# Patient Record
Sex: Female | Born: 1955
Health system: Southern US, Community
[De-identification: ages and names within clinical notes are randomized; demographics above are authoritative.]

## PROBLEM LIST (undated history)

## (undated) DIAGNOSIS — I10 Essential (primary) hypertension: Secondary | ICD-10-CM

## (undated) DIAGNOSIS — E785 Hyperlipidemia, unspecified: Secondary | ICD-10-CM

## (undated) DIAGNOSIS — I70229 Atherosclerosis of native arteries of extremities with rest pain, unspecified extremity: Secondary | ICD-10-CM

## (undated) DIAGNOSIS — Z681 Body mass index (BMI) 19 or less, adult: Secondary | ICD-10-CM

## (undated) HISTORY — DX: Essential (primary) hypertension: I10

## (undated) HISTORY — DX: Hyperlipidemia, unspecified: E78.5

## (undated) HISTORY — DX: Atherosclerosis of native arteries of extremities with rest pain, unspecified extremity: I70.229

## (undated) HISTORY — DX: Body mass index (BMI) 19.9 or less, adult: Z68.1

## (undated) HISTORY — PX: BRAIN SURGERY: SHX531

## (undated) HISTORY — PX: CEREBRAL ANEURYSM REPAIR: SHX164

---

## 1988-01-04 HISTORY — PX: TUBAL LIGATION: SHX77

## 2012-01-04 HISTORY — PX: BREAST SURGERY: SHX581

## 2012-03-21 DIAGNOSIS — R928 Other abnormal and inconclusive findings on diagnostic imaging of breast: Secondary | ICD-10-CM | POA: Diagnosis not present

## 2012-03-28 DIAGNOSIS — N63 Unspecified lump in unspecified breast: Secondary | ICD-10-CM | POA: Diagnosis not present

## 2012-03-30 DIAGNOSIS — R928 Other abnormal and inconclusive findings on diagnostic imaging of breast: Secondary | ICD-10-CM | POA: Diagnosis not present

## 2012-04-04 DIAGNOSIS — R920 Mammographic microcalcification found on diagnostic imaging of breast: Secondary | ICD-10-CM | POA: Diagnosis not present

## 2012-04-09 DIAGNOSIS — N6089 Other benign mammary dysplasias of unspecified breast: Secondary | ICD-10-CM | POA: Diagnosis not present

## 2012-04-09 DIAGNOSIS — I10 Essential (primary) hypertension: Secondary | ICD-10-CM | POA: Diagnosis not present

## 2012-04-09 DIAGNOSIS — R9431 Abnormal electrocardiogram [ECG] [EKG]: Secondary | ICD-10-CM | POA: Diagnosis not present

## 2012-04-09 DIAGNOSIS — N6019 Diffuse cystic mastopathy of unspecified breast: Secondary | ICD-10-CM | POA: Diagnosis not present

## 2012-04-09 DIAGNOSIS — R928 Other abnormal and inconclusive findings on diagnostic imaging of breast: Secondary | ICD-10-CM | POA: Diagnosis not present

## 2012-04-09 DIAGNOSIS — D249 Benign neoplasm of unspecified breast: Secondary | ICD-10-CM | POA: Diagnosis not present

## 2012-04-09 DIAGNOSIS — F17200 Nicotine dependence, unspecified, uncomplicated: Secondary | ICD-10-CM | POA: Diagnosis not present

## 2012-04-09 DIAGNOSIS — Z79899 Other long term (current) drug therapy: Secondary | ICD-10-CM | POA: Diagnosis not present

## 2012-04-09 DIAGNOSIS — R920 Mammographic microcalcification found on diagnostic imaging of breast: Secondary | ICD-10-CM | POA: Diagnosis not present

## 2012-05-24 DIAGNOSIS — I10 Essential (primary) hypertension: Secondary | ICD-10-CM | POA: Diagnosis not present

## 2012-06-22 DIAGNOSIS — H2589 Other age-related cataract: Secondary | ICD-10-CM | POA: Diagnosis not present

## 2012-09-25 DIAGNOSIS — R209 Unspecified disturbances of skin sensation: Secondary | ICD-10-CM | POA: Diagnosis not present

## 2012-09-25 DIAGNOSIS — M129 Arthropathy, unspecified: Secondary | ICD-10-CM | POA: Diagnosis not present

## 2012-09-25 DIAGNOSIS — I10 Essential (primary) hypertension: Secondary | ICD-10-CM | POA: Diagnosis not present

## 2012-10-04 DIAGNOSIS — S43109A Unspecified dislocation of unspecified acromioclavicular joint, initial encounter: Secondary | ICD-10-CM | POA: Diagnosis not present

## 2012-10-10 DIAGNOSIS — S43109A Unspecified dislocation of unspecified acromioclavicular joint, initial encounter: Secondary | ICD-10-CM | POA: Diagnosis not present

## 2012-10-17 DIAGNOSIS — S43109A Unspecified dislocation of unspecified acromioclavicular joint, initial encounter: Secondary | ICD-10-CM | POA: Diagnosis not present

## 2012-10-23 DIAGNOSIS — S43109A Unspecified dislocation of unspecified acromioclavicular joint, initial encounter: Secondary | ICD-10-CM | POA: Diagnosis not present

## 2012-10-26 DIAGNOSIS — S43109A Unspecified dislocation of unspecified acromioclavicular joint, initial encounter: Secondary | ICD-10-CM | POA: Diagnosis not present

## 2012-10-30 DIAGNOSIS — S43109A Unspecified dislocation of unspecified acromioclavicular joint, initial encounter: Secondary | ICD-10-CM | POA: Diagnosis not present

## 2012-11-01 DIAGNOSIS — S43109A Unspecified dislocation of unspecified acromioclavicular joint, initial encounter: Secondary | ICD-10-CM | POA: Diagnosis not present

## 2012-11-06 DIAGNOSIS — S43109A Unspecified dislocation of unspecified acromioclavicular joint, initial encounter: Secondary | ICD-10-CM | POA: Diagnosis not present

## 2012-11-08 DIAGNOSIS — S43109A Unspecified dislocation of unspecified acromioclavicular joint, initial encounter: Secondary | ICD-10-CM | POA: Diagnosis not present

## 2012-11-14 DIAGNOSIS — S43109A Unspecified dislocation of unspecified acromioclavicular joint, initial encounter: Secondary | ICD-10-CM | POA: Diagnosis not present

## 2012-11-15 DIAGNOSIS — L723 Sebaceous cyst: Secondary | ICD-10-CM | POA: Diagnosis not present

## 2012-11-15 DIAGNOSIS — I10 Essential (primary) hypertension: Secondary | ICD-10-CM | POA: Diagnosis not present

## 2012-11-16 DIAGNOSIS — S43109A Unspecified dislocation of unspecified acromioclavicular joint, initial encounter: Secondary | ICD-10-CM | POA: Diagnosis not present

## 2012-11-19 DIAGNOSIS — S43109A Unspecified dislocation of unspecified acromioclavicular joint, initial encounter: Secondary | ICD-10-CM | POA: Diagnosis not present

## 2012-11-22 DIAGNOSIS — M7989 Other specified soft tissue disorders: Secondary | ICD-10-CM | POA: Diagnosis not present

## 2012-11-26 DIAGNOSIS — R229 Localized swelling, mass and lump, unspecified: Secondary | ICD-10-CM | POA: Diagnosis not present

## 2012-11-26 DIAGNOSIS — M79609 Pain in unspecified limb: Secondary | ICD-10-CM | POA: Diagnosis not present

## 2012-12-03 DIAGNOSIS — L723 Sebaceous cyst: Secondary | ICD-10-CM | POA: Diagnosis not present

## 2012-12-03 DIAGNOSIS — M79609 Pain in unspecified limb: Secondary | ICD-10-CM | POA: Diagnosis not present

## 2012-12-03 DIAGNOSIS — I10 Essential (primary) hypertension: Secondary | ICD-10-CM | POA: Diagnosis not present

## 2012-12-03 DIAGNOSIS — M7989 Other specified soft tissue disorders: Secondary | ICD-10-CM | POA: Diagnosis not present

## 2012-12-03 DIAGNOSIS — D213 Benign neoplasm of connective and other soft tissue of thorax: Secondary | ICD-10-CM | POA: Diagnosis not present

## 2012-12-03 DIAGNOSIS — Z79899 Other long term (current) drug therapy: Secondary | ICD-10-CM | POA: Diagnosis not present

## 2012-12-03 DIAGNOSIS — F17200 Nicotine dependence, unspecified, uncomplicated: Secondary | ICD-10-CM | POA: Diagnosis not present

## 2012-12-03 DIAGNOSIS — R229 Localized swelling, mass and lump, unspecified: Secondary | ICD-10-CM | POA: Diagnosis not present

## 2012-12-03 DIAGNOSIS — Z853 Personal history of malignant neoplasm of breast: Secondary | ICD-10-CM | POA: Diagnosis not present

## 2013-01-01 DIAGNOSIS — M129 Arthropathy, unspecified: Secondary | ICD-10-CM | POA: Diagnosis not present

## 2013-01-01 DIAGNOSIS — I10 Essential (primary) hypertension: Secondary | ICD-10-CM | POA: Diagnosis not present

## 2013-01-01 DIAGNOSIS — Z79899 Other long term (current) drug therapy: Secondary | ICD-10-CM | POA: Diagnosis not present

## 2013-04-02 DIAGNOSIS — R0602 Shortness of breath: Secondary | ICD-10-CM | POA: Diagnosis not present

## 2013-04-02 DIAGNOSIS — IMO0002 Reserved for concepts with insufficient information to code with codable children: Secondary | ICD-10-CM | POA: Diagnosis not present

## 2013-04-02 DIAGNOSIS — M25569 Pain in unspecified knee: Secondary | ICD-10-CM | POA: Diagnosis not present

## 2013-04-02 DIAGNOSIS — E782 Mixed hyperlipidemia: Secondary | ICD-10-CM | POA: Diagnosis not present

## 2013-04-02 DIAGNOSIS — I10 Essential (primary) hypertension: Secondary | ICD-10-CM | POA: Diagnosis not present

## 2013-04-02 DIAGNOSIS — Z79899 Other long term (current) drug therapy: Secondary | ICD-10-CM | POA: Diagnosis not present

## 2013-04-02 DIAGNOSIS — R5381 Other malaise: Secondary | ICD-10-CM | POA: Diagnosis not present

## 2013-04-16 DIAGNOSIS — S31809A Unspecified open wound of unspecified buttock, initial encounter: Secondary | ICD-10-CM | POA: Diagnosis not present

## 2013-04-16 DIAGNOSIS — M25569 Pain in unspecified knee: Secondary | ICD-10-CM | POA: Diagnosis not present

## 2013-04-16 DIAGNOSIS — M1710 Unilateral primary osteoarthritis, unspecified knee: Secondary | ICD-10-CM | POA: Diagnosis not present

## 2013-04-16 DIAGNOSIS — IMO0002 Reserved for concepts with insufficient information to code with codable children: Secondary | ICD-10-CM | POA: Diagnosis not present

## 2013-04-16 DIAGNOSIS — S83509A Sprain of unspecified cruciate ligament of unspecified knee, initial encounter: Secondary | ICD-10-CM | POA: Diagnosis not present

## 2013-04-17 DIAGNOSIS — M949 Disorder of cartilage, unspecified: Secondary | ICD-10-CM | POA: Diagnosis not present

## 2013-04-17 DIAGNOSIS — M899 Disorder of bone, unspecified: Secondary | ICD-10-CM | POA: Diagnosis not present

## 2013-04-17 DIAGNOSIS — Z13820 Encounter for screening for osteoporosis: Secondary | ICD-10-CM | POA: Diagnosis not present

## 2013-04-17 DIAGNOSIS — Z9889 Other specified postprocedural states: Secondary | ICD-10-CM | POA: Diagnosis not present

## 2013-04-17 DIAGNOSIS — R928 Other abnormal and inconclusive findings on diagnostic imaging of breast: Secondary | ICD-10-CM | POA: Diagnosis not present

## 2013-04-17 DIAGNOSIS — R922 Inconclusive mammogram: Secondary | ICD-10-CM | POA: Diagnosis not present

## 2013-04-24 DIAGNOSIS — Z1239 Encounter for other screening for malignant neoplasm of breast: Secondary | ICD-10-CM | POA: Diagnosis not present

## 2013-05-02 DIAGNOSIS — S83509A Sprain of unspecified cruciate ligament of unspecified knee, initial encounter: Secondary | ICD-10-CM | POA: Diagnosis not present

## 2013-05-02 DIAGNOSIS — M25569 Pain in unspecified knee: Secondary | ICD-10-CM | POA: Diagnosis not present

## 2013-05-02 DIAGNOSIS — R269 Unspecified abnormalities of gait and mobility: Secondary | ICD-10-CM | POA: Diagnosis not present

## 2013-05-06 DIAGNOSIS — S83509A Sprain of unspecified cruciate ligament of unspecified knee, initial encounter: Secondary | ICD-10-CM | POA: Diagnosis not present

## 2013-05-06 DIAGNOSIS — R269 Unspecified abnormalities of gait and mobility: Secondary | ICD-10-CM | POA: Diagnosis not present

## 2013-05-06 DIAGNOSIS — M25569 Pain in unspecified knee: Secondary | ICD-10-CM | POA: Diagnosis not present

## 2013-05-08 DIAGNOSIS — M25569 Pain in unspecified knee: Secondary | ICD-10-CM | POA: Diagnosis not present

## 2013-05-08 DIAGNOSIS — R269 Unspecified abnormalities of gait and mobility: Secondary | ICD-10-CM | POA: Diagnosis not present

## 2013-05-08 DIAGNOSIS — S83509A Sprain of unspecified cruciate ligament of unspecified knee, initial encounter: Secondary | ICD-10-CM | POA: Diagnosis not present

## 2013-05-15 DIAGNOSIS — M25569 Pain in unspecified knee: Secondary | ICD-10-CM | POA: Diagnosis not present

## 2013-05-15 DIAGNOSIS — S83509A Sprain of unspecified cruciate ligament of unspecified knee, initial encounter: Secondary | ICD-10-CM | POA: Diagnosis not present

## 2013-05-15 DIAGNOSIS — R269 Unspecified abnormalities of gait and mobility: Secondary | ICD-10-CM | POA: Diagnosis not present

## 2013-05-17 DIAGNOSIS — S83509A Sprain of unspecified cruciate ligament of unspecified knee, initial encounter: Secondary | ICD-10-CM | POA: Diagnosis not present

## 2013-05-17 DIAGNOSIS — M25569 Pain in unspecified knee: Secondary | ICD-10-CM | POA: Diagnosis not present

## 2013-05-17 DIAGNOSIS — R269 Unspecified abnormalities of gait and mobility: Secondary | ICD-10-CM | POA: Diagnosis not present

## 2013-05-22 DIAGNOSIS — R269 Unspecified abnormalities of gait and mobility: Secondary | ICD-10-CM | POA: Diagnosis not present

## 2013-05-22 DIAGNOSIS — S83509A Sprain of unspecified cruciate ligament of unspecified knee, initial encounter: Secondary | ICD-10-CM | POA: Diagnosis not present

## 2013-05-22 DIAGNOSIS — M25569 Pain in unspecified knee: Secondary | ICD-10-CM | POA: Diagnosis not present

## 2013-05-24 DIAGNOSIS — M25569 Pain in unspecified knee: Secondary | ICD-10-CM | POA: Diagnosis not present

## 2013-05-24 DIAGNOSIS — R269 Unspecified abnormalities of gait and mobility: Secondary | ICD-10-CM | POA: Diagnosis not present

## 2013-05-24 DIAGNOSIS — S83509A Sprain of unspecified cruciate ligament of unspecified knee, initial encounter: Secondary | ICD-10-CM | POA: Diagnosis not present

## 2013-05-29 DIAGNOSIS — S83509A Sprain of unspecified cruciate ligament of unspecified knee, initial encounter: Secondary | ICD-10-CM | POA: Diagnosis not present

## 2013-05-29 DIAGNOSIS — S83419A Sprain of medial collateral ligament of unspecified knee, initial encounter: Secondary | ICD-10-CM | POA: Diagnosis not present

## 2013-05-29 DIAGNOSIS — M1990 Unspecified osteoarthritis, unspecified site: Secondary | ICD-10-CM | POA: Diagnosis not present

## 2013-05-30 DIAGNOSIS — M25569 Pain in unspecified knee: Secondary | ICD-10-CM | POA: Diagnosis not present

## 2013-05-30 DIAGNOSIS — R269 Unspecified abnormalities of gait and mobility: Secondary | ICD-10-CM | POA: Diagnosis not present

## 2013-05-30 DIAGNOSIS — S83509A Sprain of unspecified cruciate ligament of unspecified knee, initial encounter: Secondary | ICD-10-CM | POA: Diagnosis not present

## 2013-06-03 DIAGNOSIS — R269 Unspecified abnormalities of gait and mobility: Secondary | ICD-10-CM | POA: Diagnosis not present

## 2013-06-03 DIAGNOSIS — M25569 Pain in unspecified knee: Secondary | ICD-10-CM | POA: Diagnosis not present

## 2013-06-03 DIAGNOSIS — S83509A Sprain of unspecified cruciate ligament of unspecified knee, initial encounter: Secondary | ICD-10-CM | POA: Diagnosis not present

## 2013-06-05 DIAGNOSIS — M25569 Pain in unspecified knee: Secondary | ICD-10-CM | POA: Diagnosis not present

## 2013-06-05 DIAGNOSIS — R269 Unspecified abnormalities of gait and mobility: Secondary | ICD-10-CM | POA: Diagnosis not present

## 2013-06-05 DIAGNOSIS — S83509A Sprain of unspecified cruciate ligament of unspecified knee, initial encounter: Secondary | ICD-10-CM | POA: Diagnosis not present

## 2013-06-11 DIAGNOSIS — S83509A Sprain of unspecified cruciate ligament of unspecified knee, initial encounter: Secondary | ICD-10-CM | POA: Diagnosis not present

## 2013-06-11 DIAGNOSIS — R269 Unspecified abnormalities of gait and mobility: Secondary | ICD-10-CM | POA: Diagnosis not present

## 2013-06-11 DIAGNOSIS — M25569 Pain in unspecified knee: Secondary | ICD-10-CM | POA: Diagnosis not present

## 2013-06-14 DIAGNOSIS — M25569 Pain in unspecified knee: Secondary | ICD-10-CM | POA: Diagnosis not present

## 2013-06-14 DIAGNOSIS — R269 Unspecified abnormalities of gait and mobility: Secondary | ICD-10-CM | POA: Diagnosis not present

## 2013-06-14 DIAGNOSIS — S83509A Sprain of unspecified cruciate ligament of unspecified knee, initial encounter: Secondary | ICD-10-CM | POA: Diagnosis not present

## 2013-06-18 DIAGNOSIS — R269 Unspecified abnormalities of gait and mobility: Secondary | ICD-10-CM | POA: Diagnosis not present

## 2013-06-18 DIAGNOSIS — S83509A Sprain of unspecified cruciate ligament of unspecified knee, initial encounter: Secondary | ICD-10-CM | POA: Diagnosis not present

## 2013-06-18 DIAGNOSIS — M25569 Pain in unspecified knee: Secondary | ICD-10-CM | POA: Diagnosis not present

## 2013-06-27 DIAGNOSIS — M25569 Pain in unspecified knee: Secondary | ICD-10-CM | POA: Diagnosis not present

## 2013-06-27 DIAGNOSIS — R269 Unspecified abnormalities of gait and mobility: Secondary | ICD-10-CM | POA: Diagnosis not present

## 2013-06-27 DIAGNOSIS — S83509A Sprain of unspecified cruciate ligament of unspecified knee, initial encounter: Secondary | ICD-10-CM | POA: Diagnosis not present

## 2013-07-02 DIAGNOSIS — Z79899 Other long term (current) drug therapy: Secondary | ICD-10-CM | POA: Diagnosis not present

## 2013-07-02 DIAGNOSIS — I10 Essential (primary) hypertension: Secondary | ICD-10-CM | POA: Diagnosis not present

## 2013-07-02 DIAGNOSIS — M129 Arthropathy, unspecified: Secondary | ICD-10-CM | POA: Diagnosis not present

## 2013-07-02 DIAGNOSIS — R0602 Shortness of breath: Secondary | ICD-10-CM | POA: Diagnosis not present

## 2013-07-02 DIAGNOSIS — E782 Mixed hyperlipidemia: Secondary | ICD-10-CM | POA: Diagnosis not present

## 2013-07-02 DIAGNOSIS — F17200 Nicotine dependence, unspecified, uncomplicated: Secondary | ICD-10-CM | POA: Diagnosis not present

## 2013-07-03 DIAGNOSIS — H524 Presbyopia: Secondary | ICD-10-CM | POA: Diagnosis not present

## 2013-07-03 DIAGNOSIS — H2589 Other age-related cataract: Secondary | ICD-10-CM | POA: Diagnosis not present

## 2013-08-29 DIAGNOSIS — S83419A Sprain of medial collateral ligament of unspecified knee, initial encounter: Secondary | ICD-10-CM | POA: Diagnosis not present

## 2013-08-29 DIAGNOSIS — M1990 Unspecified osteoarthritis, unspecified site: Secondary | ICD-10-CM | POA: Diagnosis not present

## 2013-08-29 DIAGNOSIS — S83509A Sprain of unspecified cruciate ligament of unspecified knee, initial encounter: Secondary | ICD-10-CM | POA: Diagnosis not present

## 2013-10-02 DIAGNOSIS — Z79899 Other long term (current) drug therapy: Secondary | ICD-10-CM | POA: Diagnosis not present

## 2013-10-02 DIAGNOSIS — R0602 Shortness of breath: Secondary | ICD-10-CM | POA: Diagnosis not present

## 2013-10-02 DIAGNOSIS — I10 Essential (primary) hypertension: Secondary | ICD-10-CM | POA: Diagnosis not present

## 2013-10-02 DIAGNOSIS — L259 Unspecified contact dermatitis, unspecified cause: Secondary | ICD-10-CM | POA: Diagnosis not present

## 2013-10-02 DIAGNOSIS — E782 Mixed hyperlipidemia: Secondary | ICD-10-CM | POA: Diagnosis not present

## 2013-10-28 DIAGNOSIS — I10 Essential (primary) hypertension: Secondary | ICD-10-CM | POA: Diagnosis not present

## 2013-10-28 DIAGNOSIS — L209 Atopic dermatitis, unspecified: Secondary | ICD-10-CM | POA: Diagnosis not present

## 2013-10-28 DIAGNOSIS — Z720 Tobacco use: Secondary | ICD-10-CM | POA: Diagnosis not present

## 2013-10-28 DIAGNOSIS — E780 Pure hypercholesterolemia: Secondary | ICD-10-CM | POA: Diagnosis not present

## 2013-10-28 DIAGNOSIS — Z79899 Other long term (current) drug therapy: Secondary | ICD-10-CM | POA: Diagnosis not present

## 2013-11-05 ENCOUNTER — Encounter (HOSPITAL_COMMUNITY): Payer: Self-pay | Admitting: Emergency Medicine

## 2013-11-05 ENCOUNTER — Emergency Department (HOSPITAL_COMMUNITY): Payer: Medicare Other

## 2013-11-05 ENCOUNTER — Emergency Department (HOSPITAL_COMMUNITY)
Admission: EM | Admit: 2013-11-05 | Discharge: 2013-11-05 | Disposition: A | Discharge status: Home / Self Care | Payer: Medicare Other | Attending: Emergency Medicine | Admitting: Emergency Medicine

## 2013-11-05 DIAGNOSIS — R220 Localized swelling, mass and lump, head: Secondary | ICD-10-CM | POA: Diagnosis not present

## 2013-11-05 DIAGNOSIS — H05223 Edema of bilateral orbit: Secondary | ICD-10-CM | POA: Diagnosis not present

## 2013-11-05 DIAGNOSIS — R51 Headache: Secondary | ICD-10-CM | POA: Diagnosis not present

## 2013-11-05 DIAGNOSIS — H5789 Other specified disorders of eye and adnexa: Secondary | ICD-10-CM

## 2013-11-05 DIAGNOSIS — R519 Headache, unspecified: Secondary | ICD-10-CM

## 2013-11-05 DIAGNOSIS — H0589 Other disorders of orbit: Secondary | ICD-10-CM | POA: Diagnosis not present

## 2013-11-05 LAB — CBC WITH DIFFERENTIAL/PLATELET
BASOS PCT: 0 % (ref 0–1)
Basophils Absolute: 0 10*3/uL (ref 0.0–0.1)
Eosinophils Absolute: 0.1 10*3/uL (ref 0.0–0.7)
Eosinophils Relative: 1 % (ref 0–5)
HCT: 43.4 % (ref 36.0–46.0)
Hemoglobin: 14.8 g/dL (ref 12.0–15.0)
LYMPHS ABS: 1.9 10*3/uL (ref 0.7–4.0)
Lymphocytes Relative: 28 % (ref 12–46)
MCH: 31.7 pg (ref 26.0–34.0)
MCHC: 34.1 g/dL (ref 30.0–36.0)
MCV: 92.9 fL (ref 78.0–100.0)
Monocytes Absolute: 0.7 10*3/uL (ref 0.1–1.0)
Monocytes Relative: 11 % (ref 3–12)
NEUTROS PCT: 60 % (ref 43–77)
Neutro Abs: 3.9 10*3/uL (ref 1.7–7.7)
PLATELETS: 160 10*3/uL (ref 150–400)
RBC: 4.67 MIL/uL (ref 3.87–5.11)
RDW: 12.2 % (ref 11.5–15.5)
WBC: 6.6 10*3/uL (ref 4.0–10.5)

## 2013-11-05 LAB — BASIC METABOLIC PANEL
Anion gap: 12 (ref 5–15)
BUN: 8 mg/dL (ref 6–23)
CO2: 25 mEq/L (ref 19–32)
Calcium: 9.8 mg/dL (ref 8.4–10.5)
Chloride: 103 mEq/L (ref 96–112)
Creatinine, Ser: 0.75 mg/dL (ref 0.50–1.10)
GFR calc Af Amer: >90 mL/min (ref >90)
Glucose, Bld: 98 mg/dL (ref 70–99)
Potassium: 4 mEq/L (ref 3.7–5.3)
SODIUM: 140 meq/L (ref 137–147)

## 2013-11-05 NOTE — ED Provider Notes (Signed)
CSN: [id number]     Arrival date & time 11/05/13  1051 History  This chart was scribed for non-physician practitioner, Alvina Chou, PA-C, working with Sharyon Cable, MD by Lowella Petties, ED Scribe. The patient was seen in room TR11C/TR11C. Patient's care was started at 11:16 AM.   Chief Complaint  Patient presents with  . Facial Swelling   The history is provided by the patient and a relative. No language interpreter was used.   HPI Comments: Katherine Archer is a 58 y.o. female who presents to the Emergency Department complaining of periorbital facial swelling onset two months ago. She states that the swelling around her eyes and cheeks becomes increasingly swollen to the point of purple discoloration, then ruptures leaking blood. She reports associated left-sided headaches. She has seen her PCP multiple times for this over the past two months and has taken various ABX targeted at a dermatologic cause. She has taken this medication as prescribed without relief. She denies any agrivating symptoms. She denies vision changes, numbness, or tingling. She reports a history of a brain aneurysm, and her daughter states that she has a metal plate in her brain from previous surgery.   No past medical history on file. No past surgical history on file. No family history on file. History  Substance Use Topics  . Smoking status: Not on file  . Smokeless tobacco: Not on file  . Alcohol Use: Not on file   OB History    No data available     Review of Systems  HENT: Positive for facial swelling (periorbital).   Skin: Positive for color change (periorbital).  All other systems reviewed and are negative.   Allergies  Review of patient's allergies indicates not on file.  Home Medications   Prior to Admission medications   Not on File   Triage Vitals: BP 148/78 mmHg  Pulse 92  Temp(Src) 98.1 F (36.7 C) (Oral)  Resp 17  Ht 5\' 6"  (1.676 m)  Wt 128 lb (58.06 kg)  BMI 20.67 kg/m2  SpO2  95% Physical Exam  Constitutional: She is oriented to person, place, and time. She appears well-developed and well-nourished. No distress.  HENT:  Head: Normocephalic and atraumatic.  Mouth/Throat: Oropharynx is clear and moist. No oropharyngeal exudate.  Bilateral periorbital swelling that extends along zygomatic processes that is fluctuant and mild tender to palpation. No open wound or drainage noted. There is mild purplish, hyperpigmentation discoloration of the associated skin. Patient reports when I palpate the left periorbital area, her left temporal head aches.   Eyes: Conjunctivae and EOM are normal. Pupils are equal, round, and reactive to light.  Neck: Normal range of motion.  Cardiovascular: Normal rate and regular rhythm.  Exam reveals no gallop and no friction rub.   No murmur heard. Pulmonary/Chest: Effort normal and breath sounds normal. She has no wheezes. She has no rales. She exhibits no tenderness.  Abdominal: Soft. There is no tenderness.  Musculoskeletal: Normal range of motion.  Neurological: She is alert and oriented to person, place, and time. No cranial nerve deficit. Coordination normal.  Extremity strength and sensation equal and intact bilaterally. Patient is able to ambulate without difficulty. Speech is goal-oriented. Moves limbs without ataxia.   Skin: Skin is warm and dry.  Psychiatric: She has a normal mood and affect. Her behavior is normal.  Nursing note and vitals reviewed.   ED Course  Procedures (including critical care time) DIAGNOSTIC STUDIES: Oxygen Saturation is 95% on room air, adequate  by my interpretation.    COORDINATION OF CARE: 11:21 AM-Discussed treatment plan which includes head CT-scan and lab work with pt at bedside and pt agreed to plan.   Labs Review Labs Reviewed  CBC WITH DIFFERENTIAL  BASIC METABOLIC PANEL    Imaging Review Ct Head Wo Contrast  11/05/2013   CLINICAL DATA:  Periorbital swelling for 2 months without response  to antibiotics. Left-sided headaches. Personal history of cerebral aneurysm repair 1998.  EXAM: CT HEAD WITHOUT CONTRAST  CT MAXILLOFACIAL WITHOUT CONTRAST  TECHNIQUE: Multidetector CT imaging of the head and maxillofacial structures were performed using the standard protocol without intravenous contrast. Multiplanar CT image reconstructions of the maxillofacial structures were also generated.  COMPARISON:  None.  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 Encephalomalacia is noted in the left frontal lobe. No acute cortical infarct, hemorrhage, or mass lesion is present. The ventricles are of normal size with some ex vacuo dilation of the frontal horn left lateral ventricle.  Mild mucosal thickening is scattered through the ethmoid air cells. The paranasal sinuses and mastoid air cells are otherwise clear.  CT MAXILLOFACIAL FINDINGS  Mild soft tissue swelling is present in the upper eyelids bilaterally. There is some infraorbital soft tissue swelling is well. There is no mass lesion. The globes and orbits are intact. The postseptal orbits are within normal limits bilaterally. The lacrimal gland is within normal limits bilaterally as well.  Degenerative changes are present in the upper cervical spine with the endplate change in uncovertebral disease bilaterally at C4-5 and left greater than right at C3-4. Ankle oasis is evident at C6-7.  IMPRESSION: 1. Preseptal periorbital soft tissue swelling is noted bilaterally without a discrete abscess or focal etiology. 2. The orbits are otherwise normal. 3. Encephalomalacia of the left frontal lobe is likely remote in associated with the craniotomy for aneurysm clipping. 4. No acute intracranial abnormality. 5. Degenerative changes of the upper cervical spine.   Electronically Signed   By: Lawrence Santiago M.D.   On:  11/05/2013 12:05   Ct Maxillofacial Wo Cm  11/05/2013   CLINICAL DATA:  Periorbital swelling for 2 months without response to antibiotics. Left-sided headaches. Personal history of cerebral aneurysm repair 1998.  EXAM: CT HEAD WITHOUT CONTRAST  CT MAXILLOFACIAL WITHOUT CONTRAST  TECHNIQUE: Multidetector CT imaging of the head and maxillofacial structures were performed using the standard protocol without intravenous contrast. Multiplanar CT image reconstructions of the maxillofacial structures were also generated.  COMPARISON:  None.  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 Encephalomalacia is noted in the left frontal lobe. No acute cortical infarct, hemorrhage, or mass lesion is present. The ventricles are of normal size with some ex vacuo dilation of the frontal horn left lateral ventricle.  Mild mucosal thickening is scattered through the ethmoid air cells. The paranasal sinuses and mastoid air cells are otherwise clear.  CT MAXILLOFACIAL FINDINGS  Mild soft tissue swelling is present in the upper eyelids bilaterally. There is some infraorbital soft tissue swelling is well. There is no mass lesion. The globes and orbits are intact. The postseptal orbits are within normal limits bilaterally. The lacrimal gland is within normal limits bilaterally as well.  Degenerative changes are present in the upper cervical spine  with the endplate change in uncovertebral disease bilaterally at C4-5 and left greater than right at C3-4. Ankle oasis is evident at C6-7.  IMPRESSION: 1. Preseptal periorbital soft tissue swelling is noted bilaterally without a discrete abscess or focal etiology. 2. The orbits are otherwise normal. 3. Encephalomalacia of the left frontal lobe is likely remote in associated with the craniotomy for aneurysm clipping. 4. No acute intracranial  abnormality. 5. Degenerative changes of the upper cervical spine.   Electronically Signed   By: Lawrence Santiago M.D.   On: 11/05/2013 12:05     EKG Interpretation None     MDM   Final diagnoses:  Periorbital swelling    12:48 PM Patient has no neuro deficits at this time. Patient's head and face CT show no acute changes. Labs unremarkable. Patient's symptoms likely dermatologic. I will refer the patient to dermatologist. Vitals stable and patient afebrile.   I personally performed the services described in this documentation, which was scribed in my presence. The recorded information has been reviewed and is accurate.   Alvina Chou, PA-C 11/05/13 1249

## 2013-11-05 NOTE — ED Notes (Signed)
Pt c/o periorbital and facial swelling x 2 months. Has seen PCP about this and given abx but no improvement. States facial lesions "swell up and burst, draining blood". Daughter demanding "blood be drawn".

## 2013-11-05 NOTE — Discharge Instructions (Signed)
Follow up with the recommended Dermatologist for further evaluation of your facial swelling. Return to the ED with worsening or concerning symptoms.

## 2013-11-15 DIAGNOSIS — L300 Nummular dermatitis: Secondary | ICD-10-CM | POA: Diagnosis not present

## 2013-11-15 DIAGNOSIS — H01114 Allergic dermatitis of left upper eyelid: Secondary | ICD-10-CM | POA: Diagnosis not present

## 2013-11-15 DIAGNOSIS — H01111 Allergic dermatitis of right upper eyelid: Secondary | ICD-10-CM | POA: Diagnosis not present

## 2013-11-15 DIAGNOSIS — L299 Pruritus, unspecified: Secondary | ICD-10-CM | POA: Diagnosis not present

## 2013-11-25 DIAGNOSIS — M1712 Unilateral primary osteoarthritis, left knee: Secondary | ICD-10-CM | POA: Diagnosis not present

## 2013-11-25 DIAGNOSIS — S83419A Sprain of medial collateral ligament of unspecified knee, initial encounter: Secondary | ICD-10-CM | POA: Diagnosis not present

## 2013-11-25 DIAGNOSIS — S83519A Sprain of anterior cruciate ligament of unspecified knee, initial encounter: Secondary | ICD-10-CM | POA: Diagnosis not present

## 2013-12-02 DIAGNOSIS — L300 Nummular dermatitis: Secondary | ICD-10-CM | POA: Diagnosis not present

## 2014-01-01 DIAGNOSIS — M1990 Unspecified osteoarthritis, unspecified site: Secondary | ICD-10-CM | POA: Diagnosis not present

## 2014-01-01 DIAGNOSIS — R0602 Shortness of breath: Secondary | ICD-10-CM | POA: Diagnosis not present

## 2014-01-01 DIAGNOSIS — L02416 Cutaneous abscess of left lower limb: Secondary | ICD-10-CM | POA: Diagnosis not present

## 2014-01-01 DIAGNOSIS — I10 Essential (primary) hypertension: Secondary | ICD-10-CM | POA: Diagnosis not present

## 2014-01-01 DIAGNOSIS — Z79899 Other long term (current) drug therapy: Secondary | ICD-10-CM | POA: Diagnosis not present

## 2014-01-01 DIAGNOSIS — L300 Nummular dermatitis: Secondary | ICD-10-CM | POA: Diagnosis not present

## 2014-01-01 DIAGNOSIS — M545 Low back pain: Secondary | ICD-10-CM | POA: Diagnosis not present

## 2014-01-01 DIAGNOSIS — Z681 Body mass index (BMI) 19 or less, adult: Secondary | ICD-10-CM | POA: Diagnosis not present

## 2014-01-01 DIAGNOSIS — E782 Mixed hyperlipidemia: Secondary | ICD-10-CM | POA: Diagnosis not present

## 2014-02-27 DIAGNOSIS — L280 Lichen simplex chronicus: Secondary | ICD-10-CM | POA: Diagnosis not present

## 2014-04-21 DIAGNOSIS — Z1231 Encounter for screening mammogram for malignant neoplasm of breast: Secondary | ICD-10-CM | POA: Diagnosis not present

## 2014-05-07 DIAGNOSIS — Z1239 Encounter for other screening for malignant neoplasm of breast: Secondary | ICD-10-CM | POA: Diagnosis not present

## 2014-06-23 DIAGNOSIS — Z6821 Body mass index (BMI) 21.0-21.9, adult: Secondary | ICD-10-CM | POA: Diagnosis not present

## 2014-06-23 DIAGNOSIS — Z1389 Encounter for screening for other disorder: Secondary | ICD-10-CM | POA: Diagnosis not present

## 2014-06-23 DIAGNOSIS — M1990 Unspecified osteoarthritis, unspecified site: Secondary | ICD-10-CM | POA: Diagnosis not present

## 2014-06-23 DIAGNOSIS — R0602 Shortness of breath: Secondary | ICD-10-CM | POA: Diagnosis not present

## 2014-06-23 DIAGNOSIS — Z23 Encounter for immunization: Secondary | ICD-10-CM | POA: Diagnosis not present

## 2014-06-23 DIAGNOSIS — I10 Essential (primary) hypertension: Secondary | ICD-10-CM | POA: Diagnosis not present

## 2014-06-23 DIAGNOSIS — Z79899 Other long term (current) drug therapy: Secondary | ICD-10-CM | POA: Diagnosis not present

## 2014-06-23 DIAGNOSIS — E782 Mixed hyperlipidemia: Secondary | ICD-10-CM | POA: Diagnosis not present

## 2014-12-24 DIAGNOSIS — J449 Chronic obstructive pulmonary disease, unspecified: Secondary | ICD-10-CM | POA: Diagnosis not present

## 2014-12-24 DIAGNOSIS — I10 Essential (primary) hypertension: Secondary | ICD-10-CM | POA: Diagnosis not present

## 2014-12-24 DIAGNOSIS — E782 Mixed hyperlipidemia: Secondary | ICD-10-CM | POA: Diagnosis not present

## 2014-12-24 DIAGNOSIS — R0602 Shortness of breath: Secondary | ICD-10-CM | POA: Diagnosis not present

## 2014-12-24 DIAGNOSIS — M545 Low back pain: Secondary | ICD-10-CM | POA: Diagnosis not present

## 2014-12-24 DIAGNOSIS — F17200 Nicotine dependence, unspecified, uncomplicated: Secondary | ICD-10-CM | POA: Diagnosis not present

## 2014-12-24 DIAGNOSIS — Z6821 Body mass index (BMI) 21.0-21.9, adult: Secondary | ICD-10-CM | POA: Diagnosis not present

## 2014-12-24 DIAGNOSIS — Z79899 Other long term (current) drug therapy: Secondary | ICD-10-CM | POA: Diagnosis not present

## 2014-12-24 DIAGNOSIS — Z1389 Encounter for screening for other disorder: Secondary | ICD-10-CM | POA: Diagnosis not present

## 2014-12-24 DIAGNOSIS — Z720 Tobacco use: Secondary | ICD-10-CM | POA: Diagnosis not present

## 2015-04-22 DIAGNOSIS — Z1231 Encounter for screening mammogram for malignant neoplasm of breast: Secondary | ICD-10-CM | POA: Diagnosis not present

## 2015-04-29 DIAGNOSIS — N6011 Diffuse cystic mastopathy of right breast: Secondary | ICD-10-CM | POA: Insufficient documentation

## 2015-04-29 DIAGNOSIS — N6012 Diffuse cystic mastopathy of left breast: Secondary | ICD-10-CM | POA: Diagnosis not present

## 2015-04-29 HISTORY — DX: Diffuse cystic mastopathy of right breast: N60.11

## 2015-06-24 DIAGNOSIS — J449 Chronic obstructive pulmonary disease, unspecified: Secondary | ICD-10-CM | POA: Diagnosis not present

## 2015-06-24 DIAGNOSIS — Z23 Encounter for immunization: Secondary | ICD-10-CM | POA: Diagnosis not present

## 2015-06-24 DIAGNOSIS — R0602 Shortness of breath: Secondary | ICD-10-CM | POA: Diagnosis not present

## 2015-06-24 DIAGNOSIS — I10 Essential (primary) hypertension: Secondary | ICD-10-CM | POA: Diagnosis not present

## 2015-06-24 DIAGNOSIS — Z6821 Body mass index (BMI) 21.0-21.9, adult: Secondary | ICD-10-CM | POA: Diagnosis not present

## 2015-06-24 DIAGNOSIS — E782 Mixed hyperlipidemia: Secondary | ICD-10-CM | POA: Diagnosis not present

## 2015-06-24 DIAGNOSIS — M8580 Other specified disorders of bone density and structure, unspecified site: Secondary | ICD-10-CM | POA: Diagnosis not present

## 2015-06-24 DIAGNOSIS — Z79899 Other long term (current) drug therapy: Secondary | ICD-10-CM | POA: Diagnosis not present

## 2015-07-01 DIAGNOSIS — M8589 Other specified disorders of bone density and structure, multiple sites: Secondary | ICD-10-CM | POA: Diagnosis not present

## 2015-07-01 DIAGNOSIS — M810 Age-related osteoporosis without current pathological fracture: Secondary | ICD-10-CM | POA: Diagnosis not present

## 2015-12-24 DIAGNOSIS — I10 Essential (primary) hypertension: Secondary | ICD-10-CM | POA: Diagnosis not present

## 2015-12-24 DIAGNOSIS — J449 Chronic obstructive pulmonary disease, unspecified: Secondary | ICD-10-CM | POA: Diagnosis not present

## 2015-12-24 DIAGNOSIS — M1990 Unspecified osteoarthritis, unspecified site: Secondary | ICD-10-CM | POA: Diagnosis not present

## 2015-12-24 DIAGNOSIS — Z6822 Body mass index (BMI) 22.0-22.9, adult: Secondary | ICD-10-CM | POA: Diagnosis not present

## 2015-12-24 DIAGNOSIS — M810 Age-related osteoporosis without current pathological fracture: Secondary | ICD-10-CM | POA: Diagnosis not present

## 2015-12-24 DIAGNOSIS — E782 Mixed hyperlipidemia: Secondary | ICD-10-CM | POA: Diagnosis not present

## 2015-12-24 DIAGNOSIS — Z79899 Other long term (current) drug therapy: Secondary | ICD-10-CM | POA: Diagnosis not present

## 2016-04-25 DIAGNOSIS — Z1231 Encounter for screening mammogram for malignant neoplasm of breast: Secondary | ICD-10-CM | POA: Diagnosis not present

## 2016-05-05 DIAGNOSIS — Z1231 Encounter for screening mammogram for malignant neoplasm of breast: Secondary | ICD-10-CM

## 2016-05-05 HISTORY — DX: Encounter for screening mammogram for malignant neoplasm of breast: Z12.31

## 2016-05-20 DIAGNOSIS — Z0000 Encounter for general adult medical examination without abnormal findings: Secondary | ICD-10-CM | POA: Diagnosis not present

## 2016-05-20 DIAGNOSIS — Z0100 Encounter for examination of eyes and vision without abnormal findings: Secondary | ICD-10-CM | POA: Diagnosis not present

## 2016-05-20 DIAGNOSIS — Z1389 Encounter for screening for other disorder: Secondary | ICD-10-CM | POA: Diagnosis not present

## 2016-05-20 DIAGNOSIS — E785 Hyperlipidemia, unspecified: Secondary | ICD-10-CM | POA: Diagnosis not present

## 2016-05-20 DIAGNOSIS — Z1211 Encounter for screening for malignant neoplasm of colon: Secondary | ICD-10-CM | POA: Diagnosis not present

## 2016-05-20 DIAGNOSIS — Z9181 History of falling: Secondary | ICD-10-CM | POA: Diagnosis not present

## 2016-06-08 DIAGNOSIS — H2513 Age-related nuclear cataract, bilateral: Secondary | ICD-10-CM | POA: Diagnosis not present

## 2016-06-08 DIAGNOSIS — H524 Presbyopia: Secondary | ICD-10-CM | POA: Diagnosis not present

## 2016-06-15 DIAGNOSIS — D120 Benign neoplasm of cecum: Secondary | ICD-10-CM | POA: Diagnosis not present

## 2016-06-15 DIAGNOSIS — Z1211 Encounter for screening for malignant neoplasm of colon: Secondary | ICD-10-CM | POA: Diagnosis not present

## 2016-06-15 DIAGNOSIS — K635 Polyp of colon: Secondary | ICD-10-CM | POA: Diagnosis not present

## 2016-06-23 DIAGNOSIS — Z6822 Body mass index (BMI) 22.0-22.9, adult: Secondary | ICD-10-CM | POA: Diagnosis not present

## 2016-06-23 DIAGNOSIS — J449 Chronic obstructive pulmonary disease, unspecified: Secondary | ICD-10-CM | POA: Diagnosis not present

## 2016-06-23 DIAGNOSIS — M1990 Unspecified osteoarthritis, unspecified site: Secondary | ICD-10-CM | POA: Diagnosis not present

## 2016-06-23 DIAGNOSIS — I10 Essential (primary) hypertension: Secondary | ICD-10-CM | POA: Diagnosis not present

## 2016-06-23 DIAGNOSIS — M810 Age-related osteoporosis without current pathological fracture: Secondary | ICD-10-CM | POA: Diagnosis not present

## 2016-06-23 DIAGNOSIS — E782 Mixed hyperlipidemia: Secondary | ICD-10-CM | POA: Diagnosis not present

## 2016-06-23 DIAGNOSIS — Z79899 Other long term (current) drug therapy: Secondary | ICD-10-CM | POA: Diagnosis not present

## 2016-12-23 DIAGNOSIS — F17200 Nicotine dependence, unspecified, uncomplicated: Secondary | ICD-10-CM | POA: Diagnosis not present

## 2016-12-23 DIAGNOSIS — M810 Age-related osteoporosis without current pathological fracture: Secondary | ICD-10-CM | POA: Diagnosis not present

## 2016-12-23 DIAGNOSIS — Z79899 Other long term (current) drug therapy: Secondary | ICD-10-CM | POA: Diagnosis not present

## 2016-12-23 DIAGNOSIS — J449 Chronic obstructive pulmonary disease, unspecified: Secondary | ICD-10-CM | POA: Diagnosis not present

## 2016-12-23 DIAGNOSIS — Z6822 Body mass index (BMI) 22.0-22.9, adult: Secondary | ICD-10-CM | POA: Diagnosis not present

## 2016-12-23 DIAGNOSIS — I10 Essential (primary) hypertension: Secondary | ICD-10-CM | POA: Diagnosis not present

## 2016-12-23 DIAGNOSIS — E782 Mixed hyperlipidemia: Secondary | ICD-10-CM | POA: Diagnosis not present

## 2017-04-28 DIAGNOSIS — Z1231 Encounter for screening mammogram for malignant neoplasm of breast: Secondary | ICD-10-CM | POA: Diagnosis not present

## 2017-05-10 DIAGNOSIS — Z1239 Encounter for other screening for malignant neoplasm of breast: Secondary | ICD-10-CM | POA: Diagnosis not present

## 2017-07-24 DIAGNOSIS — E785 Hyperlipidemia, unspecified: Secondary | ICD-10-CM | POA: Diagnosis not present

## 2017-07-24 DIAGNOSIS — M810 Age-related osteoporosis without current pathological fracture: Secondary | ICD-10-CM | POA: Diagnosis not present

## 2017-07-24 DIAGNOSIS — Z131 Encounter for screening for diabetes mellitus: Secondary | ICD-10-CM | POA: Diagnosis not present

## 2017-07-24 DIAGNOSIS — Z7189 Other specified counseling: Secondary | ICD-10-CM | POA: Diagnosis not present

## 2017-08-14 DIAGNOSIS — E785 Hyperlipidemia, unspecified: Secondary | ICD-10-CM | POA: Diagnosis not present

## 2017-08-14 DIAGNOSIS — Z6821 Body mass index (BMI) 21.0-21.9, adult: Secondary | ICD-10-CM | POA: Diagnosis not present

## 2018-01-22 DIAGNOSIS — Z0000 Encounter for general adult medical examination without abnormal findings: Secondary | ICD-10-CM | POA: Diagnosis not present

## 2018-01-22 DIAGNOSIS — Z6821 Body mass index (BMI) 21.0-21.9, adult: Secondary | ICD-10-CM | POA: Diagnosis not present

## 2018-01-22 DIAGNOSIS — Z1331 Encounter for screening for depression: Secondary | ICD-10-CM | POA: Diagnosis not present

## 2018-01-22 DIAGNOSIS — M810 Age-related osteoporosis without current pathological fracture: Secondary | ICD-10-CM | POA: Diagnosis not present

## 2018-01-22 DIAGNOSIS — Z139 Encounter for screening, unspecified: Secondary | ICD-10-CM | POA: Diagnosis not present

## 2018-01-22 DIAGNOSIS — E785 Hyperlipidemia, unspecified: Secondary | ICD-10-CM | POA: Diagnosis not present

## 2018-03-02 DIAGNOSIS — M810 Age-related osteoporosis without current pathological fracture: Secondary | ICD-10-CM | POA: Diagnosis not present

## 2018-03-09 DIAGNOSIS — R030 Elevated blood-pressure reading, without diagnosis of hypertension: Secondary | ICD-10-CM | POA: Diagnosis not present

## 2018-03-09 DIAGNOSIS — Z6821 Body mass index (BMI) 21.0-21.9, adult: Secondary | ICD-10-CM | POA: Diagnosis not present

## 2018-03-09 DIAGNOSIS — M810 Age-related osteoporosis without current pathological fracture: Secondary | ICD-10-CM | POA: Diagnosis not present

## 2018-03-19 DIAGNOSIS — M810 Age-related osteoporosis without current pathological fracture: Secondary | ICD-10-CM | POA: Diagnosis not present

## 2018-05-25 DIAGNOSIS — Z1231 Encounter for screening mammogram for malignant neoplasm of breast: Secondary | ICD-10-CM | POA: Diagnosis not present

## 2018-06-28 DIAGNOSIS — Z1239 Encounter for other screening for malignant neoplasm of breast: Secondary | ICD-10-CM | POA: Diagnosis not present

## 2018-07-16 DIAGNOSIS — E785 Hyperlipidemia, unspecified: Secondary | ICD-10-CM | POA: Diagnosis not present

## 2018-07-23 DIAGNOSIS — E785 Hyperlipidemia, unspecified: Secondary | ICD-10-CM | POA: Diagnosis not present

## 2018-07-23 DIAGNOSIS — Z6822 Body mass index (BMI) 22.0-22.9, adult: Secondary | ICD-10-CM | POA: Diagnosis not present

## 2018-08-13 DIAGNOSIS — N841 Polyp of cervix uteri: Secondary | ICD-10-CM | POA: Diagnosis not present

## 2018-08-13 DIAGNOSIS — Z1211 Encounter for screening for malignant neoplasm of colon: Secondary | ICD-10-CM | POA: Diagnosis not present

## 2018-08-13 DIAGNOSIS — Z01419 Encounter for gynecological examination (general) (routine) without abnormal findings: Secondary | ICD-10-CM | POA: Diagnosis not present

## 2018-09-18 DIAGNOSIS — N841 Polyp of cervix uteri: Secondary | ICD-10-CM | POA: Insufficient documentation

## 2018-09-18 HISTORY — DX: Polyp of cervix uteri: N84.1

## 2018-10-16 DIAGNOSIS — N841 Polyp of cervix uteri: Secondary | ICD-10-CM | POA: Diagnosis not present

## 2018-10-29 DIAGNOSIS — Z01812 Encounter for preprocedural laboratory examination: Secondary | ICD-10-CM | POA: Diagnosis not present

## 2018-10-29 DIAGNOSIS — Z20828 Contact with and (suspected) exposure to other viral communicable diseases: Secondary | ICD-10-CM | POA: Diagnosis not present

## 2018-10-29 DIAGNOSIS — N841 Polyp of cervix uteri: Secondary | ICD-10-CM | POA: Diagnosis not present

## 2018-11-01 DIAGNOSIS — Z862 Personal history of diseases of the blood and blood-forming organs and certain disorders involving the immune mechanism: Secondary | ICD-10-CM | POA: Diagnosis not present

## 2018-11-01 DIAGNOSIS — N858 Other specified noninflammatory disorders of uterus: Secondary | ICD-10-CM | POA: Diagnosis not present

## 2018-11-01 DIAGNOSIS — I10 Essential (primary) hypertension: Secondary | ICD-10-CM | POA: Diagnosis not present

## 2018-11-01 DIAGNOSIS — R69 Illness, unspecified: Secondary | ICD-10-CM | POA: Diagnosis not present

## 2018-11-01 DIAGNOSIS — E785 Hyperlipidemia, unspecified: Secondary | ICD-10-CM | POA: Diagnosis not present

## 2018-11-01 DIAGNOSIS — N840 Polyp of corpus uteri: Secondary | ICD-10-CM | POA: Diagnosis not present

## 2018-11-01 DIAGNOSIS — N841 Polyp of cervix uteri: Secondary | ICD-10-CM | POA: Diagnosis not present

## 2018-11-01 DIAGNOSIS — N854 Malposition of uterus: Secondary | ICD-10-CM | POA: Diagnosis not present

## 2018-11-15 DIAGNOSIS — Z9889 Other specified postprocedural states: Secondary | ICD-10-CM | POA: Diagnosis not present

## 2019-05-06 DIAGNOSIS — Z6821 Body mass index (BMI) 21.0-21.9, adult: Secondary | ICD-10-CM | POA: Diagnosis not present

## 2019-05-06 DIAGNOSIS — R109 Unspecified abdominal pain: Secondary | ICD-10-CM | POA: Diagnosis not present

## 2019-05-06 DIAGNOSIS — S39012A Strain of muscle, fascia and tendon of lower back, initial encounter: Secondary | ICD-10-CM | POA: Diagnosis not present

## 2019-05-27 DIAGNOSIS — Z1231 Encounter for screening mammogram for malignant neoplasm of breast: Secondary | ICD-10-CM | POA: Diagnosis not present

## 2019-06-10 DIAGNOSIS — R928 Other abnormal and inconclusive findings on diagnostic imaging of breast: Secondary | ICD-10-CM | POA: Diagnosis not present

## 2019-06-10 DIAGNOSIS — R922 Inconclusive mammogram: Secondary | ICD-10-CM | POA: Diagnosis not present

## 2019-06-19 DIAGNOSIS — Z1231 Encounter for screening mammogram for malignant neoplasm of breast: Secondary | ICD-10-CM | POA: Diagnosis not present

## 2019-07-31 DIAGNOSIS — Z6820 Body mass index (BMI) 20.0-20.9, adult: Secondary | ICD-10-CM | POA: Diagnosis not present

## 2019-07-31 DIAGNOSIS — M79671 Pain in right foot: Secondary | ICD-10-CM | POA: Diagnosis not present

## 2019-07-31 DIAGNOSIS — R202 Paresthesia of skin: Secondary | ICD-10-CM | POA: Diagnosis not present

## 2019-08-05 DIAGNOSIS — Z5321 Procedure and treatment not carried out due to patient leaving prior to being seen by health care provider: Secondary | ICD-10-CM | POA: Diagnosis not present

## 2019-08-05 DIAGNOSIS — M7989 Other specified soft tissue disorders: Secondary | ICD-10-CM | POA: Diagnosis not present

## 2019-08-12 DIAGNOSIS — Z139 Encounter for screening, unspecified: Secondary | ICD-10-CM | POA: Diagnosis not present

## 2019-08-12 DIAGNOSIS — Z0000 Encounter for general adult medical examination without abnormal findings: Secondary | ICD-10-CM | POA: Diagnosis not present

## 2019-08-12 DIAGNOSIS — I739 Peripheral vascular disease, unspecified: Secondary | ICD-10-CM | POA: Diagnosis not present

## 2019-08-12 DIAGNOSIS — R0989 Other specified symptoms and signs involving the circulatory and respiratory systems: Secondary | ICD-10-CM | POA: Diagnosis not present

## 2019-08-12 DIAGNOSIS — I10 Essential (primary) hypertension: Secondary | ICD-10-CM | POA: Diagnosis not present

## 2019-08-12 DIAGNOSIS — Z720 Tobacco use: Secondary | ICD-10-CM | POA: Diagnosis not present

## 2019-08-12 DIAGNOSIS — R600 Localized edema: Secondary | ICD-10-CM | POA: Diagnosis not present

## 2019-08-12 DIAGNOSIS — Z1331 Encounter for screening for depression: Secondary | ICD-10-CM | POA: Diagnosis not present

## 2019-08-12 DIAGNOSIS — M79604 Pain in right leg: Secondary | ICD-10-CM | POA: Diagnosis not present

## 2019-08-12 DIAGNOSIS — R69 Illness, unspecified: Secondary | ICD-10-CM | POA: Diagnosis not present

## 2019-08-12 DIAGNOSIS — I700 Atherosclerosis of aorta: Secondary | ICD-10-CM | POA: Diagnosis not present

## 2019-08-12 DIAGNOSIS — Z1339 Encounter for screening examination for other mental health and behavioral disorders: Secondary | ICD-10-CM | POA: Diagnosis not present

## 2019-08-12 DIAGNOSIS — I774 Celiac artery compression syndrome: Secondary | ICD-10-CM | POA: Diagnosis not present

## 2019-08-12 DIAGNOSIS — I745 Embolism and thrombosis of iliac artery: Secondary | ICD-10-CM | POA: Diagnosis not present

## 2019-08-12 DIAGNOSIS — Z136 Encounter for screening for cardiovascular disorders: Secondary | ICD-10-CM | POA: Diagnosis not present

## 2019-08-14 ENCOUNTER — Other Ambulatory Visit: Payer: Self-pay

## 2019-08-14 DIAGNOSIS — M79673 Pain in unspecified foot: Secondary | ICD-10-CM

## 2019-08-15 ENCOUNTER — Ambulatory Visit (HOSPITAL_COMMUNITY)
Admission: RE | Admit: 2019-08-15 | Discharge: 2019-08-15 | Disposition: A | Discharge status: Home / Self Care | Payer: Medicare HMO | Source: Ambulatory Visit | Attending: Vascular Surgery | Admitting: Vascular Surgery

## 2019-08-15 ENCOUNTER — Ambulatory Visit (INDEPENDENT_AMBULATORY_CARE_PROVIDER_SITE_OTHER): Payer: Medicare HMO | Admitting: Vascular Surgery

## 2019-08-15 ENCOUNTER — Other Ambulatory Visit: Payer: Self-pay

## 2019-08-15 ENCOUNTER — Other Ambulatory Visit: Payer: Self-pay | Admitting: *Deleted

## 2019-08-15 ENCOUNTER — Encounter: Payer: Self-pay | Admitting: *Deleted

## 2019-08-15 ENCOUNTER — Encounter: Payer: Self-pay | Admitting: Vascular Surgery

## 2019-08-15 VITALS — BP 187/68 | HR 53 | Temp 97.8°F | Resp 20 | Ht 66.0 in | Wt 131.0 lb

## 2019-08-15 DIAGNOSIS — I739 Peripheral vascular disease, unspecified: Secondary | ICD-10-CM | POA: Diagnosis not present

## 2019-08-15 DIAGNOSIS — M79673 Pain in unspecified foot: Secondary | ICD-10-CM

## 2019-08-15 NOTE — Progress Notes (Signed)
Referring Physician: [facility]  Patient name: Katherine Archer MRN: [id number] DOB: 04/29/1955 Sex: female  REASON FOR CONSULT: Peripheral arterial disease with right foot rest pain  HPI: The patient speaks minimal English and the interview was conducted with her daughter today interpreting. Katherine Archer is a 64 y.o. female, with a 6 to 8-week history of pain in her right foot. She has had claudication symptoms in her right leg for several months. This has become slowly progressively worse. He does not currently have any open wounds. She is currently smoking about 1/2 to 1 pack of cigarettes per day. Discussion today for greater than 3 minutes regarding smoking cessation counseling. She is currently on Lipitor. She is not on aspirin. 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 She does not really complain of chest pain. She has never seen a cardiologist. Other medical problems include she does have a remote history of repair of a brain aneurysm.  Past Medical History:  Diagnosis Date  . Hyperlipidemia    Past Surgical History:  Procedure Laterality Date  . BRAIN SURGERY    . CEREBRAL ANEURYSM REPAIR      History reviewed. No pertinent family history.  SOCIAL HISTORY: Social History   Socioeconomic History  . Marital status: Divorced    Spouse name: Not on file  . Number of children: Not on file  . Years of education: Not on file  . Highest education level: Not on file  Occupational History  . Not on file  Tobacco Use  . Smoking status: Current Every Day Smoker    Packs/day: 0.25    Types: Cigarettes  . Smokeless tobacco: Never Used  Vaping Use  . Vaping Use: Never used  Substance and Sexual Activity  . Alcohol use: No  . Drug use: No  . Sexual activity: Not on file  Other Topics Concern  . Not on file  Social History Narrative  . Not on file   Social Determinants of Health   Financial  Resource Strain:   . Difficulty of Paying Living Expenses:   Food Insecurity:   . Worried About Charity fundraiser in the Last Year:   . Arboriculturist in the Last Year:   Transportation Needs:   . Film/video editor (Medical):   Marland Kitchen Lack of Transportation (Non-Medical):   Physical Activity:   . Days of Exercise per Week:   . Minutes of Exercise per Session:   Stress:   . Feeling of Stress :   Social Connections:   . Frequency of Communication with Friends and Family:   . Frequency of Social Gatherings with Friends and Family:   . Attends Religious Services:   . Active Member of Clubs or Organizations:   . Attends Archivist Meetings:   Marland Kitchen Marital Status:   Intimate Partner Violence:   . Fear of Current or Ex-Partner:   . Emotionally Abused:   Marland Kitchen Physically Abused:   . Sexually Abused:     Allergies  Allergen Reactions  . Amoxicillin Other (See Comments)    unknown  . Penicillins Other (See Comments)    unknown    Current Outpatient Medications  Medication Sig Dispense Refill  . atorvastatin (LIPITOR) 40 MG tablet Take 40 mg by mouth daily.    . hydrOXYzine (ATARAX/VISTARIL) 25 MG tablet Take 25 mg by mouth every 4 (four) hours as needed for itching.    Marland Kitchen  traMADol (ULTRAM) 50 MG tablet Take 50 mg by mouth 3 (three) times daily as needed.     No current facility-administered medications for this visit.    ROS:   General:  No weight loss, Fever, chills  HEENT: No recent headaches, no nasal bleeding, no visual changes, no sore throat  Neurologic: No dizziness, blackouts, seizures. No recent symptoms of stroke or mini- stroke. No recent episodes of slurred speech, or temporary blindness.  Cardiac: No recent episodes of chest pain/pressure, no shortness of breath at rest.  No shortness of breath with exertion.  Denies history of atrial fibrillation or irregular heartbeat  Vascular: No history of rest pain in feet.  No history of claudication.  No history  of non-healing ulcer, No history of DVT   Pulmonary: No home oxygen, no productive cough, no hemoptysis,  No asthma or wheezing  Musculoskeletal:  [ ]  Arthritis, [ ]  Low back pain,  [ ]  Joint pain  Hematologic:No history of hypercoagulable state.  No history of easy bleeding.  No history of anemia  Gastrointestinal: No hematochezia or melena,  No gastroesophageal reflux, no trouble swallowing  Urinary: [ ]  chronic Kidney disease, [ ]  on HD - [ ]  MWF or [ ]  TTHS, [ ]  Burning with urination, [ ]  Frequent urination, [ ]  Difficulty urinating;   Skin: No rashes  Psychological: No history of anxiety,  No history of depression   Physical Examination  Vitals:   08/15/19 1132  BP: (!) 187/68  Pulse: (!) 53  Resp: 20  Temp: 97.8 F (36.6 C)  SpO2: 96%  Weight: 131 lb (59.4 kg)  Height: 5\' 6"  (1.676 m)    Body mass index is 21.14 kg/m.  General:  Alert and oriented, no acute distress HEENT: Normal Neck: No JVD Cardiac: Regular Rate and Rhythm Abdomen: Soft, non-tender, non-distended, no mass Skin: No rash, no ulcer Extremity Pulses:  2+ radial, brachial, absent femoral, dorsalis pedis, posterior tibial pulses bilaterally Musculoskeletal: No deformity or edema  Neurologic: Upper and lower extremity motor 5/5 and symmetric  DATA:  I reviewed the images of the patient's CT angiogram from [facility] earlier this week. This shows occlusion of the right external and left external iliac artery. There is moderate stenosis of the bilateral common iliac arteries. There are bilateral superficial femoral artery occlusions. There is three-vessel runoff bilaterally. She has an anterior left renal vein. There is no evidence of aneurysm. I do not see any significant mesenteric or renal occlusive disease. Moderate stenosis was commented in the report of the CT scan of her mesenteric's.  She had bilateral ABIs performed in our office today. Right side was 0.2 left side 0.4    ASSESSMENT:  Severe peripheral arterial disease now with rest pain and limb threatening ischemia in the right foot.   PLAN: I had a lengthy discussion with the patient and her daughter today. Patient has a limb threatening situation with very decreased perfusion in the right foot and rest pain currently. She would need an inflow procedure at minimum to improve her rest pain symptoms. Most likely this would involve aortobifemoral bypass grafting or if found to have significant cardiac or pulmonary comorbidity potentially axillary bifemoral bypass grafting. In review of her CT scan she most likely is not going to be a candidate for percutaneous intervention. However I think she would benefit from aortogram lower extremity runoff and possible intervention to see if we can do something minimally invasive and if not have a roadmap of her  axillary arteries for axillary bifemoral or a better roadmap for aortobifemoral bypass planning.  1. Preoperative risk stratification and appointments with pulmonary and cardiology to determine whether or not she would be a candidate for aortobifemoral bypass grafting  2. Aortogram lower extremity runoff scheduled for August 23, 2019 to further define options of percutaneous versus an open revascularization.  3. Patient will start taking aspirin once daily today 81 mg  The patient will follow up with me after all of the above procedures.   Ruta Hinds, MD [facility] Office: [phone]

## 2019-08-15 NOTE — H&P (View-Only) (Signed)
Referring Physician: [facility]  Patient name: Katherine Archer MRN: [id number] DOB: 09-08-1955 Sex: female  REASON FOR CONSULT: Peripheral arterial disease with right foot rest pain  HPI: The patient speaks minimal English and the interview was conducted with her daughter today interpreting. Katherine Archer is a 64 y.o. female, with a 6 to 8-week history of pain in her right foot. She has had claudication symptoms in her right leg for several months. This has become slowly progressively worse. He does not currently have any open wounds. She is currently smoking about 1/2 to 1 pack of cigarettes per day. Discussion today for greater than 3 minutes regarding smoking cessation counseling. She is currently on Lipitor. She is not on aspirin. 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 She does not really complain of chest pain. She has never seen a cardiologist. Other medical problems include she does have a remote history of repair of a brain aneurysm.  Past Medical History:  Diagnosis Date   Hyperlipidemia    Past Surgical History:  Procedure Laterality Date   BRAIN SURGERY     CEREBRAL ANEURYSM REPAIR      History reviewed. No pertinent family history.  SOCIAL HISTORY: Social History   Socioeconomic History   Marital status: Divorced    Spouse name: Not on file   Number of children: Not on file   Years of education: Not on file   Highest education level: Not on file  Occupational History   Not on file  Tobacco Use   Smoking status: Current Every Day Smoker    Packs/day: 0.25    Types: Cigarettes   Smokeless tobacco: Never Used  Vaping Use   Vaping Use: Never used  Substance and Sexual Activity   Alcohol use: No   Drug use: No   Sexual activity: Not on file  Other Topics Concern   Not on file  Social History Narrative   Not on file   Social Determinants of Health   Financial  Resource Strain:    Difficulty of Paying Living Expenses:   Food Insecurity:    Worried About Charity fundraiser in the Last Year:    Arboriculturist in the Last Year:   Transportation Needs:    Film/video editor (Medical):    Lack of Transportation (Non-Medical):   Physical Activity:    Days of Exercise per Week:    Minutes of Exercise per Session:   Stress:    Feeling of Stress :   Social Connections:    Frequency of Communication with Friends and Family:    Frequency of Social Gatherings with Friends and Family:    Attends Religious Services:    Active Member of Clubs or Organizations:    Attends Archivist Meetings:    Marital Status:   Intimate Partner Violence:    Fear of Current or Ex-Partner:    Emotionally Abused:    Physically Abused:    Sexually Abused:     Allergies  Allergen Reactions   Amoxicillin Other (See Comments)    unknown   Penicillins Other (See Comments)    unknown    Current Outpatient Medications  Medication Sig Dispense Refill   atorvastatin (LIPITOR) 40 MG tablet Take 40 mg by mouth daily.     hydrOXYzine (ATARAX/VISTARIL) 25 MG tablet Take 25 mg by mouth every 4 (four) hours as needed for itching.  traMADol (ULTRAM) 50 MG tablet Take 50 mg by mouth 3 (three) times daily as needed.     No current facility-administered medications for this visit.    ROS:   General:  No weight loss, Fever, chills  HEENT: No recent headaches, no nasal bleeding, no visual changes, no sore throat  Neurologic: No dizziness, blackouts, seizures. No recent symptoms of stroke or mini- stroke. No recent episodes of slurred speech, or temporary blindness.  Cardiac: No recent episodes of chest pain/pressure, no shortness of breath at rest.  No shortness of breath with exertion.  Denies history of atrial fibrillation or irregular heartbeat  Vascular: No history of rest pain in feet.  No history of claudication.  No history  of non-healing ulcer, No history of DVT   Pulmonary: No home oxygen, no productive cough, no hemoptysis,  No asthma or wheezing  Musculoskeletal:  [ ]  Arthritis, [ ]  Low back pain,  [ ]  Joint pain  Hematologic:No history of hypercoagulable state.  No history of easy bleeding.  No history of anemia  Gastrointestinal: No hematochezia or melena,  No gastroesophageal reflux, no trouble swallowing  Urinary: [ ]  chronic Kidney disease, [ ]  on HD - [ ]  MWF or [ ]  TTHS, [ ]  Burning with urination, [ ]  Frequent urination, [ ]  Difficulty urinating;   Skin: No rashes  Psychological: No history of anxiety,  No history of depression   Physical Examination  Vitals:   08/15/19 1132  BP: (!) 187/68  Pulse: (!) 53  Resp: 20  Temp: 97.8 F (36.6 C)  SpO2: 96%  Weight: 131 lb (59.4 kg)  Height: 5\' 6"  (1.676 m)    Body mass index is 21.14 kg/m.  General:  Alert and oriented, no acute distress HEENT: Normal Neck: No JVD Cardiac: Regular Rate and Rhythm Abdomen: Soft, non-tender, non-distended, no mass Skin: No rash, no ulcer Extremity Pulses:  2+ radial, brachial, absent femoral, dorsalis pedis, posterior tibial pulses bilaterally Musculoskeletal: No deformity or edema  Neurologic: Upper and lower extremity motor 5/5 and symmetric  DATA:  I reviewed the images of the patient's CT angiogram from [facility] earlier this week. This shows occlusion of the right external and left external iliac artery. There is moderate stenosis of the bilateral common iliac arteries. There are bilateral superficial femoral artery occlusions. There is three-vessel runoff bilaterally. She has an anterior left renal vein. There is no evidence of aneurysm. I do not see any significant mesenteric or renal occlusive disease. Moderate stenosis was commented in the report of the CT scan of her mesenteric's.  She had bilateral ABIs performed in our office today. Right side was 0.2 left side 0.4    ASSESSMENT:  Severe peripheral arterial disease now with rest pain and limb threatening ischemia in the right foot.   PLAN: I had a lengthy discussion with the patient and her daughter today. Patient has a limb threatening situation with very decreased perfusion in the right foot and rest pain currently. She would need an inflow procedure at minimum to improve her rest pain symptoms. Most likely this would involve aortobifemoral bypass grafting or if found to have significant cardiac or pulmonary comorbidity potentially axillary bifemoral bypass grafting. In review of her CT scan she most likely is not going to be a candidate for percutaneous intervention. However I think she would benefit from aortogram lower extremity runoff and possible intervention to see if we can do something minimally invasive and if not have a roadmap of her  axillary arteries for axillary bifemoral or a better roadmap for aortobifemoral bypass planning.  1. Preoperative risk stratification and appointments with pulmonary and cardiology to determine whether or not she would be a candidate for aortobifemoral bypass grafting  2. Aortogram lower extremity runoff scheduled for August 23, 2019 to further define options of percutaneous versus an open revascularization.  3. Patient will start taking aspirin once daily today 81 mg  The patient will follow up with me after all of the above procedures.   Ruta Hinds, MD [facility] [place] Office: [phone]

## 2019-08-21 ENCOUNTER — Other Ambulatory Visit (HOSPITAL_COMMUNITY)
Admission: RE | Admit: 2019-08-21 | Discharge: 2019-08-21 | Disposition: A | Discharge status: Home / Self Care | Payer: Medicare HMO | Source: Ambulatory Visit | Attending: Vascular Surgery | Admitting: Vascular Surgery

## 2019-08-21 DIAGNOSIS — Z20822 Contact with and (suspected) exposure to covid-19: Secondary | ICD-10-CM | POA: Insufficient documentation

## 2019-08-21 DIAGNOSIS — I739 Peripheral vascular disease, unspecified: Secondary | ICD-10-CM | POA: Diagnosis not present

## 2019-08-21 DIAGNOSIS — Z01812 Encounter for preprocedural laboratory examination: Secondary | ICD-10-CM | POA: Insufficient documentation

## 2019-08-21 DIAGNOSIS — Z6820 Body mass index (BMI) 20.0-20.9, adult: Secondary | ICD-10-CM | POA: Diagnosis not present

## 2019-08-21 DIAGNOSIS — I10 Essential (primary) hypertension: Secondary | ICD-10-CM | POA: Diagnosis not present

## 2019-08-21 LAB — SARS CORONAVIRUS 2 (TAT 6-24 HRS): SARS Coronavirus 2: NEGATIVE

## 2019-08-23 ENCOUNTER — Inpatient Hospital Stay (HOSPITAL_COMMUNITY)
Admission: RE | Admit: 2019-08-23 | Discharge: 2019-08-27 | DRG: 092 | Disposition: A | Discharge status: Home / Self Care | Payer: Medicare HMO | Attending: Vascular Surgery | Admitting: Vascular Surgery

## 2019-08-23 ENCOUNTER — Telehealth: Payer: Self-pay

## 2019-08-23 ENCOUNTER — Other Ambulatory Visit: Payer: Self-pay

## 2019-08-23 ENCOUNTER — Encounter (HOSPITAL_COMMUNITY)
Admission: RE | Disposition: A | Discharge status: Home / Self Care | Payer: Self-pay | Source: Home / Self Care | Attending: Vascular Surgery

## 2019-08-23 ENCOUNTER — Ambulatory Visit (HOSPITAL_COMMUNITY): Payer: Medicare HMO

## 2019-08-23 DIAGNOSIS — Z79899 Other long term (current) drug therapy: Secondary | ICD-10-CM

## 2019-08-23 DIAGNOSIS — Z20822 Contact with and (suspected) exposure to covid-19: Secondary | ICD-10-CM | POA: Diagnosis present

## 2019-08-23 DIAGNOSIS — I745 Embolism and thrombosis of iliac artery: Secondary | ICD-10-CM | POA: Diagnosis not present

## 2019-08-23 DIAGNOSIS — F17210 Nicotine dependence, cigarettes, uncomplicated: Secondary | ICD-10-CM | POA: Diagnosis present

## 2019-08-23 DIAGNOSIS — Z880 Allergy status to penicillin: Secondary | ICD-10-CM | POA: Diagnosis not present

## 2019-08-23 DIAGNOSIS — I6523 Occlusion and stenosis of bilateral carotid arteries: Secondary | ICD-10-CM | POA: Diagnosis not present

## 2019-08-23 DIAGNOSIS — E785 Hyperlipidemia, unspecified: Secondary | ICD-10-CM | POA: Diagnosis not present

## 2019-08-23 DIAGNOSIS — I743 Embolism and thrombosis of arteries of the lower extremities: Secondary | ICD-10-CM | POA: Diagnosis not present

## 2019-08-23 DIAGNOSIS — I671 Cerebral aneurysm, nonruptured: Secondary | ICD-10-CM | POA: Diagnosis not present

## 2019-08-23 DIAGNOSIS — R252 Cramp and spasm: Secondary | ICD-10-CM | POA: Diagnosis present

## 2019-08-23 DIAGNOSIS — I739 Peripheral vascular disease, unspecified: Secondary | ICD-10-CM

## 2019-08-23 DIAGNOSIS — I70221 Atherosclerosis of native arteries of extremities with rest pain, right leg: Secondary | ICD-10-CM | POA: Diagnosis present

## 2019-08-23 DIAGNOSIS — Z596 Low income: Secondary | ICD-10-CM

## 2019-08-23 DIAGNOSIS — I998 Other disorder of circulatory system: Secondary | ICD-10-CM | POA: Diagnosis not present

## 2019-08-23 DIAGNOSIS — R29708 NIHSS score 8: Secondary | ICD-10-CM | POA: Diagnosis present

## 2019-08-23 DIAGNOSIS — Z1231 Encounter for screening mammogram for malignant neoplasm of breast: Secondary | ICD-10-CM

## 2019-08-23 DIAGNOSIS — N6011 Diffuse cystic mastopathy of right breast: Secondary | ICD-10-CM | POA: Diagnosis present

## 2019-08-23 DIAGNOSIS — I6503 Occlusion and stenosis of bilateral vertebral arteries: Secondary | ICD-10-CM | POA: Diagnosis present

## 2019-08-23 DIAGNOSIS — I639 Cerebral infarction, unspecified: Secondary | ICD-10-CM | POA: Diagnosis present

## 2019-08-23 DIAGNOSIS — I712 Thoracic aortic aneurysm, without rupture: Secondary | ICD-10-CM | POA: Diagnosis present

## 2019-08-23 DIAGNOSIS — I10 Essential (primary) hypertension: Secondary | ICD-10-CM | POA: Diagnosis present

## 2019-08-23 DIAGNOSIS — G8194 Hemiplegia, unspecified affecting left nondominant side: Secondary | ICD-10-CM | POA: Diagnosis not present

## 2019-08-23 DIAGNOSIS — I63213 Cerebral infarction due to unspecified occlusion or stenosis of bilateral vertebral arteries: Secondary | ICD-10-CM

## 2019-08-23 DIAGNOSIS — R29818 Other symptoms and signs involving the nervous system: Secondary | ICD-10-CM | POA: Diagnosis not present

## 2019-08-23 DIAGNOSIS — R69 Illness, unspecified: Secondary | ICD-10-CM | POA: Diagnosis not present

## 2019-08-23 DIAGNOSIS — N841 Polyp of cervix uteri: Secondary | ICD-10-CM | POA: Diagnosis present

## 2019-08-23 DIAGNOSIS — G9389 Other specified disorders of brain: Secondary | ICD-10-CM | POA: Diagnosis present

## 2019-08-23 HISTORY — DX: Peripheral vascular disease, unspecified: I73.9

## 2019-08-23 HISTORY — PX: ABDOMINAL AORTOGRAM W/LOWER EXTREMITY: CATH118223

## 2019-08-23 HISTORY — DX: Cerebral infarction, unspecified: I63.9

## 2019-08-23 LAB — CBC
HCT: 43.7 % (ref 36.0–46.0)
Hemoglobin: 14.4 g/dL (ref 12.0–15.0)
MCH: 31.4 pg (ref 26.0–34.0)
MCHC: 33 g/dL (ref 30.0–36.0)
MCV: 95.4 fL (ref 80.0–100.0)
Platelets: 156 10*3/uL (ref 150–400)
RBC: 4.58 MIL/uL (ref 3.87–5.11)
RDW: 12 % (ref 11.5–15.5)
WBC: 6.4 10*3/uL (ref 4.0–10.5)
nRBC: 0 % (ref 0.0–0.2)

## 2019-08-23 LAB — COMPREHENSIVE METABOLIC PANEL
ALT: 15 U/L (ref 0–44)
AST: 14 U/L — ABNORMAL LOW (ref 15–41)
Albumin: 3.7 g/dL (ref 3.5–5.0)
Alkaline Phosphatase: 76 U/L (ref 38–126)
Anion gap: 9 (ref 5–15)
BUN: 7 mg/dL — ABNORMAL LOW (ref 8–23)
CO2: 23 mmol/L (ref 22–32)
Calcium: 9.5 mg/dL (ref 8.9–10.3)
Chloride: 107 mmol/L (ref 98–111)
Creatinine, Ser: 0.76 mg/dL (ref 0.44–1.00)
GFR calc Af Amer: >60 mL/min (ref >60)
GFR calc non Af Amer: >60 mL/min (ref >60)
Glucose, Bld: 97 mg/dL (ref 70–99)
Potassium: 4.1 mmol/L (ref 3.5–5.1)
Sodium: 139 mmol/L (ref 135–145)
Total Bilirubin: 0.7 mg/dL (ref 0.3–1.2)
Total Protein: 6.7 g/dL (ref 6.5–8.1)

## 2019-08-23 LAB — GLUCOSE, CAPILLARY: Glucose-Capillary: 87 mg/dL (ref 70–99)

## 2019-08-23 LAB — POCT I-STAT, CHEM 8
BUN: 8 mg/dL (ref 8–23)
Calcium, Ion: 1.18 mmol/L (ref 1.15–1.40)
Chloride: 104 mmol/L (ref 98–111)
Creatinine, Ser: 0.7 mg/dL (ref 0.44–1.00)
Glucose, Bld: 102 mg/dL — ABNORMAL HIGH (ref 70–99)
HCT: 39 % (ref 36.0–46.0)
Hemoglobin: 13.3 g/dL (ref 12.0–15.0)
Potassium: 4.5 mmol/L (ref 3.5–5.1)
Sodium: 138 mmol/L (ref 135–145)
TCO2: 27 mmol/L (ref 22–32)

## 2019-08-23 LAB — POCT ACTIVATED CLOTTING TIME: Activated Clotting Time: 153 seconds

## 2019-08-23 LAB — PROTIME-INR
INR: 1.1 (ref 0.8–1.2)
Prothrombin Time: 13.4 seconds (ref 11.4–15.2)

## 2019-08-23 LAB — APTT: aPTT: 31 seconds (ref 24–36)

## 2019-08-23 SURGERY — ABDOMINAL AORTOGRAM W/LOWER EXTREMITY
Anesthesia: LOCAL | Laterality: Bilateral

## 2019-08-23 MED ORDER — PHENOL 1.4 % MT LIQD: 1.0000 | OROMUCOSAL | Status: DC | PRN

## 2019-08-23 MED ORDER — HEPARIN (PORCINE) IN NACL 1000-0.9 UT/500ML-% IV SOLN
INTRAVENOUS | Status: AC
  Filled 2019-08-23: qty 1000

## 2019-08-23 MED ORDER — NITROGLYCERIN 1 MG/10 ML FOR IR/CATH LAB
INTRA_ARTERIAL | Status: DC | PRN
  Administered 2019-08-23: 200 ug

## 2019-08-23 MED ORDER — ALUM & MAG HYDROXIDE-SIMETH 200-200-20 MG/5ML PO SUSP: 15.0000 mL | ORAL | Status: DC | PRN

## 2019-08-23 MED ORDER — PANTOPRAZOLE SODIUM 40 MG PO TBEC: 40.0000 mg | DELAYED_RELEASE_TABLET | Freq: Every day | ORAL | Status: DC

## 2019-08-23 MED ORDER — MORPHINE SULFATE (PF) 2 MG/ML IV SOLN
2.0000 mg | INTRAVENOUS | Status: DC | PRN
  Administered 2019-08-23 (×2): 2 mg via INTRAVENOUS

## 2019-08-23 MED ORDER — HEPARIN SODIUM (PORCINE) 5000 UNIT/ML IJ SOLN
5000.0000 [IU] | Freq: Three times a day (TID) | INTRAMUSCULAR | Status: DC
  Administered 2019-08-23 – 2019-08-27 (×10): 5000 [IU] via SUBCUTANEOUS
  Filled 2019-08-23 (×10): qty 1

## 2019-08-23 MED ORDER — ONDANSETRON HCL 4 MG/2ML IJ SOLN: 4.0000 mg | Freq: Four times a day (QID) | INTRAMUSCULAR | Status: DC | PRN

## 2019-08-23 MED ORDER — ACETAMINOPHEN 325 MG PO TABS: 650.0000 mg | ORAL_TABLET | ORAL | Status: DC | PRN

## 2019-08-23 MED ORDER — SODIUM CHLORIDE 0.9 % IV SOLN: INTRAVENOUS | Status: DC

## 2019-08-23 MED ORDER — POTASSIUM CHLORIDE CRYS ER 20 MEQ PO TBCR: 20.0000 meq | EXTENDED_RELEASE_TABLET | Freq: Once | ORAL | Status: DC

## 2019-08-23 MED ORDER — HYDRALAZINE HCL 20 MG/ML IJ SOLN: 5.0000 mg | INTRAMUSCULAR | Status: DC | PRN

## 2019-08-23 MED ORDER — CLOPIDOGREL BISULFATE 75 MG PO TABS
75.0000 mg | ORAL_TABLET | Freq: Every day | ORAL | Status: DC
  Administered 2019-08-24 – 2019-08-27 (×4): 75 mg via ORAL
  Filled 2019-08-23 (×4): qty 1

## 2019-08-23 MED ORDER — HEPARIN SODIUM (PORCINE) 1000 UNIT/ML IJ SOLN
INTRAMUSCULAR | Status: DC | PRN
  Administered 2019-08-23: 2000 [IU] via INTRAVENOUS

## 2019-08-23 MED ORDER — PANTOPRAZOLE SODIUM 40 MG PO TBEC
40.0000 mg | DELAYED_RELEASE_TABLET | Freq: Every day | ORAL | Status: DC
  Administered 2019-08-23 – 2019-08-27 (×5): 40 mg via ORAL
  Filled 2019-08-23 (×5): qty 1

## 2019-08-23 MED ORDER — LABETALOL HCL 5 MG/ML IV SOLN: 10.0000 mg | INTRAVENOUS | Status: DC | PRN

## 2019-08-23 MED ORDER — GUAIFENESIN-DM 100-10 MG/5ML PO SYRP: 15.0000 mL | ORAL_SOLUTION | ORAL | Status: DC | PRN

## 2019-08-23 MED ORDER — HEPARIN (PORCINE) IN NACL 1000-0.9 UT/500ML-% IV SOLN
INTRAVENOUS | Status: DC | PRN
  Administered 2019-08-23 (×2): 500 mL

## 2019-08-23 MED ORDER — MORPHINE SULFATE (PF) 2 MG/ML IV SOLN
INTRAVENOUS | Status: AC
  Filled 2019-08-23: qty 1

## 2019-08-23 MED ORDER — NITROGLYCERIN 1 MG/10 ML FOR IR/CATH LAB
INTRA_ARTERIAL | Status: AC
  Filled 2019-08-23: qty 10

## 2019-08-23 MED ORDER — ONDANSETRON HCL 4 MG/2ML IJ SOLN
INTRAMUSCULAR | Status: AC
  Filled 2019-08-23: qty 2

## 2019-08-23 MED ORDER — LIDOCAINE HCL (PF) 1 % IJ SOLN
INTRAMUSCULAR | Status: AC
  Filled 2019-08-23: qty 30

## 2019-08-23 MED ORDER — STROKE: EARLY STAGES OF RECOVERY BOOK
Freq: Once | Status: AC
  Filled 2019-08-23 (×2): qty 1

## 2019-08-23 MED ORDER — HYDRALAZINE HCL 20 MG/ML IJ SOLN
5.0000 mg | INTRAMUSCULAR | Status: DC | PRN
  Administered 2019-08-23: 5 mg via INTRAVENOUS

## 2019-08-23 MED ORDER — SODIUM CHLORIDE 0.9 % IV SOLN: 250.0000 mL | INTRAVENOUS | Status: DC | PRN

## 2019-08-23 MED ORDER — SODIUM CHLORIDE 0.9% FLUSH
3.0000 mL | INTRAVENOUS | Status: DC | PRN
  Administered 2019-08-25: 3 mL via INTRAVENOUS

## 2019-08-23 MED ORDER — SODIUM CHLORIDE 0.9% FLUSH
3.0000 mL | Freq: Two times a day (BID) | INTRAVENOUS | Status: DC
  Administered 2019-08-23 – 2019-08-26 (×6): 3 mL via INTRAVENOUS

## 2019-08-23 MED ORDER — HYDRALAZINE HCL 20 MG/ML IJ SOLN
INTRAMUSCULAR | Status: AC
  Filled 2019-08-23: qty 1

## 2019-08-23 MED ORDER — IODIXANOL 320 MG/ML IV SOLN
INTRAVENOUS | Status: DC | PRN
  Administered 2019-08-23: 167 mL via INTRA_ARTERIAL

## 2019-08-23 MED ORDER — HEPARIN SODIUM (PORCINE) 1000 UNIT/ML IJ SOLN
INTRAMUSCULAR | Status: AC
  Filled 2019-08-23: qty 1

## 2019-08-23 MED ORDER — IOHEXOL 350 MG/ML SOLN
50.0000 mL | Freq: Once | INTRAVENOUS | Status: AC | PRN
  Administered 2019-08-23: 50 mL via INTRAVENOUS

## 2019-08-23 MED ORDER — CLOPIDOGREL BISULFATE 300 MG PO TABS
300.0000 mg | ORAL_TABLET | Freq: Once | ORAL | Status: AC
  Administered 2019-08-23: 300 mg via ORAL
  Filled 2019-08-23 (×2): qty 1

## 2019-08-23 MED ORDER — ONDANSETRON HCL 4 MG/2ML IJ SOLN
4.0000 mg | Freq: Four times a day (QID) | INTRAMUSCULAR | Status: DC | PRN
  Administered 2019-08-23: 4 mg via INTRAVENOUS

## 2019-08-23 MED ORDER — SODIUM CHLORIDE 0.9 % IV SOLN: INTRAVENOUS | Status: AC

## 2019-08-23 MED ORDER — LIDOCAINE HCL (PF) 1 % IJ SOLN
INTRAMUSCULAR | Status: DC | PRN
  Administered 2019-08-23: 2 mL

## 2019-08-23 MED ORDER — METOPROLOL TARTRATE 5 MG/5ML IV SOLN: 2.0000 mg | INTRAVENOUS | Status: DC | PRN

## 2019-08-23 MED ORDER — OXYCODONE HCL 5 MG PO TABS
5.0000 mg | ORAL_TABLET | ORAL | Status: DC | PRN
  Administered 2019-08-23 – 2019-08-24 (×5): 5 mg via ORAL
  Administered 2019-08-25: 10 mg via ORAL
  Administered 2019-08-25: 5 mg via ORAL
  Administered 2019-08-26 – 2019-08-27 (×4): 10 mg via ORAL
  Filled 2019-08-23 (×2): qty 2
  Filled 2019-08-23 (×2): qty 1
  Filled 2019-08-23: qty 2
  Filled 2019-08-23 (×2): qty 1
  Filled 2019-08-23: qty 2
  Filled 2019-08-23 (×2): qty 1
  Filled 2019-08-23: qty 2

## 2019-08-23 SURGICAL SUPPLY — 11 items
CATH ANGIO 5F PIGTAIL 100CM (CATHETERS) ×2 IMPLANT
KIT MICROPUNCTURE NIT STIFF (SHEATH) ×2 IMPLANT
KIT PV (KITS) ×2 IMPLANT
SHEATH PINNACLE 5F 10CM (SHEATH) ×2 IMPLANT
SHEATH PROBE COVER 6X72 (BAG) ×2 IMPLANT
STOPCOCK MORSE 400PSI 3WAY (MISCELLANEOUS) ×2 IMPLANT
SYR MEDRAD MARK V 150ML (SYRINGE) ×2 IMPLANT
TRANSDUCER W/STOPCOCK (MISCELLANEOUS) ×2 IMPLANT
TRAY PV CATH (CUSTOM PROCEDURE TRAY) ×2 IMPLANT
TUBE CONN 8.8X1320 FR HP M-F (CONNECTOR) ×2 IMPLANT
WIRE HI TORQ VERSACORE J 260CM (WIRE) ×2 IMPLANT

## 2019-08-23 NOTE — Progress Notes (Addendum)
Site area- left  Site Prior to Removal- 0   Pressure Applied For-  30 MInutes   Bedrest Beginning at - 1210   Manual- Yes   Patient Status During Pull- Stable    Post Pull  Site- 0   Post Pull Instructions Given- Yes   Post Pull Pulses Present- Yes    Dressing Applied- Tegaderm and Gauze Dressing    Comments:

## 2019-08-23 NOTE — Progress Notes (Signed)
Pt arrived to short stay with Dr Oneida Alar at bedside. Unable to move L arm and stated L leg felt heavy. Code stroke called and rapid response to bedside. Leonel Ramsay, MD to bedside. Pt to CT

## 2019-08-23 NOTE — Progress Notes (Signed)
34fr sheath aspirated and removed from left brachial artery. Manual pressure applied for 30 minutes. Site level 0 no s+s of hematoma. Tegaderm dressing applied. Bedrest instructions given.   Spo2 96% as measured in left index finger. Bounding left radial pulse present post sheath removal.  Bedrest begins at 12:10:00

## 2019-08-23 NOTE — Progress Notes (Signed)
Pt noted to be having some spasms in her left hand after left brachial artery catheterization today for aortogram.  This was thought originally to maybe be pain after her sheath pull and has been difficult to discern due to language barrier.  He sheath was pulled about 2 hours ago.  She has flaccid paralysis of her left arm and has some heaviness of her left leg.  She states she is able to feel some in the left arm around the shoulder level.    Will call code stroke as she may have had embolic event from her arch aortogram.  Will call daughter to update  Ruta Hinds, MD [facility] [place] Office: [phone]

## 2019-08-23 NOTE — Interval H&P Note (Signed)
History and Physical Interval Note:  08/23/2019 7:54 AM  Katherine Archer  has presented today for surgery, with the diagnosis of Peripheral Artery Disease.  The various methods of treatment have been discussed with the patient and family. After consideration of risks, benefits and other options for treatment, the patient has consented to  Procedure(s): ABDOMINAL AORTOGRAM W/ Bilateral LOWER EXTREMITY Runoff (N/A) as a surgical intervention.  The patient's history has been reviewed, patient examined, no change in status, stable for surgery.  I have reviewed the patient's chart and labs.  Questions were answered to the patient's satisfaction.     Ruta Hinds

## 2019-08-23 NOTE — Telephone Encounter (Signed)
Per Dr. Oneida Alar, we need to reschedule this patient to 09/05/19 instead of 09/12/19.  I spoke to patient's daughter [name].  She said any time right after lunch is ok. Dr. Oneida Alar said it is ok to overbook him that day.  Izzac Rockett E., LPN.

## 2019-08-23 NOTE — Consult Note (Addendum)
Referring Physician: Dr Oneida Alar    Reason for Consult: code stroke  HPI: Katherine Archer is an 64 y.o. female with h/o brain aneurysm repair and HLD, HTN and PAD. She was in the out pt center for abdominal aortogram procedure today with Dr Oneida Alar. Access for angiogram was done via left brachial artery.  Procedure went well and w/o incident.Per RN, she was moving left side well post procedure and he actually had to ask her to stop moving left arm as she kept clinching her fists. Later upon recheck she was noted to have left side weakness and code stroke was called. Pt was met emergently in CT scanner and case d/w radiology. She has unusual exam with c/o left arm pain, right leg pain, some movements seen in left arm at times, other times she just let it fall to bed. Her affect is very flat and she is speaking in a near whisper per Spanish interpretor she is following all commands and speaking clearly and is A&O. Her exam has been fluctuating greatly just during CT.   Time last known well: ~10am during procedure per pt tPA Given: no, outside of time window and unclear dx  Past Medical History Past Medical History:  Diagnosis Date  . Hyperlipidemia     Surgical History Past Surgical History:  Procedure Laterality Date  . BRAIN SURGERY    . CEREBRAL ANEURYSM REPAIR      Family History  No family history on file.  Social History:   reports that she has been smoking cigarettes. She has been smoking about 0.25 packs per day. She has never used smokeless tobacco. She reports that she does not drink alcohol and does not use drugs.  Allergies:  Allergies  Allergen Reactions  . Amoxicillin Other (See Comments)    unknown  . Penicillins Other (See Comments)    unknown    Home Medications:  Medications Prior to Admission  Medication Sig Dispense Refill  . hydrALAZINE (APRESOLINE) 25 MG tablet Take 25 mg by mouth 3 (three) times daily.    . traMADol (ULTRAM) 50 MG tablet Take 50 mg by mouth  every 6 (six) hours as needed for moderate pain.       Hospital Medications . sodium chloride flush  3 mL Intravenous Q12H    ROS:  History obtained from pt  General ROS: negative for - chills, fatigue, fever, night sweats, weight gain or weight loss Psychological ROS: negative for - behavioral disorder, hallucinations, memory difficulties, mood swings or suicidal ideation Ophthalmic ROS: negative for - blurry vision, double vision, eye pain or loss of vision ENT ROS: negative for - epistaxis, nasal discharge, oral lesions, sore throat, tinnitus or vertigo Allergy and Immunology ROS: negative for - hives or itchy/watery eyes Hematological and Lymphatic ROS: negative for - bleeding problems, bruising or swollen lymph nodes Endocrine ROS: negative for - galactorrhea, hair pattern changes, polydipsia/polyuria or temperature intolerance Respiratory ROS: negative for - cough, hemoptysis, shortness of breath or wheezing Cardiovascular ROS: negative for - chest pain, dyspnea on exertion, edema or irregular heartbeat Gastrointestinal ROS: negative for - abdominal pain, diarrhea, hematemesis, nausea/vomiting or stool incontinence Genito-Urinary ROS: negative for - dysuria, hematuria, incontinence or urinary frequency/urgency Musculoskeletal ROS: c/o bilat and right leg pain (chronic and why she was here for angio in first place) negative for - joint swelling or muscular weakness Neurological ROS: as noted in HPI Dermatological ROS: negative for rash and skin lesion changes   Physical Examination:  Vitals:   08/23/19  1200 08/23/19 1210 08/23/19 1225 08/23/19 1400  BP: (!) 178/68 (!) 196/72 (!) 197/95 (!) 179/76  Pulse: 80 74 81 95  Resp: '18 20 20   ' Temp:      TempSrc:      SpO2: 97% 96% 96% 96%  Weight:      Height:        General: Appears well-developed, mod distress Psych: Affect appropriate to situation Eyes: No scleral injection HENT: No OP obstrucion Head: Normocephalic.   Cardiovascular: Normal rate and regular rhythm.  Respiratory: Effort normal and breath sounds normal to anterior ascultation GI: Soft.  No distension. There is no tenderness.  Skin: WDI    Neurological Examination Mental Status: Alert, oriented, thought content appropriate.  Speech is soft, but fluent without evidence of aphasia or dysarthria per interpreter. Able to follow 3 step commands without difficulty. Cranial Nerves: II: Visual fields grossly normal,  III,IV, VI: ptosis not present, extra-ocular motions intact bilaterally, pupils equal, round, reactive to light and accommodation V,VII: smile symmetric, facial light touch sensation normal bilaterally VIII: hearing normal bilaterally IX,X: uvula rises symmetrically XI: bilateral shoulder shrug XII: midline tongue extension Motor: Right : Upper extremity   5/5    Left:     Upper extremity   4/5  Lower extremity   5/5     Lower extremity   2/5 Tone and bulk:normal tone throughout; no atrophy noted Sensory: Pinprick and light touch intact throughout, bilaterally Deep Tendon Reflexes: 2+ and symmetric throughout Plantars: Right: downgoing   Left: downgoing Cerebellar: Unable to complete Gait: unable to test  NIHSS 1a Level of Conscious.:  1b LOC Questions:  1c LOC Commands:  2 Best Gaze:  3 Visual:  4 Facial Palsy:  5a Motor Arm - Left:4 5b Motor Arm - Right:  6a Motor Leg - Left: 2 6b Motor Leg - Right:  7 Limb Ataxia:  8 Sensory: 1 9 Best Language:  10 Dysarthria: 1 11 Extinct. and Inatten.:  TOTAL: 8 initially  Now 2 for left arm sensory deficit and left arm drift.     LABORATORY STUDIES:  Basic Metabolic Panel: Recent Labs  Lab 08/23/19 0753  NA 138  K 4.5  CL 104  GLUCOSE 102*  BUN 8  CREATININE 0.70    Liver Function Tests: No results for input(s): AST, ALT, ALKPHOS, BILITOT, PROT, ALBUMIN in the last 168 hours. No results for input(s): LIPASE, AMYLASE in the last 168 hours. No results for  input(s): AMMONIA in the last 168 hours.  CBC: Recent Labs  Lab 08/23/19 0753  HGB 13.3  HCT 39.0    Cardiac Enzymes: No results for input(s): CKTOTAL, CKMB, CKMBINDEX, TROPONINI in the last 168 hours.  BNP: Invalid input(s): POCBNP  CBG: Recent Labs  Lab 08/23/19 1405  GLUCAP 87    Microbiology:   Coagulation Studies: Recent Labs    08/23/19 1423  LABPROT 13.4  INR 1.1    Urinalysis: No results for input(s): COLORURINE, LABSPEC, PHURINE, GLUCOSEU, HGBUR, BILIRUBINUR, KETONESUR, PROTEINUR, UROBILINOGEN, NITRITE, LEUKOCYTESUR in the last 168 hours.  Invalid input(s): APPERANCEUR  Lipid Panel:  No results found for: CHOL, TRIG, HDL, CHOLHDL, VLDL, LDLCALC  HgbA1C:  No results found for: HGBA1C  Urine Drug Screen:  No results found for: LABOPIA, COCAINSCRNUR, LABBENZ, AMPHETMU, THCU, LABBARB   Alcohol Level:  No results for input(s): ETH in the last 168 hours.  Miscellaneous labs:  ECG - SR rate   BPM. (See cardiology reading for complete details)   IMAGING:  PERIPHERAL VASCULAR CATHETERIZATION  Result Date: 08/23/2019 Procedure: Ultrasound left arm, left brachial artery retrograde puncture, arch aortogram, abdominal aortogram with bilateral lower extremity runoff Preoperative diagnosis: Rest pain Postoperative diagnosis: Same Anesthesia: Local Operative findings: 1.  Bilateral external iliac artery occlusions 2.  Bilateral superficial femoral artery occlusions 3.  Normal aortic arch anatomy with patent subclavian and axillary arteries bilaterally 4.  Mild tibial disease but overall intact three-vessel runoff bilaterally Operative details: After obtaining form consent, the patient taken the [place] lab.  The patient was placed in supine just an angio table.  Patient's left upper extremities prepped and draped in usual sterile fashion.  Ultrasound was used to identify the patient's left brachial artery.  Local anesthesia was infiltrated over this.  A micropuncture  needle was used to cannulate left brachial artery near the antecubital area.  Micropuncture wire was advanced into the brachial artery and the micropuncture sheath placed over this.  An 035 versa core wire was then advanced into the left subclavian artery and the micropuncture sheath was swapped out for a 6 French short sheath.  This was thoroughly flushed with 2000 units of heparin and 200 mcg nitroglycerin.  Next the guidewire was directed into the descending thoracic aorta and into the abdominal aorta and a 5 French pigtail catheter advanced over this.  Abdominal aortogram was then obtained in AP projection.  Infrarenal abdominal aorta is patent.  The left and right renal arteries are patent.  The left and right common and internal iliac arteries are patent.  The external iliac arteries are occluded bilaterally.  There are large collaterals coming out of the internal iliacs bilaterally which reconstitute the common femoral artery and profunda distally.  Next the pigtail catheter was advanced deeper into the aorta and bilateral oblique views of the pelvis were performed which confirmed the above findings.  At this point we went back to AP projection and bilateral lower extremity runoff views were obtained through the pigtail catheter. In the left lower extremity, the left common femoral artery does not opacify much the profunda does opacify there is a flush occlusion of the left superficial femoral artery and the above-knee popliteal artery does reconstitute via collaterals.  There is three-vessel runoff to the right foot there is mild tibial disease. In the right lower extremity, there are similar findings. At this point the pigtail catheter was pulled back and redirected into the ascending aorta.  An arch aortogram was then obtained in a 40 degree LAO projection.  This shows normal type I arch anatomy with some tortuosity of the subclavian arteries bilaterally but overall no narrowing.  The axillary and  subclavian arteries are widely patent bilaterally. This point the pigtail catheter was removed over guidewire.  The sheath was left in place in the left brachial artery to be pulled in the holding area.  The patient tolerated procedure well and there were no complications.  Patient was taken the holding area in stable condition. Operative management: The patient will be scheduled in the near future for axillary bifemoral versus aortobifemoral bypass pending her cardiac and pulmonary evaluations. Ruta Hinds, MD [facility] Office: [phone]   CT HEAD CODE STROKE WO CONTRAST  Result Date: 08/23/2019 CLINICAL DATA:  Code stroke.  64 year old female left side weakness. EXAM: CT HEAD WITHOUT CONTRAST TECHNIQUE: Contiguous axial images were obtained from the base of the skull through the vertex without intravenous contrast. COMPARISON:  Head CT 11/05/2013. 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 Cerebral volume is not significantly changed since 2015. No midline shift, mass effect, or evidence of intracranial mass lesion. No ventriculomegaly. No acute intracranial hemorrhage identified. No cortically based acute infarct identified. Vascular: Left ICA terminus region aneurysm clip. Mild Calcified atherosclerosis at the skull base. No suspicious intracranial vascular hyperdensity. Skull: Previous left frontotemporal craniotomy. No acute osseous abnormality identified. Sinuses/Orbits: Visualized paranasal sinuses and mastoids are stable and well pneumatized. Other: No acute orbit or scalp soft tissue finding. ASPECTS [facility] Stroke Program Early CT Score) Total score (0-10 with 10 being normal): 10 IMPRESSION: 1. No acute cortically based infarct or acute intracranial hemorrhage identified. 2. 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  3. These results were communicated to Dr. Leonel Ramsay at 2:32 pm on 08/23/2019 by text page via the [facility] messaging system. Electronically Signed   By: Genevie Ann M.D.   On: 08/23/2019 14:33    Assessment:  Katherine Archer is a 64 y.o. female with history of h/o brain aneurysm repair and HLD, HTN and PAD. She was in the out pt center for abdominal aortogram procedure today with Dr Oneida Alar. Access for angiogram was done via left brachial artery. She is having some fluctuating left side symptoms post procedure.   Transient neurologic symptoms- Ddx: TIA, stroke, seizure, conversion or medication effect post procedure.  Other comorbidity: HTN, HLD, PAD, h/o brain aneurysm repair   Plan:  HgbA1c, fasting lipid panel  Unable to MRI brain d/t clips  PT consult, OT consult, Speech consult  Echocardiogram  Prophylactic therapy - Antiplatelet medication  Statin Therapy - LDL goal < 70  Risk factor modification  Telemetry monitoring  Frequent neuro checks  Fall Precautions  DVT prophelaxis   Attending Neurologist's note to follow Desiree Metzger-Cihelka, ARNP-C, ANVP-BC Pager: [phone]  I have seen the patient and reviewed the above note.  She developed left-sided weakness following aortic angiogram.  She states that she first noticed the symptoms sometime actually during the procedure, meaning her last known well is really before the procedure.  The time of my evaluation, we were essentially at the threshold for 4-1/2 hours, she also has a history of intracranial hemorrhage with recurrent aneurysm and her symptoms were improving, given this constellation, the risk of TPA were felt to outweigh the likely benefit.  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  Either way, would not intervene given her current mild  symptoms.  He will need to be admitted for stroke work-up and monitoring, stroke team to follow.  Roland Rack, MD [facility] [phone]  If 7pm- 7am, please page neurology on call as listed in [place].

## 2019-08-23 NOTE — Progress Notes (Signed)
Report given to Michelle RN

## 2019-08-23 NOTE — Op Note (Signed)
Procedure: Ultrasound left arm, left brachial artery retrograde puncture, arch aortogram, abdominal aortogram with bilateral lower extremity runoff  Preoperative diagnosis: Rest pain  Postoperative diagnosis: Same  Anesthesia: Local  Operative findings: 1.  Bilateral external iliac artery occlusions  2.  Bilateral superficial femoral artery occlusions  3.  Normal aortic arch anatomy with patent subclavian and axillary arteries bilaterally  4.  Mild tibial disease but overall intact three-vessel runoff bilaterally  Operative details: After obtaining form consent, the patient taken the [place] lab.  The patient was placed in supine just an angio table.  Patient's left upper extremities prepped and draped in usual sterile fashion.  Ultrasound was used to identify the patient's left brachial artery.  Local anesthesia was infiltrated over this.  A micropuncture needle was used to cannulate left brachial artery near the antecubital area.  Micropuncture wire was advanced into the brachial artery and the micropuncture sheath placed over this.  An 035 versa core wire was then advanced into the left subclavian artery and the micropuncture sheath was swapped out for a 6 French short sheath.  This was thoroughly flushed with 2000 units of heparin and 200 mcg nitroglycerin.  Next the guidewire was directed into the descending thoracic aorta and into the abdominal aorta and a 5 French pigtail catheter advanced over this.  Abdominal aortogram was then obtained in AP projection.  Infrarenal abdominal aorta is patent.  The left and right renal arteries are patent.  The left and right common and internal iliac arteries are patent.  The external iliac arteries are occluded bilaterally.  There are large collaterals coming out of the internal iliacs bilaterally which reconstitute the common femoral artery and profunda distally.  Next the pigtail catheter was advanced deeper into the aorta and bilateral oblique views of the  pelvis were performed which confirmed the above findings.  At this point we went back to AP projection and bilateral lower extremity runoff views were obtained through the pigtail catheter.  In the left lower extremity, the left common femoral artery does not opacify much the profunda does opacify there is a flush occlusion of the left superficial femoral artery and the above-knee popliteal artery does reconstitute via collaterals.  There is three-vessel runoff to the right foot there is mild tibial disease.  In the right lower extremity, there are similar findings.  At this point the pigtail catheter was pulled back and redirected into the ascending aorta.  An arch aortogram was then obtained in a 40 degree LAO projection.  This shows normal type I arch anatomy with some tortuosity of the subclavian arteries bilaterally but overall no narrowing.  The axillary and subclavian arteries are widely patent bilaterally.  This point the pigtail catheter was removed over guidewire.  The sheath was left in place in the left brachial artery to be pulled in the holding area.  The patient tolerated procedure well and there were no complications.  Patient was taken the holding area in stable condition.  Operative management: The patient will be scheduled in the near future for axillary bifemoral versus aortobifemoral bypass pending her cardiac and pulmonary evaluations.  Ruta Hinds, MD [facility] [place] Office: [phone]

## 2019-08-23 NOTE — Progress Notes (Signed)
Patient arrived to 4 E.  CCMD, vital signs, CHG.  No known urine output.  NPO d/t code stroke.

## 2019-08-23 NOTE — Code Documentation (Addendum)
Stroke Response Nurse Documentation Code Documentation  Ahleah Crockett is a 64 y.o. female who had aleft brachial artery catheterization today for aortogram this morning that started at 1000. Pt reported during the surgery that she noted some weakness in her left arm and then her left leg started to be weak post procedure. MD Fields was called and activated a Code Stroke. LKW 1000 upon started surgery.   Stroke Team met patient in her short stay room. NIHSS 8 upon initial exam with left arm weakness, left leg weakness, and decreased sensation in the left side. Labs drawn and sent down. Patient taken to CT.   CT Head completed along with CTA. After interviewing the patient, patient was outside the window for tPA. CTA showed "Positive for Bilateral Vertebral Artery Occlusion. Suspect the Left Vertebral is dominant and there is fresh thrombus extending from the left vertebral origin into the lumen of the left subclavian artery." Pt symptoms improving. Decision not to move forward with IR at this time. PLan: Q2 mNIHSS and VS. Pt to be admitted. BP < 200 per MD Leonel Ramsay. Handoff given to [place], RN Short Stay.     Kathrin Greathouse  Stroke Response RN

## 2019-08-24 ENCOUNTER — Observation Stay (HOSPITAL_BASED_OUTPATIENT_CLINIC_OR_DEPARTMENT_OTHER): Payer: Medicare HMO

## 2019-08-24 DIAGNOSIS — I6503 Occlusion and stenosis of bilateral vertebral arteries: Secondary | ICD-10-CM | POA: Diagnosis not present

## 2019-08-24 DIAGNOSIS — I10 Essential (primary) hypertension: Secondary | ICD-10-CM | POA: Diagnosis not present

## 2019-08-24 DIAGNOSIS — I739 Peripheral vascular disease, unspecified: Secondary | ICD-10-CM | POA: Diagnosis not present

## 2019-08-24 DIAGNOSIS — I361 Nonrheumatic tricuspid (valve) insufficiency: Secondary | ICD-10-CM | POA: Diagnosis not present

## 2019-08-24 DIAGNOSIS — I745 Embolism and thrombosis of iliac artery: Secondary | ICD-10-CM | POA: Diagnosis not present

## 2019-08-24 DIAGNOSIS — R29708 NIHSS score 8: Secondary | ICD-10-CM | POA: Diagnosis present

## 2019-08-24 DIAGNOSIS — G8194 Hemiplegia, unspecified affecting left nondominant side: Secondary | ICD-10-CM | POA: Diagnosis not present

## 2019-08-24 DIAGNOSIS — F17210 Nicotine dependence, cigarettes, uncomplicated: Secondary | ICD-10-CM | POA: Diagnosis present

## 2019-08-24 DIAGNOSIS — F17200 Nicotine dependence, unspecified, uncomplicated: Secondary | ICD-10-CM | POA: Diagnosis not present

## 2019-08-24 DIAGNOSIS — I63213 Cerebral infarction due to unspecified occlusion or stenosis of bilateral vertebral arteries: Secondary | ICD-10-CM

## 2019-08-24 DIAGNOSIS — I639 Cerebral infarction, unspecified: Secondary | ICD-10-CM | POA: Diagnosis not present

## 2019-08-24 DIAGNOSIS — I160 Hypertensive urgency: Secondary | ICD-10-CM

## 2019-08-24 DIAGNOSIS — G9389 Other specified disorders of brain: Secondary | ICD-10-CM | POA: Diagnosis not present

## 2019-08-24 DIAGNOSIS — Z880 Allergy status to penicillin: Secondary | ICD-10-CM | POA: Diagnosis not present

## 2019-08-24 DIAGNOSIS — R69 Illness, unspecified: Secondary | ICD-10-CM | POA: Diagnosis not present

## 2019-08-24 DIAGNOSIS — I743 Embolism and thrombosis of arteries of the lower extremities: Secondary | ICD-10-CM | POA: Diagnosis not present

## 2019-08-24 DIAGNOSIS — I712 Thoracic aortic aneurysm, without rupture: Secondary | ICD-10-CM | POA: Diagnosis present

## 2019-08-24 DIAGNOSIS — I671 Cerebral aneurysm, nonruptured: Secondary | ICD-10-CM | POA: Diagnosis not present

## 2019-08-24 DIAGNOSIS — E7800 Pure hypercholesterolemia, unspecified: Secondary | ICD-10-CM

## 2019-08-24 DIAGNOSIS — I998 Other disorder of circulatory system: Secondary | ICD-10-CM | POA: Diagnosis not present

## 2019-08-24 DIAGNOSIS — Z79899 Other long term (current) drug therapy: Secondary | ICD-10-CM | POA: Diagnosis not present

## 2019-08-24 DIAGNOSIS — R252 Cramp and spasm: Secondary | ICD-10-CM | POA: Diagnosis present

## 2019-08-24 DIAGNOSIS — I70221 Atherosclerosis of native arteries of extremities with rest pain, right leg: Secondary | ICD-10-CM | POA: Diagnosis not present

## 2019-08-24 DIAGNOSIS — Z596 Low income: Secondary | ICD-10-CM | POA: Diagnosis not present

## 2019-08-24 DIAGNOSIS — Z20822 Contact with and (suspected) exposure to covid-19: Secondary | ICD-10-CM | POA: Diagnosis not present

## 2019-08-24 DIAGNOSIS — E785 Hyperlipidemia, unspecified: Secondary | ICD-10-CM | POA: Diagnosis not present

## 2019-08-24 LAB — ECHOCARDIOGRAM COMPLETE
AR max vel: 2.28 cm2
AV Area VTI: 2 cm2
AV Area mean vel: 1.99 cm2
AV Mean grad: 5 mmHg
AV Peak grad: 11.6 mmHg
Ao pk vel: 1.7 m/s
Area-P 1/2: 3.37 cm2
Height: 66 in
S' Lateral: 2.8 cm
Weight: 2064 oz

## 2019-08-24 LAB — URINALYSIS, ROUTINE W REFLEX MICROSCOPIC
Bilirubin Urine: NEGATIVE
Glucose, UA: NEGATIVE mg/dL
Hgb urine dipstick: NEGATIVE
Ketones, ur: 20 mg/dL — AB
Nitrite: NEGATIVE
Protein, ur: NEGATIVE mg/dL
Specific Gravity, Urine: 1.038 — ABNORMAL HIGH (ref 1.005–1.030)
pH: 5 (ref 5.0–8.0)

## 2019-08-24 LAB — HEMOGLOBIN A1C
Hgb A1c MFr Bld: 5.3 % (ref 4.8–5.6)
Mean Plasma Glucose: 105.41 mg/dL

## 2019-08-24 LAB — LIPID PANEL
Cholesterol: 178 mg/dL (ref 0–200)
HDL: 38 mg/dL — ABNORMAL LOW (ref >40)
LDL Cholesterol: 127 mg/dL — ABNORMAL HIGH (ref 0–99)
Total CHOL/HDL Ratio: 4.7 RATIO
Triglycerides: 65 mg/dL (ref <150)
VLDL: 13 mg/dL (ref 0–40)

## 2019-08-24 MED ORDER — ASPIRIN EC 325 MG PO TBEC
325.0000 mg | DELAYED_RELEASE_TABLET | Freq: Every day | ORAL | Status: DC
  Administered 2019-08-24 – 2019-08-27 (×4): 325 mg via ORAL
  Filled 2019-08-24 (×4): qty 1

## 2019-08-24 MED ORDER — LABETALOL HCL 5 MG/ML IV SOLN: 10.0000 mg | INTRAVENOUS | Status: DC | PRN

## 2019-08-24 MED ORDER — ATORVASTATIN CALCIUM 80 MG PO TABS
80.0000 mg | ORAL_TABLET | Freq: Every day | ORAL | Status: DC
  Administered 2019-08-24 – 2019-08-26 (×3): 80 mg via ORAL
  Filled 2019-08-24 (×3): qty 1

## 2019-08-24 MED ORDER — HYDRALAZINE HCL 20 MG/ML IJ SOLN
5.0000 mg | INTRAMUSCULAR | Status: DC | PRN
  Administered 2019-08-25: 10 mg via INTRAVENOUS
  Filled 2019-08-24: qty 1

## 2019-08-24 NOTE — Progress Notes (Signed)
   VASCULAR SURGERY ASSESSMENT & PLAN:   POD 1 ARTERIOGRAM VIA LEFT BRACHIAL APPROACH: Patient underwent an arteriogram yesterday.  Patient had strokelike symptoms after the procedure and the code stroke was called.  Appreciate Dr. Cecil Cobbs help.  She is being admitted for stroke work-up and monitoring.  The stroke team is following.   SUBJECTIVE:   No specific complaints.  PHYSICAL EXAM:   Vitals:   08/24/19 0000 08/24/19 0400 08/24/19 0403 08/24/19 0801  BP: (!) 159/72 (!) 176/57  (!) 187/76  Pulse: 70 (!) 57 64 64  Resp: 20 (!) 21 16 (!) 22  Temp: 98.4 F (36.9 C) 98.4 F (36.9 C)  97.9 F (36.6 C)  TempSrc: Oral Oral  Oral  SpO2: 97% 98% 100% 97%  Weight:      Height:       She has persistent left upper extremity weakness. She has a palpable left radial pulse Her brachial artery cannulation site looks good without hematoma.  LABS:   Lab Results  Component Value Date   WBC 6.4 08/23/2019   HGB 14.4 08/23/2019   HCT 43.7 08/23/2019   MCV 95.4 08/23/2019   PLT 156 08/23/2019   Lab Results  Component Value Date   CREATININE 0.76 08/23/2019   Lab Results  Component Value Date   INR 1.1 08/23/2019   CBG (last 3)  Recent Labs    08/23/19 1405  GLUCAP 87    PROBLEM LIST:    Active Problems:   Cervical polyp   Encounter for screening mammogram for breast cancer   Fibrocystic breast changes of both breasts   Stroke [facility])   PAD (peripheral artery disease) (HCC)   CURRENT MEDS:   . clopidogrel  75 mg Oral Daily  . heparin  5,000 Units Subcutaneous Q8H  . pantoprazole  40 mg Oral Daily  . potassium chloride  20-40 mEq Oral Once  . sodium chloride flush  3 mL Intravenous Q12H    Deitra Mayo Office: [phone] 08/24/2019

## 2019-08-24 NOTE — Plan of Care (Signed)
Continue to monitor

## 2019-08-24 NOTE — Evaluation (Signed)
Speech Language Pathology Evaluation Patient Details Name: Katherine Archer MRN: [id number] DOB: 1956/01/02 Today's Date: 08/24/2019 Time: 1771-1657 SLP Time Calculation (min) (ACUTE ONLY): 14 min  Problem List:  Patient Active Problem List   Diagnosis Date Noted  . Stroke ([place]) 08/23/2019  . PAD (peripheral artery disease) ([place]) 08/23/2019  . Cervical polyp 09/18/2018  . Encounter for screening mammogram for breast cancer 05/05/2016  . Fibrocystic breast changes of both breasts 04/29/2015   Past Medical History:  Past Medical History:  Diagnosis Date  . Hyperlipidemia    Past Surgical History:  Past Surgical History:  Procedure Laterality Date  . BRAIN SURGERY    . CEREBRAL ANEURYSM REPAIR     HPI:  Katherine Archer is a 64 y.o. female s/p a left brachial artery catheterization complaining of left arm and leg weakness. CT negative for acute CVA.    Assessment / Plan / Recommendation Clinical Impression  Patient presents with normal cognitive-linguistic skills. Speech, language, and cognition evaluated with daughters assistance to interpret. Daughter confirms that patient appears to be baseline in these areas. No further SLP needs indicated.     SLP Assessment  SLP Recommendation/Assessment: Patient does not need any further Speech Lanaguage Pathology Services SLP Visit Diagnosis: Cognitive communication deficit (R41.841)    Follow Up Recommendations  None          SLP Evaluation Cognition  Overall Cognitive Status: Within Functional Limits for tasks assessed       Comprehension  Auditory Comprehension Overall Auditory Comprehension: Appears within functional limits for tasks assessed Visual Recognition/Discrimination Discrimination: Within Function Limits Reading Comprehension Reading Status: Not tested    Expression Expression Primary Mode of Expression: Verbal Verbal Expression Overall Verbal Expression: Appears within functional limits for tasks assessed Written  Expression Dominant Hand: Right   Oral / Motor  Oral Motor/Sensory Function Overall Oral Motor/Sensory Function: Within functional limits Motor Speech Overall Motor Speech: Appears within functional limits for tasks assessed   GO            Gabriel Rainwater MA, CCC-SLP           Mailin Coglianese Meryl 08/24/2019, 12:13 PM

## 2019-08-24 NOTE — Progress Notes (Signed)
STROKE TEAM PROGRESS NOTE   INTERVAL HISTORY Her daughter is at the bedside. The daughter served as Veterinary surgeon also today. Pt AAO x3 and clear speaking without aphasia. Still has left sided hemiparesis, but continue to complaining right leg pain due to ischemia. As per daughter, pt had left ICA aneurysm clipper in 1998 at [place]. Not compatible with MRI. Passed swallow and will put on diet. Left arm pulse strong.   OBJECTIVE Vitals:   08/24/19 0000 08/24/19 0400 08/24/19 0403 08/24/19 0801  BP: (!) 159/72 (!) 176/57  (!) 187/76  Pulse: 70 (!) 57 64 64  Resp: 20 (!) 21 16 (!) 22  Temp: 98.4 F (36.9 C) 98.4 F (36.9 C)  97.9 F (36.6 C)  TempSrc: Oral Oral  Oral  SpO2: 97% 98% 100% 97%  Weight:      Height:        CBC:  Recent Labs  Lab 08/23/19 0753 08/23/19 1816  WBC  --  6.4  HGB 13.3 14.4  HCT 39.0 43.7  MCV  --  95.4  PLT  --  858    Basic Metabolic Panel:  Recent Labs  Lab 08/23/19 0753 08/23/19 1816  NA 138 139  K 4.5 4.1  CL 104 107  CO2  --  23  GLUCOSE 102* 97  BUN 8 7*  CREATININE 0.70 0.76  CALCIUM  --  9.5    Lipid Panel:     Component Value Date/Time   CHOL 178 08/24/2019 0142   TRIG 65 08/24/2019 0142   HDL 38 (L) 08/24/2019 0142   CHOLHDL 4.7 08/24/2019 0142   VLDL 13 08/24/2019 0142   LDLCALC 127 (H) 08/24/2019 0142   HgbA1c:  Lab Results  Component Value Date   HGBA1C 5.3 08/24/2019   Urine Drug Screen: No results found for: LABOPIA, COCAINSCRNUR, LABBENZ, AMPHETMU, THCU, LABBARB  Alcohol Level No results found for: [place] Code Stroke CTA Head W/WO contrast CT Code Stroke CTA Neck W/WO contrast 08/23/2019 IMPRESSION:  1. Positive for Bilateral Vertebral Artery Occlusion. Suspect the Left Vertebral is dominant and there is fresh thrombus extending from the left vertebral origin into the lumen of the left subclavian artery.  2. Both vertebral arteries are suboptimally reconstituted distally, especially on the right.  Furthermore, a highly diminutive appearance of the distal right vertebral artery and the basilar artery raising the possibility that the right vertebral occlusion might be chronic.  3. No large vessel occlusion identified in the anterior circulation. 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  4. Extensive atherosclerosis, including the aortic arch and great vessels. 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  5. Moderate stenoses of the Left PCA P2, Right ACA A2, supraclinoid Right ICA.  6. Aortic Atherosclerosis (ICD10-I70.0)  CT HEAD CODE STROKE WO CONTRAST 08/23/2019 IMPRESSION:  1. No acute cortically based infarct or acute intracranial hemorrhage identified.  2. Previous left ICA terminus region aneurysm clipping with chronic left inferior frontal gyrus encephalomalacia  PERIPHERAL VASCULAR CATHETERIZATION 08/23/2019 Procedure: Ultrasound left arm, left brachial artery retrograde puncture, arch aortogram, abdominal aortogram with bilateral lower extremity runoff Preoperative diagnosis: Rest pain Postoperative diagnosis: Same Anesthesia: Local Operative findings: 1.  Bilateral external iliac artery occlusions 2.  Bilateral superficial femoral artery occlusions 3.  Normal aortic arch anatomy with patent subclavian and axillary arteries bilaterally 4.  Mild  tibial disease but overall intact three-vessel runoff bilaterally Operative details: After obtaining form consent, the patient taken the [place] lab.  The patient was placed in supine just an angio table.  Patient's left upper extremities prepped and draped in usual sterile fashion.  Ultrasound was used to identify the patient's left brachial artery.  Local anesthesia was infiltrated over this.  A micropuncture needle was used to cannulate left brachial artery near the antecubital area.  Micropuncture wire was advanced into the  brachial artery and the micropuncture sheath placed over this.  An 035 versa core wire was then advanced into the left subclavian artery and the micropuncture sheath was swapped out for a 6 French short sheath.  This was thoroughly flushed with 2000 units of heparin and 200 mcg nitroglycerin.  Next the guidewire was directed into the descending thoracic aorta and into the abdominal aorta and a 5 French pigtail catheter advanced over this.  Abdominal aortogram was then obtained in AP projection.  Infrarenal abdominal aorta is patent.  The left and right renal arteries are patent.  The left and right common and internal iliac arteries are patent.  The external iliac arteries are occluded bilaterally.  There are large collaterals coming out of the internal iliacs bilaterally which reconstitute the common femoral artery and profunda distally.  Next the pigtail catheter was advanced deeper into the aorta and bilateral oblique views of the pelvis were performed which confirmed the above findings.  At this point we went back to AP projection and bilateral lower extremity runoff views were obtained through the pigtail catheter. In the left lower extremity, the left common femoral artery does not opacify much the profunda does opacify there is a flush occlusion of the left superficial femoral artery and the above-knee popliteal artery does reconstitute via collaterals.  There is three-vessel runoff to the right foot there is mild tibial disease. In the right lower extremity, there are similar findings. At this point the pigtail catheter was pulled back and redirected into the ascending aorta.  An arch aortogram was then obtained in a 40 degree LAO projection.  This shows normal type I arch anatomy with some tortuosity of the subclavian arteries bilaterally but overall no narrowing.  The axillary and subclavian arteries are widely patent bilaterally. This point the pigtail catheter was removed over guidewire.  The sheath was  left in place in the left brachial artery to be pulled in the holding area.  The patient tolerated procedure well and there were no complications.  Patient was taken the holding area in stable condition. Operative management: The patient will be scheduled in the near future for axillary bifemoral versus aortobifemoral bypass pending her cardiac and pulmonary evaluations. Ruta Hinds, MD [facility] Office: [phone]   Transthoracic Echocardiogram  1. Left ventricular ejection fraction, by estimation, is 70 to 75%. The  left ventricle has hyperdynamic function. The left ventricle has no  regional wall motion abnormalities. There is moderate concentric left  ventricular hypertrophy. Left ventricular  diastolic parameters are consistent with Grade I diastolic dysfunction  (impaired relaxation). Elevated left atrial pressure.  2. Right ventricular systolic function is normal. The right ventricular  size is normal. There is moderately elevated pulmonary artery systolic  pressure.  3. The mitral valve is normal in structure. Mild mitral valve  regurgitation. No evidence of mitral stenosis.  4. The aortic valve is normal in structure. Aortic valve regurgitation is  not visualized. No aortic stenosis is present.  5.  The inferior vena cava is dilated in size with <50% respiratory  variability, suggesting right atrial pressure of 15 mmHg.   ECG - SR rate 65 BPM. Prolonged QT interval (See cardiology reading for complete details)  PHYSICAL EXAM  Temp:  [97.9 F (36.6 C)-98.7 F (37.1 C)] 97.9 F (36.6 C) (08/21 0801) Pulse Rate:  [57-104] 64 (08/21 0801) Resp:  [13-27] 22 (08/21 0801) BP: (153-211)/(52-146) 187/76 (08/21 0801) SpO2:  [94 %-100 %] 97 % (08/21 0801)  General - Well nourished, well developed, in no apparent distress.  Ophthalmologic - fundi not visualized due to noncooperation.  Cardiovascular - Regular rhythm and rate.  Mental Status  -  Level of arousal and orientation to time, place, and person were intact. Language including expression, naming, repetition, comprehension was assessed and found intact.  Cranial Nerves II - XII - II - Visual field intact OU. III, IV, VI - Extraocular movements intact. V - Facial sensation intact bilaterally. VII - Facial movement intact bilaterally. VIII - Hearing & vestibular intact bilaterally. X - Palate elevates symmetrically. XI - Chin turning & shoulder shrug intact bilaterally. XII - Tongue protrusion intact.  Motor Strength - The patient's strength was normal in right UE. However, LUE 3/5 deltoid, bicep, tricep, 0/5 wrist extension or finger movement. LLE 3/5 proximal but 5/5 distal. RLE proximal 4/5 but distal 3/5 due to pain.  Bulk was normal and fasciculations were absent.   Motor Tone - Muscle tone was assessed at the neck and appendages and was normal.  Reflexes - The patient's reflexes were symmetrical in all extremities and she had no pathological reflexes.  Sensory - Light touch, temperature/pinprick were assessed and were decreased on the LUE and LLE. In addition, painful skin touch on the right LE below the knee.    Coordination - The patient had normal movements in the right hand with no ataxia or dysmetria.  Tremor was absent.  Gait and Station - deferred.   ASSESSMENT/PLAN [name] is a 64 y.o. female with history of brain aneurysm repair and HLD, HTN and PAD  presenting with left arm pain, right leg pain, left arm weakness (some movements seen in left arm at times, other times she just let it fall to bed) following an abdominal arteriogram.  She did not receive IV t-PA due to outside of time window and unclear dx.  Stroke: possible posterior circulation infarct from left VA and subclavian thrombus s/p angiogram with left brachial access  CT Head - No acute cortically based infarct or acute intracranial hemorrhage identified. Previous left ICA terminus  region aneurysm clipping with chronic left inferior frontal gyrus encephalomalacia  MRI not able to perform due to aneurysm clip  CTA H&N - Fresh thrombus extending from the left vertebral origin into the lumen of the left subclavian artery. 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 BA diminutive.   2D Echo -EF 70 to 75%  [employer] Virus 2 - negative  LDL - 127  HgbA1c - 5.3  VTE prophylaxis - [place] heparin  No antithrombotic prior to admission, now on clopidogrel 75 mg daily and aspirin 325 DAPT for 3 months and then aspirin alone given large vessel occlusion.  Patient will be counseled to be compliant with her antithrombotic medications  Ongoing aggressive stroke risk factor management  Therapy recommendations:  pending  Disposition:  Pending  Ischemic right lower extremity  Painful right lower extremity  Sensitive to touch on exam  Bilateral lower extremity  severe PAD, planning for axillary bifemoral versus aortobifemoral bypass in the near future by [facility]  We will discuss with previous Dr. Oneida Alar about exactly timing for procedure  Multifocal vasculopathy  CT head and neck extensive atherosclerosis including aortic arch, and great vessels.  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  Moderate stenosis left P2, right A2 supraclinoid right ICA.  CTA head and neck also showed bilateral VA occlusion with distal recon, more at the right.  Right VA occlusion likely chronic.  BA diminutive.  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 Estanislado Pandy on Monday.  Hypertension  Home BP meds: hydralazine  Permissive hypertension (OK if <220/120) for 24-48 hours post stroke and then gradually  normalized within 3-5 days. . Avoid extreme BP elevation due to saccular aneurysm at the Left ICA terminus. . Avoid low BP due to bilateral VA occlusion and diminutive basilar artery. . 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 I spent  35 minutes in total face-to-face time with the patient, more than 50% of which was spent in counseling and coordination of care, reviewing test results, images and medication, and discussing the diagnosis, treatment plan and potential prognosis. This patient's care requiresreview of multiple databases, neurological assessment, discussion with family, other specialists and medical decision making of high complexity. I had long discussion with patient and daughter at bedside, updated pt current condition, treatment plan and potential prognosis, and answered all the questions.  They expressed understanding and appreciation.   Rosalin Hawking, MD PhD Stroke Neurology 08/24/2019 6:00 PM  To contact Stroke Continuity provider, please refer to [email or web address] After hours, contact General Neurology

## 2019-08-24 NOTE — Progress Notes (Signed)
  Echocardiogram 2D Echocardiogram has been performed.  Katherine Archer 08/24/2019, 8:52 AM

## 2019-08-24 NOTE — Evaluation (Addendum)
Occupational Therapy Evaluation Patient Details Name: Katherine Archer MRN: [id number] DOB: 1955/07/14 Today's Date: 08/24/2019    History of Present Illness Pt is a 64 y/o female with history of brain aneurysm repair, HTN, and PAD; patient at outpatient center for abdominal aortogram procedure 8/20, after procedure noted L sided weakness and code stroke was called.  CT negative for acute abnormality. Workup pending.    Clinical Impression   PTA patient independent with limited household mobility, ADLs and limited IADLs. Admitted for above and limited by problem list below, including L UE weakness and decreased functional use, R LE pain, impaired balance, impaired vision (reports diplopia intermittently), and decreased activity tolerance.  She presents with distal hemiparesis of L hand, flexion synergy patterns with increased tone in elbow, forearm, wrist and hand flexors; educated pt and daughter on keeping UE elevated and in extension--have reaching out to attending for resting hand splint order. Session limited today by elevated BP with minimal activity-- see below for details.  Patient currently requires min assist-total assist for ADLs, min assist +2 for lateral scoots along EOB, and min guard-min assist for bed mobility. Patients daughter interpreting during session, per preference, and cognition appears [facility] with slightly increased time for processing. Believe she will benefit from further OT services while admitted and after dc at CIR level to optimize independence and safety with ADLs and mobility prior to returning home with support.     BP seated EOB 195/74, after minimal activity at EOB 206/69, supine at completion of session 189/77--RN aware   Follow Up Recommendations  CIR;Supervision/Assistance - 24 hour    Equipment Recommendations  3 in 1 bedside commode    Recommendations for Other Services Rehab consult     Precautions / Restrictions Precautions Precautions: Fall Precaution  Comments: watch BP  Restrictions Weight Bearing Restrictions: No      Mobility Bed Mobility Overal bed mobility: Needs Assistance Bed Mobility: Supine to Sit;Sit to Supine     Supine to sit: Min guard Sit to supine: Min assist   General bed mobility comments: min guard for safety and balance to EOB, returned to supine with assist for RLE mgmt   Transfers Overall transfer level: Needs assistance   Transfers: Lateral/Scoot Transfers          Lateral/Scoot Transfers: +2 physical assistance;Min assist General transfer comment: min assist +2 to scoot towards R to [facility], unable to stand due to elevated BP     Balance Overall balance assessment: Needs assistance Sitting-balance support: No upper extremity supported;Feet supported Sitting balance-Leahy Scale: Fair                                     ADL either performed or assessed with clinical judgement   ADL Overall ADL's : Needs assistance/impaired     Grooming: Minimal assistance;Sitting   Upper Body Bathing: Minimal assistance;Sitting   Lower Body Bathing: Maximal assistance;Sitting/lateral leans   Upper Body Dressing : Sitting;Moderate assistance   Lower Body Dressing: Sitting/lateral leans;Total assistance     Toilet Transfer Details (indicate cue type and reason): lateral scooting towards HOB, but limited due to elevated BP          Functional mobility during ADLs: Moderate assistance;+2 for physical assistance General ADL Comments: pt limited by L sided UE weakness, R sided LE pain and weakness, impaired balance      Vision Baseline Vision/History: Wears glasses Wears Glasses: Reading only Patient  Visual Report: Diplopia Vision Assessment?: Yes Eye Alignment: Within Functional Limits Ocular Range of Motion: Within Functional Limits Alignment/Gaze Preference: Within Defined Limits Tracking/Visual Pursuits: Able to track stimulus in all quads without difficulty Visual Fields: No  apparent deficits (although pt requires mulitple cues to keep gaze central ) Diplopia Assessment: Other (comment) (reports in primary gaze intermittently ) Additional Comments: patient reports diplopia intermittently throughout the day, continue to assess (was not present during OT eval)      Perception     Praxis      Pertinent Vitals/Pain Pain Assessment: Faces Faces Pain Scale: Hurts little more Pain Location: R leg  Pain Descriptors / Indicators: Discomfort;Guarding Pain Intervention(s): Limited activity within patient's tolerance;Monitored during session;Repositioned     Hand Dominance Right   Extremity/Trunk Assessment Upper Extremity Assessment Upper Extremity Assessment: LUE deficits/detail LUE Deficits / Details: grossly 3-/5 proximally (shoulder and elbow), distally 0-5 wrist/forearm/hand; flexor synergy noted with increased tone in elbow flexors, forearm pronators and hand flexors  LUE Sensation: WNL LUE Coordination: decreased fine motor;decreased gross motor   Lower Extremity Assessment Lower Extremity Assessment: Defer to PT evaluation   Cervical / Trunk Assessment Cervical / Trunk Assessment: Normal   Communication Communication Communication: Prefers language other than English (daughter interpreting per pt preference)   Cognition Arousal/Alertness: Awake/alert Behavior During Therapy: WFL for tasks assessed/performed Overall Cognitive Status: Within Functional Limits for tasks assessed                                 General Comments: appears WFL, limited due to language barrier--but following interpreter commands with increased time    General Comments  daughter reports son that lived with patient recently passed in the spring; patient with supportive family; limited session due to elevated BP (EOB 195/74, after minimal activity EOB 206/69, supine 189/77)     Exercises     Shoulder Instructions      Home Living Family/patient expects to  be discharged to:: Private residence Living Arrangements: Children (daughter) Available Help at Discharge: Family;Available 24 hours/day Type of Home: Mobile home Home Access: Stairs to enter Entrance Stairs-Number of Steps: 6 Entrance Stairs-Rails: Can reach both Home Layout: One level     Bathroom Shower/Tub: Occupational psychologist: Standard     Home Equipment: Environmental consultant - 2 wheels;Cane - single point          Prior Functioning/Environment Level of Independence: Independent with assistive device(s)        Comments: independent using cane as needed, ADLs and limited IADLs         OT Problem List: Decreased strength;Decreased activity tolerance;Decreased range of motion;Impaired balance (sitting and/or standing);Decreased coordination;Decreased cognition;Decreased safety awareness;Impaired UE functional use;Decreased knowledge of precautions;Decreased knowledge of use of DME or AE;Impaired tone;Pain;Impaired vision/perception      OT Treatment/Interventions: Self-care/ADL training;Neuromuscular education;DME and/or AE instruction;Therapeutic activities;Visual/perceptual remediation/compensation;Patient/family education;Balance training    OT Goals(Current goals can be found in the care plan section) Acute Rehab OT Goals Patient Stated Goal: get stronger  OT Goal Formulation: With patient Time For Goal Achievement: 09/07/19 Potential to Achieve Goals: Good  OT Frequency: Min 3X/week   Barriers to D/C:            Co-evaluation PT/OT/SLP Co-Evaluation/Treatment: Yes Reason for Co-Treatment: For patient/therapist safety;To address functional/ADL transfers   OT goals addressed during session: ADL's and self-care      AM-PAC OT "6 Clicks" Daily Activity  Outcome Measure Help from another person eating meals?: A Little Help from another person taking care of personal grooming?: A Little Help from another person toileting, which includes using toliet,  bedpan, or urinal?: A Lot Help from another person bathing (including washing, rinsing, drying)?: A Lot Help from another person to put on and taking off regular upper body clothing?: A Lot Help from another person to put on and taking off regular lower body clothing?: A Lot 6 Click Score: 14   End of Session Nurse Communication: Mobility status  Activity Tolerance: Treatment limited secondary to medical complications (Comment) (elevated BP ) Patient left: in bed;with call bell/phone within reach;with bed alarm set;with family/visitor present;with SCD's reapplied  OT Visit Diagnosis: Other abnormalities of gait and mobility (R26.89);Muscle weakness (generalized) (M62.81);Pain;Hemiplegia and hemiparesis Hemiplegia - Right/Left: Left Hemiplegia - dominant/non-dominant: Non-Dominant Pain - Right/Left: Right Pain - part of body: Leg;Ankle and joints of foot                Time: 9562-1308 OT Time Calculation (min): 32 min Charges:  OT General Charges $OT Visit: 1 Visit OT Evaluation $OT Eval Moderate Complexity: 1 Mod  Katherine Archer, OT Acute Rehabilitation Services Pager [phone] Office [phone]   Katherine Archer 08/24/2019, 3:00 PM

## 2019-08-24 NOTE — Evaluation (Signed)
Physical Therapy Evaluation Patient Details Name: Katherine Archer MRN: [id number] DOB: 08-10-1955 Today's Date: 08/24/2019   History of Present Illness  Pt is a 64 y/o female with history of brain aneurysm repair, HTN, and PAD; patient at outpatient center for abdominal aortogram procedure 8/20, after procedure noted L sided weakness and code stroke was called.  CT negative for acute abnormality. Workup pending.     Clinical Impression  Pt in bed upon arrival of PT, agreeable to evaluation at this time. Prior to admission the pt was able to mobilize in her room with use of a cane as needed, completing ADLs independently, but with assist from her daughter for home-care. The pt now presents with limitations in functional mobility, strength, stability, and activity tolerance due to above dx and resulting pain, and will continue to benefit from skilled PT to address these deficits. Despite pt motivation to participate, today's session was limited by elevated BP when sitting EOB and unable to progress to OOB mobility. The pt was able to demo bed mobility and lateral scooting along the EOB, with minA of 2 to complete safely due to limitations in strength in LUE and RLE due to pain. The pt will continue to benefit from skilled PT to further progress functional strength, activity tolerance, and stability prior to return home with assist from her family.   BP During Session: Sitting EOB: 195/75 Sitting EOB: 206/69 Supine at end of session: 189/77     Follow Up Recommendations CIR    Equipment Recommendations   (defer to post acute)    Recommendations for Other Services Rehab consult     Precautions / Restrictions Precautions Precautions: Fall Precaution Comments: watch BP (permissive HTN systolic 408 - 144 per RN) Restrictions Weight Bearing Restrictions: No      Mobility  Bed Mobility Overal bed mobility: Needs Assistance Bed Mobility: Supine to Sit;Sit to Supine     Supine to sit: Min  guard Sit to supine: Min assist   General bed mobility comments: min guard for safety and balance to EOB, returned to supine with assist for RLE mgmt   Transfers Overall transfer level: Needs assistance Equipment used: None Transfers: Lateral/Scoot Transfers          Lateral/Scoot Transfers: +2 physical assistance;Min assist General transfer comment: min assist +2 to scoot towards R to [facility], unable to stand due to elevated BP   Ambulation/Gait             General Gait Details: deferred due to elevated BP      Balance Overall balance assessment: Needs assistance Sitting-balance support: No upper extremity supported;Feet supported Sitting balance-Leahy Scale: Fair                                       Pertinent Vitals/Pain Pain Assessment: Faces Faces Pain Scale: Hurts little more Pain Location: R leg (shin down to foot) Pain Descriptors / Indicators: Discomfort;Guarding Pain Intervention(s): Limited activity within patient's tolerance;Monitored during session;Repositioned    Home Living Family/patient expects to be discharged to:: Private residence Living Arrangements: Children (daughter) Available Help at Discharge: Family;Available 24 hours/day Type of Home: Mobile home Home Access: Stairs to enter Entrance Stairs-Rails: Can reach both Entrance Stairs-Number of Steps: 6 Home Layout: One level Home Equipment: Walker - 2 wheels;Cane - single point      Prior Function Level of Independence: Independent with assistive device(s)  Comments: independent using cane as needed, ADLs and limited IADLs      Hand Dominance   Dominant Hand: Right    Extremity/Trunk Assessment   Upper Extremity Assessment Upper Extremity Assessment: Defer to OT evaluation LUE Deficits / Details: grossly 3-/5 proximally (shoulder and elbow), distally 0-5 wrist/forearm/hand; flexor synergy noted with increased tone in elbow flexors, forearm pronators and  hand flexors  LUE Sensation: WNL LUE Coordination: decreased fine motor;decreased gross motor    Lower Extremity Assessment Lower Extremity Assessment: RLE deficits/detail (LLE grossly 4/5 at ankle, knee, and hip. denies difference in sensation between legs) RLE Deficits / Details: unable to fully differentiate pain vs weakness. partial ROM against gravity at knee and hip. PT unable to demo movement at ankle. RLE: Unable to fully assess due to pain    Cervical / Trunk Assessment Cervical / Trunk Assessment: Normal  Communication   Communication: Prefers language other than English (spanish, declined interpreter. daughter used as Astronomer)  Cognition Arousal/Alertness: Awake/alert Behavior During Therapy: WFL for tasks assessed/performed Overall Cognitive Status: Within Functional Limits for tasks assessed                                 General Comments: appears WFL, limited due to language barrier--but following interpreter commands with increased time       General Comments General comments (skin integrity, edema, etc.): Pt with supportive family, limited session today due to elevated BP (195/74 initially sitting EOB, increased to 206/69, 189/77 when returned to supine)    Exercises     Assessment/Plan    PT Assessment Patient needs continued PT services  PT Problem List Decreased strength;Decreased mobility;Decreased activity tolerance;Decreased balance;Pain       PT Treatment Interventions DME instruction;Therapeutic exercise;Gait training;Stair training;Functional mobility training;Therapeutic activities;Patient/family education;Balance training;Neuromuscular re-education    PT Goals (Current goals can be found in the Care Plan section)  Acute Rehab PT Goals Patient Stated Goal: get stronger  PT Goal Formulation: With patient Time For Goal Achievement: 09/07/19 Potential to Achieve Goals: Good    Frequency Min 4X/week   Barriers to discharge         Co-evaluation PT/OT/SLP Co-Evaluation/Treatment: Yes Reason for Co-Treatment: For patient/therapist safety;Complexity of the patient's impairments (multi-system involvement);To address functional/ADL transfers PT goals addressed during session: Mobility/safety with mobility;Balance;Strengthening/ROM OT goals addressed during session: ADL's and self-care       AM-PAC PT "6 Clicks" Mobility  Outcome Measure Help needed turning from your back to your side while in a flat bed without using bedrails?: A Little Help needed moving from lying on your back to sitting on the side of a flat bed without using bedrails?: A Little Help needed moving to and from a bed to a chair (including a wheelchair)?: A Lot Help needed standing up from a chair using your arms (e.g., wheelchair or bedside chair)?: A Lot Help needed to walk in hospital room?: Total Help needed climbing 3-5 steps with a railing? : Total 6 Click Score: 12    End of Session Equipment Utilized During Treatment: Gait belt Activity Tolerance: Patient tolerated treatment well;Patient limited by fatigue;Other (comment) (limited due to elevated BP) Patient left: in bed;with family/visitor present;with call bell/phone within reach (SLP entering room) Nurse Communication: Mobility status PT Visit Diagnosis: Muscle weakness (generalized) (M62.81);Difficulty in walking, not elsewhere classified (R26.2);Pain Pain - Right/Left: Right Pain - part of body: Leg    Time: 1119-1150 PT Time Calculation (min) (  ACUTE ONLY): 31 min   Charges:   PT Evaluation $PT Eval Moderate Complexity: 1 Mod          Karma Ganja, PT, DPT   Acute Rehabilitation Department Pager #: [phone]  Otho Bellows 08/24/2019, 5:09 PM

## 2019-08-25 ENCOUNTER — Inpatient Hospital Stay (HOSPITAL_COMMUNITY): Payer: Medicare HMO

## 2019-08-25 MED ORDER — HYDRALAZINE HCL 25 MG PO TABS
25.0000 mg | ORAL_TABLET | Freq: Three times a day (TID) | ORAL | Status: DC
  Administered 2019-08-25 – 2019-08-27 (×6): 25 mg via ORAL
  Filled 2019-08-25 (×6): qty 1

## 2019-08-25 NOTE — Plan of Care (Signed)
Continue to monitor

## 2019-08-25 NOTE — Progress Notes (Addendum)
   VASCULAR SURGERY ASSESSMENT & PLAN:   POD 2 ARTERIOGRAM VIA LEFT BRACHIAL APPROACH: Patient underwent an arteriogram yesterday.  Patient had strokelike symptoms after the procedure and the code stroke was called.  The patient will be scheduled in the near future for axillary bifemoral versus aortobifemoral bypass pending her cardiac and pulmonary evaluations. Plavix, asa, statin continue.  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  The stroke team is following. LUE weakness as displayed by wrist drop. ST has cleared for regular diet.  Disposition: Inpatient rehab coordinator following up on possible CIR placement.   SUBJECTIVE:   Awake, alert. Follows simple commands. Attendant RN at bedside.  PHYSICAL EXAM:   Vitals:   08/24/19 1946 08/24/19 2051 08/24/19 2356 08/25/19 0428  BP: (!) 163/67 (!) 151/66 (!) 166/73 (!) 187/66  Pulse: 60 (!) 58 72   Resp: 19 19 19 15   Temp: 98.1 F (36.7 C) 98.4 F (36.9 C) 97.9 F (36.6 C) 98.2 F (36.8 C)  TempSrc: Oral Oral Oral Oral  SpO2: 98% 96% 98% 99%  Weight:      Height:       General appearance: Well-developed, well-nourished in no apparent distress.   Neurologic: Alert and oriented.  Follows commands.  No facial asymmetry. Cardiac: Rate and rhythm are regular Lungs: Clear to auscultation bilaterally Upper extremities: 5 out of 5 right hand grip strength.  Able to raise both arms.  1 out of 5 left hand grip strength.  2+ radial pulse.  Left brachial artery cannulation site without hematoma. Lower extremities: Able to raise both legs from the bed.  Brisk left DP, PT and peroneal artery Doppler signals.  Brisk right peroneal Doppler signal only.  No tissue loss of either foot.  Motor and sensation intact.  LABS:   Lab Results  Component Value Date   WBC 6.4 08/23/2019   HGB 14.4 08/23/2019   HCT 43.7 08/23/2019   MCV 95.4 08/23/2019   PLT 156  08/23/2019   Lab Results  Component Value Date   CREATININE 0.76 08/23/2019   Lab Results  Component Value Date   INR 1.1 08/23/2019   CBG (last 3)  Recent Labs    08/23/19 1405  GLUCAP 87    PROBLEM LIST:    Active Problems:   Cervical polyp   Encounter for screening mammogram for breast cancer   Fibrocystic breast changes of both breasts   Stroke [facility])   PAD (peripheral artery disease) (HCC)   CURRENT MEDS:   . aspirin EC  325 mg Oral Daily  . atorvastatin  80 mg Oral q1800  . clopidogrel  75 mg Oral Daily  . heparin  5,000 Units Subcutaneous Q8H  . pantoprazole  40 mg Oral Daily  . potassium chloride  20-40 mEq Oral Once  . sodium chloride flush  3 mL Intravenous Q12H    Barbie Banner, [place] Office: [phone] 08/25/2019   I have interviewed the patient and examined the patient. I agree with the findings by the PA.  Continue aggressive therapy for her stroke.  Being considered for CIR.  Obviously any vascular intervention is on hold at this point.  Gae Gallop, MD [phone]

## 2019-08-25 NOTE — Progress Notes (Signed)
Orthopedic Tech Progress Note Patient Details:  Katherine Archer 07-Mar-1955 [id number] Called in order for left resting hand splint Patient ID: Katherine Archer, female   DOB: [birth year], 64 y.o.   MRN: [id number]   [name] 08/25/2019, 4:14 PM

## 2019-08-25 NOTE — Progress Notes (Addendum)
STROKE TEAM PROGRESS NOTE   INTERVAL HISTORY No family is at the bedside. Pt lying in bed, awake, alert, interactive. Left UE and LE weakness much improved. RLE pain still there and still need to be addressed.   OBJECTIVE Vitals:   08/24/19 1946 08/24/19 2051 08/24/19 2356 08/25/19 0428  BP: (!) 163/67 (!) 151/66 (!) 166/73 (!) 187/66  Pulse: 60 (!) 58 72   Resp: 19 19 19 15   Temp: 98.1 F (36.7 C) 98.4 F (36.9 C) 97.9 F (36.6 C) 98.2 F (36.8 C)  TempSrc: Oral Oral Oral Oral  SpO2: 98% 96% 98% 99%  Weight:      Height:        CBC:  Recent Labs  Lab 08/23/19 0753 08/23/19 1816  WBC  --  6.4  HGB 13.3 14.4  HCT 39.0 43.7  MCV  --  95.4  PLT  --  509    Basic Metabolic Panel:  Recent Labs  Lab 08/23/19 0753 08/23/19 1816  NA 138 139  K 4.5 4.1  CL 104 107  CO2  --  23  GLUCOSE 102* 97  BUN 8 7*  CREATININE 0.70 0.76  CALCIUM  --  9.5    Lipid Panel:     Component Value Date/Time   CHOL 178 08/24/2019 0142   TRIG 65 08/24/2019 0142   HDL 38 (L) 08/24/2019 0142   CHOLHDL 4.7 08/24/2019 0142   VLDL 13 08/24/2019 0142   LDLCALC 127 (H) 08/24/2019 0142   HgbA1c:  Lab Results  Component Value Date   HGBA1C 5.3 08/24/2019   Urine Drug Screen: No results found for: LABOPIA, COCAINSCRNUR, LABBENZ, AMPHETMU, THCU, LABBARB  Alcohol Level No results found for: [place] Code Stroke CTA Head W/WO contrast CT Code Stroke CTA Neck W/WO contrast 08/23/2019 IMPRESSION:  1. Positive for Bilateral Vertebral Artery Occlusion. Suspect the Left Vertebral is dominant and there is fresh thrombus extending from the left vertebral origin into the lumen of the left subclavian artery.  2. Both vertebral arteries are suboptimally reconstituted distally, especially on the right. Furthermore, a highly diminutive appearance of the distal right vertebral artery and the basilar artery raising the possibility that the right vertebral occlusion might be chronic.  3. No  large vessel occlusion identified in the anterior circulation. 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  4. Extensive atherosclerosis, including the aortic arch and great vessels. 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  5. Moderate stenoses of the Left PCA P2, Right ACA A2, supraclinoid Right ICA.  6. Aortic Atherosclerosis (ICD10-I70.0)  CT HEAD CODE STROKE WO CONTRAST 08/23/2019 IMPRESSION:  1. No acute cortically based infarct or acute intracranial hemorrhage identified.  2. Previous left ICA terminus region aneurysm clipping with chronic left inferior frontal gyrus encephalomalacia  PERIPHERAL VASCULAR CATHETERIZATION 08/23/2019 Procedure: Ultrasound left arm, left brachial artery retrograde puncture, arch aortogram, abdominal aortogram with bilateral lower extremity runoff Preoperative diagnosis: Rest pain Postoperative diagnosis: Same Anesthesia: Local Operative findings: 1.  Bilateral external iliac artery occlusions 2.  Bilateral superficial femoral artery occlusions 3.  Normal aortic arch anatomy with patent subclavian and axillary arteries bilaterally 4.  Mild tibial disease but overall intact three-vessel runoff bilaterally Operative details: After obtaining form consent, the patient taken the [place] lab.  The patient was placed in supine just an angio table.  Patient's left upper extremities prepped and draped in usual sterile fashion.  Ultrasound was used to identify the patient's left brachial artery.  Local anesthesia was infiltrated over this.  A micropuncture needle was used to cannulate left brachial artery near the antecubital area.  Micropuncture wire was advanced into the brachial artery and the micropuncture sheath placed over this.  An 035 versa core wire was then advanced into the left subclavian artery and the micropuncture sheath was swapped out for a  6 French short sheath.  This was thoroughly flushed with 2000 units of heparin and 200 mcg nitroglycerin.  Next the guidewire was directed into the descending thoracic aorta and into the abdominal aorta and a 5 French pigtail catheter advanced over this.  Abdominal aortogram was then obtained in AP projection.  Infrarenal abdominal aorta is patent.  The left and right renal arteries are patent.  The left and right common and internal iliac arteries are patent.  The external iliac arteries are occluded bilaterally.  There are large collaterals coming out of the internal iliacs bilaterally which reconstitute the common femoral artery and profunda distally.  Next the pigtail catheter was advanced deeper into the aorta and bilateral oblique views of the pelvis were performed which confirmed the above findings.  At this point we went back to AP projection and bilateral lower extremity runoff views were obtained through the pigtail catheter. In the left lower extremity, the left common femoral artery does not opacify much the profunda does opacify there is a flush occlusion of the left superficial femoral artery and the above-knee popliteal artery does reconstitute via collaterals.  There is three-vessel runoff to the right foot there is mild tibial disease. In the right lower extremity, there are similar findings. At this point the pigtail catheter was pulled back and redirected into the ascending aorta.  An arch aortogram was then obtained in a 40 degree LAO projection.  This shows normal type I arch anatomy with some tortuosity of the subclavian arteries bilaterally but overall no narrowing.  The axillary and subclavian arteries are widely patent bilaterally. This point the pigtail catheter was removed over guidewire.  The sheath was left in place in the left brachial artery to be pulled in the holding area.  The patient tolerated procedure well and there were no complications.  Patient was taken the holding area in  stable condition. Operative management: The patient will be scheduled in the near future for axillary bifemoral versus aortobifemoral bypass pending her cardiac and pulmonary evaluations. Ruta Hinds, MD [facility] Office: [phone]   Transthoracic Echocardiogram  1. Left ventricular ejection fraction, by estimation, is 70 to 75%. The  left ventricle has hyperdynamic function. The left ventricle has no  regional wall motion abnormalities. There is moderate concentric left  ventricular hypertrophy. Left ventricular  diastolic parameters are consistent with Grade I diastolic dysfunction  (impaired relaxation). Elevated left atrial pressure.  2. Right ventricular systolic function is normal. The right ventricular  size is normal. There is moderately elevated pulmonary artery systolic  pressure.  3. The mitral valve is normal in structure. Mild mitral valve  regurgitation. No evidence of mitral stenosis.  4. The aortic valve is normal in structure. Aortic valve regurgitation is  not visualized. No aortic stenosis is present.  5. The inferior vena cava is dilated in size with <50% respiratory  variability, suggesting right atrial pressure of 15 mmHg.   ECG - SR rate 65 BPM. Prolonged QT interval (See  cardiology reading for complete details)  PHYSICAL EXAM  Temp:  [97.9 F (36.6 C)-98.4 F (36.9 C)] 98.2 F (36.8 C) (08/22 0428) Pulse Rate:  [58-72] 72 (08/21 2356) Resp:  [15-22] 15 (08/22 0428) BP: (151-195)/(66-76) 187/66 (08/22 0428) SpO2:  [96 %-99 %] 99 % (08/22 0428)  General - Well nourished, well developed, in no apparent distress.  Ophthalmologic - fundi not visualized due to noncooperation.  Cardiovascular - Regular rhythm and rate.  Mental Status -  Level of arousal and orientation to time, place, and person were intact. Language including expression, naming, repetition, comprehension was assessed and found intact.  Cranial  Nerves II - XII - II - Visual field intact OU. III, IV, VI - Extraocular movements intact. V - Facial sensation intact bilaterally. VII - Facial movement intact bilaterally. VIII - Hearing & vestibular intact bilaterally. X - Palate elevates symmetrically. XI - Chin turning & shoulder shrug intact bilaterally. XII - Tongue protrusion intact.  Motor Strength - The patients strength was normal in right UE. However, LUE 4/5 deltoid, bicep, tricep, 3/5 wrist extension and 2/5 finger grip, but no pronator drift. LLE 4+/5 proximal but 5/5 distal. RLE proximal 4+/5 but distal 3/5 due to pain.  Bulk was normal and fasciculations were absent.   Motor Tone - Muscle tone was assessed at the neck and appendages and was normal.  Reflexes - The patients reflexes were symmetrical in all extremities and she had no pathological reflexes.  Sensory - Light touch, temperature/pinprick were assessed and were symmetrical except painful skin touch on the right LE below the knee.    Coordination - The patient had normal movements in the right hand with no ataxia or dysmetria.  Tremor was absent.  Gait and Station - deferred.   ASSESSMENT/PLAN Ms. Ivelis Norgard is a 64 y.o. female with history of brain aneurysm repair and HLD, HTN and PAD  presenting with left arm pain, right leg pain, left arm weakness (some movements seen in left arm at times, other times she just let it fall to bed) following an abdominal arteriogram.  She did not receive IV t-PA due to outside of time window and unclear dx.  Right small frontal infarcts - embolic, likely related to procedure Possible posterior circulation infarct from left VA and subclavian A thrombus s/p angiogram with left brachial access  CT Head - No acute abnormality. Previous left ICA terminus region aneurysm clipping with chronic left inferior frontal gyrus encephalomalacia  MRI not able to perform due to aneurysm clip  CTA H&N - Fresh thrombus extending from the  left vertebral origin into the lumen of the left subclavian artery. 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 BA diminutive.   CT repeat 8/22 - Small recent cortical infarct at the the high right frontal lobe. Prior high left perirolandic infarct  2D Echo -EF 70 to 75%  Katherine Archer 2 - negative  LDL - 127  HgbA1c - 5.3  VTE prophylaxis - [place] heparin  No antithrombotic prior to admission, now on clopidogrel 75 mg daily and aspirin 325 DAPT for 3 months and then aspirin alone given large vessel occlusion.  Patient will be counseled to be compliant with her antithrombotic medications  Ongoing aggressive stroke risk factor management  Therapy recommendations:  CIR  Disposition:  Pending  Ischemic right lower extremity  Painful right lower extremity  Sensitive to touch on exam  Bilateral lower extremity severe PAD, planning for axillary bifemoral versus aortobifemoral bypass  in the near future by [facility]  Given rapid improvement, will discuss with Dr. Oneida Alar tomorrow about timing for RLE procedure  Multifocal vasculopathy  CT head and neck extensive atherosclerosis including aortic arch, and great vessels.  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  Moderate stenosis left P2, right A2 supraclinoid right ICA.  CTA head and neck also showed bilateral VA occlusion with distal recon, more at the right.  Right VA occlusion likely chronic.  BA diminutive.  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 Estanislado Pandy on Monday.  Hypertension  Home BP meds: hydralazine  Resumed home hydralazine 25 tid  Gradually decreased BP to goal within 3-5 days.  Avoid extreme BP elevation due to  saccular aneurysm at the Left ICA terminus.  Avoid low BP due to bilateral VA occlusion and diminutive basilar artery.  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 After hours, contact General Neurology

## 2019-08-25 NOTE — Progress Notes (Signed)
Inpatient Rehab Admissions Coordinator Note:   Per therapy recommendations, pt was screened for CIR candidacy by Clemens Catholic, [place] CCC-SLP. At this time, it is unclear if pt. Has had functional decline to require CIR level therapies. Will await neuro work up and additional treatment notes before placing consult order.    Clemens Catholic, [place], [place] Admissions Coordinator  [phone] ([place]) [phone] (office)

## 2019-08-26 ENCOUNTER — Other Ambulatory Visit: Payer: Self-pay | Admitting: *Deleted

## 2019-08-26 ENCOUNTER — Encounter (HOSPITAL_COMMUNITY): Payer: Self-pay | Admitting: Vascular Surgery

## 2019-08-26 ENCOUNTER — Telehealth: Payer: Self-pay | Admitting: *Deleted

## 2019-08-26 NOTE — Progress Notes (Signed)
error 

## 2019-08-26 NOTE — Progress Notes (Signed)
Physical Therapy Treatment Patient Details Name: Junice Fei MRN: [id number] DOB: Sep 02, 1955 Today's Date: 08/26/2019    History of Present Illness Pt is a 64 y/o female with history of brain aneurysm repair, HTN, and PAD; patient at outpatient center for abdominal aortogram procedure 8/20, after procedure noted L sided weakness and code stroke was called.  CT negative for acute abnormality. 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    PT Comments    Patient progressing well towards PT goals. Reports feeling stronger and better able to move LUE today. Tolerated gait training with steppage like gait due to right foot drop (premobid since June 2021) requiring Min guard assist for safety due to mild instability but no overt LOB. Encouraged walking to bathroom daily and functional use of LUE for all tasks. Awaiting vascular surgery consult. Discharge recommendation updated to outpatient PT pending further surgery. Will follow.    Follow Up Recommendations  Outpatient PT (pending vascular surgery)     Equipment Recommendations  None recommended by PT    Recommendations for Other Services       Precautions / Restrictions Precautions Precautions: Fall Precaution Comments: watch BP (permissive HTN systolic 287 - 867 per RN) Restrictions Weight Bearing Restrictions: No    Mobility  Bed Mobility Overal bed mobility: Needs Assistance Bed Mobility: Supine to Sit     Supine to sit: Min guard;HOB elevated     General bed mobility comments: for lines and safety, no physical assist required   Transfers Overall transfer level: Needs assistance Equipment used: None Transfers: Sit to/from Stand Sit to Stand: Min guard         General transfer comment: Min guard for safety; stood from EOB x1.  Ambulation/Gait Ambulation/Gait assistance: Min guard Gait Distance (Feet): 350 Feet Assistive  device: None Gait Pattern/deviations: Step-through pattern;Decreased stride length;Steppage;Decreased dorsiflexion - right Gait velocity: decreased Gait velocity interpretation: <1.8 ft/sec, indicate of risk for recurrent falls General Gait Details: Slow, mildly unsteady steppage like gait with foot drop on right; mild instability but no overt LOB.   Stairs             Wheelchair Mobility    Modified Rankin (Stroke Patients Only) Modified Rankin (Stroke Patients Only) Pre-Morbid Rankin Score: Slight disability Modified Rankin: Moderately severe disability     Balance Overall balance assessment: Needs assistance Sitting-balance support: Feet supported;No upper extremity supported Sitting balance-Leahy Scale: Good     Standing balance support: During functional activity Standing balance-Leahy Scale: Fair Standing balance comment: VAA                            Cognition Arousal/Alertness: Awake/alert Behavior During Therapy: WFL for tasks assessed/performed Overall Cognitive Status: Within Functional Limits for tasks assessed                                 General Comments: appears WFL, limited due to language barrier however granddaughter present and reports pt appears at her baseline, pt following commands appropriately and with good awareness throughout       Exercises Other Exercises Other Exercises: splint check performed start of session, pt tolerating splint wear approx 4.5 hours, reports no pain/discomfort, some mild swelling noted at L thumb which dissipated within approx 5-10 min, no other adverse reactions noted. educated pt/pt's granddaughter to continue monitoring  skin/hand for redness/swelling with splint wear and educated in plan for splint wear schedule (night time)     General Comments General comments (skin integrity, edema, etc.): VSS on RA; granddaughter present to help with interpreting.      Pertinent Vitals/Pain Pain  Assessment: Faces Faces Pain Scale: Hurts little more Pain Location: right foot with walking Pain Descriptors / Indicators: Sore;Sharp Pain Intervention(s): Monitored during session;Repositioned    Home Living                      Prior Function            PT Goals (current goals can now be found in the care plan section) Acute Rehab PT Goals Patient Stated Goal: get stronger  Progress towards PT goals: Progressing toward goals    Frequency    Min 4X/week      PT Plan Discharge plan needs to be updated    Co-evaluation              AM-PAC PT "6 Clicks" Mobility   Outcome Measure  Help needed turning from your back to your side while in a flat bed without using bedrails?: None Help needed moving from lying on your back to sitting on the side of a flat bed without using bedrails?: A Little Help needed moving to and from a bed to a chair (including a wheelchair)?: A Little Help needed standing up from a chair using your arms (e.g., wheelchair or bedside chair)?: A Little Help needed to walk in hospital room?: A Little Help needed climbing 3-5 steps with a railing? : A Little 6 Click Score: 19    End of Session Equipment Utilized During Treatment: Gait belt Activity Tolerance: Patient tolerated treatment well Patient left: in bed;with call bell/phone within reach;with family/visitor present Nurse Communication: Mobility status PT Visit Diagnosis: Muscle weakness (generalized) (M62.81);Difficulty in walking, not elsewhere classified (R26.2);Pain Pain - Right/Left: Right Pain - part of body: Ankle and joints of foot     Time: 1405-1420 PT Time Calculation (min) (ACUTE ONLY): 15 min  Charges:  $Gait Training: 8-22 mins                     Marisa Severin, PT, DPT Acute Rehabilitation Services Pager [phone] Office [place] 08/26/2019, 3:26 PM

## 2019-08-26 NOTE — Progress Notes (Addendum)
Occupational Therapy Treatment Patient Details Name: Katherine Archer MRN: [id number] DOB: 09-27-1955 Today's Date: 08/26/2019    History of present illness Pt is a 64 y/o female with history of brain aneurysm repair, HTN, and PAD; patient at outpatient center for abdominal aortogram procedure 8/20, after procedure noted L sided weakness and code stroke was called.  CT negative for acute abnormality. Workup pending.    OT comments  Pt presents supine in bed pleasant and willing to participate in therapy session. Initial part of session spent performing splint check of L resting hand splint. Pt tolerating splint wear well and denies pain/discomfort. Mild swelling noted at L thumb which dissipated within approx 5-10 min and with no other redness/swelling noted. Discussed with pt/pt's granddaughter plan to implement splint wear schedule and both in agreement. Pt participating in additional room level mobility and standing grooming ADL; overall performing mobility and ADL tasks without AD and minA throughout. Pt requiring increased time to incorporate LUE into functional task but with good demonstration of use of LUE as gross stabilizer throughout. Given pt's steady progress have updated d/c recommendations - feel she is now appropriate to return home and will greatly benefit from outpatient neuro OT services to further address LUE deficits. VSS throughout. Will continue per POC at this time.  Resting hand splint schedule to be posted in pt's room - pt to wear splint approx 6-8 hours at night and off during daytime hours.    Follow Up Recommendations  Outpatient OT;Supervision/Assistance - 24 hour (outpt neuro OT)    Equipment Recommendations  3 in 1 bedside commode          Precautions / Restrictions Precautions Precautions: Fall Precaution Comments: watch BP (permissive HTN systolic 222 - 979 per RN) Restrictions Weight Bearing Restrictions: No       Mobility Bed Mobility Overal bed mobility:  Needs Assistance Bed Mobility: Supine to Sit     Supine to sit: Min guard     General bed mobility comments: for lines and safety, no physical assist required   Transfers Overall transfer level: Needs assistance Equipment used: None Transfers: Sit to/from Stand Sit to Stand: Min guard         General transfer comment: for balance and safety given LE weakness/pain    Balance Overall balance assessment: Needs assistance Sitting-balance support: Feet supported Sitting balance-Leahy Scale: Good     Standing balance support: Single extremity supported;No upper extremity supported;During functional activity Standing balance-Leahy Scale: Fair Standing balance comment: able to maintain standing balance without UE support and close guarding for safety                           ADL either performed or assessed with clinical judgement   ADL Overall ADL's : Needs assistance/impaired     Grooming: Wash/dry hands;Wash/dry face;Oral care;Minimal assistance;Standing Grooming Details (indicate cue type and reason): assist for standing balance at sink; pt using LUE for gross grasp of items including opening/closing toothpaste and holding toothbrush while applying toothpaste                              Functional mobility during ADLs: Minimal assistance (progressed to minguard) General ADL Comments: pt now with L resting hand splint - focus of session on donning splint in order to initiate splint wear schedule. explained to both pt and pt's granddaughter reason for splint and plan for splint wear today.  Cognition Arousal/Alertness: Awake/alert Behavior During Therapy: WFL for tasks assessed/performed Overall Cognitive Status: Within Functional Limits for tasks assessed                                 General Comments: appears WFL, limited due to language barrier however granddaughter present and reports pt appears at  her baseline, pt following commands appropriately and with good awareness throughout         Exercises Exercises: Other exercises Other Exercises Other Exercises: splint check performed start of session, pt tolerating splint wear approx 4.5 hours, reports no pain/discomfort, some mild swelling noted at L thumb which dissipated within approx 5-10 min, no other adverse reactions noted. educated pt/pt's granddaughter to continue monitoring skin/hand for redness/swelling with splint wear and educated in plan for splint wear schedule (night time)    Shoulder Instructions       General Comments educated both pt/pt's granddaughter on continued use of LUE and incorporating LUE into activities throughout the day ; encuraged up to bathroom for toileting needs to promote continued mobility. VSS throughout on RA     Pertinent Vitals/ Pain       Pain Assessment: Faces Faces Pain Scale: Hurts little more Pain Location: RLE Pain Descriptors / Indicators: Discomfort;Sore Pain Intervention(s): Monitored during session;Repositioned;Premedicated before session  Home Living                                          Prior Functioning/Environment              Frequency  Min 3X/week        Progress Toward Goals  OT Goals(current goals can now be found in the care plan section)  Progress towards OT goals: Progressing toward goals  Acute Rehab OT Goals Patient Stated Goal: get stronger  OT Goal Formulation: With patient Time For Goal Achievement: 09/07/19 Potential to Achieve Goals: Good ADL Goals Pt Will Perform Grooming: with set-up;with supervision;sitting;standing Pt Will Perform Lower Body Dressing: with supervision;sit to/from stand;sitting/lateral leans Pt Will Transfer to Toilet: with supervision;bedside commode;stand pivot transfer;ambulating Pt Will Perform Toileting - Clothing Manipulation and hygiene: with supervision;sit to/from stand;sitting/lateral  leans Pt/caregiver will Perform Home Exercise Program: Increased ROM;Increased strength;Left upper extremity;With written HEP provided;With minimal assist Additional ADL Goal #1: Patient will tolerate resting hand splint for night time use (6-8 hours).  Plan Discharge plan needs to be updated    Co-evaluation                 AM-PAC OT "6 Clicks" Daily Activity     Outcome Measure   Help from another person eating meals?: A Little Help from another person taking care of personal grooming?: A Little Help from another person toileting, which includes using toliet, bedpan, or urinal?: A Lot Help from another person bathing (including washing, rinsing, drying)?: A Lot Help from another person to put on and taking off regular upper body clothing?: A Lot Help from another person to put on and taking off regular lower body clothing?: A Lot 6 Click Score: 14    End of Session Equipment Utilized During Treatment: Gait belt  OT Visit Diagnosis: Other abnormalities of gait and mobility (R26.89);Muscle weakness (generalized) (M62.81);Pain;Hemiplegia and hemiparesis Hemiplegia - Right/Left: Left Hemiplegia - dominant/non-dominant: Non-Dominant Pain - Right/Left: Right Pain - part of body: Leg;Ankle  and joints of foot   Activity Tolerance Patient tolerated treatment well   Patient Left in bed;with call bell/phone within reach;with family/visitor present   Nurse Communication Mobility status        Time: 1355-1405 OT Time Calculation (min): 10 min  Charges: OT General Charges $OT Visit: 1 Visit OT Treatments $Self Care/Home Management : 8-22 mins   Lou Cal, OT Acute Rehabilitation Services Pager [phone] Office [phone]    Raymondo Band 08/26/2019, 3:13 PM

## 2019-08-26 NOTE — Consult Note (Signed)
Referring Physician(s): Dr. Erlinda Hong  Supervising Physician: Luanne Bras  Patient Status:  [facility] - In-pt  Chief Complaint: Left ICA Aneurysm  Subjective: Patient in bed, no acute or apparent distress. Granddaughter is at the bedside, the patient's daughter was on the phone (speakerphone). The granddaughter acted as an an Astronomer during our visit. The patient states she does not have any complaints today.   Allergies: Amoxicillin and Penicillins  Medications: Prior to Admission medications   Medication Sig Start Date End Date Taking? Authorizing Provider  hydrALAZINE (APRESOLINE) 25 MG tablet Take 25 mg by mouth 3 (three) times daily. 08/16/19  Yes [provider]  traMADol (ULTRAM) 50 MG tablet Take 50 mg by mouth every 6 (six) hours as needed for moderate pain.  08/13/19  Yes [provider]     Vital Signs: BP (!) 162/69 (BP Location: Right Arm)   Pulse 69   Temp 97.6 F (36.4 C) (Oral)   Resp 18   Ht 5\' 6"  (1.676 m)   Wt 129 lb (58.5 kg)   SpO2 97%   BMI 20.82 kg/m   Physical Exam Constitutional:      General: She is not in acute distress. Cardiovascular:     Rate and Rhythm: Normal rate and regular rhythm.  Pulmonary:     Effort: Pulmonary effort is normal.  Musculoskeletal:     Comments: Left wrist in a brace.   Neurological:     Mental Status: She is alert and oriented to person, place, and time.     Imaging: CT Code Stroke CTA Head W/WO contrast  Result Date: 08/23/2019 CLINICAL DATA:  64 year old female code stroke presentation. Left side weakness and numbness. EXAM: CT ANGIOGRAPHY HEAD AND NECK TECHNIQUE: Multidetector CT imaging of the head and neck was performed using the standard protocol during bolus administration of intravenous contrast. Multiplanar CT image reconstructions and MIPs were obtained to evaluate the vascular anatomy. Carotid stenosis measurements (when applicable) are obtained utilizing NASCET criteria, using the  distal internal carotid diameter as the denominator. CONTRAST:  10mL OMNIPAQUE IOHEXOL 350 MG/ML SOLN COMPARISON:  Plain head CT 1426 hours today. FINDINGS: CTA NECK Skeleton: Absent dentition. Degenerative changes in the cervical spine with chronic or congenital interbody fusion at C6-C7. Previous left frontotemporal craniotomy. No acute osseous abnormality identified. Upper chest: Small volume retained secretions in the trachea and at the carina. Otherwise negative. Other neck: No acute findings. 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 Right carotid system: Brachiocephalic artery and right CCA plaque without significant stenosis. Extensive soft more so than calcified plaque at the right ICA origin and bulb, the less than 50 % stenosis with respect to the distal vessel. Left carotid system: Moderate mostly soft plaque affecting the proximal left CCA, including eccentric low-density plaque on series 5, image 142, but no hemodynamically significant stenosis. Intermittent plaque to the bifurcation. At the bifurcation only mild plaque at the left ICA origin and bulb. No left ICA stenosis to the skull base. Vertebral arteries: Plaque with 50% stenosis of the right subclavian artery origin. The right vertebral artery origin is identified on series 7, image 270 but the right vertebral is immediately occluded (image 268) and does not reconstitute until the V3 segment, where the vessel appears diminutive. Small volume of thrombus within the lumen of the left subclavian artery (series 8, image  169) related to complete occlusion of the left vertebral artery origin also. The left vertebral appears likely to be the dominant vessel, and is reconstituted beginning in the V2 segment, although with suboptimal enhancement until the skull base, where  vessel enhancement appears more robust. CTA HEAD Posterior circulation: Diminutive intracranial vertebrobasilar system, likely in part related to the bilateral vertebral thrombosis. Dominant although somewhat diminutive left vertebral artery V4 segment is patent to the vertebrobasilar junction. Diminutive right V4 and patent right PICA. Highly diminutive basilar artery (series 5, image 65). Although the basilar remains patent to the SCA is. There are fetal type bilateral PCA origins, however both posterior communicating arteries are diminutive. Bilateral PCA branches are patent. There is mild right and mild to moderate left P2 irregularity and stenosis. Anterior circulation: Both ICA siphons are patent. On the right there is moderate supraclinoid calcified plaque resulting in mild to moderate supraclinoid segment stenosis. The left siphon is patent with only mild supraclinoid calcified plaque. 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 This is located adjacent to the aneurysm clip. Both posterior communicating artery origins are patent. Patent MCA and ACA origins. Tortuous A1 segments. Anterior communicating artery and bilateral ACA branches are patent. There is a moderate stenosis of the right A2 segment on series 12, image 23. Left MCA M1 segment and bifurcation are patent without stenosis. Left MCA branches are within normal limits. Right MCA M1 segment and bifurcation are patent without stenosis. Right MCA branches are within normal limits. Venous sinuses: Patent. Anatomic variants: Fetal type bilateral PCA origins and suspected dominant left vertebral artery. Review of the MIP images confirms the above findings Preliminary results were discussed by telephone with Dr. Roland Rack at 1442 hours. IMPRESSION: 1. Positive for Bilateral Vertebral Artery Occlusion. Suspect the  Left Vertebral is dominant and there is fresh thrombus extending from the left vertebral origin into the lumen of the left subclavian artery. 2. Both vertebral arteries are suboptimally reconstituted distally, especially on the right. Furthermore, a highly diminutive appearance of the distal right vertebral artery and the basilar artery raising the possibility that the right vertebral occlusion might be chronic. 3. No large vessel occlusion identified in the anterior circulation. 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 4. Extensive atherosclerosis, including the aortic arch and great vessels. 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 5. Moderate stenoses of the Left PCA P2, Right ACA A2, supraclinoid Right ICA. Aortic Atherosclerosis (ICD10-I70.0). Electronically Signed   By: Genevie Ann M.D.   On: 08/23/2019 15:00   CT HEAD WO CONTRAST  Result Date: 08/25/2019 CLINICAL DATA:  Follow-up recent stroke EXAM: CT HEAD WITHOUT CONTRAST TECHNIQUE: Contiguous axial images were obtained from the base of the skull through the vertex without intravenous contrast. COMPARISON:  Two days ago FINDINGS: Brain: Small recent posterior and high right frontal cortex infarct. Small remote left perirolandic cortex infarct. 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 No acute hemorrhage, hydrocephalus, or masslike finding Vascular: No acute finding Skull: Unremarkable craniotomy Sinuses/Orbits: Negative IMPRESSION: 1. Small recent cortical infarct at the the high right frontal lobe. 2. Prior high left perirolandic infarct. 3. Aneurysm clipping along the left sided circle-of-Willis. Electronically Signed   By:  Monte Fantasia M.D.   On: 08/25/2019 07:22   CT Code Stroke CTA Neck W/WO contrast  Result Date: 08/23/2019 CLINICAL DATA:  64 year old female code stroke  presentation. Left side weakness and numbness. EXAM: CT ANGIOGRAPHY HEAD AND NECK TECHNIQUE: Multidetector CT imaging of the head and neck was performed using the standard protocol during bolus administration of intravenous contrast. Multiplanar CT image reconstructions and MIPs were obtained to evaluate the vascular anatomy. Carotid stenosis measurements (when applicable) are obtained utilizing NASCET criteria, using the distal internal carotid diameter as the denominator. CONTRAST:  58mL OMNIPAQUE IOHEXOL 350 MG/ML SOLN COMPARISON:  Plain head CT 1426 hours today. FINDINGS: CTA NECK Skeleton: Absent dentition. Degenerative changes in the cervical spine with chronic or congenital interbody fusion at C6-C7. Previous left frontotemporal craniotomy. No acute osseous abnormality identified. Upper chest: Small volume retained secretions in the trachea and at the carina. Otherwise negative. Other neck: No acute findings. 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 Right carotid system: Brachiocephalic artery and right CCA plaque without significant stenosis. Extensive soft more so than calcified plaque at the right ICA origin and bulb, the less than 50 % stenosis with respect to the distal vessel. Left carotid system: Moderate mostly soft plaque affecting the proximal left CCA, including eccentric low-density plaque on series 5, image 142, but no hemodynamically significant stenosis. Intermittent plaque to the bifurcation. At the bifurcation only mild plaque at the left ICA origin and bulb. No left ICA stenosis to the skull base. Vertebral arteries: Plaque with 50% stenosis of the right subclavian artery origin. The right vertebral artery origin is identified on series 7, image 270 but the right vertebral is immediately occluded (image 268) and  does not reconstitute until the V3 segment, where the vessel appears diminutive. Small volume of thrombus within the lumen of the left subclavian artery (series 8, image 169) related to complete occlusion of the left vertebral artery origin also. The left vertebral appears likely to be the dominant vessel, and is reconstituted beginning in the V2 segment, although with suboptimal enhancement until the skull base, where vessel enhancement appears more robust. CTA HEAD Posterior circulation: Diminutive intracranial vertebrobasilar system, likely in part related to the bilateral vertebral thrombosis. Dominant although somewhat diminutive left vertebral artery V4 segment is patent to the vertebrobasilar junction. Diminutive right V4 and patent right PICA. Highly diminutive basilar artery (series 5, image 65). Although the basilar remains patent to the SCA is. There are fetal type bilateral PCA origins, however both posterior communicating arteries are diminutive. Bilateral PCA branches are patent. There is mild right and mild to moderate left P2 irregularity and stenosis. Anterior circulation: Both ICA siphons are patent. On the right there is moderate supraclinoid calcified plaque resulting in mild to moderate supraclinoid segment stenosis. The left siphon is patent with only mild supraclinoid calcified plaque. 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 This is located adjacent to the aneurysm clip. Both posterior communicating artery origins are patent. Patent MCA and ACA origins. Tortuous A1 segments. Anterior communicating artery and bilateral ACA branches are patent. There is a moderate stenosis of the right A2 segment on series 12, image 23. Left MCA M1 segment and bifurcation  are patent without stenosis. Left MCA branches are within normal limits. Right MCA M1 segment and  bifurcation are patent without stenosis. Right MCA branches are within normal limits. Venous sinuses: Patent. Anatomic variants: Fetal type bilateral PCA origins and suspected dominant left vertebral artery. Review of the MIP images confirms the above findings Preliminary results were discussed by telephone with Dr. Roland Rack at 1442 hours. IMPRESSION: 1. Positive for Bilateral Vertebral Artery Occlusion. Suspect the Left Vertebral is dominant and there is fresh thrombus extending from the left vertebral origin into the lumen of the left subclavian artery. 2. Both vertebral arteries are suboptimally reconstituted distally, especially on the right. Furthermore, a highly diminutive appearance of the distal right vertebral artery and the basilar artery raising the possibility that the right vertebral occlusion might be chronic. 3. No large vessel occlusion identified in the anterior circulation. 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 4. Extensive atherosclerosis, including the aortic arch and great vessels. 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 5. Moderate stenoses of the Left PCA P2, Right ACA A2, supraclinoid Right ICA. Aortic Atherosclerosis (ICD10-I70.0). Electronically Signed   By: Genevie Ann M.D.   On: 08/23/2019 15:00   PERIPHERAL VASCULAR CATHETERIZATION  Result Date: 08/23/2019 Procedure: Ultrasound left arm, left brachial artery retrograde puncture, arch aortogram, abdominal aortogram with bilateral lower extremity runoff Preoperative diagnosis: Rest pain Postoperative diagnosis: Same Anesthesia: Local Operative findings: 1.  Bilateral external iliac artery occlusions 2.  Bilateral superficial femoral artery occlusions 3.  Normal aortic arch anatomy with patent subclavian and axillary arteries bilaterally 4.  Mild tibial disease but overall intact three-vessel  runoff bilaterally Operative details: After obtaining form consent, the patient taken the [facility] lab.  The patient was placed in supine just an angio table.  Patient's left upper extremities prepped and draped in usual sterile fashion.  Ultrasound was used to identify the patient's left brachial artery.  Local anesthesia was infiltrated over this.  A micropuncture needle was used to cannulate left brachial artery near the antecubital area.  Micropuncture wire was advanced into the brachial artery and the micropuncture sheath placed over this.  An 035 versa core wire was then advanced into the left subclavian artery and the micropuncture sheath was swapped out for a 6 French short sheath.  This was thoroughly flushed with 2000 units of heparin and 200 mcg nitroglycerin.  Next the guidewire was directed into the descending thoracic aorta and into the abdominal aorta and a 5 French pigtail catheter advanced over this.  Abdominal aortogram was then obtained in AP projection.  Infrarenal abdominal aorta is patent.  The left and right renal arteries are patent.  The left and right common and internal iliac arteries are patent.  The external iliac arteries are occluded bilaterally.  There are large collaterals coming out of the internal iliacs bilaterally which reconstitute the common femoral artery and profunda distally.  Next the pigtail catheter was advanced deeper into the aorta and bilateral oblique views of the pelvis were performed which confirmed the above findings.  At this point we went back to AP projection and bilateral lower extremity runoff views were obtained through the pigtail catheter. In the left lower extremity, the left common femoral artery does not opacify much the profunda does opacify there is a flush occlusion of the left superficial femoral artery and the above-knee popliteal artery  does reconstitute via collaterals.  There is three-vessel runoff to the right foot there is mild tibial disease. In the  right lower extremity, there are similar findings. At this point the pigtail catheter was pulled back and redirected into the ascending aorta.  An arch aortogram was then obtained in a 40 degree LAO projection.  This shows normal type I arch anatomy with some tortuosity of the subclavian arteries bilaterally but overall no narrowing.  The axillary and subclavian arteries are widely patent bilaterally. This point the pigtail catheter was removed over guidewire.  The sheath was left in place in the left brachial artery to be pulled in the holding area.  The patient tolerated procedure well and there were no complications.  Patient was taken the holding area in stable condition. Operative management: The patient will be scheduled in the near future for axillary bifemoral versus aortobifemoral bypass pending her cardiac and pulmonary evaluations. Ruta Hinds, MD [facility] Office: [phone]   ECHOCARDIOGRAM COMPLETE  Result Date: 08/24/2019    ECHOCARDIOGRAM REPORT   Patient Name:   Katherine Archer Date of Exam: 08/24/2019 Medical Rec #:  [id number]   Height:       66.0 in Accession #:    [id number]  Weight:       129.0 lb Date of Birth:  11-30-1955    BSA:          1.660 m Patient Age:    65 years    BP:           176/57 mmHg Patient Gender: F           HR:           58 bpm. Exam Location:  Inpatient Procedure: 2D Echo, Cardiac Doppler and Color Doppler Indications:    Stroke  History:        Patient has no prior history of Echocardiogram examinations.                 Risk Factors:Current Smoker, Dyslipidemia and Hypertension. PAD.  Sonographer:    Clayton Lefort RDCS (AE) Referring Phys: [phone] [place] [address] [place]  1. Left ventricular ejection fraction, by estimation, is 70 to 75%. The left ventricle has hyperdynamic function. The left ventricle has no regional wall motion abnormalities. There is moderate concentric left ventricular hypertrophy. Left ventricular diastolic  parameters are consistent with Grade I diastolic dysfunction (impaired relaxation). Elevated left atrial pressure.  2. Right ventricular systolic function is normal. The right ventricular size is normal. There is moderately elevated pulmonary artery systolic pressure.  3. The mitral valve is normal in structure. Mild mitral valve regurgitation. No evidence of mitral stenosis.  4. The aortic valve is normal in structure. Aortic valve regurgitation is not visualized. No aortic stenosis is present.  5. The inferior vena cava is dilated in size with <50% respiratory variability, suggesting right atrial pressure of 15 mmHg. Conclusion(s)/Recommendation(s): No intracardiac source of embolism detected on this transthoracic study. A transesophageal echocardiogram is recommended to exclude cardiac source of embolism if clinically indicated. FINDINGS  Left Ventricle: Left ventricular ejection fraction, by estimation, is 70 to 75%. The left ventricle has hyperdynamic function. The left ventricle has no regional wall motion abnormalities. The left ventricular internal cavity size was normal in size. There is moderate concentric left ventricular hypertrophy. Left ventricular diastolic parameters are consistent with Grade I diastolic dysfunction (impaired relaxation). Elevated left atrial pressure. Right Ventricle: The right ventricular size is normal. No increase in  right ventricular wall thickness. Right ventricular systolic function is normal. There is moderately elevated pulmonary artery systolic pressure. The tricuspid regurgitant velocity is 2.80 m/s, and with an assumed right atrial pressure of 15 mmHg, the estimated right ventricular systolic pressure is 18.8 mmHg. Left Atrium: Left atrial size was normal in size. Right Atrium: Right atrial size was normal in size. Pericardium: There is no evidence of pericardial effusion. Mitral Valve: The mitral valve is normal in structure. Normal mobility of the mitral valve leaflets.  Mild mitral valve regurgitation. No evidence of mitral valve stenosis. MV peak gradient, 5.7 mmHg. The mean mitral valve gradient is 1.0 mmHg. Tricuspid Valve: The tricuspid valve is normal in structure. Tricuspid valve regurgitation is mild . No evidence of tricuspid stenosis. Aortic Valve: The aortic valve is normal in structure. Aortic valve regurgitation is not visualized. No aortic stenosis is present. Aortic valve mean gradient measures 5.0 mmHg. Aortic valve peak gradient measures 11.6 mmHg. Aortic valve area, by VTI measures 2.00 cm. Pulmonic Valve: The pulmonic valve was normal in structure. Pulmonic valve regurgitation is not visualized. No evidence of pulmonic stenosis. Aorta: The aortic root is normal in size and structure. Venous: The inferior vena cava is dilated in size with less than 50% respiratory variability, suggesting right atrial pressure of 15 mmHg. IAS/Shunts: No atrial level shunt detected by color flow Doppler.  LEFT VENTRICLE PLAX 2D LVIDd:         4.30 cm  Diastology LVIDs:         2.80 cm  LV e' lateral:   6.20 cm/s LV PW:         1.20 cm  LV E/e' lateral: 17.7 LV IVS:        1.70 cm  LV e' medial:    6.64 cm/s LVOT diam:     1.80 cm  LV E/e' medial:  16.6 LV SV:         77 LV SV Index:   46 LVOT Area:     2.54 cm  RIGHT VENTRICLE             IVC RV Basal diam:  3.20 cm     IVC diam: 2.60 cm RV S prime:     16.00 cm/s TAPSE (M-mode): 2.2 cm LEFT ATRIUM             Index       RIGHT ATRIUM           Index LA diam:        3.20 cm 1.93 cm/m  RA Area:     16.30 cm LA Vol (A2C):   67.1 ml 40.43 ml/m RA Volume:   44.00 ml  26.51 ml/m LA Vol (A4C):   48.4 ml 29.16 ml/m LA Biplane Vol: 59.9 ml 36.09 ml/m  AORTIC VALVE AV Area (Vmax):    2.28 cm AV Area (Vmean):   1.99 cm AV Area (VTI):     2.00 cm AV Vmax:           170.00 cm/s AV Vmean:          108.000 cm/s AV VTI:            0.383 m AV Peak Grad:      11.6 mmHg AV Mean Grad:      5.0 mmHg LVOT Vmax:         152.00 cm/s LVOT Vmean:         84.300 cm/s LVOT VTI:  0.301 m LVOT/AV VTI ratio: 0.79  AORTA Ao Root diam: 3.10 cm Ao Asc diam:  3.10 cm MITRAL VALVE                TRICUSPID VALVE MV Area (PHT): 3.37 cm     TR Peak grad:   31.4 mmHg MV Peak grad:  5.7 mmHg     TR Vmax:        280.00 cm/s MV Mean grad:  1.0 mmHg MV Vmax:       1.19 m/s     SHUNTS MV Vmean:      51.8 cm/s    Systemic VTI:  0.30 m MV Decel Time: 225 msec     Systemic Diam: 1.80 cm MV E velocity: 110.00 cm/s MV A velocity: 72.20 cm/s MV E/A ratio:  1.52 Ena Dawley MD Electronically signed by Ena Dawley MD Signature Date/Time: 08/24/2019/11:51:59 AM    Final    CT HEAD CODE STROKE WO CONTRAST  Result Date: 08/23/2019 CLINICAL DATA:  Code stroke.  64 year old female left side weakness. EXAM: CT HEAD WITHOUT CONTRAST TECHNIQUE: Contiguous axial images were obtained from the base of the skull through the vertex without intravenous contrast. COMPARISON:  Head CT 11/05/2013. 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 Cerebral volume is not significantly changed since 2015. No midline shift, mass effect, or evidence of intracranial mass lesion. No ventriculomegaly. No acute intracranial hemorrhage identified. No cortically based acute infarct identified. Vascular: Left ICA terminus region aneurysm clip. Mild Calcified atherosclerosis at the skull base. No suspicious intracranial vascular hyperdensity. Skull: Previous left frontotemporal craniotomy. No acute osseous abnormality identified. Sinuses/Orbits: Visualized paranasal sinuses and mastoids are stable and well pneumatized. Other: No acute orbit or scalp soft tissue finding. ASPECTS [facility] Stroke Program Early CT Score) Total score (0-10 with 10 being normal): 10 IMPRESSION: 1. No acute cortically based infarct or acute intracranial hemorrhage identified. 2. Previous left ICA terminus region aneurysm clipping with chronic left  inferior frontal gyrus encephalomalacia. 3. These results were communicated to Dr. Leonel Ramsay at 2:32 pm on 08/23/2019 by text page via the [facility] messaging system. Electronically Signed   By: Genevie Ann M.D.   On: 08/23/2019 14:33    Labs:  CBC: Recent Labs    08/23/19 0753 08/23/19 1816  WBC  --  6.4  HGB 13.3 14.4  HCT 39.0 43.7  PLT  --  156    COAGS: Recent Labs    08/23/19 1423  INR 1.1  APTT 31    BMP: Recent Labs    08/23/19 0753 08/23/19 1816  NA 138 139  K 4.5 4.1  CL 104 107  CO2  --  23  GLUCOSE 102* 97  BUN 8 7*  CALCIUM  --  9.5  CREATININE 0.70 0.76  GFRNONAA  --  >60  GFRAA  --  >60    LIVER FUNCTION TESTS: Recent Labs    08/23/19 1816  BILITOT 0.7  AST 14*  ALT 15  ALKPHOS 76  PROT 6.7  ALBUMIN 3.7    Assessment and Plan:  Left ICA Aneurysm: Patient admitted 08/23/19 for scheduled procedure with Vascular Surgery to address peripheral arterial disease with right foot ischemia and pain at rest. She has a known history of a brain aneurysm with clipping (done in [place], 1998). On 08/23/19, the patient underwent a left brachial artery catheterization for an arch and abdominal aortogram with bilateral lower extremity run off. A few hours  after her sheath was pulled she developed left hand spasms and flaccid paralysis. A code stroke was called and a CTA was done: "Positive for Bilateral Vertebral Artery Occlusion. Suspect the Left Vertebral is dominant and there is fresh thrombus extending from the left vertebral origin into the lumen of the left subclavian artery." The patient was outside the window for TPA and her symptoms were improving; no intervention was performed. 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 Neurovascular Interventional Radiology consulted regarding left ICA aneurysm.  Dr. Estanislado Pandy spoke with the patient, her daughter and granddaughter at the bedside today and discussed the  left ICA aneurysm and possible endovascular treatment in view of patients previous history of ruptured intracranial aneurysm. Vascular Surgery has tentative plans for an axillary bifemoral versus aortobifemoral bypass pending cardiac and pulmonary evaluations. Dr Estanislado Pandy will  follow up with the patient after this procedure. The patient and her family are in agreement with this plan.   Electronically Signed: Soyla Dryer, AGACNP-BC [phone] 08/26/2019, 12:06 PM   I spent a total of 25 Minutes at the the patient's bedside AND on the patient's hospital floor or unit, greater than 50% of which was counseling/coordinating care for left ICA aneurysm

## 2019-08-26 NOTE — Telephone Encounter (Signed)
Patients daughter [name] called requesting medical release form so her mother can get her office notes from [place]. Form emailed to patient daughter.

## 2019-08-26 NOTE — Progress Notes (Addendum)
   VASCULAR SURGERY ASSESSMENT & PLAN:   POD 3ARTERIOGRAM VIA LEFT BRACHIAL APPROACH: The patient has left dorsal foot rest pain. Patient underwent an arteriogram on Friday. She had strokelike symptoms after the procedure and the code stroke was called. Admitted and neuro consult obtained. Plavix, asa, statin continue. (Once she is recovered sufficiently, she will need axillary bifemoral versus aortobifemoral bypass pending her cardiac and pulmonary evaluations.)  Stroke:Possible posterior circulation infarct from left VA and subclavian thrombus s/p angiogram with left brachial access. The stroke team is following. LUE weakness as displayed by wrist drop. ST has cleared for regular diet.  Disposition: Inpatient rehab coordinator following up on possible CIR placement.   SUBJECTIVE:   Continues with complaints of right dorsal foot pain.  PHYSICAL EXAM:   Vitals:   08/25/19 2029 08/25/19 2109 08/26/19 0125 08/26/19 0621  BP: (!) 211/77 (!) 186/67 (!) 154/64 (!) 146/54  Pulse: 74 89 87 67  Resp: (!) 21 (!) 21 16 18   Temp: 98.1 F (36.7 C)  98.8 F (37.1 C) 98.8 F (37.1 C)  TempSrc: Oral  Oral Oral  SpO2:  99% 97% 97%  Weight:      Height:       General appearance: Well-developed, well-nourished in no apparent distress.   Neurologic: Alert and oriented.  Follows commands.  No facial asymmetry. Cardiac: Rate and rhythm are regular Lungs: Clear to auscultation bilaterally Upper extremities: 5 out of 5 right hand grip strength.  Able to raise both arms.  Can flex left fingers, but not grip.  2+ radial pulse.  Left brachial artery cannulation site without hematoma. Lower extremities: Able to raise both legs from the bed.  Diminished bilateral femoral pulses. Right peroneal Doppler signal only.  No tissue loss of either foot.  Motor and sensation intact.   LABS:   Lab Results  Component Value Date   WBC 6.4 08/23/2019   HGB 14.4 08/23/2019   HCT 43.7 08/23/2019   MCV 95.4  08/23/2019   PLT 156 08/23/2019   Lab Results  Component Value Date   CREATININE 0.76 08/23/2019   Lab Results  Component Value Date   INR 1.1 08/23/2019   CBG (last 3)  Recent Labs    08/23/19 1405  GLUCAP 87    PROBLEM LIST:    Active Problems:   Cervical polyp   Encounter for screening mammogram for breast cancer   Fibrocystic breast changes of both breasts   Stroke [facility])   PAD (peripheral artery disease) (HCC)   CURRENT MEDS:   . aspirin EC  325 mg Oral Daily  . atorvastatin  80 mg Oral q1800  . clopidogrel  75 mg Oral Daily  . heparin  5,000 Units Subcutaneous Q8H  . hydrALAZINE  25 mg Oral TID  . pantoprazole  40 mg Oral Daily  . potassium chloride  20-40 mEq Oral Once  . sodium chloride flush  3 mL Intravenous Q12H    Barbie Banner, [place] Office: [phone] 08/26/2019    Agree with above.  Pt is on OR schedule for 09/13/19 for right Ax-bifem.  Plan for d/c next 1-3 days as blood pressure stabilizes.  Daughter updated by phone.  Ruta Hinds, MD [facility] Office: [phone]

## 2019-08-26 NOTE — Progress Notes (Addendum)
STROKE TEAM PROGRESS NOTE   INTERVAL HISTORY Granddaughter is at the bedside and daughter on the phone. Pt AAO x 3, left sided weakness much improved, still has weakness on the wrist and left hand but improving. On left wrist brace. Discussed with VVS PA. If the right LE procedure is emergent or urgent for ischemic leg, we are Ok from neuro standpoint.   OBJECTIVE Vitals:   08/25/19 2109 08/26/19 0125 08/26/19 0621 08/26/19 0829  BP: (!) 186/67 (!) 154/64 (!) 146/54 (!) 162/69  Pulse: 89 87 67 69  Resp: (!) 21 16 18 18   Temp:  98.8 F (37.1 C) 98.8 F (37.1 C) 97.6 F (36.4 C)  TempSrc:  Oral Oral Oral  SpO2: 99% 97% 97% 97%  Weight:      Height:        CBC:  Recent Labs  Lab 08/23/19 0753 08/23/19 1816  WBC  --  6.4  HGB 13.3 14.4  HCT 39.0 43.7  MCV  --  95.4  PLT  --  761    Basic Metabolic Panel:  Recent Labs  Lab 08/23/19 0753 08/23/19 1816  NA 138 139  K 4.5 4.1  CL 104 107  CO2  --  23  GLUCOSE 102* 97  BUN 8 7*  CREATININE 0.70 0.76  CALCIUM  --  9.5    Lipid Panel:     Component Value Date/Time   CHOL 178 08/24/2019 0142   TRIG 65 08/24/2019 0142   HDL 38 (L) 08/24/2019 0142   CHOLHDL 4.7 08/24/2019 0142   VLDL 13 08/24/2019 0142   LDLCALC 127 (H) 08/24/2019 0142   HgbA1c:  Lab Results  Component Value Date   HGBA1C 5.3 08/24/2019   Urine Drug Screen: No results found for: LABOPIA, COCAINSCRNUR, LABBENZ, AMPHETMU, THCU, LABBARB  Alcohol Level No results found for: [place] Code Stroke CTA Head W/WO contrast CT Code Stroke CTA Neck W/WO contrast 08/23/2019 IMPRESSION:  1. Positive for Bilateral Vertebral Artery Occlusion. Suspect the Left Vertebral is dominant and there is fresh thrombus extending from the left vertebral origin into the lumen of the left subclavian artery.  2. Both vertebral arteries are suboptimally reconstituted distally, especially on the right. Furthermore, a highly diminutive appearance of the distal right  vertebral artery and the basilar artery raising the possibility that the right vertebral occlusion might be chronic.  3. No large vessel occlusion identified in the anterior circulation. 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  4. Extensive atherosclerosis, including the aortic arch and great vessels. 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  5. Moderate stenoses of the Left PCA P2, Right ACA A2, supraclinoid Right ICA.  6. Aortic Atherosclerosis (ICD10-I70.0)  CT HEAD CODE STROKE WO CONTRAST 08/23/2019 IMPRESSION:  1. No acute cortically based infarct or acute intracranial hemorrhage identified.  2. Previous left ICA terminus region aneurysm clipping with chronic left inferior frontal gyrus encephalomalacia  PERIPHERAL VASCULAR CATHETERIZATION 08/23/2019 Procedure: Ultrasound left arm, left brachial artery retrograde puncture, arch aortogram, abdominal aortogram with bilateral lower extremity runoff Preoperative diagnosis: Rest pain Postoperative diagnosis: Same Anesthesia: Local Operative findings: 1.  Bilateral external iliac artery occlusions 2.  Bilateral superficial femoral artery occlusions 3.  Normal aortic arch anatomy with patent subclavian and axillary arteries bilaterally 4.  Mild tibial disease but overall intact three-vessel runoff bilaterally Operative  details: After obtaining form consent, the patient taken the [place] lab.  The patient was placed in supine just an angio table.  Patient's left upper extremities prepped and draped in usual sterile fashion.  Ultrasound was used to identify the patient's left brachial artery.  Local anesthesia was infiltrated over this.  A micropuncture needle was used to cannulate left brachial artery near the antecubital area.  Micropuncture wire was advanced into the brachial artery and the micropuncture sheath placed over this.   An 035 versa core wire was then advanced into the left subclavian artery and the micropuncture sheath was swapped out for a 6 French short sheath.  This was thoroughly flushed with 2000 units of heparin and 200 mcg nitroglycerin.  Next the guidewire was directed into the descending thoracic aorta and into the abdominal aorta and a 5 French pigtail catheter advanced over this.  Abdominal aortogram was then obtained in AP projection.  Infrarenal abdominal aorta is patent.  The left and right renal arteries are patent.  The left and right common and internal iliac arteries are patent.  The external iliac arteries are occluded bilaterally.  There are large collaterals coming out of the internal iliacs bilaterally which reconstitute the common femoral artery and profunda distally.  Next the pigtail catheter was advanced deeper into the aorta and bilateral oblique views of the pelvis were performed which confirmed the above findings.  At this point we went back to AP projection and bilateral lower extremity runoff views were obtained through the pigtail catheter. In the left lower extremity, the left common femoral artery does not opacify much the profunda does opacify there is a flush occlusion of the left superficial femoral artery and the above-knee popliteal artery does reconstitute via collaterals.  There is three-vessel runoff to the right foot there is mild tibial disease. In the right lower extremity, there are similar findings. At this point the pigtail catheter was pulled back and redirected into the ascending aorta.  An arch aortogram was then obtained in a 40 degree LAO projection.  This shows normal type I arch anatomy with some tortuosity of the subclavian arteries bilaterally but overall no narrowing.  The axillary and subclavian arteries are widely patent bilaterally. This point the pigtail catheter was removed over guidewire.  The sheath was left in place in the left brachial artery to be pulled in the  holding area.  The patient tolerated procedure well and there were no complications.  Patient was taken the holding area in stable condition. Operative management: The patient will be scheduled in the near future for axillary bifemoral versus aortobifemoral bypass pending her cardiac and pulmonary evaluations. Ruta Hinds, MD [facility] Office: [phone]   Transthoracic Echocardiogram  1. Left ventricular ejection fraction, by estimation, is 70 to 75%. The  left ventricle has hyperdynamic function. The left ventricle has no  regional wall motion abnormalities. There is moderate concentric left  ventricular hypertrophy. Left ventricular  diastolic parameters are consistent with Grade I diastolic dysfunction  (impaired relaxation). Elevated left atrial pressure.  2. Right ventricular systolic function is normal. The right ventricular  size is normal. There is moderately elevated pulmonary artery systolic  pressure.  3. The mitral valve is normal in structure. Mild mitral valve  regurgitation. No evidence of mitral stenosis.  4. The aortic valve is normal in structure. Aortic valve regurgitation is  not visualized. No aortic stenosis is present.  5. The inferior vena cava is dilated in size with <  50% respiratory  variability, suggesting right atrial pressure of 15 mmHg.   ECG - SR rate 65 BPM. Prolonged QT interval (See cardiology reading for complete details)  PHYSICAL EXAM  Temp:  [97.6 F (36.4 C)-98.8 F (37.1 C)] 97.6 F (36.4 C) (08/23 0829) Pulse Rate:  [65-89] 69 (08/23 0829) Resp:  [16-21] 18 (08/23 0829) BP: (146-211)/(54-83) 162/69 (08/23 0829) SpO2:  [97 %-99 %] 97 % (08/23 0829)  General - Well nourished, well developed, in no apparent distress.  Ophthalmologic - fundi not visualized due to noncooperation.  Cardiovascular - Regular rhythm and rate.  Mental Status -  Level of arousal and orientation to time, place, and person  were intact. Language including expression, naming, repetition, comprehension was assessed and found intact.  Cranial Nerves II - XII - II - Visual field intact OU. III, IV, VI - Extraocular movements intact. V - Facial sensation intact bilaterally. VII - Facial movement intact bilaterally. VIII - Hearing & vestibular intact bilaterally. X - Palate elevates symmetrically. XI - Chin turning & shoulder shrug intact bilaterally. XII - Tongue protrusion intact.  Motor Strength - The patient's strength was normal in right UE. However, LUE 4+/5 deltoid, bicep, tricep, 3/5 wrist extension and 3/5 finger grip, but no pronator drift. LLE 5/5 proximal and distal. RLE proximal 4+/5 but distal 3/5 due to pain.  Bulk was normal and fasciculations were absent.   Motor Tone - Muscle tone was assessed at the neck and appendages and was normal.  Reflexes - The patient's reflexes were symmetrical in all extremities and she had no pathological reflexes.  Sensory - Light touch, temperature/pinprick were assessed and were symmetrical except painful skin touch on the right LE below the knee.    Coordination - The patient had normal movements in the right hand with no ataxia or dysmetria.  Tremor was absent.  Gait and Station - deferred.   ASSESSMENT/PLAN Katherine Archer is a 64 y.o. female with history of brain aneurysm repair and HLD, HTN and PAD  presenting with left arm pain, right leg pain, left arm weakness (some movements seen in left arm at times, other times she just let it fall to bed) following an abdominal arteriogram.  She did not receive IV t-PA due to outside of time window and unclear dx.  Right small frontal infarcts - embolic, likely related to procedure Possible posterior circulation infarct from left VA and subclavian A thrombus s/p angiogram with left brachial access  CT Head - No acute abnormality. Previous left ICA terminus region aneurysm clipping with chronic left inferior frontal  gyrus encephalomalacia  MRI not able to perform due to aneurysm clip  CTA H&N - Fresh thrombus extending from the left vertebral origin into the lumen of the left subclavian artery. 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 BA diminutive.   CT repeat 8/22 - Small recent cortical infarct at the the high right frontal lobe. Prior high left perirolandic infarct  2D Echo -EF 70 to 75%  [employer] Virus 2 - negative  LDL - 127  HgbA1c - 5.3  VTE prophylaxis -  heparin  No antithrombotic prior to admission, now on clopidogrel 75 mg daily and aspirin 325 DAPT for 3 months and then aspirin alone given large vessel occlusion.  Patient will be counseled to be compliant with her antithrombotic medications  Ongoing aggressive stroke risk factor management  Therapy recommendations:  CIR  Disposition:  Pending  Ischemic right lower extremity  Painful  right lower extremity  Sensitive to touch on exam  Bilateral lower extremity severe PAD, planning for axillary bifemoral versus aortobifemoral bypass in the near future by VVS  Discussed with VVS PA, given rapid improvement from stroke, if right ischemic leg procedure is urgent or emergent, it is Ok from neuro standpoint to proceed. Recommend avoid low BP during procedure given atherosclerosis of cerebral vasculature.   Multifocal vasculopathy  CT head and neck extensive atherosclerosis including aortic arch, and great vessels.  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  Moderate stenosis left P2, right A2, supraclinoid right ICA.  CTA head and neck also showed bilateral VA occlusion with distal recon, more at the right.  Right VA occlusion likely chronic.  BA diminutive.  Severe PAD bilaterally  On DAPT now  Cerebral aneurysm  History of left terminal ICA aneurysm rupture status post clipping in [place] in 1998  Current CTA head and neck showed recurrent left ICA 7 to 8 mm saccular  aneurysm adjacent to previous aneurysm  Dr. Estanislado Pandy was consulted today for further planning.  Hypertension  Home BP meds: hydralazine  Resumed home hydralazine 25 tid  Gradually decreased BP to goal within 3-5 days. . Avoid extreme BP elevation due to saccular aneurysm at the Left ICA terminus. . Avoid low BP due to bilateral VA occlusion and diminutive basilar artery. . 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 I also discussed with VVS PA and Dr. Estanislado Pandy from IR.  Neurology will sign off. Please call with questions. Pt will follow up with stroke clinic NP at [facility] in about 4 weeks. Thanks for the consult.   Rosalin Hawking, MD PhD Stroke Neurology 08/26/2019 10:21 AM   To contact Stroke Continuity provider, please refer to [email or web address] After hours, contact General Neurology

## 2019-08-26 NOTE — Progress Notes (Addendum)
Occupational Therapy Treatment Patient Details Name: Katherine Archer MRN: [id number] DOB: 1955-01-09 Today's Date: 08/26/2019    History of present illness Pt is a 64 y/o female with history of brain aneurysm repair, HTN, and PAD; patient at outpatient center for abdominal aortogram procedure 8/20, after procedure noted L sided weakness and code stroke was called.  CT negative for acute abnormality. Workup pending.    OT comments  Pt seen initially to address resting hand splint needs for LUE given continued weakness and flexor tone. Pt recently with resting hand splint donned prior to arrival, ensured proper fit of splint and educated both pt/pt's granddaughter in purpose/benefits of splint as well as plan for implementing splint wear schedule. Instructed pt to wear for approx 4 hours today and OT to follow up to assess for tolerance with pt verbalizing understanding. Pt tolerating donning of splint without c/o pain or discomfort. Will follow up for splint check/OT treatment session later today.    Follow Up Recommendations  CIR;Supervision/Assistance - 24 hour    Equipment Recommendations  3 in 1 bedside commode          Precautions / Restrictions Precautions Precautions: Fall Precaution Comments: watch BP (permissive HTN systolic 160 - 737 per RN) Restrictions Weight Bearing Restrictions: No              ADL either performed or assessed with clinical judgement   ADL Overall ADL's : Needs assistance/impaired                                       General ADL Comments: pt now with L resting hand splint - focus of session on donning splint in order to initiate splint wear schedule. explained to both pt and pt's granddaughter reason for splint and plan for splint wear today.                        Cognition Arousal/Alertness: Awake/alert Behavior During Therapy: WFL for tasks assessed/performed Overall Cognitive Status: Within Functional Limits for tasks  assessed                                 General Comments: appears WFL, limited due to language barrier        Exercises     Shoulder Instructions       General Comments      Pertinent Vitals/ Pain       Pain Assessment: Faces Faces Pain Scale: No hurt Pain Intervention(s): Monitored during session  Home Living                                          Prior Functioning/Environment              Frequency  Min 3X/week        Progress Toward Goals  OT Goals(current goals can now be found in the care plan section)  Progress towards OT goals: Progressing toward goals  Acute Rehab OT Goals Patient Stated Goal: get stronger  OT Goal Formulation: With patient Time For Goal Achievement: 09/07/19 Potential to Achieve Goals: Good ADL Goals Pt Will Perform Grooming: with set-up;with supervision;sitting;standing Pt Will Perform Lower Body Dressing: with supervision;sit to/from stand;sitting/lateral leans Pt Will Transfer  to Toilet: with supervision;bedside commode;stand pivot transfer;ambulating Pt Will Perform Toileting - Clothing Manipulation and hygiene: with supervision;sit to/from stand;sitting/lateral leans Pt/caregiver will Perform Home Exercise Program: Increased ROM;Increased strength;Left upper extremity;With written HEP provided;With minimal assist Additional ADL Goal #1: Patient will tolerate resting hand splint for night time use (6-8 hours).  Plan Discharge plan remains appropriate    Co-evaluation                 AM-PAC OT "6 Clicks" Daily Activity     Outcome Measure   Help from another person eating meals?: A Little Help from another person taking care of personal grooming?: A Little Help from another person toileting, which includes using toliet, bedpan, or urinal?: A Lot Help from another person bathing (including washing, rinsing, drying)?: A Lot Help from another person to put on and taking off regular  upper body clothing?: A Lot Help from another person to put on and taking off regular lower body clothing?: A Lot 6 Click Score: 14    End of Session    OT Visit Diagnosis: Other abnormalities of gait and mobility (R26.89);Muscle weakness (generalized) (M62.81);Pain;Hemiplegia and hemiparesis Hemiplegia - Right/Left: Left Hemiplegia - dominant/non-dominant: Non-Dominant Pain - Right/Left: Right Pain - part of body: Leg;Ankle and joints of foot   Activity Tolerance Patient tolerated treatment well   Patient Left in bed;with call bell/phone within reach;with family/visitor present   Nurse Communication Mobility status        Time: 0921-0930 OT Time Calculation (min): 9 min  Charges: OT General Charges $OT Visit: 1 Visit OT Treatments $Orthotics Fit/Training: 8-22 mins  Lou Cal, OT Acute Rehabilitation Services Pager [phone] Office [phone]   Raymondo Band 08/26/2019, 1:50 PM

## 2019-08-27 MED ORDER — ASPIRIN 325 MG PO TBEC
325.0000 mg | DELAYED_RELEASE_TABLET | Freq: Every day | ORAL | 0 refills | Status: DC
Start: 2019-08-28 — End: 2019-10-16

## 2019-08-27 MED ORDER — ATORVASTATIN CALCIUM 80 MG PO TABS
80.0000 mg | ORAL_TABLET | Freq: Every day | ORAL | 2 refills | Status: DC
Start: 2019-08-27 — End: 2019-11-18

## 2019-08-27 MED ORDER — CLOPIDOGREL BISULFATE 75 MG PO TABS: 75.0000 mg | ORAL_TABLET | Freq: Every day | ORAL | 0 refills | Status: AC

## 2019-08-27 NOTE — Progress Notes (Signed)
Physical Therapy Treatment Patient Details Name: Katherine Archer MRN: [id number] DOB: 1955-02-15 Today's Date: 08/27/2019    History of Present Illness Pt is a 64 y/o female with history of brain aneurysm repair, HTN, and PAD; patient at outpatient center for abdominal aortogram procedure 8/20, after procedure noted L sided weakness and code stroke was called.  CT negative for acute abnormality. 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    PT Comments    Pt very pleasant and moving well. Pt reports RLE pain with increased time in standing and that foot drop has not changed from PTA. Pt without LOB and increased gait and stair tolerance this session. Pt reports comfort with return home and no additional DMe needs.   [employer] interpreter Mckinley Jewel [phone] utilized throughout    Follow Up Recommendations  Outpatient PT     Equipment Recommendations  None recommended by PT    Recommendations for Other Services       Precautions / Restrictions Precautions Precautions: Fall Precaution Comments: right foot drop, left wrist splint at night Restrictions Weight Bearing Restrictions: No    Mobility  Bed Mobility Overal bed mobility: Modified Independent                Transfers Overall transfer level: Modified independent   Transfers: Sit to/from Stand           General transfer comment: pt able to stand safely from bed and to chair without assist  Ambulation/Gait Ambulation/Gait assistance: Min guard Gait Distance (Feet): 450 Feet Assistive device: None Gait Pattern/deviations: Step-through pattern;Decreased stride length;Steppage;Decreased dorsiflexion - right   Gait velocity interpretation: >2.62 ft/sec, indicative of community ambulatory General Gait Details: Slow, mildly unsteady steppage like gait with foot drop on right; mild instability but no overt LOB.   Stairs Stairs:  Yes Stairs assistance: Modified independent (Device/Increase time) Stair Management: Step to pattern;Forwards;One rail Right Number of Stairs: 11 General stair comments: pt ascending and descending with RLE despite cues stating she has pain in left knee and this sequence is more comfortable for her   Wheelchair Mobility    Modified Rankin (Stroke Patients Only) Modified Rankin (Stroke Patients Only) Pre-Morbid Rankin Score: Slight disability Modified Rankin: Moderately severe disability     Balance Overall balance assessment: Needs assistance   Sitting balance-Leahy Scale: Good       Standing balance-Leahy Scale: Good                              Cognition Arousal/Alertness: Awake/alert Behavior During Therapy: WFL for tasks assessed/performed Overall Cognitive Status: Within Functional Limits for tasks assessed                                 General Comments: appears WFL but difficult to assess due to use of interpreter      Exercises      General Comments        Pertinent Vitals/Pain Pain Score: 4  Pain Location: right foot with walking Pain Descriptors / Indicators: Aching;Sore Pain Intervention(s): Limited activity within patient's tolerance;Monitored during session;Repositioned    Home Living                      Prior Function            PT Goals (current  goals can now be found in the care plan section) Progress towards PT goals: Progressing toward goals    Frequency    Min 3X/week      PT Plan Frequency needs to be updated;Current plan remains appropriate    Co-evaluation              AM-PAC PT "6 Clicks" Mobility   Outcome Measure  Help needed turning from your back to your side while in a flat bed without using bedrails?: None Help needed moving from lying on your back to sitting on the side of a flat bed without using bedrails?: None Help needed moving to and from a bed to a chair (including  a wheelchair)?: A Little Help needed standing up from a chair using your arms (e.g., wheelchair or bedside chair)?: A Little Help needed to walk in hospital room?: A Little Help needed climbing 3-5 steps with a railing? : A Little 6 Click Score: 20    End of Session Equipment Utilized During Treatment: Gait belt Activity Tolerance: Patient tolerated treatment well Patient left: in chair;with call bell/phone within reach Nurse Communication: Mobility status PT Visit Diagnosis: Muscle weakness (generalized) (M62.81);Difficulty in walking, not elsewhere classified (R26.2);Pain     Time: 1040-1058 PT Time Calculation (min) (ACUTE ONLY): 18 min  Charges:  $Gait Training: 8-22 mins                     Bayard Males, PT Acute Rehabilitation Services Pager: [phone] Office: [place] 08/27/2019, 12:19 PM

## 2019-08-27 NOTE — Discharge Instructions (Signed)
Deje de tomar su Plavix el 09/06/19

## 2019-08-27 NOTE — Progress Notes (Addendum)
  Progress Note    08/27/2019 7:42 AM 4 Days Post-Op  Subjective:  R foot pain about the same   Vitals:   08/27/19 0300 08/27/19 0436  BP:  (!) 151/76  Pulse:  73  Resp: (!) 21 20  Temp:  98.7 F (37.1 C)  SpO2:  100%   Physical Exam: Lungs:  Non labored Incisions:  L brachial cath site without hematoma Extremities:  Symmetrical radial pulses; L ATA brisk by doppler; monophasic and soft R ATA by doppler Neurologic: weakness L arm and grip strength deficit L hand  CBC    Component Value Date/Time   WBC 6.4 08/23/2019 1816   RBC 4.58 08/23/2019 1816   HGB 14.4 08/23/2019 1816   HCT 43.7 08/23/2019 1816   PLT 156 08/23/2019 1816   MCV 95.4 08/23/2019 1816   MCH 31.4 08/23/2019 1816   MCHC 33.0 08/23/2019 1816   RDW 12.0 08/23/2019 1816   LYMPHSABS 1.9 11/05/2013 1133   MONOABS 0.7 11/05/2013 1133   EOSABS 0.1 11/05/2013 1133   BASOSABS 0.0 11/05/2013 1133    BMET    Component Value Date/Time   NA 139 08/23/2019 1816   K 4.1 08/23/2019 1816   CL 107 08/23/2019 1816   CO2 23 08/23/2019 1816   GLUCOSE 97 08/23/2019 1816   BUN 7 (L) 08/23/2019 1816   CREATININE 0.76 08/23/2019 1816   CALCIUM 9.5 08/23/2019 1816   GFRNONAA >60 08/23/2019 1816   GFRAA >60 08/23/2019 1816    INR    Component Value Date/Time   INR 1.1 08/23/2019 1423     Intake/Output Summary (Last 24 hours) at 08/27/2019 7412 Last data filed at 08/26/2019 1620 Gross per 24 hour  Intake --  Output 100 ml  Net -100 ml     Assessment/Plan:  64 y.o. female with CVA s/p BLE arteriogram from L brachial artery 4 Days Post-Op   R foot pain is stable Plan is for ax-bifem on 09/13/19 with Dr. Oneida Alar 3 in 1 ordered per OT eval Possible d/c home today   Dagoberto Ligas, PA-C [facility] [phone] 08/27/2019 7:42 AM  BP 150-170 last 24 hours Ax bifem on 9/10 will need to stop plavix on 09/06/19 continue aspirin  Ruta Hinds, MD [facility]  [place]: [phone]

## 2019-08-27 NOTE — Plan of Care (Signed)
DISCHARGE NOTE HOME Katherine Archer to be discharged home per MD order. Discussed prescriptions and follow up appointments with the patient. Prescriptions given to patient; medication list explained in detail.  Stroke education and handouts were provided to patient. Patient verbalized understanding.  Skin clean, dry and intact without evidence of skin break down, no evidence of skin tears noted. IV catheter discontinued intact. Site without signs and symptoms of complications. Dressing and pressure applied. Pt denies pain at the site currently. No complaints noted.  Patient free of lines, drains, and wounds.   An After Visit Summary (AVS) was printed and given to the patient. Patient escorted via wheelchair, and discharged home via private auto.  Stephan Minister, RN

## 2019-08-27 NOTE — Care Management Important Message (Signed)
Important Message  Patient Details  Name: Katherine Archer MRN: [id number] Date of Birth: 1955-08-21   Medicare Important Message Given:  Yes     Shelda Altes 08/27/2019, 1:58 PM

## 2019-08-28 ENCOUNTER — Telehealth (HOSPITAL_COMMUNITY): Payer: Self-pay

## 2019-08-28 ENCOUNTER — Other Ambulatory Visit (HOSPITAL_COMMUNITY): Payer: Self-pay | Admitting: Interventional Radiology

## 2019-08-28 DIAGNOSIS — R69 Illness, unspecified: Secondary | ICD-10-CM | POA: Diagnosis not present

## 2019-08-28 DIAGNOSIS — I671 Cerebral aneurysm, nonruptured: Secondary | ICD-10-CM

## 2019-08-28 NOTE — Telephone Encounter (Signed)
Called pt's daughter to schedule consult, no answer, left vm. AW

## 2019-08-29 NOTE — Discharge Summary (Signed)
  Discharge Summary  Patient ID: Katherine Archer [id number] 64 y.o. 03/12/1955  Admit date: 08/23/2019  Discharge date and time: 08/27/2019  2:30 PM   Admitting Physician: [name], MD   Discharge Physician: same  Admission Diagnoses: Stroke [facility]) [I63.9] PAD (peripheral artery disease) [facility]) [I73.9]  Discharge Diagnoses: same  Admission Condition: fair  Discharged Condition: fair  Indication for Admission: CVA following lower extremity arteriogram via left brachial artery  Hospital Course: [name] is a 64 year old female who was brought in as an outpatient for bilateral lower extremity arteriogram via left brachial artery approach for rest pain of right lower extremity.  She tolerated the procedure well however postoperatively experienced strokelike symptoms.  Code stroke was called and patient was evaluated by neurology.  Patient was diagnosed with right frontal lobe embolic infarct.  Recommendations included patient to take aspirin, Plavix, and statin.  There was no neurological intervention indicated at the time.  Based on angiography she will require right axillary artery -bifemoral bypass.  She was evaluated by PT/OT and was not recommended any outpatient follow-up.  Patient will however follow-up as an outpatient with pulmonology as well as cardiology for preoperative clearance.  She will then see Dr. Oneida Alar in the office on 09/05/2019 to further discuss treatment plan.  Ax-bifemoral bypass is scheduled for an 09/13/2019.  Plavix will be held starting 09/06/2019.  All of the above was discussed with the patient and she voices her understanding.  She will be discharged home in stable condition.  Consults: neurology  Treatments: surgery: Bilateral lower extremity arteriogram via left brachial artery by Dr. Oneida Alar on 08/23/2019  Discharge Exam: See progress note 08/27/19 Vitals:   08/27/19 0819 08/27/19 1253  BP: (!) 156/60 (!) 181/67  Pulse: 65   Resp: 18 17  Temp: 98.2 F  (36.8 C) 98 F (36.7 C)  SpO2: 99% 98%     Disposition: Discharge disposition: 01-Home or Self Care       Patient Instructions:  Allergies as of 08/27/2019      Reactions   Amoxicillin Other (See Comments)   unknown   Penicillins Other (See Comments)   unknown      Medication List    TAKE these medications   aspirin 325 MG EC tablet Take 1 tablet (325 mg total) by mouth daily.   atorvastatin 80 MG tablet Commonly known as: LIPITOR Take 1 tablet (80 mg total) by mouth daily at 6 PM.   clopidogrel 75 MG tablet Commonly known as: PLAVIX Take 1 tablet (75 mg total) by mouth daily for 9 days.   hydrALAZINE 25 MG tablet Commonly known as: APRESOLINE Take 25 mg by mouth 3 (three) times daily.   traMADol 50 MG tablet Commonly known as: ULTRAM Take 50 mg by mouth every 6 (six) hours as needed for moderate pain.      Activity: activity as tolerated Diet: regular diet Wound Care: none needed  Follow-up with Dr. Oneida Alar in 3 weeks. Follow up as scheduled with Pulmonology and Cardiology  Signed: Dagoberto Ligas, PA-C 08/29/2019 9:02 AM [facility] Office: [phone]

## 2019-08-30 DIAGNOSIS — I10 Essential (primary) hypertension: Secondary | ICD-10-CM | POA: Diagnosis not present

## 2019-08-30 DIAGNOSIS — I739 Peripheral vascular disease, unspecified: Secondary | ICD-10-CM | POA: Diagnosis not present

## 2019-08-30 DIAGNOSIS — Z7689 Persons encountering health services in other specified circumstances: Secondary | ICD-10-CM | POA: Diagnosis not present

## 2019-08-30 DIAGNOSIS — I69354 Hemiplegia and hemiparesis following cerebral infarction affecting left non-dominant side: Secondary | ICD-10-CM | POA: Diagnosis not present

## 2019-09-02 ENCOUNTER — Ambulatory Visit (INDEPENDENT_AMBULATORY_CARE_PROVIDER_SITE_OTHER): Payer: Medicare HMO | Admitting: Cardiology

## 2019-09-02 ENCOUNTER — Encounter: Payer: Self-pay | Admitting: Cardiology

## 2019-09-02 ENCOUNTER — Other Ambulatory Visit: Payer: Self-pay

## 2019-09-02 VITALS — BP 138/52 | HR 100 | Ht 66.0 in | Wt 123.2 lb

## 2019-09-02 DIAGNOSIS — I739 Peripheral vascular disease, unspecified: Secondary | ICD-10-CM | POA: Diagnosis not present

## 2019-09-02 DIAGNOSIS — Z01810 Encounter for preprocedural cardiovascular examination: Secondary | ICD-10-CM

## 2019-09-02 DIAGNOSIS — E782 Mixed hyperlipidemia: Secondary | ICD-10-CM

## 2019-09-02 DIAGNOSIS — Z720 Tobacco use: Secondary | ICD-10-CM

## 2019-09-02 DIAGNOSIS — R9431 Abnormal electrocardiogram [ECG] [EKG]: Secondary | ICD-10-CM

## 2019-09-02 DIAGNOSIS — Z8673 Personal history of transient ischemic attack (TIA), and cerebral infarction without residual deficits: Secondary | ICD-10-CM | POA: Diagnosis not present

## 2019-09-02 MED ORDER — METOPROLOL TARTRATE 100 MG PO TABS: 100.0000 mg | ORAL_TABLET | Freq: Once | ORAL | 0 refills | Status: DC

## 2019-09-02 NOTE — Progress Notes (Signed)
Cardiology Office Note:    Date:  09/02/2019   ID:  Katherine Archer, DOB Jan 29, 1955, MRN [id number]  PCP:  [name], MD  Cardiologist:  No primary care provider on file.  Electrophysiologist:  None   Referring MD: [name], MD   Establish care  History of Present Illness:    Katherine Archer is a 64 y.o. 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  She also has hyperlipidemia and is a current smoker.  The patient is here today as she was referred by vascular.   The patient tells me that she is not really active therefore not doing moderate activity disability symptoms of angina. The patient does not speak English therefore our visit was facilitated by the in person interpreter provided by [facility].   Past Medical History:  Diagnosis Date  . Hyperlipidemia     Past Surgical History:  Procedure Laterality Date  . ABDOMINAL AORTOGRAM W/LOWER EXTREMITY Bilateral 08/23/2019   Procedure: ABDOMINAL AORTOGRAM W/ Bilateral LOWER EXTREMITY [place];  Surgeon: [name], MD;  Location: [facility];  Service: Cardiovascular;  Laterality: Bilateral;  . BRAIN SURGERY    . CEREBRAL ANEURYSM REPAIR      Current Medications: Current Meds  Medication Sig  . aspirin EC 325 MG EC tablet Take 1 tablet (325 mg total) by mouth daily.  Marland Kitchen atorvastatin (LIPITOR) 80 MG tablet Take 1 tablet (80 mg total) by mouth daily at 6 PM.  . clopidogrel (PLAVIX) 75 MG tablet Take 1 tablet (75 mg total) by mouth daily for 9 days.  . hydrALAZINE (APRESOLINE) 25 MG tablet Take 25 mg by mouth 3 (three) times daily.  . hydrochlorothiazide (HYDRODIURIL) 25 MG tablet Take 25 mg by mouth daily.  Marland Kitchen ibuprofen (ADVIL) 200 MG tablet Take 200 mg by mouth every 6 (six) hours as needed.     Allergies:   Amoxicillin and  Penicillins   Social History   Socioeconomic History  . Marital status: Divorced    Spouse name: Not on file  . Number of children: Not on file  . Years of education: Not on file  . Highest education level: Not on file  Occupational History  . Not on file  Tobacco Use  . Smoking status: Current Every Day Smoker    Packs/day: 0.25    Types: Cigarettes  . Smokeless tobacco: Never Used  Vaping Use  . Vaping Use: Never used  Substance and Sexual Activity  . Alcohol use: No  . Drug use: No  . Sexual activity: Not on file  Other Topics Concern  . Not on file  Social History Narrative  . Not on file   Social Determinants of Health   Financial Resource Strain:   . Difficulty of Paying Living Expenses: Not on file  Food Insecurity:   . Worried About Charity fundraiser in the Last Year: Not on file  . Ran Out of Food in the Last Year: Not on file  Transportation Needs:   . Lack of Transportation (Medical): Not on file  . Lack of Transportation (Non-Medical): Not on file  Physical Activity:   . Days of Exercise per Week: Not on file  . Minutes of Exercise per Session: Not on file  Stress:   . Feeling of Stress : Not  on file  Social Connections:   . Frequency of Communication with Friends and Family: Not on file  . Frequency of Social Gatherings with Friends and Family: Not on file  . Attends Religious Services: Not on file  . Active Member of Clubs or Organizations: Not on file  . Attends Archivist Meetings: Not on file  . Marital Status: Not on file     Family History: The patient's family history includes Heart Problems in her brother, brother, and father.  ROS:   Review of Systems  Constitution: Negative for decreased appetite, fever and weight gain.  HENT: Negative for congestion, ear discharge, hoarse voice and sore throat.   Eyes: Negative for discharge, redness, vision loss in right eye and visual halos.  Cardiovascular: Negative for chest pain,  dyspnea on exertion, leg swelling, orthopnea and palpitations.  Respiratory: Negative for cough, hemoptysis, shortness of breath and snoring.   Endocrine: Negative for heat intolerance and polyphagia.  Hematologic/Lymphatic: Negative for bleeding problem. Does not bruise/bleed easily.  Skin: Negative for flushing, nail changes, rash and suspicious lesions.  Musculoskeletal: Negative for arthritis, joint pain, muscle cramps, myalgias, neck pain and stiffness.  Gastrointestinal: Negative for abdominal pain, bowel incontinence, diarrhea and excessive appetite.  Genitourinary: Negative for decreased libido, genital sores and incomplete emptying.  Neurological: Negative for brief paralysis, focal weakness, headaches and loss of balance.  Psychiatric/Behavioral: Negative for altered mental status, depression and suicidal ideas.  Allergic/Immunologic: Negative for HIV exposure and persistent infections.    EKGs/Labs/Other Studies Reviewed:    The following studies were reviewed today:   EKG:  The ekg ordered today demonstrates sinus rhythm, heart rate 100 bpm, P wave suggestive of biatrial enlargement with 0.5 mm ST depressions in the anterolateral leads.  And prolonged QTC 528 ms.  ComparedTo prior EKG done on August 2021 significant T wave inversion in the anterolateral leads does not exist any longer.  The echocardiogram IMPRESSIONS August 24, 2019  1. Left ventricular ejection fraction, by estimation, is 70 to 75%. The  left ventricle has hyperdynamic function. The left ventricle has no  regional wall motion abnormalities. There is moderate concentric left  ventricular hypertrophy. Left ventricular  diastolic parameters are consistent with Grade I diastolic dysfunction  (impaired relaxation). Elevated left atrial pressure.  2. Right ventricular systolic function is normal. The right ventricular  size is normal. There is moderately elevated pulmonary artery systolic  pressure.  3. The  mitral valve is normal in structure. Mild mitral valve  regurgitation. No evidence of mitral stenosis.  4. The aortic valve is normal in structure. Aortic valve regurgitation is  not visualized. No aortic stenosis is present.  5. The inferior vena cava is dilated in size with <50% respiratory  variability, suggesting right atrial pressure of 15 mmHg.    CT neck IMPRESSION: 1. Positive for Bilateral Vertebral Artery Occlusion. Suspect the Left Vertebral is dominant and there is fresh thrombus extending from the left vertebral origin into the lumen of the left subclavian artery.  2. Both vertebral arteries are suboptimally reconstituted distally, especially on the right. Furthermore, a highly diminutive appearance of the distal right vertebral artery and the basilar artery raising the possibility that the right vertebral occlusion might be chronic.  3. No large vessel occlusion identified in the anterior circulation. 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  4. Extensive atherosclerosis, including the aortic arch  and great vessels. 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  5. Moderate stenoses of the Left PCA P2, Right ACA A2, supraclinoid Right ICA.  Aortic Atherosclerosis (ICD10-I70.0).   CT head without contrastIMPRESSION: 1. Small recent cortical infarct at the the high right frontal lobe. 2. Prior high left perirolandic infarct. 3. Aneurysm clipping along the left sided circle-of-Willis.  Recent Labs: 08/23/2019: ALT 15; BUN 7; Creatinine, Ser 0.76; Hemoglobin 14.4; Platelets 156; Potassium 4.1; Sodium 139  Recent Lipid Panel    Component Value Date/Time   CHOL 178 08/24/2019 0142   TRIG 65 08/24/2019 0142   HDL 38 (L) 08/24/2019 0142   CHOLHDL 4.7 08/24/2019 0142   VLDL 13 08/24/2019 0142   LDLCALC 127 (H) 08/24/2019 0142     Physical Exam:    VS:  BP (!) 138/52   Pulse 100   Ht _0  (1.676 m)   Wt 123 lb 3.2 oz (55.9 kg)   SpO2 98%   BMI 19.89 kg/m     Wt Readings from Last 3 Encounters:  09/02/19 123 lb 3.2 oz (55.9 kg)  08/23/19 129 lb (58.5 kg)  08/15/19 131 lb (59.4 kg)     GEN: Well nourished, well developed in no acute distress HEENT: Normal NECK: No JVD; No carotid bruits LYMPHATICS: No lymphadenopathy CARDIAC: S1S2 noted,RRR, no murmurs, rubs, gallops RESPIRATORY:  Clear to auscultation without rales, wheezing or rhonchi  ABDOMEN: Soft, non-tender, non-distended, +bowel sounds, no guarding. EXTREMITIES: No edema, No cyanosis, no clubbing MUSCULOSKELETAL:  No deformity  SKIN: Warm and dry NEUROLOGIC:  Alert and oriented x 3, non-focal PSYCHIATRIC:  Normal affect, good insight  ASSESSMENT:    1. PAD (peripheral artery disease) ([place])   2. History of CVA (cerebrovascular accident)   3. Abnormal EKG   4. Preoperative cardiovascular examination   5. Tobacco use   6. Mixed hyperlipidemia    PLAN:    This patient is very high risk for coronary artery disease.  At this time I like to pursue an ischemic evaluation in this patient given her previous procedure clearance as well as her abnormal EKG and risk factors.  A coronary CTA would be the most appropriate test at this time.  I discussed with the patient and educated her about this test and she is agreeable to proceed.  We were able to get the patient is scheduled for Thursday, September 2 at 4 PM.  Her blood pressure is acceptable in the office no changes will be made to this medication regimen.  Smoking cessation advised.  History of CVA continue patient on her current medication regimen which include aspirin and Plavix and Lipitor 80 mg daily.  PAD continue with current medication regimen.  The patient is in agreement with the above plan. The patient left the office in stable condition.  The patient will follow up in 1 month or  sooner if needed.   Medication Adjustments/Labs and Tests Ordered: Current medicines are reviewed at length with the patient today.  Concerns regarding medicines are outlined above.  Orders Placed This Encounter  Procedures  . CT CORONARY MORPH W/CTA COR W/SCORE W/CA W/CM &/OR WO/CM  . CT CORONARY FRACTIONAL FLOW RESERVE DATA PREP  . CT CORONARY FRACTIONAL FLOW RESERVE FLUID ANALYSIS  . Basic Metabolic Panel (BMET)  . EKG 12-Lead   Meds ordered this encounter  Medications  . metoprolol tartrate (LOPRESSOR) 100 MG tablet    Sig: Take 1 tablet (100 mg total) by mouth  once for 1 dose. Take 2 hours prior to  procedure    Dispense:  1 tablet    Refill:  0    Patient Instructions  Medication Instructions:   Your physician recommends that you continue on your current medications as directed. Please refer to the Current Medication list given to you today.   *If you need a refill on your cardiac medications before your next appointment, please call your pharmacy*   Lab Work: RETURN FOR BMET  4-5 DAYS BEFORE PROCEDURE   If you have labs (blood work) drawn today and your tests are completely normal, you will receive your results only by: Marland Kitchen MyChart Message (if you have MyChart) OR . A paper copy in the mail If you have any lab test that is abnormal or we need to change your treatment, we will call you to review the results.   Testing/Procedures: NONE ORDERED  TODAY   Follow-Up: At [facility], you and your health needs are our priority.  As part of our continuing mission to provide you with exceptional heart care, we have created designated Provider Care Teams.  These Care Teams include your primary Cardiologist (physician) and Advanced Practice Providers (APPs -  Physician Assistants and Nurse Practitioners) who all work together to provide you with the care you need, when you need it.  We recommend signing up for the patient portal called "MyChart".  Sign up information is  provided on this After Visit Summary.  MyChart is used to connect with patients for Virtual Visits (Telemedicine).  Patients are able to view lab/test results, encounter notes, upcoming appointments, etc.  Non-urgent messages can be sent to your provider as well.   To learn more about what you can do with MyChart, go to [email or web address].    Your next appointment:   1 month(s)  The format for your next appointment:   In Person   Your cardiac CT will be scheduled at one of the below locations:   [facility] [address] [place], [place] [address] [phone]  [place] [address] [place], [place] [address] [phone]  If scheduled at [facility], please arrive at the [facility] main entrance of [facility] 30 minutes prior to test start time. Proceed to the [facility] Radiology Department (first floor) to check-in and test prep.  If scheduled at [facility], please arrive 15 mins early for check-in and test prep.  Please follow these instructions carefully (unless otherwise directed):   On the Night Before the Test: . Be sure to Drink plenty of water. . Do not consume any caffeinated/decaffeinated beverages or chocolate 12 hours prior to your test. . Do not take any antihistamines 12 hours prior to your test.  On the Day of the Test: . Drink plenty of water. Do not drink any water within one hour of the test. . Do not eat any food 4 hours prior to the test. . You may take your regular medications prior to the test.  . Take metoprolol (Lopressor) two hours prior to test. . HOLD Furosemide/Hydrochlorothiazide morning of the test. . FEMALES- please wear underwire-free bra if available   *For Clinical Staff only. Please instruct patient the following:*        -Drink plenty of water       -Hold Furosemide/hydrochlorothiazide morning of the test        -Take metoprolol (Lopressor) 2 hours prior to test (  if applicable).                  -If HR is less than 55 BPM- No Lopressor                -IF HR is greater than 55 BPM and patient is less than or equal to 15 yrs old Lopressor 167m x1.                -If HR is greater than 55 BPM and patient is greater than 737yrs old Lopressor 50 mg x1.     Do not give Lopressor to patients with an allergy to lopressor or anyone with asthma or active COPD symptoms (currently taking steroids).       After the Test: . Drink plenty of water. . After receiving IV contrast, you may experience a mild flushed feeling. This is normal. . On occasion, you may experience a mild rash up to 24 hours after the test. This is not dangerous. If this occurs, you can take Benadryl 25 mg and increase your fluid intake. . If you experience trouble breathing, this can be serious. If it is severe call 911 IMMEDIATELY. If it is mild, please call our office. . If you take any of these medications: Glipizide/Metformin, Avandament, Glucavance, please do not take 48 hours after completing test unless otherwise instructed.   Once we have confirmed authorization from your insurance company, we will call you to set up a date and time for your test. Based on how quickly your insurance processes prior authorizations requests, please allow up to 4 weeks to be contacted for scheduling your Cardiac CT appointment. Be advised that routine Cardiac CT appointments could be scheduled as many as 8 weeks after your provider has ordered it.  For non-scheduling related questions, please contact the cardiac imaging nurse navigator should you have any questions/concerns: SMarchia Bond Cardiac Imaging Nurse Navigator MBurley Saver Interim Cardiac Imaging Nurse [facility] Direct Office Dial: [phone]  For scheduling needs, including cancellations and rescheduling, please call TVivien Rotaat [phone] option 3.      Provider:   You will see Dr. THarriet Masson  Or, you can be scheduled with the following Advanced Practice Provider on your designated Care Team ([facility]:  CLaurann Montana FNP     Other Instructions      Adopting a Healthy Lifestyle.  Know what a healthy weight is for you (roughly BMI <25) and aim to maintain this   Aim for 7+ servings of fruits and vegetables daily   65-80+ fluid ounces of water or unsweet tea for healthy kidneys   Limit to max 1 drink of alcohol per day; avoid smoking/tobacco   Limit animal fats in diet for cholesterol and heart health - choose grass fed whenever available   Avoid highly processed foods, and foods high in saturated/trans fats   Aim for low stress - take time to unwind and care for your mental health   Aim for 150 min of moderate intensity exercise weekly for heart health, and weights twice weekly for bone health   Aim for 7-9 hours of sleep daily   When it comes to diets, agreement about the perfect plan isnt easy to find, even among the experts. Experts at the [employer] an idea known as the Healthy Eating Plate. Just imagine a plate divided into logical, healthy portions.   The emphasis is on diet  quality:   Load up on vegetables and fruits - one-half of your plate: Aim for color and variety, and remember that potatoes dont count.   Go for whole grains - one-quarter of your plate: Whole wheat, barley, wheat berries, quinoa, oats, brown rice, and foods made with them. If you want pasta, go with whole wheat pasta.   Protein power - one-quarter of your plate: Fish, chicken, beans, and nuts are all healthy, versatile protein sources. Limit red meat.   The diet, however, does go beyond the plate, offering a few other suggestions.   Use healthy plant oils, such as olive, canola, soy, corn, sunflower and peanut. Check the labels, and avoid partially hydrogenated oil, which have unhealthy  trans fats.   If youre thirsty, drink water. Coffee and tea are good in moderation, but skip sugary drinks and limit milk and dairy products to one or two daily servings.   The type of carbohydrate in the diet is more important than the amount. Some sources of carbohydrates, such as vegetables, fruits, whole grains, and beans-are healthier than others.   Finally, stay active  Signed, Berniece Salines, DO  09/02/2019 3:27 PM    [facility]

## 2019-09-02 NOTE — Patient Instructions (Signed)
Medication Instructions:   Your physician recommends that you continue on your current medications as directed. Please refer to the Current Medication list given to you today.   *If you need a refill on your cardiac medications before your next appointment, please call your pharmacy*   Lab Work: RETURN FOR BMET  4-5 DAYS BEFORE PROCEDURE   If you have labs (blood work) drawn today and your tests are completely normal, you will receive your results only by: Marland Kitchen MyChart Message (if you have MyChart) OR . A paper copy in the mail If you have any lab test that is abnormal or we need to change your treatment, we will call you to review the results.   Testing/Procedures: NONE ORDERED  TODAY   Follow-Up: At [facility], you and your health needs are our priority.  As part of our continuing mission to provide you with exceptional heart care, we have created designated Provider Care Teams.  These Care Teams include your primary Cardiologist (physician) and Advanced Practice Providers (APPs -  Physician Assistants and Nurse Practitioners) who all work together to provide you with the care you need, when you need it.  We recommend signing up for the patient portal called "MyChart".  Sign up information is provided on this After Visit Summary.  MyChart is used to connect with patients for Virtual Visits (Telemedicine).  Patients are able to view lab/test results, encounter notes, upcoming appointments, etc.  Non-urgent messages can be sent to your provider as well.   To learn more about what you can do with MyChart, go to [email or web address].    Your next appointment:   1 month(s)  The format for your next appointment:   In Person   Your cardiac CT will be scheduled at one of the below locations:   [facility] [address] [place], [place] [address] [phone]  [place] [address] [place], [place]  [address] [phone]  If scheduled at [facility], please arrive at the [facility] main entrance of [facility] 30 minutes prior to test start time. Proceed to the [facility] Radiology Department (first floor) to check-in and test prep.  If scheduled at [facility], please arrive 15 mins early for check-in and test prep.  Please follow these instructions carefully (unless otherwise directed):   On the Night Before the Test: . Be sure to Drink plenty of water. . Do not consume any caffeinated/decaffeinated beverages or chocolate 12 hours prior to your test. . Do not take any antihistamines 12 hours prior to your test.  On the Day of the Test: . Drink plenty of water. Do not drink any water within one hour of the test. . Do not eat any food 4 hours prior to the test. . You may take your regular medications prior to the test.  . Take metoprolol (Lopressor) two hours prior to test. . HOLD Furosemide/Hydrochlorothiazide morning of the test. . FEMALES- please wear underwire-free bra if available   *For Clinical Staff only. Please instruct patient the following:*        -Drink plenty of water       -Hold Furosemide/hydrochlorothiazide morning of the test       -Take metoprolol (Lopressor) 2 hours prior to test (if applicable).                  -If HR is less than 55 BPM- No Lopressor                -  IF HR is greater than 55 BPM and patient is less than or equal to 68 yrs old Lopressor 153m x1.                -If HR is greater than 55 BPM and patient is greater than 718yrs old Lopressor 50 mg x1.     Do not give Lopressor to patients with an allergy to lopressor or anyone with asthma or active COPD symptoms (currently taking steroids).       After the Test: . Drink plenty of water. . After receiving IV contrast, you may experience a mild flushed feeling. This is normal. . On occasion, you may experience a mild rash up to 24 hours after the  test. This is not dangerous. If this occurs, you can take Benadryl 25 mg and increase your fluid intake. . If you experience trouble breathing, this can be serious. If it is severe call 911 IMMEDIATELY. If it is mild, please call our office. . If you take any of these medications: Glipizide/Metformin, Avandament, Glucavance, please do not take 48 hours after completing test unless otherwise instructed.   Once we have confirmed authorization from your insurance company, we will call you to set up a date and time for your test. Based on how quickly your insurance processes prior authorizations requests, please allow up to 4 weeks to be contacted for scheduling your Cardiac CT appointment. Be advised that routine Cardiac CT appointments could be scheduled as many as 8 weeks after your provider has ordered it.  For non-scheduling related questions, please contact the cardiac imaging nurse navigator should you have any questions/concerns: SMarchia Bond Cardiac Imaging Nurse Navigator MBurley Saver Interim Cardiac Imaging Nurse [facility] [facility] Direct Office Dial: [phone]  For scheduling needs, including cancellations and rescheduling, please call TVivien Rotaat [phone] option 3.     Provider:   You will see Dr. THarriet Masson  Or, you can be scheduled with the following Advanced Practice Provider on your designated Care Team [facility]:  CLaurann Montana FNP     Other Instructions

## 2019-09-03 ENCOUNTER — Telehealth: Payer: Self-pay

## 2019-09-03 LAB — BASIC METABOLIC PANEL
BUN/Creatinine Ratio: 19 (ref 12–28)
BUN: 14 mg/dL (ref 8–27)
CO2: 23 mmol/L (ref 20–29)
Calcium: 10.2 mg/dL (ref 8.7–10.3)
Chloride: 97 mmol/L (ref 96–106)
Creatinine, Ser: 0.75 mg/dL (ref 0.57–1.00)
GFR calc Af Amer: 99 mL/min/{1.73_m2} (ref >59)
GFR calc non Af Amer: 86 mL/min/{1.73_m2} (ref >59)
Glucose: 98 mg/dL (ref 65–99)
Potassium: 4.1 mmol/L (ref 3.5–5.2)
Sodium: 136 mmol/L (ref 134–144)

## 2019-09-03 NOTE — Telephone Encounter (Signed)
-----   Message from Berniece Salines, DO sent at 09/03/2019  9:05 AM EDT ----- Labs normal

## 2019-09-03 NOTE — Telephone Encounter (Signed)
Spoke with patient regarding results and recommendation.  Patient verbalizes understanding and is agreeable to plan of care. Advised patient to call back with any issues or concerns.   Interpreter used with ID number [id number]

## 2019-09-04 ENCOUNTER — Other Ambulatory Visit: Payer: Self-pay

## 2019-09-05 ENCOUNTER — Encounter: Payer: Self-pay | Admitting: Vascular Surgery

## 2019-09-05 ENCOUNTER — Other Ambulatory Visit: Payer: Self-pay

## 2019-09-05 ENCOUNTER — Ambulatory Visit (INDEPENDENT_AMBULATORY_CARE_PROVIDER_SITE_OTHER): Payer: Medicare HMO | Admitting: Vascular Surgery

## 2019-09-05 VITALS — BP 132/73 | HR 88 | Temp 98.0°F | Resp 20 | Ht 66.0 in | Wt 120.0 lb

## 2019-09-05 DIAGNOSIS — I739 Peripheral vascular disease, unspecified: Secondary | ICD-10-CM | POA: Diagnosis not present

## 2019-09-05 NOTE — Progress Notes (Addendum)
Patient is a 64 year old female who returns for follow-up today.  She recent underwent diagnostic arteriogram for planning for peripheral arterial procedure.  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  She still has some mild contracture of the left hand but this is recovering somewhat.  She is continuing physical therapy.  She feels she is ready for axillary bifemoral bypass grafting at this point.  She has rest pain in the right foot.  She currently does not have any open wounds.  She states the left foot is also getting worse.  She was recently evaluated by her pulmonary doctor in [place] and found to be a reasonable candidate for operation.  Her PFTs were also reasonable.  She is scheduled apparently for a coronary CT on September 8.  She does not currently have chest pain.  Current Outpatient Medications on File Prior to Visit  Medication Sig Dispense Refill  . aspirin EC 325 MG EC tablet Take 1 tablet (325 mg total) by mouth daily. 30 tablet 0  . atorvastatin (LIPITOR) 80 MG tablet Take 1 tablet (80 mg total) by mouth daily at 6 PM. 30 tablet 2  . clopidogrel (PLAVIX) 75 MG tablet Take 1 tablet (75 mg total) by mouth daily for 9 days. 9 tablet 0  . hydrALAZINE (APRESOLINE) 25 MG tablet Take 25 mg by mouth 3 (three) times daily.    . hydrochlorothiazide (HYDRODIURIL) 25 MG tablet Take 25 mg by mouth daily.    Marland Kitchen ibuprofen (ADVIL) 200 MG tablet Take 200 mg by mouth every 6 (six) hours as needed.    . metoprolol tartrate (LOPRESSOR) 100 MG tablet Take 1 tablet (100 mg total) by mouth once for 1 dose. Take 2 hours prior to  procedure 1 tablet 0   No current facility-administered medications on file prior to visit.     Past Medical History:  Diagnosis Date  . Hyperlipidemia   . Hypertension     Past Surgical History:  Procedure Laterality Date  . ABDOMINAL AORTOGRAM W/LOWER EXTREMITY Bilateral 08/23/2019   Procedure: ABDOMINAL AORTOGRAM W/  Bilateral LOWER EXTREMITY [place];  Surgeon: [name], MD;  Location: [facility];  Service: Cardiovascular;  Laterality: Bilateral;  . BRAIN SURGERY    . CEREBRAL ANEURYSM REPAIR      Review of systems: She has no chest pain currently.  She gets short of breath with exertion.  She has not had any new neurologic events since her recent stroke.  Physical exam:  Vitals:   09/05/19 1025  BP: 132/73  Pulse: 88  Resp: 20  Temp: 98 F (36.7 C)  SpO2: 96%  Weight: 120 lb (54.4 kg)  Height: 5\' 6"  (1.676 m)    Extremities: 2+ brachial radial pulse bilaterally, absent femoral pedal pulses Skin: No open wounds or ulcer Neuro: Some flexion deformity of the left hand although she is able to open this some.  4/5 motor arm flexion and shoulder extension on the left side compared to her right which is 5/5, minimal left leg weakness  Data: I reviewed the patient's prior ABIs from August 15, 2019.  Right side was 0.2 left side 0.4  Assessment: Rest pain right foot early rest pain left foot at risk for limb loss without revascularization.  Despite having recent stroke I believe she needs an intervention sooner rather than later to prevent her from limb loss or worsening symptoms in her right leg.  I discussed with the patient  and her daughter today risk benefits possible complications of axillary bifemoral bypass grafting.  These include but are not limited to myocardial events 5% bleeding 1 to 2% infection 1% worsening of stroke 2 to 5%  Plan: Right axillary bifemoral bypass with bilateral common femoral endarterectomy scheduled for September 13, 2019.  We will stop her Plavix today.  She will continue her aspirin perioperatively.  We will resume her Plavix postop.  Ruta Hinds, MD [facility] Office: [phone]     The patient speaks only Spanish.  Her daughter translated today during the office visit.

## 2019-09-06 DIAGNOSIS — I10 Essential (primary) hypertension: Secondary | ICD-10-CM | POA: Diagnosis not present

## 2019-09-06 DIAGNOSIS — Z681 Body mass index (BMI) 19 or less, adult: Secondary | ICD-10-CM | POA: Diagnosis not present

## 2019-09-11 ENCOUNTER — Other Ambulatory Visit: Payer: Self-pay

## 2019-09-11 ENCOUNTER — Encounter (HOSPITAL_COMMUNITY)
Admission: RE | Admit: 2019-09-11 | Discharge: 2019-09-11 | Disposition: A | Discharge status: Home / Self Care | Payer: Medicare HMO | Source: Ambulatory Visit | Attending: Vascular Surgery | Admitting: Vascular Surgery

## 2019-09-11 ENCOUNTER — Ambulatory Visit (HOSPITAL_COMMUNITY)
Admission: RE | Admit: 2019-09-11 | Discharge: 2019-09-11 | Disposition: A | Discharge status: Home / Self Care | Payer: Medicare HMO | Source: Ambulatory Visit | Attending: Interventional Radiology | Admitting: Interventional Radiology

## 2019-09-11 ENCOUNTER — Encounter (HOSPITAL_COMMUNITY): Payer: Self-pay | Admitting: Anesthesiology

## 2019-09-11 ENCOUNTER — Encounter (HOSPITAL_COMMUNITY): Payer: Self-pay | Admitting: Vascular Surgery

## 2019-09-11 ENCOUNTER — Encounter (HOSPITAL_COMMUNITY): Payer: Self-pay

## 2019-09-11 DIAGNOSIS — F17210 Nicotine dependence, cigarettes, uncomplicated: Secondary | ICD-10-CM | POA: Diagnosis not present

## 2019-09-11 DIAGNOSIS — Z7982 Long term (current) use of aspirin: Secondary | ICD-10-CM | POA: Insufficient documentation

## 2019-09-11 DIAGNOSIS — E785 Hyperlipidemia, unspecified: Secondary | ICD-10-CM | POA: Insufficient documentation

## 2019-09-11 DIAGNOSIS — Z79899 Other long term (current) drug therapy: Secondary | ICD-10-CM | POA: Insufficient documentation

## 2019-09-11 DIAGNOSIS — I639 Cerebral infarction, unspecified: Secondary | ICD-10-CM | POA: Diagnosis not present

## 2019-09-11 DIAGNOSIS — I10 Essential (primary) hypertension: Secondary | ICD-10-CM | POA: Diagnosis not present

## 2019-09-11 DIAGNOSIS — Z8673 Personal history of transient ischemic attack (TIA), and cerebral infarction without residual deficits: Secondary | ICD-10-CM | POA: Insufficient documentation

## 2019-09-11 DIAGNOSIS — I671 Cerebral aneurysm, nonruptured: Secondary | ICD-10-CM | POA: Insufficient documentation

## 2019-09-11 DIAGNOSIS — R69 Illness, unspecified: Secondary | ICD-10-CM | POA: Diagnosis not present

## 2019-09-11 LAB — COMPREHENSIVE METABOLIC PANEL
ALT: 25 U/L (ref 0–44)
AST: 24 U/L (ref 15–41)
Albumin: 4 g/dL (ref 3.5–5.0)
Alkaline Phosphatase: 85 U/L (ref 38–126)
Anion gap: 9 (ref 5–15)
BUN: 12 mg/dL (ref 8–23)
CO2: 29 mmol/L (ref 22–32)
Calcium: 9.9 mg/dL (ref 8.9–10.3)
Chloride: 97 mmol/L — ABNORMAL LOW (ref 98–111)
Creatinine, Ser: 0.74 mg/dL (ref 0.44–1.00)
GFR calc Af Amer: >60 mL/min (ref >60)
GFR calc non Af Amer: >60 mL/min (ref >60)
Glucose, Bld: 107 mg/dL — ABNORMAL HIGH (ref 70–99)
Potassium: 3.1 mmol/L — ABNORMAL LOW (ref 3.5–5.1)
Sodium: 135 mmol/L (ref 135–145)
Total Bilirubin: 0.9 mg/dL (ref 0.3–1.2)
Total Protein: 7.5 g/dL (ref 6.5–8.1)

## 2019-09-11 LAB — PROTIME-INR
INR: 1 (ref 0.8–1.2)
Prothrombin Time: 12.8 seconds (ref 11.4–15.2)

## 2019-09-11 LAB — CBC
HCT: 40.5 % (ref 36.0–46.0)
Hemoglobin: 13.5 g/dL (ref 12.0–15.0)
MCH: 31.5 pg (ref 26.0–34.0)
MCHC: 33.3 g/dL (ref 30.0–36.0)
MCV: 94.6 fL (ref 80.0–100.0)
Platelets: 301 10*3/uL (ref 150–400)
RBC: 4.28 MIL/uL (ref 3.87–5.11)
RDW: 11.6 % (ref 11.5–15.5)
WBC: 5.9 10*3/uL (ref 4.0–10.5)
nRBC: 0 % (ref 0.0–0.2)

## 2019-09-11 LAB — URINALYSIS, ROUTINE W REFLEX MICROSCOPIC
Bilirubin Urine: NEGATIVE
Glucose, UA: NEGATIVE mg/dL
Hgb urine dipstick: NEGATIVE
Ketones, ur: NEGATIVE mg/dL
Leukocytes,Ua: NEGATIVE
Nitrite: NEGATIVE
Protein, ur: NEGATIVE mg/dL
Specific Gravity, Urine: 1.011 (ref 1.005–1.030)
pH: 7 (ref 5.0–8.0)

## 2019-09-11 LAB — APTT: aPTT: 29 seconds (ref 24–36)

## 2019-09-11 LAB — TYPE AND SCREEN
ABO/RH(D): O POS
Antibody Screen: NEGATIVE

## 2019-09-11 LAB — SURGICAL PCR SCREEN
MRSA, PCR: NEGATIVE
Staphylococcus aureus: NEGATIVE

## 2019-09-11 NOTE — Progress Notes (Signed)
PCP - Dr. [name] in [place], [place] Cardiologist - Saw once d/t circulation issues, seeing again on the 30th.  Cannot remember their name.  Chest x-ray - Not indicated EKG - 09/02/19 Stress Test - Denies ECHO - 08/24/19 Cardiac Cath - Denies  Sleep Study - Denies OSA DM - Denies  Aspirin Instructions: Told  to call Dr. Oneida Alar for specific instruction on her Aspirin (Recent Stroke per patient August 2021)  COVID TEST- 09/12/19  Anesthesia review: Yes recent stroke. Has HTN and having issues getting B/P med refilled, instructed her to call her PCP.   Patient denies shortness of breath, fever, cough and chest pain at PAT appointment   All instructions explained to the patient, with a verbal understanding of the material. Patient agrees to go over the instructions while at home for a better understanding. Patient also instructed to self quarantine after being tested for COVID-19. The opportunity to ask questions was provided.

## 2019-09-11 NOTE — Pre-Procedure Instructions (Signed)
[id number] [address], [place] [address] Phone: [phone] Fax: [phone]      Your procedure is scheduled on Friday September 10th.  Report to [facility] Main Entrance "A" at 5:30 A.M., and check in at the Admitting office.  Call this number if you have problems the morning of surgery:  [phone]  Call [phone] if you have any questions prior to your surgery date Monday-Friday 8am-4pm    Remember:  Do not eat or drink after midnight the night before your surgery   Take these medicines the morning of surgery with A SIP OF WATER   atorvastatin (LIPITOR) 80 MG tablet  hydrALAZINE (APRESOLINE) 50 MG tablet  As of today, STOP taking any Aspirin (unless otherwise instructed by your surgeon) Aleve, Naproxen, Ibuprofen, Motrin, Advil, Goody's, BC's, all herbal medications, fish oil, and all vitamins.                      Do not wear jewelry, make up, or nail polish            Do not wear lotions, powders, perfumes, or deodorant.            Do not shave 48 hours prior to surgery.              Do not bring valuables to the hospital.            [facility] is not responsible for any belongings or valuables.  Do NOT Smoke (Tobacco/Vaping) or drink Alcohol 24 hours prior to your procedure If you use a CPAP at night, you may bring all equipment for your overnight stay.   Contacts, glasses, dentures or bridgework may not be worn into surgery.      For patients admitted to the hospital, discharge time will be determined by your treatment team.   Patients discharged the day of surgery will not be allowed to drive home, and someone needs to stay with them for 24 hours.    Special instructions:   [facility]- Preparing For Surgery  Before surgery, you can play an important role. Because skin is not sterile, your skin needs to be as free of germs as possible. You can reduce the number of germs on your skin by washing  with CHG (chlorahexidine gluconate) Soap before surgery.  CHG is an antiseptic cleaner which kills germs and bonds with the skin to continue killing germs even after washing.    Oral Hygiene is also important to reduce your risk of infection.  Remember - BRUSH YOUR TEETH THE MORNING OF SURGERY WITH YOUR REGULAR TOOTHPASTE  Please do not use if you have an allergy to CHG or antibacterial soaps. If your skin becomes reddened/irritated stop using the CHG.  Do not shave (including legs and underarms) for at least 48 hours prior to first CHG shower. It is OK to shave your face.  Please follow these instructions carefully.   1. Shower the NIGHT BEFORE SURGERY and the MORNING OF SURGERY with CHG Soap.   2. If you chose to wash your hair, wash your hair first as usual with your normal shampoo.  3. After you shampoo, rinse your hair and body thoroughly to remove the shampoo.  4. Use CHG as you would any other liquid soap. You can apply CHG directly to the skin and wash gently with a scrungie or a clean washcloth.   5. Apply the CHG Soap to  your body ONLY FROM THE NECK DOWN.  Do not use on open wounds or open sores. Avoid contact with your eyes, ears, mouth and genitals (private parts). Wash Face and genitals (private parts)  with your normal soap.   6. Wash thoroughly, paying special attention to the area where your surgery will be performed.  7. Thoroughly rinse your body with warm water from the neck down.  8. DO NOT shower/wash with your normal soap after using and rinsing off the CHG Soap.  9. Pat yourself dry with a CLEAN TOWEL.  10. Wear CLEAN PAJAMAS to bed the night before surgery  11. Place CLEAN SHEETS on your bed the night of your first shower and DO NOT SLEEP WITH PETS.   Day of Surgery: Wear Clean/Comfortable clothing the morning of surgery Do not apply any deodorants/lotions.   Remember to brush your teeth WITH YOUR REGULAR TOOTHPASTE.   Please read over the following fact  sheets that you were given.

## 2019-09-11 NOTE — Consult Note (Addendum)
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  Referring Physician(s): Dr. Erlinda Hong stroke neurology  History of Present Illness: Katherine Archer is a 64 y.o. 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  INR service consulted on the patient while sitting patient on 08/26/2019.  The patient returns today accompanied by her daughter.  Also present was an interpreter.  Clinically the patient is continuing with her physical therapy with marginal improvement of her left upper extremity distal weakness, and also her left lower leg weakness  The patient is ambulatory with a cane for about 20 yards before she becomes symptomatic due to ischemic pain in both lower extremities.  According to the daughter the patient is unable to sleep at night because of the rest pain in both legs right greater than left.  Neuro neurologically she denies any significant severe sudden headaches nausea vomiting photophobia speech difficulties loss of consciousness or episodes of disorientation..  She does complain concomitant paresthesias in the right lower extremity which is relatively new..  More specifically she denies any chest pain shortness of breath or for productive cough.  She denies any wheezing.  She denies any recent generalized myalgias, or generalized weakness.  She denies any recent chills fever or rigors.   Past Medical History:  Diagnosis Date  . Hyperlipidemia   . Hypertension   . Stroke [facility]) 08/23/2019    Past Surgical History:  Procedure Laterality Date  . ABDOMINAL AORTOGRAM W/LOWER EXTREMITY Bilateral 08/23/2019   Procedure: ABDOMINAL AORTOGRAM W/ Bilateral LOWER EXTREMITY Runoff;   Surgeon: [name], MD;  Location: [facility];  Service: Cardiovascular;  Laterality: Bilateral;  . BRAIN SURGERY    . BREAST SURGERY Bilateral 2014   1980 R breast surgery  . CEREBRAL ANEURYSM REPAIR    . TUBAL LIGATION  1990    Allergies: Amoxicillin and Penicillins  Medications: Prior to Admission medications   Medication Sig Start Date End Date Taking? Authorizing Provider  acetaminophen (TYLENOL) 325 MG tablet Take 650 mg by mouth every 6 (six) hours as needed (For pain).    [provider]  aspirin EC 325 MG EC tablet Take 1 tablet (325 mg total) by mouth daily. 08/28/19   Dagoberto Ligas, PA-C  atorvastatin (LIPITOR) 80 MG tablet Take 1 tablet (80 mg total) by mouth daily at 6 PM. 08/27/19   Dagoberto Ligas, PA-C  hydrALAZINE (APRESOLINE) 50 MG tablet Take 25 mg by mouth 3 (three) times daily.  08/16/19   [provider]  ibuprofen (ADVIL) 200 MG tablet Take 200 mg by mouth every 6 (six) hours as needed.    [provider]     Family History  Problem Relation Age of Onset  . Heart Problems Father   . Heart Problems Brother   . Heart Problems Brother     Social History   Socioeconomic History  . Marital status: Divorced    Spouse name: Not on file  . Number of children: Not on file  . Years of education: Not on file  . Highest education level: Not on file  Occupational History  . Not on file  Tobacco Use  . Smoking status: Current Every Day Smoker    Packs/day: 0.25    Types: Cigarettes  .  Smokeless tobacco: Never Used  Vaping Use  . Vaping Use: Never used  Substance and Sexual Activity  . Alcohol use: No  . Drug use: No  . Sexual activity: Not Currently  Other Topics Concern  . Not on file  Social History Narrative  . Not on file   Social Determinants of Health   Financial Resource Strain:   . Difficulty of Paying Living Expenses: Not on file  Food Insecurity:   . Worried About Charity fundraiser in the Last  Year: Not on file  . Ran Out of Food in the Last Year: Not on file  Transportation Needs:   . Lack of Transportation (Medical): Not on file  . Lack of Transportation (Non-Medical): Not on file  Physical Activity:   . Days of Exercise per Week: Not on file  . Minutes of Exercise per Session: Not on file  Stress:   . Feeling of Stress : Not on file  Social Connections:   . Frequency of Communication with Friends and Family: Not on file  . Frequency of Social Gatherings with Friends and Family: Not on file  . Attends Religious Services: Not on file  . Active Member of Clubs or Organizations: Not on file  . Attends Archivist Meetings: Not on file  . Marital Status: Not on file     Review of Systems: A 12 point ROS discussed and pertinent positives are indicated in the HPI above.  All other systems are negative.  Review of Systems.  See above  Vital Signs: There were no vitals taken for this visit.  Physical Exam.  Patient sitting in a wheelchair in no acute distress.  Responds appropriately to conversation via interpreter and via her daughter. Neurologically.  Alert awake oriented to time place space.  Speech and comprehension normal..  Grossly no lateralizing neurologic abnormalities except for of the left-sided upper extremity pronation drift with patient having difficulty string her fingers.  Hand grip 3-4 /5.   Left lower extremity 4+/5.   Imaging: CT Code Stroke CTA Head W/WO contrast  Result Date: 08/23/2019 CLINICAL DATA:  63 year old female code stroke presentation. Left side weakness and numbness. EXAM: CT ANGIOGRAPHY HEAD AND NECK TECHNIQUE: Multidetector CT imaging of the head and neck was performed using the standard protocol during bolus administration of intravenous contrast. Multiplanar CT image reconstructions and MIPs were obtained to evaluate the vascular anatomy. Carotid stenosis measurements (when applicable) are obtained utilizing NASCET  criteria, using the distal internal carotid diameter as the denominator. CONTRAST:  70mL OMNIPAQUE IOHEXOL 350 MG/ML SOLN COMPARISON:  Plain head CT 1426 hours today. FINDINGS: CTA NECK Skeleton: Absent dentition. Degenerative changes in the cervical spine with chronic or congenital interbody fusion at C6-C7. Previous left frontotemporal craniotomy. No acute osseous abnormality identified. Upper chest: Small volume retained secretions in the trachea and at the carina. Otherwise negative. Other neck: No acute findings. 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 Right carotid system: Brachiocephalic artery and right CCA plaque without significant stenosis. Extensive soft more so than calcified plaque at the right ICA origin and bulb, the less than 50 % stenosis with respect to the distal vessel. Left carotid system: Moderate mostly soft plaque affecting the proximal left CCA, including eccentric low-density plaque on series 5, image 142, but no hemodynamically significant stenosis. Intermittent plaque to the bifurcation.  At the bifurcation only mild plaque at the left ICA origin and bulb. No left ICA stenosis to the skull base. Vertebral arteries: Plaque with 50% stenosis of the right subclavian artery origin. The right vertebral artery origin is identified on series 7, image 270 but the right vertebral is immediately occluded (image 268) and does not reconstitute until the V3 segment, where the vessel appears diminutive. Small volume of thrombus within the lumen of the left subclavian artery (series 8, image 169) related to complete occlusion of the left vertebral artery origin also. The left vertebral appears likely to be the dominant vessel, and is reconstituted beginning in the V2 segment, although with suboptimal enhancement until the  skull base, where vessel enhancement appears more robust. CTA HEAD Posterior circulation: Diminutive intracranial vertebrobasilar system, likely in part related to the bilateral vertebral thrombosis. Dominant although somewhat diminutive left vertebral artery V4 segment is patent to the vertebrobasilar junction. Diminutive right V4 and patent right PICA. Highly diminutive basilar artery (series 5, image 65). Although the basilar remains patent to the SCA is. There are fetal type bilateral PCA origins, however both posterior communicating arteries are diminutive. Bilateral PCA branches are patent. There is mild right and mild to moderate left P2 irregularity and stenosis. Anterior circulation: Both ICA siphons are patent. On the right there is moderate supraclinoid calcified plaque resulting in mild to moderate supraclinoid segment stenosis. The left siphon is patent with only mild supraclinoid calcified plaque. 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 This is located adjacent to the aneurysm clip. Both posterior communicating artery origins are patent. Patent MCA and ACA origins. Tortuous A1 segments. Anterior communicating artery and bilateral ACA branches are patent. There is a moderate stenosis of the right A2 segment on series 12, image 23. Left MCA M1 segment and bifurcation are patent without stenosis. Left MCA branches are within normal limits. Right MCA M1 segment and bifurcation are patent without stenosis. Right MCA branches are within normal limits. Venous sinuses: Patent. Anatomic variants: Fetal type bilateral PCA origins and suspected dominant left vertebral artery. Review of the MIP images confirms the above findings Preliminary results were discussed by telephone with Dr. Roland Rack at 1442 hours. IMPRESSION: 1. Positive for Bilateral Vertebral Artery  Occlusion. Suspect the Left Vertebral is dominant and there is fresh thrombus extending from the left vertebral origin into the lumen of the left subclavian artery. 2. Both vertebral arteries are suboptimally reconstituted distally, especially on the right. Furthermore, a highly diminutive appearance of the distal right vertebral artery and the basilar artery raising the possibility that the right vertebral occlusion might be chronic. 3. No large vessel occlusion identified in the anterior circulation. 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 4. Extensive atherosclerosis, including the aortic arch and great vessels. 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 5. Moderate stenoses of the Left PCA P2, Right ACA A2, supraclinoid Right ICA. Aortic Atherosclerosis (ICD10-I70.0). Electronically Signed   By: Genevie Ann M.D.   On: 08/23/2019 15:00   CT HEAD WO CONTRAST  Result Date: 08/25/2019 CLINICAL DATA:  Follow-up recent stroke EXAM: CT HEAD WITHOUT CONTRAST TECHNIQUE: Contiguous axial images were obtained from the base of the skull through the  vertex without intravenous contrast. COMPARISON:  Two days ago FINDINGS: Brain: Small recent posterior and high right frontal cortex infarct. Small remote left perirolandic cortex infarct. 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 No acute hemorrhage, hydrocephalus, or masslike finding Vascular: No acute finding Skull: Unremarkable craniotomy Sinuses/Orbits: Negative IMPRESSION: 1. Small recent cortical infarct at the the high right frontal lobe. 2. Prior high left perirolandic infarct. 3. Aneurysm clipping along the left sided circle-of-Willis. Electronically Signed   By: Monte Fantasia M.D.   On: 08/25/2019 07:22   CT Code Stroke CTA Neck W/WO contrast  Result Date: 08/23/2019 CLINICAL DATA:  64 year old  female code stroke presentation. Left side weakness and numbness. EXAM: CT ANGIOGRAPHY HEAD AND NECK TECHNIQUE: Multidetector CT imaging of the head and neck was performed using the standard protocol during bolus administration of intravenous contrast. Multiplanar CT image reconstructions and MIPs were obtained to evaluate the vascular anatomy. Carotid stenosis measurements (when applicable) are obtained utilizing NASCET criteria, using the distal internal carotid diameter as the denominator. CONTRAST:  34mL OMNIPAQUE IOHEXOL 350 MG/ML SOLN COMPARISON:  Plain head CT 1426 hours today. FINDINGS: CTA NECK Skeleton: Absent dentition. Degenerative changes in the cervical spine with chronic or congenital interbody fusion at C6-C7. Previous left frontotemporal craniotomy. No acute osseous abnormality identified. Upper chest: Small volume retained secretions in the trachea and at the carina. Otherwise negative. Other neck: No acute findings. 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 Right carotid system: Brachiocephalic artery and right CCA plaque without significant stenosis. Extensive soft more so than calcified plaque at the right ICA origin and bulb, the less than 50 % stenosis with respect to the distal vessel. Left carotid system: Moderate mostly soft plaque affecting the proximal left CCA, including eccentric low-density plaque on series 5, image 142, but no hemodynamically significant stenosis. Intermittent plaque to the bifurcation. At the bifurcation only mild plaque at the left ICA origin and bulb. No left ICA stenosis to the skull base. Vertebral arteries: Plaque with 50% stenosis of the right subclavian artery origin. The right vertebral artery origin is identified on series 7, image 270 but the right vertebral is immediately occluded  (image 268) and does not reconstitute until the V3 segment, where the vessel appears diminutive. Small volume of thrombus within the lumen of the left subclavian artery (series 8, image 169) related to complete occlusion of the left vertebral artery origin also. The left vertebral appears likely to be the dominant vessel, and is reconstituted beginning in the V2 segment, although with suboptimal enhancement until the skull base, where vessel enhancement appears more robust. CTA HEAD Posterior circulation: Diminutive intracranial vertebrobasilar system, likely in part related to the bilateral vertebral thrombosis. Dominant although somewhat diminutive left vertebral artery V4 segment is patent to the vertebrobasilar junction. Diminutive right V4 and patent right PICA. Highly diminutive basilar artery (series 5, image 65). Although the basilar remains patent to the SCA is. There are fetal type bilateral PCA origins, however both posterior communicating arteries are diminutive. Bilateral PCA branches are patent. There is mild right and mild to moderate left P2 irregularity and stenosis. Anterior circulation: Both ICA siphons are patent. On the right there is moderate supraclinoid calcified plaque resulting in mild to moderate supraclinoid segment stenosis. The left siphon is patent with only mild supraclinoid calcified plaque. But there it is a saccular  7-8 mm aneurysm at the left ICA terminus (series 8, image 101 and series 9, image 109) arising from the left MCA and ACA origins (series 8, image 100). This is located adjacent to the aneurysm clip. Both posterior communicating artery origins are patent. Patent MCA and ACA origins. Tortuous A1 segments. Anterior communicating artery and bilateral ACA branches are patent. There is a moderate stenosis of the right A2 segment on series 12, image 23. Left MCA M1 segment and bifurcation are patent without stenosis. Left MCA branches are within normal limits. Right MCA M1  segment and bifurcation are patent without stenosis. Right MCA branches are within normal limits. Venous sinuses: Patent. Anatomic variants: Fetal type bilateral PCA origins and suspected dominant left vertebral artery. Review of the MIP images confirms the above findings Preliminary results were discussed by telephone with Dr. Roland Rack at 1442 hours. IMPRESSION: 1. Positive for Bilateral Vertebral Artery Occlusion. Suspect the Left Vertebral is dominant and there is fresh thrombus extending from the left vertebral origin into the lumen of the left subclavian artery. 2. Both vertebral arteries are suboptimally reconstituted distally, especially on the right. Furthermore, a highly diminutive appearance of the distal right vertebral artery and the basilar artery raising the possibility that the right vertebral occlusion might be chronic. 3. No large vessel occlusion identified in the anterior circulation. 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 4. Extensive atherosclerosis, including the aortic arch and great vessels. 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 5. Moderate stenoses of the Left PCA P2, Right ACA A2, supraclinoid Right ICA. Aortic Atherosclerosis (ICD10-I70.0). Electronically Signed   By: Genevie Ann M.D.   On: 08/23/2019 15:00   PERIPHERAL VASCULAR CATHETERIZATION  Result Date: 08/23/2019 Procedure: Ultrasound left arm, left brachial artery retrograde puncture, arch aortogram, abdominal aortogram with bilateral lower extremity runoff Preoperative diagnosis: Rest pain Postoperative diagnosis: Same Anesthesia: Local Operative findings: 1.  Bilateral external iliac artery occlusions 2.  Bilateral superficial femoral artery occlusions 3.  Normal aortic arch anatomy with patent subclavian and axillary arteries bilaterally 4.  Mild tibial disease but overall intact  three-vessel runoff bilaterally Operative details: After obtaining form consent, the patient taken the [facility].  The patient was placed in supine just an angio table.  Patient's left upper extremities prepped and draped in usual sterile fashion.  Ultrasound was used to identify the patient's left brachial artery.  Local anesthesia was infiltrated over this.  A micropuncture needle was used to cannulate left brachial artery near the antecubital area.  Micropuncture wire was advanced into the brachial artery and the micropuncture sheath placed over this.  An 035 versa core wire was then advanced into the left subclavian artery and the micropuncture sheath was swapped out for a 6 French short sheath.  This was thoroughly flushed with 2000 units of heparin and 200 mcg nitroglycerin.  Next the guidewire was directed into the descending thoracic aorta and into the abdominal aorta and a 5 French pigtail catheter advanced over this.  Abdominal aortogram was then obtained in AP projection.  Infrarenal abdominal aorta is patent.  The left and right renal arteries are patent.  The left and right common and internal iliac arteries are patent.  The external iliac arteries are occluded bilaterally.  There are large collaterals coming out of the internal iliacs bilaterally which reconstitute the common femoral artery and profunda  distally.  Next the pigtail catheter was advanced deeper into the aorta and bilateral oblique views of the pelvis were performed which confirmed the above findings.  At this point we went back to AP projection and bilateral lower extremity runoff views were obtained through the pigtail catheter. In the left lower extremity, the left common femoral artery does not opacify much the profunda does opacify there is a flush occlusion of the left superficial femoral artery and the above-knee popliteal artery does reconstitute via collaterals.  There is three-vessel runoff to the right foot there is mild tibial  disease. In the right lower extremity, there are similar findings. At this point the pigtail catheter was pulled back and redirected into the ascending aorta.  An arch aortogram was then obtained in a 40 degree LAO projection.  This shows normal type I arch anatomy with some tortuosity of the subclavian arteries bilaterally but overall no narrowing.  The axillary and subclavian arteries are widely patent bilaterally. This point the pigtail catheter was removed over guidewire.  The sheath was left in place in the left brachial artery to be pulled in the holding area.  The patient tolerated procedure well and there were no complications.  Patient was taken the holding area in stable condition. Operative management: The patient will be scheduled in the near future for axillary bifemoral versus aortobifemoral bypass pending her cardiac and pulmonary evaluations. Ruta Hinds, MD [facility] Office: [phone]   VAS [place] ABI WITH/WO TBI  Result Date: 08/15/2019 LOWER EXTREMITY DOPPLER STUDY Indications: Patient presents today with complaints of worsening pain in the              right lower leg and foot since June 2021. She is unable to walk              much and has difficulty applying pressure to the right leg/foot              when walking. High Risk Factors: Hyperlipidemia, current smoker.  Comparison Study: NA Performing Technologist: Sharlett Iles RVT  Examination Guidelines: A complete evaluation includes at minimum, Doppler waveform signals and systolic blood pressure reading at the level of bilateral brachial, anterior tibial, and posterior tibial arteries, when vessel segments are accessible. Bilateral testing is considered an integral part of a complete examination. Photoelectric Plethysmograph (PPG) waveforms and toe systolic pressure readings are included as required and additional duplex testing as needed. Limited examinations for reoccurring indications may be  performed as noted.  ABI Findings: +---------+------------------+-----+-------------------+-----------------------+ Right    Rt Pressure (mmHg)IndexWaveform           Comment                 +---------+------------------+-----+-------------------+-----------------------+ Brachial 228                                                               +---------+------------------+-----+-------------------+-----------------------+ ATA      38                0.17 dampened monophasicdifficulty to access;  possible occlusion vs                                                      near occlusion          +---------+------------------+-----+-------------------+-----------------------+ PTA      25                0.11 dampened monophasicdifficulty to access;                                                      possible occlusion vs                                                      near occlusion          +---------+------------------+-----+-------------------+-----------------------+ PERO     0                 0.00 absent                                     +---------+------------------+-----+-------------------+-----------------------+ Great Toe0                 0.00 Absent                                     +---------+------------------+-----+-------------------+-----------------------+ +---------+------------------+-----+----------+-------+ Left     Lt Pressure (mmHg)IndexWaveform  Comment +---------+------------------+-----+----------+-------+ Brachial 228                                      +---------+------------------+-----+----------+-------+ ATA      86                0.38 monophasic        +---------+------------------+-----+----------+-------+ PTA      83                0.36 monophasic        +---------+------------------+-----+----------+-------+ PERO     0                  0.00 absent            +---------+------------------+-----+----------+-------+ Great Toe47                0.21 Abnormal          +---------+------------------+-----+----------+-------+ +-------+-----------+-----------+------------+------------+ ABI/TBIToday's ABIToday's TBIPrevious ABIPrevious TBI +-------+-----------+-----------+------------+------------+ Right  .17        0.0                                 +-------+-----------+-----------+------------+------------+ Left   .38        .21                                 +-------+-----------+-----------+------------+------------+  Summary: Right: Resting right ankle-brachial index indicates critical limb ischemia. The right toe-brachial index is abnormal. 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 The left toe-brachial index is abnormal.  *See table(s) above for measurements and observations.  Vascular consult recommended. Electronically signed by Ruta Hinds MD on 08/15/2019 at 11:36:10 AM.    Final    ECHOCARDIOGRAM COMPLETE  Result Date: 08/24/2019    ECHOCARDIOGRAM REPORT   Patient Name:   Katherine Archer Date of Exam: 08/24/2019 Medical Rec #:  [id number]   Height:       66.0 in Accession #:    [id number]  Weight:       129.0 lb Date of Birth:  Apr 21, 1955    BSA:          1.660 m Patient Age:    28 years    BP:           176/57 mmHg Patient Gender: F           HR:           58 bpm. Exam Location:  Inpatient Procedure: 2D Echo, Cardiac Doppler and Color Doppler Indications:    Stroke  History:        Patient has no prior history of Echocardiogram examinations.                 Risk Factors:Current Smoker, Dyslipidemia and Hypertension. PAD.  Sonographer:    Clayton Lefort RDCS (AE) Referring Phys: [phone] [facility] P [place]  1. Left ventricular ejection fraction, by estimation, is 70 to 75%. The left ventricle has hyperdynamic function. The left ventricle has no regional wall  motion abnormalities. There is moderate concentric left ventricular hypertrophy. Left ventricular diastolic parameters are consistent with Grade I diastolic dysfunction (impaired relaxation). Elevated left atrial pressure.  2. Right ventricular systolic function is normal. The right ventricular size is normal. There is moderately elevated pulmonary artery systolic pressure.  3. The mitral valve is normal in structure. Mild mitral valve regurgitation. No evidence of mitral stenosis.  4. The aortic valve is normal in structure. Aortic valve regurgitation is not visualized. No aortic stenosis is present.  5. The inferior vena cava is dilated in size with <50% respiratory variability, suggesting right atrial pressure of 15 mmHg. Conclusion(s)/Recommendation(s): No intracardiac source of embolism detected on this transthoracic study. A transesophageal echocardiogram is recommended to exclude cardiac source of embolism if clinically indicated. FINDINGS  Left Ventricle: Left ventricular ejection fraction, by estimation, is 70 to 75%. The left ventricle has hyperdynamic function. The left ventricle has no regional wall motion abnormalities. The left ventricular internal cavity size was normal in size. There is moderate concentric left ventricular hypertrophy. Left ventricular diastolic parameters are consistent with Grade I diastolic dysfunction (impaired relaxation). Elevated left atrial pressure. Right Ventricle: The right ventricular size is normal. No increase in right ventricular wall thickness. Right ventricular systolic function is normal. There is moderately elevated pulmonary artery systolic pressure. The tricuspid regurgitant velocity is 2.80 m/s, and with an assumed right atrial pressure of 15 mmHg, the estimated right ventricular systolic pressure is 19.6 mmHg. Left Atrium: Left atrial size was normal in size. Right Atrium: Right atrial size was normal in size. Pericardium: There is no evidence of pericardial  effusion. Mitral Valve: The mitral valve is normal in structure. Normal mobility of the mitral valve leaflets. Mild mitral valve regurgitation. No evidence of mitral valve stenosis. MV peak gradient, 5.7 mmHg. The mean mitral valve gradient  is 1.0 mmHg. Tricuspid Valve: The tricuspid valve is normal in structure. Tricuspid valve regurgitation is mild . No evidence of tricuspid stenosis. Aortic Valve: The aortic valve is normal in structure. Aortic valve regurgitation is not visualized. No aortic stenosis is present. Aortic valve mean gradient measures 5.0 mmHg. Aortic valve peak gradient measures 11.6 mmHg. Aortic valve area, by VTI measures 2.00 cm. Pulmonic Valve: The pulmonic valve was normal in structure. Pulmonic valve regurgitation is not visualized. No evidence of pulmonic stenosis. Aorta: The aortic root is normal in size and structure. Venous: The inferior vena cava is dilated in size with less than 50% respiratory variability, suggesting right atrial pressure of 15 mmHg. IAS/Shunts: No atrial level shunt detected by color flow Doppler.  LEFT VENTRICLE PLAX 2D LVIDd:         4.30 cm  Diastology LVIDs:         2.80 cm  LV e' lateral:   6.20 cm/s LV PW:         1.20 cm  LV E/e' lateral: 17.7 LV IVS:        1.70 cm  LV e' medial:    6.64 cm/s LVOT diam:     1.80 cm  LV E/e' medial:  16.6 LV SV:         77 LV SV Index:   46 LVOT Area:     2.54 cm  RIGHT VENTRICLE             IVC RV Basal diam:  3.20 cm     IVC diam: 2.60 cm RV S prime:     16.00 cm/s TAPSE (M-mode): 2.2 cm LEFT ATRIUM             Index       RIGHT ATRIUM           Index LA diam:        3.20 cm 1.93 cm/m  RA Area:     16.30 cm LA Vol (A2C):   67.1 ml 40.43 ml/m RA Volume:   44.00 ml  26.51 ml/m LA Vol (A4C):   48.4 ml 29.16 ml/m LA Biplane Vol: 59.9 ml 36.09 ml/m  AORTIC VALVE AV Area (Vmax):    2.28 cm AV Area (Vmean):   1.99 cm AV Area (VTI):     2.00 cm AV Vmax:           170.00 cm/s AV Vmean:          108.000 cm/s AV VTI:             0.383 m AV Peak Grad:      11.6 mmHg AV Mean Grad:      5.0 mmHg LVOT Vmax:         152.00 cm/s LVOT Vmean:        84.300 cm/s LVOT VTI:          0.301 m LVOT/AV VTI ratio: 0.79  AORTA Ao Root diam: 3.10 cm Ao Asc diam:  3.10 cm MITRAL VALVE                TRICUSPID VALVE MV Area (PHT): 3.37 cm     TR Peak grad:   31.4 mmHg MV Peak grad:  5.7 mmHg     TR Vmax:        280.00 cm/s MV Mean grad:  1.0 mmHg MV Vmax:       1.19 m/s     SHUNTS MV Vmean:  51.8 cm/s    Systemic VTI:  0.30 m MV Decel Time: 225 msec     Systemic Diam: 1.80 cm MV E velocity: 110.00 cm/s MV A velocity: 72.20 cm/s MV E/A ratio:  1.52 Ena Dawley MD Electronically signed by Ena Dawley MD Signature Date/Time: 08/24/2019/11:51:59 AM    Final    CT HEAD CODE STROKE WO CONTRAST  Result Date: 08/23/2019 CLINICAL DATA:  Code stroke.  64 year old female left side weakness. EXAM: CT HEAD WITHOUT CONTRAST TECHNIQUE: Contiguous axial images were obtained from the base of the skull through the vertex without intravenous contrast. COMPARISON:  Head CT 11/05/2013. 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 Cerebral volume is not significantly changed since 2015. No midline shift, mass effect, or evidence of intracranial mass lesion. No ventriculomegaly. No acute intracranial hemorrhage identified. No cortically based acute infarct identified. Vascular: Left ICA terminus region aneurysm clip. Mild Calcified atherosclerosis at the skull base. No suspicious intracranial vascular hyperdensity. Skull: Previous left frontotemporal craniotomy. No acute osseous abnormality identified. Sinuses/Orbits: Visualized paranasal sinuses and mastoids are stable and well pneumatized. Other: No acute orbit or scalp soft tissue finding. ASPECTS [facility] Stroke Program Early CT Score) Total score (0-10 with 10 being normal): 10 IMPRESSION: 1. No acute cortically based infarct or acute  intracranial hemorrhage identified. 2. 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 3. These results were communicated to Dr. Leonel Ramsay at 2:32 pm on 08/23/2019 by text page via the [facility] messaging system. Electronically Signed   By: Genevie Ann M.D.   On: 08/23/2019 14:33    Labs:  CBC: Recent Labs    08/23/19 0753 08/23/19 1816 09/11/19 1202  WBC  --  6.4 5.9  HGB 13.3 14.4 13.5  HCT 39.0 43.7 40.5  PLT  --  156 301    COAGS: Recent Labs    08/23/19 1423 09/11/19 1202  INR 1.1 1.0  APTT 31 29    BMP: Recent Labs    08/23/19 0753 08/23/19 1816 09/02/19 1545 09/11/19 1202  NA 138 139 136 135  K 4.5 4.1 4.1 3.1*  CL 104 107 97 97*  CO2  --  23 23 29   GLUCOSE 102* 97 98 107*  BUN 8 7* 14 12  CALCIUM  --  9.5 10.2 9.9  CREATININE 0.70 0.76 0.75 0.74  GFRNONAA  --  >60 86 >60  GFRAA  --  >60 99 >60    LIVER FUNCTION TESTS: Recent Labs    08/23/19 1816 09/11/19 1202  BILITOT 0.7 0.9  AST 14* 24  ALT 15 25  ALKPHOS 76 85  PROT 6.7 7.5  ALBUMIN 3.7 4.0    TUMOR MARKERS: No results for input(s): AFPTM, CEA, CA199, CHROMGRNA in the last 8760 hours.  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  Patient does have her risk factors of hypertension, smoking.  There is no apparent family history of brain aneurysms.  I briefly also discussed forcefully endovascular treatment with primary coiling and/or use of flow diversion risks.  The patient and  her daughter were informed the  endovascular intracranial aneurysm.  Time was also spent to review the patient's current intra aneurysm will be planned after her lower extremity revascularization procedure. The patient and daughter were informed of the possibility of a direct left carotid approach to the end of treatment of her aneurysm.  This will be coordinated with Dr. Oneida Alar Plan for follow-up post lower extremity revascularization.  All questions answered and concerns addressed. Patient conveys understanding and agrees with plan.  Thank you for this interesting consult.  I greatly enjoyed meeting Karrah Mangini and look forward to participating in their care.  A copy of this report was sent to the requesting provider on this date.  Electronically Signed: Rob Hickman, MD 09/11/2019, 2:14 PM   I spent a total of 30 minutes in face to face in clinical consultation, greater than 50% of which was counseling/coordinating care for discussing the planning of the ends of treatment following revascularization of the lower extremities.  Considerable time was spent in educating the patient and the daughter regarding the natural history of unruptured unruptured intracranial aneurysms brain aneurysm brain aneurysm.  Also discussed was the recommendation also discussed was screening of family members given the history of ruptured brain..  Aneurysm in the patient.

## 2019-09-12 ENCOUNTER — Other Ambulatory Visit (HOSPITAL_COMMUNITY)
Admission: RE | Admit: 2019-09-12 | Discharge: 2019-09-12 | Disposition: A | Discharge status: Home / Self Care | Payer: Medicare HMO | Source: Ambulatory Visit | Attending: Vascular Surgery | Admitting: Vascular Surgery

## 2019-09-12 ENCOUNTER — Telehealth: Payer: Self-pay

## 2019-09-12 ENCOUNTER — Telehealth: Payer: Self-pay | Admitting: Cardiology

## 2019-09-12 ENCOUNTER — Ambulatory Visit: Payer: Medicare Other | Admitting: Vascular Surgery

## 2019-09-12 ENCOUNTER — Other Ambulatory Visit (HOSPITAL_COMMUNITY): Payer: Medicare HMO

## 2019-09-12 DIAGNOSIS — Z20822 Contact with and (suspected) exposure to covid-19: Secondary | ICD-10-CM | POA: Insufficient documentation

## 2019-09-12 DIAGNOSIS — Z01812 Encounter for preprocedural laboratory examination: Secondary | ICD-10-CM | POA: Diagnosis not present

## 2019-09-12 LAB — SARS CORONAVIRUS 2 (TAT 6-24 HRS): SARS Coronavirus 2: NEGATIVE

## 2019-09-12 NOTE — Telephone Encounter (Signed)
Unfortunately I cannot clear the patient for upcoming procedure.  I did recommend to the patient that she needed a coronary CTA with made arrangements for September 2 appears that she did not get this testing done.  She is undergoing a high risk procedure and based on her METS, symptoms and risk factors she need an ischemic evaluation prior to surgery. This was made clear during her visit and with the interpreter.

## 2019-09-12 NOTE — Telephone Encounter (Signed)
[employer] returning call. States that their phone lines are shutting down early and will need a call back ASAP. She was willing to wait. Phone got disconnected so not sure if their phone lines went down.

## 2019-09-12 NOTE — Telephone Encounter (Signed)
Reviewed Dr. Terrial Rhodes office telephone note. Pt unable to be cleared for surgery without ischemic evaluation prior. Dr. Oneida Alar updated of surgery cancellation.    Spoke with patient via language interpreter and verbal permission given to speak with daughter Zola Button. Informed of surgery cancellation d/t cardiac clearance needed. Advised pt was to completed a coronary ct on 9/2. Pt states she was unaware of this. Provided phone number for Dr. Terrial Rhodes office to call for more information and to reschedule test. Daughter requested to go ahead and reschedule surgery. Will tentatively schedule pt for 10/18 pending cardiac clearance. Voiced understanding.

## 2019-09-12 NOTE — Telephone Encounter (Signed)
Received a phone call from Dr. Oneida Alar office, named Herma Ard, wanted to see if patient has completed everything needed for her to have surgery. Please call to verify at [phone] or [phone]

## 2019-09-12 NOTE — Telephone Encounter (Signed)
Received a phone call from [place] back

## 2019-09-12 NOTE — Progress Notes (Signed)
Anesthesia Chart Review:  On 08/23/19 brought in as an outpatient for bilateral lower extremity arteriogram via left brachial artery approach for rest pain of right lower extremity.  She tolerated the procedure well however postoperatively experienced strokelike symptoms.  Code stroke was called and patient was evaluated by neurology.  Patient was diagnosed with right frontal lobe embolic infarct.  Recommendations included patient to take aspirin, Plavix, and statin.  There was no neurological intervention indicated at the time.  Based on angiography it was determined she would require right axillary artery -bifemoral bypass.  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  Seen by Dr. Estanislado Pandy 09/11/2019 and discussed treatment of aneurysm and her upcoming vascular surgery procedure.  Dr. Estanislado Pandy plans to follow-up with the patient after vascular surgery is completed.  Post discharge patient followed up outpatient with cardiology on 09/02/2019. Per Dr. Terrial Rhodes note, "This patient is very high risk for coronary artery disease.  At this time I like to pursue an ischemic evaluation in this patient given her previous procedure clearance as well as her abnormal EKG and risk factors.  A coronary CTA would be the most appropriate test at this time.  I discussed with the patient and educated her about this test and she is agreeable to proceed.  We were able to get the patient is scheduled for Thursday, September 2 at 4 PM."  Unfortunately, the patient never had this testing completed.  Per note by Dr. Harriet Masson 09/12/2019, "Unfortunately I cannot clear the patient for upcoming procedure.  I did recommend to the patient that she needed a coronary CTA with made arrangements for September 2 appears that she did not get this  testing done.  She is undergoing a high risk procedure and based on her METS, symptoms and risk factors she need an ischemic evaluation prior to surgery. This was made clear during her visit and with the interpreter."  Case will be canceled this time, pending completion of cardiac testing.  Wynonia Musty [facility] Short Stay Center/Anesthesiology Phone [phone] 09/12/2019 5:22 PM

## 2019-09-12 NOTE — Telephone Encounter (Signed)
Attempted to call number back. No answer and no voicemail.

## 2019-09-12 NOTE — Telephone Encounter (Addendum)
Spoke with Hayley/Dr. Tobb's office who verified pt did not have Coronary CTA completed. She will discuss with Dr. Harriet Masson of whether pt is cleared to proceed with surgery without having CTA completed or if this is needed prior for further recommendations. Dr. Oneida Alar updated on situation.

## 2019-09-12 NOTE — Telephone Encounter (Signed)
Spoke to [employer]. She reports the patient is supposed to have axillary bifemoral vs aorta bifemoral bypass graft tomorrow morning. Per Dr. Terrial Rhodes last note patient was to have coronary ct on 09/05/19 however there is no results. They need to know if patient can still have surgery even though we don't have results to ct. Will check with Dr. Harriet Masson.

## 2019-09-12 NOTE — Telephone Encounter (Signed)
Late entry for 09/12/19 at 1030AM-  Received inquiry from Lorretta Harp, PA whether pt was cardiac cleared to proceed with Axillary Bifemoral vs Aortobifemoral bypass graft surgery scheduled tomorrow with Dr. Oneida Alar based on documentation from M. Eveland and Dr. Harriet Masson and since pt did not complete her coronary CTA as recommended on 09/05/19.   Contacted Dr. Terrial Rhodes office. Left message requesting a return call to discuss the above information and cardiac/clearance recommendations.   15- Received return call from [place], PA following up on clearance status.   Attempted to reach nurse, Hayley B. On two attempts. Informed personnel that our office phone lines will close early but nurse can send interoffice message and I will return call back when she's available. Voiced understanding.

## 2019-09-13 ENCOUNTER — Inpatient Hospital Stay (HOSPITAL_COMMUNITY): Admission: RE | Admit: 2019-09-13 | Payer: Medicare HMO | Source: Home / Self Care | Admitting: Vascular Surgery

## 2019-09-13 ENCOUNTER — Encounter (HOSPITAL_COMMUNITY): Admission: RE | Payer: Self-pay | Source: Home / Self Care

## 2019-09-13 ENCOUNTER — Telehealth: Payer: Self-pay | Admitting: Cardiology

## 2019-09-13 SURGERY — CREATION, BYPASS, ARTERIAL, AXILLARY TO BILATERAL FEMORAL, USING GRAFT
Anesthesia: General | Laterality: Bilateral

## 2019-09-13 NOTE — Telephone Encounter (Signed)
Follow up:     Patient daughter returning a call back to the nurse concering her mother's medical clearance,

## 2019-09-13 NOTE — Telephone Encounter (Signed)
Called and spoke to patient daughter per patient verbal permission. Informed her that the patient was not cleared for surgery because her cardiac ct has not been done yet. I advised her these tests can take up to 6 weeks to be approved by insurance. However will follow up with this and then let daughter know where we are with it.

## 2019-09-13 NOTE — Telephone Encounter (Signed)
Called daughter back. Let her know that the approval is pending for second time. First time patient cancelled request when she was contacted by [employer]. Patient daughter thinks this was due to a language barrier since the patient doesn't speak good english. If contacted again I advised her to have the patient ask for an interpreter.

## 2019-09-17 ENCOUNTER — Telehealth: Payer: Self-pay

## 2019-09-17 NOTE — Telephone Encounter (Signed)
[employer] back. They couldn't find any record of a call or anything needed. If further information needed someone will call back.

## 2019-09-17 NOTE — Telephone Encounter (Signed)
Spoke with pts daughter Katherine Archer per verbal permission from pt. She reports insurance approval was received for coronary ct and inquired about scheduling. Advised she would need to contact Dr. Terrial Rhodes office to arrange test. She verified provider number. Advised once patient cleared, our office will reschedule pts procedure with Dr. Oneida Alar. Daughter verbalized understanding.

## 2019-09-17 NOTE — Telephone Encounter (Signed)
Thayer Headings states she is returning a call in regards to the patient's prior authorization. Please advise.

## 2019-09-23 ENCOUNTER — Telehealth (HOSPITAL_COMMUNITY): Payer: Self-pay | Admitting: *Deleted

## 2019-09-23 DIAGNOSIS — I10 Essential (primary) hypertension: Secondary | ICD-10-CM | POA: Diagnosis not present

## 2019-09-23 DIAGNOSIS — Z681 Body mass index (BMI) 19 or less, adult: Secondary | ICD-10-CM | POA: Diagnosis not present

## 2019-09-23 NOTE — Telephone Encounter (Signed)
Reaching out to patient to offer assistance regarding upcoming cardiac imaging study; pt's daughter verbalizes understanding of appt date/time, parking situation and where to check in, pre-test NPO status and medications ordered, and verified current allergies; name and call back number provided for further questions should they arise  [facility] [phone] office [phone] cell

## 2019-09-25 ENCOUNTER — Ambulatory Visit (HOSPITAL_COMMUNITY)
Admission: RE | Admit: 2019-09-25 | Discharge: 2019-09-25 | Disposition: A | Discharge status: Home / Self Care | Payer: Medicare HMO | Source: Ambulatory Visit | Attending: Cardiology | Admitting: Cardiology

## 2019-09-25 ENCOUNTER — Other Ambulatory Visit: Payer: Self-pay

## 2019-09-25 DIAGNOSIS — Z681 Body mass index (BMI) 19 or less, adult: Secondary | ICD-10-CM | POA: Diagnosis not present

## 2019-09-25 DIAGNOSIS — R931 Abnormal findings on diagnostic imaging of heart and coronary circulation: Secondary | ICD-10-CM | POA: Diagnosis not present

## 2019-09-25 DIAGNOSIS — I739 Peripheral vascular disease, unspecified: Secondary | ICD-10-CM

## 2019-09-25 DIAGNOSIS — I2510 Atherosclerotic heart disease of native coronary artery without angina pectoris: Secondary | ICD-10-CM | POA: Insufficient documentation

## 2019-09-25 DIAGNOSIS — Z8249 Family history of ischemic heart disease and other diseases of the circulatory system: Secondary | ICD-10-CM | POA: Diagnosis not present

## 2019-09-25 IMAGING — CT CT HEART MORP W/ CTA COR W/ SCORE W/ CA W/CM &/OR W/O CM
2 of 9 series · 9 of 20 positions shown, 11 images · IV contrast (APPLIED)
Comparison: None.
COMPARISON: None.

Addendum:
EXAM:
OVER-READ INTERPRETATION  CT CHEST

The following report is an over-read performed by radiologist Dr.
JAAKKO-MATTI [REDACTED] on [DATE]. This over-read
does not include interpretation of cardiac or coronary anatomy or
pathology. The coronary CTA interpretation by the cardiologist is
attached.
CLINICAL DATA: 64 year old female with abnormal EKG. Medical
history includes Peripheral artery disease, Hypertension and tobacco
use.
Cardiac/Coronary  CT
TECHNIQUE: The patient was scanned on a [employer] Force scanner.

[Series 8: 0-90% · axial · 0.39mm/px · z∈[-36,+32]mm · 4 of 1880 slices shown]
[im 376/1880  vessel]
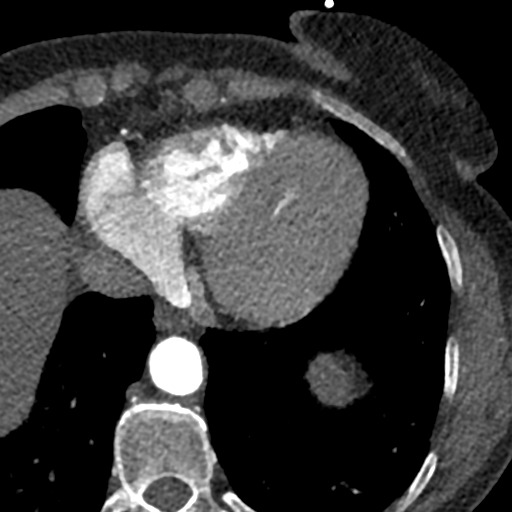
[im 752/1880  vessel]
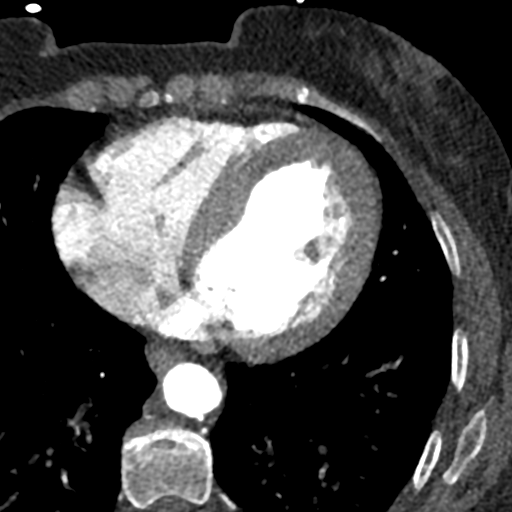
[im 1128/1880  vessel]
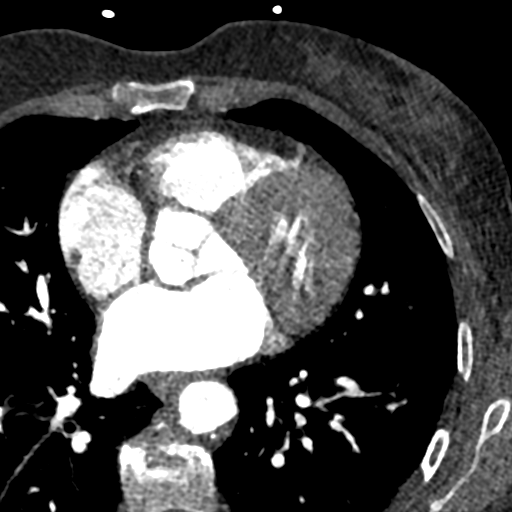
[im 1504/1880  vessel]
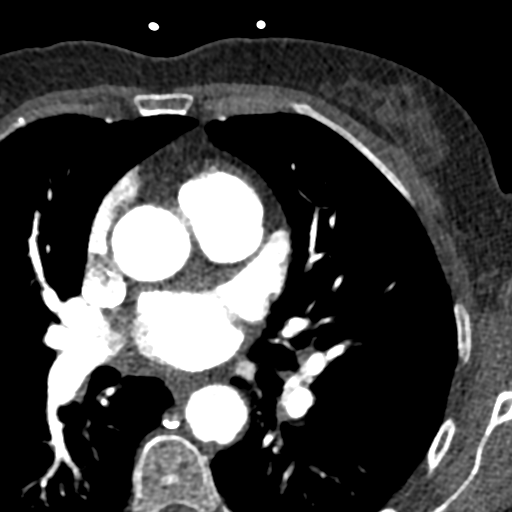

[Series 9: 5-95% · axial · 0.39mm/px · z∈[-40,+35]mm · 5 of 1880 slices shown, 7 images]
[im 314/1880  vessel]
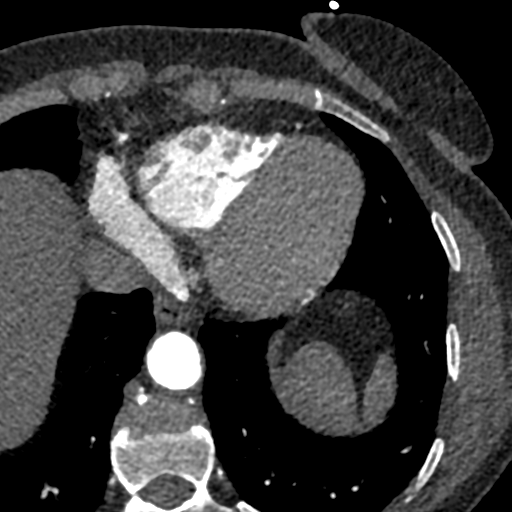
[im 314/1880  lung]
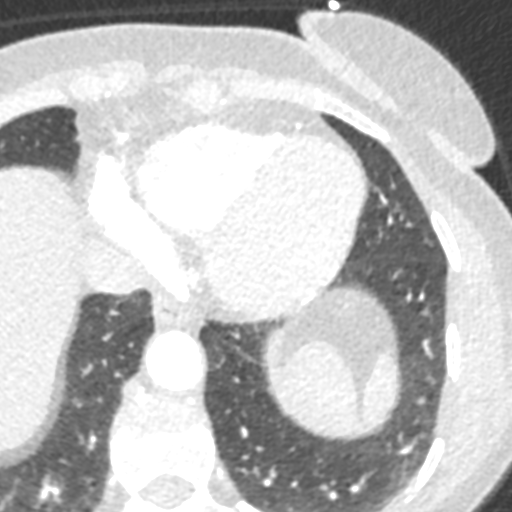
[im 627/1880  vessel]
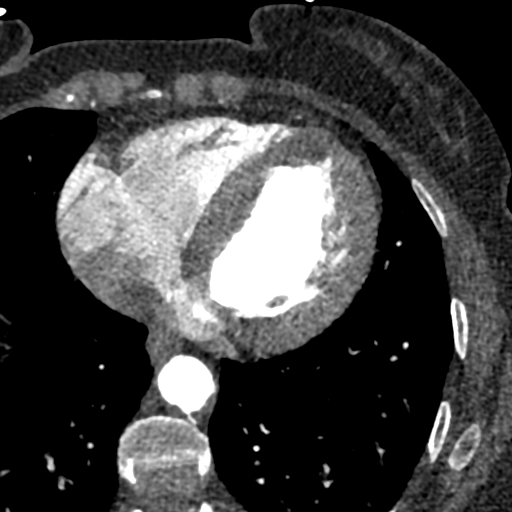
[im 940/1880  vessel]
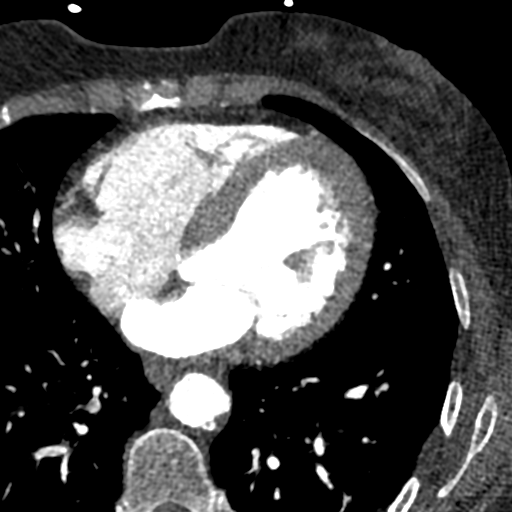
[im 1253/1880  vessel]
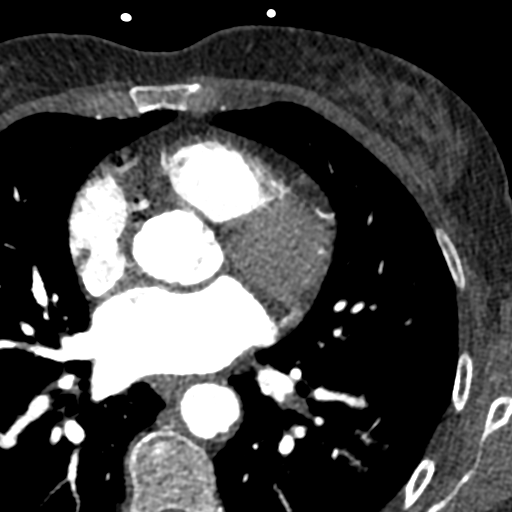
[im 1566/1880  vessel]
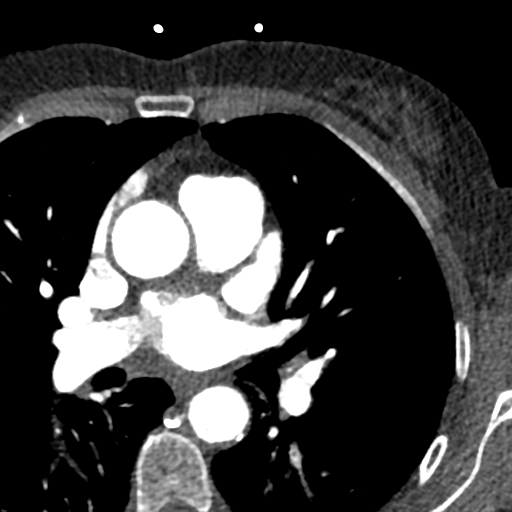
[im 1566/1880  lung]
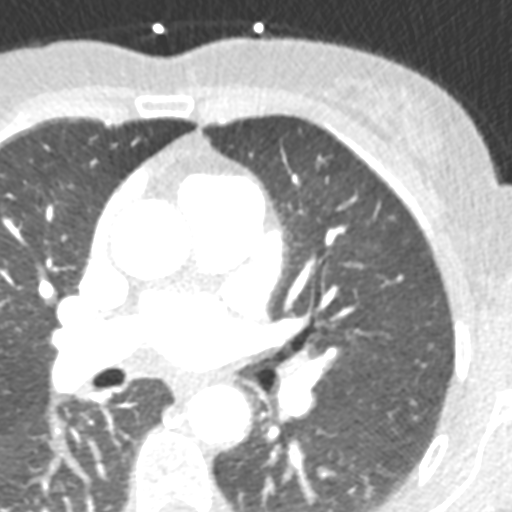

[9 of 20 positions shown; findings below may reference images not displayed]

FINDINGS: Vascular: Heart is normal size. Aorta is normal caliber. Irregular
calcified and noncalcified plaque in the descending thoracic aorta.

Mediastinum/Nodes: No adenopathy.

Lungs/Pleura: No confluent opacities or effusions.

Upper Abdomen: Imaging into the upper abdomen demonstrates no acute
findings.

Musculoskeletal: Chest wall soft tissues are unremarkable. Mild
compression fracture in the mid thoracic spine, age indeterminate.
IMPRESSION: Irregular calcified and noncalcified plaque in the descending
thoracic aorta.

Mild compression fracture in the midthoracic spine, age
indeterminate.
FINDINGS: A 120 kV prospective scan was triggered in the descending thoracic
aorta at 111 HU's. Axial non-contrast 3 mm slices were carried out
through the heart. The data set was analyzed on a dedicated work
station and scored using the Agatson method. Gantry rotation speed
was 250 msecs and collimation was .6 mm. No beta blockade and 0.8 mg
of sl NTG was given. The 3D data set was reconstructed in 5%
intervals of the 67-82 % of the R-R cycle. Diastolic phases were
analyzed on a dedicated work station using MPR, MIP and VRT modes.
The patient received 80 cc of contrast.

Aorta: Normal size. Mild aortic root calcifications. No dissection.

Aortic Valve:  Trileaflet.  No calcifications.

Coronary Arteries:  Normal coronary origin.  Right dominance.

RCA is a large dominant artery that gives rise to PDA and PLVB.
There is minimal luminal irregularities in the proximal. Stair step
artifact in the mid RCA. Mid and distal RCA not well visualized.

Left main is a large artery that gives rise to LAD and LCX arteries.

LAD is a large vessel. Minimal (<24%) calcified plaque in the
proximal LAD. Minimal (<24%) soft plaque in the mid LAD. The distal
LAD with no plaques. The is mild myocardial bridging in the mid LAD.
D1 and D2 with minimal luminal irregularities.

LCX is a non-dominant artery that gives rise to one large OM1
branch. There is minimal (<24%) soft plaque in the mid LCX.

Other findings:

Normal pulmonary vein drainage into the left atrium.

Normal left atrial appendage without a thrombus.

Normal size of the pulmonary artery.
IMPRESSION: 1. Coronary calcium score of 13. This was 78 percentile for age and
sex matched control.

2. Normal coronary origin with right dominance.

3. Minimal CAD. CADRADs 1. Recommend medical therapy for primary
prevention. This study will be send for FFR due the RCA not well
visualized in the mid and distal portion for any flow limiting
lesions.

JAAKKO-MATTI, DO

*** End of Addendum ***
EXAM:
OVER-READ INTERPRETATION  CT CHEST

The following report is an over-read performed by radiologist Dr.
JAAKKO-MATTI [REDACTED] on [DATE]. This over-read
does not include interpretation of cardiac or coronary anatomy or
pathology. The coronary CTA interpretation by the cardiologist is
attached.
FINDINGS: Vascular: Heart is normal size. Aorta is normal caliber. Irregular
calcified and noncalcified plaque in the descending thoracic aorta.

Mediastinum/Nodes: No adenopathy.

Lungs/Pleura: No confluent opacities or effusions.

Upper Abdomen: Imaging into the upper abdomen demonstrates no acute
findings.

Musculoskeletal: Chest wall soft tissues are unremarkable. Mild
compression fracture in the mid thoracic spine, age indeterminate.
IMPRESSION: Irregular calcified and noncalcified plaque in the descending
thoracic aorta.

Mild compression fracture in the midthoracic spine, age
indeterminate.

## 2019-09-25 MED ORDER — IOHEXOL 350 MG/ML SOLN
80.0000 mL | Freq: Once | INTRAVENOUS | Status: AC | PRN
  Administered 2019-09-25: 80 mL via INTRAVENOUS

## 2019-09-25 MED ORDER — NITROGLYCERIN 0.4 MG SL SUBL: 0.8000 mg | SUBLINGUAL_TABLET | Freq: Once | SUBLINGUAL | Status: DC

## 2019-09-27 DIAGNOSIS — Z8249 Family history of ischemic heart disease and other diseases of the circulatory system: Secondary | ICD-10-CM | POA: Diagnosis not present

## 2019-09-27 DIAGNOSIS — Z681 Body mass index (BMI) 19 or less, adult: Secondary | ICD-10-CM

## 2019-09-27 DIAGNOSIS — R931 Abnormal findings on diagnostic imaging of heart and coronary circulation: Secondary | ICD-10-CM | POA: Diagnosis not present

## 2019-09-27 DIAGNOSIS — I2510 Atherosclerotic heart disease of native coronary artery without angina pectoris: Secondary | ICD-10-CM | POA: Diagnosis not present

## 2019-09-27 DIAGNOSIS — I739 Peripheral vascular disease, unspecified: Secondary | ICD-10-CM | POA: Diagnosis not present

## 2019-09-30 ENCOUNTER — Telehealth: Payer: Self-pay | Admitting: Cardiology

## 2019-09-30 NOTE — Telephone Encounter (Signed)
Patient's daughter called wanting to know if tomorrow's appt is necessary as her mother didn't have surgery done.

## 2019-10-02 ENCOUNTER — Other Ambulatory Visit: Payer: Self-pay

## 2019-10-02 ENCOUNTER — Telehealth: Payer: Self-pay

## 2019-10-02 DIAGNOSIS — E785 Hyperlipidemia, unspecified: Secondary | ICD-10-CM | POA: Insufficient documentation

## 2019-10-02 DIAGNOSIS — I10 Essential (primary) hypertension: Secondary | ICD-10-CM | POA: Insufficient documentation

## 2019-10-02 NOTE — Telephone Encounter (Signed)
-----   Message from Berniece Salines, DO sent at 10/02/2019 11:49 AM EDT ----- Her coronary CTA showed evidence of coronary artery disease, not enough to warrant intervention.

## 2019-10-02 NOTE — Telephone Encounter (Signed)
Tried calling patient. No answer and no voicemail set up for me to leave a message. 

## 2019-10-03 ENCOUNTER — Telehealth: Payer: Self-pay | Admitting: Cardiology

## 2019-10-03 ENCOUNTER — Encounter: Payer: Self-pay | Admitting: Cardiology

## 2019-10-03 ENCOUNTER — Other Ambulatory Visit: Payer: Self-pay

## 2019-10-03 ENCOUNTER — Ambulatory Visit (INDEPENDENT_AMBULATORY_CARE_PROVIDER_SITE_OTHER): Payer: Medicare HMO | Admitting: Cardiology

## 2019-10-03 ENCOUNTER — Telehealth: Payer: Self-pay

## 2019-10-03 VITALS — BP 122/52 | HR 77 | Ht 66.0 in | Wt 120.2 lb

## 2019-10-03 DIAGNOSIS — I739 Peripheral vascular disease, unspecified: Secondary | ICD-10-CM | POA: Diagnosis not present

## 2019-10-03 DIAGNOSIS — E782 Mixed hyperlipidemia: Secondary | ICD-10-CM

## 2019-10-03 DIAGNOSIS — I2510 Atherosclerotic heart disease of native coronary artery without angina pectoris: Secondary | ICD-10-CM

## 2019-10-03 DIAGNOSIS — I10 Essential (primary) hypertension: Secondary | ICD-10-CM

## 2019-10-03 DIAGNOSIS — Z720 Tobacco use: Secondary | ICD-10-CM

## 2019-10-03 HISTORY — DX: Essential (primary) hypertension: I10

## 2019-10-03 HISTORY — DX: Atherosclerotic heart disease of native coronary artery without angina pectoris: I25.10

## 2019-10-03 HISTORY — DX: Mixed hyperlipidemia: E78.2

## 2019-10-03 NOTE — Telephone Encounter (Signed)
-----   Message from Berniece Salines, DO sent at 10/02/2019 11:49 AM EDT ----- Her coronary CTA showed evidence of coronary artery disease, not enough to warrant intervention.

## 2019-10-03 NOTE — Patient Instructions (Signed)
Medication Instructions:  Your physician recommends that you continue on your current medications as directed. Please refer to the Current Medication list given to you today.  *If you need a refill on your cardiac medications before your next appointment, please call your pharmacy*   Lab Work: None ordered   If you have labs (blood work) drawn today and your tests are completely normal, you will receive your results only by: . MyChart Message (if you have MyChart) OR . A paper copy in the mail If you have any lab test that is abnormal or we need to change your treatment, we will call you to review the results.   Testing/Procedures: None ordered    Follow-Up: At [facility], you and your health needs are our priority.  As part of our continuing mission to provide you with exceptional heart care, we have created designated Provider Care Teams.  These Care Teams include your primary Cardiologist (physician) and Advanced Practice Providers (APPs -  Physician Assistants and Nurse Practitioners) who all work together to provide you with the care you need, when you need it.  We recommend signing up for the patient portal called "MyChart".  Sign up information is provided on this After Visit Summary.  MyChart is used to connect with patients for Virtual Visits (Telemedicine).  Patients are able to view lab/test results, encounter notes, upcoming appointments, etc.  Non-urgent messages can be sent to your provider as well.   To learn more about what you can do with MyChart, go to [email or web address].    Your next appointment:   3 month(s)  The format for your next appointment:   In Person  Provider:   Kardie Tobb, DO   Other Instructions None   

## 2019-10-03 NOTE — Telephone Encounter (Signed)
Patient's daughter would like someone to call her about her mother's appt today.

## 2019-10-03 NOTE — Progress Notes (Signed)
Cardiology Office Note:    Date:  10/03/2019   ID:  Katherine Archer, DOB [birth year], MRN [id number]  PCP:  [name], MD  Cardiologist:  Berniece Salines, DO  Electrophysiologist:  None   Referring MD: [name], MD   Follow up visit  History of Present Illness:    Katherine Archer is a 64 y.o. female with a hx of carotid disease, coronary disease recently seen on a coronary CTA, hypertension, hyperlipidemia, history of stroke comes today for follow-up visit. Did see the patient back in August at that time she was planning surgery. At that time I recommended patient undergo a coronary CTA to evaluate her coronaries prior to her procedure. In the interim she was able to get a coronary CTA she is here today to discuss results.  Past Medical History:  Diagnosis Date  . Cervical polyp 09/18/2018  . Encounter for screening mammogram for breast cancer 05/05/2016  . Fibrocystic breast changes of both breasts 04/29/2015  . Hyperlipidemia   . Hypertension   . PAD (peripheral artery disease) ([place]) 08/23/2019  . Stroke [facility]) 08/23/2019    Past Surgical History:  Procedure Laterality Date  . ABDOMINAL AORTOGRAM W/LOWER EXTREMITY Bilateral 08/23/2019   Procedure: ABDOMINAL AORTOGRAM W/ Bilateral LOWER EXTREMITY [place];  Surgeon: [name], MD;  Location: [facility];  Service: Cardiovascular;  Laterality: Bilateral;  . BRAIN SURGERY    . BREAST SURGERY Bilateral 2014   1980 R breast surgery  . CEREBRAL ANEURYSM REPAIR    . TUBAL LIGATION  1990    Current Medications: Current Meds  Medication Sig  . aspirin EC 325 MG EC tablet Take 1 tablet (325 mg total) by mouth daily.  Marland Kitchen atorvastatin (LIPITOR) 80 MG tablet Take 1 tablet (80 mg total) by mouth daily at 6 PM.  . hydrALAZINE (APRESOLINE) 50 MG tablet Take 25 mg by mouth 3 (three) times daily.   . hydrochlorothiazide (HYDRODIURIL) 25 MG tablet Take 25 mg by mouth daily.     Allergies:   Amoxicillin and Penicillins    Social History   Socioeconomic History  . Marital status: Divorced    Spouse name: Not on file  . Number of children: Not on file  . Years of education: Not on file  . Highest education level: Not on file  Occupational History  . Not on file  Tobacco Use  . Smoking status: Current Every Day Smoker    Packs/day: 0.25    Types: Cigarettes  . Smokeless tobacco: Never Used  Vaping Use  . Vaping Use: Never used  Substance and Sexual Activity  . Alcohol use: No  . Drug use: No  . Sexual activity: Not Currently  Other Topics Concern  . Not on file  Social History Narrative  . Not on file   Social Determinants of Health   Financial Resource Strain:   . Difficulty of Paying Living Expenses: Not on file  Food Insecurity:   . Worried About Charity fundraiser in the Last Year: Not on file  . Ran Out of Food in the Last Year: Not on file  Transportation Needs:   . Lack of Transportation (Medical): Not on file  . Lack of Transportation (Non-Medical): Not on file  Physical Activity:   . Days of Exercise per Week: Not on file  . Minutes of Exercise per Session: Not on file  Stress:   . Feeling of Stress : Not on file  Social Connections:   . Frequency of  Communication with Friends and Family: Not on file  . Frequency of Social Gatherings with Friends and Family: Not on file  . Attends Religious Services: Not on file  . Active Member of Clubs or Organizations: Not on file  . Attends Archivist Meetings: Not on file  . Marital Status: Not on file     Family History: The patient's family history includes Heart Problems in her brother, brother, and father.  ROS:   Review of Systems  Constitution: Negative for decreased appetite, fever and weight gain.  HENT: Negative for congestion, ear discharge, hoarse voice and sore throat.   Eyes: Negative for discharge, redness, vision loss in right eye and visual halos.  Cardiovascular: Negative for chest pain, dyspnea on  exertion, leg swelling, orthopnea and palpitations.  Respiratory: Negative for cough, hemoptysis, shortness of breath and snoring.   Endocrine: Negative for heat intolerance and polyphagia.  Hematologic/Lymphatic: Negative for bleeding problem. Does not bruise/bleed easily.  Skin: Negative for flushing, nail changes, rash and suspicious lesions.  Musculoskeletal: Negative for arthritis, joint pain, muscle cramps, myalgias, neck pain and stiffness.  Gastrointestinal: Negative for abdominal pain, bowel incontinence, diarrhea and excessive appetite.  Genitourinary: Negative for decreased libido, genital sores and incomplete emptying.  Neurological: Negative for brief paralysis, focal weakness, headaches and loss of balance.  Psychiatric/Behavioral: Negative for altered mental status, depression and suicidal ideas.  Allergic/Immunologic: Negative for HIV exposure and persistent infections.    EKGs/Labs/Other Studies Reviewed:    The following studies were reviewed today:   EKG:  The ekg ordered today demonstrates   CCTA Aorta: Normal size. Mild aortic root calcifications. No dissection.  Aortic Valve:  Trileaflet.  No calcifications.  Coronary Arteries:  Normal coronary origin.  Right dominance.  RCA is a large dominant artery that gives rise to PDA and PLVB.There is minimal luminal irregularities in the proximal. Stair stepartifact in the mid RCA. Mid and distal RCA not well visualized. Left main is a large artery that gives rise to LAD and LCX arteries.  LAD is a large vessel. Minimal (<24%) calcified plaque in the proximal LAD. Minimal (<24%) soft plaque in the mid LAD. The distal LAD with no plaques. The is mild myocardial bridging in the mid LAD. D1 and D2 with minimal luminal irregularities.  LCX is a non-dominant artery that gives rise to one large OM1 branch. There is minimal (<24%) soft plaque in the mid LCX.  Other findings:  Normal pulmonary vein drainage into  the left atrium.  Normal left atrial appendage without a thrombus.  Normal size of the pulmonary artery.  IMPRESSION: 1. Coronary calcium score of 13. This was 51 percentile for age and sex matched control.  2. Normal coronary origin with right dominance.  3. Minimal CAD. CADRADs 1. Recommend medical therapy for primary prevention. This study will be send for FFR due the RCA not well visualized in the mid and distal portion for any flow limiting lesions.  FFRct FINDINGS:  1. Left Main: 0.96  2. LAD: Proximal 0.96,  Mid 0.92, Distal 0.86  3. LCX: Proximal 0.92,  Mid 0.78, Distal 0.78  4. RCA: Proximal 0.97,  Mid 0.88, distal 0.82  IMPRESSION: The FFRct result as list above with LCX 0.78 in the mid and distal portion. This is a small vessel, recommend optimizing medical therapy prior to consideration of further testing with cardiac catheterization.  Note: These examples are not recommendations of Heart[employer] and onlyprovided as examples of what other customers are doing.  Recent Labs: 09/11/2019: ALT 25; BUN 12; Creatinine, Ser 0.74; Hemoglobin 13.5; Platelets 301; Potassium 3.1; Sodium 135  Recent Lipid Panel    Component Value Date/Time   CHOL 178 08/24/2019 0142   TRIG 65 08/24/2019 0142   HDL 38 (L) 08/24/2019 0142   CHOLHDL 4.7 08/24/2019 0142   VLDL 13 08/24/2019 0142   LDLCALC 127 (H) 08/24/2019 0142    Physical Exam:    VS:  BP (!) 122/52   Pulse 77   Ht 5\' 6"  (1.676 m)   Wt 120 lb 3.2 oz (54.5 kg)   SpO2 96%   BMI 19.40 kg/m     Wt Readings from Last 3 Encounters:  10/03/19 120 lb 3.2 oz (54.5 kg)  09/11/19 123 lb 3 oz (55.9 kg)  09/05/19 120 lb (54.4 kg)     GEN: Well nourished, well developed in no acute distress HEENT: Normal NECK: No JVD; No carotid bruits LYMPHATICS: No lymphadenopathy CARDIAC: S1S2 noted,RRR, no murmurs, rubs, gallops RESPIRATORY:  Clear to auscultation without rales, wheezing or rhonchi  ABDOMEN: Soft,  non-tender, non-distended, +bowel sounds, no guarding. EXTREMITIES: No edema, No cyanosis, no clubbing MUSCULOSKELETAL:  No deformity  SKIN: Warm and dry NEUROLOGIC:  Alert and oriented x 3, non-focal PSYCHIATRIC:  Normal affect, good insight  ASSESSMENT:    1. Coronary artery disease involving native coronary artery of native heart without angina pectoris   2. PAD (peripheral artery disease) ([place])   3. Essential hypertension   4. Mixed hyperlipidemia   5. Tobacco use    PLAN:     1. We discussed her testing results today. She had no questions. She is here with the in person interpreter Shanon Brow). I was able to explain to her the findings. And the implications of her findings on the coronary CTA. She should be able to proceed safely with her procedure. She is already on aspirin and statin.  Her blood pressure deceptively in the office no changes will be made.  Smoking cessation advised.  The patient is in agreement with the above plan. The patient left the office in stable condition.  The patient will follow up in 3 months which will be post procedure.   Medication Adjustments/Labs and Tests Ordered: Current medicines are reviewed at length with the patient today.  Concerns regarding medicines are outlined above.  No orders of the defined types were placed in this encounter.  No orders of the defined types were placed in this encounter.   Patient Instructions  Medication Instructions:  Your physician recommends that you continue on your current medications as directed. Please refer to the Current Medication list given to you today.  *If you need a refill on your cardiac medications before your next appointment, please call your pharmacy*   Lab Work: None ordered   If you have labs (blood work) drawn today and your tests are completely normal, you will receive your results only by: Marland Kitchen MyChart Message (if you have MyChart) OR . A paper copy in the mail If you have any lab  test that is abnormal or we need to change your treatment, we will call you to review the results.   Testing/Procedures: None ordered    Follow-Up: At [facility], you and your health needs are our priority.  As part of our continuing mission to provide you with exceptional heart care, we have created designated Provider Care Teams.  These Care Teams include your primary Cardiologist (physician) and Advanced Practice Providers (APPs -  Physician  Assistants and Nurse Practitioners) who all work together to provide you with the care you need, when you need it.  We recommend signing up for the patient portal called "MyChart".  Sign up information is provided on this After Visit Summary.  MyChart is used to connect with patients for Virtual Visits (Telemedicine).  Patients are able to view lab/test results, encounter notes, upcoming appointments, etc.  Non-urgent messages can be sent to your provider as well.   To learn more about what you can do with MyChart, go to [email or web address].    Your next appointment:   3 month(s)  The format for your next appointment:   In Person  Provider:   Berniece Salines, DO   Other Instructions None      Adopting a Healthy Lifestyle.  Know what a healthy weight is for you (roughly BMI <25) and aim to maintain this   Aim for 7+ servings of fruits and vegetables daily   65-80+ fluid ounces of water or unsweet tea for healthy kidneys   Limit to max 1 drink of alcohol per day; avoid smoking/tobacco   Limit animal fats in diet for cholesterol and heart health - choose grass fed whenever available   Avoid highly processed foods, and foods high in saturated/trans fats   Aim for low stress - take time to unwind and care for your mental health   Aim for 150 min of moderate intensity exercise weekly for heart health, and weights twice weekly for bone health   Aim for 7-9 hours of sleep daily   When it comes to diets, agreement about the perfect  plan isnt easy to find, even among the experts. Experts at the [employer] developed an idea known as the Healthy Eating Plate. Just imagine a plate divided into logical, healthy portions.   The emphasis is on diet quality:   Load up on vegetables and fruits - one-half of your plate: Aim for color and variety, and remember that potatoes dont count.   Go for whole grains - one-quarter of your plate: Whole wheat, barley, wheat berries, quinoa, oats, brown rice, and foods made with them. If you want pasta, go with whole wheat pasta.   Protein power - one-quarter of your plate: Fish, chicken, beans, and nuts are all healthy, versatile protein sources. Limit red meat.   The diet, however, does go beyond the plate, offering a few other suggestions.   Use healthy plant oils, such as olive, canola, soy, corn, sunflower and peanut. Check the labels, and avoid partially hydrogenated oil, which have unhealthy trans fats.   If youre thirsty, drink water. Coffee and tea are good in moderation, but skip sugary drinks and limit milk and dairy products to one or two daily servings.   The type of carbohydrate in the diet is more important than the amount. Some sources of carbohydrates, such as vegetables, fruits, whole grains, and beans-are healthier than others.   Finally, stay active  Signed, Berniece Salines, DO  10/03/2019 4:04 PM    [facility]

## 2019-10-03 NOTE — Telephone Encounter (Signed)
Spoke with patient and patients daughter regarding results and recommendation.  Patient verbalizes understanding and is agreeable to plan of care. Advised patient to call back with any issues or concerns.

## 2019-10-04 ENCOUNTER — Telehealth: Payer: Self-pay

## 2019-10-04 NOTE — Telephone Encounter (Signed)
° °   [facility] Pre-operative Risk Assessment    HEARTCARE STAFF: - Please ensure there is not already an duplicate clearance open for this procedure. - Under Visit Info/Reason for Call, type in Other and utilize the format Clearance MM/DD/YY or Clearance TBD. Do not use dashes or single digits. - If request is for dental extraction, please clarify the # of teeth to be extracted.  Request for surgical clearance:  1. What type of surgery is being performed? Axillary Bifemoralvs. Aortobifemoral bypass   2. When is this surgery scheduled? TBD   3. What type of clearance is required (medical clearance vs. Pharmacy clearance to hold med vs. Both)? Medical  4. Are there any medications that need to be held prior to surgery and how long?   5. Practice name and name of physician performing surgery? [facility]- Dr. Ruta Hinds   6. What is the office phone number? [phone]   7.   What is the office fax number? [phone]  8.   Anesthesia type (None, local, MAC, general) ?    Lowella Grip 10/04/2019, 2:36 PM  _________________________________________________________________   (provider comments below)

## 2019-10-04 NOTE — Telephone Encounter (Signed)
   Primary Cardiologist: Berniece Salines, DO  Chart reviewed as part of pre-operative protocol coverage. Given past medical history and time since last visit, based on ACC/AHA guidelines, Katherine Archer would be at acceptable risk for the planned procedure without further cardiovascular testing.   I will route this recommendation to the requesting party via Epic fax function and remove from pre-op pool.  Please call with questions.  Jossie Ng. Yohanna Tow NP-C    10/04/2019, 3:00 PM [name] Office [phone] Fax [phone]

## 2019-10-10 ENCOUNTER — Other Ambulatory Visit: Payer: Self-pay | Admitting: *Deleted

## 2019-10-10 ENCOUNTER — Encounter: Payer: Self-pay | Admitting: *Deleted

## 2019-10-11 DIAGNOSIS — R519 Headache, unspecified: Secondary | ICD-10-CM | POA: Diagnosis not present

## 2019-10-11 DIAGNOSIS — J069 Acute upper respiratory infection, unspecified: Secondary | ICD-10-CM | POA: Diagnosis not present

## 2019-10-11 DIAGNOSIS — Z20822 Contact with and (suspected) exposure to covid-19: Secondary | ICD-10-CM | POA: Diagnosis not present

## 2019-10-11 DIAGNOSIS — J029 Acute pharyngitis, unspecified: Secondary | ICD-10-CM | POA: Diagnosis not present

## 2019-10-16 ENCOUNTER — Ambulatory Visit (INDEPENDENT_AMBULATORY_CARE_PROVIDER_SITE_OTHER): Payer: Medicare HMO | Admitting: Adult Health

## 2019-10-16 ENCOUNTER — Encounter: Payer: Self-pay | Admitting: Adult Health

## 2019-10-16 ENCOUNTER — Other Ambulatory Visit: Payer: Self-pay

## 2019-10-16 VITALS — BP 160/64 | HR 67 | Ht 66.0 in | Wt 116.0 lb

## 2019-10-16 DIAGNOSIS — I69354 Hemiplegia and hemiparesis following cerebral infarction affecting left non-dominant side: Secondary | ICD-10-CM

## 2019-10-16 DIAGNOSIS — I639 Cerebral infarction, unspecified: Secondary | ICD-10-CM

## 2019-10-16 DIAGNOSIS — I671 Cerebral aneurysm, nonruptured: Secondary | ICD-10-CM | POA: Diagnosis not present

## 2019-10-16 DIAGNOSIS — I6503 Occlusion and stenosis of bilateral vertebral arteries: Secondary | ICD-10-CM | POA: Diagnosis not present

## 2019-10-16 DIAGNOSIS — E785 Hyperlipidemia, unspecified: Secondary | ICD-10-CM | POA: Diagnosis not present

## 2019-10-16 DIAGNOSIS — I10 Essential (primary) hypertension: Secondary | ICD-10-CM | POA: Diagnosis not present

## 2019-10-16 MED ORDER — ASPIRIN 325 MG PO TBEC
325.0000 mg | DELAYED_RELEASE_TABLET | Freq: Every day | ORAL | 3 refills | Status: DC
Start: 2019-10-16 — End: 2019-11-12

## 2019-10-16 NOTE — Patient Instructions (Signed)
Recommend starting physical and occupational therapy at our neuro rehab. You will be called to schedule initial evaluations  Continue aspirin 325 mg daily  and atorvastatin 80mg  daily  for secondary stroke prevention  Continue to follow with Dr. Oneida Alar and Dr. Estanislado Pandy for surveillance monitoring   Continue to follow up with PCP regarding cholesterol and blood pressure management  Maintain strict control of hypertension with blood pressure goal below 130/90 and cholesterol with LDL cholesterol (bad cholesterol) goal below 70 mg/dL.      Followup in the future with me in 3 months or call earlier if needed       Thank you for coming to see [place] at [facility]. I hope we have been able to provide you high quality care today.  You may receive a patient satisfaction survey over the next few weeks. We would appreciate your feedback and comments so that we may continue to improve ourselves and the health of our patients.

## 2019-10-16 NOTE — Progress Notes (Signed)
I agree with the above plan 
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
## 2019-11-03 DIAGNOSIS — I10 Essential (primary) hypertension: Secondary | ICD-10-CM | POA: Diagnosis not present

## 2019-11-07 NOTE — Pre-Procedure Instructions (Signed)
[id number] [address], [place] [address] Phone: [phone] Fax: [phone]     Your procedure is scheduled on Monday November 8th.  Report to [facility] Main Entrance "A" at 5:30 A.M., and check in at the Admitting office.  Call this number if you have problems the morning of surgery:  [phone]  Call [phone] if you have any questions prior to your surgery date Monday-Friday 8am-4pm    Remember:  Do not eat or drink after midnight the night before your surgery     Take these medicines the morning of surgery with A SIP OF WATER  hydrALAZINE (APRESOLINE)  Follow your surgeon's instructions on when to stop Asprin.  If no instructions were given by your surgeon then you will need to call the office to get those instructions.     As of today, STOP taking any Aleve, Naproxen, Ibuprofen, Motrin, Advil, Goody's, BC's, all herbal medications, fish oil, and all vitamins.                      Do not wear jewelry, make up, or nail polish            Do not wear lotions, powders, perfumes/colognes, or deodorant.            Do not shave 48 hours prior to surgery.  Men may shave face and neck.            Do not bring valuables to the hospital.            [facility] is not responsible for any belongings or valuables.  Do NOT Smoke (Tobacco/Vaping) or drink Alcohol 24 hours prior to your procedure If you use a CPAP at night, you may bring all equipment for your overnight stay.   Contacts, glasses, dentures or bridgework may not be worn into surgery.      For patients admitted to the hospital, discharge time will be determined by your treatment team.   Patients discharged the day of surgery will not be allowed to drive home, and someone needs to stay with them for 24 hours.    Special instructions:   [place]- Preparing For Surgery  Before surgery, you can play an important role. Because skin is not sterile, your  skin needs to be as free of germs as possible. You can reduce the number of germs on your skin by washing with CHG (chlorahexidine gluconate) Soap before surgery.  CHG is an antiseptic cleaner which kills germs and bonds with the skin to continue killing germs even after washing.    Oral Hygiene is also important to reduce your risk of infection.  Remember - BRUSH YOUR TEETH THE MORNING OF SURGERY WITH YOUR REGULAR TOOTHPASTE  Please do not use if you have an allergy to CHG or antibacterial soaps. If your skin becomes reddened/irritated stop using the CHG.  Do not shave (including legs and underarms) for at least 48 hours prior to first CHG shower. It is OK to shave your face.  Please follow these instructions carefully.   1. Shower the NIGHT BEFORE SURGERY and the MORNING OF SURGERY with CHG Soap.   2. If you chose to wash your hair, wash your hair first as usual with your normal shampoo.  3. After you shampoo, rinse your hair and body thoroughly to remove the shampoo.  4. Use CHG as you would any other liquid soap. You can apply  CHG directly to the skin and wash gently with a scrungie or a clean washcloth.   5. Apply the CHG Soap to your body ONLY FROM THE NECK DOWN.  Do not use on open wounds or open sores. Avoid contact with your eyes, ears, mouth and genitals (private parts). Wash Face and genitals (private parts)  with your normal soap.   6. Wash thoroughly, paying special attention to the area where your surgery will be performed.  7. Thoroughly rinse your body with warm water from the neck down.  8. DO NOT shower/wash with your normal soap after using and rinsing off the CHG Soap.  9. Pat yourself dry with a CLEAN TOWEL.  10. Wear CLEAN PAJAMAS to bed the night before surgery  11. Place CLEAN SHEETS on your bed the night of your first shower and DO NOT SLEEP WITH PETS.   Day of Surgery: Wear Clean/Comfortable clothing the morning of surgery Do not apply any  deodorants/lotions.   Remember to brush your teeth WITH YOUR REGULAR TOOTHPASTE.   Please read over the following fact sheets that you were given.

## 2019-11-08 ENCOUNTER — Other Ambulatory Visit (HOSPITAL_COMMUNITY): Payer: Medicare HMO

## 2019-11-08 ENCOUNTER — Encounter (HOSPITAL_COMMUNITY)
Admission: RE | Admit: 2019-11-08 | Discharge: 2019-11-08 | Disposition: A | Discharge status: Home / Self Care | Payer: Medicare HMO | Source: Ambulatory Visit | Attending: Vascular Surgery | Admitting: Vascular Surgery

## 2019-11-08 ENCOUNTER — Encounter (HOSPITAL_COMMUNITY): Payer: Self-pay

## 2019-11-08 ENCOUNTER — Other Ambulatory Visit: Payer: Self-pay

## 2019-11-08 ENCOUNTER — Other Ambulatory Visit (HOSPITAL_COMMUNITY)
Admission: RE | Admit: 2019-11-08 | Discharge: 2019-11-08 | Disposition: A | Discharge status: Home / Self Care | Payer: Medicare HMO | Source: Ambulatory Visit | Attending: Vascular Surgery | Admitting: Vascular Surgery

## 2019-11-08 DIAGNOSIS — Z20822 Contact with and (suspected) exposure to covid-19: Secondary | ICD-10-CM | POA: Insufficient documentation

## 2019-11-08 DIAGNOSIS — Z01812 Encounter for preprocedural laboratory examination: Secondary | ICD-10-CM | POA: Insufficient documentation

## 2019-11-08 DIAGNOSIS — Z01818 Encounter for other preprocedural examination: Secondary | ICD-10-CM | POA: Insufficient documentation

## 2019-11-08 DIAGNOSIS — Z7901 Long term (current) use of anticoagulants: Secondary | ICD-10-CM | POA: Insufficient documentation

## 2019-11-08 LAB — COMPREHENSIVE METABOLIC PANEL
ALT: 19 U/L (ref 0–44)
AST: 19 U/L (ref 15–41)
Albumin: 4.1 g/dL (ref 3.5–5.0)
Alkaline Phosphatase: 72 U/L (ref 38–126)
Anion gap: 10 (ref 5–15)
BUN: 12 mg/dL (ref 8–23)
CO2: 23 mmol/L (ref 22–32)
Calcium: 9.3 mg/dL (ref 8.9–10.3)
Chloride: 107 mmol/L (ref 98–111)
Creatinine, Ser: 0.82 mg/dL (ref 0.44–1.00)
GFR, Estimated: >60 mL/min (ref >60)
Glucose, Bld: 112 mg/dL — ABNORMAL HIGH (ref 70–99)
Potassium: 3.7 mmol/L (ref 3.5–5.1)
Sodium: 140 mmol/L (ref 135–145)
Total Bilirubin: 0.5 mg/dL (ref 0.3–1.2)
Total Protein: 7.3 g/dL (ref 6.5–8.1)

## 2019-11-08 LAB — URINALYSIS, ROUTINE W REFLEX MICROSCOPIC
Bilirubin Urine: NEGATIVE
Glucose, UA: NEGATIVE mg/dL
Hgb urine dipstick: NEGATIVE
Ketones, ur: NEGATIVE mg/dL
Leukocytes,Ua: NEGATIVE
Nitrite: NEGATIVE
Protein, ur: NEGATIVE mg/dL
Specific Gravity, Urine: 1.008 (ref 1.005–1.030)
pH: 6 (ref 5.0–8.0)

## 2019-11-08 LAB — TYPE AND SCREEN
ABO/RH(D): O POS
Antibody Screen: NEGATIVE

## 2019-11-08 LAB — SURGICAL PCR SCREEN
MRSA, PCR: NEGATIVE
Staphylococcus aureus: NEGATIVE

## 2019-11-08 LAB — CBC
HCT: 40.4 % (ref 36.0–46.0)
Hemoglobin: 12.6 g/dL (ref 12.0–15.0)
MCH: 30.8 pg (ref 26.0–34.0)
MCHC: 31.2 g/dL (ref 30.0–36.0)
MCV: 98.8 fL (ref 80.0–100.0)
Platelets: 241 10*3/uL (ref 150–400)
RBC: 4.09 MIL/uL (ref 3.87–5.11)
RDW: 12.7 % (ref 11.5–15.5)
WBC: 5.6 10*3/uL (ref 4.0–10.5)
nRBC: 0 % (ref 0.0–0.2)

## 2019-11-08 LAB — PROTIME-INR
INR: 1 (ref 0.8–1.2)
Prothrombin Time: 12.5 seconds (ref 11.4–15.2)

## 2019-11-08 LAB — APTT: aPTT: 26 seconds (ref 24–36)

## 2019-11-08 LAB — SARS CORONAVIRUS 2 (TAT 6-24 HRS): SARS Coronavirus 2: NEGATIVE

## 2019-11-08 NOTE — Progress Notes (Signed)
PCP:  [name], MD Cardiologist: Berniece Salines?  Patient does not remember  EKG: 09/02/19 CXR:  N/A ECHO:  08/24/19 Stress Test:  Denies Cardiac Cath:  Denies  Fasting Blood Sugar-  N/A Checks Blood Sugar___ times a day  Patient stopped ASA 11/07/19  Covid test 11/08/19  Anesthesia Review:  No.  Patient states she saw cards but it was related to PVD.  Patient denies shortness of breath, fever, cough, and chest pain at PAT appointment.  Patient verbalized understanding of instructions provided today at the PAT appointment.  Patient asked to review instructions at home and day of surgery.

## 2019-11-11 ENCOUNTER — Encounter (HOSPITAL_COMMUNITY)
Admission: RE | Disposition: A | Discharge status: Home / Self Care | Payer: Self-pay | Source: Ambulatory Visit | Attending: Vascular Surgery

## 2019-11-11 ENCOUNTER — Inpatient Hospital Stay (HOSPITAL_COMMUNITY): Payer: Medicare HMO | Admitting: Anesthesiology

## 2019-11-11 ENCOUNTER — Inpatient Hospital Stay (HOSPITAL_COMMUNITY)
Admission: RE | Admit: 2019-11-11 | Discharge: 2019-11-14 | DRG: 253 | Disposition: A | Discharge status: Home / Self Care | Payer: Medicare HMO | Attending: Vascular Surgery | Admitting: Vascular Surgery

## 2019-11-11 ENCOUNTER — Other Ambulatory Visit: Payer: Self-pay

## 2019-11-11 ENCOUNTER — Encounter (HOSPITAL_COMMUNITY): Payer: Self-pay | Admitting: Vascular Surgery

## 2019-11-11 DIAGNOSIS — I69954 Hemiplegia and hemiparesis following unspecified cerebrovascular disease affecting left non-dominant side: Secondary | ICD-10-CM | POA: Diagnosis not present

## 2019-11-11 DIAGNOSIS — M79671 Pain in right foot: Secondary | ICD-10-CM | POA: Diagnosis not present

## 2019-11-11 DIAGNOSIS — I2510 Atherosclerotic heart disease of native coronary artery without angina pectoris: Secondary | ICD-10-CM | POA: Diagnosis not present

## 2019-11-11 DIAGNOSIS — R9431 Abnormal electrocardiogram [ECG] [EKG]: Secondary | ICD-10-CM | POA: Diagnosis not present

## 2019-11-11 DIAGNOSIS — I10 Essential (primary) hypertension: Secondary | ICD-10-CM | POA: Diagnosis not present

## 2019-11-11 DIAGNOSIS — I160 Hypertensive urgency: Secondary | ICD-10-CM

## 2019-11-11 DIAGNOSIS — E785 Hyperlipidemia, unspecified: Secondary | ICD-10-CM | POA: Diagnosis present

## 2019-11-11 DIAGNOSIS — Z7982 Long term (current) use of aspirin: Secondary | ICD-10-CM | POA: Diagnosis not present

## 2019-11-11 DIAGNOSIS — Z7902 Long term (current) use of antithrombotics/antiplatelets: Secondary | ICD-10-CM

## 2019-11-11 DIAGNOSIS — Z8679 Personal history of other diseases of the circulatory system: Secondary | ICD-10-CM | POA: Diagnosis not present

## 2019-11-11 DIAGNOSIS — Z87891 Personal history of nicotine dependence: Secondary | ICD-10-CM

## 2019-11-11 DIAGNOSIS — I119 Hypertensive heart disease without heart failure: Secondary | ICD-10-CM | POA: Diagnosis present

## 2019-11-11 DIAGNOSIS — D62 Acute posthemorrhagic anemia: Secondary | ICD-10-CM | POA: Diagnosis not present

## 2019-11-11 DIAGNOSIS — I70223 Atherosclerosis of native arteries of extremities with rest pain, bilateral legs: Secondary | ICD-10-CM | POA: Diagnosis not present

## 2019-11-11 DIAGNOSIS — I739 Peripheral vascular disease, unspecified: Secondary | ICD-10-CM | POA: Diagnosis present

## 2019-11-11 DIAGNOSIS — Z20822 Contact with and (suspected) exposure to covid-19: Secondary | ICD-10-CM | POA: Diagnosis present

## 2019-11-11 DIAGNOSIS — Z79899 Other long term (current) drug therapy: Secondary | ICD-10-CM | POA: Diagnosis not present

## 2019-11-11 DIAGNOSIS — I4581 Long QT syndrome: Secondary | ICD-10-CM | POA: Diagnosis not present

## 2019-11-11 DIAGNOSIS — Z880 Allergy status to penicillin: Secondary | ICD-10-CM | POA: Diagnosis not present

## 2019-11-11 DIAGNOSIS — E782 Mixed hyperlipidemia: Secondary | ICD-10-CM | POA: Diagnosis not present

## 2019-11-11 HISTORY — PX: ENDARTERECTOMY FEMORAL: SHX5804

## 2019-11-11 HISTORY — PX: AXILLARY-FEMORAL BYPASS GRAFT: SHX894

## 2019-11-11 LAB — POCT ACTIVATED CLOTTING TIME
Activated Clotting Time: 246 seconds
Activated Clotting Time: 268 seconds
Activated Clotting Time: 241 seconds
Activated Clotting Time: 301 seconds

## 2019-11-11 SURGERY — CREATION, BYPASS, ARTERIAL, AXILLARY TO BILATERAL FEMORAL, USING GRAFT
Anesthesia: General | Site: Groin | Laterality: Right

## 2019-11-11 MED ORDER — METOPROLOL TARTRATE 5 MG/5ML IV SOLN: 2.0000 mg | INTRAVENOUS | Status: DC | PRN

## 2019-11-11 MED ORDER — ONDANSETRON HCL 4 MG/2ML IJ SOLN
INTRAMUSCULAR | Status: AC
  Filled 2019-11-11: qty 2

## 2019-11-11 MED ORDER — OXYCODONE-ACETAMINOPHEN 5-325 MG PO TABS
1.0000 | ORAL_TABLET | ORAL | Status: DC | PRN
  Administered 2019-11-11: 2 via ORAL
  Administered 2019-11-12: 1 via ORAL
  Administered 2019-11-12 – 2019-11-14 (×4): 2 via ORAL
  Filled 2019-11-11: qty 2
  Filled 2019-11-11: qty 1
  Filled 2019-11-11 (×4): qty 2

## 2019-11-11 MED ORDER — HYDRALAZINE HCL 20 MG/ML IJ SOLN: 5.0000 mg | INTRAMUSCULAR | Status: DC | PRN

## 2019-11-11 MED ORDER — OXYCODONE HCL 5 MG/5ML PO SOLN: 5.0000 mg | Freq: Once | ORAL | Status: AC | PRN

## 2019-11-11 MED ORDER — ONDANSETRON HCL 4 MG/2ML IJ SOLN: 4.0000 mg | Freq: Four times a day (QID) | INTRAMUSCULAR | Status: DC | PRN

## 2019-11-11 MED ORDER — HYDRALAZINE HCL 25 MG PO TABS: 25.0000 mg | ORAL_TABLET | Freq: Three times a day (TID) | ORAL | Status: DC

## 2019-11-11 MED ORDER — LABETALOL HCL 5 MG/ML IV SOLN
INTRAVENOUS | Status: AC
  Filled 2019-11-11: qty 4

## 2019-11-11 MED ORDER — HEPARIN SODIUM (PORCINE) 1000 UNIT/ML IJ SOLN
INTRAMUSCULAR | Status: DC | PRN
  Administered 2019-11-11 (×2): 7000 [IU] via INTRAVENOUS
  Administered 2019-11-11: 3000 [IU] via INTRAVENOUS

## 2019-11-11 MED ORDER — LABETALOL HCL 5 MG/ML IV SOLN: 10.0000 mg | INTRAVENOUS | Status: DC | PRN

## 2019-11-11 MED ORDER — SODIUM CHLORIDE 0.9 % IV SOLN
INTRAVENOUS | Status: AC
  Filled 2019-11-11: qty 1.2

## 2019-11-11 MED ORDER — SODIUM CHLORIDE 0.9 % IV SOLN: INTRAVENOUS | Status: DC

## 2019-11-11 MED ORDER — PHENYLEPHRINE HCL-NACL 10-0.9 MG/250ML-% IV SOLN
INTRAVENOUS | Status: DC | PRN
  Administered 2019-11-11: 25 ug/min via INTRAVENOUS

## 2019-11-11 MED ORDER — ACETAMINOPHEN 325 MG PO TABS: 325.0000 mg | ORAL_TABLET | ORAL | Status: DC | PRN

## 2019-11-11 MED ORDER — HYDRALAZINE HCL 50 MG PO TABS
50.0000 mg | ORAL_TABLET | Freq: Three times a day (TID) | ORAL | Status: DC
  Administered 2019-11-11: 50 mg via ORAL
  Filled 2019-11-11: qty 1

## 2019-11-11 MED ORDER — PROPOFOL 10 MG/ML IV BOLUS
INTRAVENOUS | Status: AC
  Filled 2019-11-11: qty 40

## 2019-11-11 MED ORDER — HYDRALAZINE HCL 20 MG/ML IJ SOLN
INTRAMUSCULAR | Status: AC
  Administered 2019-11-11: 5 mg via INTRAVENOUS
  Filled 2019-11-11: qty 1

## 2019-11-11 MED ORDER — CLEVIDIPINE BUTYRATE 0.5 MG/ML IV EMUL
1.0000 mg/h | INTRAVENOUS | Status: AC
  Filled 2019-11-11: qty 50

## 2019-11-11 MED ORDER — DEXAMETHASONE SODIUM PHOSPHATE 10 MG/ML IJ SOLN
INTRAMUSCULAR | Status: AC
  Filled 2019-11-11: qty 1

## 2019-11-11 MED ORDER — OXYCODONE HCL 5 MG PO TABS
ORAL_TABLET | ORAL | Status: AC
  Administered 2019-11-11: 5 mg via ORAL
  Filled 2019-11-11: qty 1

## 2019-11-11 MED ORDER — CHLORHEXIDINE GLUCONATE 0.12 % MT SOLN
OROMUCOSAL | Status: AC
  Administered 2019-11-11: 15 mL via OROMUCOSAL
  Filled 2019-11-11: qty 15

## 2019-11-11 MED ORDER — ROCURONIUM BROMIDE 10 MG/ML (PF) SYRINGE
PREFILLED_SYRINGE | INTRAVENOUS | Status: AC
  Filled 2019-11-11: qty 10

## 2019-11-11 MED ORDER — ALUM & MAG HYDROXIDE-SIMETH 200-200-20 MG/5ML PO SUSP: 15.0000 mL | ORAL | Status: DC | PRN

## 2019-11-11 MED ORDER — MORPHINE SULFATE (PF) 2 MG/ML IV SOLN
INTRAVENOUS | Status: AC
  Administered 2019-11-11: 2 mg via INTRAVENOUS
  Filled 2019-11-11: qty 1

## 2019-11-11 MED ORDER — LABETALOL HCL 5 MG/ML IV SOLN
INTRAVENOUS | Status: DC | PRN
  Administered 2019-11-11: 10 mg via INTRAVENOUS

## 2019-11-11 MED ORDER — HYDROCHLOROTHIAZIDE 25 MG PO TABS: 25.0000 mg | ORAL_TABLET | Freq: Every day | ORAL | Status: DC

## 2019-11-11 MED ORDER — CHLORHEXIDINE GLUCONATE CLOTH 2 % EX PADS: 6.0000 | MEDICATED_PAD | Freq: Once | CUTANEOUS | Status: DC

## 2019-11-11 MED ORDER — MORPHINE SULFATE (PF) 2 MG/ML IV SOLN
2.0000 mg | INTRAVENOUS | Status: DC | PRN
  Administered 2019-11-12 (×3): 2 mg via INTRAVENOUS
  Filled 2019-11-11 (×2): qty 1

## 2019-11-11 MED ORDER — HYDRALAZINE HCL 50 MG PO TABS: 50.0000 mg | ORAL_TABLET | Freq: Three times a day (TID) | ORAL | Status: DC

## 2019-11-11 MED ORDER — ONDANSETRON HCL 4 MG/2ML IJ SOLN
INTRAMUSCULAR | Status: DC | PRN
  Administered 2019-11-11: 4 mg via INTRAVENOUS

## 2019-11-11 MED ORDER — LIDOCAINE 2% (20 MG/ML) 5 ML SYRINGE
INTRAMUSCULAR | Status: AC
  Filled 2019-11-11: qty 5

## 2019-11-11 MED ORDER — PROPOFOL 10 MG/ML IV BOLUS
INTRAVENOUS | Status: DC | PRN
  Administered 2019-11-11: 100 mg via INTRAVENOUS

## 2019-11-11 MED ORDER — FENTANYL CITRATE (PF) 100 MCG/2ML IJ SOLN
25.0000 ug | INTRAMUSCULAR | Status: DC | PRN
  Administered 2019-11-11: 50 ug via INTRAVENOUS
  Administered 2019-11-11: 25 ug via INTRAVENOUS

## 2019-11-11 MED ORDER — LACTATED RINGERS IV SOLN: INTRAVENOUS | Status: DC

## 2019-11-11 MED ORDER — HYDRALAZINE HCL 20 MG/ML IJ SOLN
INTRAMUSCULAR | Status: DC | PRN
  Administered 2019-11-11: 10 mg via INTRAVENOUS

## 2019-11-11 MED ORDER — VANCOMYCIN HCL IN DEXTROSE 1-5 GM/200ML-% IV SOLN
1000.0000 mg | Freq: Two times a day (BID) | INTRAVENOUS | Status: AC
  Administered 2019-11-11: 1000 mg via INTRAVENOUS
  Filled 2019-11-11: qty 200

## 2019-11-11 MED ORDER — CHLORHEXIDINE GLUCONATE 0.12 % MT SOLN: 15.0000 mL | Freq: Once | OROMUCOSAL | Status: AC

## 2019-11-11 MED ORDER — ROCURONIUM BROMIDE 10 MG/ML (PF) SYRINGE
PREFILLED_SYRINGE | INTRAVENOUS | Status: DC | PRN
  Administered 2019-11-11: 30 mg via INTRAVENOUS
  Administered 2019-11-11: 70 mg via INTRAVENOUS
  Administered 2019-11-11: 20 mg via INTRAVENOUS

## 2019-11-11 MED ORDER — HEPARIN SODIUM (PORCINE) 1000 UNIT/ML IJ SOLN
INTRAMUSCULAR | Status: AC
  Filled 2019-11-11: qty 1

## 2019-11-11 MED ORDER — LIDOCAINE 2% (20 MG/ML) 5 ML SYRINGE
INTRAMUSCULAR | Status: DC | PRN
  Administered 2019-11-11: 20 mg via INTRAVENOUS

## 2019-11-11 MED ORDER — CHLORHEXIDINE GLUCONATE CLOTH 2 % EX PADS
6.0000 | MEDICATED_PAD | Freq: Every day | CUTANEOUS | Status: DC
  Administered 2019-11-11 – 2019-11-13 (×3): 6 via TOPICAL

## 2019-11-11 MED ORDER — HEPARIN SODIUM (PORCINE) 5000 UNIT/ML IJ SOLN
5000.0000 [IU] | Freq: Three times a day (TID) | INTRAMUSCULAR | Status: DC
  Administered 2019-11-12 – 2019-11-14 (×7): 5000 [IU] via SUBCUTANEOUS
  Filled 2019-11-11 (×7): qty 1

## 2019-11-11 MED ORDER — SODIUM CHLORIDE 0.9 % IV SOLN: INTRAVENOUS | Status: AC

## 2019-11-11 MED ORDER — MIDAZOLAM HCL 2 MG/2ML IJ SOLN
INTRAMUSCULAR | Status: DC | PRN
  Administered 2019-11-11: 2 mg via INTRAVENOUS

## 2019-11-11 MED ORDER — PROTAMINE SULFATE 10 MG/ML IV SOLN
INTRAVENOUS | Status: DC | PRN
  Administered 2019-11-11: 50 mg via INTRAVENOUS

## 2019-11-11 MED ORDER — FENTANYL CITRATE (PF) 250 MCG/5ML IJ SOLN
INTRAMUSCULAR | Status: AC
  Filled 2019-11-11: qty 5

## 2019-11-11 MED ORDER — PANTOPRAZOLE SODIUM 40 MG PO TBEC
40.0000 mg | DELAYED_RELEASE_TABLET | Freq: Every day | ORAL | Status: DC
  Administered 2019-11-11 – 2019-11-14 (×4): 40 mg via ORAL
  Filled 2019-11-11 (×4): qty 1

## 2019-11-11 MED ORDER — ACETAMINOPHEN 325 MG RE SUPP
325.0000 mg | RECTAL | Status: DC | PRN
  Filled 2019-11-11: qty 2

## 2019-11-11 MED ORDER — PHENOL 1.4 % MT LIQD: 1.0000 | OROMUCOSAL | Status: DC | PRN

## 2019-11-11 MED ORDER — LACTATED RINGERS IV SOLN: INTRAVENOUS | Status: DC | PRN

## 2019-11-11 MED ORDER — PHENYLEPHRINE 40 MCG/ML (10ML) SYRINGE FOR IV PUSH (FOR BLOOD PRESSURE SUPPORT)
PREFILLED_SYRINGE | INTRAVENOUS | Status: DC | PRN
  Administered 2019-11-11 (×3): 80 ug via INTRAVENOUS

## 2019-11-11 MED ORDER — DOCUSATE SODIUM 100 MG PO CAPS
100.0000 mg | ORAL_CAPSULE | Freq: Every day | ORAL | Status: DC
  Administered 2019-11-12 – 2019-11-14 (×3): 100 mg via ORAL
  Filled 2019-11-11 (×3): qty 1

## 2019-11-11 MED ORDER — MIDAZOLAM HCL 2 MG/2ML IJ SOLN
INTRAMUSCULAR | Status: AC
  Filled 2019-11-11: qty 2

## 2019-11-11 MED ORDER — SODIUM CHLORIDE 0.9 % IV SOLN
INTRAVENOUS | Status: DC | PRN
  Administered 2019-11-11: 500 mL

## 2019-11-11 MED ORDER — OXYCODONE HCL 5 MG PO TABS: 5.0000 mg | ORAL_TABLET | Freq: Once | ORAL | Status: AC | PRN

## 2019-11-11 MED ORDER — ATORVASTATIN CALCIUM 80 MG PO TABS
80.0000 mg | ORAL_TABLET | Freq: Every day | ORAL | Status: DC
  Administered 2019-11-12 – 2019-11-13 (×2): 80 mg via ORAL
  Filled 2019-11-11 (×2): qty 1

## 2019-11-11 MED ORDER — GUAIFENESIN-DM 100-10 MG/5ML PO SYRP: 15.0000 mL | ORAL_SOLUTION | ORAL | Status: DC | PRN

## 2019-11-11 MED ORDER — MAGNESIUM SULFATE 2 GM/50ML IV SOLN: 2.0000 g | Freq: Every day | INTRAVENOUS | Status: DC | PRN

## 2019-11-11 MED ORDER — HYDRALAZINE HCL 20 MG/ML IJ SOLN
INTRAMUSCULAR | Status: AC
  Filled 2019-11-11: qty 1

## 2019-11-11 MED ORDER — FENTANYL CITRATE (PF) 100 MCG/2ML IJ SOLN
INTRAMUSCULAR | Status: AC
  Administered 2019-11-11: 25 ug via INTRAVENOUS
  Filled 2019-11-11: qty 2

## 2019-11-11 MED ORDER — POTASSIUM CHLORIDE CRYS ER 20 MEQ PO TBCR: 20.0000 meq | EXTENDED_RELEASE_TABLET | Freq: Every day | ORAL | Status: DC | PRN

## 2019-11-11 MED ORDER — DEXAMETHASONE SODIUM PHOSPHATE 10 MG/ML IJ SOLN
INTRAMUSCULAR | Status: DC | PRN
  Administered 2019-11-11: 10 mg via INTRAVENOUS

## 2019-11-11 MED ORDER — FENTANYL CITRATE (PF) 100 MCG/2ML IJ SOLN
INTRAMUSCULAR | Status: AC
  Administered 2019-11-11: 50 ug via INTRAVENOUS
  Filled 2019-11-11: qty 2

## 2019-11-11 MED ORDER — HYDROCHLOROTHIAZIDE 25 MG PO TABS
25.0000 mg | ORAL_TABLET | Freq: Every day | ORAL | Status: DC
  Administered 2019-11-12 – 2019-11-14 (×3): 25 mg via ORAL
  Filled 2019-11-11 (×3): qty 1

## 2019-11-11 MED ORDER — SODIUM CHLORIDE 0.9 % IV SOLN: 500.0000 mL | Freq: Once | INTRAVENOUS | Status: DC | PRN

## 2019-11-11 MED ORDER — 0.9 % SODIUM CHLORIDE (POUR BTL) OPTIME
TOPICAL | Status: DC | PRN
  Administered 2019-11-11 (×2): 1000 mL

## 2019-11-11 MED ORDER — ORAL CARE MOUTH RINSE: 15.0000 mL | Freq: Once | OROMUCOSAL | Status: AC

## 2019-11-11 MED ORDER — VANCOMYCIN HCL IN DEXTROSE 1-5 GM/200ML-% IV SOLN
1000.0000 mg | INTRAVENOUS | Status: AC
  Administered 2019-11-11: 1000 mg via INTRAVENOUS
  Filled 2019-11-11: qty 200

## 2019-11-11 MED ORDER — ASPIRIN EC 81 MG PO TBEC
81.0000 mg | DELAYED_RELEASE_TABLET | Freq: Every day | ORAL | Status: DC
  Administered 2019-11-11 – 2019-11-14 (×4): 81 mg via ORAL
  Filled 2019-11-11 (×4): qty 1

## 2019-11-11 MED ORDER — SUGAMMADEX SODIUM 200 MG/2ML IV SOLN
INTRAVENOUS | Status: DC | PRN
  Administered 2019-11-11: 150 mg via INTRAVENOUS

## 2019-11-11 MED ORDER — FENTANYL CITRATE (PF) 250 MCG/5ML IJ SOLN
INTRAMUSCULAR | Status: DC | PRN
  Administered 2019-11-11 (×2): 50 ug via INTRAVENOUS
  Administered 2019-11-11: 25 ug via INTRAVENOUS
  Administered 2019-11-11: 75 ug via INTRAVENOUS
  Administered 2019-11-11: 50 ug via INTRAVENOUS
  Administered 2019-11-11: 100 ug via INTRAVENOUS

## 2019-11-11 SURGICAL SUPPLY — 47 items
BAG ISOLATION DRAPE 18X18 (DRAPES) ×2 IMPLANT
CANISTER SUCT 3000ML PPV (MISCELLANEOUS) ×3 IMPLANT
CANNULA VESSEL 3MM 2 BLNT TIP (CANNULA) ×6 IMPLANT
CLIP VESOCCLUDE MED 24/CT (CLIP) ×3 IMPLANT
CLIP VESOCCLUDE SM WIDE 24/CT (CLIP) ×3 IMPLANT
COVER WAND RF STERILE (DRAPES) IMPLANT
DERMABOND ADVANCED (GAUZE/BANDAGES/DRESSINGS) ×3
DERMABOND ADVANCED .7 DNX12 (GAUZE/BANDAGES/DRESSINGS) ×6 IMPLANT
DRAIN SNY 10X20 3/4 PERF (WOUND CARE) IMPLANT
DRAPE INCISE IOBAN 66X45 STRL (DRAPES) ×6 IMPLANT
DRAPE ISOLATION BAG 18X18 (DRAPES) ×1
ELECT REM PT RETURN 9FT ADLT (ELECTROSURGICAL) ×3
ELECTRODE REM PT RTRN 9FT ADLT (ELECTROSURGICAL) ×2 IMPLANT
EVACUATOR SILICONE 100CC (DRAIN) IMPLANT
GAUZE 4X4 16PLY RFD (DISPOSABLE) ×3 IMPLANT
GLOVE BIO SURGEON STRL SZ7.5 (GLOVE) ×9 IMPLANT
GLOVE BIOGEL PI IND STRL 6.5 (GLOVE) ×4 IMPLANT
GLOVE BIOGEL PI IND STRL 9 (GLOVE) ×2 IMPLANT
GLOVE BIOGEL PI INDICATOR 6.5 (GLOVE) ×2
GLOVE BIOGEL PI INDICATOR 9 (GLOVE) ×1
GOWN STRL REUS W/ TWL LRG LVL3 (GOWN DISPOSABLE) ×6 IMPLANT
GOWN STRL REUS W/TWL LRG LVL3 (GOWN DISPOSABLE) ×3
GRAFT CV 60X8STRG TUBE KNTD (Vascular Products) ×2 IMPLANT
GRAFT HEMASHIELD 8MM (Vascular Products) ×2 IMPLANT
GRAFT VASC STRG 30X8KNIT (Vascular Products) ×2 IMPLANT
HEMOSTAT SPONGE AVITENE ULTRA (HEMOSTASIS) IMPLANT
KIT BASIN OR (CUSTOM PROCEDURE TRAY) ×3 IMPLANT
KIT TURNOVER KIT B (KITS) ×3 IMPLANT
LOOP VESSEL MINI RED (MISCELLANEOUS) ×6 IMPLANT
NS IRRIG 1000ML POUR BTL (IV SOLUTION) ×6 IMPLANT
PACK PERIPHERAL VASCULAR (CUSTOM PROCEDURE TRAY) ×3 IMPLANT
PAD ARMBOARD 7.5X6 YLW CONV (MISCELLANEOUS) ×6 IMPLANT
STAPLER VISISTAT 35W (STAPLE) IMPLANT
SUT PROLENE 5 0 C 1 24 (SUTURE) ×6 IMPLANT
SUT PROLENE 6 0 CC (SUTURE) ×24 IMPLANT
SUT SILK 2 0 PERMA HAND 18 BK (SUTURE) ×3 IMPLANT
SUT SILK 3 0 (SUTURE) ×1
SUT SILK 3-0 18XBRD TIE 12 (SUTURE) ×2 IMPLANT
SUT VIC AB 2-0 SH 27 (SUTURE) ×3
SUT VIC AB 2-0 SH 27XBRD (SUTURE) ×6 IMPLANT
SUT VIC AB 3-0 SH 27 (SUTURE) ×3
SUT VIC AB 3-0 SH 27X BRD (SUTURE) ×6 IMPLANT
SUT VICRYL 4-0 PS2 18IN ABS (SUTURE) ×9 IMPLANT
TAPE UMBILICAL COTTON 1/8X30 (MISCELLANEOUS) ×3 IMPLANT
TOWEL GREEN STERILE (TOWEL DISPOSABLE) ×3 IMPLANT
TRAY FOLEY MTR SLVR 16FR STAT (SET/KITS/TRAYS/PACK) ×3 IMPLANT
WATER STERILE IRR 1000ML POUR (IV SOLUTION) ×3 IMPLANT

## 2019-11-11 NOTE — Anesthesia Procedure Notes (Signed)
Arterial Line Insertion Start/End11/08/2019 7:05 AM, 11/11/2019 7:15 AM Performed by: Thelma Comp, CRNA, CRNA  Patient location: Pre-op. Preanesthetic checklist: patient identified, IV checked, site marked, risks and benefits discussed, surgical consent, monitors and equipment checked, pre-op evaluation, timeout performed and anesthesia consent Lidocaine 1% used for infiltration Left, radial was placed Catheter size: 20 Fr Hand hygiene performed , maximum sterile barriers used  and Seldinger technique used  Attempts: 1 Procedure performed without using ultrasound guided technique. Following insertion, dressing applied and Biopatch. Post procedure assessment: normal and unchanged  Patient tolerated the procedure well with no immediate complications.

## 2019-11-11 NOTE — Anesthesia Procedure Notes (Addendum)
Procedure Name: Intubation Date/Time: 11/11/2019 7:50 AM Performed by: Thelma Comp, CRNA Pre-anesthesia Checklist: Patient identified, Emergency Drugs available, Suction available and Patient being monitored Patient Re-evaluated:Patient Re-evaluated prior to induction Oxygen Delivery Method: Circle System Utilized Preoxygenation: Pre-oxygenation with 100% oxygen Induction Type: IV induction Ventilation: Mask ventilation without difficulty Laryngoscope Size: Mac and 3 Grade View: Grade I Tube type: Oral Tube size: 7.0 mm Number of attempts: 1 Airway Equipment and Method: Stylet and Oral airway Placement Confirmation: ETT inserted through vocal cords under direct vision,  positive ETCO2 and breath sounds checked- equal and bilateral Secured at: 21 cm Tube secured with: Tape Dental Injury: Teeth and Oropharynx as per pre-operative assessment

## 2019-11-11 NOTE — Consult Note (Addendum)
Cardiology Consultation:   Patient ID: Katherine Archer MRN: [id number]; DOB: 07-Apr-1955  Admit date: 11/11/2019 Date of Consult: 11/11/2019  Primary Care Provider: [name], MD [facility] Cardiologist: Berniece Salines, DO  [facility] Electrophysiologist:  None    Patient Profile:   Katherine Archer is a 64 y.o. female with a hx of carotid disease, CAD on coronary CTA, HTN, HLD, hx of CVA, + tobacco use who is being seen today for the evaluation of HTN post axillary bifem at the request of Dr. Oneida Alar.  History of Present Illness:   Katherine Archer with recent cardiac CTA calcium score 13, and minimal CAD recommended medical therapy for primary prevention--LCX 0.78 in the mid and distal portion. This is a small vessel, recommend optimizing medical therapy prior to consideration of further testing with cardiac catheterization.  Echo 08/24/19 with EF 70-75% hyperdynamic LV function. Moderate concentric LVH, G1DD.  moderately elevated PA systolic pressure.  BP in office 122/52.   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 Estanislado Pandy for further planning. Per neuro BP goal <130/90  given severe vasculopathy.  With abd aortogram had embolic CVA 04/2351  Katherine Archer presented today for Right axillary bifemoral bypasswith bilateral common femoral endarterectomytoday.   Katherine Archer BP this AM was 197/51 then 178/59  Post up 145/54 P 70s. Afebrile  No EKG  Have ordered EKG:  The EKG was personally reviewed and demonstrates:  ordered Telemetry:  Telemetry was personally reviewed and demonstrates:  SR   Has rec'd 5 mg IV apresoline  Art line is much greater than cuff pressure BP now 140s to 150s systoic.   No chest pains and no SOB only complains of leg pain.       Past Medical History:  Diagnosis Date  . Cervical polyp 09/18/2018  . Encounter for screening  mammogram for breast cancer 05/05/2016  . Fibrocystic breast changes of both breasts 04/29/2015  . Hyperlipidemia   . Hypertension   . PAD (peripheral artery disease) ([place]) 08/23/2019  . Stroke [facility]) 08/23/2019    Past Surgical History:  Procedure Laterality Date  . ABDOMINAL AORTOGRAM W/LOWER EXTREMITY Bilateral 08/23/2019   Procedure: ABDOMINAL AORTOGRAM W/ Bilateral LOWER EXTREMITY Runoff;  Surgeon: [name], MD;  Location: [place] CV [facility];  Service: Cardiovascular;  Laterality: Bilateral;  . BRAIN SURGERY    . BREAST SURGERY Bilateral 2014   1980 R breast surgery  . CEREBRAL ANEURYSM REPAIR    . TUBAL LIGATION  1990     Home Medications:  Prior to Admission medications   Medication Sig Start Date End Date Taking? Authorizing Provider  aspirin 81 MG EC tablet Take 81 mg by mouth daily.   Yes [provider]  atorvastatin (LIPITOR) 80 MG tablet Take 1 tablet (80 mg total) by mouth daily at 6 PM. 08/27/19  Yes Dagoberto Ligas, PA-C  hydrALAZINE (APRESOLINE) 50 MG tablet Take 50 mg by mouth 3 (three) times daily.  08/16/19  Yes [provider]  hydrochlorothiazide (HYDRODIURIL) 25 MG tablet Take 25 mg by mouth daily. 09/13/19  Yes [provider]  aspirin 325 MG EC tablet Take 1 tablet (325 mg total) by mouth daily. Patient not taking: Reported on 11/05/2019 10/16/19   Frann Rider, NP    Inpatient Medications: Scheduled Meds:  Continuous Infusions: . sodium chloride    .  sodium chloride    . clevidipine     PRN Meds: sodium chloride, fentaNYL (SUBLIMAZE) injection, hydrALAZINE, labetalol, ondansetron (ZOFRAN) IV, oxyCODONE **OR** oxyCODONE, oxyCODONE-acetaminophen  Allergies:    Allergies  Allergen Reactions  . Amoxicillin Other (See Comments)    Skin infection on face  . Penicillins Other (See Comments)    Shaky and Dizziness 20 years ago    Social History:   Social History   Socioeconomic History  . Marital status: Divorced     Spouse name: Not on file  . Number of children: Not on file  . Years of education: Not on file  . Highest education level: Not on file  Occupational History  . Not on file  Tobacco Use  . Smoking status: Former Smoker    Packs/day: 0.25    Types: Cigarettes    Quit date: 10/04/2019    Years since quitting: 0.1  . Smokeless tobacco: Never Used  Vaping Use  . Vaping Use: Never used  Substance and Sexual Activity  . Alcohol use: No  . Drug use: No  . Sexual activity: Not Currently  Other Topics Concern  . Not on file  Social History Narrative  . Not on file   Social Determinants of Health   Financial Resource Strain:   . Difficulty of Paying Living Expenses: Not on file  Food Insecurity:   . Worried About Charity fundraiser in the Last Year: Not on file  . Ran Out of Food in the Last Year: Not on file  Transportation Needs:   . Lack of Transportation (Medical): Not on file  . Lack of Transportation (Non-Medical): Not on file  Physical Activity:   . Days of Exercise per Week: Not on file  . Minutes of Exercise per Session: Not on file  Stress:   . Feeling of Stress : Not on file  Social Connections:   . Frequency of Communication with Friends and Family: Not on file  . Frequency of Social Gatherings with Friends and Family: Not on file  . Attends Religious Services: Not on file  . Active Member of Clubs or Organizations: Not on file  . Attends Archivist Meetings: Not on file  . Marital Status: Not on file  Intimate Partner Violence:   . Fear of Current or Ex-Partner: Not on file  . Emotionally Abused: Not on file  . Physically Abused: Not on file  . Sexually Abused: Not on file    Family History:    Family History  Problem Relation Age of Onset  . Heart Problems Father   . Heart Problems Brother   . Heart Problems Brother      ROS:  Please see the history of present illness.  General:no colds or fevers, no weight changes Skin:no rashes or  ulcers HEENT:no blurred vision, no congestion CV:see HPI PUL:see HPI GI:no diarrhea constipation or melena, no indigestion GU:no hematuria, no dysuria MS:no joint pain, + claudication with rest pain in Rt foot.  Neuro:no syncope, no lightheadedness Endo:no diabetes, no thyroid disease  All other ROS reviewed and negative.     Physical Exam/Data:   Vitals:   11/11/19 0556 11/11/19 0622 11/11/19 0624 11/11/19 1255  BP: (!) 197/51  (!) 178/59   Pulse: 63     Resp: 18     Temp: 98 F (36.7 C)   (!) 97.4 F (36.3 C)  TempSrc: Oral     SpO2: 100%     Weight:  54.9 kg  Height:  5\' 6"  (1.676 m)      Intake/Output Summary (Last 24 hours) at 11/11/2019 1454 Last data filed at 11/11/2019 1243 Gross per 24 hour  Intake 2000 ml  Output 350 ml  Net 1650 ml   Last 3 Weights 11/11/2019 11/08/2019 10/16/2019  Weight (lbs) 121 lb 121 lb 116 lb  Weight (kg) 54.885 kg 54.885 kg 52.617 kg     Body mass index is 19.53 kg/m.  General:  Well nourished, well developed, in no acute distress wakes easily when name called, only pain is leg pain.   HEENT: normal Lymph: no adenopathy Neck: no JVD Endocrine:  No thryomegaly Vascular: No carotid bruits; FA pulses 2+ bilaterally without bruits  Cardiac:  normal S1, S2; RRR; no murmur gallup rub or click unable to palpate pulses.   Lungs:  clear to auscultation bilaterally, no wheezing, rhonchi or rales  Abd: soft, nontender, no hepatomegaly  Ext: no edema bil groin pain from surgery Musculoskeletal:  No deformities, BUE and BLE strength normal and equal Skin: warm and dry  Neuro:  Alert and oriented X 3 MAE follows [employer], no focal abnormalities noted Psych:  Normal affect   Relevant CV Studies: Echo 08/24/19  IMPRESSIONS    1. Left ventricular ejection fraction, by estimation, is 70 to 75%. The  left ventricle has hyperdynamic function. The left ventricle has no  regional wall motion abnormalities. There is moderate concentric left   ventricular hypertrophy. Left ventricular  diastolic parameters are consistent with Grade I diastolic dysfunction  (impaired relaxation). Elevated left atrial pressure.  2. Right ventricular systolic function is normal. The right ventricular  size is normal. There is moderately elevated pulmonary artery systolic  pressure.  3. The mitral valve is normal in structure. Mild mitral valve  regurgitation. No evidence of mitral stenosis.  4. The aortic valve is normal in structure. Aortic valve regurgitation is  not visualized. No aortic stenosis is present.  5. The inferior vena cava is dilated in size with <50% respiratory  variability, suggesting right atrial pressure of 15 mmHg.   Conclusion(s)/Recommendation(s): No intracardiac source of embolism  detected on this transthoracic study. A transesophageal echocardiogram is  recommended to exclude cardiac source of embolism if clinically indicated.   FINDINGS  Left Ventricle: Left ventricular ejection fraction, by estimation, is 70  to 75%. The left ventricle has hyperdynamic function. The left ventricle  has no regional wall motion abnormalities. The left ventricular internal  cavity size was normal in size.  There is moderate concentric left ventricular hypertrophy. Left  ventricular diastolic parameters are consistent with Grade I diastolic  dysfunction (impaired relaxation). Elevated left atrial pressure.   Right Ventricle: The right ventricular size is normal. No increase in  right ventricular wall thickness. Right ventricular systolic function is  normal. There is moderately elevated pulmonary artery systolic pressure.  The tricuspid regurgitant velocity is  2.80 m/s, and with an assumed right atrial pressure of 15 mmHg, the  estimated right ventricular systolic pressure is 29.7 mmHg.   Left Atrium: Left atrial size was normal in size.   Right Atrium: Right atrial size was normal in size.   Pericardium: There is no  evidence of pericardial effusion.   Mitral Valve: The mitral valve is normal in structure. Normal mobility of  the mitral valve leaflets. Mild mitral valve regurgitation. No evidence of  mitral valve stenosis. MV peak gradient, 5.7 mmHg. The mean mitral valve  gradient is 1.0 mmHg.   Tricuspid Valve: The tricuspid  valve is normal in structure. Tricuspid  valve regurgitation is mild . No evidence of tricuspid stenosis.   Aortic Valve: The aortic valve is normal in structure. Aortic valve  regurgitation is not visualized. No aortic stenosis is present. Aortic  valve mean gradient measures 5.0 mmHg. Aortic valve peak gradient measures  11.6 mmHg. Aortic valve area, by VTI  measures 2.00 cm.   Pulmonic Valve: The pulmonic valve was normal in structure. Pulmonic valve  regurgitation is not visualized. No evidence of pulmonic stenosis.   Aorta: The aortic root is normal in size and structure.   Venous: The inferior vena cava is dilated in size with less than 50%  respiratory variability, suggesting right atrial pressure of 15 mmHg.   IAS/Shunts: No atrial level shunt detected by color flow Doppler.     Laboratory Data:  High Sensitivity Troponin:  No results for input(s): TROPONINIHS in the last 720 hours.   Chemistry Recent Labs  Lab 11/08/19 0858  NA 140  K 3.7  CL 107  CO2 23  GLUCOSE 112*  BUN 12  CREATININE 0.82  CALCIUM 9.3  GFRNONAA >60  ANIONGAP 10    Recent Labs  Lab 11/08/19 0858  PROT 7.3  ALBUMIN 4.1  AST 19  ALT 19  ALKPHOS 72  BILITOT 0.5   Hematology Recent Labs  Lab 11/08/19 0858  WBC 5.6  RBC 4.09  HGB 12.6  HCT 40.4  MCV 98.8  MCH 30.8  MCHC 31.2  RDW 12.7  PLT 241   BNPNo results for input(s): BNP, PROBNP in the last 168 hours.  DDimer No results for input(s): DDIMER in the last 168 hours.   Radiology/Studies:  No results found.   Assessment and Plan:   1. Not well controlled HTN - on hydralazine 50 TID and HCTZ 25 mg daily  with BP elevation at times.  Today BP elevated on arrival for surgery now with IV hydralazine BP with improved control.   Will add back oral hydralazine and HCTZ .   2. PAD with axillary bifem bypass.  With claudication + post op pain  3. CAD mild on cardiac CTA. No angina 4. Hx of embolic stroke in 01/4429 post procedure. Also hx of ICH in 1998 with clipping of aneurysm, with recurrent aneurysm adjacent to prior aneurysm 7-8 mm saccular aneurysm, no TPA given in August.   This may be treated in future.  5. Tobacco use. Understands Katherine Archer should stop smoking.  6. HLD on statin  7. QTc on EKG in August , awaiting EKG ordered today 8.   For questions or updates, please contact [place] Please consult [email or web address] for contact info under    Signed, Cecilie Kicks, NP  11/11/2019 2:54 PM   History and all data above reviewed.  Patient examined.  I agree with the findings as above.   The interview was obtained with a Optometrist.  Katherine Archer is in pain in the right greater than left foot.  Katherine Archer does not report chest pain or SOB.   We are called because of hypertension.  Katherine Archer had an arterial like with BPs as high as 212/60.  However, there was significant artifact on the tracing and BPs via bilateral BP cuffs are in the 540G systolic. Katherine Archer has had some IV hydralazine and will be able to take PO.  Katherine Archer did miss AM PO meds.   The patient exam reveals COR:RRR . Soft apical mild systolic murmur, no diastolic murmurs,  Lungs: Clear  ,  Abd:  Positive bowel sounds, no rebound no guarding, Ext Mild edema with decreased DP/PT (audible on Doppler)  .  All available labs, radiology testing, previous records reviewed. Agree with documented assessment and plan.    HTN:  Driven in part by pain.  Likely will have reasonable control with control of pain and restarting Katherine Archer previous 50 mg tid of hydralazine and HCTZ.  We can supplement with IV hydralazine as needed.   Minus Breeding  4:32 PM  11/11/2019

## 2019-11-11 NOTE — Anesthesia Preprocedure Evaluation (Signed)
Anesthesia Evaluation  Patient identified by MRN, date of birth, ID band Patient awake    Reviewed: Allergy & Precautions, H&P , NPO status , Patient's Chart, lab work & pertinent test results  Airway Mallampati: II   Neck ROM: full    Dental   Pulmonary former smoker,    breath sounds clear to auscultation       Cardiovascular hypertension, + Peripheral Vascular Disease   Rhythm:regular Rate:Normal     Neuro/Psych CVA    GI/Hepatic   Endo/Other    Renal/GU      Musculoskeletal   Abdominal   Peds  Hematology   Anesthesia Other Findings   Reproductive/Obstetrics                             Anesthesia Physical Anesthesia Plan  ASA: III  Anesthesia Plan: General   Post-op Pain Management:    Induction: Intravenous  PONV Risk Score and Plan: 3 and Ondansetron, Dexamethasone, Midazolam and Treatment may vary due to age or medical condition  Airway Management Planned: Oral ETT  Additional Equipment:   Intra-op Plan:   Post-operative Plan: Extubation in OR  Informed Consent: I have reviewed the patients History and Physical, chart, labs and discussed the procedure including the risks, benefits and alternatives for the proposed anesthesia with the patient or authorized representative who has indicated his/her understanding and acceptance.       Plan Discussed with: CRNA, Anesthesiologist and Surgeon  Anesthesia Plan Comments:         Anesthesia Quick Evaluation

## 2019-11-11 NOTE — Progress Notes (Signed)
Interpreter, Marianna Fuss, at bedside

## 2019-11-11 NOTE — H&P (Signed)
Patient is a 64 year old female who returns for follow-up today.  She recent underwent diagnostic arteriogram for planning for peripheral arterial procedure.  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  She still has some mild contracture of the left hand but this is recovering somewhat.  She is continuing physical therapy.  She feels she is ready for axillary bifemoral bypass grafting at this point.  She has rest pain in the right foot.  She currently does not have any open wounds.  She states the left foot is also getting worse.  She was recently evaluated by her pulmonary doctor in [place] and found to be a reasonable candidate for operation.  Her PFTs were also reasonable.  She is scheduled apparently for a coronary CT on September 8.  She does not currently have chest pain.        Current Outpatient Medications on File Prior to Visit  Medication Sig Dispense Refill  . aspirin EC 325 MG EC tablet Take 1 tablet (325 mg total) by mouth daily. 30 tablet 0  . atorvastatin (LIPITOR) 80 MG tablet Take 1 tablet (80 mg total) by mouth daily at 6 PM. 30 tablet 2  . clopidogrel (PLAVIX) 75 MG tablet Take 1 tablet (75 mg total) by mouth daily for 9 days. 9 tablet 0  . hydrALAZINE (APRESOLINE) 25 MG tablet Take 25 mg by mouth 3 (three) times daily.    . hydrochlorothiazide (HYDRODIURIL) 25 MG tablet Take 25 mg by mouth daily.    Marland Kitchen ibuprofen (ADVIL) 200 MG tablet Take 200 mg by mouth every 6 (six) hours as needed.    . metoprolol tartrate (LOPRESSOR) 100 MG tablet Take 1 tablet (100 mg total) by mouth once for 1 dose. Take 2 hours prior to  procedure 1 tablet 0   No current facility-administered medications on file prior to visit.         Past Medical History:  Diagnosis Date  . Hyperlipidemia   . Hypertension          Past Surgical History:  Procedure Laterality Date  . ABDOMINAL AORTOGRAM W/LOWER EXTREMITY Bilateral 08/23/2019    Procedure: ABDOMINAL AORTOGRAM W/ Bilateral LOWER EXTREMITY Runoff;  Surgeon: [name], MD;  Location: [place] [facility];  Service: Cardiovascular;  Laterality: Bilateral;  . BRAIN SURGERY    . CEREBRAL ANEURYSM REPAIR      Review of systems: She has no chest pain currently.  She gets short of breath with exertion.  She has not had any new neurologic events since her recent stroke.  Physical exam:  Vitals:   11/11/19 0556 11/11/19 0622 11/11/19 0624  BP: (!) 197/51  (!) 178/59  Pulse: 63    Resp: 18    Temp: 98 F (36.7 C)    TempSrc: Oral    SpO2: 100%    Weight:  54.9 kg   Height:  5\' 6"  (1.676 m)     Extremities: 2+ brachial radial pulse bilaterally, absent femoral pedal pulses Skin: No open wounds or ulcer Neuro: Some flexion deformity of the left hand although she is able to open this some.  4/5 motor arm flexion and shoulder extension on the left side compared to her right which is 5/5, minimal left leg weakness  Data: I reviewed the patient's prior ABIs from August 15, 2019.  Right side was 0.2 left side 0.4  Assessment: Rest pain right foot early rest pain left foot at risk for  limb loss without revascularization.  Despite having recent stroke I believe she needs an intervention sooner rather than later to prevent her from limb loss or worsening symptoms in her right leg.  I discussed with the patient and her daughter today risk benefits possible complications of axillary bifemoral bypass grafting.  These include but are not limited to myocardial events 5% bleeding 1 to 2% infection 1% worsening of stroke 2 to 5%  Plan: Right axillary bifemoral bypass with bilateral common femoral endarterectomy today.  We will stop her Plavix today.  She will continue her aspirin perioperatively.  We will resume her Plavix postop.  Ruta Hinds, MD [facility] Office: [phone]

## 2019-11-11 NOTE — Plan of Care (Signed)

## 2019-11-11 NOTE — Transfer of Care (Signed)
Immediate Anesthesia Transfer of Care Note  Patient: Katherine Archer  Procedure(s) Performed: BYPASS GRAFT RIGHT AXILLA-BIFEMORAL (Right Groin) ENDARTERECTOMY FEMORAL BILATERALLY (Bilateral Groin)  Patient Location: PACU  Anesthesia Type:General  Level of Consciousness: drowsy and responds to stimulation  Airway & Oxygen Therapy: Patient Spontanous Breathing and Patient connected to face mask oxygen  Post-op Assessment: Report given to RN and Post -op Vital signs reviewed and stable  Post vital signs: Reviewed and stable  Last Vitals:  Vitals Value Taken Time  BP 113/61 11/11/19 1254  Temp    Pulse 64 11/11/19 1255  Resp 16 11/11/19 1255  SpO2 100 % 11/11/19 1255  Vitals shown include unvalidated device data.  Last Pain:  Vitals:   11/11/19 0622  TempSrc:   PainSc: 8       Patients Stated Pain Goal: 3 (90/22/84 0698)  Complications: No complications documented.

## 2019-11-11 NOTE — Op Note (Signed)
Procedure: Right axillary bifemoral bypass with bilateral common femoral endarterectomy  Preoperative diagnosis: Rest pain bilateral feet  Most operative diagnosis: Same  Anesthesia: General  Assistant: Jamelle Haring, MD, Arlee Muslim, PA-C to expedite procedure and creation of anastomosis  Operative findings: 8 mm Dacron graft end to side to axillary artery and 2 right and left common femoral artery  Operative details: After pain informed consent, the patient taken the operating.  The patient was placed in supine position operating table.  After induction general anesthesia and endotracheal intubation a Foley catheter was placed.  Next a longitudinal incision was made in the left groin carried down through the subcutaneous tissues down the level left common femoral artery.  This was dissected free circumferentially.  The anterior wall was fairly soft on palpation but there was a large amount of posterior plaque.  Vesseloops were placed around the superficial femoral profunda femoris and several circumflex iliac branches.  Common femoral artery was about 4-1/2 mm in diameter.  Next a similar incision was made in the right groin carried down through subcutaneous tissues down the level right common femoral artery.  There were fairly similar findings.  I did extend the incision in the right groin slightly lower due to a slightly lower takeoff of the femoral bifurcation.  Vesseloops were placed around the profunda superficial femoral, femoral artery as well as circumflex iliac branches.  Next a subcutaneous tunnel was created connecting the right to the left groin incision and an 8 mm Dacron graft was brought through this.  A right infraclavicular incision was made carried down through subcutaneous tissues and down to the pectoralis fascia.  The pectoralis minor was divided to allow better exposure of the axillary artery.  This was dissected free circumferentially and elevated up in the operative field.  It  was about 6 mm in diameter.  Several small side branches were ligated and divided tween silk ties.  A tunnel was then created from the axillary incision down to the right groin incision and an 8 mm Dacron graft brought through this.  Patient was given 7000 units of heparin.  She was given additional doses of heparin during the course of the case to maintain an ACT greater than 250.  Next a longitudinal opening was made in the right common femoral artery and carried down to the femoral bifurcation.  There was a large amount of calcific plaque obscuring a good amount of the lumen so I did a right femoral endarterectomy and carried this all the way down to the femoral bifurcation and got good endpoints in the profunda femoris and superficial femoral arteries.  The 8 mm Dacron graft was then beveled and sewn endograft to side of artery using a running 6-0 Prolene suture.  Just prior to completion anastomosis was backbled and forebled.  Graft was then pulled taut to length up the axillary incision.  The axillary artery was controlled proximally distally with Henley clamps.  Longitudinal opening was made in the axillary artery and the graft slightly beveled and sewn endograft to side of artery using a running 6-0 Prolene suture.  Despite completion anastomosis it was for blood backbled and thoroughly flushed reanastomosed was secured clamps released there was a lot of bleeding from posterior wall of the anastomosis it was noted that the suture line was slightly loose so this was tightened up in a repair stitch placed.  There was good hemostasis at this point.  There was good pulsatile flow through the graft to the right groin  and there was a good dorsalis pedis and posterior tibial Doppler signal in the right foot.  Next the femoral-femoral portion of the graft was created by clamping the Dacron graft from the axillary femoral bypass and a longitudinal opening made in the Dacron on its medial side and the additional 8 mm  Dacron graft sewn end of graft to side of dacryon using a running 5-0 Prolene suture.  Despite completion anastomosis it was for blood backbled and thoroughly flushed.  Estimates was secured clamps released there was good pulsatile flow in the femorofemoral graft as well as restoration of flow to the right groin.  Attention was then turned to the left common femoral artery.  This was controlled proximally distally with a vessel loop proximally and small bulldog clamps on the profunda and superficial femoral artery.  The artery was opened longitudinally and similar posterior plaque was obscuring a large amount of the lumen.  Endarterectomy was performed with a good proximal endpoint and distal endpoint in the profunda and superficial femoral arteries.  Graft was then sewn end of graft to side of artery using a running 6-0 Prolene suture.  This prior to completion anastomosis it was for blood backbled and thoroughly flushed reanastomosed was secured clamps released was pulsatile flow in the femoral artery immediately and a good dorsalis pedis and posterior tibial Doppler signal.  Patient was then given 50 mg of protamine to assist with hemostasis.  Direct pressure was held on the anastomosis ~0 these were all hemostatic.  Groins were then closed in multiple layers with running 2-0 and 3-0 Vicryl suture and 4-0 Vicryl subcuticular stitch in the skin.  Dermabond was applied.  Similar closure was performed after closing the fascia and the axillary incision subcutaneous tissues were reapproximated using running 3-0 Vicryl suture.  Skin was closed with 4-0 Vicryl subcuticular stitch and Dermabond applied.  Patient tolerated procedure well and there were no complications.  The instrument sponge and needle count was correct end of the case.  Patient was taken the recovery room in stable condition.  Ruta Hinds, MD [facility] Office: [phone]

## 2019-11-12 ENCOUNTER — Encounter (HOSPITAL_COMMUNITY): Payer: Self-pay | Admitting: Vascular Surgery

## 2019-11-12 ENCOUNTER — Inpatient Hospital Stay (HOSPITAL_COMMUNITY): Payer: Medicare HMO

## 2019-11-12 DIAGNOSIS — R9431 Abnormal electrocardiogram [ECG] [EKG]: Secondary | ICD-10-CM | POA: Diagnosis not present

## 2019-11-12 DIAGNOSIS — I4581 Long QT syndrome: Secondary | ICD-10-CM | POA: Diagnosis not present

## 2019-11-12 DIAGNOSIS — I10 Essential (primary) hypertension: Secondary | ICD-10-CM | POA: Diagnosis not present

## 2019-11-12 LAB — CBC
HCT: 30.6 % — ABNORMAL LOW (ref 36.0–46.0)
Hemoglobin: 9.7 g/dL — ABNORMAL LOW (ref 12.0–15.0)
MCH: 30.9 pg (ref 26.0–34.0)
MCHC: 31.7 g/dL (ref 30.0–36.0)
MCV: 97.5 fL (ref 80.0–100.0)
Platelets: 167 10*3/uL (ref 150–400)
RBC: 3.14 MIL/uL — ABNORMAL LOW (ref 3.87–5.11)
RDW: 13.1 % (ref 11.5–15.5)
WBC: 7.8 10*3/uL (ref 4.0–10.5)
nRBC: 0 % (ref 0.0–0.2)

## 2019-11-12 LAB — BASIC METABOLIC PANEL
Anion gap: 8 (ref 5–15)
BUN: 6 mg/dL — ABNORMAL LOW (ref 8–23)
CO2: 24 mmol/L (ref 22–32)
Calcium: 8.7 mg/dL — ABNORMAL LOW (ref 8.9–10.3)
Chloride: 109 mmol/L (ref 98–111)
Creatinine, Ser: 0.63 mg/dL (ref 0.44–1.00)
GFR, Estimated: >60 mL/min (ref >60)
Glucose, Bld: 118 mg/dL — ABNORMAL HIGH (ref 70–99)
Potassium: 3.8 mmol/L (ref 3.5–5.1)
Sodium: 141 mmol/L (ref 135–145)

## 2019-11-12 LAB — ECHOCARDIOGRAM COMPLETE
AR max vel: 3.23 cm2
AV Area VTI: 3.09 cm2
AV Area mean vel: 2.93 cm2
AV Mean grad: 7 mmHg
AV Peak grad: 12.7 mmHg
Ao pk vel: 1.78 m/s
Area-P 1/2: 4.15 cm2
Height: 66 in
S' Lateral: 2.5 cm
Weight: 1935.99 oz

## 2019-11-12 LAB — LIPID PANEL
Cholesterol: 118 mg/dL (ref 0–200)
HDL: 36 mg/dL — ABNORMAL LOW (ref >40)
LDL Cholesterol: 72 mg/dL (ref 0–99)
Total CHOL/HDL Ratio: 3.3 RATIO
Triglycerides: 49 mg/dL (ref <150)
VLDL: 10 mg/dL (ref 0–40)

## 2019-11-12 LAB — TROPONIN I (HIGH SENSITIVITY)
Troponin I (High Sensitivity): 7 ng/L (ref <18)
Troponin I (High Sensitivity): 10 ng/L (ref <18)

## 2019-11-12 MED ORDER — HYDRALAZINE HCL 25 MG PO TABS
25.0000 mg | ORAL_TABLET | Freq: Three times a day (TID) | ORAL | Status: DC
  Administered 2019-11-12 – 2019-11-14 (×7): 25 mg via ORAL
  Filled 2019-11-12 (×7): qty 1

## 2019-11-12 NOTE — Progress Notes (Signed)
  Echocardiogram 2D Echocardiogram has been performed.  Katherine Archer 11/12/2019, 5:32 PM

## 2019-11-12 NOTE — Anesthesia Postprocedure Evaluation (Signed)
Anesthesia Post Note  Patient: Deryn Massengale  Procedure(s) Performed: BYPASS GRAFT RIGHT AXILLA-BIFEMORAL (Right Groin) ENDARTERECTOMY FEMORAL BILATERALLY (Bilateral Groin)     Patient location during evaluation: PACU Anesthesia Type: General Level of consciousness: awake and alert Pain management: pain level controlled Vital Signs Assessment: post-procedure vital signs reviewed and stable Respiratory status: spontaneous breathing, nonlabored ventilation, respiratory function stable and patient connected to nasal cannula oxygen Cardiovascular status: blood pressure returned to baseline and stable Postop Assessment: no apparent nausea or vomiting Anesthetic complications: no   No complications documented.  Last Vitals:  Vitals:   11/12/19 0600 11/12/19 0700  BP: (!) 92/53 127/78  Pulse: (!) 55 (!) 59  Resp: 14 (!) 21  Temp:    SpO2: 97% 97%    Last Pain:  Vitals:   11/12/19 0538  TempSrc:   PainSc: [place]

## 2019-11-12 NOTE — Progress Notes (Addendum)
Progress Note  Patient Name: Katherine Archer Date of Encounter: 11/12/2019  Primary Cardiologist:   Berniece Salines, DO   Subjective   She reports that the pain is controlled.    Inpatient Medications    Scheduled Meds: . aspirin EC  81 mg Oral Daily  . atorvastatin  80 mg Oral q1800  . Chlorhexidine Gluconate Cloth  6 each Topical Daily  . docusate sodium  100 mg Oral Daily  . heparin  5,000 Units Subcutaneous Q8H  . hydrALAZINE  50 mg Oral TID  . hydrochlorothiazide  25 mg Oral Daily  . pantoprazole  40 mg Oral Daily   Continuous Infusions: . sodium chloride    . sodium chloride    . clevidipine    . magnesium sulfate bolus IVPB     PRN Meds: sodium chloride, acetaminophen **OR** acetaminophen, alum & mag hydroxide-simeth, guaiFENesin-dextromethorphan, hydrALAZINE, labetalol, magnesium sulfate bolus IVPB, metoprolol tartrate, morphine injection, ondansetron, oxyCODONE-acetaminophen, phenol, potassium chloride   Vital Signs    Vitals:   11/12/19 0400 11/12/19 0500 11/12/19 0600 11/12/19 0700  BP: (!) 113/55 126/60 (!) 92/53 127/78  Pulse: (!) 56 (!) 58 (!) 55 (!) 59  Resp: 14 17 14  (!) 21  Temp:      TempSrc:      SpO2: 98% 98% 97% 97%  Weight:      Height:        Intake/Output Summary (Last 24 hours) at 11/12/2019 0736 Last data filed at 11/12/2019 0700 Gross per 24 hour  Intake 2200 ml  Output 1955 ml  Net 245 ml   Filed Weights   11/11/19 0622  Weight: 54.9 kg    Telemetry    NSR - Personally Reviewed  ECG    NSR, rate 62, axis WNL, QT prolonged, deep T wave inversion in the anterolateral leads new from previous.  - Personally Reviewed  Physical Exam   GEN: No acute distress.   Neck: No  JVD Cardiac: RRR, soft systolic murmur, rubs, or gallops.  Respiratory: Clear  to auscultation bilaterally. GI: Soft, nontender, non-distended  MS:    Mild edema; No deformity. Neuro:  Nonfocal  Psych: Normal affect   Labs    Chemistry Recent Labs  Lab  11/08/19 0858 11/12/19 0046  NA 140 141  K 3.7 3.8  CL 107 109  CO2 23 24  GLUCOSE 112* 118*  BUN 12 6*  CREATININE 0.82 0.63  CALCIUM 9.3 8.7*  PROT 7.3  --   ALBUMIN 4.1  --   AST 19  --   ALT 19  --   ALKPHOS 72  --   BILITOT 0.5  --   GFRNONAA >60 >60  ANIONGAP 10 8     Hematology Recent Labs  Lab 11/08/19 0858 11/12/19 0046  WBC 5.6 7.8  RBC 4.09 3.14*  HGB 12.6 9.7*  HCT 40.4 30.6*  MCV 98.8 97.5  MCH 30.8 30.9  MCHC 31.2 31.7  RDW 12.7 13.1  PLT 241 167    Cardiac EnzymesNo results for input(s): TROPONINI in the last 168 hours. No results for input(s): TROPIPOC in the last 168 hours.   BNPNo results for input(s): BNP, PROBNP in the last 168 hours.   DDimer No results for input(s): DDIMER in the last 168 hours.   Radiology    No results found.  Cardiac Studies   NA  Patient Profile     64 y.o. female  with a hx of carotid disease, CAD on coronary CTA, HTN,  HLD, hx of CVA, + tobacco use who is being seen for the evaluation of HTN post axillary bifem at the request of Dr. Oneida Alar.  Assessment & Plan    HTN:    BP is controlled and actually low.   She received one dose of 50 mg hydralazine last night.  I will reduce this scheduled dose and put on hold parameters.  She did not get her hydrodiuril and I will stop this.    Labile blood pressure likely related to her pain level fluctuating with pain meds.   PROLONGED QT:  Repeated an EKG to look at her previously prolonged QT and suprisingly she has new diffuse anterolateral T wave inversion with continued prolonged QT.  I will check a trop and echo given this.  She has no symptoms consistent with ischemia.      For questions or updates, please contact [place] Please consult [email or web address] for contact info under Cardiology/STEMI.   Signed, Minus Breeding, MD  11/12/2019, 7:36 AM

## 2019-11-12 NOTE — Progress Notes (Addendum)
[facility]  Subjective  - not much pain   Objective 127/78 (!) 59 98.2 F (36.8 C) (Oral) (!) 21 97%  Intake/Output Summary (Last 24 hours) at 11/12/2019 0743 Last data filed at 11/12/2019 0700 Gross per 24 hour  Intake 2200 ml  Output 1955 ml  Net 245 ml   Incisions clean no hematoma Brisk PT doppler signals in feet  Assessment/Planning: Transfer to 4e D/c foley  Ambulate Acute blood loss anemia trend Home next 1-2 days  Appreciate Cardiology BP recs hypertension controlled last night Ruta Hinds 11/12/2019 7:43 AM --  Laboratory Lab Results: Recent Labs    11/12/19 0046  WBC 7.8  HGB 9.7*  HCT 30.6*  PLT 167   BMET Recent Labs    11/12/19 0046  NA 141  K 3.8  CL 109  CO2 24  GLUCOSE 118*  BUN 6*  CREATININE 0.63  CALCIUM 8.7*    COAG Lab Results  Component Value Date   INR 1.0 11/08/2019   INR 1.0 09/11/2019   INR 1.1 08/23/2019   No results found for: PTT

## 2019-11-12 NOTE — Evaluation (Signed)
Physical Therapy Evaluation Patient Details Name: Katherine Archer MRN: [id number] DOB: [birth year] Today's Date: 11/12/2019   History of Present Illness  Katherine Archer is a 64 y.o. female with a hx of carotid disease, CAD on coronary CTA, HTN, HLD, hx of CVA (currently active with Outpatient Therapies), + tobacco use who is now s/p abdominal aortogram W/ BLE Runoff  Clinical Impression  Pt admitted with above diagnosis; pt states she was I with use of AD prior to admission for functional mobility tasks; pt is now requiring min guard assist due to pain; Pt reports previous CVA and per pt no follow up PT, but Dr. Oneida Alar note states she was getting OPPT; Pt will benefit from skilled PT intervention to address deficits in gait, coordination, safety and endurance to maximize independence with functional mobility prior to discharge.    BP during session: 124/56 initial seated BP 142/73 sitting edge of chair 129/65 standing 137/86 sitting EOB post-ambulation  Interpreter used: Christy Sartorius #[id number]    Follow Up Recommendations Outpatient PT    Equipment Recommendations  None recommended by PT    Recommendations for Other Services       Precautions / Restrictions Precautions Precautions: Fall Precaution Comments: watch BP Restrictions Weight Bearing Restrictions: No      Mobility  Bed Mobility Overal bed mobility: Needs Assistance Bed Mobility: Sit to Supine       Sit to supine: Min guard;HOB elevated   General bed mobility comments: cues for sequencing and assist with line management only, no physical assist needed    Transfers Overall transfer level: Needs assistance Equipment used: Rolling walker (2 wheeled) Transfers: Sit to/from Stand Sit to Stand: Min guard         General transfer comment: good hand placement, min guard for safety and line management - no physical assist needed  Ambulation/Gait Ambulation/Gait assistance: Min guard Gait Distance (Feet): 270  Feet Assistive device: Rolling walker (2 wheeled) Gait Pattern/deviations: Step-to pattern     General Gait Details: decreased weight on RLE due to pain; decreased knee flexion noted during swing; decreased DF  Stairs            Wheelchair Mobility    Modified Rankin (Stroke Patients Only)       Balance Overall balance assessment: Mild deficits observed, not formally tested                                           Pertinent Vitals/Pain Pain Assessment: 0-10 Pain Score: 2  Pain Location: R Leg Pain Descriptors / Indicators: Discomfort;Sore Pain Intervention(s): Monitored during session;Repositioned    Home Living Family/patient expects to be discharged to:: Private residence Living Arrangements: Children Available Help at Discharge: Family;Available 24 hours/day Type of Home: Mobile home Home Access: Stairs to enter Entrance Stairs-Rails: Can reach both Entrance Stairs-Number of Steps: 6 Home Layout: One level Home Equipment: Walker - 2 wheels;Cane - single point;Hand held shower head Additional Comments: Pt will shower every once and a while - typically sponge bathes    Prior Function Level of Independence: Independent with assistive device(s)         Comments: independent using cane as needed, ADLs and limited IADLs      Hand Dominance   Dominant Hand: Right    Extremity/Trunk Assessment   Upper Extremity Assessment Upper Extremity Assessment: Defer to OT evaluation LUE Deficits / Details: fine motor deficits/coordination,  ok for holding onto walker, fatigues quickly LUE Sensation: WNL LUE Coordination: decreased fine motor    Lower Extremity Assessment Lower Extremity Assessment: Overall WFL for tasks assessed (RLE movement limited due to pain)       Communication   Communication: Prefers language other than [place];Interpreter utilized  Cognition Arousal/Alertness: Awake/alert Behavior During Therapy: WFL for tasks  assessed/performed Overall Cognitive Status: Within Functional Limits for tasks assessed                                 General Comments: Pt did struggle in answering some bathroom set up questions, could be language/cultural component      General Comments      Exercises     Assessment/Plan    PT Assessment Patient needs continued PT services  PT Problem List Decreased mobility;Decreased coordination;Decreased balance;Decreased activity tolerance       PT Treatment Interventions Therapeutic exercise;DME instruction;Gait training;Balance training;Stair training;Functional mobility training;Therapeutic activities;Patient/family education    PT Goals (Current goals can be found in the Care Plan section)  Acute Rehab PT Goals Patient Stated Goal: get better and get home PT Goal Formulation: With patient Time For Goal Achievement: 11/26/19 Potential to Achieve Goals: Good    Frequency Min 3X/week   Barriers to discharge        Co-evaluation PT/OT/SLP Co-Evaluation/Treatment: Yes Reason for Co-Treatment: Complexity of the patient's impairments (multi-system involvement);For patient/therapist safety;To address functional/ADL transfers PT goals addressed during session: Mobility/safety with mobility;Balance;Proper use of DME OT goals addressed during session: ADL's and self-care;Proper use of Adaptive equipment and DME;Strengthening/ROM       AM-PAC PT "6 Clicks" Mobility  Outcome Measure Help needed turning from your back to your side while in a flat bed without using bedrails?: None Help needed moving from lying on your back to sitting on the side of a flat bed without using bedrails?: A Little Help needed moving to and from a bed to a chair (including a wheelchair)?: A Little Help needed standing up from a chair using your arms (e.g., wheelchair or bedside chair)?: A Little Help needed to walk in hospital room?: A Little Help needed climbing 3-5 steps with  a railing? : A Little 6 Click Score: 19    End of Session   Activity Tolerance: Patient tolerated treatment well Patient left: in bed;with call bell/phone within reach Nurse Communication: Mobility status PT Visit Diagnosis: Unsteadiness on feet (R26.81);Other abnormalities of gait and mobility (R26.89)    Time: 9629-5284 PT Time Calculation (min) (ACUTE ONLY): 33 min   Charges:   PT Evaluation $PT Eval Low Complexity: 1 Low PT Treatments $Gait Training: 8-22 mins        Lyanne Co, DPT Acute Rehabilitation Services [phone]  [place] 11/12/2019, 1:32 PM

## 2019-11-12 NOTE — Evaluation (Signed)
Occupational Therapy Evaluation Patient Details Name: Katherine Archer MRN: [id number] DOB: January 07, 1955 Today's Date: 11/12/2019    History of Present Illness Katherine Archer is a 64 y.o. female with a hx of carotid disease, CAD on coronary CTA, HTN, HLD, hx of CVA (unsure of follow up therapies), + tobacco use who is now s/p abdominal aortogram W/ BLE Runoff   Clinical Impression   Pt from home, reports mod I with RW and does own bathing/dressing and assists with IADL to some extent. She reports that after recent CVA she was not getting any therapy - but Dr. Oneida Alar note says that she was? Today she is able to perform transfers and hallway ambulation at min guard with RW. She reports that typically she sponge bathes, and is overall min A for ADL at this time including access to LB for ADL. She will benefit from skilled OT in the acute setting and will benefit from skilled OT in the Outpatient setting with specific focus on LUE function as there are deficits in strength and coordination and she reports that it fatigues quickly when she is attempting to perform functional tasks required by IADL. Next session plan to establish HEP and provide fine motor coordination handout (spanish version)  BP during session: 124/56 initial seated BP 142/73 sitting edge of chair 129/65 standing 137/86 sitting EOB post-ambulation  Interpreter used: Christy Sartorius #[id number]    Follow Up Recommendations  Outpatient OT    Equipment Recommendations  None recommended by OT    Recommendations for Other Services       Precautions / Restrictions Precautions Precautions: Fall Precaution Comments: watch BP Restrictions Weight Bearing Restrictions: No      Mobility Bed Mobility Overal bed mobility: Needs Assistance Bed Mobility: Sit to Supine       Sit to supine: Min guard;HOB elevated   General bed mobility comments: cues for sequencing and assist with line management only, no physical assist needed     Transfers Overall transfer level: Needs assistance Equipment used: Rolling walker (2 wheeled) Transfers: Sit to/from Stand Sit to Stand: Min guard         General transfer comment: good hand placement, min guard for safety and line management - no physical assist needed    Balance Overall balance assessment: Mild deficits observed, not formally tested                                         ADL either performed or assessed with clinical judgement   ADL Overall ADL's : Needs assistance/impaired Eating/Feeding: Sitting;Minimal assistance Eating/Feeding Details (indicate cue type and reason): struggled with some fine motor components of setting up plate Grooming: Min guard;Standing   Upper Body Bathing: Min guard   Lower Body Bathing: Minimal assistance   Upper Body Dressing : Minimal assistance   Lower Body Dressing: Minimal assistance;Sit to/from stand   Toilet Transfer: Min guard;Ambulation;RW Toilet Transfer Details (indicate cue type and reason): min guard for safety and line management   Toileting - Clothing Manipulation Details (indicate cue type and reason): currently with foley     Functional mobility during ADLs: Min guard;Minimal assistance;Rolling walker General ADL Comments: deficits in LUE (fine motor) and stamina     Vision Patient Visual Report: No change from baseline Vision Assessment?: No apparent visual deficits     Perception     Praxis      Pertinent Vitals/Pain Pain Assessment:  0-10 Pain Score: 2  Pain Location: R Leg Pain Descriptors / Indicators: Discomfort;Sore Pain Intervention(s): Monitored during session;Repositioned     Hand Dominance Right   Extremity/Trunk Assessment Upper Extremity Assessment Upper Extremity Assessment: LUE deficits/detail LUE Deficits / Details: fine motor deficits/coordination, ok for holding onto walker, fatigues quickly LUE Sensation: WNL LUE Coordination: decreased fine motor    Lower Extremity Assessment Lower Extremity Assessment: Defer to PT evaluation       Communication Communication Communication: Prefers language other than [place];Interpreter utilized Christy Sartorius [phone])   Cognition Arousal/Alertness: Awake/alert Behavior During Therapy: WFL for tasks assessed/performed Overall Cognitive Status: Within Functional Limits for tasks assessed                                 General Comments: Pt did struggle in answering some bathroom set up questions, could be language/cultural component   General Comments       Exercises     Shoulder Instructions      Home Living Family/patient expects to be discharged to:: Private residence Living Arrangements: Children Available Help at Discharge: Family;Available 24 hours/day Type of Home: Mobile home Home Access: Stairs to enter Entrance Stairs-Number of Steps: 6 Entrance Stairs-Rails: Can reach both Home Layout: One level     Bathroom Shower/Tub: Occupational psychologist: Standard Bathroom Accessibility: Yes How Accessible: Accessible via walker Home Equipment: [place] - 2 wheels;Cane - single point;Hand held shower head   Additional Comments: Pt will shower every once and a while - typically sponge bathes      Prior Functioning/Environment Level of Independence: Independent with assistive device(s)        Comments: independent using cane as needed, ADLs and limited IADLs         OT Problem List: Decreased strength;Decreased range of motion;Decreased activity tolerance;Decreased coordination;Impaired UE functional use      OT Treatment/Interventions: Self-care/ADL training;Therapeutic exercise;Therapeutic activities;Patient/family education;Balance training    OT Goals(Current goals can be found in the care plan section) Acute Rehab OT Goals Patient Stated Goal: get better and get home OT Goal Formulation: With patient Time For Goal Achievement: 11/26/19 Potential to  Achieve Goals: Good ADL Goals Pt Will Perform Grooming: with modified independence;standing Pt Will Perform Upper Body Dressing: with modified independence;sitting Pt Will Perform Lower Body Dressing: with supervision;sit to/from stand Pt Will Transfer to Toilet: with supervision;ambulating Pt Will Perform Toileting - Clothing Manipulation and hygiene: with modified independence;sitting/lateral leans Pt/caregiver will Perform Home Exercise Program: Left upper extremity;With theraputty;With written HEP provided  OT Frequency: Min 2X/week   Barriers to D/C:            Co-evaluation PT/OT/SLP Co-Evaluation/Treatment: Yes Reason for Co-Treatment: Complexity of the patient's impairments (multi-system involvement);For patient/therapist safety;To address functional/ADL transfers PT goals addressed during session: Mobility/safety with mobility;Balance;Proper use of DME OT goals addressed during session: ADL's and self-care;Proper use of Adaptive equipment and DME;Strengthening/ROM      AM-PAC OT "6 Clicks" Daily Activity     Outcome Measure Help from another person eating meals?: A Little Help from another person taking care of personal grooming?: A Little Help from another person toileting, which includes using toliet, bedpan, or urinal?: A Little Help from another person bathing (including washing, rinsing, drying)?: A Little Help from another person to put on and taking off regular upper body clothing?: A Little Help from another person to put on and taking off regular lower body clothing?: A  Lot 6 Click Score: 17   End of Session Equipment Utilized During Treatment: Gait belt;Rolling walker Nurse Communication: Mobility status  Activity Tolerance: Patient tolerated treatment well Patient left: in bed;with call bell/phone within reach  OT Visit Diagnosis: Other abnormalities of gait and mobility (R26.89);Muscle weakness (generalized) (M62.81)                Time: 8250-5397 OT Time  Calculation (min): 34 min Charges:  OT General Charges $OT Visit: 1 Visit OT Evaluation $OT Eval Moderate Complexity: [place] OTR/L Acute Rehabilitation Services Pager: [phone] Office: [place] 11/12/2019, 12:44 PM

## 2019-11-13 ENCOUNTER — Inpatient Hospital Stay (HOSPITAL_COMMUNITY): Payer: Medicare HMO

## 2019-11-13 DIAGNOSIS — I739 Peripheral vascular disease, unspecified: Secondary | ICD-10-CM

## 2019-11-13 DIAGNOSIS — M79671 Pain in right foot: Secondary | ICD-10-CM

## 2019-11-13 DIAGNOSIS — R9431 Abnormal electrocardiogram [ECG] [EKG]: Secondary | ICD-10-CM

## 2019-11-13 NOTE — Plan of Care (Signed)

## 2019-11-13 NOTE — Progress Notes (Signed)
Progress Note  Patient Name: Katherine Archer Date of Encounter: 11/13/2019  Primary Cardiologist:   Berniece Salines, DO   Subjective   She reports a headache but no chest pain.  No SOB.   Inpatient Medications    Scheduled Meds: . aspirin EC  81 mg Oral Daily  . atorvastatin  80 mg Oral q1800  . Chlorhexidine Gluconate Cloth  6 each Topical Daily  . docusate sodium  100 mg Oral Daily  . heparin  5,000 Units Subcutaneous Q8H  . hydrALAZINE  25 mg Oral TID  . hydrochlorothiazide  25 mg Oral Daily  . pantoprazole  40 mg Oral Daily   Continuous Infusions: . sodium chloride    . magnesium sulfate bolus IVPB     PRN Meds: sodium chloride, acetaminophen **OR** acetaminophen, alum & mag hydroxide-simeth, guaiFENesin-dextromethorphan, hydrALAZINE, labetalol, magnesium sulfate bolus IVPB, metoprolol tartrate, morphine injection, ondansetron, oxyCODONE-acetaminophen, phenol, potassium chloride   Vital Signs    Vitals:   11/13/19 0400 11/13/19 0500 11/13/19 0600 11/13/19 0700  BP: (!) 115/56     Pulse: 64 68 77 79  Resp: 16 (!) 21 (!) 26 16  Temp:      TempSrc:      SpO2: 100% 97% 95% 98%  Weight:      Height:        Intake/Output Summary (Last 24 hours) at 11/13/2019 0827 Last data filed at 11/12/2019 2300 Gross per 24 hour  Intake 480 ml  Output 640 ml  Net -160 ml   Filed Weights   11/11/19 0622  Weight: 54.9 kg    Telemetry    NSR - Personally Reviewed  ECG    NA - Personally Reviewed  Physical Exam   GEN: No acute distress.   Neck: No  JVD Cardiac: RRR, no murmurs, rubs, or gallops.  Respiratory: Clear  to auscultation bilaterally. GI: Soft, nontender, non-distended  MS: No  edema; No deformity. Neuro:  Nonfocal  Psych: Normal affect   Labs    Chemistry Recent Labs  Lab 11/08/19 0858 11/12/19 0046  NA 140 141  K 3.7 3.8  CL 107 109  CO2 23 24  GLUCOSE 112* 118*  BUN 12 6*  CREATININE 0.82 0.63  CALCIUM 9.3 8.7*  PROT 7.3  --   ALBUMIN  4.1  --   AST 19  --   ALT 19  --   ALKPHOS 72  --   BILITOT 0.5  --   GFRNONAA >60 >60  ANIONGAP 10 8     Hematology Recent Labs  Lab 11/08/19 0858 11/12/19 0046  WBC 5.6 7.8  RBC 4.09 3.14*  HGB 12.6 9.7*  HCT 40.4 30.6*  MCV 98.8 97.5  MCH 30.8 30.9  MCHC 31.2 31.7  RDW 12.7 13.1  PLT 241 167    Cardiac EnzymesNo results for input(s): TROPONINI in the last 168 hours. No results for input(s): TROPIPOC in the last 168 hours.   BNPNo results for input(s): BNP, PROBNP in the last 168 hours.   DDimer No results for input(s): DDIMER in the last 168 hours.   Radiology    ECHOCARDIOGRAM COMPLETE  Result Date: 11/12/2019    ECHOCARDIOGRAM REPORT   Patient Name:   Katherine Archer Date of Exam: 11/12/2019 Medical Rec #:  [id number]   Height:       66.0 in Accession #:    [id number]  Weight:       121.0 lb Date of Birth:  [birth year]    BSA:  1.615 m Patient Age:    64 years    BP:           98/55 mmHg Patient Gender: F           HR:           77 bpm. Exam Location:  Inpatient Procedure: 2D Echo, Cardiac Doppler and Color Doppler Indications:    Abnormal ECG  History:        Patient has prior history of Echocardiogram examinations, most                 recent 08/24/2019. CAD; Risk Factors:Dyslipidemia, Hypertension                 and Former Smoker. PAD. H/O stroke.  Sonographer:    Clayton Lefort RDCS (AE) Referring Phys: [place]  1. Left ventricular ejection fraction, by estimation, is 60 to 65%. The left ventricle has normal function. The left ventricle has no regional wall motion abnormalities. There is mild concentric left ventricular hypertrophy. Left ventricular diastolic parameters are consistent with Grade II diastolic dysfunction (pseudonormalization). Elevated left atrial pressure.  2. Right ventricular systolic function is normal. The right ventricular size is normal. There is mildly elevated pulmonary artery systolic pressure. The estimated right ventricular  systolic pressure is 19.5 mmHg.  3. The mitral valve is normal in structure. No evidence of mitral valve regurgitation. No evidence of mitral stenosis.  4. The aortic valve is normal in structure. Aortic valve regurgitation is not visualized. No aortic stenosis is present.  5. The inferior vena cava is dilated in size with <50% respiratory variability, suggesting right atrial pressure of 15 mmHg. FINDINGS  Left Ventricle: Left ventricular ejection fraction, by estimation, is 60 to 65%. The left ventricle has normal function. The left ventricle has no regional wall motion abnormalities. The left ventricular internal cavity size was normal in size. There is  mild concentric left ventricular hypertrophy. Left ventricular diastolic parameters are consistent with Grade II diastolic dysfunction (pseudonormalization). Elevated left atrial pressure. Right Ventricle: The right ventricular size is normal. No increase in right ventricular wall thickness. Right ventricular systolic function is normal. There is mildly elevated pulmonary artery systolic pressure. The tricuspid regurgitant velocity is 2.31  m/s, and with an assumed right atrial pressure of 15 mmHg, the estimated right ventricular systolic pressure is 09.3 mmHg. Left Atrium: Left atrial size was normal in size. Right Atrium: Right atrial size was normal in size. Pericardium: There is no evidence of pericardial effusion. Mitral Valve: The mitral valve is normal in structure. No evidence of mitral valve regurgitation. No evidence of mitral valve stenosis. Tricuspid Valve: The tricuspid valve is normal in structure. Tricuspid valve regurgitation is trivial. No evidence of tricuspid stenosis. Aortic Valve: The aortic valve is normal in structure. Aortic valve regurgitation is not visualized. No aortic stenosis is present. Aortic valve mean gradient measures 7.0 mmHg. Aortic valve peak gradient measures 12.7 mmHg. Aortic valve area, by VTI measures 3.09 cm. Pulmonic  Valve: The pulmonic valve was normal in structure. Pulmonic valve regurgitation is not visualized. No evidence of pulmonic stenosis. Aorta: The aortic root is normal in size and structure. Venous: The inferior vena cava is dilated in size with less than 50% respiratory variability, suggesting right atrial pressure of 15 mmHg. IAS/Shunts: No atrial level shunt detected by color flow Doppler.  LEFT VENTRICLE PLAX 2D LVIDd:         4.20 cm  Diastology LVIDs:  2.50 cm  LV e' medial:    6.53 cm/s LV PW:         1.20 cm  LV E/e' medial:  16.5 LV IVS:        1.50 cm  LV e' lateral:   8.05 cm/s LVOT diam:     2.00 cm  LV E/e' lateral: 13.4 LV SV:         100 LV SV Index:   62 LVOT Area:     3.14 cm  RIGHT VENTRICLE             IVC RV Basal diam:  2.90 cm     IVC diam: 2.60 cm RV S prime:     17.50 cm/s TAPSE (M-mode): 2.6 cm LEFT ATRIUM             Index       RIGHT ATRIUM           Index LA diam:        2.50 cm 1.55 cm/m  RA Area:     13.90 cm LA Vol (A2C):   41.0 ml 25.38 ml/m RA Volume:   34.30 ml  21.24 ml/m LA Vol (A4C):   50.0 ml 30.96 ml/m LA Biplane Vol: 47.2 ml 29.22 ml/m  AORTIC VALVE AV Area (Vmax):    3.23 cm AV Area (Vmean):   2.93 cm AV Area (VTI):     3.09 cm AV Vmax:           178.00 cm/s AV Vmean:          120.000 cm/s AV VTI:            0.323 m AV Peak Grad:      12.7 mmHg AV Mean Grad:      7.0 mmHg LVOT Vmax:         183.00 cm/s LVOT Vmean:        112.000 cm/s LVOT VTI:          0.318 m LVOT/AV VTI ratio: 0.98  AORTA Ao Root diam: 2.90 cm Ao Asc diam:  3.10 cm MITRAL VALVE                TRICUSPID VALVE MV Area (PHT): 4.15 cm     TR Peak grad:   21.3 mmHg MV Decel Time: 183 msec     TR Vmax:        231.00 cm/s MV E velocity: 108.00 cm/s MV A velocity: 110.00 cm/s  SHUNTS MV E/A ratio:  0.98         Systemic VTI:  0.32 m                             Systemic Diam: 2.00 cm Dani Gobble Croitoru MD Electronically signed by Sanda Klein MD Signature Date/Time: 11/12/2019/6:08:06 PM    Final      Cardiac Studies   ECHO:  1. Left ventricular ejection fraction, by estimation, is 60 to 65%. The  left ventricle has normal function. The left ventricle has no regional  wall motion abnormalities. There is mild concentric left ventricular  hypertrophy. Left ventricular diastolic  parameters are consistent with Grade II diastolic dysfunction  (pseudonormalization). Elevated left atrial pressure.  2. Right ventricular systolic function is normal. The right ventricular  size is normal. There is mildly elevated pulmonary artery systolic  pressure. The estimated right ventricular systolic pressure is 54.0 mmHg.  3. The mitral valve is normal  in structure. No evidence of mitral valve  regurgitation. No evidence of mitral stenosis.  4. The aortic valve is normal in structure. Aortic valve regurgitation is  not visualized. No aortic stenosis is present.  5. The inferior vena cava is dilated in size with <50% respiratory  variability, suggesting right atrial pressure of 15 mmHg.  Patient Profile     64 y.o. female with a hx of carotid disease, CAD on coronary CTA, HTN, HLD, hx of CVA, + tobacco usewho is being seen for the evaluation of HTN post axillary bifemat the request of Dr. Oneida Alar.   Assessment & Plan    HTN:  BP is labile but controlled.  Continue current therapy with hold parameters.   ABNORMAL EKG:    Normal echo as above.   Trop negative.  No change in therapy.  Need to avoid QT prolonging drugs.  We can get follow up in the office to see if this EKG returns to baseline.      For questions or updates, please contact [place] Please consult [email or web address] for contact info under Cardiology/STEMI.   Signed, Minus Breeding, MD  11/13/2019, 8:27 AM

## 2019-11-13 NOTE — Progress Notes (Signed)
Physical Therapy Treatment Patient Details Name: Katherine Archer MRN: [id number] DOB: 14-Apr-1955 Today's Date: 11/13/2019    History of Present Illness Katherine Archer is a 64 y.o. female with a hx of carotid disease, CAD on coronary CTA, HTN, HLD, hx of CVA (currently active with Outpatient Therapies), + tobacco use who is now s/p abdominal aortogram W/ BLE Runoff    PT Comments    Pt fully participated in session; pt with improved gait tolerance and decrease pain during ambulation; pt requiring rest during therex due to fatigue; pt continues to require use of RW due to balance deficits and decreased weight bearing through RLE; pt will benefit from skilled PT to address deficits in balance, strength, coordination, gait and endurance to maximize independence with functional mobility prior to discharge.     Follow Up Recommendations  Home health PT     Equipment Recommendations  None recommended by PT    Recommendations for Other Services       Precautions / Restrictions Precautions Precautions: Fall Precaution Comments: watch BP Restrictions Weight Bearing Restrictions: No    Mobility  Bed Mobility Overal bed mobility: Modified Independent Bed Mobility: Supine to Sit     Supine to sit: Modified independent (Device/Increase time);HOB elevated        Transfers Overall transfer level: Needs assistance Equipment used: Rolling walker (2 wheeled) Transfers: Sit to/from Stand Sit to Stand: Supervision         General transfer comment: assist for line management and set up  Ambulation/Gait Ambulation/Gait assistance: Supervision Gait Distance (Feet): 380 Feet Assistive device: Rolling walker (2 wheeled)       General Gait Details: decreased weight on RLE due to pain; decreased knee flexion noted during swing; decreased DF   Stairs             Wheelchair Mobility    Modified Rankin (Stroke Patients Only)       Balance Overall balance assessment: Mild  deficits observed, not formally tested                                          Cognition Arousal/Alertness: Awake/alert Behavior During Therapy: WFL for tasks assessed/performed Overall Cognitive Status: Within Functional Limits for tasks assessed                                        Exercises General Exercises - Lower Extremity Long Arc Quad: AROM;Both;20 reps;Seated Heel Slides: AROM;Both;20 reps;Supine Hip ABduction/ADduction: AROM;Both;20 reps;Supine Straight Leg Raises: AROM;Both;20 reps;Supine Hip Flexion/Marching: AROM;Both;20 reps;Seated    General Comments General comments (skin integrity, edema, etc.): VSS slight increase in HR to 127 during ambulation; monitor detecting possible V-tach RN states it was a bad connection      Pertinent Vitals/Pain Pain Location: pt stating mroe numbness than pain in R LE    Home Living                      Prior Function            PT Goals (current goals can now be found in the care plan section) Acute Rehab PT Goals Patient Stated Goal: get better and get home PT Goal Formulation: With patient Time For Goal Achievement: 11/26/19 Potential to Achieve Goals: Good Progress towards PT goals: Progressing  toward goals    Frequency           PT Plan Current plan remains appropriate    Co-evaluation              AM-PAC PT "6 Clicks" Mobility   Outcome Measure  Help needed turning from your back to your side while in a flat bed without using bedrails?: None Help needed moving from lying on your back to sitting on the side of a flat bed without using bedrails?: None Help needed moving to and from a bed to a chair (including a wheelchair)?: None Help needed standing up from a chair using your arms (e.g., wheelchair or bedside chair)?: None Help needed to walk in hospital room?: None Help needed climbing 3-5 steps with a railing? : A Little 6 Click Score: 23    End of  Session Equipment Utilized During Treatment: Gait belt Activity Tolerance: Patient tolerated treatment well Patient left: in chair;with call bell/phone within reach Nurse Communication: Mobility status PT Visit Diagnosis: Unsteadiness on feet (R26.81);Other abnormalities of gait and mobility (R26.89)     Time: 4431-5400 PT Time Calculation (min) (ACUTE ONLY): 38 min  Charges:  $Gait Training: 8-22 mins $Therapeutic Exercise: 23-37 mins                     Lyanne Co, DPT Acute Rehabilitation Services [phone]   [place] 11/13/2019, 1:07 PM

## 2019-11-13 NOTE — Progress Notes (Signed)
Mobility Specialist - Progress Note   11/13/19 1525  Mobility  Activity Ambulated in hall  Level of Assistance Minimal assist, patient does 75% or more  Assistive Device Front wheel walker  Distance Ambulated (ft) 400 ft  Mobility Response Tolerated well  Mobility performed by Mobility specialist  $Mobility charge 1 Mobility    Pre-mobility: 80 HR, 100% SpO2 During mobility: 93 HR, 100% SpO2 Post-mobility: 73 HR, 99% SpO2  Pt min assist for bed mobility, stand by assist during ambulation. She was asx throughout.   Pricilla Handler Mobility Specialist Mobility Specialist Phone: [phone]

## 2019-11-13 NOTE — Progress Notes (Signed)
ABI study completed.   See CVProc for preliminary results.   Griffin Basil, RDMS, RVT

## 2019-11-13 NOTE — Progress Notes (Addendum)
  Progress Note    11/13/2019 7:34 AM 2 Days Post-Op  Subjective:  Still some pain in R foot but overall feels better   Vitals:   11/13/19 0600 11/13/19 0700  BP:    Pulse: 77 79  Resp: (!) 26 16  Temp:    SpO2: 95% 98%   Physical Exam: Lungs:  Non labored Incisions:  Chest and groin incisions c/d/i Extremities:  Palpable ax fem bypass pulse; palpable fem fem bypass pulse Abdomen:  soft Neurologic: A&O  CBC    Component Value Date/Time   WBC 7.8 11/12/2019 0046   RBC 3.14 (L) 11/12/2019 0046   HGB 9.7 (L) 11/12/2019 0046   HCT 30.6 (L) 11/12/2019 0046   PLT 167 11/12/2019 0046   MCV 97.5 11/12/2019 0046   MCH 30.9 11/12/2019 0046   MCHC 31.7 11/12/2019 0046   RDW 13.1 11/12/2019 0046   LYMPHSABS 1.9 11/05/2013 1133   MONOABS 0.7 11/05/2013 1133   EOSABS 0.1 11/05/2013 1133   BASOSABS 0.0 11/05/2013 1133    BMET    Component Value Date/Time   NA 141 11/12/2019 0046   NA 136 09/02/2019 1545   K 3.8 11/12/2019 0046   CL 109 11/12/2019 0046   CO2 24 11/12/2019 0046   GLUCOSE 118 (H) 11/12/2019 0046   BUN 6 (L) 11/12/2019 0046   BUN 14 09/02/2019 1545   CREATININE 0.63 11/12/2019 0046   CALCIUM 8.7 (L) 11/12/2019 0046   GFRNONAA >60 11/12/2019 0046   GFRAA >60 09/11/2019 1202    INR    Component Value Date/Time   INR 1.0 11/08/2019 0858     Intake/Output Summary (Last 24 hours) at 11/13/2019 0734 Last data filed at 11/12/2019 2300 Gross per 24 hour  Intake 720 ml  Output 965 ml  Net -245 ml     Assessment/Plan:  64 y.o. female is s/p ax-bifemoral bypass 2 Days Post-Op   Palpable flow through ax-fem and fem fem bypass Check CBC Cardiology checking troponin and echo due to EKG change Encouraged OOB Transfer to floor Probable discharge home tomorrow   Dagoberto Ligas, PA-C [facility] [phone] 11/13/2019 7:34 AM  Agree with above. Will check ABI today to get new baseline. May eventually need right fem pop down  the road Still awaiting 4E bed since yesterday Plan for d/c home tomorrow  Ruta Hinds, MD [facility] [place]: [phone]

## 2019-11-14 DIAGNOSIS — I10 Essential (primary) hypertension: Secondary | ICD-10-CM | POA: Diagnosis not present

## 2019-11-14 LAB — CBC
HCT: 30.9 % — ABNORMAL LOW (ref 36.0–46.0)
Hemoglobin: 10 g/dL — ABNORMAL LOW (ref 12.0–15.0)
MCH: 31.1 pg (ref 26.0–34.0)
MCHC: 32.4 g/dL (ref 30.0–36.0)
MCV: 96 fL (ref 80.0–100.0)
Platelets: 148 10*3/uL — ABNORMAL LOW (ref 150–400)
RBC: 3.22 MIL/uL — ABNORMAL LOW (ref 3.87–5.11)
RDW: 12.8 % (ref 11.5–15.5)
WBC: 6 10*3/uL (ref 4.0–10.5)
nRBC: 0 % (ref 0.0–0.2)

## 2019-11-14 MED ORDER — HYDRALAZINE HCL 25 MG PO TABS
25.0000 mg | ORAL_TABLET | Freq: Three times a day (TID) | ORAL | 1 refills | Status: DC
Start: 2019-11-14 — End: 2020-04-08

## 2019-11-14 MED ORDER — OXYCODONE-ACETAMINOPHEN 5-325 MG PO TABS: 1.0000 | ORAL_TABLET | Freq: Four times a day (QID) | ORAL | 0 refills | Status: DC | PRN

## 2019-11-14 NOTE — Progress Notes (Addendum)
Progress Note  Patient Name: Katherine Archer Date of Encounter: 11/14/2019  Primary Cardiologist: Berniece Salines, DO  Subjective   No complaints today. BPs remain labile.   Inpatient Medications    Scheduled Meds: . aspirin EC  81 mg Oral Daily  . atorvastatin  80 mg Oral q1800  . Chlorhexidine Gluconate Cloth  6 each Topical Daily  . docusate sodium  100 mg Oral Daily  . heparin  5,000 Units Subcutaneous Q8H  . hydrALAZINE  25 mg Oral TID  . hydrochlorothiazide  25 mg Oral Daily  . pantoprazole  40 mg Oral Daily   Continuous Infusions: . sodium chloride    . magnesium sulfate bolus IVPB     PRN Meds: sodium chloride, acetaminophen **OR** acetaminophen, alum & mag hydroxide-simeth, guaiFENesin-dextromethorphan, hydrALAZINE, labetalol, magnesium sulfate bolus IVPB, metoprolol tartrate, morphine injection, ondansetron, oxyCODONE-acetaminophen, phenol, potassium chloride   Vital Signs    Vitals:   11/13/19 1825 11/13/19 1945 11/13/19 2306 11/14/19 0357  BP: 124/68 (!) 98/50 (!) 126/51 132/64  Pulse: 74 76 61 69  Resp: 19 19 18 20   Temp: 98.7 F (37.1 C) 98.7 F (37.1 C) 98.5 F (36.9 C) (!) 97.5 F (36.4 C)  TempSrc: Oral Oral Oral Oral  SpO2: 99% 97% 98% 99%  Weight:      Height:        Intake/Output Summary (Last 24 hours) at 11/14/2019 0743 Last data filed at 11/14/2019 0500 Gross per 24 hour  Intake 340 ml  Output --  Net 340 ml   Filed Weights   11/11/19 0622  Weight: 54.9 kg    Physical Exam   General: Well developed, well nourished, NAD Lungs:Clear to ausculation bilaterally. No wheezes, rales, or rhonchi. Breathing is unlabored. Cardiovascular: RRR with S1 S2. No murmurs Abdomen: Soft, non-tender, non-distended. No obvious abdominal masses. Extremities: No edema.  Neuro: Alert and oriented. No focal deficits. No facial asymmetry. MAE spontaneously. Psych: Responds to questions appropriately with normal affect.    Labs    Chemistry Recent  Labs  Lab 11/08/19 0858 11/12/19 0046  NA 140 141  K 3.7 3.8  CL 107 109  CO2 23 24  GLUCOSE 112* 118*  BUN 12 6*  CREATININE 0.82 0.63  CALCIUM 9.3 8.7*  PROT 7.3  --   ALBUMIN 4.1  --   AST 19  --   ALT 19  --   ALKPHOS 72  --   BILITOT 0.5  --   GFRNONAA >60 >60  ANIONGAP 10 8     Hematology Recent Labs  Lab 11/08/19 0858 11/12/19 0046 11/14/19 0128  WBC 5.6 7.8 6.0  RBC 4.09 3.14* 3.22*  HGB 12.6 9.7* 10.0*  HCT 40.4 30.6* 30.9*  MCV 98.8 97.5 96.0  MCH 30.8 30.9 31.1  MCHC 31.2 31.7 32.4  RDW 12.7 13.1 12.8  PLT 241 167 148*    Cardiac EnzymesNo results for input(s): TROPONINI in the last 168 hours. No results for input(s): TROPIPOC in the last 168 hours.   BNPNo results for input(s): BNP, PROBNP in the last 168 hours.   DDimer No results for input(s): DDIMER in the last 168 hours.   Radiology    VAS [place] ABI WITH/WO TBI  Result Date: 11/14/2019 LOWER EXTREMITY DOPPLER STUDY Indications: Claudication, rest pain, and Right foot pain at rest. High Risk Factors: Hypertension, hyperlipidemia.  Vascular Interventions: S/P 11-11-2019 Axilla BiFemoral Bypass Graft. Comparison Study: 08-15-2019 Performing Technologist: Griffin Basil RCT RDMS  Examination Guidelines: A  complete evaluation includes at minimum, Doppler waveform signals and systolic blood pressure reading at the level of bilateral brachial, anterior tibial, and posterior tibial arteries, when vessel segments are accessible. Bilateral testing is considered an integral part of a complete examination. Photoelectric Plethysmograph (PPG) waveforms and toe systolic pressure readings are included as required and additional duplex testing as needed. Limited examinations for reoccurring indications may be performed as noted.  ABI Findings: +---------+------------------+-----+-------------------+--------+ Right    Rt Pressure (mmHg)IndexWaveform           Comment   +---------+------------------+-----+-------------------+--------+ Brachial 133                    triphasic                   +---------+------------------+-----+-------------------+--------+ PTA      49                0.34 dampened monophasic         +---------+------------------+-----+-------------------+--------+ DP       37                0.26 dampened monophasic         +---------+------------------+-----+-------------------+--------+ Great Toe18                0.12 Abnormal                    +---------+------------------+-----+-------------------+--------+ +---------+------------------+-----+-------------------+-------+ Left     Lt Pressure (mmHg)IndexWaveform           Comment +---------+------------------+-----+-------------------+-------+ Brachial 144                    triphasic                  +---------+------------------+-----+-------------------+-------+ PTA      89                0.62 dampened monophasic        +---------+------------------+-----+-------------------+-------+ DP       86                0.60 dampened monophasic        +---------+------------------+-----+-------------------+-------+ Great Toe41                0.28 Normal                     +---------+------------------+-----+-------------------+-------+ TOES Findings: +----------+---------------+--------+-------+ Right ToesPressure (mmHg)WaveformComment +----------+---------------+--------+-------+ 1st Digit 18             Abnormal        +----------+---------------+--------+-------+  +---------+---------------+--------+-------+ Left ToesPressure (mmHg)WaveformComment +---------+---------------+--------+-------+ 1st Digit41             Normal          +---------+---------------+--------+-------+    Summary: Right: Resting right ankle-brachial index indicates severe right lower extremity arterial disease. Critical toe pressure. Left: Resting left ankle-brachial  index indicates moderate left lower extremity arterial disease.  *See table(s) above for measurements and observations.  Electronically signed by Harold Barban MD on 11/14/2019 at 7:31:20 AM.    Final    ECHOCARDIOGRAM COMPLETE  Result Date: 11/12/2019    ECHOCARDIOGRAM REPORT   Patient Name:   AYANA IMHOF Date of Exam: 11/12/2019 Medical Rec #:  [id number]   Height:       66.0 in Accession #:    [id number]  Weight:       121.0 lb Date of Birth:  08-May-1955    BSA:  1.615 m Patient Age:    19 years    BP:           98/55 mmHg Patient Gender: F           HR:           77 bpm. Exam Location:  Inpatient Procedure: 2D Echo, Cardiac Doppler and Color Doppler Indications:    Abnormal ECG  History:        Patient has prior history of Echocardiogram examinations, most                 recent 08/24/2019. CAD; Risk Factors:Dyslipidemia, Hypertension                 and Former Smoker. PAD. H/O stroke.  Sonographer:    Clayton Lefort RDCS (AE) Referring Phys: [place]  1. Left ventricular ejection fraction, by estimation, is 60 to 65%. The left ventricle has normal function. The left ventricle has no regional wall motion abnormalities. There is mild concentric left ventricular hypertrophy. Left ventricular diastolic parameters are consistent with Grade II diastolic dysfunction (pseudonormalization). Elevated left atrial pressure.  2. Right ventricular systolic function is normal. The right ventricular size is normal. There is mildly elevated pulmonary artery systolic pressure. The estimated right ventricular systolic pressure is 10.9 mmHg.  3. The mitral valve is normal in structure. No evidence of mitral valve regurgitation. No evidence of mitral stenosis.  4. The aortic valve is normal in structure. Aortic valve regurgitation is not visualized. No aortic stenosis is present.  5. The inferior vena cava is dilated in size with <50% respiratory variability, suggesting right atrial pressure of 15 mmHg.  FINDINGS  Left Ventricle: Left ventricular ejection fraction, by estimation, is 60 to 65%. The left ventricle has normal function. The left ventricle has no regional wall motion abnormalities. The left ventricular internal cavity size was normal in size. There is  mild concentric left ventricular hypertrophy. Left ventricular diastolic parameters are consistent with Grade II diastolic dysfunction (pseudonormalization). Elevated left atrial pressure. Right Ventricle: The right ventricular size is normal. No increase in right ventricular wall thickness. Right ventricular systolic function is normal. There is mildly elevated pulmonary artery systolic pressure. The tricuspid regurgitant velocity is 2.31  m/s, and with an assumed right atrial pressure of 15 mmHg, the estimated right ventricular systolic pressure is 32.3 mmHg. Left Atrium: Left atrial size was normal in size. Right Atrium: Right atrial size was normal in size. Pericardium: There is no evidence of pericardial effusion. Mitral Valve: The mitral valve is normal in structure. No evidence of mitral valve regurgitation. No evidence of mitral valve stenosis. Tricuspid Valve: The tricuspid valve is normal in structure. Tricuspid valve regurgitation is trivial. No evidence of tricuspid stenosis. Aortic Valve: The aortic valve is normal in structure. Aortic valve regurgitation is not visualized. No aortic stenosis is present. Aortic valve mean gradient measures 7.0 mmHg. Aortic valve peak gradient measures 12.7 mmHg. Aortic valve area, by VTI measures 3.09 cm. Pulmonic Valve: The pulmonic valve was normal in structure. Pulmonic valve regurgitation is not visualized. No evidence of pulmonic stenosis. Aorta: The aortic root is normal in size and structure. Venous: The inferior vena cava is dilated in size with less than 50% respiratory variability, suggesting right atrial pressure of 15 mmHg. IAS/Shunts: No atrial level shunt detected by color flow Doppler.  LEFT  VENTRICLE PLAX 2D LVIDd:         4.20 cm  Diastology LVIDs:  2.50 cm  LV e' medial:    6.53 cm/s LV PW:         1.20 cm  LV E/e' medial:  16.5 LV IVS:        1.50 cm  LV e' lateral:   8.05 cm/s LVOT diam:     2.00 cm  LV E/e' lateral: 13.4 LV SV:         100 LV SV Index:   62 LVOT Area:     3.14 cm  RIGHT VENTRICLE             IVC RV Basal diam:  2.90 cm     IVC diam: 2.60 cm RV S prime:     17.50 cm/s TAPSE (M-mode): 2.6 cm LEFT ATRIUM             Index       RIGHT ATRIUM           Index LA diam:        2.50 cm 1.55 cm/m  RA Area:     13.90 cm LA Vol (A2C):   41.0 ml 25.38 ml/m RA Volume:   34.30 ml  21.24 ml/m LA Vol (A4C):   50.0 ml 30.96 ml/m LA Biplane Vol: 47.2 ml 29.22 ml/m  AORTIC VALVE AV Area (Vmax):    3.23 cm AV Area (Vmean):   2.93 cm AV Area (VTI):     3.09 cm AV Vmax:           178.00 cm/s AV Vmean:          120.000 cm/s AV VTI:            0.323 m AV Peak Grad:      12.7 mmHg AV Mean Grad:      7.0 mmHg LVOT Vmax:         183.00 cm/s LVOT Vmean:        112.000 cm/s LVOT VTI:          0.318 m LVOT/AV VTI ratio: 0.98  AORTA Ao Root diam: 2.90 cm Ao Asc diam:  3.10 cm MITRAL VALVE                TRICUSPID VALVE MV Area (PHT): 4.15 cm     TR Peak grad:   21.3 mmHg MV Decel Time: 183 msec     TR Vmax:        231.00 cm/s MV E velocity: 108.00 cm/s MV A velocity: 110.00 cm/s  SHUNTS MV E/A ratio:  0.98         Systemic VTI:  0.32 m                             Systemic Diam: 2.00 cm Sanda Klein MD Electronically signed by Sanda Klein MD Signature Date/Time: 11/12/2019/6:08:06 PM    Final    Telemetry    11/14/19 NSR- Personally Reviewed  ECG    No new tracing as of 11/14/19- Personally Reviewed  Cardiac Studies   Echocardiogram:  1. Left ventricular ejection fraction, by estimation, is 60 to 65%. The  left ventricle has normal function. The left ventricle has no regional  wall motion abnormalities. There is mild concentric left ventricular  hypertrophy. Left  ventricular diastolic  parameters are consistent with Grade II diastolic dysfunction  (pseudonormalization). Elevated left atrial pressure.  2. Right ventricular systolic function is normal. The right ventricular  size is normal. There is mildly elevated  pulmonary artery systolic  pressure. The estimated right ventricular systolic pressure is 25.0 mmHg.  3. The mitral valve is normal in structure. No evidence of mitral valve  regurgitation. No evidence of mitral stenosis.  4. The aortic valve is normal in structure. Aortic valve regurgitation is  not visualized. No aortic stenosis is present.  5. The inferior vena cava is dilated in size with <50% respiratory  variability, suggesting right atrial pressure of 15 mmHg.  Patient Profile     64 y.o. female with a hx of carotid disease, CAD on coronary CTA, HTN, HLD, hx of CVA, + tobacco usewho is being seen for the evaluation of HTN post axillary bifemat the request of Dr. Oneida Alar.   Assessment & Plan    1. HTN: -Labile but seems controlled>>>132/64>>126/51>>98/50>>124/68>>156/62 -Continue current regimen   2. Abnormal EKG: -Echocardiogram with normal LVEF and no RWMA with G2DD -Hst, negative  -QT prolonged>>avoinf QT prolonging medications  -Will follow OP EKG   Signed, Kathyrn Drown NP-C [facility] Pager: [phone] 11/14/2019, 7:43 AM     For questions or updates, please contact   Please consult [email or web address] for contact info under Cardiology/STEMI.  History and all data above reviewed.  Mild foot pain Patient examined.  I agree with the findings as above.  The patient exam reveals .  All available labs, radiology testing, previous records reviewed. Agree with documented assessment and plan. I discussed with her, through the interpretor that she will only be taking half the dose of hydralazine at discharge.    Jeneen Rinks Nagi Furio  11:18 AM  11/14/2019

## 2019-11-14 NOTE — Discharge Instructions (Signed)
 [facility]  Discharge instructions  Lower Extremity Bypass Surgery  Please refer to the following instruction for your post-procedure care. Your surgeon or physician assistant will discuss any changes with you.  Activity  You are encouraged to walk as much as you can. You can slowly return to normal activities during the month after your surgery. Avoid strenuous activity and heavy lifting until your doctor tells you it's OK. Avoid activities such as vacuuming or swinging a golf club. Do not drive until your doctor give the OK and you are no longer taking prescription pain medications. It is also normal to have difficulty with sleep habits, eating and bowel movement after surgery. These will go away with time.  Bathing/Showering  You may shower after you go home. Do not soak in a bathtub, hot tub, or swim until the incision heals completely.  Incision Care  Clean your incision with mild soap and water. Shower every day. Pat the area dry with a clean towel. You do not need a bandage unless otherwise instructed. Do not apply any ointments or creams to your incision. If you have open wounds you will be instructed how to care for them or a visiting nurse may be arranged for you. If you have staples or sutures along your incision they will be removed at your post-op appointment. You may have skin glue on your incision. Do not peel it off. It will come off on its own in about one week. If you have a great deal of moisture in your groin, use a gauze help keep this area dry.  Diet  Resume your normal diet. There are no special food restrictions following this procedure. A low fat/ low cholesterol diet is recommended for all patients with vascular disease. In order to heal from your surgery, it is CRITICAL to get adequate nutrition. Your body requires vitamins, minerals, and protein. Vegetables are the best source of vitamins and minerals. Vegetables also provide the  perfect balance of protein. Processed food has little nutritional value, so try to avoid this.  Medications  Resume taking all your medications unless your doctor or nurse practitioner tells you not to. If your incision is causing pain, you may take over-the-counter pain relievers such as acetaminophen (Tylenol). If you were prescribed a stronger pain medication, please aware these medication can cause nausea and constipation. Prevent nausea by taking the medication with a snack or meal. Avoid constipation by drinking plenty of fluids and eating foods with high amount of fiber, such as fruits, vegetables, and grains. Take Colase 100 mg (an over-the-counter stool softener) twice a day as needed for constipation. Do not take Tylenol if you are taking prescription pain medications.  Follow Up  Our office will schedule a follow up appointment 2-3 weeks following discharge.  Please call us immediately for any of the following conditions  Severe or worsening pain in your legs or feet while at rest or while walking Increase pain, redness, warmth, or drainage (pus) from your incision site(s) Fever of 101 degree or higher The swelling in your leg with the bypass suddenly worsens and becomes more painful than when you were in the hospital If you have been instructed to feel your graft pulse then you should do so every day. If you can no longer feel this pulse, call the office immediately. Not all patients are given this instruction.  Leg swelling is common after leg bypass surgery.  The swelling should improve over a few months   following surgery. To improve the swelling, you may elevate your legs above the level of your heart while you are sitting or resting. Your surgeon or physician assistant may ask you to apply an ACE wrap or wear compression (TED) stockings to help to reduce swelling.  Reduce your risk of vascular disease  Stop smoking. If you would like help call [facility] at 1-800-QUIT-NOW  (1-800-784-8669) or [place] at [phone].  Manage your cholesterol Maintain a desired weight Control your diabetes weight Control your diabetes Keep your blood pressure down  If you have any questions, please call the office at [phone]   

## 2019-11-14 NOTE — TOC Transition Note (Signed)
Transition of Care (TOC) - CM/SW Discharge Note Marvetta Gibbons RN, BSN Transitions of Care Unit 4E- RN Case Manager See Treatment Team for direct phone #    Patient Details  Name: Katherine Archer MRN: [id number] Date of Birth: 31-Jul-1955  Transition of Care [facility]) CM/SW Contact:  Dawayne Patricia, RN Phone Number: 11/14/2019, 12:55 PM   Clinical Narrative:    Pt stable for transition home today, order placed for HHPT- CM spoke with pt at bedside with interpreter to review recs for HHPT- choice offered Per CMS guidelines from medicare.gov website with star ratings (copy placed in shadow chart) which pt states she does not have a preference- however pt does ask if she can do outpt therapy instead. Will request script from vascular to provide to pt to take to outpt therapy in [place] where pt reports she has gone before- [facility]) Per pt she has DME, and address, phone and PCP all confirmed with pt through Interpreter.  Written script for outpt PT provided   Final next level of care: OP Rehab Barriers to Discharge: No Barriers Identified   Patient Goals and CMS Choice Patient states their goals for this hospitalization and ongoing recovery are:: return home/outpt rehab CMS Medicare.gov Compare Post Acute Care list provided to:: Patient Choice offered to / list presented to : Patient  Discharge Placement                   Home    Discharge Plan and Services   Discharge Planning Services: CM Consult Post Acute Care Choice: Home Health          DME Arranged: N/A DME Agency: NA       HH Arranged: PT, Patient Refused HH (pt request outpt services) [place] Agency: NA        Social Determinants of Health (SDOH) Interventions     Readmission Risk Interventions Readmission Risk Prevention Plan 11/14/2019 08/27/2019  Post Dischage Appt Complete Complete  Medication Screening Complete Complete  Transportation Screening Complete Complete  Some recent data might  be hidden

## 2019-11-14 NOTE — Progress Notes (Addendum)
[facility]  Subjective  - Doing well without new complaints   Objective 132/64 69 (!) 97.5 F (36.4 C) (Oral) 20 99%  Intake/Output Summary (Last 24 hours) at 11/14/2019 0748 Last data filed at 11/14/2019 0500 Gross per 24 hour  Intake 340 ml  Output --  Net 340 ml    Right subclavian incision healing well, palpable pulse in Ax-fem bypass fem-fem bypass. Doppler signals intact B LE Abdomin soft Lungs non labored breathing Heart RRR   Assessment/Planning: POD # 3 64 y.o. female is s/p ax-bifemoral bypass  Cardiology consulted made recommendations: ABNORMAL EKG:    Normal echo as above.   Trop negative.  No change in therapy.  Need to avoid QT prolonging drugs.  We can get follow up in the office to see if this EKG returns to baseline.    HTN:  BP is labile but controlled.  Continue current therapy with hold parameters.  HH PT recommended patient has walker at home. Discharge home with f/u in 2-3 weeks with DR. Teng Decou.    ABI Findings:  +---------+------------------+-----+-------------------+--------+  Right  Rt Pressure (mmHg)IndexWaveform      Comment   +---------+------------------+-----+-------------------+--------+  Brachial 133           triphasic           +---------+------------------+-----+-------------------+--------+  PTA   49        0.34 dampened monophasic      +---------+------------------+-----+-------------------+--------+  DP    37        0.26 dampened monophasic      +---------+------------------+-----+-------------------+--------+  Great Toe18        0.12 Abnormal            +---------+------------------+-----+-------------------+--------+   +---------+------------------+-----+-------------------+-------+  Left   Lt Pressure (mmHg)IndexWaveform      Comment   +---------+------------------+-----+-------------------+-------+  Brachial 144           triphasic           +---------+------------------+-----+-------------------+-------+  PTA   89        0.62 dampened monophasic      +---------+------------------+-----+-------------------+-------+  DP    86        0.60 dampened monophasic      +---------+------------------+-----+-------------------+-------+  Great Toe41        0.28 Normal             +---------+------------------+-----+-------------------+-------+    TOES Findings:  +----------+---------------+--------+-------+  Right ToesPressure (mmHg)WaveformComment  +----------+---------------+--------+-------+  1st Digit 18       Abnormal      +----------+---------------+--------+-------+      +---------+---------------+--------+-------+  Left ToesPressure (mmHg)WaveformComment  +---------+---------------+--------+-------+  1st Digit41       Normal       +---------+---------------+--------+-------+   Summary:  Right: Resting right ankle-brachial index indicates severe right lower  extremity arterial disease.   Critical toe pressure.  Left: Resting left ankle-brachial index indicates moderate left lower  extremity arterial disease.   Roxy Horseman 11/14/2019 7:48 AM --  Palpable graft pulse incisions healing, some redness in toes right foot ABI Right 0.34 Left 0.6 compared to 0.2 and 0.4 preop  Pt states right foot pain is improved will need to watch over next few months to see if she needs fem pop  D/c home today  Follow up 2 weeks  Ruta Hinds, MD [facility] [place]: [phone]  Laboratory Lab Results: Recent Labs    11/12/19 0046 11/14/19 0128  WBC 7.8 6.0  HGB 9.7* 10.0*  HCT 30.6* 30.9*  PLT 167 148*   BMET Recent Labs    11/12/19 0046   NA 141  K 3.8  CL 109  CO2 24  GLUCOSE 118*  BUN 6*  CREATININE 0.63  CALCIUM 8.7*    COAG Lab Results  Component Value Date   INR 1.0 11/08/2019   INR 1.0 09/11/2019   INR 1.1 08/23/2019   No results found for: PTT

## 2019-11-14 NOTE — Progress Notes (Signed)
Physical Therapy Treatment Patient Details Name: Katherine Archer MRN: [id number] DOB: Dec 30, 1955 Today's Date: 11/14/2019    History of Present Illness Katherine Archer is a 64 y.o. female with a hx of carotid disease, CAD on coronary CTA, HTN, HLD, hx of CVA (currently active with Outpatient Therapies), + tobacco use who is now s/p abdominal aortogram W/ BLE Runoff    PT Comments    Pt to d/c home today.  Peforrmed tx to review stair training to prepare for safe entry into home.  Pt tolerated session well.    Follow Up Recommendations  Home health PT     Equipment Recommendations  None recommended by PT    Recommendations for Other Services       Precautions / Restrictions Precautions Precautions: Fall Precaution Comments: watch BP Restrictions Weight Bearing Restrictions: No    Mobility  Bed Mobility               General bed mobility comments: Pt seated in recliner on arrival.  Transfers Overall transfer level: Modified independent                  Ambulation/Gait Ambulation/Gait assistance: Supervision;Min guard Gait Distance (Feet): 380 Feet Assistive device: Rolling walker (2 wheeled) Gait Pattern/deviations: Step-through pattern;Decreased dorsiflexion - right;Trunk flexed     General Gait Details: Cues for adjustment from R plantar flexion to foot flat as she is unable to dorsiflex on R side, Pt with callusess on bottom of feet which makes it painful to walk.   Stairs Stairs: Yes Stairs assistance: Min guard Stair Management: One rail Right;One rail Left;Forwards Number of Stairs: 6 General stair comments: Cues for sequencing and safety.   Wheelchair Mobility    Modified Rankin (Stroke Patients Only)       Balance                                            Cognition Arousal/Alertness: Awake/alert Behavior During Therapy: WFL for tasks assessed/performed Overall Cognitive Status: Within Functional Limits for tasks  assessed                                 General Comments: Used graciela - in house interpreter for interpretation during session.      Exercises      General Comments        Pertinent Vitals/Pain Pain Assessment: 0-10 Pain Score: 2  Pain Location: pt stating more numbness than pain in R LE, also reports pain in B feet. Pain Descriptors / Indicators: Discomfort;Sore Pain Intervention(s): Monitored during session;Limited activity within patient's tolerance    Home Living                      Prior Function            PT Goals (current goals can now be found in the care plan section) Acute Rehab PT Goals Patient Stated Goal: get better and get home Potential to Achieve Goals: Good Progress towards PT goals: Progressing toward goals    Frequency    Min 3X/week      PT Plan Current plan remains appropriate    Co-evaluation              AM-PAC PT "6 Clicks" Mobility   Outcome Measure  Help needed turning  from your back to your side while in a flat bed without using bedrails?: None Help needed moving from lying on your back to sitting on the side of a flat bed without using bedrails?: None Help needed moving to and from a bed to a chair (including a wheelchair)?: None Help needed standing up from a chair using your arms (e.g., wheelchair or bedside chair)?: None Help needed to walk in hospital room?: None Help needed climbing 3-5 steps with a railing? : A Little 6 Click Score: 23    End of Session Equipment Utilized During Treatment: Gait belt Activity Tolerance: Patient tolerated treatment well Patient left: in chair;with call bell/phone within reach Nurse Communication: Mobility status PT Visit Diagnosis: Unsteadiness on feet (R26.81);Other abnormalities of gait and mobility (R26.89)     Time: 0737-1062 PT Time Calculation (min) (ACUTE ONLY): 24 min  Charges:  $Gait Training: 23-37 mins                     Katherine Archer ,  PTA Acute Rehabilitation Services Pager [phone] Office [phone]     Lee Kuang Eli Hose 11/14/2019, 1:49 PM

## 2019-11-18 ENCOUNTER — Other Ambulatory Visit: Payer: Self-pay | Admitting: Physician Assistant

## 2019-11-18 DIAGNOSIS — Z7689 Persons encountering health services in other specified circumstances: Secondary | ICD-10-CM | POA: Diagnosis not present

## 2019-11-18 DIAGNOSIS — Z681 Body mass index (BMI) 19 or less, adult: Secondary | ICD-10-CM | POA: Diagnosis not present

## 2019-11-18 DIAGNOSIS — I69354 Hemiplegia and hemiparesis following cerebral infarction affecting left non-dominant side: Secondary | ICD-10-CM | POA: Diagnosis not present

## 2019-11-18 DIAGNOSIS — I739 Peripheral vascular disease, unspecified: Secondary | ICD-10-CM | POA: Diagnosis not present

## 2019-11-18 NOTE — Discharge Summary (Signed)
[facility] Discharge Summary   Patient ID:  Katherine Archer MRN: [id number] DOB/AGE: [birth year] 64 y.o.  Admit date: 11/11/2019 Discharge date: 11/14/19 Date of Surgery: 11/11/2019 Surgeon: Surgeon(s): Fields, Jessy Oto, MD Cherre Robins, MD  Admission Diagnosis: PAD (peripheral artery disease) [facility]) [I73.9]  Discharge Diagnoses:  PAD (peripheral artery disease) ([place]) [I73.9]  Secondary Diagnoses: Past Medical History:  Diagnosis Date  . Cervical polyp 09/18/2018  . Encounter for screening mammogram for breast cancer 05/05/2016  . Fibrocystic breast changes of both breasts 04/29/2015  . Hyperlipidemia   . Hypertension   . PAD (peripheral artery disease) ([place]) 08/23/2019  . Stroke ([place]) 08/23/2019    Procedure(s): BYPASS GRAFT RIGHT AXILLA-BIFEMORAL ENDARTERECTOMY FEMORAL BILATERALLY  Discharged Condition: stable  HPI: 64 y/o female with rest pain right foot early rest pain left foot at risk for limb loss without revascularization. Despite having recent stroke I believe she needs an intervention sooner rather than later to prevent her from limb loss or worsening symptoms in her right leg.  Plan: Right axillary bifemoral bypasswith bilateral common femoral endarterectomytoday.   Hospital Course:  Katherine Archer is a 64 y.o. female is S/P  Procedure(s): BYPASS GRAFT RIGHT AXILLA-BIFEMORAL ENDARTERECTOMY FEMORAL BILATERALLY  Post op cardiology consult for hypertension.  Normal Echo was completed.  Trop negative.  Recommend avoiding QT prolonging medications and out patient f/u with Cardiology was arranged.  Patent axillary bypass and fe-fem bypass palpable with healing incisions.   Pt states right foot pain is improved will need to watch over next few months to see if she needs fem pop.  Incisions are healing well and she is ambulating with a rolling walker.  HHPT ordered at discharge.      Consults:  Treatment Team:  Lbcardiology, Rounding,  MD  Significant Diagnostic Studies: CBC Lab Results  Component Value Date   WBC 6.0 11/14/2019   HGB 10.0 (L) 11/14/2019   HCT 30.9 (L) 11/14/2019   MCV 96.0 11/14/2019   PLT 148 (L) 11/14/2019    BMET    Component Value Date/Time   NA 141 11/12/2019 0046   NA 136 09/02/2019 1545   K 3.8 11/12/2019 0046   CL 109 11/12/2019 0046   CO2 24 11/12/2019 0046   GLUCOSE 118 (H) 11/12/2019 0046   BUN 6 (L) 11/12/2019 0046   BUN 14 09/02/2019 1545   CREATININE 0.63 11/12/2019 0046   CALCIUM 8.7 (L) 11/12/2019 0046   GFRNONAA >60 11/12/2019 0046   GFRAA >60 09/11/2019 1202   COAG Lab Results  Component Value Date   INR 1.0 11/08/2019   INR 1.0 09/11/2019   INR 1.1 08/23/2019     Disposition:  Discharge to :Home Discharge Instructions    Call MD for:  redness, tenderness, or signs of infection (pain, swelling, bleeding, redness, odor or green/yellow discharge around incision site)   Complete by: As directed    Call MD for:  severe or increased pain, loss or decreased feeling  in affected limb(s)   Complete by: As directed    Call MD for:  temperature >100.5   Complete by: As directed    Discharge instructions   Complete by: As directed    You may shower daily as needed   Resume previous diet   Complete by: As directed      Allergies as of 11/14/2019      Reactions   Amoxicillin Other (See Comments)   Skin infection on face   Penicillins Other (See Comments)  Shaky and Dizziness 20 years ago      Medication List    TAKE these medications   aspirin 81 MG EC tablet Take 81 mg by mouth daily.   hydrALAZINE 25 MG tablet Commonly known as: APRESOLINE Take 1 tablet (25 mg total) by mouth 3 (three) times daily. What changed:   medication strength  how much to take   hydrochlorothiazide 25 MG tablet Commonly known as: HYDRODIURIL Take 25 mg by mouth daily.   oxyCODONE-acetaminophen 5-325 MG tablet Commonly known as: PERCOCET/ROXICET Take 1 tablet by  mouth every 6 (six) hours as needed for moderate pain.      Verbal and written Discharge instructions given to the patient. Wound care per Discharge AVS  Follow-up Information    Katherine Dutch, MD Follow up in 3 week(s).   Specialties: Vascular Surgery, Cardiology Contact information: [address] [place] [address] [phone]               Signed: Roxy Horseman 11/18/2019, 11:48 AM - For VQI Registry use --- Instructions: Press F2 to tab through selections.  Delete question if not applicable.   Post-op:  Time to Extubation: [x ] In OR, [ ]  <12 hrs, [ ]  12-24 hrs, [ ]  > 24 hrs Vasopressors: No  ICU Stay:  1 days  Transfusion: No  If yes, 0 units given New Arrhythmia: No Ipsilateral amputation: [x ] no, [ ]  Minor, [ ]  BKA, [ ]  AKA Discharge patency: [ ]  Primary, [x ] Primary assisted, [ ]  Secondary, [ ]  Occluded Patency judged by: [x ] Dopper only, [ ]  Palpable graft pulse, [ ]  Palpable distal pulse, [ ]  ABI inc. > 0.15, [ ]  Duplex Discharge ABI: R 0.34, L 0.62 Discharge TBI: R 0.12, L 0.28 D/C Ambulatory Status: Ambulatory with Assistance  Complications: Wound complication: [x ] No, [ ]  Superficial, [ ]  Return to OR  Graft infection: No  Leg ischemia/emboli: x[ ]  No, [ ]  Yes, no Surgery, [ ]  Yes, Surgery req., [ ]  Amputation If amputation: side: [ ]  R: [ ]  minor, [ ]  BKA, [ ]  AKA; [ ]  L: [ ]  Minor, [ ]  BKA, [ ]  AKA  MI: [x ] No, [ ]  Troponin only, [ ]  EKG or Clinical CHF: No Resp failure: [x ] none, [ ]  Pneumonia, [ ]  Ventilator Chg in renal function: [x ] none, [ ]  Inc. Cr > 0.5, [ ]  Temp. Dialysis, [ ]  Permanent dialysis Stroke: [x ] None, [ ]  Minor, [ ]  Major Return to OR: No  Reason for return to OR: [ ]  Bleeding, [ ]  Infection, [ ]  Thrombosis, [ ]  Revision  Discharge medications: Statin use:  YES ASA use:  Yes Plavix use:  yes Beta blocker use: Yes Coumadin use: No  for medical reason not indicated

## 2019-11-21 ENCOUNTER — Telehealth: Payer: Self-pay

## 2019-11-21 NOTE — Telephone Encounter (Signed)
Daughter left VM concerned about patient's R leg. Attempted to call back @ 1456 - no answer, unable to leave VM

## 2019-11-25 ENCOUNTER — Telehealth: Payer: Self-pay | Admitting: *Deleted

## 2019-11-25 NOTE — Telephone Encounter (Signed)
Spoke with daughter. She is concerned about increased swelling and pain in right leg s/p ax-bifem, fem fem with Dr. Oneida Alar. Elevating the affected leg helps, but pt is unable to keep leg elevated for very long. Pt is decreasing in mobility since discharge from hospital. Pt scheduled for PA visit tomorrow.

## 2019-11-26 ENCOUNTER — Other Ambulatory Visit: Payer: Self-pay

## 2019-11-26 ENCOUNTER — Ambulatory Visit (INDEPENDENT_AMBULATORY_CARE_PROVIDER_SITE_OTHER): Payer: Self-pay | Admitting: Physician Assistant

## 2019-11-26 VITALS — BP 163/71 | HR 57 | Temp 99.2°F | Wt 122.5 lb

## 2019-11-26 DIAGNOSIS — M79673 Pain in unspecified foot: Secondary | ICD-10-CM

## 2019-11-26 DIAGNOSIS — I739 Peripheral vascular disease, unspecified: Secondary | ICD-10-CM

## 2019-11-26 MED ORDER — ONDANSETRON HCL 4 MG PO TABS: 4.0000 mg | ORAL_TABLET | Freq: Three times a day (TID) | ORAL | 0 refills | Status: DC | PRN

## 2019-11-26 MED ORDER — OXYCODONE-ACETAMINOPHEN 5-325 MG PO TABS
1.0000 | ORAL_TABLET | ORAL | 0 refills | Status: DC | PRN
Start: 2019-11-26 — End: 2020-04-16

## 2019-11-26 NOTE — Progress Notes (Signed)
  POST OPERATIVE OFFICE NOTE    CC:  F/u for surgery>2 week post-op  HPI:  This is a 64 y.o. female who is s/p right axillary to bifemoral bypass with bilateral common femoral endarterectomy secondary to bilateral rest pain in her feet.  This was performed on November 11, 2019 by Dr. Oneida Alar. She was given an appointment today after calling yesterday c/o significant RLE swelling and increased pain.  The patient is Spanish-speaking and her daughter accompanies her today.  Her daughter is fluent in [place].  She states she is having right foot pain. She is ambulating with a rolling walker. The pain awakens her at night and she is taking her post-op Percocet.  This is causing nausea.  She denies fever or chills.  Allergies  Allergen Reactions  . Amoxicillin Other (See Comments)    Skin infection on face  . Penicillins Other (See Comments)    Shaky and Dizziness 20 years ago    Current Outpatient Medications  Medication Sig Dispense Refill  . aspirin 81 MG EC tablet Take 81 mg by mouth daily.    Marland Kitchen atorvastatin (LIPITOR) 80 MG tablet TAKE 1 TABLET (80 MG TOTAL) BY MOUTH DAILY AT 6 PM 90 tablet 2  . hydrALAZINE (APRESOLINE) 25 MG tablet Take 1 tablet (25 mg total) by mouth 3 (three) times daily. 90 tablet 1  . hydrochlorothiazide (HYDRODIURIL) 25 MG tablet Take 25 mg by mouth daily.    Marland Kitchen oxyCODONE-acetaminophen (PERCOCET/ROXICET) 5-325 MG tablet Take 1 tablet by mouth every 6 (six) hours as needed for moderate pain. 30 tablet 0   No current facility-administered medications for this visit.     ROS:  See HPI  Vitals:   11/26/19 0938  BP: (!) 163/71  Pulse: (!) 57  Temp: 99.2 F (37.3 C)  SpO2: 99%   Physical Exam:  General appearance: Well-developed, well-nourished in no apparent distress Cardiac: Rate and rhythm are regular Respiratory: Nonlabored Incision: Right upper anterior chest incision and both groin incisions are well approximated.  She has some mild edema which is soft  of the right groin.  There is no drainage. Extremities: There is dusky appearance to the right fourth fourth toe with a tiny skin ulcer.   She has brisk dorsalis pedis, posterior tibial and peroneal Doppler signals.  Incision and motor function are intact.  No appreciable edema. She has a palpable pulse in the axillary limb as well as the femoral to femoral limb of her bypass.       ABI Right 0.34 Left 0.6 compared to 0.2 and 0.4 preop   Assessment/Plan:  This is a 64 y.o. female who is s/p: Right axillary bifemoral bypass with bilateral femoral endarterectomies.  The patient's bypass is patent and she has Doppler signals in her right foot.  She does have some skin changes noted of the right 4th toe.  I believe she has an element of rest pain.  Continue statin and aspirin.  I will phone in antiemetic to take along with her pain medication.  As per Dr. Oneida Alar' note, she may need RLE revascularization. Mild edema of the right groin.  This is soft and does not appear infected.  I advised the patient and her daughter to monitor this and to call our office should it drain, turn red, or if she develops increased pain or fever.   She has a follow-up appointment on December 05, 2019.   Risa Grill, PA-C [facility] [phone]  Clinic MD: Carlis Abbott

## 2019-11-30 DIAGNOSIS — I739 Peripheral vascular disease, unspecified: Secondary | ICD-10-CM | POA: Diagnosis not present

## 2019-11-30 DIAGNOSIS — M810 Age-related osteoporosis without current pathological fracture: Secondary | ICD-10-CM | POA: Diagnosis not present

## 2019-11-30 DIAGNOSIS — I69354 Hemiplegia and hemiparesis following cerebral infarction affecting left non-dominant side: Secondary | ICD-10-CM | POA: Diagnosis not present

## 2019-11-30 DIAGNOSIS — Z48812 Encounter for surgical aftercare following surgery on the circulatory system: Secondary | ICD-10-CM | POA: Diagnosis not present

## 2019-11-30 DIAGNOSIS — Z9181 History of falling: Secondary | ICD-10-CM | POA: Diagnosis not present

## 2019-11-30 DIAGNOSIS — R69 Illness, unspecified: Secondary | ICD-10-CM | POA: Diagnosis not present

## 2019-11-30 DIAGNOSIS — E785 Hyperlipidemia, unspecified: Secondary | ICD-10-CM | POA: Diagnosis not present

## 2019-11-30 DIAGNOSIS — Z7982 Long term (current) use of aspirin: Secondary | ICD-10-CM | POA: Diagnosis not present

## 2019-12-03 DIAGNOSIS — I10 Essential (primary) hypertension: Secondary | ICD-10-CM | POA: Diagnosis not present

## 2019-12-05 ENCOUNTER — Other Ambulatory Visit: Payer: Self-pay

## 2019-12-05 ENCOUNTER — Ambulatory Visit (INDEPENDENT_AMBULATORY_CARE_PROVIDER_SITE_OTHER): Payer: Self-pay | Admitting: Vascular Surgery

## 2019-12-05 VITALS — BP 173/70 | HR 97 | Temp 99.2°F | Resp 20 | Ht 66.0 in | Wt 122.0 lb

## 2019-12-05 DIAGNOSIS — Z9181 History of falling: Secondary | ICD-10-CM | POA: Diagnosis not present

## 2019-12-05 DIAGNOSIS — Z48812 Encounter for surgical aftercare following surgery on the circulatory system: Secondary | ICD-10-CM | POA: Diagnosis not present

## 2019-12-05 DIAGNOSIS — I739 Peripheral vascular disease, unspecified: Secondary | ICD-10-CM | POA: Diagnosis not present

## 2019-12-05 DIAGNOSIS — I69354 Hemiplegia and hemiparesis following cerebral infarction affecting left non-dominant side: Secondary | ICD-10-CM | POA: Diagnosis not present

## 2019-12-05 DIAGNOSIS — M810 Age-related osteoporosis without current pathological fracture: Secondary | ICD-10-CM | POA: Diagnosis not present

## 2019-12-05 DIAGNOSIS — E785 Hyperlipidemia, unspecified: Secondary | ICD-10-CM | POA: Diagnosis not present

## 2019-12-05 DIAGNOSIS — R69 Illness, unspecified: Secondary | ICD-10-CM | POA: Diagnosis not present

## 2019-12-05 DIAGNOSIS — Z7982 Long term (current) use of aspirin: Secondary | ICD-10-CM | POA: Diagnosis not present

## 2019-12-05 NOTE — Progress Notes (Signed)
Patient is a 64 year old female who returns for postoperative follow-up today.  She underwent right axillary bifemoral bypass on November 11, 2019 for rest pain primarily in the right foot but with some symptoms in the left foot as well.  She states that the pain symptoms in her right foot have significantly improved.  She has no symptoms in her left foot.  Patient does not speak much English and her daughter was used for assistance with interpretation today.  She has no incisional drainage.  Postoperative ABIs in the hospital were 0.34 on the right and 0.62 on the left.  This is compared to 0.2 and 0.4 respectively preop.  She also has actually been able to heal wounds on her right foot recently.  She had an abrasion on the dorsum of her right foot which healed spontaneously.  Physical exam:  Vitals:   12/05/19 1515  BP: (!) 173/70  Pulse: 97  Resp: 20  Temp: 99.2 F (37.3 C)  SpO2: 92%  Weight: 122 lb (55.3 kg)  Height: 5\' 6"  (1.676 m)    Abdomen: Easily palpable axillary femoral bypass graft pulse on the right abdominal and chest wall, easily palpable femoral-femoral bypass graft over the pelvis, 2+ femoral pulses, absent pedal pulses, feet warm bilaterally  Assessment: Patent axillary bifemoral bypass.  Patient has improved symptoms in both legs but may still have some mild symptoms of pain in the right foot.  I discussed with the patient and the daughter today that we will give this a few more months but may need to consider further intervention in the right leg if her symptoms do not completely resolve.  Plan: Patient will have a follow-up duplex of her axbifem bypass as well as bilateral ABIs in 3 months time.  She will follow up in our APP clinic.  Ruta Hinds, MD [facility] [place] Office: [phone]

## 2019-12-09 ENCOUNTER — Other Ambulatory Visit: Payer: Self-pay

## 2019-12-09 DIAGNOSIS — I739 Peripheral vascular disease, unspecified: Secondary | ICD-10-CM

## 2019-12-10 DIAGNOSIS — R69 Illness, unspecified: Secondary | ICD-10-CM | POA: Diagnosis not present

## 2019-12-10 DIAGNOSIS — M810 Age-related osteoporosis without current pathological fracture: Secondary | ICD-10-CM | POA: Diagnosis not present

## 2019-12-10 DIAGNOSIS — Z9181 History of falling: Secondary | ICD-10-CM | POA: Diagnosis not present

## 2019-12-10 DIAGNOSIS — E785 Hyperlipidemia, unspecified: Secondary | ICD-10-CM | POA: Diagnosis not present

## 2019-12-10 DIAGNOSIS — Z48812 Encounter for surgical aftercare following surgery on the circulatory system: Secondary | ICD-10-CM | POA: Diagnosis not present

## 2019-12-10 DIAGNOSIS — I739 Peripheral vascular disease, unspecified: Secondary | ICD-10-CM | POA: Diagnosis not present

## 2019-12-10 DIAGNOSIS — I69354 Hemiplegia and hemiparesis following cerebral infarction affecting left non-dominant side: Secondary | ICD-10-CM | POA: Diagnosis not present

## 2019-12-10 DIAGNOSIS — Z7982 Long term (current) use of aspirin: Secondary | ICD-10-CM | POA: Diagnosis not present

## 2019-12-12 DIAGNOSIS — Z9181 History of falling: Secondary | ICD-10-CM | POA: Diagnosis not present

## 2019-12-12 DIAGNOSIS — I739 Peripheral vascular disease, unspecified: Secondary | ICD-10-CM | POA: Diagnosis not present

## 2019-12-12 DIAGNOSIS — Z7982 Long term (current) use of aspirin: Secondary | ICD-10-CM | POA: Diagnosis not present

## 2019-12-12 DIAGNOSIS — E785 Hyperlipidemia, unspecified: Secondary | ICD-10-CM | POA: Diagnosis not present

## 2019-12-12 DIAGNOSIS — M810 Age-related osteoporosis without current pathological fracture: Secondary | ICD-10-CM | POA: Diagnosis not present

## 2019-12-12 DIAGNOSIS — I69354 Hemiplegia and hemiparesis following cerebral infarction affecting left non-dominant side: Secondary | ICD-10-CM | POA: Diagnosis not present

## 2019-12-12 DIAGNOSIS — R69 Illness, unspecified: Secondary | ICD-10-CM | POA: Diagnosis not present

## 2019-12-12 DIAGNOSIS — Z48812 Encounter for surgical aftercare following surgery on the circulatory system: Secondary | ICD-10-CM | POA: Diagnosis not present

## 2019-12-16 DIAGNOSIS — M810 Age-related osteoporosis without current pathological fracture: Secondary | ICD-10-CM | POA: Diagnosis not present

## 2019-12-16 DIAGNOSIS — E785 Hyperlipidemia, unspecified: Secondary | ICD-10-CM | POA: Diagnosis not present

## 2019-12-16 DIAGNOSIS — I69354 Hemiplegia and hemiparesis following cerebral infarction affecting left non-dominant side: Secondary | ICD-10-CM | POA: Diagnosis not present

## 2019-12-16 DIAGNOSIS — R69 Illness, unspecified: Secondary | ICD-10-CM | POA: Diagnosis not present

## 2019-12-16 DIAGNOSIS — Z48812 Encounter for surgical aftercare following surgery on the circulatory system: Secondary | ICD-10-CM | POA: Diagnosis not present

## 2019-12-16 DIAGNOSIS — I739 Peripheral vascular disease, unspecified: Secondary | ICD-10-CM | POA: Diagnosis not present

## 2019-12-16 DIAGNOSIS — Z7982 Long term (current) use of aspirin: Secondary | ICD-10-CM | POA: Diagnosis not present

## 2019-12-16 DIAGNOSIS — Z9181 History of falling: Secondary | ICD-10-CM | POA: Diagnosis not present

## 2019-12-18 DIAGNOSIS — Z9181 History of falling: Secondary | ICD-10-CM | POA: Diagnosis not present

## 2019-12-18 DIAGNOSIS — Z7982 Long term (current) use of aspirin: Secondary | ICD-10-CM | POA: Diagnosis not present

## 2019-12-18 DIAGNOSIS — E785 Hyperlipidemia, unspecified: Secondary | ICD-10-CM | POA: Diagnosis not present

## 2019-12-18 DIAGNOSIS — Z48812 Encounter for surgical aftercare following surgery on the circulatory system: Secondary | ICD-10-CM | POA: Diagnosis not present

## 2019-12-18 DIAGNOSIS — M810 Age-related osteoporosis without current pathological fracture: Secondary | ICD-10-CM | POA: Diagnosis not present

## 2019-12-18 DIAGNOSIS — R69 Illness, unspecified: Secondary | ICD-10-CM | POA: Diagnosis not present

## 2019-12-18 DIAGNOSIS — I739 Peripheral vascular disease, unspecified: Secondary | ICD-10-CM | POA: Diagnosis not present

## 2019-12-18 DIAGNOSIS — I69354 Hemiplegia and hemiparesis following cerebral infarction affecting left non-dominant side: Secondary | ICD-10-CM | POA: Diagnosis not present

## 2019-12-23 ENCOUNTER — Telehealth: Payer: Self-pay

## 2019-12-23 NOTE — Telephone Encounter (Signed)
Patient's daughter left VM - patient is having higher than normal blood pressure and swelling. Attempted to call back both numbers on file, UTR could not leave VM.

## 2019-12-25 DIAGNOSIS — E785 Hyperlipidemia, unspecified: Secondary | ICD-10-CM | POA: Diagnosis not present

## 2019-12-25 DIAGNOSIS — M810 Age-related osteoporosis without current pathological fracture: Secondary | ICD-10-CM | POA: Diagnosis not present

## 2019-12-25 DIAGNOSIS — Z7982 Long term (current) use of aspirin: Secondary | ICD-10-CM | POA: Diagnosis not present

## 2019-12-25 DIAGNOSIS — I69354 Hemiplegia and hemiparesis following cerebral infarction affecting left non-dominant side: Secondary | ICD-10-CM | POA: Diagnosis not present

## 2019-12-25 DIAGNOSIS — I739 Peripheral vascular disease, unspecified: Secondary | ICD-10-CM | POA: Diagnosis not present

## 2019-12-25 DIAGNOSIS — R69 Illness, unspecified: Secondary | ICD-10-CM | POA: Diagnosis not present

## 2019-12-25 DIAGNOSIS — Z48812 Encounter for surgical aftercare following surgery on the circulatory system: Secondary | ICD-10-CM | POA: Diagnosis not present

## 2019-12-25 DIAGNOSIS — Z9181 History of falling: Secondary | ICD-10-CM | POA: Diagnosis not present

## 2020-01-07 ENCOUNTER — Other Ambulatory Visit: Payer: Self-pay

## 2020-01-09 ENCOUNTER — Ambulatory Visit: Payer: Medicare HMO | Admitting: Cardiology

## 2020-01-15 ENCOUNTER — Encounter: Payer: Self-pay | Admitting: Cardiology

## 2020-01-15 ENCOUNTER — Other Ambulatory Visit: Payer: Self-pay

## 2020-01-15 ENCOUNTER — Ambulatory Visit (INDEPENDENT_AMBULATORY_CARE_PROVIDER_SITE_OTHER): Payer: Medicare HMO | Admitting: Cardiology

## 2020-01-15 VITALS — BP 162/80 | HR 90 | Ht 66.0 in | Wt 120.8 lb

## 2020-01-15 DIAGNOSIS — I2510 Atherosclerotic heart disease of native coronary artery without angina pectoris: Secondary | ICD-10-CM | POA: Diagnosis not present

## 2020-01-15 DIAGNOSIS — I739 Peripheral vascular disease, unspecified: Secondary | ICD-10-CM

## 2020-01-15 DIAGNOSIS — E782 Mixed hyperlipidemia: Secondary | ICD-10-CM | POA: Diagnosis not present

## 2020-01-15 DIAGNOSIS — I10 Essential (primary) hypertension: Secondary | ICD-10-CM

## 2020-01-15 DIAGNOSIS — R040 Epistaxis: Secondary | ICD-10-CM

## 2020-01-15 MED ORDER — AMLODIPINE BESYLATE 5 MG PO TABS: 5.0000 mg | ORAL_TABLET | Freq: Every day | ORAL | 3 refills | Status: DC

## 2020-01-15 NOTE — Progress Notes (Signed)
Cardiology Office Note:    Date:  01/15/2020   ID:  Katherine Archer, DOB 05-17-1955, MRN [id number]  PCP:  [name], MD  Cardiologist:  Berniece Salines, DO  Electrophysiologist:  None   Referring MD: [name], MD   " I am having fine- but sometime I have some noseble  History of Present Illness:    Katherine Archer is a 65 y.o. female with a hx of coronary artery disease recently seen on coronary CTA, carotid disease, coronary disease recently seen on a coronary CTA, hypertension, hyperlipidemia, history of stroke comes today for follow-up visit.  When I last saw the patient she was preparing for peripheral artery disease bypass grafting surgery.  She did get the surgery on November 11, 2019.  She is here today for follow-up visit.  Today she tells me that she has recovered well from her peripheral artery disease surgery.  She even has not needed the walker or cane since she is walking much better now.  The only issue that she brings office the fact that intermittently she gets some nosebleeds and they feel more like clots.  She tells me this happened to her many years ago back in 1998 she had a similar issue where she also had headaches she did see neurosurgeon at the time she had a procedure done which helped this and problem was practically resolved.  She does have an appointment coming up with neurosurgery she tells me as she follows up with them periodically this is on February 10.  Past Medical History:  Diagnosis Date  . Cervical polyp 09/18/2018  . Encounter for screening mammogram for breast cancer 05/05/2016  . Fibrocystic breast changes of both breasts 04/29/2015  . Hyperlipidemia   . Hypertension   . PAD (peripheral artery disease) ([place]) 08/23/2019  . Stroke [facility]) 08/23/2019    Past Surgical History:  Procedure Laterality Date  . ABDOMINAL AORTOGRAM W/LOWER EXTREMITY Bilateral 08/23/2019   Procedure: ABDOMINAL AORTOGRAM W/ Bilateral LOWER EXTREMITY [place];  Surgeon:  [name], MD;  Location: [facility];  Service: Cardiovascular;  Laterality: Bilateral;  . AXILLARY-FEMORAL BYPASS GRAFT Right 11/11/2019   Procedure: BYPASS GRAFT RIGHT AXILLA-BIFEMORAL;  Surgeon: [name], MD;  Location: [facility];  Service: Vascular;  Laterality: Right;  . BRAIN SURGERY    . BREAST SURGERY Bilateral 2014   1980 R breast surgery  . CEREBRAL ANEURYSM REPAIR    . ENDARTERECTOMY FEMORAL Bilateral 11/11/2019   Procedure: ENDARTERECTOMY FEMORAL BILATERALLY;  Surgeon: [name], MD;  [facility];  Service: Vascular;  Laterality: Bilateral;  . TUBAL LIGATION  1990    Current Medications: Current Meds  Medication Sig  . amLODipine (NORVASC) 5 MG tablet Take 1 tablet (5 mg total) by mouth daily.  Marland Kitchen aspirin 81 MG EC tablet Take 81 mg by mouth daily.  Marland Kitchen atorvastatin (LIPITOR) 80 MG tablet TAKE 1 TABLET (80 MG TOTAL) BY MOUTH DAILY AT 6 PM  . hydrALAZINE (APRESOLINE) 25 MG tablet Take 1 tablet (25 mg total) by mouth 3 (three) times daily.  . hydrochlorothiazide (HYDRODIURIL) 25 MG tablet Take 25 mg by mouth daily.  . ondansetron (ZOFRAN) 4 MG tablet Take 1 tablet (4 mg total) by mouth every 8 (eight) hours as needed for nausea or vomiting.  Marland Kitchen oxyCODONE-acetaminophen (PERCOCET/ROXICET) 5-325 MG tablet Take 1 tablet by mouth every 4 (four) hours as needed for severe pain.     Allergies:   Amoxicillin and Penicillins   Social History  Socioeconomic History  . Marital status: Divorced    Spouse name: Not on file  . Number of children: Not on file  . Years of education: Not on file  . Highest education level: Not on file  Occupational History  . Not on file  Tobacco Use  . Smoking status: Former Smoker    Packs/day: 0.25    Types: Cigarettes    Quit date: 10/04/2019    Years since quitting: 0.2  . Smokeless tobacco: Never Used  Vaping Use  . Vaping Use: Never used  Substance and Sexual Activity  . Alcohol use: No  . Drug use: No  .  Sexual activity: Not Currently  Other Topics Concern  . Not on file  Social History Narrative  . Not on file   Social Determinants of Health   Financial Resource Strain: Not on file  Food Insecurity: Not on file  Transportation Needs: Not on file  Physical Activity: Not on file  Stress: Not on file  Social Connections: Not on file     Family History: The patient's family history includes Heart Problems in her brother, brother, and father.  ROS:   Review of Systems  Constitution: Negative for decreased appetite, fever and weight gain.  HENT: Negative for congestion, ear discharge, hoarse voice and sore throat.   Eyes: Negative for discharge, redness, vision loss in right eye and visual halos.  Cardiovascular: Negative for chest pain, dyspnea on exertion, leg swelling, orthopnea and palpitations.  Respiratory: Negative for cough, hemoptysis, shortness of breath and snoring.   Endocrine: Negative for heat intolerance and polyphagia.  Hematologic/Lymphatic: Negative for bleeding problem. Does not bruise/bleed easily.  Skin: Negative for flushing, nail changes, rash and suspicious lesions.  Musculoskeletal: Negative for arthritis, joint pain, muscle cramps, myalgias, neck pain and stiffness.  Gastrointestinal: Negative for abdominal pain, bowel incontinence, diarrhea and excessive appetite.  Genitourinary: Negative for decreased libido, genital sores and incomplete emptying.  Neurological: Negative for brief paralysis, focal weakness, headaches and loss of balance.  Psychiatric/Behavioral: Negative for altered mental status, depression and suicidal ideas.  Allergic/Immunologic: Negative for HIV exposure and persistent infections.    EKGs/Labs/Other Studies Reviewed:    The following studies were reviewed today:   EKG: None today  CCTA   Aorta: Normal size. Mild aortic root calcifications. No dissection.  Aortic Valve:  Trileaflet.  No calcifications.  Coronary Arteries:   Normal coronary origin.  Right dominance.  RCA is a large dominant artery that gives rise to PDA and PLVB. There is minimal luminal irregularities in the proximal. Stair step artifact in the mid RCA. Mid and distal RCA not well visualized.  Left main is a large artery that gives rise to LAD and LCX arteries.  LAD is a large vessel. Minimal (<24%) calcified plaque in the proximal LAD. Minimal (<24%) soft plaque in the mid LAD. The distal LAD with no plaques. The is mild myocardial bridging in the mid LAD. D1 and D2 with minimal luminal irregularities.  LCX is a non-dominant artery that gives rise to one large OM1 branch. There is minimal (<24%) soft plaque in the mid LCX.  Other findings:  Normal pulmonary vein drainage into the left atrium.  Normal left atrial appendage without a thrombus.  Normal size of the pulmonary artery.  IMPRESSION: 1. Coronary calcium score of 13. This was 8078 percentile for age and sex matched control.  2. Normal coronary origin with right dominance.  3. Minimal CAD. CADRADs 1. Recommend medical therapy for primary  prevention. This study will be send for FFR due the RCA not well visualized in the mid and distal portion for any flow limiting lesions.  Berniece Salines, DO    Recent Labs: 11/08/2019: ALT 19 11/12/2019: BUN 6; Creatinine, Ser 0.63; Potassium 3.8; Sodium 141 11/14/2019: Hemoglobin 10.0; Platelets 148  Recent Lipid Panel    Component Value Date/Time   CHOL 118 11/12/2019 0046   TRIG 49 11/12/2019 0046   HDL 36 (L) 11/12/2019 0046   CHOLHDL 3.3 11/12/2019 0046   VLDL 10 11/12/2019 0046   LDLCALC 72 11/12/2019 0046    Physical Exam:    VS:  BP (!) 162/80   Pulse 90   Ht 5\' 6"  (1.676 m)   Wt 120 lb 12.8 oz (54.8 kg)   SpO2 98%   BMI 19.50 kg/m     Wt Readings from Last 3 Encounters:  01/15/20 120 lb 12.8 oz (54.8 kg)  12/05/19 122 lb (55.3 kg)  11/26/19 122 lb 8 oz (55.6 kg)     GEN: Well nourished, well  developed in no acute distress HEENT: Normal NECK: No JVD; No carotid bruits LYMPHATICS: No lymphadenopathy CARDIAC: S1S2 noted,RRR, no murmurs, rubs, gallops RESPIRATORY:  Clear to auscultation without rales, wheezing or rhonchi  ABDOMEN: Soft, non-tender, non-distended, +bowel sounds, no guarding. EXTREMITIES: No edema, No cyanosis, no clubbing MUSCULOSKELETAL:  No deformity  SKIN: Warm and dry NEUROLOGIC:  Alert and oriented x 3, non-focal PSYCHIATRIC:  Normal affect, good insight  ASSESSMENT:    1. Essential hypertension   2. Nosebleed   3. PAD (peripheral artery disease) ([place])   4. Coronary artery disease involving native coronary artery of native heart without angina pectoris   5. Mixed hyperlipidemia    PLAN:     Her blood pressure is elevated in the office today she currently takes hydralazine 25 mg every 8 hours and hydrochlorothiazide 25 mg daily.  She does not have any allergies to any antihypertensive medication therefore minute at amlodipine 5 mg daily her goal blood pressure is less than 130/80.  With her nosebleed and no antiplatelets I will refer the patient to ENT for proper evaluation to make sure that there is no arterial bleeding.  In the meantime we will going to do some blood work which includes CBC, BMP and mag.  Hyperlipidemia - continue with current statin medication.  She denies any anginal symptoms or coronary artery disease continue her current regimen.  The patient is in agreement with the above plan. The patient left the office in stable condition.  The patient will follow up in 4 weeks due to medication change.   Medication Adjustments/Labs and Tests Ordered: Current medicines are reviewed at length with the patient today.  Concerns regarding medicines are outlined above.  Orders Placed This Encounter  Procedures  . Basic metabolic panel  . Magnesium  . CBC  . Ambulatory referral to ENT   Meds ordered this encounter  Medications  .  amLODipine (NORVASC) 5 MG tablet    Sig: Take 1 tablet (5 mg total) by mouth daily.    Dispense:  90 tablet    Refill:  3    Patient Instructions  Medication Instructions:  Your physician has recommended you make the following change in your medication:  START: Amlodipine 5 mg take one tablet by mouth daily.  *If you need a refill on your cardiac medications before your next appointment, please call your pharmacy*   Lab Work: Your physician recommends that you return for  lab work in: TODAY BMP, CBC, [place] If you have labs (blood work) drawn today and your tests are completely normal, you will receive your results only by: Marland Kitchen MyChart Message (if you have MyChart) OR . A paper copy in the mail If you have any lab test that is abnormal or we need to change your treatment, we will call you to review the results.   Testing/Procedures: None   Follow-Up: At [facility], you and your health needs are our priority.  As part of our continuing mission to provide you with exceptional heart care, we have created designated Provider Care Teams.  These Care Teams include your primary Cardiologist (physician) and Advanced Practice Providers (APPs -  Physician Assistants and Nurse Practitioners) who all work together to provide you with the care you need, when you need it.  We recommend signing up for the patient portal called "MyChart".  Sign up information is provided on this After Visit Summary.  MyChart is used to connect with patients for Virtual Visits (Telemedicine).  Patients are able to view lab/test results, encounter notes, upcoming appointments, etc.  Non-urgent messages can be sent to your provider as well.   To learn more about what you can do with MyChart, go to [email or web address].    Your next appointment:   4 week(s)  The format for your next appointment:   In Person  Provider:   Berniece Salines, DO   Other Instructions      Adopting a Healthy Lifestyle.  Know what a  healthy weight is for you (roughly BMI <25) and aim to maintain this   Aim for 7+ servings of fruits and vegetables daily   65-80+ fluid ounces of water or unsweet tea for healthy kidneys   Limit to max 1 drink of alcohol per day; avoid smoking/tobacco   Limit animal fats in diet for cholesterol and heart health - choose grass fed whenever available   Avoid highly processed foods, and foods high in saturated/trans fats   Aim for low stress - take time to unwind and care for your mental health   Aim for 150 min of moderate intensity exercise weekly for heart health, and weights twice weekly for bone health   Aim for 7-9 hours of sleep daily   When it comes to diets, agreement about the perfect plan isnt easy to find, even among the experts. Experts at the [employer] developed an idea known as the Healthy Eating Plate. Just imagine a plate divided into logical, healthy portions.   The emphasis is on diet quality:   Load up on vegetables and fruits - one-half of your plate: Aim for color and variety, and remember that potatoes dont count.   Go for whole grains - one-quarter of your plate: Whole wheat, barley, wheat berries, quinoa, oats, brown rice, and foods made with them. If you want pasta, go with whole wheat pasta.   Protein power - one-quarter of your plate: Fish, chicken, beans, and nuts are all healthy, versatile protein sources. Limit red meat.   The diet, however, does go beyond the plate, offering a few other suggestions.   Use healthy plant oils, such as olive, canola, soy, corn, sunflower and peanut. Check the labels, and avoid partially hydrogenated oil, which have unhealthy trans fats.   If youre thirsty, drink water. Coffee and tea are good in moderation, but skip sugary drinks and limit milk and dairy products to one or two daily servings.   The  type of carbohydrate in the diet is more important than the amount. Some sources of carbohydrates,  such as vegetables, fruits, whole grains, and beans-are healthier than others.   Finally, stay active  Signed, Berniece Salines, DO  01/15/2020 11:33 AM    [place]

## 2020-01-15 NOTE — Patient Instructions (Signed)
Medication Instructions:  Your physician has recommended you make the following change in your medication:  START: Amlodipine 5 mg take one tablet by mouth daily.  *If you need a refill on your cardiac medications before your next appointment, please call your pharmacy*   Lab Work: Your physician recommends that you return for lab work in: TODAY BMP, CBC, Mag If you have labs (blood work) drawn today and your tests are completely normal, you will receive your results only by: Marland Kitchen MyChart Message (if you have MyChart) OR . A paper copy in the mail If you have any lab test that is abnormal or we need to change your treatment, we will call you to review the results.   Testing/Procedures: None   Follow-Up: At [facility], you and your health needs are our priority.  As part of our continuing mission to provide you with exceptional heart care, we have created designated Provider Care Teams.  These Care Teams include your primary Cardiologist (physician) and Advanced Practice Providers (APPs -  Physician Assistants and Nurse Practitioners) who all work together to provide you with the care you need, when you need it.  We recommend signing up for the patient portal called "MyChart".  Sign up information is provided on this After Visit Summary.  MyChart is used to connect with patients for Virtual Visits (Telemedicine).  Patients are able to view lab/test results, encounter notes, upcoming appointments, etc.  Non-urgent messages can be sent to your provider as well.   To learn more about what you can do with MyChart, go to [email or web address].    Your next appointment:   4 week(s)  The format for your next appointment:   In Person  Provider:   Berniece Salines, DO   Other Instructions

## 2020-01-16 ENCOUNTER — Telehealth: Payer: Self-pay

## 2020-01-16 LAB — BASIC METABOLIC PANEL
BUN/Creatinine Ratio: 9 — ABNORMAL LOW (ref 12–28)
BUN: 8 mg/dL (ref 8–27)
CO2: 22 mmol/L (ref 20–29)
Calcium: 10.5 mg/dL — ABNORMAL HIGH (ref 8.7–10.3)
Chloride: 100 mmol/L (ref 96–106)
Creatinine, Ser: 0.89 mg/dL (ref 0.57–1.00)
GFR calc Af Amer: 80 mL/min/{1.73_m2} (ref >59)
GFR calc non Af Amer: 70 mL/min/{1.73_m2} (ref >59)
Glucose: 98 mg/dL (ref 65–99)
Potassium: 4.3 mmol/L (ref 3.5–5.2)
Sodium: 138 mmol/L (ref 134–144)

## 2020-01-16 LAB — CBC
Hematocrit: 44.5 % (ref 34.0–46.6)
Hemoglobin: 14.7 g/dL (ref 11.1–15.9)
MCH: 30.6 pg (ref 26.6–33.0)
MCHC: 33 g/dL (ref 31.5–35.7)
MCV: 93 fL (ref 79–97)
Platelets: 187 10*3/uL (ref 150–450)
RBC: 4.81 x10E6/uL (ref 3.77–5.28)
RDW: 11.4 % — ABNORMAL LOW (ref 11.7–15.4)
WBC: 3.9 10*3/uL (ref 3.4–10.8)

## 2020-01-16 LAB — MAGNESIUM: Magnesium: 2.2 mg/dL (ref 1.6–2.3)

## 2020-01-16 NOTE — Telephone Encounter (Signed)
Patient notified of results and verbalized understanding.  

## 2020-01-16 NOTE — Telephone Encounter (Signed)
-----   Message from Berniece Salines, DO sent at 01/16/2020  8:08 AM EST ----- Labs stable

## 2020-01-27 DIAGNOSIS — I10 Essential (primary) hypertension: Secondary | ICD-10-CM | POA: Diagnosis not present

## 2020-01-27 DIAGNOSIS — Z6820 Body mass index (BMI) 20.0-20.9, adult: Secondary | ICD-10-CM | POA: Diagnosis not present

## 2020-02-03 ENCOUNTER — Ambulatory Visit: Payer: Medicare HMO | Admitting: Adult Health

## 2020-02-03 DIAGNOSIS — I10 Essential (primary) hypertension: Secondary | ICD-10-CM | POA: Diagnosis not present

## 2020-02-12 ENCOUNTER — Other Ambulatory Visit: Payer: Self-pay

## 2020-02-12 ENCOUNTER — Ambulatory Visit (INDEPENDENT_AMBULATORY_CARE_PROVIDER_SITE_OTHER): Payer: Medicare HMO | Admitting: Cardiology

## 2020-02-12 ENCOUNTER — Encounter: Payer: Self-pay | Admitting: Cardiology

## 2020-02-12 VITALS — BP 154/62 | HR 90 | Ht 66.0 in | Wt 122.6 lb

## 2020-02-12 DIAGNOSIS — I739 Peripheral vascular disease, unspecified: Secondary | ICD-10-CM

## 2020-02-12 DIAGNOSIS — E782 Mixed hyperlipidemia: Secondary | ICD-10-CM | POA: Diagnosis not present

## 2020-02-12 DIAGNOSIS — I10 Essential (primary) hypertension: Secondary | ICD-10-CM

## 2020-02-12 DIAGNOSIS — I2510 Atherosclerotic heart disease of native coronary artery without angina pectoris: Secondary | ICD-10-CM | POA: Diagnosis not present

## 2020-02-12 MED ORDER — DILTIAZEM HCL ER COATED BEADS 180 MG PO CP24: 180.0000 mg | ORAL_CAPSULE | Freq: Every day | ORAL | 3 refills | Status: DC

## 2020-02-12 NOTE — Progress Notes (Signed)
Cardiology Office Note:    Date:  02/12/2020   ID:  Katherine Archer, DOB 06/01/1955, MRN [id number]  PCP:  [name], MD  Cardiologist:  Berniece Salines, DO  Electrophysiologist:  None   Referring MD: [name], MD   Blood pressure is still a little elevated  History of Present Illness:    Katherine Archer is a 65 y.o. female with a hx of a hx of coronary artery disease recently seen on coronary CTA, carotid disease, coronary disease recently seen on a coronary CTA, hypertension, hyperlipidemia, history of stroke comes today for follow-up visit.  When I last saw the patient she was preparing for peripheral artery disease bypass grafting surgery.  She did get the surgery on November 11, 2019.  She is here today for follow-up visit.  I saw the patient on January 14, 2018 at that time her blood pressure was elevated we had amlodipine 5 mg to her medication regimen.  But she tells me this medication is not helping she is developed significant palpitations.  Past Medical History:  Diagnosis Date  . Cervical polyp 09/18/2018  . Encounter for screening mammogram for breast cancer 05/05/2016  . Fibrocystic breast changes of both breasts 04/29/2015  . Hyperlipidemia   . Hypertension   . PAD (peripheral artery disease) ([place]) 08/23/2019  . Stroke [facility]) 08/23/2019    Past Surgical History:  Procedure Laterality Date  . ABDOMINAL AORTOGRAM W/LOWER EXTREMITY Bilateral 08/23/2019   Procedure: ABDOMINAL AORTOGRAM W/ Bilateral LOWER EXTREMITY [place];  Surgeon: [name], MD;  Location: [place] [facility];  Service: Cardiovascular;  Laterality: Bilateral;  . AXILLARY-FEMORAL BYPASS GRAFT Right 11/11/2019   Procedure: BYPASS GRAFT RIGHT AXILLA-BIFEMORAL;  Surgeon: [name], MD;  Location: [place];  Service: Vascular;  Laterality: Right;  . BRAIN SURGERY    . BREAST SURGERY Bilateral 2014   1980 R breast surgery  . CEREBRAL ANEURYSM REPAIR    . ENDARTERECTOMY FEMORAL Bilateral  11/11/2019   Procedure: ENDARTERECTOMY FEMORAL BILATERALLY;  Surgeon: [name], MD;  Location: [facility];  Service: Vascular;  Laterality: Bilateral;  . TUBAL LIGATION  1990    Current Medications: Current Meds  Medication Sig  . aspirin 81 MG EC tablet Take 81 mg by mouth daily.  Marland Kitchen atorvastatin (LIPITOR) 80 MG tablet TAKE 1 TABLET (80 MG TOTAL) BY MOUTH DAILY AT 6 PM  . diltiazem (CARDIZEM CD) 180 MG 24 hr capsule Take 1 capsule (180 mg total) by mouth daily.  . hydrALAZINE (APRESOLINE) 25 MG tablet Take 1 tablet (25 mg total) by mouth 3 (three) times daily.  . hydrochlorothiazide (HYDRODIURIL) 25 MG tablet Take 25 mg by mouth daily.  . ondansetron (ZOFRAN) 4 MG tablet Take 1 tablet (4 mg total) by mouth every 8 (eight) hours as needed for nausea or vomiting.  Marland Kitchen oxyCODONE-acetaminophen (PERCOCET/ROXICET) 5-325 MG tablet Take 1 tablet by mouth every 4 (four) hours as needed for severe pain.  . [DISCONTINUED] amLODipine (NORVASC) 5 MG tablet Take 1 tablet (5 mg total) by mouth daily.     Allergies:   Amoxicillin and Penicillins   Social History   Socioeconomic History  . Marital status: Divorced    Spouse name: Not on file  . Number of children: Not on file  . Years of education: Not on file  . Highest education level: Not on file  Occupational History  . Not on file  Tobacco Use  . Smoking status: Former Smoker    Packs/day: 0.25  Types: Cigarettes    Quit date: 10/04/2019    Years since quitting: 0.3  . Smokeless tobacco: Never Used  Vaping Use  . Vaping Use: Never used  Substance and Sexual Activity  . Alcohol use: No  . Drug use: No  . Sexual activity: Not Currently  Other Topics Concern  . Not on file  Social History Narrative  . Not on file   Social Determinants of Health   Financial Resource Strain: Not on file  Food Insecurity: Not on file  Transportation Needs: Not on file  Physical Activity: Not on file  Stress: Not on file  Social Connections: Not  on file     Family History: The patient's family history includes Heart Problems in her brother, brother, and father.  ROS:   Review of Systems  Constitution: Negative for decreased appetite, fever and weight gain.  HENT: Negative for congestion, ear discharge, hoarse voice and sore throat.   Eyes: Negative for discharge, redness, vision loss in right eye and visual halos.  Cardiovascular: Negative for chest pain, dyspnea on exertion, leg swelling, orthopnea and palpitations.  Respiratory: Negative for cough, hemoptysis, shortness of breath and snoring.   Endocrine: Negative for heat intolerance and polyphagia.  Hematologic/Lymphatic: Negative for bleeding problem. Does not bruise/bleed easily.  Skin: Negative for flushing, nail changes, rash and suspicious lesions.  Musculoskeletal: Negative for arthritis, joint pain, muscle cramps, myalgias, neck pain and stiffness.  Gastrointestinal: Negative for abdominal pain, bowel incontinence, diarrhea and excessive appetite.  Genitourinary: Negative for decreased libido, genital sores and incomplete emptying.  Neurological: Negative for brief paralysis, focal weakness, headaches and loss of balance.  Psychiatric/Behavioral: Negative for altered mental status, depression and suicidal ideas.  Allergic/Immunologic: Negative for HIV exposure and persistent infections.    EKGs/Labs/Other Studies Reviewed:    The following studies were reviewed today:   EKG: None today  CCTA   Aorta: Normal size. Mild aortic root calcifications. No dissection.  Aortic Valve: Trileaflet. No calcifications.  Coronary Arteries: Normal coronary origin. Right dominance.  RCA is a large dominant artery that gives rise to PDA and PLVB. There is minimal luminal irregularities in the proximal. Stair step artifact in the mid RCA. Mid and distal RCA not well visualized.  Left main is a large artery that gives rise to LAD and LCX arteries.  LAD is a  large vessel. Minimal (<24%) calcified plaque in the proximal LAD. Minimal (<24%) soft plaque in the mid LAD. The distal LAD with no plaques. The is mild myocardial bridging in the mid LAD. D1 and D2 with minimal luminal irregularities.  LCX is a non-dominant artery that gives rise to one large OM1 branch. There is minimal (<24%) soft plaque in the mid LCX.  Other findings:  Normal pulmonary vein drainage into the left atrium.  Normal left atrial appendage without a thrombus.  Normal size of the pulmonary artery.  IMPRESSION: 1. Coronary calcium score of 13. This was 36 percentile for age and sex matched control.  2. Normal coronary origin with right dominance.  3. Minimal CAD. CADRADs 1. Recommend medical therapy for primary prevention. This study will be send for FFR due the RCA not well visualized in the mid and distal portion for any flow limiting lesions.  Berniece Salines, DO   Recent Labs: 11/08/2019: ALT 19 01/15/2020: BUN 8; Creatinine, Ser 0.89; Hemoglobin 14.7; Magnesium 2.2; Platelets 187; Potassium 4.3; Sodium 138  Recent Lipid Panel    Component Value Date/Time   CHOL 118 11/12/2019 0046  TRIG 49 11/12/2019 0046   HDL 36 (L) 11/12/2019 0046   CHOLHDL 3.3 11/12/2019 0046   VLDL 10 11/12/2019 0046   LDLCALC 72 11/12/2019 0046    Physical Exam:    VS:  BP (!) 154/62   Pulse 90   Ht 5\' 6"  (1.676 m)   Wt 122 lb 9.6 oz (55.6 kg)   SpO2 98%   BMI 19.79 kg/m     Wt Readings from Last 3 Encounters:  02/12/20 122 lb 9.6 oz (55.6 kg)  01/15/20 120 lb 12.8 oz (54.8 kg)  12/05/19 122 lb (55.3 kg)     GEN: Well nourished, well developed in no acute distress HEENT: Normal NECK: No JVD; No carotid bruits LYMPHATICS: No lymphadenopathy CARDIAC: S1S2 noted,RRR, no murmurs, rubs, gallops RESPIRATORY:  Clear to auscultation without rales, wheezing or rhonchi  ABDOMEN: Soft, non-tender, non-distended, +bowel sounds, no guarding. EXTREMITIES: No edema,  No cyanosis, no clubbing MUSCULOSKELETAL:  No deformity  SKIN: Warm and dry NEUROLOGIC:  Alert and oriented x 3, non-focal PSYCHIATRIC:  Normal affect, good insight  ASSESSMENT:    1. PAD (peripheral artery disease) ([place])   2. Coronary artery disease involving native coronary artery of native heart without angina pectoris   3. Essential hypertension   4. Mixed hyperlipidemia    PLAN:     1.  Her blood pressure is elevated she tells me the palpitations are worse since being on amlodipine and we will stop the amlodipine and started patient on Cardizem 180 mg daily.  She has not seen ENT for nosebleeds which she tells me has stopped.  We will continue to monitor patient.  Hyperlipidemia - continue with current statin medication.  The patient is in agreement with the above plan. The patient left the office in stable condition.  The patient will follow up in 8 weeks or sooner if needed.   Medication Adjustments/Labs and Tests Ordered: Current medicines are reviewed at length with the patient today.  Concerns regarding medicines are outlined above.  No orders of the defined types were placed in this encounter.  Meds ordered this encounter  Medications  . diltiazem (CARDIZEM CD) 180 MG 24 hr capsule    Sig: Take 1 capsule (180 mg total) by mouth daily.    Dispense:  90 capsule    Refill:  3    Patient Instructions  Medication Instructions:  Your physician has recommended you make the following change in your medication:  STOP: Amlodipine START: Cardizem 180 mg daily.   *If you need a refill on your cardiac medications before your next appointment, please call your pharmacy*   Lab Work: None If you have labs (blood work) drawn today and your tests are completely normal, you will receive your results only by: Marland Kitchen MyChart Message (if you have MyChart) OR . A paper copy in the mail If you have any lab test that is abnormal or we need to change your treatment, we will call you to  review the results.   Testing/Procedures: None   Follow-Up: At [facility], you and your health needs are our priority.  As part of our continuing mission to provide you with exceptional heart care, we have created designated Provider Care Teams.  These Care Teams include your primary Cardiologist (physician) and Advanced Practice Providers (APPs -  Physician Assistants and Nurse Practitioners) who all work together to provide you with the care you need, when you need it.  We recommend signing up for the patient portal called "  MyChart".  Sign up information is provided on this After Visit Summary.  MyChart is used to connect with patients for Virtual Visits (Telemedicine).  Patients are able to view lab/test results, encounter notes, upcoming appointments, etc.  Non-urgent messages can be sent to your provider as well.   To learn more about what you can do with MyChart, go to [email or web address].    Your next appointment:   8 week(s)  The format for your next appointment:   In Person  Provider:   Berniece Salines, DO   Other Instructions      Adopting a Healthy Lifestyle.  Know what a healthy weight is for you (roughly BMI <25) and aim to maintain this   Aim for 7+ servings of fruits and vegetables daily   65-80+ fluid ounces of water or unsweet tea for healthy kidneys   Limit to max 1 drink of alcohol per day; avoid smoking/tobacco   Limit animal fats in diet for cholesterol and heart health - choose grass fed whenever available   Avoid highly processed foods, and foods high in saturated/trans fats   Aim for low stress - take time to unwind and care for your mental health   Aim for 150 min of moderate intensity exercise weekly for heart health, and weights twice weekly for bone health   Aim for 7-9 hours of sleep daily   When it comes to diets, agreement about the perfect plan isnt easy to find, even among the experts. Experts at the [employer] developed an idea known as the Healthy Eating Plate. Just imagine a plate divided into logical, healthy portions.   The emphasis is on diet quality:   Load up on vegetables and fruits - one-half of your plate: Aim for color and variety, and remember that potatoes dont count.   Go for whole grains - one-quarter of your plate: Whole wheat, barley, wheat berries, quinoa, oats, brown rice, and foods made with them. If you want pasta, go with whole wheat pasta.   Protein power - one-quarter of your plate: Fish, chicken, beans, and nuts are all healthy, versatile protein sources. Limit red meat.   The diet, however, does go beyond the plate, offering a few other suggestions.   Use healthy plant oils, such as olive, canola, soy, corn, sunflower and peanut. Check the labels, and avoid partially hydrogenated oil, which have unhealthy trans fats.   If youre thirsty, drink water. Coffee and tea are good in moderation, but skip sugary drinks and limit milk and dairy products to one or two daily servings.   The type of carbohydrate in the diet is more important than the amount. Some sources of carbohydrates, such as vegetables, fruits, whole grains, and beans-are healthier than others.   Finally, stay active  Signed, Berniece Salines, DO  02/12/2020 2:32 PM    [facility]

## 2020-02-12 NOTE — Patient Instructions (Signed)
Medication Instructions:  Your physician has recommended you make the following change in your medication:  STOP: Amlodipine START: Cardizem 180 mg daily.   *If you need a refill on your cardiac medications before your next appointment, please call your pharmacy*   Lab Work: None If you have labs (blood work) drawn today and your tests are completely normal, you will receive your results only by: Marland Kitchen MyChart Message (if you have MyChart) OR . A paper copy in the mail If you have any lab test that is abnormal or we need to change your treatment, we will call you to review the results.   Testing/Procedures: None   Follow-Up: At [facility], you and your health needs are our priority.  As part of our continuing mission to provide you with exceptional heart care, we have created designated Provider Care Teams.  These Care Teams include your primary Cardiologist (physician) and Advanced Practice Providers (APPs -  Physician Assistants and Nurse Practitioners) who all work together to provide you with the care you need, when you need it.  We recommend signing up for the patient portal called "MyChart".  Sign up information is provided on this After Visit Summary.  MyChart is used to connect with patients for Virtual Visits (Telemedicine).  Patients are able to view lab/test results, encounter notes, upcoming appointments, etc.  Non-urgent messages can be sent to your provider as well.   To learn more about what you can do with MyChart, go to [email or web address].    Your next appointment:   8 week(s)  The format for your next appointment:   In Person  Provider:   Berniece Salines, DO   Other Instructions

## 2020-02-13 ENCOUNTER — Ambulatory Visit (INDEPENDENT_AMBULATORY_CARE_PROVIDER_SITE_OTHER): Payer: Medicare HMO | Admitting: Adult Health

## 2020-02-13 ENCOUNTER — Encounter: Payer: Self-pay | Admitting: Adult Health

## 2020-02-13 VITALS — BP 147/77 | HR 68 | Ht 66.0 in | Wt 122.0 lb

## 2020-02-13 DIAGNOSIS — I639 Cerebral infarction, unspecified: Secondary | ICD-10-CM

## 2020-02-13 NOTE — Progress Notes (Signed)
I agree with the above plan 
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
## 2020-02-13 NOTE — Patient Instructions (Signed)
Continue all other treatments and plans - no changes today  Contact Dr. Estanislado Pandy to schedule follow up visit [place] [facility] [address] [place], [place] [address] [phone]   Follow up in 6 months or call earlier if needed

## 2020-03-02 DIAGNOSIS — I10 Essential (primary) hypertension: Secondary | ICD-10-CM | POA: Diagnosis not present

## 2020-03-04 ENCOUNTER — Other Ambulatory Visit (HOSPITAL_COMMUNITY): Payer: Self-pay | Admitting: Interventional Radiology

## 2020-03-04 DIAGNOSIS — I639 Cerebral infarction, unspecified: Secondary | ICD-10-CM

## 2020-03-04 DIAGNOSIS — I10 Essential (primary) hypertension: Secondary | ICD-10-CM | POA: Diagnosis not present

## 2020-03-05 ENCOUNTER — Other Ambulatory Visit: Payer: Self-pay

## 2020-03-05 ENCOUNTER — Ambulatory Visit (INDEPENDENT_AMBULATORY_CARE_PROVIDER_SITE_OTHER): Payer: Medicare HMO | Admitting: Physician Assistant

## 2020-03-05 ENCOUNTER — Ambulatory Visit: Payer: Medicare HMO

## 2020-03-05 ENCOUNTER — Ambulatory Visit (INDEPENDENT_AMBULATORY_CARE_PROVIDER_SITE_OTHER)
Admission: RE | Admit: 2020-03-05 | Discharge: 2020-03-05 | Disposition: A | Discharge status: Home / Self Care | Payer: Medicare HMO | Source: Ambulatory Visit | Attending: Vascular Surgery | Admitting: Vascular Surgery

## 2020-03-05 ENCOUNTER — Encounter (HOSPITAL_COMMUNITY): Payer: Medicare HMO

## 2020-03-05 ENCOUNTER — Ambulatory Visit (HOSPITAL_COMMUNITY)
Admission: RE | Admit: 2020-03-05 | Discharge: 2020-03-05 | Disposition: A | Discharge status: Home / Self Care | Payer: Medicare HMO | Source: Ambulatory Visit | Attending: Vascular Surgery | Admitting: Vascular Surgery

## 2020-03-05 VITALS — BP 140/68 | HR 81 | Temp 98.3°F | Resp 20 | Ht 66.0 in | Wt 122.0 lb

## 2020-03-05 DIAGNOSIS — I739 Peripheral vascular disease, unspecified: Secondary | ICD-10-CM

## 2020-03-05 DIAGNOSIS — M79673 Pain in unspecified foot: Secondary | ICD-10-CM | POA: Diagnosis not present

## 2020-03-05 NOTE — Progress Notes (Signed)
Established Previous Bypass   History of Present Illness   Katherine Archer is a 65 y.o. (09/05/1955) female who presents with chief complaint R leg and foot numbness.  The patient is Spanish-speaking however is here with her daughter who is acting as a Optometrist.  She is status post right axillary bifemoral bypass on 11/11/2019 by Dr. Oneida Alar due to rest pain in right lower extremity as well as severe claudication left lower extremity.  She is still having pain but mostly numbness in her right leg.  She has known bilateral SFA occlusions.  She considers her symptoms of her right leg to be lifestyle limiting and would be willing to pursue outflow revascularization.  She does not have any wounds on her foot.  She is on aspirin and a statin daily.   The patient's PMH, PSH, SH, and FamHx were reviewed and are unchanged from prior visit.  Current Outpatient Medications  Medication Sig Dispense Refill  . aspirin 81 MG EC tablet Take 81 mg by mouth daily.    Marland Kitchen atorvastatin (LIPITOR) 80 MG tablet TAKE 1 TABLET (80 MG TOTAL) BY MOUTH DAILY AT 6 PM 90 tablet 2  . diltiazem (CARDIZEM CD) 180 MG 24 hr capsule Take 1 capsule (180 mg total) by mouth daily. 90 capsule 3  . hydrALAZINE (APRESOLINE) 25 MG tablet Take 1 tablet (25 mg total) by mouth 3 (three) times daily. 90 tablet 1  . hydrochlorothiazide (HYDRODIURIL) 25 MG tablet Take 25 mg by mouth daily.    . ondansetron (ZOFRAN) 4 MG tablet Take 1 tablet (4 mg total) by mouth every 8 (eight) hours as needed for nausea or vomiting. 20 tablet 0  . oxyCODONE-acetaminophen (PERCOCET/ROXICET) 5-325 MG tablet Take 1 tablet by mouth every 4 (four) hours as needed for severe pain. 15 tablet 0  . TIADYLT ER 180 MG 24 hr capsule Take 180 mg by mouth at bedtime.     No current facility-administered medications for this visit.    REVIEW OF SYSTEMS (negative unless checked):   Cardiac:  []  Chest pain or chest pressure? []  Shortness of breath upon activity? []   Shortness of breath when lying flat? []  Irregular heart rhythm?  Vascular:  []  Pain in calf, thigh, or hip brought on by walking? []  Pain in feet at night that wakes you up from your sleep? []  Blood clot in your veins? []  Leg swelling?  Pulmonary:  []  Oxygen at home? []  Productive cough? []  Wheezing?  Neurologic:  []  Sudden weakness in arms or legs? []  Sudden numbness in arms or legs? []  Sudden onset of difficult speaking or slurred speech? []  Temporary loss of vision in one eye? []  Problems with dizziness?  Gastrointestinal:  []  Blood in stool? []  Vomited blood?  Genitourinary:  []  Burning when urinating? []  Blood in urine?  Psychiatric:  []  Major depression  Hematologic:  []  Bleeding problems? []  Problems with blood clotting?  Dermatologic:  []  Rashes or ulcers?  Constitutional:  []  Fever or chills?  Ear/Nose/Throat:  []  Change in hearing? []  Nose bleeds? []  Sore throat?  Musculoskeletal:  []  Back pain? []  Joint pain? []  Muscle pain?   Physical Examination   Vitals:   03/05/20 1537  BP: 140/68  Pulse: 81  Resp: 20  Temp: 98.3 F (36.8 C)  TempSrc: Temporal  SpO2: 100%  Weight: 122 lb (55.3 kg)  Height: 5\' 6"  (1.676 m)   Body mass index is 19.69 kg/m.  General:  WDWN in NAD; vital signs documented  above Gait: Not observed HENT: WNL, normocephalic Pulmonary: normal non-labored breathing Cardiac: regular HR Abdomen: soft, NT, no masses Skin: without rashes Vascular Exam/Pulses:   Right Left  Radial 2+ (normal) 2+ (normal)  DP absent absent  PT absent absent   Extremities: without ischemic changes, without Gangrene , without cellulitis; without open wounds;  Musculoskeletal: no muscle wasting or atrophy  Neurologic: A&O X 3;  No focal weakness or paresthesias are detected Psychiatric:  The pt has Normal affect.  Non-Invasive Vascular Imaging ABI  ABI/TBIToday's ABIToday's TBIPrevious ABIPrevious TBI   +-------+-----------+-----------+------------+------------+  Right 0.39    0.15    0.34    0.12      +-------+-----------+-----------+------------+------------+  Left  0.61    0.31    0.62    0.28      Axfem  Duplex suggests inflow stenosis of the ax bifemoral bypass however flow was turbulent  Femorofemoral graft patent.  But monophasic   Medical Decision Making   Katherine Archer is a 65 y.o. female who presents for surveillance of ax bifemoral bypass   Duplex demonstrates a patent axillofemoral and femorofemoral bypass graft; there are high velocities in the inflow and proximal axillofemoral graft however graft pulses easily palpable  Patient continues to have lifestyle limiting numbness and occasional pain of right lower extremity; she has a known SFA occlusion  Case was discussed with Dr. Oneida Alar; plan will be for CTA with runoff to follow-up with Dr. Oneida Alar in 1 to 2 weeks to discuss possible right lower extremity bypass surgery  Patient and daughter understand and are willing to proceed   Dagoberto Ligas PA-C [facility] Office: [phone]  Clinic MD: Oneida Alar

## 2020-03-06 ENCOUNTER — Other Ambulatory Visit: Payer: Self-pay

## 2020-03-06 DIAGNOSIS — I739 Peripheral vascular disease, unspecified: Secondary | ICD-10-CM

## 2020-03-10 ENCOUNTER — Ambulatory Visit
Admission: RE | Admit: 2020-03-10 | Discharge: 2020-03-10 | Disposition: A | Discharge status: Home / Self Care | Payer: Medicare HMO | Source: Ambulatory Visit | Attending: Vascular Surgery | Admitting: Vascular Surgery

## 2020-03-10 DIAGNOSIS — I745 Embolism and thrombosis of iliac artery: Secondary | ICD-10-CM | POA: Diagnosis not present

## 2020-03-10 DIAGNOSIS — I739 Peripheral vascular disease, unspecified: Secondary | ICD-10-CM

## 2020-03-10 DIAGNOSIS — I70203 Unspecified atherosclerosis of native arteries of extremities, bilateral legs: Secondary | ICD-10-CM | POA: Diagnosis not present

## 2020-03-10 DIAGNOSIS — K5730 Diverticulosis of large intestine without perforation or abscess without bleeding: Secondary | ICD-10-CM | POA: Diagnosis not present

## 2020-03-10 DIAGNOSIS — R200 Anesthesia of skin: Secondary | ICD-10-CM | POA: Diagnosis not present

## 2020-03-10 MED ORDER — IOPAMIDOL (ISOVUE-370) INJECTION 76%
125.0000 mL | Freq: Once | INTRAVENOUS | Status: AC | PRN
  Administered 2020-03-10: 125 mL via INTRAVENOUS

## 2020-03-11 ENCOUNTER — Encounter (HOSPITAL_COMMUNITY): Payer: Self-pay

## 2020-03-11 ENCOUNTER — Other Ambulatory Visit: Payer: Self-pay

## 2020-03-11 ENCOUNTER — Ambulatory Visit (HOSPITAL_COMMUNITY)
Admission: RE | Admit: 2020-03-11 | Discharge: 2020-03-11 | Disposition: A | Discharge status: Home / Self Care | Payer: Medicare HMO | Source: Ambulatory Visit | Attending: Interventional Radiology | Admitting: Interventional Radiology

## 2020-03-11 DIAGNOSIS — I639 Cerebral infarction, unspecified: Secondary | ICD-10-CM

## 2020-03-19 ENCOUNTER — Other Ambulatory Visit: Payer: Self-pay

## 2020-03-19 ENCOUNTER — Ambulatory Visit (INDEPENDENT_AMBULATORY_CARE_PROVIDER_SITE_OTHER): Payer: Medicare HMO | Admitting: Vascular Surgery

## 2020-03-19 ENCOUNTER — Encounter: Payer: Self-pay | Admitting: Vascular Surgery

## 2020-03-19 VITALS — BP 109/72 | HR 93 | Temp 98.4°F | Resp 20 | Ht 66.0 in | Wt 122.0 lb

## 2020-03-19 DIAGNOSIS — I739 Peripheral vascular disease, unspecified: Secondary | ICD-10-CM | POA: Diagnosis not present

## 2020-03-19 NOTE — Progress Notes (Signed)
Patient is a 65 year old female who returns for follow-up today.  She was last seen December 2021.  She has previously undergone right axillary bifemoral bypass with bilateral common femoral endarterectomy in November 2021.  This was done for rest pain in both feet.  She continues to complain of right foot pain and numbness.  Her daughter was with her at the office visit today to provide interpretation.  The patient complains primarily of pain over the right anterior muscle compartment but this does not really seem to be related to walking distance.  She is able to walk about 4 blocks and experiences no pain in the posterior aspect of her right calf.  She does have pain in her right foot but this is intermittent in nature.  It has been hurting for the last 3 days.  However, prior to that she had no pain in the right foot.  He does not have any nonhealing wounds.  Her most recent ABIs in January 2022 were 0.4 on the right 0.6 on the left.  This is improved from her preoperative ABIs which were 0.12 on the right 0.28 on the left.  She is on a statin and aspirin.  She states she is not smoking.  Past Medical History:  Diagnosis Date  . Cervical polyp 09/18/2018  . Critical lower limb ischemia ([place])   . Encounter for screening mammogram for breast cancer 05/05/2016  . Fibrocystic breast changes of both breasts 04/29/2015  . Hyperlipidemia   . Hypertension   . PAD (peripheral artery disease) ([place]) 08/23/2019  . Stroke [facility]) 08/23/2019    Past Surgical History:  Procedure Laterality Date  . ABDOMINAL AORTOGRAM W/LOWER EXTREMITY Bilateral 08/23/2019   Procedure: ABDOMINAL AORTOGRAM W/ Bilateral LOWER EXTREMITY Runoff;  Surgeon: [name], MD;  Location: [place] CV LAB;  Service: Cardiovascular;  Laterality: Bilateral;  . AXILLARY-FEMORAL BYPASS GRAFT Right 11/11/2019   Procedure: BYPASS GRAFT RIGHT AXILLA-BIFEMORAL;  Surgeon: [name], MD;  Location: [place];  Service: Vascular;  Laterality:  Right;  . BRAIN SURGERY    . BREAST SURGERY Bilateral 2014   1980 R breast surgery  . CEREBRAL ANEURYSM REPAIR    . ENDARTERECTOMY FEMORAL Bilateral 11/11/2019   Procedure: ENDARTERECTOMY FEMORAL BILATERALLY;  Surgeon: [name], MD;  Location: [facility] OR;  Service: Vascular;  Laterality: Bilateral;  . TUBAL LIGATION  1990    Review of systems: She has shortness of breath with exertion.  She does not have chest pain.  Current Outpatient Medications on File Prior to Visit  Medication Sig Dispense Refill  . aspirin 81 MG EC tablet Take 81 mg by mouth daily.    Marland Kitchen atorvastatin (LIPITOR) 80 MG tablet TAKE 1 TABLET (80 MG TOTAL) BY MOUTH DAILY AT 6 PM 90 tablet 2  . diltiazem (CARDIZEM CD) 180 MG 24 hr capsule Take 1 capsule (180 mg total) by mouth daily. 90 capsule 3  . hydrALAZINE (APRESOLINE) 25 MG tablet Take 1 tablet (25 mg total) by mouth 3 (three) times daily. 90 tablet 1  . hydrochlorothiazide (HYDRODIURIL) 25 MG tablet Take 25 mg by mouth daily.    . ondansetron (ZOFRAN) 4 MG tablet Take 1 tablet (4 mg total) by mouth every 8 (eight) hours as needed for nausea or vomiting. 20 tablet 0  . oxyCODONE-acetaminophen (PERCOCET/ROXICET) 5-325 MG tablet Take 1 tablet by mouth every 4 (four) hours as needed for severe pain. 15 tablet 0  . TIADYLT ER 180 MG 24 hr capsule Take 180  mg by mouth at bedtime.     No current facility-administered medications on file prior to visit.    Physical exam:  Vitals:   03/19/20 1109  BP: 109/72  Pulse: 93  Resp: 20  Temp: 98.4 F (36.9 C)  SpO2: 98%  Weight: 122 lb (55.3 kg)  Height: 5\' 6"  (1.676 m)    Extremities: 2+ femoral pulses absent pedal pulses bilaterally  Skin: No open ulcer or wound feet are symmetrically warm  Data: I reviewed the patient's CT angiogram of the abdomen and pelvis which shows a patent axillary bifemoral bypass.  She has bilateral flush superficial femoral artery occlusions with reconstitution of the above-knee  popliteal artery and three-vessel runoff bilaterally although the left peroneal artery is diminutive.  Assessment: Intermittent right anterior compartment and right foot pain.  Her ABIs are in the 0.4 range.  However, anatomically speaking she has fairly similar anatomy bilaterally and has an ABI of 0.6 on the contralateral side.  Her symptoms are not the ones classically seen with claudication or rest pain.  Her pain is intermittent in nature and more in the anterior compartment rather than in the posterior compartment.  I discussed the symptoms with the patient and her daughter today.  We discussed whether or not to proceed with a bypass at this point to complete revascularization on the right side but discussed the risks of wound complications or possible graft infection.  In light of the fact I am not completely convinced all of her symptoms are related to peripheral arterial disease in the right leg.  We will defer the bypass for now and I will see her back in follow-up in 4 to 6 weeks with a repeat graft scan as well as bilateral ABIs.  If her symptoms continue to worsen or become more classic in nature or she develops a wound on her foot we would proceed with a right femoral to above-knee popliteal bypass.  Plan: Follow-up ABIs and graft duplex in 4 to 6 weeks.  Otherwise see above.  Ruta Hinds, MD [facility] Office: [phone]

## 2020-03-20 ENCOUNTER — Other Ambulatory Visit: Payer: Self-pay

## 2020-03-20 DIAGNOSIS — I739 Peripheral vascular disease, unspecified: Secondary | ICD-10-CM

## 2020-03-31 ENCOUNTER — Other Ambulatory Visit: Payer: Self-pay

## 2020-03-31 ENCOUNTER — Ambulatory Visit (HOSPITAL_COMMUNITY)
Admission: RE | Admit: 2020-03-31 | Discharge: 2020-03-31 | Disposition: A | Discharge status: Home / Self Care | Payer: Medicare HMO | Source: Ambulatory Visit | Attending: Interventional Radiology | Admitting: Interventional Radiology

## 2020-03-31 DIAGNOSIS — I671 Cerebral aneurysm, nonruptured: Secondary | ICD-10-CM | POA: Diagnosis not present

## 2020-04-02 DIAGNOSIS — I10 Essential (primary) hypertension: Secondary | ICD-10-CM | POA: Diagnosis not present

## 2020-04-02 HISTORY — PX: IR RADIOLOGIST EVAL & MGMT: IMG5224

## 2020-04-03 DIAGNOSIS — I10 Essential (primary) hypertension: Secondary | ICD-10-CM | POA: Diagnosis not present

## 2020-04-06 DIAGNOSIS — I70229 Atherosclerosis of native arteries of extremities with rest pain, unspecified extremity: Secondary | ICD-10-CM | POA: Insufficient documentation

## 2020-04-08 ENCOUNTER — Ambulatory Visit (INDEPENDENT_AMBULATORY_CARE_PROVIDER_SITE_OTHER): Payer: Medicare HMO | Admitting: Cardiology

## 2020-04-08 ENCOUNTER — Other Ambulatory Visit: Payer: Self-pay

## 2020-04-08 ENCOUNTER — Encounter: Payer: Self-pay | Admitting: Cardiology

## 2020-04-08 VITALS — BP 142/60 | HR 82 | Ht 66.0 in | Wt 127.8 lb

## 2020-04-08 DIAGNOSIS — E782 Mixed hyperlipidemia: Secondary | ICD-10-CM

## 2020-04-08 DIAGNOSIS — I10 Essential (primary) hypertension: Secondary | ICD-10-CM | POA: Diagnosis not present

## 2020-04-08 DIAGNOSIS — I739 Peripheral vascular disease, unspecified: Secondary | ICD-10-CM

## 2020-04-08 DIAGNOSIS — I2510 Atherosclerotic heart disease of native coronary artery without angina pectoris: Secondary | ICD-10-CM

## 2020-04-08 MED ORDER — HYDRALAZINE HCL 50 MG PO TABS: 50.0000 mg | ORAL_TABLET | Freq: Three times a day (TID) | ORAL | 3 refills | Status: DC

## 2020-04-08 NOTE — Progress Notes (Signed)
Cardiology Office Note:    Date:  04/08/2020   ID:  Katherine Archer, DOB [birth year], MRN [id number]  PCP:  [name], MD  Cardiologist:  Berniece Salines, DO  Electrophysiologist:  None   Referring MD: [name], MD   I am feeling better the palpitations has improved  History of Present Illness:    Katherine Archer is a 65 y.o. female with a hx of coronary artery disease recently seen on coronary CTA,carotid disease, coronary disease recently seen on a coronary CTA, hypertension, hyperlipidemia, history of stroke comes today for follow-up visit.  When I last saw the patient she was preparing for peripheral artery disease bypass grafting surgery. She did get the surgery on November 11, 2019.She is here today for follow-up visit.  I saw the patient on January 14, 2018 at that time her blood pressure was elevated we had amlodipine 5 mg to her medication regimen.  I did see the patient on February 2022 at that time she told me that since starting amlodipine she had significant palpitations.  During that visit what we did we will stop the amlodipine and started the patient on Cardizem 120 mg daily.  She is here today for follow-up visit.  She tells me that the palpitation has resolved.  No other complaints at this time.  Our visit was facilitated by in person [facility] health interpreter.   Past Medical History:  Diagnosis Date  . Cervical polyp 09/18/2018  . Critical lower limb ischemia ([place])   . Encounter for screening mammogram for breast cancer 05/05/2016  . Fibrocystic breast changes of both breasts 04/29/2015  . Hyperlipidemia   . Hypertension   . PAD (peripheral artery disease) ([place]) 08/23/2019  . Stroke [facility]) 08/23/2019    Past Surgical History:  Procedure Laterality Date  . ABDOMINAL AORTOGRAM W/LOWER EXTREMITY Bilateral 08/23/2019   Procedure: ABDOMINAL AORTOGRAM W/ Bilateral LOWER EXTREMITY [place];  Surgeon: [name], MD;  Location: [place] CV LAB;  Service:  Cardiovascular;  Laterality: Bilateral;  . AXILLARY-FEMORAL BYPASS GRAFT Right 11/11/2019   Procedure: BYPASS GRAFT RIGHT AXILLA-BIFEMORAL;  Surgeon: [name], MD;  Location: [place];  Service: Vascular;  Laterality: Right;  . BRAIN SURGERY    . BREAST SURGERY Bilateral 2014   1980 R breast surgery  . CEREBRAL ANEURYSM REPAIR    . ENDARTERECTOMY FEMORAL Bilateral 11/11/2019   Procedure: ENDARTERECTOMY FEMORAL BILATERALLY;  Surgeon: [name], MD;  Location: [facility] OR;  Service: Vascular;  Laterality: Bilateral;  . IR RADIOLOGIST EVAL & MGMT  04/02/2020  . TUBAL LIGATION  1990    Current Medications: Current Meds  Medication Sig  . aspirin 81 MG EC tablet Take 81 mg by mouth daily.  Marland Kitchen atorvastatin (LIPITOR) 80 MG tablet TAKE 1 TABLET (80 MG TOTAL) BY MOUTH DAILY AT 6 PM  . diltiazem (CARDIZEM CD) 180 MG 24 hr capsule Take 1 capsule (180 mg total) by mouth daily.  . hydrALAZINE (APRESOLINE) 50 MG tablet Take 1 tablet (50 mg total) by mouth 3 (three) times daily.  . hydrochlorothiazide (HYDRODIURIL) 25 MG tablet Take 25 mg by mouth daily.  . ondansetron (ZOFRAN) 4 MG tablet Take 1 tablet (4 mg total) by mouth every 8 (eight) hours as needed for nausea or vomiting.  Marland Kitchen oxyCODONE-acetaminophen (PERCOCET/ROXICET) 5-325 MG tablet Take 1 tablet by mouth every 4 (four) hours as needed for severe pain.  Deborah Chalk ER 180 MG 24 hr capsule Take 180 mg by mouth at bedtime.  . [DISCONTINUED] hydrALAZINE (  APRESOLINE) 25 MG tablet Take 1 tablet (25 mg total) by mouth 3 (three) times daily.     Allergies:   Amoxicillin and Penicillins   Social History   Socioeconomic History  . Marital status: Divorced    Spouse name: Not on file  . Number of children: Not on file  . Years of education: Not on file  . Highest education level: Not on file  Occupational History  . Not on file  Tobacco Use  . Smoking status: Former Smoker    Packs/day: 0.25    Types: Cigarettes    Quit date: 10/04/2019     Years since quitting: 0.5  . Smokeless tobacco: Never Used  Vaping Use  . Vaping Use: Never used  Substance and Sexual Activity  . Alcohol use: No  . Drug use: No  . Sexual activity: Not Currently  Other Topics Concern  . Not on file  Social History Narrative  . Not on file   Social Determinants of Health   Financial Resource Strain: Not on file  Food Insecurity: Not on file  Transportation Needs: Not on file  Physical Activity: Not on file  Stress: Not on file  Social Connections: Not on file     Family History: The patient's family history includes Heart Problems in her brother, brother, and father.  ROS:   Review of Systems  Constitution: Negative for decreased appetite, fever and weight gain.  HENT: Negative for congestion, ear discharge, hoarse voice and sore throat.   Eyes: Negative for discharge, redness, vision loss in right eye and visual halos.  Cardiovascular: Negative for chest pain, dyspnea on exertion, leg swelling, orthopnea and palpitations.  Respiratory: Negative for cough, hemoptysis, shortness of breath and snoring.   Endocrine: Negative for heat intolerance and polyphagia.  Hematologic/Lymphatic: Negative for bleeding problem. Does not bruise/bleed easily.  Skin: Negative for flushing, nail changes, rash and suspicious lesions.  Musculoskeletal: Negative for arthritis, joint pain, muscle cramps, myalgias, neck pain and stiffness.  Gastrointestinal: Negative for abdominal pain, bowel incontinence, diarrhea and excessive appetite.  Genitourinary: Negative for decreased libido, genital sores and incomplete emptying.  Neurological: Negative for brief paralysis, focal weakness, headaches and loss of balance.  Psychiatric/Behavioral: Negative for altered mental status, depression and suicidal ideas.  Allergic/Immunologic: Negative for HIV exposure and persistent infections.    EKGs/Labs/Other Studies Reviewed:    The following studies were reviewed  today:   EKG: None today  CCTA 09/26/2019 Aorta: Normal size. Mild aortic root calcifications. No dissection.  Aortic Valve: Trileaflet. No calcifications.  Coronary Arteries: Normal coronary origin. Right dominance.  RCA is a large dominant artery that gives rise to PDA and PLVB. There is minimal luminal irregularities in the proximal. Stair step artifact in the mid RCA. Mid and distal RCA not well visualized.  Left main is a large artery that gives rise to LAD and LCX arteries.  LAD is a large vessel. Minimal (<24%) calcified plaque in the proximal LAD. Minimal (<24%) soft plaque in the mid LAD. The distal LAD with no plaques. The is mild myocardial bridging in the mid LAD. D1 and D2 with minimal luminal irregularities.  LCX is a non-dominant artery that gives rise to one large OM1 branch. There is minimal (<24%) soft plaque in the mid LCX.  Other findings:  Normal pulmonary vein drainage into the left atrium.  Normal left atrial appendage without a thrombus.  Normal size of the pulmonary artery.  IMPRESSION: 1. Coronary calcium score of 13. This was  30 percentile for age and sex matched control.  2. Normal coronary origin with right dominance.  3. Minimal CAD. CADRADs 1. Recommend medical therapy for primary prevention. This study will be send for FFR due the RCA not well visualized in the mid and distal portion for any flow limiting lesions.  Berniece Salines, DO   Recent Labs: 11/08/2019: ALT 19 01/15/2020: BUN 8; Creatinine, Ser 0.89; Hemoglobin 14.7; Magnesium 2.2; Platelets 187; Potassium 4.3; Sodium 138  Recent Lipid Panel    Component Value Date/Time   CHOL 118 11/12/2019 0046   TRIG 49 11/12/2019 0046   HDL 36 (L) 11/12/2019 0046   CHOLHDL 3.3 11/12/2019 0046   VLDL 10 11/12/2019 0046   LDLCALC 72 11/12/2019 0046    Physical Exam:    VS:  BP (!) 142/60   Pulse 82   Ht 5\' 6"  (1.676 m)   Wt 127 lb 12.8 oz (58 kg)   SpO2 96%    BMI 20.63 kg/m     Wt Readings from Last 3 Encounters:  04/08/20 127 lb 12.8 oz (58 kg)  03/19/20 122 lb (55.3 kg)  03/05/20 122 lb (55.3 kg)     GEN: Well nourished, well developed in no acute distress HEENT: Normal NECK: No JVD; No carotid bruits LYMPHATICS: No lymphadenopathy CARDIAC: S1S2 noted,RRR, no murmurs, rubs, gallops RESPIRATORY:  Clear to auscultation without rales, wheezing or rhonchi  ABDOMEN: Soft, non-tender, non-distended, +bowel sounds, no guarding. EXTREMITIES: No edema, No cyanosis, no clubbing MUSCULOSKELETAL:  No deformity  SKIN: Warm and dry NEUROLOGIC:  Alert and oriented x 3, non-focal PSYCHIATRIC:  Normal affect, good insight  ASSESSMENT:    1. Essential hypertension   2. PAD (peripheral artery disease) ([place])   3. Coronary artery disease involving native coronary artery of native heart without angina pectoris   4. Mixed hyperlipidemia    PLAN:     She is hypertensive today in the office.  What I like to do is increase hydralazine to 50 mg 3 times daily.  She is agreeable. No anginal symptoms continue her aspirin atorvastatin.   The patient is in agreement with the above plan. The patient left the office in stable condition.  The patient will follow up in 6 months or sooner if needed.   Medication Adjustments/Labs and Tests Ordered: Current medicines are reviewed at length with the patient today.  Concerns regarding medicines are outlined above.  No orders of the defined types were placed in this encounter.  Meds ordered this encounter  Medications  . hydrALAZINE (APRESOLINE) 50 MG tablet    Sig: Take 1 tablet (50 mg total) by mouth 3 (three) times daily.    Dispense:  270 tablet    Refill:  3    Patient Instructions  Medication Instructions:  Your physician has recommended you make the following change in your medication:  INCREASE: Hydralazine 50 mg three times a day *If you need a refill on your cardiac medications before your next  appointment, please call your pharmacy*   Lab Work: None If you have labs (blood work) drawn today and your tests are completely normal, you will receive your results only by: Marland Kitchen MyChart Message (if you have MyChart) OR . A paper copy in the mail If you have any lab test that is abnormal or we need to change your treatment, we will call you to review the results.   Testing/Procedures: None   Follow-Up: At [facility], you and your health needs are our priority.  As part of our continuing mission to provide you with exceptional heart care, we have created designated Provider Care Teams.  These Care Teams include your primary Cardiologist (physician) and Advanced Practice Providers (APPs -  Physician Assistants and Nurse Practitioners) who all work together to provide you with the care you need, when you need it.  We recommend signing up for the patient portal called "MyChart".  Sign up information is provided on this After Visit Summary.  MyChart is used to connect with patients for Virtual Visits (Telemedicine).  Patients are able to view lab/test results, encounter notes, upcoming appointments, etc.  Non-urgent messages can be sent to your provider as well.   To learn more about what you can do with MyChart, go to [email or web address].    Your next appointment:   6 month(s)  The format for your next appointment:   In Person  Provider:   Berniece Salines, DO   Other Instructions      Adopting a Healthy Lifestyle.  Know what a healthy weight is for you (roughly BMI <25) and aim to maintain this   Aim for 7+ servings of fruits and vegetables daily   65-80+ fluid ounces of water or unsweet tea for healthy kidneys   Limit to max 1 drink of alcohol per day; avoid smoking/tobacco   Limit animal fats in diet for cholesterol and heart health - choose grass fed whenever available   Avoid highly processed foods, and foods high in saturated/trans fats   Aim for low stress -  take time to unwind and care for your mental health   Aim for 150 min of moderate intensity exercise weekly for heart health, and weights twice weekly for bone health   Aim for 7-9 hours of sleep daily   When it comes to diets, agreement about the perfect plan isnt easy to find, even among the experts. Experts at the [employer] developed an idea known as the Healthy Eating Plate. Just imagine a plate divided into logical, healthy portions.   The emphasis is on diet quality:   Load up on vegetables and fruits - one-half of your plate: Aim for color and variety, and remember that potatoes dont count.   Go for whole grains - one-quarter of your plate: Whole wheat, barley, wheat berries, quinoa, oats, brown rice, and foods made with them. If you want pasta, go with whole wheat pasta.   Protein power - one-quarter of your plate: Fish, chicken, beans, and nuts are all healthy, versatile protein sources. Limit red meat.   The diet, however, does go beyond the plate, offering a few other suggestions.   Use healthy plant oils, such as olive, canola, soy, corn, sunflower and peanut. Check the labels, and avoid partially hydrogenated oil, which have unhealthy trans fats.   If youre thirsty, drink water. Coffee and tea are good in moderation, but skip sugary drinks and limit milk and dairy products to one or two daily servings.   The type of carbohydrate in the diet is more important than the amount. Some sources of carbohydrates, such as vegetables, fruits, whole grains, and beans-are healthier than others.   Finally, stay active  Signed, Berniece Salines, DO  04/08/2020 5:05 PM    [facility]

## 2020-04-08 NOTE — Patient Instructions (Signed)
Medication Instructions:  Your physician has recommended you make the following change in your medication:  INCREASE: Hydralazine 50 mg three times a day *If you need a refill on your cardiac medications before your next appointment, please call your pharmacy*   Lab Work: None If you have labs (blood work) drawn today and your tests are completely normal, you will receive your results only by: Marland Kitchen MyChart Message (if you have MyChart) OR . A paper copy in the mail If you have any lab test that is abnormal or we need to change your treatment, we will call you to review the results.   Testing/Procedures: None   Follow-Up: At [facility], you and your health needs are our priority.  As part of our continuing mission to provide you with exceptional heart care, we have created designated Provider Care Teams.  These Care Teams include your primary Cardiologist (physician) and Advanced Practice Providers (APPs -  Physician Assistants and Nurse Practitioners) who all work together to provide you with the care you need, when you need it.  We recommend signing up for the patient portal called "MyChart".  Sign up information is provided on this After Visit Summary.  MyChart is used to connect with patients for Virtual Visits (Telemedicine).  Patients are able to view lab/test results, encounter notes, upcoming appointments, etc.  Non-urgent messages can be sent to your provider as well.   To learn more about what you can do with MyChart, go to [email or web address].    Your next appointment:   6 month(s)  The format for your next appointment:   In Person  Provider:   Berniece Salines, DO   Other Instructions

## 2020-04-16 ENCOUNTER — Encounter: Payer: Self-pay | Admitting: Vascular Surgery

## 2020-04-16 ENCOUNTER — Ambulatory Visit (INDEPENDENT_AMBULATORY_CARE_PROVIDER_SITE_OTHER)
Admission: RE | Admit: 2020-04-16 | Discharge: 2020-04-16 | Disposition: A | Discharge status: Home / Self Care | Payer: Medicare HMO | Source: Ambulatory Visit | Attending: Vascular Surgery | Admitting: Vascular Surgery

## 2020-04-16 ENCOUNTER — Ambulatory Visit (INDEPENDENT_AMBULATORY_CARE_PROVIDER_SITE_OTHER): Payer: Medicare HMO | Admitting: Vascular Surgery

## 2020-04-16 ENCOUNTER — Ambulatory Visit (HOSPITAL_COMMUNITY)
Admission: RE | Admit: 2020-04-16 | Discharge: 2020-04-16 | Disposition: A | Discharge status: Home / Self Care | Payer: Medicare HMO | Source: Ambulatory Visit | Attending: Vascular Surgery | Admitting: Vascular Surgery

## 2020-04-16 ENCOUNTER — Other Ambulatory Visit: Payer: Self-pay

## 2020-04-16 VITALS — BP 154/68 | HR 77 | Temp 98.3°F | Resp 20 | Ht 66.0 in | Wt 126.0 lb

## 2020-04-16 DIAGNOSIS — I739 Peripheral vascular disease, unspecified: Secondary | ICD-10-CM | POA: Diagnosis not present

## 2020-04-16 NOTE — Progress Notes (Signed)
Patient is a 65 year old female who returns for follow-up today.  She previously underwent right axillary bifemoral bypass with bilateral common femoral endarterectomy on November 11, 2019.  She was last seen in March 2022.  He had some complaints of pain and numbness in the right foot at that office visit but this seems to have resolved.  She does not speak very good [place] and the interview was conducted with the assistance of her daughter today.  She apparently was recently at the [facility] and was able to walk all day with several rest stops but able to complete this without difficulty.  She does have an area on her left foot that is slightly tender to palpation currently.  She does not really complain of rest pain in either foot and has no nonhealing wounds.  She is not smoking.  She is planning a trip to the [place] in June and July.  She is on a statin and aspirin.  Review of systems: She has no shortness of breath.  She has no chest pain.  Current Outpatient Medications on File Prior to Visit  Medication Sig Dispense Refill  . aspirin 81 MG EC tablet Take 81 mg by mouth daily.    Marland Kitchen atorvastatin (LIPITOR) 80 MG tablet TAKE 1 TABLET (80 MG TOTAL) BY MOUTH DAILY AT 6 PM 90 tablet 2  . diltiazem (CARDIZEM CD) 180 MG 24 hr capsule Take 1 capsule (180 mg total) by mouth daily. 90 capsule 3  . hydrALAZINE (APRESOLINE) 50 MG tablet Take 1 tablet (50 mg total) by mouth 3 (three) times daily. 270 tablet 3  . hydrochlorothiazide (HYDRODIURIL) 25 MG tablet Take 25 mg by mouth daily.    . ondansetron (ZOFRAN) 4 MG tablet Take 1 tablet (4 mg total) by mouth every 8 (eight) hours as needed for nausea or vomiting. 20 tablet 0  . TIADYLT ER 180 MG 24 hr capsule Take 180 mg by mouth at bedtime.     No current facility-administered medications on file prior to visit.   Physical exam:  Vitals:   04/16/20 1356  BP: (!) 154/68  Pulse: 77  Resp: 20  Temp: 98.3 F (36.8 C)  SpO2: 96%  Weight:  126 lb (57.2 kg)  Height: 5\' 6"  (1.676 m)    Extremities: 2+ femoral pulses absent popliteal and pedal pulses bilaterally  Skin: She has a plantar wart in the midportion plantar aspect of her left foot which is slightly tender palpation.  There is no erythema.  Data: She had bilateral ABIs performed today which were 0.4 on the right 0.6 on the left.  She also had a duplex ultrasound performed of her axbifem which showed this was patent.  ABIs are essentially unchanged from March of this year.  Preop ABIs were 0.2 and 0.4 respectively.  This was in August 2021.  Reviewed all of the studies today.  Assessment: Patent axillary bifemoral bypass graft with minimal claudication symptoms no rest pain  Plan: Patient and her daughter were counseled on how to protect her feet and to check these daily.  She will place a circular disc pad over the plantar wart on the left foot to try to protect this area until it heals.  She will follow up with a duplex ultrasound of her right lower extremity or axbifem and bilateral ABIs in 3 months.  Ruta Hinds, MD [facility] Office: [phone]

## 2020-04-21 ENCOUNTER — Other Ambulatory Visit: Payer: Self-pay

## 2020-04-21 DIAGNOSIS — I739 Peripheral vascular disease, unspecified: Secondary | ICD-10-CM

## 2020-05-02 DIAGNOSIS — I10 Essential (primary) hypertension: Secondary | ICD-10-CM | POA: Diagnosis not present

## 2020-05-03 DIAGNOSIS — I10 Essential (primary) hypertension: Secondary | ICD-10-CM | POA: Diagnosis not present

## 2020-05-27 DIAGNOSIS — Z809 Family history of malignant neoplasm, unspecified: Secondary | ICD-10-CM | POA: Diagnosis not present

## 2020-05-27 DIAGNOSIS — I69354 Hemiplegia and hemiparesis following cerebral infarction affecting left non-dominant side: Secondary | ICD-10-CM | POA: Diagnosis not present

## 2020-05-27 DIAGNOSIS — I2510 Atherosclerotic heart disease of native coronary artery without angina pectoris: Secondary | ICD-10-CM | POA: Diagnosis not present

## 2020-05-27 DIAGNOSIS — Z8249 Family history of ischemic heart disease and other diseases of the circulatory system: Secondary | ICD-10-CM | POA: Diagnosis not present

## 2020-05-27 DIAGNOSIS — Z9181 History of falling: Secondary | ICD-10-CM | POA: Diagnosis not present

## 2020-05-27 DIAGNOSIS — Z880 Allergy status to penicillin: Secondary | ICD-10-CM | POA: Diagnosis not present

## 2020-05-27 DIAGNOSIS — Z87891 Personal history of nicotine dependence: Secondary | ICD-10-CM | POA: Diagnosis not present

## 2020-05-27 DIAGNOSIS — I10 Essential (primary) hypertension: Secondary | ICD-10-CM | POA: Diagnosis not present

## 2020-05-27 DIAGNOSIS — Z823 Family history of stroke: Secondary | ICD-10-CM | POA: Diagnosis not present

## 2020-05-28 DIAGNOSIS — Z1231 Encounter for screening mammogram for malignant neoplasm of breast: Secondary | ICD-10-CM | POA: Diagnosis not present

## 2020-06-02 DIAGNOSIS — I10 Essential (primary) hypertension: Secondary | ICD-10-CM | POA: Diagnosis not present

## 2020-06-03 DIAGNOSIS — N819 Female genital prolapse, unspecified: Secondary | ICD-10-CM | POA: Insufficient documentation

## 2020-06-03 HISTORY — DX: Female genital prolapse, unspecified: N81.9

## 2020-06-24 DIAGNOSIS — Z1231 Encounter for screening mammogram for malignant neoplasm of breast: Secondary | ICD-10-CM | POA: Diagnosis not present

## 2020-06-29 ENCOUNTER — Other Ambulatory Visit: Payer: Self-pay | Admitting: *Deleted

## 2020-06-29 ENCOUNTER — Encounter: Payer: Self-pay | Admitting: *Deleted

## 2020-06-29 DIAGNOSIS — Z20828 Contact with and (suspected) exposure to other viral communicable diseases: Secondary | ICD-10-CM | POA: Diagnosis not present

## 2020-06-29 NOTE — Patient Outreach (Signed)
  [facility]) Care Management  [place]  06/29/2020   Jaqueline Uber [birth year] [id number]  Subjective: Telephone outreach for [place] referral for care management. Conducted this interview in [place].  Encounter Medications:  Outpatient Encounter Medications as of 06/29/2020  Medication Sig Note   aspirin 81 MG EC tablet Take 81 mg by mouth daily.    atorvastatin (LIPITOR) 80 MG tablet TAKE 1 TABLET (80 MG TOTAL) BY MOUTH DAILY AT 6 PM    hydrALAZINE (APRESOLINE) 50 MG tablet Take 1 tablet (50 mg total) by mouth 3 (three) times daily.    diltiazem (CARDIZEM CD) 180 MG 24 hr capsule Take 1 capsule (180 mg total) by mouth daily. 06/29/2020: Pt reports she IS taking this medication.   [DISCONTINUED] hydrochlorothiazide (HYDRODIURIL) 25 MG tablet Take 25 mg by mouth daily. (Patient not taking: Reported on 06/29/2020)    [DISCONTINUED] ondansetron (ZOFRAN) 4 MG tablet Take 1 tablet (4 mg total) by mouth every 8 (eight) hours as needed for nausea or vomiting. (Patient not taking: Reported on 06/29/2020)    [DISCONTINUED] TIADYLT ER 180 MG 24 hr capsule Take 180 mg by mouth at bedtime. (Patient not taking: Reported on 06/29/2020)    No facility-administered encounter medications on file as of 06/29/2020.    Functional Status:  In your present state of health, do you have any difficulty performing the following activities: 11/11/2019 11/08/2019  Hearing? N N  Vision? N N  Difficulty concentrating or making decisions? N N  Walking or climbing stairs? Y Y  Dressing or bathing? N N  Doing errands, shopping? Y Y  Some recent data might be hidden    Fall/Depression Screening: Fall Risk  06/29/2020 06/29/2020  Falls in the past year? 0 0  Number falls in past yr: 0 0  Risk for fall due to : Impaired balance/gait;Medication side effect;Impaired mobility Impaired balance/gait;Medication side effect   PHQ 2/9 Scores 06/29/2020  PHQ - 2 Score 0   Assessment: CAD                         HTN                        Hx CVA                        Hyperlipidemia                        PVD  Barriers: Health Behaviors Knowledge Linguistic     Goals Addressed             This Visit's Progress    Improve My Heart Health-Coronary Artery Disease       Track and Manage My Blood Pressure-Hypertension          Plan: Pt will go to her home land this week for 2 weeks. We agreed to talk again in one week. Will send HCPOA and Living Will for completion.  Follow-up: Patient agrees to Care Plan and Follow-up. Follow-up in 1 month(s)  Polk Minor C. [name], MSN, [facility] Gerontological Nurse Practitioner [facility] Care Management [phone]

## 2020-07-02 DIAGNOSIS — I10 Essential (primary) hypertension: Secondary | ICD-10-CM | POA: Diagnosis not present

## 2020-07-16 ENCOUNTER — Encounter (HOSPITAL_COMMUNITY): Payer: Medicare HMO

## 2020-07-16 ENCOUNTER — Inpatient Hospital Stay (HOSPITAL_COMMUNITY): Admission: RE | Admit: 2020-07-16 | Payer: Medicare HMO | Source: Ambulatory Visit

## 2020-07-16 ENCOUNTER — Ambulatory Visit: Payer: Medicare HMO | Admitting: Vascular Surgery

## 2020-07-29 ENCOUNTER — Other Ambulatory Visit: Payer: Self-pay | Admitting: *Deleted

## 2020-07-29 NOTE — Patient Outreach (Signed)
[facility]) Care Management  [place]  07/29/2020   Amaria Zervas 1955/09/07 [id number]  Subjective: Telephone outreach to follow up on heart and BP health.  Encounter Medications:  Outpatient Encounter Medications as of 07/29/2020  Medication Sig Note   aspirin 81 MG EC tablet Take 81 mg by mouth daily.    atorvastatin (LIPITOR) 80 MG tablet TAKE 1 TABLET (80 MG TOTAL) BY MOUTH DAILY AT 6 PM    diltiazem (CARDIZEM CD) 180 MG 24 hr capsule Take 1 capsule (180 mg total) by mouth daily. 06/29/2020: Pt reports she IS taking this medication.   hydrALAZINE (APRESOLINE) 50 MG tablet Take 1 tablet (50 mg total) by mouth 3 (three) times daily.    No facility-administered encounter medications on file as of 07/29/2020.    Functional Status:  In your present state of health, do you have any difficulty performing the following activities: 11/11/2019 11/08/2019  Hearing? N N  Vision? N N  Difficulty concentrating or making decisions? N N  Walking or climbing stairs? Y Y  Dressing or bathing? N N  Doing errands, shopping? Y Y  Some recent data might be hidden    Fall/Depression Screening: Fall Risk  06/29/2020 06/29/2020  Falls in the past year? 0 0  Number falls in past yr: 0 0  Risk for fall due to : Impaired balance/gait;Medication side effect;Impaired mobility Impaired balance/gait;Medication side effect   PHQ 2/9 Scores 06/29/2020  PHQ - 2 Score 0    Assessment: HTN controlled  Care Plan   Goals Addressed             This Visit's Progress    Improve My Heart Health-Coronary Artery Disease   On track    Timeframe:  Long-Range Goal Priority:  Medium Start Date:   06/29/20                          Expected End Date:  10/02/20    Barriers: Cultural Health Behaviors Knowledge Linguistic                  Follow Up Date 08/01/2020    - be open to making changes - I can manage, know and watch for signs of a heart attack - if I have chest pain, call  for help - learn about small changes that will make a big difference - learn my personal risk factors    Why is this important?   Lifestyle changes are key to improving the blood flow to your heart. Think about the things you can change and set a goal to live healthy.  Remember, when the blood vessels to your heart start to get clogged you may not have any symptoms.  Over time, they can get worse.  Don't ignore the signs, like chest pain, and get help right away.     Notes: 07/29/20 Conversation in [place].Pt has not looked through her mail yet for the information that was sent. She does have the BP cuff and is using it daily. Encouraged lots of fruits and vegetables, avoid fatty and fried foods. Taking her medications as ordered. Encouraged daily exercise.     Track and Manage My Blood Pressure-Hypertension   On track    Timeframe:  Long-Range Goal Priority:  Medium Start Date:   06/29/20  Expected End Date:  10/02/20 Barriers: Cultural Health Behaviors Knowledge Linguistic                     Follow Up Date 09/01/20   - check blood pressure 3 times per week - write blood pressure results in a log or diary    Why is this important?   You won't feel high blood pressure, but it can still hurt your blood vessels.  High blood pressure can cause heart or kidney problems. It can also cause a stroke.  Making lifestyle changes like losing a little weight or eating less salt will help.  Checking your blood pressure at home and at different times of the day can help to control blood pressure.  If the doctor prescribes medicine remember to take it the way the doctor ordered.  Call the office if you cannot afford the medicine or if there are questions about it.     Notes: 07/29/20 Received her BP monitor and is checking it daily. Pressures are <140/70 per pt report. Taking her meds as ordered. She is working on improving her diet, reducing fried and fatty foods. Reinforced  diet.        Plan:  Follow-up: Patient agrees to Care Plan and Follow-up. Follow-up in 1 month(s)  Simon Llamas C. [name], MSN, [facility] Gerontological Nurse Practitioner [facility] Care Management [phone]

## 2020-08-02 DIAGNOSIS — I10 Essential (primary) hypertension: Secondary | ICD-10-CM | POA: Diagnosis not present

## 2020-08-03 DIAGNOSIS — N819 Female genital prolapse, unspecified: Secondary | ICD-10-CM | POA: Diagnosis not present

## 2020-08-12 DIAGNOSIS — Z136 Encounter for screening for cardiovascular disorders: Secondary | ICD-10-CM | POA: Diagnosis not present

## 2020-08-12 DIAGNOSIS — Z1331 Encounter for screening for depression: Secondary | ICD-10-CM | POA: Diagnosis not present

## 2020-08-12 DIAGNOSIS — Z139 Encounter for screening, unspecified: Secondary | ICD-10-CM | POA: Diagnosis not present

## 2020-08-12 DIAGNOSIS — Z0000 Encounter for general adult medical examination without abnormal findings: Secondary | ICD-10-CM | POA: Diagnosis not present

## 2020-08-13 ENCOUNTER — Ambulatory Visit: Payer: Medicare HMO | Admitting: Adult Health

## 2020-08-19 DIAGNOSIS — I10 Essential (primary) hypertension: Secondary | ICD-10-CM | POA: Diagnosis not present

## 2020-08-19 DIAGNOSIS — Z6820 Body mass index (BMI) 20.0-20.9, adult: Secondary | ICD-10-CM | POA: Diagnosis not present

## 2020-08-19 DIAGNOSIS — I700 Atherosclerosis of aorta: Secondary | ICD-10-CM | POA: Diagnosis not present

## 2020-08-27 ENCOUNTER — Other Ambulatory Visit: Payer: Self-pay | Admitting: *Deleted

## 2020-08-27 NOTE — Patient Outreach (Signed)
[name]) Care Management  [place]  08/27/2020   Katherine Archer [birth year] [id number]  Subjective: Telephone follow up for CAD, HTN  Encounter Medications:  Outpatient Encounter Medications as of 08/27/2020  Medication Sig Note   conjugated estrogens (PREMARIN) vaginal cream Place 1 Applicatorful vaginally daily. Please put directions in Spanish: small amount into vaginal daily for 2 weeks, then QOD for 2 weeks, then 2 times a week for next 4 months.    aspirin 81 MG EC tablet Take 81 mg by mouth daily.    atorvastatin (LIPITOR) 80 MG tablet TAKE 1 TABLET (80 MG TOTAL) BY MOUTH DAILY AT 6 PM    diltiazem (CARDIZEM CD) 180 MG 24 hr capsule Take 1 capsule (180 mg total) by mouth daily. 06/29/2020: Pt reports she IS taking this medication.   hydrALAZINE (APRESOLINE) 50 MG tablet Take 1 tablet (50 mg total) by mouth 3 (three) times daily.    No facility-administered encounter medications on file as of 08/27/2020.   Functional Status:  In your present state of health, do you have any difficulty performing the following activities: 11/11/2019 11/08/2019  Hearing? N N  Vision? N N  Difficulty concentrating or making decisions? N N  Walking or climbing stairs? Y Y  Dressing or bathing? N N  Doing errands, shopping? Y Y  Some recent data might be hidden   Fall/Depression Screening: Fall Risk  06/29/2020 06/29/2020  Falls in the past year? 0 0  Number falls in past yr: 0 0  Risk for fall due to : Impaired balance/gait;Medication side effect;Impaired mobility Impaired balance/gait;Medication side effect   PHQ 2/9 Scores 06/29/2020  PHQ - 2 Score 0   Assessment: CAD                         HTN not at goal                        Needs meds delivered! Change of pharmacy to [facility] on [address]. [place].  Care Plan   Goals Addressed             This Visit's Progress    Improve My Heart Health-Coronary Artery Disease   On track    Timeframe:   Long-Range Goal Priority:  Medium Start Date:   06/29/20                          Expected End Date:  10/02/20    Barriers: Cultural Health Behaviors Knowledge Linguistic                  Follow Up Date 08/01/2020    - be open to making changes - I can manage, know and watch for signs of a heart attack - if I have chest pain, call for help - learn about small changes that will make a big difference - learn my personal risk factors    Why is this important?   Lifestyle changes are key to improving the blood flow to your heart. Think about the things you can change and set a goal to live healthy.  Remember, when the blood vessels to your heart start to get clogged you may not have any symptoms.  Over time, they can get worse.  Don't ignore the signs, like chest pain, and get help right away.     Notes: 08/27/20 Has received the [facility]  information. She is without any CVD sxs. She is taking her medications, following a heart healthy diet, no added salt and choosing low fat foods.  Encouraged to always follow these lifestyle CVD recommendations to prevent complications.07/29/20 Conversation in [place].Pt has not looked through her mail yet for the information that was sent. She does have the BP cuff and is using it daily. Encouraged lots of fruits and vegetables, avoid fatty and fried foods. Taking her medications as ordered. Encouraged daily exercise.     Medication Adherence Maintained by pt report each month over 3 months   On track    Start date 08/27/20 High Priority Long term goal Expected completion: 12/02/20 Follow up 10/02/20 Barriers: Transportation  Evidence-based guidance:  Develop a complete and accurate medication list including those prescribed and over-the-counter, those taken only occasionally and those not taken by mouth such as injections, inhalers, ointments or creams and drops.  Review all medications to determine if patient or caregiver knows why the medications are given  and if taken as prescribed.  Complete or review a medication adherence assessment including barriers to medication adherence.  Assess barriers to medication adherence.  Manage poor understanding or health literacy by using easy to understand language, teach-back, Consult with provider and/or pharmacist, new pharmacy for delivery.   Notes: 08/27/20 Pt requested assistance to get her medications delivered. Current pharmacy does not provide this service. Pt says she thinks [facility] in same block as [facility] does provide this service. Verified and this is true. NP to arrange for transition for improved pt compliance with regular dosing of her medication. Request med instructions be given in Spanish.     Track and Manage My Blood Pressure-Hypertension   On track    Timeframe:  Long-Range Goal Priority:  Medium Start Date:   06/29/20                          Expected End Date:  10/02/20 Barriers: Cultural Health Behaviors Knowledge Linguistic                     Follow Up Date 09/01/20   - check blood pressure 3 times per week - write blood pressure results in a log or diary    Why is this important?   You won't feel high blood pressure, but it can still hurt your blood vessels.  High blood pressure can cause heart or kidney problems. It can also cause a stroke.  Making lifestyle changes like losing a little weight or eating less salt will help.  Checking your blood pressure at home and at different times of the day can help to control blood pressure.  If the doctor prescribes medicine remember to take it the way the doctor ordered.  Call the office if you cannot afford the medicine or if there are questions about it.     Notes: 08/27/20 Reports most recent readings have been 174/88 and 156/68. Advised this readings are high. Recommend she measure her BP 2 hours after taking her medication and then again later in the day. Write the values in the proved BP log. 07/29/20 Received her BP monitor and is  checking it daily. Pressures are <140/70 per pt report. Taking her meds as ordered. She is working on improving her diet, reducing fried and fatty foods. Reinforced diet.        Plan: Follow up in 2 weeks Follow-up: Patient agrees to Care Plan and Follow-up. Follow-up in  2 week(s)  Kayleen Memos C. [name], MSN, [facility] Gerontological Nurse Practitioner [facility] Care Management [phone]

## 2020-09-02 DIAGNOSIS — I10 Essential (primary) hypertension: Secondary | ICD-10-CM | POA: Diagnosis not present

## 2020-09-08 DIAGNOSIS — Z4689 Encounter for fitting and adjustment of other specified devices: Secondary | ICD-10-CM | POA: Diagnosis not present

## 2020-09-10 ENCOUNTER — Other Ambulatory Visit: Payer: Self-pay | Admitting: Vascular Surgery

## 2020-09-10 ENCOUNTER — Other Ambulatory Visit: Payer: Self-pay

## 2020-09-10 MED ORDER — ATORVASTATIN CALCIUM 80 MG PO TABS: 80.0000 mg | ORAL_TABLET | Freq: Every day | ORAL | 0 refills | Status: DC

## 2020-09-16 DIAGNOSIS — Z4689 Encounter for fitting and adjustment of other specified devices: Secondary | ICD-10-CM | POA: Diagnosis not present

## 2020-09-23 DIAGNOSIS — I10 Essential (primary) hypertension: Secondary | ICD-10-CM | POA: Diagnosis not present

## 2020-09-23 DIAGNOSIS — Z6820 Body mass index (BMI) 20.0-20.9, adult: Secondary | ICD-10-CM | POA: Diagnosis not present

## 2020-09-29 ENCOUNTER — Other Ambulatory Visit: Payer: Self-pay | Admitting: *Deleted

## 2020-09-29 NOTE — Patient Outreach (Signed)
[facility]) Care Management  [place]  09/29/2020   Katherine Archer April 03, 1955 [id number]  Subjective: Telephone outreach for follow up CVD and GYN visit.  Pt had pessary placed this month and she has been feeling much more comfortable with the device.  Encounter Medications:  Outpatient Encounter Medications as of 09/29/2020  Medication Sig Note   aspirin 81 MG EC tablet Take 81 mg by mouth daily.    atorvastatin (LIPITOR) 80 MG tablet TAKE 1 TABLET (80 MG TOTAL) BY MOUTH DAILY AT 6 PM    atorvastatin (LIPITOR) 80 MG tablet Take 1 tablet (80 mg total) by mouth daily. MUST MAKE APPT FOR FURTHER REFILLS    conjugated estrogens (PREMARIN) vaginal cream Place 1 Applicatorful vaginally daily. Please put directions in Spanish: small amount into vaginal daily for 2 weeks, then QOD for 2 weeks, then 2 times a week for next 4 months.    diltiazem (CARDIZEM CD) 180 MG 24 hr capsule Take 1 capsule (180 mg total) by mouth daily. 06/29/2020: Pt reports she IS taking this medication.   hydrALAZINE (APRESOLINE) 50 MG tablet Take 1 tablet (50 mg total) by mouth 3 (three) times daily.    No facility-administered encounter medications on file as of 09/29/2020.    Functional Status:  In your present state of health, do you have any difficulty performing the following activities: 11/11/2019 11/08/2019  Hearing? N N  Vision? N N  Difficulty concentrating or making decisions? N N  Walking or climbing stairs? Y Y  Dressing or bathing? N N  Doing errands, shopping? Y Y  Some recent data might be hidden    Fall/Depression Screening: Fall Risk  06/29/2020 06/29/2020  Falls in the past year? 0 0  Number falls in past yr: 0 0  Risk for fall due to : Impaired balance/gait;Medication side effect;Impaired mobility Impaired balance/gait;Medication side effect   PHQ 2/9 Scores 06/29/2020  PHQ - 2 Score 0    Assessment: CAD, HTN stable  Care Plan   Goals Addressed              This Visit's Progress    COMPLETED: Improve My Heart Health-Coronary Artery Disease       Timeframe:  Long-Range Goal Priority:  Medium Start Date:   06/29/20                          Expected End Date:  10/02/20    Barriers: Cultural Health Behaviors Knowledge Linguistic                  Follow Up Date   - be open to making changes - I can manage, know and watch for signs of a heart attack - if I have chest pain, call for help - learn about small changes that will make a big difference - learn my personal risk factors    Why is this important?   Lifestyle changes are key to improving the blood flow to your heart. Think about the things you can change and set a goal to live healthy.  Remember, when the blood vessels to your heart start to get clogged you may not have any symptoms.  Over time, they can get worse.  Don't ignore the signs, like chest pain, and get help right away.     Notes: 09/29/20 [name] is doing well with following a heart healthy diet. She is following recommendations. Praised for taking her health seriously  and doing what she can herself to stay healthy. No cardiac sxs reported. Goal completed. 08/27/20 Has received the [facility] information. She is without any CVD sxs. She is taking her medications, following a heart healthy diet, no added salt and choosing low fat foods.  Encouraged to always follow these lifestyle CVD recommendations to prevent complications.07/29/20 Conversation in [place].Pt has not looked through her mail yet for the information that was sent. She does have the BP cuff and is using it daily. Encouraged lots of fruits and vegetables, avoid fatty and fried foods. Taking her medications as ordered. Encouraged daily exercise.     COMPLETED: Medication Adherence Maintained by pt report each month over 3 months       Start date 08/27/20 High Priority Long term goal Expected completion: 12/02/20 Follow up 10/02/20 Barriers: Transportation  Evidence-based  guidance:  Develop a complete and accurate medication list including those prescribed and over-the-counter, those taken only occasionally and those not taken by mouth such as injections, inhalers, ointments or creams and drops.  Review all medications to determine if patient or caregiver knows why the medications are given and if taken as prescribed.  Complete or review a medication adherence assessment including barriers to medication adherence.  Assess barriers to medication adherence.  Manage poor understanding or health literacy by using easy to understand language, teach-back, Consult with provider and/or pharmacist, new pharmacy for delivery.   Notes:09/29/20 Pt now is getting her meds delivered. She has all her meds and is taking them routinely! Goal met.  08/27/20 Pt requested assistance to get her medications delivered. Current pharmacy does not provide this service. Pt says she thinks [facility] block [facility] does provide this service. Verified and this is true. NP to arrange for transition for improved pt compliance with regular dosing of her medication. Request med instructions be given in Spanish.     Patient Stated       Will get flu vaccine in the next 3 months.     Track and Manage My Blood Pressure-Hypertension   On track    Timeframe:  Long-Range Goal Priority:  Medium Start Date:   06/29/20                          Expected End Date:  10/02/20 Barriers: Cultural Health Behaviors Knowledge Linguistic                     Follow Up Date 09/01/20   - check blood pressure 3 times per week - write blood pressure results in a log or diary    Why is this important?   You won't feel high blood pressure, but it can still hurt your blood vessels.  High blood pressure can cause heart or kidney problems. It can also cause a stroke.  Making lifestyle changes like losing a little weight or eating less salt will help.  Checking your blood pressure at home and at different times  of the day can help to control blood pressure.  If the doctor prescribes medicine remember to take it the way the doctor ordered.  Call the office if you cannot afford the medicine or if there are questions about it.     Notes: 09/29/20 Pt report high reading 150/90. She is checking her BP several times a day and NP has advised she can check once a day now. Try to check it at different times a day if possible. Continue  your current lifestyle which supports your cardiovascular health. 08/27/20 Reports most recent readings have been 174/88 and 156/68. Advised this readings are high. Recommend she measure her BP 2 hours after taking her medication and then again later in the day. Write the values in the proved BP log. 07/29/20 Received her BP monitor and is checking it daily. Pressures are <140/70 per pt report. Taking her meds as ordered. She is working on improving her diet, reducing fried and fatty foods. Reinforced diet.        Plan:  Follow-up: Patient agrees to Care Plan and Follow-up. Follow-up in 1 month(s)  Danean Marner C. [name], MSN, [facility] Gerontological Nurse Practitioner [facility] Care Management [phone]

## 2020-10-01 ENCOUNTER — Other Ambulatory Visit: Payer: Self-pay | Admitting: Vascular Surgery

## 2020-10-01 DIAGNOSIS — Z4689 Encounter for fitting and adjustment of other specified devices: Secondary | ICD-10-CM | POA: Diagnosis not present

## 2020-10-01 NOTE — Telephone Encounter (Signed)
Please make appt

## 2020-10-02 DIAGNOSIS — I10 Essential (primary) hypertension: Secondary | ICD-10-CM | POA: Diagnosis not present

## 2020-10-17 ENCOUNTER — Other Ambulatory Visit: Payer: Self-pay | Admitting: Vascular Surgery

## 2020-10-21 DIAGNOSIS — M810 Age-related osteoporosis without current pathological fracture: Secondary | ICD-10-CM | POA: Diagnosis not present

## 2020-10-21 DIAGNOSIS — Z01419 Encounter for gynecological examination (general) (routine) without abnormal findings: Secondary | ICD-10-CM | POA: Diagnosis not present

## 2020-10-21 DIAGNOSIS — R69 Illness, unspecified: Secondary | ICD-10-CM | POA: Diagnosis not present

## 2020-10-21 DIAGNOSIS — Z0000 Encounter for general adult medical examination without abnormal findings: Secondary | ICD-10-CM | POA: Diagnosis not present

## 2020-10-29 ENCOUNTER — Encounter: Payer: Self-pay | Admitting: *Deleted

## 2020-10-29 ENCOUNTER — Other Ambulatory Visit: Payer: Self-pay | Admitting: *Deleted

## 2020-10-29 NOTE — Patient Outreach (Signed)
Katherine Archer) Care Management  10/29/2020  Katherine Archer May 12, 1955 [id number]  Telephone outreach to follow up on HTN and vaccination status. Conversation conducted in [place].   Goals Addressed             This Visit's Progress    Patient Stated   Not on track    Will get flu vaccine in the next 3 months.  Start date: 09/29/20 Expected end date 01/02/21 Priority - Medium Long term goal Barriers: Cultural Knowledge   Notes: 10/29/20 Had discussion with Katherine Archer today (in [place]) regarding the recommendation to have a flu vaccine and COVID vaccine. She states she has not taken vaccines and has decided she does not need them. NP to send CDC recommendations in Spanish to her and will inquire again next month.        Track and Manage My Blood Pressure-Hypertension   On track    Timeframe:  Long-Range Goal Priority:  Medium Start Date:   06/29/20                          Expected End Date:  10/02/20 Barriers: Cultural Health Behaviors Knowledge Linguistic                     Follow Up Date 12/02/20     Notes: 10/29/20 Pt is monitoring her blood pressure. It remains a bit higher than NP would like in the 150's/80-90's. She is taking her cardizem and hydralazine as ordered. She just saw Dr. Nyra Capes last week and no changes were made to her regimen. Encouraged her to follow low na diet and minimize fat. 09/29/20 Pt report high reading 150/90. She is checking her BP several times a day and NP has advised she can check once a day now. Try to check it at different times a day if possible. Continue your current lifestyle which supports your cardiovascular health. 08/27/20 Reports most recent readings have been 174/88 and 156/68. Advised this readings are high. Recommend she measure her BP 2 hours after taking her medication and then again later in the day. Write the values in the proved BP log. 07/29/20 Received her BP monitor and is checking it daily. Pressures are  <140/70 per pt report. Taking her meds as ordered. She is working on improving her diet, reducing fried and fatty foods. Reinforced diet.       Assessment: HTN above goal                       Has not received flu or COVID vaccinations  Plan: We agreed on the plan to continue BP monitoring and NP to send vaccination info. We agreed to talk again in a month.  [name]. [name], MSN, [facility] Gerontological Nurse Practitioner [facility] Care Management [phone]

## 2020-11-02 DIAGNOSIS — I10 Essential (primary) hypertension: Secondary | ICD-10-CM | POA: Diagnosis not present

## 2020-11-02 DIAGNOSIS — Z4689 Encounter for fitting and adjustment of other specified devices: Secondary | ICD-10-CM | POA: Diagnosis not present

## 2020-11-20 DIAGNOSIS — I10 Essential (primary) hypertension: Secondary | ICD-10-CM | POA: Diagnosis not present

## 2020-11-20 DIAGNOSIS — Z6820 Body mass index (BMI) 20.0-20.9, adult: Secondary | ICD-10-CM | POA: Diagnosis not present

## 2020-11-30 ENCOUNTER — Other Ambulatory Visit: Payer: Self-pay | Admitting: *Deleted

## 2020-12-01 DIAGNOSIS — Z789 Other specified health status: Secondary | ICD-10-CM | POA: Diagnosis not present

## 2020-12-01 DIAGNOSIS — I739 Peripheral vascular disease, unspecified: Secondary | ICD-10-CM | POA: Diagnosis not present

## 2020-12-01 DIAGNOSIS — I10 Essential (primary) hypertension: Secondary | ICD-10-CM | POA: Diagnosis not present

## 2020-12-01 DIAGNOSIS — Z6820 Body mass index (BMI) 20.0-20.9, adult: Secondary | ICD-10-CM | POA: Diagnosis not present

## 2020-12-02 ENCOUNTER — Ambulatory Visit: Payer: Medicare HMO | Admitting: *Deleted

## 2020-12-02 DIAGNOSIS — I10 Essential (primary) hypertension: Secondary | ICD-10-CM | POA: Diagnosis not present

## 2020-12-03 DIAGNOSIS — E785 Hyperlipidemia, unspecified: Secondary | ICD-10-CM | POA: Diagnosis not present

## 2020-12-03 DIAGNOSIS — I10 Essential (primary) hypertension: Secondary | ICD-10-CM | POA: Diagnosis not present

## 2020-12-07 DIAGNOSIS — Z4689 Encounter for fitting and adjustment of other specified devices: Secondary | ICD-10-CM | POA: Diagnosis not present

## 2020-12-10 ENCOUNTER — Encounter: Payer: Self-pay | Admitting: *Deleted

## 2020-12-10 NOTE — Patient Outreach (Signed)
[name]) Care Management Geriatric Nurse Practitioner Note   12/10/2020 Name:  Katherine Archer MRN:  [id number] DOB:  [birth year]  Summary: Pt is stable. Continues to refuse immunizations despite given information in [place]. States she has a strong immune system.  Recommendations/Changes made from today's visit: Would highly recommended flu and COVID vaccines. Will change to quarterly calls for care management.  Subjective: Katherine Archer is an 65 y.o. year old female who is a primary patient of Katherine Shivers, MD. The care management team was consulted for assistance with care management and/or care coordination needs.    Geriatric Nurse Practitioner completed Telephone Visit today.   Objective:  Medications Reviewed Today     Reviewed by Deloria Lair, NP (Nurse Practitioner) on 11/30/20 at 1252  Med List Status: <None>   Medication Order Taking? Sig Documenting Provider Last Dose Status Informant  aspirin 81 MG EC tablet [id number]  Take 81 mg by mouth daily. [provider]  Active Self  atorvastatin (LIPITOR) 80 MG tablet [id number]  Take 1 tablet (80 mg total) by mouth daily. MUST MAKE APPT FOR FURTHER REFILLS Angelia Mould, MD  Active   atorvastatin (LIPITOR) 80 MG tablet [id number]  TAKE 1 TABLET BY MOUTH DAILY AT 6 PM. Angelia Mould, MD  Active   conjugated estrogens (PREMARIN) vaginal cream [id number]  Place 1 Applicatorful vaginally daily. Please put directions in Spanish: small amount into vaginal daily for 2 weeks, then QOD for 2 weeks, then 2 times a week for next 4 months. [provider]  Active Other  diltiazem (CARDIZEM CD) 180 MG 24 hr capsule [id number]  Take 1 capsule (180 mg total) by mouth daily. Berniece Salines, DO  Expired 05/12/20 2359            Med Note Katherine Archer, Katherine Archer   Mon Jun 29, 2020 11:31 AM) Pt reports she IS taking this medication.  hydrALAZINE (APRESOLINE) 50 MG tablet [id number]  Take 1 tablet (50 mg  total) by mouth 3 (three) times daily. Berniece Salines, DO  Expired 07/07/20 2359              SDOH:  (Social Determinants of Health) assessments and interventions performed:    Care Plan  Review of patient past medical history, allergies, medications, health status, including review of consultants reports, laboratory and other test data, was performed as part of comprehensive evaluation for care management services.   DISCUSSED PROPOSED GOALS WITH PT IN SPANISH.  Care Plan : RN Care Manager Plan of Care  Updates made by Deloria Lair, NP since 12/10/2020 12:00 AM     Problem: Health Literacy (Wellness)      Long-Range Goal: Health Literacy Improved by pt verbalization of receiving infor in Spanish and understanding medical instructions for chronic disease managment (HTN & CAD).   Start Date: 06/29/2020  Expected End Date: 04/02/2021  Priority: High  Note:   Current Barriers:  Language Barrier  RNCM Clinical Goal(s):  Patient will verbalize basic understanding of  CAD and HTN disease process and self health management plan as evidenced by reporting home monitoring results, verifying low salt. Heart healthy diet. take all medications exactly as prescribed and will call provider for medication related questions as evidenced by incoming calls from pt to NP or MD office in chart. attend all scheduled medical appointments:   as evidenced by pt report and chart review. demonstrate Ongoing health management independence as evidenced by continued BP monitoring  through collaboration with RN Care manager, provider,  and care team.   Interventions: Inter-disciplinary care team collaboration (see longitudinal plan of care) Evaluation of current treatment plan related to  self management and patient's adherence to plan as established by provider  Multiple Co-Morbidities: HTN, CAD  Patient Goals/Self-Care Activities: Pt to work on doing these tasks daily or whenever ordered over the next 3  months as evidenced by pt report and chart review. Take all medications as prescribed Attend all scheduled provider appointments Call pharmacy for medication refills 3-7 days in advance of running out of medications Call provider office for new concerns or questions  check blood pressure daily write blood pressure results in a log or diary take blood pressure log to all doctor appointments call doctor for signs and symptoms of high blood pressure keep all doctor appointments take medications for blood pressure exactly as prescribed begin an exercise program report new symptoms to your doctor eat more whole grains, fruits and vegetables, lean meats and healthy fats limit salt intake to 2000 mg/day  Follow Up Plan:  We agreed to start quarterly update calls, f/u in February.  Katherine Archer. Katherine Neither, MSN, GNP-BC Gerontological Nurse Practitioner [facility] Care Management [phone]         Plan: Telephone follow up appointment with care management team member scheduled for:  February  Jinelle Butchko C. Katherine Neither, MSN, [facility] Gerontological Nurse Practitioner [facility] Care Management [phone]

## 2020-12-10 NOTE — Patient Instructions (Signed)
Visit Information  Thank you for taking time to visit with me today. Please don't hesitate to contact me if I can be of assistance to you before our next scheduled telephone appointment.  Following are the goals we discussed today:  (Copy and paste patient goals from clinical care plan here)  Our next appointment is by telephone on FEBRUARY  28TH at 10:00 AM.  Following is a copy of your care plan:  Care Plan : [place] of Care  Updates made by Deloria Lair, NP since 12/10/2020 12:00 AM     Problem: Health Literacy (Wellness)      Long-Range Goal: Health Literacy Improved by pt verbalization of receiving infor in Spanish and understanding medical instructions for chronic disease managment (HTN & CAD).   Start Date: 06/29/2020  Expected End Date: 04/02/2021  Priority: High  Note:   Current Barriers:  Language Barrier  RNCM Clinical Goal(s):  Patient will verbalize basic understanding of  CAD and HTN disease process and self health management plan as evidenced by reporting home monitoring results, verifying low salt. Heart healthy diet. take all medications exactly as prescribed and will call provider for medication related questions as evidenced by incoming calls from pt to NP or MD office in chart. attend all scheduled medical appointments:   as evidenced by pt report and chart review. demonstrate Ongoing health management independence as evidenced by continued BP monitoring  through collaboration with RN Care manager, provider, and care team.   Interventions: Inter-disciplinary care team collaboration (see longitudinal plan of care) Evaluation of current treatment plan related to  self management and patient's adherence to plan as established by provider  Multiple Co-Morbidities: HTN, CAD  Patient Goals/Self-Care Activities: Pt to work on doing these tasks daily or whenever ordered over the next 3 months as evidenced by pt report and chart review. Take all  medications as prescribed Attend all scheduled provider appointments Call pharmacy for medication refills 3-7 days in advance of running out of medications Call provider office for new concerns or questions  check blood pressure daily write blood pressure results in a log or diary take blood pressure log to all doctor appointments call doctor for signs and symptoms of high blood pressure keep all doctor appointments take medications for blood pressure exactly as prescribed begin an exercise program report new symptoms to your doctor eat more whole grains, fruits and vegetables, lean meats and healthy fats limit salt intake to 2000 mg/day  Follow Up Plan:  We agreed to start quarterly update calls, f/u in February.  Katherine Pont. [name], MSN, GNP-BC Gerontological Nurse Practitioner [facility] Care Management [phone]        The patient verbalized understanding of instructions, educational materials, and care plan provided today and agreed to receive a mailed copy of patient instructions, educational materials, and care plan.   Telephone follow up appointment with care management team member scheduled for: 03/02/2021 AT 10:00 AM  Kayleen Memos C. [name], MSN, [facility] Gerontological Nurse Practitioner [facility] Care Management [phone]

## 2020-12-15 ENCOUNTER — Ambulatory Visit: Payer: Medicare HMO | Admitting: Adult Health
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
## 2020-12-16 ENCOUNTER — Telehealth: Payer: Self-pay | Admitting: Cardiology

## 2020-12-16 NOTE — Telephone Encounter (Signed)
New Message:      Patient's  daughter would like to talk to Dr Harriet Masson or D. Tobb's nurse. She would like for Dr Harriet Masson to refer patient to another [facility] doctor please. She will give the details when she receives a call from Dr Harriet Masson or her nurse.

## 2020-12-16 NOTE — Telephone Encounter (Signed)
Spoke to patient's daughter [name].She stated her mother sees Dr.Fields at [facility].Stated she missed her last appointment with him in July and she thought appointment was rescheduled to yesterday 12/13.Stated she was told she did not have a appointment.Stated Dr.Fields has retired.She needs her mother to see another vascular Dr. Damaris Schooner to [facility] appointment rescheduled to 01/21/21 at 1:00 pm.She will have 3 U/S of right leg done before she sees PA at 2:45 pm. Advised I will make Dr.Todd her her RN aware.

## 2020-12-21 NOTE — Telephone Encounter (Signed)
Please let her know that the office can assign her to a new vascular surgeon, I would prefer for her to work with the vascular office

## 2020-12-22 ENCOUNTER — Telehealth: Payer: Self-pay

## 2020-12-22 NOTE — Telephone Encounter (Signed)
Called pt's daughter. She states they have already called and been set up with another vascular office completely. She thanked me for reaching out in regards to the matter.

## 2021-01-01 DIAGNOSIS — I10 Essential (primary) hypertension: Secondary | ICD-10-CM | POA: Diagnosis not present

## 2021-01-12 DIAGNOSIS — E785 Hyperlipidemia, unspecified: Secondary | ICD-10-CM | POA: Diagnosis not present

## 2021-01-19 DIAGNOSIS — Z6820 Body mass index (BMI) 20.0-20.9, adult: Secondary | ICD-10-CM | POA: Diagnosis not present

## 2021-01-19 DIAGNOSIS — I10 Essential (primary) hypertension: Secondary | ICD-10-CM | POA: Diagnosis not present

## 2021-01-19 DIAGNOSIS — E785 Hyperlipidemia, unspecified: Secondary | ICD-10-CM | POA: Diagnosis not present

## 2021-01-21 ENCOUNTER — Ambulatory Visit (HOSPITAL_COMMUNITY)
Admission: RE | Admit: 2021-01-21 | Discharge: 2021-01-21 | Disposition: A | Discharge status: Home / Self Care | Payer: Medicare HMO | Source: Ambulatory Visit | Attending: Vascular Surgery | Admitting: Vascular Surgery

## 2021-01-21 ENCOUNTER — Ambulatory Visit (INDEPENDENT_AMBULATORY_CARE_PROVIDER_SITE_OTHER): Payer: Medicare HMO | Admitting: Physician Assistant

## 2021-01-21 ENCOUNTER — Other Ambulatory Visit: Payer: Self-pay

## 2021-01-21 ENCOUNTER — Ambulatory Visit (INDEPENDENT_AMBULATORY_CARE_PROVIDER_SITE_OTHER)
Admission: RE | Admit: 2021-01-21 | Discharge: 2021-01-21 | Disposition: A | Discharge status: Home / Self Care | Payer: Medicare HMO | Source: Ambulatory Visit | Attending: Vascular Surgery | Admitting: Vascular Surgery

## 2021-01-21 VITALS — BP 121/70 | HR 62 | Temp 98.2°F | Resp 20 | Ht 66.0 in | Wt 127.9 lb

## 2021-01-21 DIAGNOSIS — R0989 Other specified symptoms and signs involving the circulatory and respiratory systems: Secondary | ICD-10-CM | POA: Diagnosis not present

## 2021-01-21 DIAGNOSIS — I739 Peripheral vascular disease, unspecified: Secondary | ICD-10-CM

## 2021-01-21 NOTE — Progress Notes (Signed)
HISTORY AND PHYSICAL     CC:  follow up. Requesting Provider:  [name], MD  HPI: This is a 66 y.o. female who is here today for follow up for PAD.  She has hx of right axillary bifemoral bypass grafting with bilateral common femoral artery endarterectomies for rest pain in both feet 11/11/2019 by Dr. Oneida Alar.   Pt was last seen April 2022 and at that time, she was doing well and her complaints of pain and numbness had resolved.   The pt returns today for follow up and accompanied by her daughter who is translating for her today as she does not speak very good english.  She denies any rest pain, claudication or non healing wounds.  She states she did recently stub her toe on the right but she does not have any wounds from this.  She does have some soreness over the ax-fem graft but it was not painful or sore to palpation.    The pt is on a statin for cholesterol management.    The pt is on an aspirin.    Other AC:  none The pt is on CCB and hydralazine for hypertension.  The pt does not have diabetes. Tobacco hx:  former    Past Medical History:  Diagnosis Date   Cervical polyp 09/18/2018   Critical lower limb ischemia (HCC)    Encounter for screening mammogram for breast cancer 05/05/2016   Fibrocystic breast changes of both breasts 04/29/2015   Hyperlipidemia    Hypertension    PAD (peripheral artery disease) ([place]) 08/23/2019   Stroke ([place]) 08/23/2019    Past Surgical History:  Procedure Laterality Date   ABDOMINAL AORTOGRAM W/LOWER EXTREMITY Bilateral 08/23/2019   Procedure: ABDOMINAL AORTOGRAM W/ Bilateral LOWER EXTREMITY Runoff;  Surgeon: [name], MD;  Location: [place] [facility];  Service: Cardiovascular;  Laterality: Bilateral;   AXILLARY-FEMORAL BYPASS GRAFT Right 11/11/2019   Procedure: BYPASS GRAFT RIGHT AXILLA-BIFEMORAL;  Surgeon: [name], MD;  Location: [facility];  Service: Vascular;  Laterality: Right;   BRAIN SURGERY     BREAST SURGERY  Bilateral 2014   1980 R breast surgery   CEREBRAL ANEURYSM REPAIR     ENDARTERECTOMY FEMORAL Bilateral 11/11/2019   Procedure: ENDARTERECTOMY FEMORAL BILATERALLY;  Surgeon: [name], MD;  [facility];  Service: Vascular;  Laterality: Bilateral;   IR RADIOLOGIST EVAL & MGMT  04/02/2020   TUBAL LIGATION  1990    Allergies  Allergen Reactions   Amoxicillin Other (See Comments)    Skin infection on face   Penicillins Other (See Comments)    Shaky and Dizziness 20 years ago    Current Outpatient Medications  Medication Sig Dispense Refill   aspirin 81 MG EC tablet Take 81 mg by mouth daily.     atorvastatin (LIPITOR) 80 MG tablet Take 1 tablet (80 mg total) by mouth daily. MUST MAKE APPT FOR FURTHER REFILLS 30 tablet 0   conjugated estrogens (PREMARIN) vaginal cream Place 1 Applicatorful vaginally daily. Please put directions in Spanish: small amount into vaginal daily for 2 weeks, then QOD for 2 weeks, then 2 times a week for next 4 months.     diltiazem (CARDIZEM CD) 180 MG 24 hr capsule Take 1 capsule (180 mg total) by mouth daily. 90 capsule 3   hydrALAZINE (APRESOLINE) 50 MG tablet Take 1 tablet (50 mg total) by mouth 3 (three) times daily. 270 tablet 3   No current facility-administered medications for this visit.  Family History  Problem Relation Age of Onset   Heart Problems Father    Heart Problems Brother    Heart Problems Brother     Social History   Socioeconomic History   Marital status: Divorced    Spouse name: Not on file   Number of children: Not on file   Years of education: Not on file   Highest education level: Not on file  Occupational History   Not on file  Tobacco Use   Smoking status: Former    Packs/day: 0.25    Types: Cigarettes    Quit date: 10/04/2019    Years since quitting: 1.3   Smokeless tobacco: Never  Vaping Use   Vaping Use: Never used  Substance and Sexual Activity   Alcohol use: No   Drug use: No   Sexual activity:  Not Currently  Other Topics Concern   Not on file  Social History Narrative   Not on file   Social Determinants of Health   Financial Resource Strain: Not on file  Food Insecurity: No Food Insecurity   Worried About Running Out of Food in the Last Year: Never true   [place] in the Last Year: Never true  Transportation Needs: No Transportation Needs   Lack of Transportation (Medical): No   Lack of Transportation (Non-Medical): No  Physical Activity: Not on file  Stress: Not on file  Social Connections: Not on file  Intimate Partner Violence: Not on file     REVIEW OF SYSTEMS:   [X]  denotes positive finding, [ ]  denotes negative finding Cardiac  Comments:  Chest pain or chest pressure:    Shortness of breath upon exertion:    Short of breath when lying flat:    Irregular heart rhythm:        Vascular    Pain in calf, thigh, or hip brought on by ambulation:    Pain in feet at night that wakes you up from your sleep:     Blood clot in your veins:    Leg swelling:         Pulmonary    Oxygen at home:    Productive cough:     Wheezing:         Neurologic    Sudden weakness in arms or legs:     Sudden numbness in arms or legs:     Sudden onset of difficulty speaking or slurred speech:    Temporary loss of vision in one eye:     Problems with dizziness:         Gastrointestinal    Blood in stool:     Vomited blood:         Genitourinary    Burning when urinating:     Blood in urine:        Psychiatric    Major depression:         Hematologic    Bleeding problems:    Problems with blood clotting too easily:        Skin    Rashes or ulcers:        Constitutional    Fever or chills:      PHYSICAL EXAMINATION:  Today's Vitals   01/21/21 1425  BP: 121/70  Pulse: 62  Resp: 20  Temp: 98.2 F (36.8 C)  TempSrc: Temporal  SpO2: 98%  Weight: 127 lb 14.4 oz (58 kg)  Height: 5\' 6"  (1.676 m)   Body mass index is 20.64  kg/m.   General:  WDWN  in NAD; vital signs documented above Gait: Not observed HENT: WNL, normocephalic Pulmonary: normal non-labored breathing , without wheezing Cardiac: regular HR, with carotid bruits right>left Abdomen: soft, NT, no masses; aortic pulse is not palpable Skin: without rashes Vascular Exam/Pulses:  Right Left  Radial 2+ (normal) 2+ (normal)  Femoral 2+ (normal) 2+ (normal)  DP Unable to palpate Unable to palpate  PT Unable to palpate Unable to palpate   Extremities: without ischemic changes, without Gangrene , without cellulitis; without open wounds;  Musculoskeletal: no muscle wasting or atrophy  Neurologic: A&O X 3 Psychiatric:  The pt has Normal affect.   Non-Invasive Vascular Imaging:   ABI's/TBI's on 01/21/2021: Right:  0.50/0 - Great toe pressure: 0 Left:  0.69/0.31 - Great toe pressure: 42  Arterial duplex on 01/21/2021: Summary:  Right: Patent right ax - bifem bypass graft with no evidence of restenosis  Previous ABI's/TBI's on 04/16/2020: Right:  0.39/0.25 - Great toe pressure: 39 Left:  0.57/0.36 - Great toe pressure:  56  Previous arterial duplex on 04/16/2020: Right: Patent axillary to bifemoral bypass graft. Increased velocity and  turbulent flow noted in the inflow artery and proximal anastomosis without  evidence of disease.    ASSESSMENT/PLAN:: 66 y.o. female here for follow up for hx of right axillary bifemoral bypass grafting with bilateral common femoral artery endarterectomies for rest pain in both feet 11/11/2019 by Dr. Oneida Alar.   PAD -pt's ABI have increased from last visit and she is not having any rest pain, claudication or non healing wounds. The axillary bifemoral bypass graft is also patent without evidence of restenosis.  -discussed with pt to continue to protect her feet.  Also discussed that if she develops rest pain or non healing wounds, we would need to see her back sooner.  -pt will f/u in 4 months with repeat studies before [place] leave for the  [place] in [place]. -continue statin/asa  Carotid bruits bilaterally with right>left -will bring pt back in the next couple of weeks to get carotid duplex as she has not had one in the past   Leontine Locket, [facility] [phone]  Clinic MD:   Scot Dock

## 2021-01-25 ENCOUNTER — Other Ambulatory Visit: Payer: Self-pay | Admitting: *Deleted

## 2021-01-25 ENCOUNTER — Inpatient Hospital Stay (HOSPITAL_COMMUNITY): Admission: RE | Admit: 2021-01-25 | Payer: Medicare HMO | Source: Ambulatory Visit

## 2021-01-25 ENCOUNTER — Ambulatory Visit: Payer: Medicare HMO

## 2021-01-25 DIAGNOSIS — I70229 Atherosclerosis of native arteries of extremities with rest pain, unspecified extremity: Secondary | ICD-10-CM

## 2021-01-25 DIAGNOSIS — I739 Peripheral vascular disease, unspecified: Secondary | ICD-10-CM

## 2021-01-25 DIAGNOSIS — R0989 Other specified symptoms and signs involving the circulatory and respiratory systems: Secondary | ICD-10-CM

## 2021-01-26 ENCOUNTER — Ambulatory Visit (INDEPENDENT_AMBULATORY_CARE_PROVIDER_SITE_OTHER): Payer: Medicare HMO | Admitting: Physician Assistant

## 2021-01-26 ENCOUNTER — Other Ambulatory Visit: Payer: Self-pay

## 2021-01-26 ENCOUNTER — Ambulatory Visit (HOSPITAL_COMMUNITY)
Admission: RE | Admit: 2021-01-26 | Discharge: 2021-01-26 | Disposition: A | Discharge status: Home / Self Care | Payer: Medicare HMO | Source: Ambulatory Visit | Attending: Surgery | Admitting: Surgery

## 2021-01-26 VITALS — BP 133/67 | HR 67 | Temp 97.5°F | Ht 66.0 in | Wt 125.7 lb

## 2021-01-26 DIAGNOSIS — R0989 Other specified symptoms and signs involving the circulatory and respiratory systems: Secondary | ICD-10-CM | POA: Insufficient documentation

## 2021-01-26 NOTE — Progress Notes (Signed)
Office Note     CC:  follow up Requesting Provider:  [name], MD  HPI: Katherine Archer is a 66 y.o. (11-02-1955) female who presents for evaluation of carotid bruit.  She was last seen last week in follow-up for PAD with history of right axillary bifemoral bypass grafting with bilateral common femoral artery endarterectomies by Dr. Oneida Alar 11/11/2019 due to rest pain.  On exam she was noted to have a bruit bilaterally.  She denies any strokelike symptoms including slurring speech, changes in vision, or one-sided weakness.   Past Medical History:  Diagnosis Date   Cervical polyp 09/18/2018   Critical lower limb ischemia (HCC)    Encounter for screening mammogram for breast cancer 05/05/2016   Fibrocystic breast changes of both breasts 04/29/2015   Hyperlipidemia    Hypertension    PAD (peripheral artery disease) ([place]) 08/23/2019   Stroke ([place]) 08/23/2019    Past Surgical History:  Procedure Laterality Date   ABDOMINAL AORTOGRAM W/LOWER EXTREMITY Bilateral 08/23/2019   Procedure: ABDOMINAL AORTOGRAM W/ Bilateral LOWER EXTREMITY Runoff;  Surgeon: [name], MD;  Location: [place] [facility];  Service: Cardiovascular;  Laterality: Bilateral;   AXILLARY-FEMORAL BYPASS GRAFT Right 11/11/2019   Procedure: BYPASS GRAFT RIGHT AXILLA-BIFEMORAL;  Surgeon: [name], MD;  Location: [facility];  Service: Vascular;  Laterality: Right;   BRAIN SURGERY     BREAST SURGERY Bilateral 2014   1980 R breast surgery   CEREBRAL ANEURYSM REPAIR     ENDARTERECTOMY FEMORAL Bilateral 11/11/2019   Procedure: ENDARTERECTOMY FEMORAL BILATERALLY;  Surgeon: [name], MD;  Location: [facility];  Service: Vascular;  Laterality: Bilateral;   IR RADIOLOGIST EVAL & MGMT  04/02/2020   TUBAL LIGATION  1990    Social History   Socioeconomic History   Marital status: Divorced    Spouse name: Not on file   Number of children: Not on file   Years of education: Not on file   Highest education level: Not on  file  Occupational History   Not on file  Tobacco Use   Smoking status: Former    Packs/day: 0.25    Types: Cigarettes    Quit date: 10/04/2019    Years since quitting: 1.3   Smokeless tobacco: Never  Vaping Use   Vaping Use: Never used  Substance and Sexual Activity   Alcohol use: No   Drug use: No   Sexual activity: Not Currently  Other Topics Concern   Not on file  Social History Narrative   Not on file   Social Determinants of Health   Financial Resource Strain: Not on file  Food Insecurity: No Food Insecurity   Worried About Running Out of Food in the Last Year: Never true   [place] in the Last Year: Never true  Transportation Needs: No Transportation Needs   Lack of Transportation (Medical): No   Lack of Transportation (Non-Medical): No  Physical Activity: Not on file  Stress: Not on file  Social Connections: Not on file  Intimate Partner Violence: Not on file    Family History  Problem Relation Age of Onset   Heart Problems Father    Heart Problems Brother    Heart Problems Brother     Current Outpatient Medications  Medication Sig Dispense Refill   aspirin 81 MG EC tablet Take 81 mg by mouth daily.     atorvastatin (LIPITOR) 80 MG tablet Take 1 tablet (80 mg total) by mouth daily. MUST MAKE APPT  FOR FURTHER REFILLS 30 tablet 0   conjugated estrogens (PREMARIN) vaginal cream Place 1 Applicatorful vaginally daily. Please put directions in Spanish: small amount into vaginal daily for 2 weeks, then QOD for 2 weeks, then 2 times a week for next 4 months.     diltiazem (CARDIZEM CD) 180 MG 24 hr capsule Take 1 capsule (180 mg total) by mouth daily. 90 capsule 3   hydrALAZINE (APRESOLINE) 50 MG tablet Take 1 tablet (50 mg total) by mouth 3 (three) times daily. 270 tablet 3   No current facility-administered medications for this visit.    Allergies  Allergen Reactions   Amoxicillin Other (See Comments)    Skin infection on face   Penicillins Other (See  Comments)    Shaky and Dizziness 20 years ago     REVIEW OF SYSTEMS:   [X]  denotes positive finding, [ ]  denotes negative finding Cardiac  Comments:  Chest pain or chest pressure:    Shortness of breath upon exertion:    Short of breath when lying flat:    Irregular heart rhythm:        Vascular    Pain in calf, thigh, or hip brought on by ambulation:    Pain in feet at night that wakes you up from your sleep:     Blood clot in your veins:    Leg swelling:         Pulmonary    Oxygen at home:    Productive cough:     Wheezing:         Neurologic    Sudden weakness in arms or legs:     Sudden numbness in arms or legs:     Sudden onset of difficulty speaking or slurred speech:    Temporary loss of vision in one eye:     Problems with dizziness:         Gastrointestinal    Blood in stool:     Vomited blood:         Genitourinary    Burning when urinating:     Blood in urine:        Psychiatric    Major depression:         Hematologic    Bleeding problems:    Problems with blood clotting too easily:        Skin    Rashes or ulcers:        Constitutional    Fever or chills:      PHYSICAL EXAMINATION:  Vitals:   01/26/21 0834  BP: 133/67  Pulse: 67  Temp: (!) 97.5 F (36.4 C)  Weight: 125 lb 11.2 oz (57 kg)  Height: 5\' 6"  (1.676 m)    General:  WDWN in NAD; vital signs documented above Gait: Not observed HENT: WNL, normocephalic Pulmonary: normal non-labored breathing , without Rales, rhonchi,  wheezing Cardiac: regular HR Abdomen: soft, NT, no masses Skin: without rashes Extremities: without ischemic changes, without Gangrene , without cellulitis; without open wounds;  Musculoskeletal: no muscle wasting or atrophy  Neurologic: A&O X 3;  No focal weakness or paresthesias are detected Psychiatric:  The pt has Normal affect.   Non-Invasive Vascular Imaging:   Bilateral ICA 1 to 39% stenosis    ASSESSMENT/PLAN:: 66 y.o. female here for  follow up for bilateral carotid bruits  -Carotid duplex demonstrates 1 to 39% stenosis bilaterally; this can be repeated in 2 years -Patient will return as scheduled in about 4 months for repeat surveillance  of axillary to bifemoral bypass graft   Dagoberto Ligas, PA-C [facility] [phone]  Clinic MD:   Carlis Abbott

## 2021-01-31 DIAGNOSIS — I10 Essential (primary) hypertension: Secondary | ICD-10-CM | POA: Diagnosis not present

## 2021-02-02 DIAGNOSIS — Z4689 Encounter for fitting and adjustment of other specified devices: Secondary | ICD-10-CM | POA: Diagnosis not present

## 2021-02-03 DIAGNOSIS — E785 Hyperlipidemia, unspecified: Secondary | ICD-10-CM | POA: Diagnosis not present

## 2021-02-03 DIAGNOSIS — I739 Peripheral vascular disease, unspecified: Secondary | ICD-10-CM | POA: Diagnosis not present

## 2021-02-03 DIAGNOSIS — I10 Essential (primary) hypertension: Secondary | ICD-10-CM | POA: Diagnosis not present

## 2021-02-13 ENCOUNTER — Other Ambulatory Visit: Payer: Self-pay | Admitting: Cardiology

## 2021-03-02 ENCOUNTER — Other Ambulatory Visit: Payer: Self-pay | Admitting: *Deleted

## 2021-03-02 DIAGNOSIS — I10 Essential (primary) hypertension: Secondary | ICD-10-CM | POA: Diagnosis not present

## 2021-03-02 NOTE — Patient Outreach (Signed)
[facility]) Care Management Geriatric Nurse Practitioner Note   03/02/2021 Name:  Pepper Kerrick MRN:  [id number] DOB:  07-Dec-1955  Summary: This telephone encounter was conducted in Spanish Pt HTN and ASVD is stable. Pt has new medications see med list.  Recommendations/Changes made from today's visit: Continue medications, follow prescribed diet, get regular exercise  Subjective: Topacio Cella is an 66 y.o. year old female who is a primary patient of [name], MD. The care management team was consulted for assistance with care management and/or care coordination needs.    Geriatric Nurse Practitioner completed Telephone Visit today.   Objective: High reading was 150/79, usual readings are in 130s/70-80s.  Allergies as of 03/02/2021       Reactions   Amoxicillin Other (See Comments)   Skin infection on face   Penicillins Other (See Comments)   Shaky and Dizziness 20 years ago        Medication List        Accurate as of March 02, 2021 10:46 AM. If you have any questions, ask your nurse or doctor.          STOP taking these medications    conjugated estrogens 0.625 MG/GM vaginal cream Commonly known as: PREMARIN       TAKE these medications    alendronate 70 MG/75ML solution Commonly known as: FOSAMAX Take 70 mg by mouth once a week.   aspirin 81 MG EC tablet Take 81 mg by mouth daily.   atorvastatin 80 MG tablet Commonly known as: Lipitor Take 1 tablet (80 mg total) by mouth daily. MUST MAKE APPT FOR FURTHER REFILLS   hydrALAZINE 50 MG tablet Commonly known as: APRESOLINE Take 1 tablet (50 mg total) by mouth 3 (three) times daily.   hydrochlorothiazide 25 MG tablet Commonly known as: HYDRODIURIL Take 25 mg by mouth daily.   Tiadylt ER 180 MG 24 hr capsule Generic drug: diltiazem TAKE 1 CAPSULE BY MOUTH EVERY DAY       SDOH:  (Social Determinants of Health) assessments and interventions performed:   Care  Plan  Review of patient past medical history, allergies, medications, health status, including review of consultants reports, laboratory and other test data, was performed as part of comprehensive evaluation for care management services.   Care Plan : RN Care Manager Plan of Care  Updates made by Deloria Lair, NP since 03/02/2021 12:00 AM     Problem: Health Literacy (Wellness)      Long-Range Goal: Health Literacy Improved by pt verbalization of receiving infor in Spanish and understanding medical instructions for chronic disease managment (HTN & CAD).   Start Date: 06/29/2020  Expected End Date: 04/02/2021  Priority: High  Note:    Update 03/02/21:  (Status: Goal on Track (progressing): YES.) Long Term Goal  Evaluation of current treatment plan related to HTN & CAD and patient's adherence to plan as established by provider Provided education to patient re: Recommended diet: DASH, Low fat. Mrs. Sarinana reports she only uses a little salt and very little grease. We discussed reason for this recommendations.This is something she can control to improve her HTN and progression of CAD/ASVD. Reviewed medications with patient and discussed HTN control (high reading was 150/79 this week. BP is usually running in 130s/70-80s Pt has seen Dr. Nyra Capes and had carotid dopplers bilat. Which showed 1-39% stenosis. She will f/u in 6 months.  Current Barriers:  Language Barrier  RNCM Clinical Goal(s):  Patient will verbalize basic understanding of  CAD  and HTN disease process and self health management plan as evidenced by reporting home monitoring results, verifying low salt. Heart healthy diet. take all medications exactly as prescribed and will call provider for medication related questions as evidenced by incoming calls from pt to NP or MD office in chart. attend all scheduled medical appointments:   as evidenced by pt report and chart review. demonstrate Ongoing health management independence as  evidenced by continued BP monitoring  through collaboration with RN Care manager, provider, and care team.   Interventions: Inter-disciplinary care team collaboration (see longitudinal plan of care) Evaluation of current treatment plan related to  self management and patient's adherence to plan as established by provider  Multiple Co-Morbidities: HTN, CAD  Patient Goals/Self-Care Activities: Pt to work on doing these tasks daily or whenever ordered over the next 3 months as evidenced by pt report and chart review. Take all medications as prescribed Attend all scheduled provider appointments Call pharmacy for medication refills 3-7 days in advance of running out of medications Call provider office for new concerns or questions  check blood pressure daily write blood pressure results in a log or diary take blood pressure log to all doctor appointments call doctor for signs and symptoms of high blood pressure keep all doctor appointments take medications for blood pressure exactly as prescribed begin an exercise program report new symptoms to your doctor eat more whole grains, fruits and vegetables, lean meats and healthy fats limit salt intake to 2000 mg/day  Follow Up Plan:  We agreed to start quarterly update calls, f/u in February.  [name]. [name], MSN, GNP-BC Gerontological Nurse Practitioner [facility] Care Management [phone]         Plan: Telephone follow up appointment with care management team member scheduled for:  06/16/21 This will be our case closure discussion if pt condition remains stable. She agrees to the current plan of care.  [name]. [name], MSN, [facility] Gerontological Nurse Practitioner [facility] Care Management [phone]

## 2021-03-03 DIAGNOSIS — I10 Essential (primary) hypertension: Secondary | ICD-10-CM | POA: Diagnosis not present

## 2021-03-03 DIAGNOSIS — E785 Hyperlipidemia, unspecified: Secondary | ICD-10-CM | POA: Diagnosis not present

## 2021-03-03 DIAGNOSIS — M810 Age-related osteoporosis without current pathological fracture: Secondary | ICD-10-CM | POA: Diagnosis not present

## 2021-03-10 ENCOUNTER — Ambulatory Visit: Payer: Medicare HMO | Admitting: Cardiology

## 2021-03-12 ENCOUNTER — Ambulatory Visit: Payer: Medicare HMO | Admitting: Cardiology

## 2021-03-19 ENCOUNTER — Other Ambulatory Visit (HOSPITAL_COMMUNITY): Payer: Self-pay | Admitting: Interventional Radiology

## 2021-03-19 DIAGNOSIS — I671 Cerebral aneurysm, nonruptured: Secondary | ICD-10-CM

## 2021-03-23 ENCOUNTER — Encounter: Payer: Self-pay | Admitting: Cardiology

## 2021-03-23 ENCOUNTER — Other Ambulatory Visit: Payer: Self-pay

## 2021-03-23 ENCOUNTER — Ambulatory Visit (INDEPENDENT_AMBULATORY_CARE_PROVIDER_SITE_OTHER): Payer: Medicare HMO | Admitting: Cardiology

## 2021-03-23 VITALS — BP 110/78 | HR 54 | Ht 66.0 in | Wt 125.5 lb

## 2021-03-23 DIAGNOSIS — M79605 Pain in left leg: Secondary | ICD-10-CM | POA: Diagnosis not present

## 2021-03-23 DIAGNOSIS — I2510 Atherosclerotic heart disease of native coronary artery without angina pectoris: Secondary | ICD-10-CM

## 2021-03-23 DIAGNOSIS — I739 Peripheral vascular disease, unspecified: Secondary | ICD-10-CM

## 2021-03-23 DIAGNOSIS — M79604 Pain in right leg: Secondary | ICD-10-CM

## 2021-03-23 DIAGNOSIS — E782 Mixed hyperlipidemia: Secondary | ICD-10-CM | POA: Diagnosis not present

## 2021-03-23 DIAGNOSIS — Z79899 Other long term (current) drug therapy: Secondary | ICD-10-CM

## 2021-03-23 DIAGNOSIS — I10 Essential (primary) hypertension: Secondary | ICD-10-CM | POA: Diagnosis not present

## 2021-03-23 LAB — CBC WITH DIFFERENTIAL/PLATELET
Basophils Absolute: 0 10*3/uL (ref 0.0–0.2)
Basos: 0 %
EOS (ABSOLUTE): 0.3 10*3/uL (ref 0.0–0.4)
Eos: 5 %
Hematocrit: 42.1 % (ref 34.0–46.6)
Hemoglobin: 14.2 g/dL (ref 11.1–15.9)
Immature Grans (Abs): 0 10*3/uL (ref 0.0–0.1)
Immature Granulocytes: 0 %
Lymphocytes Absolute: 1.9 10*3/uL (ref 0.7–3.1)
Lymphs: 34 %
MCH: 31.4 pg (ref 26.6–33.0)
MCHC: 33.7 g/dL (ref 31.5–35.7)
MCV: 93 fL (ref 79–97)
Monocytes Absolute: 0.6 10*3/uL (ref 0.1–0.9)
Monocytes: 11 %
Neutrophils Absolute: 2.7 10*3/uL (ref 1.4–7.0)
Neutrophils: 50 %
Platelets: 135 10*3/uL — ABNORMAL LOW (ref 150–450)
RBC: 4.52 x10E6/uL (ref 3.77–5.28)
RDW: 12 % (ref 11.7–15.4)
WBC: 5.5 10*3/uL (ref 3.4–10.8)

## 2021-03-23 MED ORDER — CILOSTAZOL 50 MG PO TABS: 50.0000 mg | ORAL_TABLET | Freq: Two times a day (BID) | ORAL | 3 refills | Status: DC

## 2021-03-23 NOTE — Progress Notes (Signed)
?Cardiology Office Note:   ? ?Date:  03/24/2021  ? ?ID:  Katherine Archer, DOB November 23, 1955, MRN [id number] ? ?PCP:  [name], MD  ?Cardiologist:  Berniece Salines, DO  ?Electrophysiologist:  None  ? ?Referring MD: [name], MD  ? ?" I am having leg pain" ? ?History of Present Illness:   ? ?Katherine Archer is a 66 y.o. female with a hx of  coronary artery disease recently seen on coronary CTA, carotid disease, coronary disease recently seen on a coronary CTA, hypertension, hyperlipidemia, history of stroke comes today for follow-up visit. ?  ?When I last saw the patient she was preparing for peripheral artery disease bypass grafting surgery.  She did get the surgery on November 11, 2019.  She is here today for follow-up visit. ?  ?I saw the patient on January 14, 2018 at that time her blood pressure was elevated we had amlodipine 5 mg to her medication regimen. ?  ?I did see the patient on February 2022 at that time she told me that since starting amlodipine she had significant palpitations.  During that visit what we did we will stop the amlodipine and started the patient on Cardizem 120 mg daily.  She is here today for follow-up visit.  She tells me that the palpitation has resolved. ? ?I saw the patient April 08, 2020 at that time she was hypertensive I increased her hydralazine to 50 mg 3 times a day.  I requested a follow-up.  Unfortunately, she did not present for follow-up.  She is here today for a visit ? ?She is here with her daughter and she does have an interpreter as well. Katherine Archer #[id number]).  She reports that she has been having some leg pain.  She has not had any chest pain or shortness of breath.  Mostly when walking she does not experience his leg pain.  ? ? ? ?Past Medical History:  ?Diagnosis Date  ? Cervical polyp 09/18/2018  ? Critical lower limb ischemia (HCC)   ? Encounter for screening mammogram for breast cancer 05/05/2016  ? Fibrocystic breast changes of both breasts 04/29/2015  ? Hyperlipidemia   ?  Hypertension   ? PAD (peripheral artery disease) ([place]) 08/23/2019  ? Stroke [facility] 08/23/2019  ? ? ?Past Surgical History:  ?Procedure Laterality Date  ? ABDOMINAL AORTOGRAM W/LOWER EXTREMITY Bilateral 08/23/2019  ? Procedure: ABDOMINAL AORTOGRAM W/ Bilateral LOWER EXTREMITY Runoff;  Surgeon: [name], MD;  Location: [place] [facility];  Service: Cardiovascular;  Laterality: Bilateral;  ? AXILLARY-FEMORAL BYPASS GRAFT Right 11/11/2019  ? Procedure: BYPASS GRAFT RIGHT AXILLA-BIFEMORAL;  Surgeon: [name], MD;  Location: [place];  Service: Vascular;  Laterality: Right;  ? BRAIN SURGERY    ? BREAST SURGERY Bilateral 2014  ? 1980 R breast surgery  ? CEREBRAL ANEURYSM REPAIR    ? ENDARTERECTOMY FEMORAL Bilateral 11/11/2019  ? Procedure: ENDARTERECTOMY FEMORAL BILATERALLY;  Surgeon: [name], MD;  Location: [facility];  Service: Vascular;  Laterality: Bilateral;  ? IR RADIOLOGIST EVAL & MGMT  04/02/2020  ? TUBAL LIGATION  1990  ? ? ?Current Medications: ?Current Meds  ?Medication Sig  ? alendronate (FOSAMAX) 70 MG/75ML solution Take 70 mg by mouth once a week.  ? aspirin 81 MG EC tablet Take 81 mg by mouth daily.  ? atorvastatin (LIPITOR) 80 MG tablet Take 1 tablet (80 mg total) by mouth daily. MUST MAKE APPT FOR FURTHER REFILLS  ? cilostazol (PLETAL) 50 MG tablet Take 1 tablet (50  mg total) by mouth 2 (two) times daily.  ? diltiazem (TIADYLT ER) 180 MG 24 hr capsule TAKE 1 CAPSULE BY MOUTH EVERY DAY  ? hydrochlorothiazide (HYDRODIURIL) 25 MG tablet Take 25 mg by mouth daily.  ?  ? ?Allergies:   Amoxicillin and Penicillins  ? ?Social History  ? ?Socioeconomic History  ? Marital status: Divorced  ?  Spouse name: Not on file  ? Number of children: Not on file  ? Years of education: Not on file  ? Highest education level: Not on file  ?Occupational History  ? Not on file  ?Tobacco Use  ? Smoking status: Former  ?  Packs/day: 0.25  ?  Types: Cigarettes  ?  Quit date: 10/04/2019  ?  Years since quitting: 1.4  ?  Smokeless tobacco: Never  ?Vaping Use  ? Vaping Use: Never used  ?Substance and Sexual Activity  ? Alcohol use: No  ? Drug use: No  ? Sexual activity: Not Currently  ?Other Topics Concern  ? Not on file  ?Social History Narrative  ? Not on file  ? ?Social Determinants of Health  ? ?Financial Resource Strain: Not on file  ?Food Insecurity: No Food Insecurity  ? Worried About Charity fundraiser in the Last Year: Never true  ? Ran Out of Food in the Last Year: Never true  ?Transportation Needs: No Transportation Needs  ? Lack of Transportation (Medical): No  ? Lack of Transportation (Non-Medical): No  ?Physical Activity: Not on file  ?Stress: Not on file  ?Social Connections: Not on file  ?  ? ?Family History: ?The patient's family history includes Heart Problems in her brother, brother, and father. ? ?ROS:   ?Review of Systems  ?Constitution: Negative for decreased appetite, fever and weight gain.  ?HENT: Negative for congestion, ear discharge, hoarse voice and sore throat.   ?Eyes: Negative for discharge, redness, vision loss in right eye and visual halos.  ?Cardiovascular: Negative for chest pain, dyspnea on exertion, leg swelling, orthopnea and palpitations.  ?Respiratory: Negative for cough, hemoptysis, shortness of breath and snoring.   ?Endocrine: Negative for heat intolerance and polyphagia.  ?Hematologic/Lymphatic: Negative for bleeding problem. Does not bruise/bleed easily.  ?Skin: Negative for flushing, nail changes, rash and suspicious lesions.  ?Musculoskeletal: Negative for arthritis, joint pain, muscle cramps, myalgias, neck pain and stiffness.  ?Gastrointestinal: Negative for abdominal pain, bowel incontinence, diarrhea and excessive appetite.  ?Genitourinary: Negative for decreased libido, genital sores and incomplete emptying.  ?Neurological: Negative for brief paralysis, focal weakness, headaches and loss of balance.  ?Psychiatric/Behavioral: Negative for altered mental status, depression and  suicidal ideas.  ?Allergic/Immunologic: Negative for HIV exposure and persistent infections.  ? ? ?EKGs/Labs/Other Studies Reviewed:   ? ?The following studies were reviewed today: ? ? ?EKG:  The ekg ordered today demonstrates sinus bradycardia, heart rate 54 bpm, compared to EKG done in 2021 no significant change. ? ?Echocardiogram November 2021 IMPRESSIONS  ? ? 1. Left ventricular ejection fraction, by estimation, is 60 to 65%. The left ventricle has normal function. The left ventricle has no regional wall motion abnormalities. There is mild concentric left ventricular hypertrophy. Left ventricular diastolic parameters are consistent with Grade II diastolic dysfunction  ?(pseudonormalization). Elevated left atrial pressure.  ? 2. Right ventricular systolic function is normal. The right ventricular  ?size is normal. There is mildly elevated pulmonary artery systolic  ?pressure. The estimated right ventricular systolic pressure is 47.8 mmHg.  ? 3. The mitral valve is normal in structure. No  evidence of mitral valve  ?regurgitation. No evidence of mitral stenosis.  ? 4. The aortic valve is normal in structure. Aortic valve regurgitation is  ?not visualized. No aortic stenosis is present.  ? 5. The inferior vena cava is dilated in size with <50% respiratory  ?variability, suggesting right atrial pressure of 15 mmHg.  ? ?FINDINGS  ? Left Ventricle: Left ventricular ejection fraction, by estimation, is 60  ?to 65%. The left ventricle has normal function. The left ventricle has no  ?regional wall motion abnormalities. The left ventricular internal cavity  ?size was normal in size. There is  ? mild concentric left ventricular hypertrophy. Left ventricular diastolic  ?parameters are consistent with Grade II diastolic dysfunction  ?(pseudonormalization). Elevated left atrial pressure.  ? ?Right Ventricle: The right ventricular size is normal. No increase in  ?right ventricular wall thickness. Right ventricular systolic  function is  ?normal. There is mildly elevated pulmonary artery systolic pressure. The  ?tricuspid regurgitant velocity is 2.31  ? m/s, and with an assumed right atrial pressure of 15 mmHg, the estimated  ?right

## 2021-03-23 NOTE — Patient Instructions (Addendum)
Medication Instructions:  ?Your physician has recommended you make the following change in your medication:  ?START: Cilostazol 50 mg twice daily ?*If you need a refill on your cardiac medications before your next appointment, please call your pharmacy* ? ? ?Lab Work: ?Your physician recommends that you return for lab work in:  ?TODAY: CBC ?If you have labs (blood work) drawn today and your tests are completely normal, you will receive your results only by: ?MyChart Message (if you have MyChart) OR ?A paper copy in the mail ?If you have any lab test that is abnormal or we need to change your treatment, we will call you to review the results. ? ? ?Testing/Procedures: ?None ? ? ?Follow-Up: ?At [facility], you and your health needs are our priority.  As part of our continuing mission to provide you with exceptional heart care, we have created designated Provider Care Teams.  These Care Teams include your primary Cardiologist (physician) and Advanced Practice Providers (APPs -  Physician Assistants and Nurse Practitioners) who all work together to provide you with the care you need, when you need it. ? ?We recommend signing up for the patient portal called "MyChart".  Sign up information is provided on this After Visit Summary.  MyChart is used to connect with patients for Virtual Visits (Telemedicine).  Patients are able to view lab/test results, encounter notes, upcoming appointments, etc.  Non-urgent messages can be sent to your provider as well.   ?To learn more about what you can do with MyChart, go to [email or web address].   ? ?Your next appointment:   ?4 month(s) ? ?The format for your next appointment:   ?In Person ? ?Provider:   ?Berniece Salines, DO   ? ? ?Other Instructions ?  ?

## 2021-03-24 ENCOUNTER — Ambulatory Visit (HOSPITAL_COMMUNITY)
Admission: RE | Admit: 2021-03-24 | Discharge: 2021-03-24 | Disposition: A | Discharge status: Home / Self Care | Payer: Medicare HMO | Source: Ambulatory Visit | Attending: Interventional Radiology | Admitting: Interventional Radiology

## 2021-03-24 DIAGNOSIS — I671 Cerebral aneurysm, nonruptured: Secondary | ICD-10-CM | POA: Diagnosis not present

## 2021-03-25 ENCOUNTER — Telehealth (HOSPITAL_COMMUNITY): Payer: Self-pay | Admitting: Radiology

## 2021-03-25 NOTE — Telephone Encounter (Signed)
Patient's daughter, [name], called and states that pt saw Dr. Estanislado Pandy in consult for treatment of her brain aneurysm on 03/24/21. Per Dr. Estanislado Pandy pt will need cardiac and vein and vascular clearance prior to  her procedure. I sent an in box message to Dr. Harriet Masson and to [facility] for those clearances on 03/25/21. Will call pt's daughter back once we receive clearance from both providers to schedule treatment. JM ?

## 2021-03-29 HISTORY — PX: IR RADIOLOGIST EVAL & MGMT: IMG5224

## 2021-03-31 DIAGNOSIS — Z4689 Encounter for fitting and adjustment of other specified devices: Secondary | ICD-10-CM | POA: Diagnosis not present

## 2021-04-03 DIAGNOSIS — I739 Peripheral vascular disease, unspecified: Secondary | ICD-10-CM | POA: Diagnosis not present

## 2021-04-03 DIAGNOSIS — I69354 Hemiplegia and hemiparesis following cerebral infarction affecting left non-dominant side: Secondary | ICD-10-CM | POA: Diagnosis not present

## 2021-05-03 DIAGNOSIS — I739 Peripheral vascular disease, unspecified: Secondary | ICD-10-CM | POA: Diagnosis not present

## 2021-05-03 DIAGNOSIS — I69354 Hemiplegia and hemiparesis following cerebral infarction affecting left non-dominant side: Secondary | ICD-10-CM | POA: Diagnosis not present

## 2021-05-11 ENCOUNTER — Encounter (HOSPITAL_COMMUNITY): Payer: Medicare HMO

## 2021-05-11 ENCOUNTER — Ambulatory Visit: Payer: Medicare HMO

## 2021-05-12 ENCOUNTER — Telehealth: Payer: Self-pay | Admitting: *Deleted

## 2021-05-12 DIAGNOSIS — E785 Hyperlipidemia, unspecified: Secondary | ICD-10-CM | POA: Diagnosis not present

## 2021-05-12 NOTE — Telephone Encounter (Signed)
? ?  Name: Katherine Archer  ?DOB: 20-Oct-1955  ?MRN: [id number]  ? ?Primary Cardiologist: Berniece Salines, DO ? ?Chart reviewed as part of pre-operative protocol coverage. Patient was contacted 05/12/2021 in reference to pre-operative risk assessment for pending surgery as outlined below.  Katherine Archer was last seen on 03/23/2021 by Dr. Harriet Masson.  Since that day, Katherine Archer has done well without exertional chest pain or shortness of breath.  Patient had minimal plaque on previous coronary CT in 2021. ? ?Therefore, based on ACC/AHA guidelines, the patient would be at acceptable risk for the planned procedure without further cardiovascular testing.  ? ?She may hold Pletal for 3 to 5 days prior to the procedure and restart afterward.  If needed, she may hold aspirin for 7 days prior to the procedure and restart as soon as possible afterward at the surgeon's discretion. ? ?The patient was advised that if she develops new symptoms prior to surgery to contact our office to arrange for a follow-up visit, and she verbalized understanding. ? ?I will route this recommendation to the requesting party via Epic fax function and remove from pre-op pool. Please call with questions. ? ?Katherine Archer, [place] ?05/12/2021, 4:51 PM ? ?

## 2021-05-12 NOTE — Telephone Encounter (Signed)
? ?  Pre-operative Risk Assessment  ?  ?Patient Name: Katherine Archer  ?DOB: 02/04/1955 ?MRN: [id number]  ? ?  ? ?Request for Surgical Clearance   ? ?Procedure:   BRAIN ANEURYSM EMBOLIZATION  ? ?Date of Surgery:  Clearance TBD                              ? ?Surgeon:  DR. SANJEEV DEVESHWAR ?Surgeon's Group or Practice Name:  [facility] ?Phone number:  [phone] ATTN: Miles Costain, [place], [place] ?Fax number:  [phone] ?  ?Type of Clearance Requested:   ?- Medical  ?  ?Type of Anesthesia:  General  ?  ?Additional requests/questions:   ? ?Signed, ?Julaine Hua   ?05/12/2021, 3:35 PM  ? ?

## 2021-05-18 ENCOUNTER — Encounter (HOSPITAL_COMMUNITY): Payer: Medicare HMO

## 2021-05-18 ENCOUNTER — Ambulatory Visit: Payer: Medicare HMO

## 2021-05-21 DIAGNOSIS — Z6820 Body mass index (BMI) 20.0-20.9, adult: Secondary | ICD-10-CM | POA: Diagnosis not present

## 2021-05-21 DIAGNOSIS — E785 Hyperlipidemia, unspecified: Secondary | ICD-10-CM | POA: Diagnosis not present

## 2021-05-21 DIAGNOSIS — I739 Peripheral vascular disease, unspecified: Secondary | ICD-10-CM | POA: Diagnosis not present

## 2021-05-21 DIAGNOSIS — I10 Essential (primary) hypertension: Secondary | ICD-10-CM | POA: Diagnosis not present

## 2021-05-25 ENCOUNTER — Other Ambulatory Visit: Payer: Self-pay | Admitting: Cardiology

## 2021-06-01 DIAGNOSIS — Z4689 Encounter for fitting and adjustment of other specified devices: Secondary | ICD-10-CM | POA: Diagnosis not present

## 2021-06-01 DIAGNOSIS — N819 Female genital prolapse, unspecified: Secondary | ICD-10-CM | POA: Diagnosis not present

## 2021-06-02 DIAGNOSIS — I10 Essential (primary) hypertension: Secondary | ICD-10-CM | POA: Diagnosis not present

## 2021-06-03 DIAGNOSIS — I739 Peripheral vascular disease, unspecified: Secondary | ICD-10-CM | POA: Diagnosis not present

## 2021-06-03 DIAGNOSIS — I69354 Hemiplegia and hemiparesis following cerebral infarction affecting left non-dominant side: Secondary | ICD-10-CM | POA: Diagnosis not present

## 2021-06-07 DIAGNOSIS — Z1231 Encounter for screening mammogram for malignant neoplasm of breast: Secondary | ICD-10-CM | POA: Diagnosis not present

## 2021-06-15 NOTE — Progress Notes (Deleted)
HISTORY AND PHYSICAL     CC:  follow up. Requesting Provider:  [name], MD  HPI: This is a 66 y.o. female who is here today for follow up for PAD.  Pt has hx of right axillary bifemoral bypass grafting with bilateral common femoral artery endarterectomies for rest pain in both feet 11/11/2019 by Dr. Oneida Alar.   Pt was last seen January 2023 and at that time, she was not having any rest pain, claudication or non healing wounds.  She had recently stubbed her toe but was not having any trouble with this.  She did have some soreness over the ax fem graft, but not painful or sore to palpation.  Her ABI's had increased from previous visit.  Plan for return to clinic in 4 months prior to [place] leaving for the [place] in [place].  She did have carotid bruits and duplex obtained and this was 1-39% bilateral ICA stenosis.    The pt returns today for follow up.  ***  The pt is on a statin for cholesterol management.    The pt is on an aspirin.    Other AC:  none The pt is on hydralazine, diuretic for hypertension.  The pt does not have diabetes. Tobacco hx:  former  Pt does *** have family hx of AAA.  Past Medical History:  Diagnosis Date   Cervical polyp 09/18/2018   Critical lower limb ischemia (HCC)    Encounter for screening mammogram for breast cancer 05/05/2016   Fibrocystic breast changes of both breasts 04/29/2015   Hyperlipidemia    Hypertension    PAD (peripheral artery disease) ([place]) 08/23/2019   Stroke ([place]) 08/23/2019    Past Surgical History:  Procedure Laterality Date   ABDOMINAL AORTOGRAM W/LOWER EXTREMITY Bilateral 08/23/2019   Procedure: ABDOMINAL AORTOGRAM W/ Bilateral LOWER EXTREMITY Runoff;  Surgeon: [name], MD;  Location: [place] [facility];  Service: Cardiovascular;  Laterality: Bilateral;   AXILLARY-FEMORAL BYPASS GRAFT Right 11/11/2019   Procedure: BYPASS GRAFT RIGHT AXILLA-BIFEMORAL;  Surgeon: [name], MD;  Location: [facility];  Service:  Vascular;  Laterality: Right;   BRAIN SURGERY     BREAST SURGERY Bilateral 2014   1980 R breast surgery   CEREBRAL ANEURYSM REPAIR     ENDARTERECTOMY FEMORAL Bilateral 11/11/2019   Procedure: ENDARTERECTOMY FEMORAL BILATERALLY;  Surgeon: [name], MD;  Location: [facility];  Service: Vascular;  Laterality: Bilateral;   IR RADIOLOGIST EVAL & MGMT  04/02/2020   IR RADIOLOGIST EVAL & MGMT  03/29/2021   TUBAL LIGATION  1990    Allergies  Allergen Reactions   Amoxicillin Other (See Comments)    Skin infection on face   Penicillins Other (See Comments)    Shaky and Dizziness 20 years ago    Current Outpatient Medications  Medication Sig Dispense Refill   alendronate (FOSAMAX) 70 MG/75ML solution Take 70 mg by mouth once a week.     aspirin 81 MG EC tablet Take 81 mg by mouth daily.     atorvastatin (LIPITOR) 80 MG tablet Take 1 tablet (80 mg total) by mouth daily. MUST MAKE APPT FOR FURTHER REFILLS 30 tablet 0   cilostazol (PLETAL) 50 MG tablet Take 1 tablet (50 mg total) by mouth 2 (two) times daily. 180 tablet 3   diltiazem (TIADYLT ER) 180 MG 24 hr capsule TAKE 1 CAPSULE BY MOUTH EVERY DAY 90 capsule 3   hydrALAZINE (APRESOLINE) 50 MG tablet Take 1 tablet (50 mg total) by mouth  3 (three) times daily. 270 tablet 3   hydrochlorothiazide (HYDRODIURIL) 25 MG tablet Take 25 mg by mouth daily.     No current facility-administered medications for this visit.    Family History  Problem Relation Age of Onset   Heart Problems Father    Heart Problems Brother    Heart Problems Brother     Social History   Socioeconomic History   Marital status: Divorced    Spouse name: Not on file   Number of children: Not on file   Years of education: Not on file   Highest education level: Not on file  Occupational History   Not on file  Tobacco Use   Smoking status: Former    Packs/day: 0.25    Types: Cigarettes    Quit date: 10/04/2019    Years since quitting: 1.6   Smokeless tobacco:  Never  Vaping Use   Vaping Use: Never used  Substance and Sexual Activity   Alcohol use: No   Drug use: No   Sexual activity: Not Currently  Other Topics Concern   Not on file  Social History Narrative   Not on file   Social Determinants of Health   Financial Resource Strain: Not on file  Food Insecurity: No Food Insecurity (06/29/2020)   Hunger Vital Sign    Worried About Running Out of Food in the Last Year: Never true    Ran Out of Food in the Last Year: Never true  Transportation Needs: No Transportation Needs (06/29/2020)   PRAPARE - Hydrologist (Medical): No    Lack of Transportation (Non-Medical): No  Physical Activity: Not on file  Stress: Not on file  Social Connections: Not on file  Intimate Partner Violence: Not on file     REVIEW OF SYSTEMS:  *** '[X]'$  denotes positive finding, '[ ]'$  denotes negative finding Cardiac  Comments:  Chest pain or chest pressure:    Shortness of breath upon exertion:    Short of breath when lying flat:    Irregular heart rhythm:        Vascular    Pain in calf, thigh, or hip brought on by ambulation:    Pain in feet at night that wakes you up from your sleep:     Blood clot in your veins:    Leg swelling:         Pulmonary    Oxygen at home:    Productive cough:     Wheezing:         Neurologic    Sudden weakness in arms or legs:     Sudden numbness in arms or legs:     Sudden onset of difficulty speaking or slurred speech:    Temporary loss of vision in one eye:     Problems with dizziness:         Gastrointestinal    Blood in stool:     Vomited blood:         Genitourinary    Burning when urinating:     Blood in urine:        Psychiatric    Major depression:         Hematologic    Bleeding problems:    Problems with blood clotting too easily:        Skin    Rashes or ulcers:        Constitutional    Fever or chills:      PHYSICAL  EXAMINATION:  ***  General:  WDWN in NAD;  vital signs documented above Gait: Not observed HENT: WNL, normocephalic Pulmonary: normal non-labored breathing , without wheezing Cardiac: {Desc; regular/irreg:14544} HR, {With/Without:20273} carotid bruit*** Abdomen: soft, NT, no masses; aortic pulse is *** palpable Skin: {With/Without:20273} rashes Vascular Exam/Pulses:  Right Left  Radial {Exam; arterial pulse strength 0-4:30167} {Exam; arterial pulse strength 0-4:30167}  Femoral {Exam; arterial pulse strength 0-4:30167} {Exam; arterial pulse strength 0-4:30167}  Popliteal {Exam; arterial pulse strength 0-4:30167} {Exam; arterial pulse strength 0-4:30167}  DP {Exam; arterial pulse strength 0-4:30167} {Exam; arterial pulse strength 0-4:30167}  PT {Exam; arterial pulse strength 0-4:30167} {Exam; arterial pulse strength 0-4:30167}  Peroneal *** ***   Extremities: {With/Without:20273} ischemic changes, {With/Without:20273} Gangrene , {With/Without:20273} cellulitis; {With/Without:20273} open wounds Musculoskeletal: no muscle wasting or atrophy  Neurologic: A&O X 3 Psychiatric:  The pt has {Desc; normal/abnormal:11317::"Normal"} affect.   Non-Invasive Vascular Imaging:   ABI's/TBI's on 06/17/2021: Right:  *** - Great toe pressure: *** Left:  *** - Great toe pressure: ***  Arterial duplex on 06/17/2021: ***  Previous ABI's/TBI's on 01/21/2021: Right:  0.50/0 - Great toe pressure: 0 Left:  0.69/0.31 - Great toe pressure:  42  Previous arterial duplex on 01/21/2021: Fem Fem Graft: Right to Left  +------------------+--------+--------+----------+--------+                    PSV cm/sStenosisWaveform  Comments  +------------------+--------+--------+----------+--------+  Inflow            71              biphasic            +------------------+--------+--------+----------+--------+  Prox anastomosis  74              monophasic          +------------------+--------+--------+----------+--------+  Proximal graft    35               biphasic            +------------------+--------+--------+----------+--------+  Mid graft         38              monophasic          +------------------+--------+--------+----------+--------+  Distal graft      64              biphasic            +------------------+--------+--------+----------+--------+  Distal anastomosis50              triphasic           +------------------+--------+--------+----------+--------+  Outflow           113             biphasic            +------------------+--------+--------+----------+--------+   Right Graft #1: Axillary to bifemoral  +------------------+--------+--------+---------+--------+                    PSV cm/sStenosisWaveform Comments  +------------------+--------+--------+---------+--------+  Inflow            193             triphasic          +------------------+--------+--------+---------+--------+  Prox Anastomosis  181             triphasic          +------------------+--------+--------+---------+--------+  Proximal Graft    83  biphasic           +------------------+--------+--------+---------+--------+  Mid Graft         65              triphasic          +------------------+--------+--------+---------+--------+  Distal Graft      77              biphasic           +------------------+--------+--------+---------+--------+  Distal Anastomosis191             biphasic           +------------------+--------+--------+---------+--------+  Outflow           113             biphasic           +------------------+--------+--------+---------+--------+   Summary:  Right: Patent right ax - bifem bypass graft with no evidence of restenosis  Carotid duplex 01/26/2021: 1-39% bilateral ICA stenosis    ASSESSMENT/PLAN:: 66 y.o. female here for follow up for PAD with hx of ight axillary bifemoral bypass grafting with bilateral common femoral artery  endarterectomies for rest pain in both feet 11/11/2019 by Dr. Oneida Alar.   -*** -continue asa/statin -pt will f/u in *** with ***.   Leontine Locket, [facility] [phone]  Clinic MD:   Scot Dock

## 2021-06-16 ENCOUNTER — Other Ambulatory Visit: Payer: Self-pay | Admitting: *Deleted

## 2021-06-16 NOTE — Patient Outreach (Signed)
[facility]) Care Management Geriatric Nurse Practitioner Note   06/16/2021 Name:  Katherine Archer MRN:  [id number] DOB:  1955/07/26  Summary: HTN and CAD stable  Recommendations/Changes made from today's visit: Case being closed today. Advised pt she can call NP if any problems or issues arise she would like assistance with.  Subjective: Katherine Archer is an 66 y.o. year old female who is a primary patient of Katherine Shivers, MD. The care management team was consulted for assistance with care management and/or care coordination needs.    Geriatric Nurse Practitioner completed Telephone Visit today.   Patient Active Problem List   Diagnosis Date Noted   Critical lower limb ischemia ([place])    Coronary artery disease involving native coronary artery of native heart without angina pectoris 10/03/2019   Essential hypertension 10/03/2019   Mixed hyperlipidemia 10/03/2019   Hyperlipidemia    Hypertension    Stroke ([place]) 08/23/2019   PAD (peripheral artery disease) ([place]) 08/23/2019   Cervical polyp 09/18/2018   Encounter for screening mammogram for breast cancer 05/05/2016   Fibrocystic breast changes of both breasts 04/29/2015   Outpatient Encounter Medications as of 06/16/2021  Medication Sig Note   alendronate (FOSAMAX) 70 MG/75ML solution Take 70 mg by mouth once a week.    aspirin 81 MG EC tablet Take 81 mg by mouth daily.    atorvastatin (LIPITOR) 80 MG tablet Take 1 tablet (80 mg total) by mouth daily. MUST MAKE APPT FOR FURTHER REFILLS    cilostazol (PLETAL) 50 MG tablet Take 1 tablet (50 mg total) by mouth 2 (two) times daily.    diltiazem (TIADYLT ER) 180 MG 24 hr capsule TAKE 1 CAPSULE BY MOUTH EVERY DAY    hydrALAZINE (APRESOLINE) 50 MG tablet Take 1 tablet (50 mg total) by mouth 3 (three) times daily. 03/02/2021: Pt is taking it but needs to call pharmacy for new Rx.   hydrochlorothiazide (HYDRODIURIL) 25 MG tablet Take 25 mg by mouth daily.    No  facility-administered encounter medications on file as of 06/16/2021.     Care Plan  Review of patient past medical history, allergies, medications, health status, including review of consultants reports, laboratory and other test data, was performed as part of comprehensive evaluation for care management services.   Care Plan : RN Care Manager Plan of Care  Updates made by Deloria Lair, NP since 06/16/2021 12:00 AM     Problem: Health Literacy (Wellness)      Long-Range Goal: Health Literacy Improved by pt verbalization of receiving infor in Spanish and understanding medical instructions for chronic disease managment (HTN & CAD).   Start Date: 06/29/2020  Expected End Date: 04/02/2021  This Visit's Progress: On track  Priority: High  Note:    Update 06/16/21:  (Status: Goal Met.) Long Term Goal  Evaluation of current treatment plan related to HTN & CAD and patient's adherence to plan as established by provider Pt reports her blood pressure is running in the 100-130/70-90s. 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 Evidence of new aneurysm distal to the site of previous clipping. Aneurysm measuring approximately 8 mm x 5 mm arising from the supraclinoid left ICA. The pt and daughter were given choices to monitor the aneurysm or have a procedure to repair it. Pt says today they are just monitoring it. She will have other vascular studies  of her previous stenting procedures this month. Pt denies questions at this time.  Update 03/02/21:  (Status: Goal on Track (progressing): YES.) Long Term Goal  Evaluation of current treatment plan related to HTN & CAD and patient's adherence to plan as established by provider Provided education to patient re: Recommended diet: DASH, Low fat. [name] reports she only uses a little salt and very  little grease. We discussed reason for this recommendations.This is something she can control to improve her HTN and progression of CAD/ASVD. Reviewed medications with patient and discussed HTN control (high reading was 150/79 this week. BP is usually running in 130s/70-80s Pt has seen Dr. Nyra Capes and had carotid dopplers bilat. Which showed 1-39% stenosis. She will f/u in 6 months.  Current Barriers:  Language Barrier  RNCM Clinical Goal(s):  Patient will verbalize basic understanding of  CAD and HTN disease process and self health management plan as evidenced by reporting home monitoring results, verifying low salt. Heart healthy diet. take all medications exactly as prescribed and will call provider for medication related questions as evidenced by incoming calls from pt to NP or MD office in chart. attend all scheduled medical appointments:   as evidenced by pt report and chart review. demonstrate Ongoing health management independence as evidenced by continued BP monitoring  through collaboration with RN Care manager, provider, and care team.   Interventions: Inter-disciplinary care team collaboration (see longitudinal plan of care) Evaluation of current treatment plan related to  self management and patient's adherence to plan as established by provider  Multiple Co-Morbidities: HTN, CAD  Patient Goals/Self-Care Activities: Pt to work on doing these tasks daily or whenever ordered over the next 3 months as evidenced by pt report and chart review. Take all medications as prescribed Attend all scheduled provider appointments Call pharmacy for medication refills 3-7 days in advance of running out of medications Call provider office for new concerns or questions  check blood pressure daily write blood pressure results in a log or diary take blood pressure log to all doctor appointments call doctor for signs and symptoms of high blood pressure keep all doctor appointments take medications  for blood pressure exactly as prescribed begin an exercise program report new symptoms to your doctor eat more whole grains, fruits and vegetables, lean meats and healthy fats limit salt intake to 2000 mg/day  Follow Up Plan:  We agreed to start quarterly update calls, f/u in February.  [name]. [name], MSN, GNP-BC Gerontological Nurse Practitioner [facility] Care Management [phone]         Plan: No further follow up required: We agreed to close her case today. Her goals have been met and there are no current needs for continued care management.  [name]. [name], MSN, [facility] Gerontological Nurse Practitioner [facility] Care Management [phone]

## 2021-06-17 ENCOUNTER — Ambulatory Visit: Payer: Medicare HMO

## 2021-06-17 ENCOUNTER — Ambulatory Visit (INDEPENDENT_AMBULATORY_CARE_PROVIDER_SITE_OTHER)
Admission: RE | Admit: 2021-06-17 | Discharge: 2021-06-17 | Disposition: A | Discharge status: Home / Self Care | Payer: Medicare HMO | Source: Ambulatory Visit | Attending: Physician Assistant | Admitting: Physician Assistant

## 2021-06-17 ENCOUNTER — Encounter (HOSPITAL_COMMUNITY): Payer: Self-pay

## 2021-06-17 ENCOUNTER — Ambulatory Visit (HOSPITAL_COMMUNITY)
Admission: RE | Admit: 2021-06-17 | Discharge: 2021-06-17 | Disposition: A | Discharge status: Home / Self Care | Payer: Medicare HMO | Source: Ambulatory Visit | Attending: Physician Assistant | Admitting: Physician Assistant

## 2021-06-17 ENCOUNTER — Telehealth: Payer: Self-pay | Admitting: Physician Assistant

## 2021-06-17 DIAGNOSIS — I70229 Atherosclerosis of native arteries of extremities with rest pain, unspecified extremity: Secondary | ICD-10-CM

## 2021-06-17 DIAGNOSIS — I739 Peripheral vascular disease, unspecified: Secondary | ICD-10-CM | POA: Diagnosis not present

## 2021-06-17 NOTE — Telephone Encounter (Signed)
Pt daughter did not want her mother to stay for PA appt. She felt they had been here too long and she had things to do.  They are going out of the country until July 10. Rescheduled until July 12.

## 2021-06-24 DIAGNOSIS — I10 Essential (primary) hypertension: Secondary | ICD-10-CM | POA: Diagnosis not present

## 2021-07-02 DIAGNOSIS — I10 Essential (primary) hypertension: Secondary | ICD-10-CM | POA: Diagnosis not present

## 2021-07-03 DIAGNOSIS — I739 Peripheral vascular disease, unspecified: Secondary | ICD-10-CM | POA: Diagnosis not present

## 2021-07-03 DIAGNOSIS — I69354 Hemiplegia and hemiparesis following cerebral infarction affecting left non-dominant side: Secondary | ICD-10-CM | POA: Diagnosis not present

## 2021-07-14 ENCOUNTER — Ambulatory Visit: Payer: Medicare HMO

## 2021-07-15 DIAGNOSIS — N6489 Other specified disorders of breast: Secondary | ICD-10-CM | POA: Diagnosis not present

## 2021-07-15 DIAGNOSIS — R928 Other abnormal and inconclusive findings on diagnostic imaging of breast: Secondary | ICD-10-CM | POA: Diagnosis not present

## 2021-07-23 ENCOUNTER — Ambulatory Visit (INDEPENDENT_AMBULATORY_CARE_PROVIDER_SITE_OTHER): Payer: Medicare HMO | Admitting: Cardiology

## 2021-07-23 VITALS — BP 128/54 | HR 67 | Ht 66.0 in | Wt 124.8 lb

## 2021-07-23 DIAGNOSIS — I10 Essential (primary) hypertension: Secondary | ICD-10-CM | POA: Diagnosis not present

## 2021-07-23 DIAGNOSIS — I739 Peripheral vascular disease, unspecified: Secondary | ICD-10-CM | POA: Diagnosis not present

## 2021-07-23 DIAGNOSIS — E782 Mixed hyperlipidemia: Secondary | ICD-10-CM | POA: Diagnosis not present

## 2021-07-23 DIAGNOSIS — I2510 Atherosclerotic heart disease of native coronary artery without angina pectoris: Secondary | ICD-10-CM

## 2021-07-23 NOTE — Patient Instructions (Signed)
Medication Instructions:  Your physician recommends that you continue on your current medications as directed. Please refer to the Current Medication list given to you today.  *If you need a refill on your cardiac medications before your next appointment, please call your pharmacy*   Lab Work: NONE If you have labs (blood work) drawn today and your tests are completely normal, you will receive your results only by: [place] (if you have MyChart) OR A paper copy in the mail If you have any lab test that is abnormal or we need to change your treatment, we will call you to review the results.   Testing/Procedures: NONE   Follow-Up: At [facility], you and your health needs are our priority.  As part of our continuing mission to provide you with exceptional heart care, we have created designated Provider Care Teams.  These Care Teams include your primary Cardiologist (physician) and Advanced Practice Providers (APPs -  Physician Assistants and Nurse Practitioners) who all work together to provide you with the care you need, when you need it.  We recommend signing up for the patient portal called "MyChart".  Sign up information is provided on this After Visit Summary.  MyChart is used to connect with patients for Virtual Visits (Telemedicine).  Patients are able to view lab/test results, encounter notes, upcoming appointments, etc.  Non-urgent messages can be sent to your provider as well.   To learn more about what you can do with MyChart, go to [email or web address].    Your next appointment:   9 month(s)  The format for your next appointment:   In Person  Provider:   Berniece Salines, DO

## 2021-07-23 NOTE — Progress Notes (Signed)
Cardiology Office Note:    Date:  07/26/2021   ID:  Katherine Archer, DOB [birth year], MRN [id number]  PCP:  Katherine Shivers, MD  Cardiologist:  Katherine Salines, DO  Electrophysiologist:  None   Referring MD: Katherine Shivers, MD   " I am doing fine"  History of Present Illness:    Katherine Archer is a 66 y.o. female with a hx of coronary artery disease recently seen on coronary CTA, carotid disease, coronary disease recently seen on a coronary CTA, hypertension, hyperlipidemia, history of stroke comes today for follow-up visit.  At her last visit she was experiencing significant leg pain.  She had stopped her cilostazol to restart this medication with her aspirin.  She tells me since the start of this medication she has been doing well.  Past Medical History:  Diagnosis Date   Cervical polyp 09/18/2018   Critical lower limb ischemia (HCC)    Encounter for screening mammogram for breast cancer 05/05/2016   Fibrocystic breast changes of both breasts 04/29/2015   Hyperlipidemia    Hypertension    PAD (peripheral artery disease) ([place]) 08/23/2019   Stroke ([place]) 08/23/2019    Past Surgical History:  Procedure Laterality Date   ABDOMINAL AORTOGRAM W/LOWER EXTREMITY Bilateral 08/23/2019   Procedure: ABDOMINAL AORTOGRAM W/ Bilateral LOWER EXTREMITY [place];  Surgeon: Katherine Dutch, MD;  Location: [facility];  Service: Cardiovascular;  Laterality: Bilateral;   AXILLARY-FEMORAL BYPASS GRAFT Right 11/11/2019   Procedure: BYPASS GRAFT RIGHT AXILLA-BIFEMORAL;  Surgeon: Katherine Dutch, MD;  Location: [facility];  Service: Vascular;  Laterality: Right;   BRAIN SURGERY     BREAST SURGERY Bilateral 2014   1980 R breast surgery   CEREBRAL ANEURYSM REPAIR     ENDARTERECTOMY FEMORAL Bilateral 11/11/2019   Procedure: ENDARTERECTOMY FEMORAL BILATERALLY;  Surgeon: Katherine Dutch, MD;  Location: [facility];  Service: Vascular;  Laterality: Bilateral;   IR RADIOLOGIST EVAL & MGMT  04/02/2020   IR RADIOLOGIST  EVAL & MGMT  03/29/2021   TUBAL LIGATION  1990    Current Medications: Current Meds  Medication Sig   alendronate (FOSAMAX) 70 MG/75ML solution Take 70 mg by mouth once a week.   aspirin 81 MG EC tablet Take 81 mg by mouth daily.   atorvastatin (LIPITOR) 80 MG tablet Take 1 tablet (80 mg total) by mouth daily. MUST MAKE APPT FOR FURTHER REFILLS   cilostazol (PLETAL) 50 MG tablet Take 1 tablet (50 mg total) by mouth 2 (two) times daily.   diltiazem (TIADYLT ER) 180 MG 24 hr capsule TAKE 1 CAPSULE BY MOUTH EVERY DAY   hydrALAZINE (APRESOLINE) 50 MG tablet Take 1 tablet (50 mg total) by mouth 3 (three) times daily.   hydrochlorothiazide (HYDRODIURIL) 25 MG tablet Take 25 mg by mouth daily.     Allergies:   Amoxicillin and Penicillins   Social History   Socioeconomic History   Marital status: Divorced    Spouse name: Not on file   Number of children: Not on file   Years of education: Not on file   Highest education level: Not on file  Occupational History   Not on file  Tobacco Use   Smoking status: Former    Packs/day: 0.25    Types: Cigarettes    Quit date: 10/04/2019    Years since quitting: 1.8   Smokeless tobacco: Never  Vaping Use   Vaping Use: Never used  Substance and Sexual Activity   Alcohol use: No   Drug use: No  Sexual activity: Not Currently  Other Topics Concern   Not on file  Social History Narrative   Not on file   Social Determinants of Health   Financial Resource Strain: Not on file  Food Insecurity: No Food Insecurity (06/29/2020)   Hunger Vital Sign    Worried About Running Out of Food in the Last Year: Never true    Ran Out of Food in the Last Year: Never true  Transportation Needs: No Transportation Needs (06/29/2020)   PRAPARE - Hydrologist (Medical): No    Lack of Transportation (Non-Medical): No  Physical Activity: Not on file  Stress: Not on file  Social Connections: Not on file     Family History: The  patient's family history includes Heart Problems in her brother, brother, and father.  ROS:   Review of Systems  Constitution: Negative for decreased appetite, fever and weight gain.  HENT: Negative for congestion, ear discharge, hoarse voice and sore throat.   Eyes: Negative for discharge, redness, vision loss in right eye and visual halos.  Cardiovascular: Negative for chest pain, dyspnea on exertion, leg swelling, orthopnea and palpitations.  Respiratory: Negative for cough, hemoptysis, shortness of breath and snoring.   Endocrine: Negative for heat intolerance and polyphagia.  Hematologic/Lymphatic: Negative for bleeding problem. Does not bruise/bleed easily.  Skin: Negative for flushing, nail changes, rash and suspicious lesions.  Musculoskeletal: Negative for arthritis, joint pain, muscle cramps, myalgias, neck pain and stiffness.  Gastrointestinal: Negative for abdominal pain, bowel incontinence, diarrhea and excessive appetite.  Genitourinary: Negative for decreased libido, genital sores and incomplete emptying.  Neurological: Negative for brief paralysis, focal weakness, headaches and loss of balance.  Psychiatric/Behavioral: Negative for altered mental status, depression and suicidal ideas.  Allergic/Immunologic: Negative for HIV exposure and persistent infections.    EKGs/Labs/Other Studies Reviewed:    The following studies were reviewed today:   EKG:  None today  Echocardiogram November 2021 IMPRESSIONS    1. Left ventricular ejection fraction, by estimation, is 60 to 65%. The left ventricle has normal function. The left ventricle has no regional wall motion abnormalities. There is mild concentric left ventricular hypertrophy. Left ventricular diastolic parameters are consistent with Grade II diastolic dysfunction  (pseudonormalization). Elevated left atrial pressure.   2. Right ventricular systolic function is normal. The right ventricular  size is normal. There is  mildly elevated pulmonary artery systolic  pressure. The estimated right ventricular systolic pressure is 26.3 mmHg.   3. The mitral valve is normal in structure. No evidence of mitral valve  regurgitation. No evidence of mitral stenosis.   4. The aortic valve is normal in structure. Aortic valve regurgitation is  not visualized. No aortic stenosis is present.   5. The inferior vena cava is dilated in size with <50% respiratory  variability, suggesting right atrial pressure of 15 mmHg.   FINDINGS   Left Ventricle: Left ventricular ejection fraction, by estimation, is 60  to 65%. The left ventricle has normal function. The left ventricle has no  regional wall motion abnormalities. The left ventricular internal cavity  size was normal in size. There is   mild concentric left ventricular hypertrophy. Left ventricular diastolic  parameters are consistent with Grade II diastolic dysfunction  (pseudonormalization). Elevated left atrial pressure.   Right Ventricle: The right ventricular size is normal. No increase in  right ventricular wall thickness. Right ventricular systolic function is  normal. There is mildly elevated pulmonary artery systolic pressure. The  tricuspid regurgitant  velocity is 2.31   m/s, and with an assumed right atrial pressure of 15 mmHg, the estimated  right ventricular systolic pressure is 48.5 mmHg.   Left Atrium: Left atrial size was normal in size.   Right Atrium: Right atrial size was normal in size.   Pericardium: There is no evidence of pericardial effusion.   Mitral Valve: The mitral valve is normal in structure. No evidence of  mitral valve regurgitation. No evidence of mitral valve stenosis.   Tricuspid Valve: The tricuspid valve is normal in structure. Tricuspid  valve regurgitation is trivial. No evidence of tricuspid stenosis.   Aortic Valve: The aortic valve is normal in structure. Aortic valve  regurgitation is not visualized. No aortic stenosis  is present. Aortic  valve mean gradient measures 7.0 mmHg. Aortic valve peak gradient measures  12.7 mmHg. Aortic valve area, by VTI  measures 3.09 cm.   Pulmonic Valve: The pulmonic valve was normal in structure. Pulmonic valve  regurgitation is not visualized. No evidence of pulmonic stenosis.   Aorta: The aortic root is normal in size and structure.   Venous: The inferior vena cava is dilated in size with less than 50% respiratory variability, suggesting right atrial pressure of 15 mmHg.   IAS/Shunts: No atrial level shunt detected by color flow Doppler.     Recent Labs: 03/23/2021: Hemoglobin 14.2; Platelets 135  Recent Lipid Panel    Component Value Date/Time   CHOL 118 11/12/2019 0046   TRIG 49 11/12/2019 0046   HDL 36 (L) 11/12/2019 0046   CHOLHDL 3.3 11/12/2019 0046   VLDL 10 11/12/2019 0046   LDLCALC 72 11/12/2019 0046    Physical Exam:    VS:  BP (!) 128/54   Pulse 67   Ht '5\' 6"'$  (1.676 m)   Wt 124 lb 12.8 oz (56.6 kg)   SpO2 98%   BMI 20.14 kg/m     Wt Readings from Last 3 Encounters:  07/23/21 124 lb 12.8 oz (56.6 kg)  03/23/21 125 lb 8 oz (56.9 kg)  01/26/21 125 lb 11.2 oz (57 kg)     GEN: Well nourished, well developed in no acute distress HEENT: Normal NECK: No JVD; No carotid bruits LYMPHATICS: No lymphadenopathy CARDIAC: S1S2 noted,RRR, no murmurs, rubs, gallops RESPIRATORY:  Clear to auscultation without rales, wheezing or rhonchi  ABDOMEN: Soft, non-tender, non-distended, +bowel sounds, no guarding. EXTREMITIES: No edema, No cyanosis, no clubbing MUSCULOSKELETAL:  No deformity  SKIN: Warm and dry NEUROLOGIC:  Alert and oriented x 3, non-focal PSYCHIATRIC:  Normal affect, good insight  ASSESSMENT:    1. PAD (peripheral artery disease) ([place])   2. Coronary artery disease involving native coronary artery of native heart without angina pectoris   3. Essential hypertension   4. Mixed hyperlipidemia    PLAN:    PAD - asymptomatic. Cont  current medication dose.  CAD - no anginal Blood pressure is acceptable, continue with current antihypertensive regimen. Hyperlipidemia - continue with current statin medication.  The patient is in agreement with the above plan. The patient left the office in stable condition.  The patient will follow up in 1 year or sooner if needed   Medication Adjustments/Labs and Tests Ordered: Current medicines are reviewed at length with the patient today.  Concerns regarding medicines are outlined above.  No orders of the defined types were placed in this encounter.  No orders of the defined types were placed in this encounter.   Patient Instructions  Medication Instructions:  Your physician  recommends that you continue on your current medications as directed. Please refer to the Current Medication list given to you today.  *If you need a refill on your cardiac medications before your next appointment, please call your pharmacy*   Lab Work: NONE If you have labs (blood work) drawn today and your tests are completely normal, you will receive your results only by: [place] (if you have MyChart) OR A paper copy in the mail If you have any lab test that is abnormal or we need to change your treatment, we will call you to review the results.   Testing/Procedures: NONE   Follow-Up: At [facility], you and your health needs are our priority.  As part of our continuing mission to provide you with exceptional heart care, we have created designated Provider Care Teams.  These Care Teams include your primary Cardiologist (physician) and Advanced Practice Providers (APPs -  Physician Assistants and Nurse Practitioners) who all work together to provide you with the care you need, when you need it.  We recommend signing up for the patient portal called "MyChart".  Sign up information is provided on this After Visit Summary.  MyChart is used to connect with patients for Virtual Visits  (Telemedicine).  Patients are able to view lab/test results, encounter notes, upcoming appointments, etc.  Non-urgent messages can be sent to your provider as well.   To learn more about what you can do with MyChart, go to [email or web address].    Your next appointment:   9 month(s)  The format for your next appointment:   In Person  Provider:   Berniece Salines, DO     Adopting a Healthy Lifestyle.  Know what a healthy weight is for you (roughly BMI <25) and aim to maintain this   Aim for 7+ servings of fruits and vegetables daily   65-80+ fluid ounces of water or unsweet tea for healthy kidneys   Limit to max 1 drink of alcohol per day; avoid smoking/tobacco   Limit animal fats in diet for cholesterol and heart health - choose grass fed whenever available   Avoid highly processed foods, and foods high in saturated/trans fats   Aim for low stress - take time to unwind and care for your mental health   Aim for 150 min of moderate intensity exercise weekly for heart health, and weights twice weekly for bone health   Aim for 7-9 hours of sleep daily   When it comes to diets, agreement about the perfect plan isnt easy to find, even among the experts. Experts at the [employer] developed an idea known as the Healthy Eating Plate. Just imagine a plate divided into logical, healthy portions.   The emphasis is on diet quality:   Load up on vegetables and fruits - one-half of your plate: Aim for color and variety, and remember that potatoes dont count.   Go for whole grains - one-quarter of your plate: Whole wheat, barley, wheat berries, quinoa, oats, brown rice, and foods made with them. If you want pasta, go with whole wheat pasta.   Protein power - one-quarter of your plate: Fish, chicken, beans, and nuts are all healthy, versatile protein sources. Limit red meat.   The diet, however, does go beyond the plate, offering a few other suggestions.   Use healthy  plant oils, such as olive, canola, soy, corn, sunflower and peanut. Check the labels, and avoid partially hydrogenated oil, which have unhealthy trans fats.  If youre thirsty, drink water. Coffee and tea are good in moderation, but skip sugary drinks and limit milk and dairy products to one or two daily servings.   The type of carbohydrate in the diet is more important than the amount. Some sources of carbohydrates, such as vegetables, fruits, whole grains, and beans-are healthier than others.   Finally, stay active  Signed, Katherine Salines, DO  07/26/2021 9:32 PM    [facility]

## 2021-08-02 DIAGNOSIS — I10 Essential (primary) hypertension: Secondary | ICD-10-CM | POA: Diagnosis not present

## 2021-08-03 DIAGNOSIS — I69354 Hemiplegia and hemiparesis following cerebral infarction affecting left non-dominant side: Secondary | ICD-10-CM | POA: Diagnosis not present

## 2021-08-03 DIAGNOSIS — I739 Peripheral vascular disease, unspecified: Secondary | ICD-10-CM | POA: Diagnosis not present

## 2021-08-05 DIAGNOSIS — E785 Hyperlipidemia, unspecified: Secondary | ICD-10-CM | POA: Diagnosis not present

## 2021-08-11 DIAGNOSIS — I10 Essential (primary) hypertension: Secondary | ICD-10-CM | POA: Diagnosis not present

## 2021-08-11 DIAGNOSIS — M810 Age-related osteoporosis without current pathological fracture: Secondary | ICD-10-CM | POA: Diagnosis not present

## 2021-08-11 DIAGNOSIS — Z6820 Body mass index (BMI) 20.0-20.9, adult: Secondary | ICD-10-CM | POA: Diagnosis not present

## 2021-08-11 DIAGNOSIS — E785 Hyperlipidemia, unspecified: Secondary | ICD-10-CM | POA: Diagnosis not present

## 2021-08-13 ENCOUNTER — Ambulatory Visit: Payer: Medicare HMO

## 2021-08-13 NOTE — Progress Notes (Deleted)
[facility] HISTORY AND PHYSICAL   History of Present Illness:  Patient is a 66 y.o. year old female who presents for evaluation of PAD.  She has history of rest pain and is s/p  Right axillary bifemoral bypass with bilateral common femoral endarterectomy by Dr. Oneida Alar 11/11/19.     She deies symptoms of claudication, non healing wounds or rest or pain.    The pt is on a statin for cholesterol management.    The pt is on an aspirin.    Other AC:  none The pt is on CCB and hydralazine for hypertension.  The pt does not have diabetes. Tobacco hx:  former   Past Medical History:  Diagnosis Date   Cervical polyp 09/18/2018   Critical lower limb ischemia (HCC)    Encounter for screening mammogram for breast cancer 05/05/2016   Fibrocystic breast changes of both breasts 04/29/2015   Hyperlipidemia    Hypertension    PAD (peripheral artery disease) ([place]) 08/23/2019   Stroke ([place]) 08/23/2019    Past Surgical History:  Procedure Laterality Date   ABDOMINAL AORTOGRAM W/LOWER EXTREMITY Bilateral 08/23/2019   Procedure: ABDOMINAL AORTOGRAM W/ Bilateral LOWER EXTREMITY Run[place];  Surgeon: [name], MD;  Location: [facility];  Service: Cardiovascular;  Laterality: Bilateral;   AXILLARY-FEMORAL BYPASS GRAFT Right 11/11/2019   Procedure: BYPASS GRAFT RIGHT AXILLA-BIFEMORAL;  Surgeon: [name], MD;  Location: [facility];  Service: Vascular;  Laterality: Right;   BRAIN SURGERY     BREAST SURGERY Bilateral 2014   1980 R breast surgery   CEREBRAL ANEURYSM REPAIR     ENDARTERECTOMY FEMORAL Bilateral 11/11/2019   Procedure: ENDARTERECTOMY FEMORAL BILATERALLY;  Surgeon: [name], MD;  Location: [facility];  Service: Vascular;  Laterality: Bilateral;   IR RADIOLOGIST EVAL & MGMT  04/02/2020   IR RADIOLOGIST EVAL & MGMT  03/29/2021   TUBAL LIGATION  1990    ROS:   General:  No weight loss, Fever, chills  HEENT: No recent headaches, no nasal bleeding, no  visual changes, no sore throat  Neurologic: No dizziness, blackouts, seizures. No recent symptoms of stroke or mini- stroke. No recent episodes of slurred speech, or temporary blindness.  Cardiac: No recent episodes of chest pain/pressure, no shortness of breath at rest.  No shortness of breath with exertion.  Denies history of atrial fibrillation or irregular heartbeat  Vascular: No history of rest pain in feet.  No history of claudication.  No history of non-healing ulcer, No history of DVT   Pulmonary: No home oxygen, no productive cough, no hemoptysis,  No asthma or wheezing  Musculoskeletal:  '[ ]'$  Arthritis, '[ ]'$  Low back pain,  '[ ]'$  Joint pain  Hematologic:No history of hypercoagulable state.  No history of easy bleeding.  No history of anemia  Gastrointestinal: No hematochezia or melena,  No gastroesophageal reflux, no trouble swallowing  Urinary: '[ ]'$  chronic Kidney disease, '[ ]'$  on HD - '[ ]'$  MWF or '[ ]'$  TTHS, '[ ]'$  Burning with urination, '[ ]'$  Frequent urination, '[ ]'$  Difficulty urinating;   Skin: No rashes  Psychological: No history of anxiety,  No history of depression  Social History Social History   Tobacco Use   Smoking status: Former    Packs/day: 0.25    Types: Cigarettes    Quit date: 10/04/2019    Years since quitting: 1.8   Smokeless tobacco: Never  Vaping Use   Vaping Use: Never used  Substance  Use Topics   Alcohol use: No   Drug use: No    Family History Family History  Problem Relation Age of Onset   Heart Problems Father    Heart Problems Brother    Heart Problems Brother     Allergies  Allergies  Allergen Reactions   Amoxicillin Other (See Comments)    Skin infection on face   Penicillins Other (See Comments)    Shaky and Dizziness 20 years ago     Current Outpatient Medications  Medication Sig Dispense Refill   alendronate (FOSAMAX) 70 MG/75ML solution Take 70 mg by mouth once a week.     aspirin 81 MG EC tablet Take 81 mg by mouth daily.      atorvastatin (LIPITOR) 80 MG tablet Take 1 tablet (80 mg total) by mouth daily. MUST MAKE APPT FOR FURTHER REFILLS 30 tablet 0   cilostazol (PLETAL) 50 MG tablet Take 1 tablet (50 mg total) by mouth 2 (two) times daily. 180 tablet 3   diltiazem (TIADYLT ER) 180 MG 24 hr capsule TAKE 1 CAPSULE BY MOUTH EVERY DAY 90 capsule 3   hydrALAZINE (APRESOLINE) 50 MG tablet Take 1 tablet (50 mg total) by mouth 3 (three) times daily. 270 tablet 3   hydrochlorothiazide (HYDRODIURIL) 25 MG tablet Take 25 mg by mouth daily.     No current facility-administered medications for this visit.    Physical Examination  There were no vitals filed for this visit.  There is no height or weight on file to calculate BMI.  General:  Alert and oriented, no acute distress HEENT: Normal Neck: No bruit or JVD Pulmonary: Clear to auscultation bilaterally Cardiac: Regular Rate and Rhythm without murmur Abdomen: Soft, non-tender, non-distended, no mass, no scars Skin: No rash Extremity Pulses:  2+ radial, brachial, femoral, dorsalis pedis, posterior tibial pulses bilaterally Musculoskeletal: No deformity or edema  Neurologic: Upper and lower extremity motor 5/5 and symmetric  DATA: ***   ASSESSMENT: ***   PLAN: ***   Ruta Hinds, MD [facility] Office: [phone] Pager: [phone]

## 2021-08-25 ENCOUNTER — Ambulatory Visit (INDEPENDENT_AMBULATORY_CARE_PROVIDER_SITE_OTHER): Payer: Medicare HMO | Admitting: Physician Assistant

## 2021-08-25 VITALS — BP 143/71 | HR 60 | Temp 97.9°F | Ht 65.0 in | Wt 124.6 lb

## 2021-08-25 DIAGNOSIS — I739 Peripheral vascular disease, unspecified: Secondary | ICD-10-CM | POA: Diagnosis not present

## 2021-08-25 NOTE — Progress Notes (Signed)
Office Note     CC:  follow up Requesting Provider:  [name], MD  HPI: Katherine Archer is a 66 y.o. (06-12-1955) female who presents for surveillance of right ax bifemoral bypass graft performed by Dr. Oneida Alar due to bilateral lower extremity rest pain.  The patient denies any claudication, rest pain, or tissue loss of bilateral lower extremities.  She has known bilateral SFA occlusions.  She is a former smoker.  She is on aspirin and statin daily.  She is accompanied by her daughter who is acting as a Optometrist for today's visit.  It should be noted that we are reviewing imaging results from June of this year.  She missed her follow-up appointment to go over the studies and then was out of town for a period of time thereafter.   Past Medical History:  Diagnosis Date   Cervical polyp 09/18/2018   Critical lower limb ischemia (HCC)    Encounter for screening mammogram for breast cancer 05/05/2016   Fibrocystic breast changes of both breasts 04/29/2015   Hyperlipidemia    Hypertension    PAD (peripheral artery disease) ([place]) 08/23/2019   Stroke ([place]) 08/23/2019    Past Surgical History:  Procedure Laterality Date   ABDOMINAL AORTOGRAM W/LOWER EXTREMITY Bilateral 08/23/2019   Procedure: ABDOMINAL AORTOGRAM W/ Bilateral LOWER EXTREMITY Runoff;  Surgeon: [name], MD;  Location: [facility];  Service: Cardiovascular;  Laterality: Bilateral;   AXILLARY-FEMORAL BYPASS GRAFT Right 11/11/2019   Procedure: BYPASS GRAFT RIGHT AXILLA-BIFEMORAL;  Surgeon: [name], MD;  Location: [facility];  Service: Vascular;  Laterality: Right;   BRAIN SURGERY     BREAST SURGERY Bilateral 2014   1980 R breast surgery   CEREBRAL ANEURYSM REPAIR     ENDARTERECTOMY FEMORAL Bilateral 11/11/2019   Procedure: ENDARTERECTOMY FEMORAL BILATERALLY;  Surgeon: [name], MD;  Location: [facility];  Service: Vascular;  Laterality: Bilateral;   IR RADIOLOGIST EVAL & MGMT  04/02/2020   IR RADIOLOGIST  EVAL & MGMT  03/29/2021   TUBAL LIGATION  1990    Social History   Socioeconomic History   Marital status: Divorced    Spouse name: Not on file   Number of children: Not on file   Years of education: Not on file   Highest education level: Not on file  Occupational History   Not on file  Tobacco Use   Smoking status: Former    Packs/day: 0.25    Types: Cigarettes    Quit date: 10/04/2019    Years since quitting: 1.8   Smokeless tobacco: Never  Vaping Use   Vaping Use: Never used  Substance and Sexual Activity   Alcohol use: No   Drug use: No   Sexual activity: Not Currently  Other Topics Concern   Not on file  Social History Narrative   Not on file   Social Determinants of Health   Financial Resource Strain: Not on file  Food Insecurity: No Food Insecurity (06/29/2020)   Hunger Vital Sign    Worried About Running Out of Food in the Last Year: Never true    Ran Out of Food in the Last Year: Never true  Transportation Needs: No Transportation Needs (06/29/2020)   PRAPARE - Hydrologist (Medical): No    Lack of Transportation (Non-Medical): No  Physical Activity: Not on file  Stress: Not on file  Social Connections: Not on file  Intimate Partner Violence: Not on file  Family History  Problem Relation Age of Onset   Heart Problems Father    Heart Problems Brother    Heart Problems Brother     Current Outpatient Medications  Medication Sig Dispense Refill   aspirin 81 MG EC tablet Take 81 mg by mouth daily.     atorvastatin (LIPITOR) 80 MG tablet Take 1 tablet (80 mg total) by mouth daily. MUST MAKE APPT FOR FURTHER REFILLS 30 tablet 0   hydrochlorothiazide (HYDRODIURIL) 25 MG tablet Take 25 mg by mouth daily.     alendronate (FOSAMAX) 70 MG/75ML solution Take 70 mg by mouth once a week. (Patient not taking: Reported on 08/25/2021)     cilostazol (PLETAL) 50 MG tablet Take 1 tablet (50 mg total) by mouth 2 (two) times daily. (Patient  not taking: Reported on 08/25/2021) 180 tablet 3   diltiazem (TIADYLT ER) 180 MG 24 hr capsule TAKE 1 CAPSULE BY MOUTH EVERY DAY (Patient not taking: Reported on 08/25/2021) 90 capsule 3   hydrALAZINE (APRESOLINE) 50 MG tablet Take 1 tablet (50 mg total) by mouth 3 (three) times daily. 270 tablet 3   No current facility-administered medications for this visit.    Allergies  Allergen Reactions   Amoxicillin Other (See Comments)    Skin infection on face   Penicillins Other (See Comments)    Shaky and Dizziness 20 years ago     REVIEW OF SYSTEMS:   '[X]'$  denotes positive finding, '[ ]'$  denotes negative finding Cardiac  Comments:  Chest pain or chest pressure:    Shortness of breath upon exertion:    Short of breath when lying flat:    Irregular heart rhythm:        Vascular    Pain in calf, thigh, or hip brought on by ambulation:    Pain in feet at night that wakes you up from your sleep:     Blood clot in your veins:    Leg swelling:         Pulmonary    Oxygen at home:    Productive cough:     Wheezing:         Neurologic    Sudden weakness in arms or legs:     Sudden numbness in arms or legs:     Sudden onset of difficulty speaking or slurred speech:    Temporary loss of vision in one eye:     Problems with dizziness:         Gastrointestinal    Blood in stool:     Vomited blood:         Genitourinary    Burning when urinating:     Blood in urine:        Psychiatric    Major depression:         Hematologic    Bleeding problems:    Problems with blood clotting too easily:        Skin    Rashes or ulcers:        Constitutional    Fever or chills:      PHYSICAL EXAMINATION:  Vitals:   08/25/21 0959  BP: (!) 143/71  Pulse: 60  Temp: 97.9 F (36.6 C)  TempSrc: Temporal  SpO2: 99%  Weight: 124 lb 9.6 oz (56.5 kg)  Height: '5\' 5"'$  (1.651 m)    General:  WDWN in NAD; vital signs documented above Gait: Not observed HENT: WNL, normocephalic Pulmonary:  normal non-labored breathing , without Rales, rhonchi,  wheezing  Cardiac: regular HR Abdomen: soft, NT, no masses Skin: without rashes Vascular Exam/Pulses: 2+ palpable right axillary to femoral bypass graft; 2+ palpable femoral to femoral bypass graft; 2+ palpable left femoral pulse; absent pedal pulses Extremities: without ischemic changes, without Gangrene , without cellulitis; without open wounds;  Musculoskeletal: no muscle wasting or atrophy  Neurologic: A&O X 3;  No focal weakness or paresthesias are detected Psychiatric:  The pt has Normal affect.   Non-Invasive Vascular Imaging:   Patent right axis to femoral bypass Somewhat sluggish flow from right to left femoral to femoral bypass  ABI/TBIToday's ABIToday's TBIPrevious ABIPrevious TBI  +-------+-----------+-----------+------------+------------+  Right  0.54       0.24       0.50        0             +-------+-----------+-----------+------------+------------+  Left   0.73       0.39       0.69        0.31    ASSESSMENT/PLAN:: 66 y.o. female who presents to clinic for surveillance of right ax bifemoral bypass graft  -Subjectively patient continues to be without claudication, rest pain, or nonhealing wounds of bilateral lower extremities -On physical exam patient has easily palpable pulses of the axillofemoral and femorofemoral bypass grafts as well as an easily palpable left femoral pulse.  Arterial duplex demonstrates somewhat sluggish flow of through the femoral to femoral bypass graft however given the strength of the pulse on exam we will continue to manage this conservatively and recheck surveillance in 3 months time.  If at that time flow continues to be sluggish on duplex, she may need to be considered for angiography.  Continue aspirin and statin daily.  She knows to call the office or return to office sooner if she develops any ischemic symptoms.   Dagoberto Ligas, PA-C [facility] [phone]  Clinic MD:   Donzetta Matters

## 2021-08-26 DIAGNOSIS — N6012 Diffuse cystic mastopathy of left breast: Secondary | ICD-10-CM | POA: Diagnosis not present

## 2021-08-26 DIAGNOSIS — Z1231 Encounter for screening mammogram for malignant neoplasm of breast: Secondary | ICD-10-CM | POA: Diagnosis not present

## 2021-08-26 DIAGNOSIS — N6011 Diffuse cystic mastopathy of right breast: Secondary | ICD-10-CM | POA: Diagnosis not present

## 2021-08-31 ENCOUNTER — Other Ambulatory Visit: Payer: Self-pay

## 2021-08-31 DIAGNOSIS — I70229 Atherosclerosis of native arteries of extremities with rest pain, unspecified extremity: Secondary | ICD-10-CM

## 2021-08-31 DIAGNOSIS — I739 Peripheral vascular disease, unspecified: Secondary | ICD-10-CM

## 2021-09-01 DIAGNOSIS — Z4689 Encounter for fitting and adjustment of other specified devices: Secondary | ICD-10-CM | POA: Diagnosis not present

## 2021-09-01 DIAGNOSIS — N819 Female genital prolapse, unspecified: Secondary | ICD-10-CM | POA: Diagnosis not present

## 2021-09-02 DIAGNOSIS — I10 Essential (primary) hypertension: Secondary | ICD-10-CM | POA: Diagnosis not present

## 2021-09-03 DIAGNOSIS — I739 Peripheral vascular disease, unspecified: Secondary | ICD-10-CM | POA: Diagnosis not present

## 2021-09-03 DIAGNOSIS — E785 Hyperlipidemia, unspecified: Secondary | ICD-10-CM | POA: Diagnosis not present

## 2021-10-03 DIAGNOSIS — E785 Hyperlipidemia, unspecified: Secondary | ICD-10-CM | POA: Diagnosis not present

## 2021-10-03 DIAGNOSIS — I739 Peripheral vascular disease, unspecified: Secondary | ICD-10-CM | POA: Diagnosis not present

## 2021-10-15 DIAGNOSIS — M810 Age-related osteoporosis without current pathological fracture: Secondary | ICD-10-CM | POA: Diagnosis not present

## 2021-10-27 NOTE — Progress Notes (Signed)
HISTORY AND PHYSICAL     CC:  follow up. Requesting Provider:  [name], MD  HPI: This is a 66 y.o. female who is here today for follow up for PAD.  Pt has hx of right axillary bifemoral bypass grafting with bilateral common femoral artery endarterectomies for rest pain in both feet 11/11/2019 by Dr. Oneida Alar.   Pt was last seen 08/25/2021 and at that time, she was doing well without claudication, rest pain or non healing wounds.  Her grafts were easily palpable.  Her arterial duplex demonstrated somewhat sluggish flow through the femoral femoral bypass, however, given the strength of the pulse on exam, it was felt conservative management for now with close follow up.    The pt returns today for follow up and here with her daughter translating.   Pt states that she has had sores on her feet for more than a year that come and go.  She had something stab her in the right heel about a year ago.  It really has not gotten better and still painful.  She denies any pain in her feet that wake her at night.  She does not have any cramping in her legs when she walks.  She states she has a feeling of stretching in the left inner thigh at times.    The pt is on a statin for cholesterol management.    The pt is on an aspirin.    Other AC:  Pletal The pt is on hydralazine, HCTZ for hypertension.  The pt does not have diabetes. Tobacco hx:  former   Past Medical History:  Diagnosis Date   Cervical polyp 09/18/2018   Critical lower limb ischemia (HCC)    Encounter for screening mammogram for breast cancer 05/05/2016   Fibrocystic breast changes of both breasts 04/29/2015   Hyperlipidemia    Hypertension    PAD (peripheral artery disease) ([place]) 08/23/2019   Stroke ([place]) 08/23/2019    Past Surgical History:  Procedure Laterality Date   ABDOMINAL AORTOGRAM W/LOWER EXTREMITY Bilateral 08/23/2019   Procedure: ABDOMINAL AORTOGRAM W/ Bilateral LOWER EXTREMITY Runoff;  Surgeon: [name], MD;   Location: [facility];  Service: Cardiovascular;  Laterality: Bilateral;   AXILLARY-FEMORAL BYPASS GRAFT Right 11/11/2019   Procedure: BYPASS GRAFT RIGHT AXILLA-BIFEMORAL;  Surgeon: [name], MD;  Location: [facility];  Service: Vascular;  Laterality: Right;   BRAIN SURGERY     BREAST SURGERY Bilateral 2014   1980 R breast surgery   CEREBRAL ANEURYSM REPAIR     ENDARTERECTOMY FEMORAL Bilateral 11/11/2019   Procedure: ENDARTERECTOMY FEMORAL BILATERALLY;  Surgeon: [name], MD;  Location: [facility];  Service: Vascular;  Laterality: Bilateral;   IR RADIOLOGIST EVAL & MGMT  04/02/2020   IR RADIOLOGIST EVAL & MGMT  03/29/2021   TUBAL LIGATION  1990    Allergies  Allergen Reactions   Amoxicillin Other (See Comments)    Skin infection on face   Penicillins Other (See Comments)    Shaky and Dizziness 20 years ago    Current Outpatient Medications  Medication Sig Dispense Refill   alendronate (FOSAMAX) 70 MG/75ML solution Take 70 mg by mouth once a week. (Patient not taking: Reported on 08/25/2021)     aspirin 81 MG EC tablet Take 81 mg by mouth daily.     atorvastatin (LIPITOR) 80 MG tablet Take 1 tablet (80 mg total) by mouth daily. MUST MAKE APPT FOR FURTHER REFILLS 30 tablet 0   cilostazol (PLETAL)  50 MG tablet Take 1 tablet (50 mg total) by mouth 2 (two) times daily. (Patient not taking: Reported on 08/25/2021) 180 tablet 3   diltiazem (TIADYLT ER) 180 MG 24 hr capsule TAKE 1 CAPSULE BY MOUTH EVERY DAY (Patient not taking: Reported on 08/25/2021) 90 capsule 3   hydrALAZINE (APRESOLINE) 50 MG tablet Take 1 tablet (50 mg total) by mouth 3 (three) times daily. 270 tablet 3   hydrochlorothiazide (HYDRODIURIL) 25 MG tablet Take 25 mg by mouth daily.     No current facility-administered medications for this visit.    Family History  Problem Relation Age of Onset   Heart Problems Father    Heart Problems Brother    Heart Problems Brother     Social History   Socioeconomic  History   Marital status: Divorced    Spouse name: Not on file   Number of children: Not on file   Years of education: Not on file   Highest education level: Not on file  Occupational History   Not on file  Tobacco Use   Smoking status: Former    Packs/day: 0.25    Types: Cigarettes    Quit date: 10/04/2019    Years since quitting: 2.0   Smokeless tobacco: Never  Vaping Use   Vaping Use: Never used  Substance and Sexual Activity   Alcohol use: No   Drug use: No   Sexual activity: Not Currently  Other Topics Concern   Not on file  Social History Narrative   Not on file   Social Determinants of Health   Financial Resource Strain: Not on file  Food Insecurity: No Food Insecurity (06/29/2020)   Hunger Vital Sign    Worried About Running Out of Food in the Last Year: Never true    Ran Out of Food in the Last Year: Never true  Transportation Needs: No Transportation Needs (06/29/2020)   PRAPARE - Hydrologist (Medical): No    Lack of Transportation (Non-Medical): No  Physical Activity: Not on file  Stress: Not on file  Social Connections: Not on file  Intimate Partner Violence: Not on file     REVIEW OF SYSTEMS:   '[X]'$  denotes positive finding, '[ ]'$  denotes negative finding Cardiac  Comments:  Chest pain or chest pressure:    Shortness of breath upon exertion:    Short of breath when lying flat:    Irregular heart rhythm:        Vascular    Pain in calf, thigh, or hip brought on by ambulation:    Pain in feet at night that wakes you up from your sleep:     Blood clot in your veins:    Leg swelling:         Pulmonary    Oxygen at home:    Productive cough:     Wheezing:         Neurologic    Sudden weakness in arms or legs:     Sudden numbness in arms or legs:     Sudden onset of difficulty speaking or slurred speech:    Temporary loss of vision in one eye:     Problems with dizziness:         Gastrointestinal    Blood in stool:      Vomited blood:         Genitourinary    Burning when urinating:     Blood in urine:  Psychiatric    Major depression:         Hematologic    Bleeding problems:    Problems with blood clotting too easily:        Skin    Rashes or ulcers:        Constitutional    Fever or chills:      PHYSICAL EXAMINATION:  Today's Vitals   11/01/21 0848  BP: (!) 159/76  Pulse: (!) 49  Resp: 20  Temp: (!) 97.3 F (36.3 C)  TempSrc: Temporal  SpO2: 100%  Weight: 124 lb 6.4 oz (56.4 kg)  Height: '5\' 5"'$  (1.651 m)   Body mass index is 20.7 kg/m.   General:  WDWN in NAD; vital signs documented above Gait: Not observed HENT: WNL, normocephalic Pulmonary: normal non-labored breathing , without wheezing Cardiac: regular HR, with left carotid bruit Abdomen: soft, NT; aortic pulse is not palpable Skin: without rashes Vascular Exam/Pulses:  Right Left  Radial 2+ (normal) 2+ (normal)  Femoral 2+ (normal) 2+ (normal)  DP Faint doppler flow Biphasic doppler flow  PT Monophasic doppler flow Monophasic doppler flow   Extremities: without ischemic changes but does have a callus on plantar aspect of both feet and right 3rd toe; palpable pulse in fem fem graft  Left foot  Right foot  Right toe   Neurologic: A&O X 3 Psychiatric:  The pt has Normal affect.   Non-Invasive Vascular Imaging:   ABI's/TBI's on 11/01/2021: Right:  0.47/0.22 - Great toe pressure: 38 Left:  0.83/0.43 - Great toe pressure: 75  Arterial duplex on 11/01/2021: Fem Fem Graft: Right to Left  +------------------+--------+--------+----------+-------------------+                    PSV cm/sStenosisWaveform  Comments             +------------------+--------+--------+----------+-------------------+  Inflow            68              biphasic  distal ax-fem graft  +------------------+--------+--------+----------+-------------------+  Prox anastomosis  51              monophasic                      +------------------+--------+--------+----------+-------------------+  Proximal graft    56              monophasic                     +------------------+--------+--------+----------+-------------------+  Mid graft         37              monophasic                     +------------------+--------+--------+----------+-------------------+  Distal graft      41              monophasic                     +------------------+--------+--------+----------+-------------------+  Distal anastomosis38              monophasic                     +------------------+--------+--------+----------+-------------------+  Outflow           28              monophasic                     +------------------+--------+--------+----------+-------------------+   +----------+--------+-----+--------+----------+--------+  RIGHT     PSV cm/sRatioStenosisWaveform  Comments  +----------+--------+-----+--------+----------+--------+  CFA Distal             occluded                    +----------+--------+-----+--------+----------+--------+  DFA       115                  monophasic          +----------+--------+-----+--------+----------+--------+  SFA Prox               occluded                    +----------+--------+-----+--------+----------+--------+   Right Graft #1: Right ax-fem  +------------------+--------+--------+----------+--------+                    PSV cm/sStenosisWaveform  Comments  +------------------+--------+--------+----------+--------+  Inflow            167             biphasic            +------------------+--------+--------+----------+--------+  Prox Anastomosis  120             biphasic            +------------------+--------+--------+----------+--------+  Proximal Graft    129             monophasic          +------------------+--------+--------+----------+--------+  Mid Graft         101              monophasic          +------------------+--------+--------+----------+--------+  Distal Graft      68              biphasic            +------------------+--------+--------+----------+--------+  Distal Anastomosis28              monophasic          +------------------+--------+--------+----------+--------+  Outflow           0       occluded                    +------------------+--------+--------+----------+--------+     +----------+--------+-----+--------+----------+----------------------------  LEFT      PSV cm/sRatioStenosisWaveform  Comments                      +----------+--------+-----+--------+----------+----------------------------   CFA Distal28                             right to left fem-fem outflow +----------+--------+-----+--------+----------+----------------------------  DFA       156                  monophasic                              +----------+--------+-----+--------+----------+----------------------------  SFA Prox  71                   biphasic                                  +----------+--------+-----+--------+----------+----------------------------   Summary:  Right: Patent right ax-fem bypass graft with outflow (CFA) and SFA occlusion.  Patent right PFA.  Patent right to left fem-fem bypass graft.   Previous ABI's/TBI's on 06/17/2021: Right:  0.54/0.24 - Great toe pressure: 34 Left:  0.73/0.39 - Great toe pressure:  55  Previous arterial duplex on 06/17/2021: Fem Fem Graft: Right to Left  +------------------+--------+--------+----------+--------+                    PSV cm/sStenosisWaveform  Comments  +------------------+--------+--------+----------+--------+  Inflow            66              monophasic          +------------------+--------+--------+----------+--------+  Prox anastomosis  66              biphasic  brisk     +------------------+--------+--------+----------+--------+   Proximal graft    71              biphasic            +------------------+--------+--------+----------+--------+  Mid graft         34              monophasic          +------------------+--------+--------+----------+--------+  Distal graft      46              biphasic  dampened  +------------------+--------+--------+----------+--------+  Distal anastomosis64              monophasicbrisk     +------------------+--------+--------+----------+--------+  Outflow           64              monophasicbrisk     +------------------+--------+--------+----------+--------+   Right EIA / CFA area 288 cm/s, visualized plaque. Left CFA area 125 cm/s brisk monophasic waveform   Left Graft #1: Axillary to bifemoral  +--------------------+--------+--------+----------+--------+                      PSV cm/sStenosisWaveform  Comments  +--------------------+--------+--------+----------+--------+  Inflow              184             biphasic            +--------------------+--------+--------+----------+--------+  Proximal Anastomosis183             biphasic            +--------------------+--------+--------+----------+--------+  Proximal Graft      87              biphasic  dampened  +--------------------+--------+--------+----------+--------+  Mid Graft           111             biphasic            +--------------------+--------+--------+----------+--------+  Distal Graft        67              biphasic  broad     +--------------------+--------+--------+----------+--------+  Distal Anastomosis  75              biphasic            +--------------------+--------+--------+----------+--------+  Outflow             99              monophasicbrisk     +--------------------+--------+--------+----------+--------+    Summary:  Patent right axillary - bifemoral bypass graft with no evidence of restenosis.   Increased velocity 288  cm/s  in the right EIA / CFA area with visualized plaque in the 50 - 74 stenosis range     ASSESSMENT/PLAN:: 66 y.o. female here for follow up for PAD with hx of right axillary bifemoral bypass grafting with bilateral common femoral artery endarterectomies for rest pain in both feet 11/11/2019 by Dr. Oneida Alar.   PAD -pt comes in today for surveillance for PAD with hx of right  ax bifem bypass grafting in 2021 by Dr. Oneida Alar.  She does have callus/sore on plantar aspect of both feet that she says comes and goes but has not fully healed.   Discussed with proceeding with angiogram, however, daughter states she had stroke after last angiogram and would prefer to avoid that for now.  Given results of duplex today with sluggish flow in the fem fem graft and outflow, will set her up for CTA c/a/p with runoff and see MD back in the office for further discussion.  They are in agreement with this plan.  -continue asa/statin  Left carotid bruit Duplex in January 2023 revealed 1-39% bilateral ICA stenosis   Leontine Locket, [facility] MD:   Trula Slade

## 2021-11-01 ENCOUNTER — Other Ambulatory Visit: Payer: Self-pay | Admitting: Vascular Surgery

## 2021-11-01 ENCOUNTER — Ambulatory Visit (HOSPITAL_COMMUNITY)
Admission: RE | Admit: 2021-11-01 | Discharge: 2021-11-01 | Disposition: A | Discharge status: Home / Self Care | Payer: Medicare HMO | Source: Ambulatory Visit | Attending: Vascular Surgery | Admitting: Vascular Surgery

## 2021-11-01 ENCOUNTER — Ambulatory Visit (INDEPENDENT_AMBULATORY_CARE_PROVIDER_SITE_OTHER): Payer: Medicare HMO | Admitting: Physician Assistant

## 2021-11-01 ENCOUNTER — Ambulatory Visit (INDEPENDENT_AMBULATORY_CARE_PROVIDER_SITE_OTHER)
Admission: RE | Admit: 2021-11-01 | Discharge: 2021-11-01 | Disposition: A | Discharge status: Home / Self Care | Payer: Medicare HMO | Source: Ambulatory Visit | Attending: Vascular Surgery | Admitting: Vascular Surgery

## 2021-11-01 VITALS — BP 159/76 | HR 49 | Temp 97.3°F | Resp 20 | Ht 65.0 in | Wt 124.4 lb

## 2021-11-01 DIAGNOSIS — I70229 Atherosclerosis of native arteries of extremities with rest pain, unspecified extremity: Secondary | ICD-10-CM | POA: Diagnosis not present

## 2021-11-01 DIAGNOSIS — I739 Peripheral vascular disease, unspecified: Secondary | ICD-10-CM

## 2021-11-02 DIAGNOSIS — I10 Essential (primary) hypertension: Secondary | ICD-10-CM | POA: Diagnosis not present

## 2021-11-03 DIAGNOSIS — I739 Peripheral vascular disease, unspecified: Secondary | ICD-10-CM | POA: Diagnosis not present

## 2021-11-03 DIAGNOSIS — E785 Hyperlipidemia, unspecified: Secondary | ICD-10-CM | POA: Diagnosis not present

## 2021-11-11 ENCOUNTER — Other Ambulatory Visit: Payer: Self-pay

## 2021-11-11 DIAGNOSIS — I739 Peripheral vascular disease, unspecified: Secondary | ICD-10-CM

## 2021-11-11 DIAGNOSIS — E785 Hyperlipidemia, unspecified: Secondary | ICD-10-CM | POA: Diagnosis not present

## 2021-11-11 DIAGNOSIS — I10 Essential (primary) hypertension: Secondary | ICD-10-CM | POA: Diagnosis not present

## 2021-11-15 ENCOUNTER — Encounter (HOSPITAL_COMMUNITY): Payer: Medicare HMO

## 2021-11-15 ENCOUNTER — Ambulatory Visit: Payer: Medicare HMO

## 2021-11-15 ENCOUNTER — Ambulatory Visit: Payer: Medicare HMO | Admitting: Surgery

## 2021-11-18 DIAGNOSIS — Z1339 Encounter for screening examination for other mental health and behavioral disorders: Secondary | ICD-10-CM | POA: Diagnosis not present

## 2021-11-18 DIAGNOSIS — I739 Peripheral vascular disease, unspecified: Secondary | ICD-10-CM | POA: Diagnosis not present

## 2021-11-18 DIAGNOSIS — Z0000 Encounter for general adult medical examination without abnormal findings: Secondary | ICD-10-CM | POA: Diagnosis not present

## 2021-11-18 DIAGNOSIS — Z681 Body mass index (BMI) 19 or less, adult: Secondary | ICD-10-CM | POA: Diagnosis not present

## 2021-11-18 DIAGNOSIS — Z1231 Encounter for screening mammogram for malignant neoplasm of breast: Secondary | ICD-10-CM | POA: Diagnosis not present

## 2021-11-18 DIAGNOSIS — Z139 Encounter for screening, unspecified: Secondary | ICD-10-CM | POA: Diagnosis not present

## 2021-11-18 DIAGNOSIS — M810 Age-related osteoporosis without current pathological fracture: Secondary | ICD-10-CM | POA: Diagnosis not present

## 2021-11-18 DIAGNOSIS — E785 Hyperlipidemia, unspecified: Secondary | ICD-10-CM | POA: Diagnosis not present

## 2021-11-18 DIAGNOSIS — Z1331 Encounter for screening for depression: Secondary | ICD-10-CM | POA: Diagnosis not present

## 2021-11-18 DIAGNOSIS — Z720 Tobacco use: Secondary | ICD-10-CM | POA: Diagnosis not present

## 2021-11-18 DIAGNOSIS — I10 Essential (primary) hypertension: Secondary | ICD-10-CM | POA: Diagnosis not present

## 2021-11-18 DIAGNOSIS — Z136 Encounter for screening for cardiovascular disorders: Secondary | ICD-10-CM | POA: Diagnosis not present

## 2021-11-22 ENCOUNTER — Ambulatory Visit (HOSPITAL_COMMUNITY): Payer: Medicare HMO

## 2021-11-26 ENCOUNTER — Ambulatory Visit (HOSPITAL_COMMUNITY): Payer: Medicare HMO

## 2021-11-26 ENCOUNTER — Ambulatory Visit (HOSPITAL_COMMUNITY): Admission: RE | Admit: 2021-11-26 | Payer: Medicare HMO | Source: Ambulatory Visit

## 2021-11-29 ENCOUNTER — Ambulatory Visit: Payer: Medicare HMO | Admitting: Surgery

## 2021-11-29 ENCOUNTER — Ambulatory Visit (HOSPITAL_COMMUNITY)
Admission: RE | Admit: 2021-11-29 | Discharge: 2021-11-29 | Disposition: A | Discharge status: Home / Self Care | Payer: Medicare HMO | Source: Ambulatory Visit | Attending: Surgery | Admitting: Surgery

## 2021-11-29 DIAGNOSIS — I700 Atherosclerosis of aorta: Secondary | ICD-10-CM | POA: Diagnosis not present

## 2021-11-29 DIAGNOSIS — I739 Peripheral vascular disease, unspecified: Secondary | ICD-10-CM | POA: Insufficient documentation

## 2021-11-29 DIAGNOSIS — Z90710 Acquired absence of both cervix and uterus: Secondary | ICD-10-CM | POA: Diagnosis not present

## 2021-11-29 DIAGNOSIS — I2510 Atherosclerotic heart disease of native coronary artery without angina pectoris: Secondary | ICD-10-CM | POA: Diagnosis not present

## 2021-11-29 DIAGNOSIS — I745 Embolism and thrombosis of iliac artery: Secondary | ICD-10-CM | POA: Diagnosis not present

## 2021-11-29 DIAGNOSIS — I253 Aneurysm of heart: Secondary | ICD-10-CM | POA: Diagnosis not present

## 2021-11-29 DIAGNOSIS — I712 Thoracic aortic aneurysm, without rupture, unspecified: Secondary | ICD-10-CM | POA: Diagnosis not present

## 2021-11-29 MED ORDER — IOHEXOL 350 MG/ML SOLN
100.0000 mL | Freq: Once | INTRAVENOUS | Status: AC | PRN
  Administered 2021-11-29: 100 mL via INTRAVENOUS

## 2021-12-03 DIAGNOSIS — I739 Peripheral vascular disease, unspecified: Secondary | ICD-10-CM | POA: Diagnosis not present

## 2021-12-03 DIAGNOSIS — E785 Hyperlipidemia, unspecified: Secondary | ICD-10-CM | POA: Diagnosis not present

## 2021-12-06 ENCOUNTER — Ambulatory Visit (INDEPENDENT_AMBULATORY_CARE_PROVIDER_SITE_OTHER): Payer: Medicare HMO | Admitting: Surgery

## 2021-12-06 ENCOUNTER — Encounter: Payer: Self-pay | Admitting: Surgery

## 2021-12-06 ENCOUNTER — Ambulatory Visit: Payer: Medicare HMO | Admitting: Surgery

## 2021-12-06 VITALS — BP 156/72 | HR 70 | Temp 97.9°F | Resp 20 | Ht 65.0 in | Wt 125.9 lb

## 2021-12-06 DIAGNOSIS — I70229 Atherosclerosis of native arteries of extremities with rest pain, unspecified extremity: Secondary | ICD-10-CM

## 2021-12-06 NOTE — Progress Notes (Signed)
[facility] [place]  Patient name: Katherine Archer MRN: [id number] DOB: 31-Jan-1955 Sex: female   REASON FOR VISIT:    Follow up  HISOTRY OF PRESENT ILLNESS:    Katherine Archer is a 66 y.o. female who is status post right axillary bifemoral bypass graft by Dr. Oneida Alar on 11/11/2019 for rest pain in both feet.  Bilateral endarterectomies were also performed.  The patient has had sores on both feet for more than a year that come and go.  There is some stabbing pain in the right heel that is improved but still present.  She does not have rest pain that wakes her up at night.  She does not have claudication symptoms.  At the end of October 2023 she had a ultrasound that showed elevated velocities on the right side she was sent for CT scan.  She is back today for follow-up.  She is with her daughter who was the interpreter.  She continues to have bilateral foot pain.  This does appear to be stable.  Patient takes Pletal for claudication symptoms.  She is medically managed for hypertension.  She takes a statin for hypercholesterolemia.  She is a former smoker.   PAST MEDICAL HISTORY:   Past Medical History:  Diagnosis Date   Cervical polyp 09/18/2018   Critical lower limb ischemia (HCC)    Encounter for screening mammogram for breast cancer 05/05/2016   Fibrocystic breast changes of both breasts 04/29/2015   Hyperlipidemia    Hypertension    PAD (peripheral artery disease) ([place]) 08/23/2019   Stroke ([place]) 08/23/2019     FAMILY HISTORY:   Family History  Problem Relation Age of Onset   Heart Problems Father    Heart Problems Brother    Heart Problems Brother     SOCIAL HISTORY:   Social History   Tobacco Use   Smoking status: Former    Packs/day: 0.25    Types: Cigarettes    Quit date: 10/04/2019    Years since quitting: 2.1    Passive exposure: Never   Smokeless tobacco: Never  Substance Use Topics   Alcohol use: No      ALLERGIES:   Allergies  Allergen Reactions   Amoxicillin Other (See Comments)    Skin infection on face   Penicillins Other (See Comments)    Shaky and Dizziness 20 years ago     CURRENT MEDICATIONS:   Current Outpatient Medications  Medication Sig Dispense Refill   alendronate (FOSAMAX) 70 MG/75ML solution Take 70 mg by mouth once a week.     aspirin 81 MG EC tablet Take 81 mg by mouth daily.     cilostazol (PLETAL) 50 MG tablet Take 1 tablet (50 mg total) by mouth 2 (two) times daily. 180 tablet 3   ezetimibe (ZETIA) 10 MG tablet Take 10 mg by mouth daily.     hydrALAZINE (APRESOLINE) 50 MG tablet Take 1 tablet (50 mg total) by mouth 3 (three) times daily. 270 tablet 3   hydrochlorothiazide (HYDRODIURIL) 25 MG tablet Take 25 mg by mouth daily.     rosuvastatin (CRESTOR) 40 MG tablet Take 40 mg by mouth daily.     valsartan (DIOVAN) 80 MG tablet Take 80 mg by mouth daily.     No current facility-administered medications for this visit.    REVIEW OF SYSTEMS:   '[X]'$  denotes positive finding, '[ ]'$  denotes negative finding Cardiac  Comments:  Chest pain or chest pressure:    Shortness of  breath upon exertion:    Short of breath when lying flat:    Irregular heart rhythm:        Vascular    Pain in calf, thigh, or hip brought on by ambulation: x   Pain in feet at night that wakes you up from your sleep:  x   Blood clot in your veins:    Leg swelling:         Pulmonary    Oxygen at home:    Productive cough:     Wheezing:         Neurologic    Sudden weakness in arms or legs:     Sudden numbness in arms or legs:     Sudden onset of difficulty speaking or slurred speech:    Temporary loss of vision in one eye:     Problems with dizziness:         Gastrointestinal    Blood in stool:     Vomited blood:         Genitourinary    Burning when urinating:     Blood in urine:        Psychiatric    Major depression:         Hematologic    Bleeding problems:     Problems with blood clotting too easily:        Skin    Rashes or ulcers:        Constitutional    Fever or chills:      PHYSICAL EXAM:   Vitals:   12/06/21 1528  BP: (!) 156/72  Pulse: 70  Resp: 20  Temp: 97.9 F (36.6 C)  SpO2: 94%  Weight: 125 lb 14.4 oz (57.1 kg)  Height: '5\' 5"'$  (1.651 m)    GENERAL: The patient is a well-nourished female, in no acute distress. The vital signs are documented above. CARDIAC: There is a regular rate and rhythm.  VASCULAR: Nonpalpable pedal pulses PULMONARY: Non-labored respirations ABDOMEN: Soft and non-tender with normal pitched bowel sounds.  MUSCULOSKELETAL: There are no major deformities or cyanosis. NEUROLOGIC: No focal weakness or paresthesias are detected. SKIN: Several cysts on both feet PSYCHIATRIC: The patient has a normal affect.  STUDIES:   I reviewed her CT scan with the following findings:  VASCULAR   1. New occlusion of the right femoral bypass graft supplying a right profunda femoral artery. 2. Patent right axillary-femoral bypass graft. Patent femoral-femoral bypass graft. 3. Stable occlusive disease involving bilateral superficial femoral arteries. 4. Stable runoff in both lower extremities. Mild disease in the right ankle region as described. 5.  Aortic Atherosclerosis (ICD10-I70.0).   NON-VASCULAR   1. No acute abnormality in the abdomen or pelvis.   CTA chest: 1. No acute chest abnormality. 2. 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 3. Patent right axillary bypass graft. 4. Age-indeterminate compression fracture involving the T8 vertebral body. 5.  Aortic Atherosclerosis (ICD10-I70.0)  MEDICAL ISSUES:   PAD: The patient CT scan shows occlusion of right common femoral artery as documented in ultrasound.  The Ax Bi-FEM bypass remains patent.  Bilateral superficial femoral  arteries are occluded.  She does not have any active wounds however there are several areas on both feet that are concerning for potential wound breakdown however these have been chronic.  She does have pain in both feet which is probably multifactorial.  Because of her  pain and potential wound risk as well as outflow issues from her right asked by Devin Going, I had discussed the possibility of performing an outflow procedure on the right leg to see if this helps with her foot pain.  By her CT scan, this would be a an above-knee bypass.  It looks like on her CT scan that she is got a marginal vein so this will either be with prosthetic or with vein.  We talked about long-term patency issues as well as leg swelling and infection.  The patient wants to go talk with her family.  I am scheduling a phone visit in 2 weeks to finalize our plan    Annamarie Major, IV, MD, FACS [facility] [phone] Pager [phone]

## 2021-12-08 DIAGNOSIS — I69354 Hemiplegia and hemiparesis following cerebral infarction affecting left non-dominant side: Secondary | ICD-10-CM | POA: Diagnosis not present

## 2021-12-08 DIAGNOSIS — I10 Essential (primary) hypertension: Secondary | ICD-10-CM | POA: Diagnosis not present

## 2021-12-08 DIAGNOSIS — Z87891 Personal history of nicotine dependence: Secondary | ICD-10-CM | POA: Diagnosis not present

## 2021-12-08 DIAGNOSIS — Z950 Presence of cardiac pacemaker: Secondary | ICD-10-CM | POA: Diagnosis not present

## 2021-12-08 DIAGNOSIS — I700 Atherosclerosis of aorta: Secondary | ICD-10-CM | POA: Diagnosis not present

## 2021-12-08 DIAGNOSIS — E785 Hyperlipidemia, unspecified: Secondary | ICD-10-CM | POA: Diagnosis not present

## 2021-12-08 DIAGNOSIS — I70209 Unspecified atherosclerosis of native arteries of extremities, unspecified extremity: Secondary | ICD-10-CM | POA: Diagnosis not present

## 2021-12-08 DIAGNOSIS — Z8249 Family history of ischemic heart disease and other diseases of the circulatory system: Secondary | ICD-10-CM | POA: Diagnosis not present

## 2021-12-22 DIAGNOSIS — Z4689 Encounter for fitting and adjustment of other specified devices: Secondary | ICD-10-CM | POA: Diagnosis not present

## 2021-12-30 ENCOUNTER — Encounter: Payer: Self-pay | Admitting: Surgery

## 2021-12-30 ENCOUNTER — Ambulatory Visit (INDEPENDENT_AMBULATORY_CARE_PROVIDER_SITE_OTHER): Payer: Medicare HMO | Admitting: Surgery

## 2021-12-30 DIAGNOSIS — I70229 Atherosclerosis of native arteries of extremities with rest pain, unspecified extremity: Secondary | ICD-10-CM

## 2021-12-30 NOTE — H&P (View-Only) (Signed)
[facility] [place]  Patient name: Katherine Archer MRN: [id number] DOB: 09-Sep-1955 Sex: female      Virtual Visit via Telephone Note   Because of Katherine Archer's co-morbid illnesses, she is at least at moderate risk for complications without adequate follow up.  This format is felt to be most appropriate for this patient at this time.  The patient did not have access to video technology/had technical difficulties with video requiring transitioning to audio format only (telephone).  All issues noted in this document were discussed and addressed.  No physical exam could be performed with this format.    Patient Location: Home Provider Location: Office/Clinic    REASON FOR APPOINTMENT:    Follow-up  HISTORY OF PRESENT ILLNESS:   Katherine Archer is a 66 y.o. female, who is a former patient of Dr. Oneida Alar who is status post right axillary bifemoral bypass graft on 11/11/2019 for bilateral rest pain.  Bilateral femoral endarterectomies were also performed.  The patient has had wounds on both feet for more than a year that come and go.  This is associated with rest pain, right greater than left.  At the end of October 2023, duplex showed elevated velocities within the right side in the groin and so she was sent for a CT scan that confirmed common femoral occlusion with continued patency of her profundofemoral artery on the right.  She has bilateral superficial femoral artery occlusion.  When I saw her in December I discussed the possibility of performing redo femoral endarterectomy and femoral-popliteal bypass graft for symptomatic relief.  The patient wanted to go home and talk to her family.  We are having a phone visit today to discuss surgical options.  I spoke with her daughter who states that the patient has basically been in bed because of her pain.  The patient does take Crestor for hypercholesterolemia.  She is medically managed for hypertension.  She is also taking  Pletal.  She apparently has had bladder and uterine prolapse which is requiring frequent visits to GYN to keep things in place      PAST MEDICAL HISTORY    Past Medical History:  Diagnosis Date   Cervical polyp 09/18/2018   Critical lower limb ischemia ([place])    Encounter for screening mammogram for breast cancer 05/05/2016   Fibrocystic breast changes of both breasts 04/29/2015   Hyperlipidemia    Hypertension    PAD (peripheral artery disease) ([place]) 08/23/2019   Stroke ([place]) 08/23/2019     FAMILY HISTORY   Family History  Problem Relation Age of Onset   Heart Problems Father    Heart Problems Brother    Heart Problems Brother     SOCIAL HISTORY:   Social History   Socioeconomic History   Marital status: Divorced    Spouse name: Not on file   Number of children: Not on file   Years of education: Not on file   Highest education level: Not on file  Occupational History   Not on file  Tobacco Use   Smoking status: Former    Packs/day: 0.25    Types: Cigarettes    Quit date: 10/04/2019    Years since quitting: 2.2    Passive exposure: Never   Smokeless tobacco: Never  Vaping Use   Vaping Use: Never used  Substance and Sexual Activity   Alcohol use: No   Drug use: No   Sexual  activity: Not Currently  Other Topics Concern   Not on file  Social History Narrative   Not on file   Social Determinants of Health   Financial Resource Strain: Not on file  Food Insecurity: No Food Insecurity (06/29/2020)   Hunger Vital Sign    Worried About Running Out of Food in the Last Year: Never true    Ran Out of Food in the Last Year: Never true  Transportation Needs: No Transportation Needs (06/29/2020)   PRAPARE - Hydrologist (Medical): No    Lack of Transportation (Non-Medical): No  Physical Activity: Not on file  Stress: Not on file  Social Connections: Not on file  Intimate Partner Violence: Not on file    ALLERGIES:    Allergies   Allergen Reactions   Amoxicillin Other (See Comments)    Skin infection on face   Penicillins Other (See Comments)    Shaky and Dizziness 20 years ago    CURRENT MEDICATIONS:    Current Outpatient Medications  Medication Sig Dispense Refill   alendronate (FOSAMAX) 70 MG/75ML solution Take 70 mg by mouth once a week.     aspirin 81 MG EC tablet Take 81 mg by mouth daily.     cilostazol (PLETAL) 50 MG tablet Take 1 tablet (50 mg total) by mouth 2 (two) times daily. 180 tablet 3   ezetimibe (ZETIA) 10 MG tablet Take 10 mg by mouth daily.     hydrALAZINE (APRESOLINE) 50 MG tablet Take 1 tablet (50 mg total) by mouth 3 (three) times daily. 270 tablet 3   hydrochlorothiazide (HYDRODIURIL) 25 MG tablet Take 25 mg by mouth daily.     rosuvastatin (CRESTOR) 40 MG tablet Take 40 mg by mouth daily.     valsartan (DIOVAN) 80 MG tablet Take 80 mg by mouth daily.     No current facility-administered medications for this visit.    REVIEW OF SYSTEMS:   Please see the history of present illness.     All other systems reviewed and are negative.  PHYSICAL EXAM:     Recent Labs: 03/23/2021: Hemoglobin 14.2; Platelets 135   Recent Lipid Panel Lab Results  Component Value Date/Time   CHOL 118 11/12/2019 12:46 AM   TRIG 49 11/12/2019 12:46 AM   HDL 36 (L) 11/12/2019 12:46 AM   CHOLHDL 3.3 11/12/2019 12:46 AM   LDLCALC 72 11/12/2019 12:46 AM    Wt Readings from Last 3 Encounters:  12/06/21 125 lb 14.4 oz (57.1 kg)  11/01/21 124 lb 6.4 oz (56.4 kg)  08/25/21 124 lb 9.6 oz (56.5 kg)     STUDIES:   CT angiogram: VASCULAR   1. New occlusion of the right femoral bypass graft supplying a right profunda femoral artery. 2. Patent right axillary-femoral bypass graft. Patent femoral-femoral bypass graft. 3. Stable occlusive disease involving bilateral superficial femoral arteries. 4. Stable runoff in both lower extremities. Mild disease in the right ankle region as described. 5.   Aortic Atherosclerosis (ICD10-I70.0).   NON-VASCULAR   1. No acute abnormality in the abdomen or pelvis.  ASSESSMENT and PLAN   Bilateral wrist pain, right greater than left: Spoke with the daughter via telephone who acted as the interpreter.  She has had interval development of right femoral occlusion although her ax bifemoral bypass remains patent.  She has bilateral superficial femoral artery occlusion.  This is associated with chronic wounds.  For symptomatic relief, I have recommended redo right femoral endarterectomy with right femoral-popliteal  bypass graft.  Looking at her saphenous vein on CT scan it looks marginal.  It looks like I can go to the above-knee popliteal artery.  I discussed with the daughter that at some point in time, the patient will likely be facing amputation however bypass will hopefully delay this.  Will hopefully help get her out of rest pain and get the wounds on her legs to heal.  We discussed the risks of surgery, especially long-term patency and infection risks.  All of their questions were answered and they wish to proceed.  Again this will be a right femoral endarterectomy with femoral-popliteal bypass graft.  I am going to stop her cilostazol prior to surgery.   COVID  Time:   Today, I have spent 14 minutes with the patient with telehealth technology discussing the above problems.      Leia Alf, MD, FACS [facility] [phone] Pager [phone]

## 2021-12-30 NOTE — Progress Notes (Signed)
[facility] [place]  Patient name: Katherine Archer MRN: [id number] DOB: Sep 02, 1955 Sex: female      Virtual Visit via Telephone Note   Because of Katherine Archer's co-morbid illnesses, she is at least at moderate risk for complications without adequate follow up.  This format is felt to be most appropriate for this patient at this time.  The patient did not have access to video technology/had technical difficulties with video requiring transitioning to audio format only (telephone).  All issues noted in this document were discussed and addressed.  No physical exam could be performed with this format.    Patient Location: Home Provider Location: Office/Clinic    REASON FOR APPOINTMENT:    Follow-up  HISTORY OF PRESENT ILLNESS:   Katherine Archer is a 66 y.o. female, who is a former patient of Dr. Oneida Alar who is status post right axillary bifemoral bypass graft on 11/11/2019 for bilateral rest pain.  Bilateral femoral endarterectomies were also performed.  The patient has had wounds on both feet for more than a year that come and go.  This is associated with rest pain, right greater than left.  At the end of October 2023, duplex showed elevated velocities within the right side in the groin and so she was sent for a CT scan that confirmed common femoral occlusion with continued patency of her profundofemoral artery on the right.  She has bilateral superficial femoral artery occlusion.  When I saw her in December I discussed the possibility of performing redo femoral endarterectomy and femoral-popliteal bypass graft for symptomatic relief.  The patient wanted to go home and talk to her family.  We are having a phone visit today to discuss surgical options.  I spoke with her daughter who states that the patient has basically been in bed because of her pain.  The patient does take Crestor for hypercholesterolemia.  She is medically managed for hypertension.  She is also taking  Pletal.  She apparently has had bladder and uterine prolapse which is requiring frequent visits to GYN to keep things in place      PAST MEDICAL HISTORY    Past Medical History:  Diagnosis Date   Cervical polyp 09/18/2018   Critical lower limb ischemia ([place])    Encounter for screening mammogram for breast cancer 05/05/2016   Fibrocystic breast changes of both breasts 04/29/2015   Hyperlipidemia    Hypertension    PAD (peripheral artery disease) ([place]) 08/23/2019   Stroke ([place]) 08/23/2019     FAMILY HISTORY   Family History  Problem Relation Age of Onset   Heart Problems Father    Heart Problems Brother    Heart Problems Brother     SOCIAL HISTORY:   Social History   Socioeconomic History   Marital status: Divorced    Spouse name: Not on file   Number of children: Not on file   Years of education: Not on file   Highest education level: Not on file  Occupational History   Not on file  Tobacco Use   Smoking status: Former    Packs/day: 0.25    Types: Cigarettes    Quit date: 10/04/2019    Years since quitting: 2.2    Passive exposure: Never   Smokeless tobacco: Never  Vaping Use   Vaping Use: Never used  Substance and Sexual Activity   Alcohol use: No   Drug use: No   Sexual  activity: Not Currently  Other Topics Concern   Not on file  Social History Narrative   Not on file   Social Determinants of Health   Financial Resource Strain: Not on file  Food Insecurity: No Food Insecurity (06/29/2020)   Hunger Vital Sign    Worried About Running Out of Food in the Last Year: Never true    Ran Out of Food in the Last Year: Never true  Transportation Needs: No Transportation Needs (06/29/2020)   PRAPARE - Hydrologist (Medical): No    Lack of Transportation (Non-Medical): No  Physical Activity: Not on file  Stress: Not on file  Social Connections: Not on file  Intimate Partner Violence: Not on file    ALLERGIES:    Allergies   Allergen Reactions   Amoxicillin Other (See Comments)    Skin infection on face   Penicillins Other (See Comments)    Shaky and Dizziness 20 years ago    CURRENT MEDICATIONS:    Current Outpatient Medications  Medication Sig Dispense Refill   alendronate (FOSAMAX) 70 MG/75ML solution Take 70 mg by mouth once a week.     aspirin 81 MG EC tablet Take 81 mg by mouth daily.     cilostazol (PLETAL) 50 MG tablet Take 1 tablet (50 mg total) by mouth 2 (two) times daily. 180 tablet 3   ezetimibe (ZETIA) 10 MG tablet Take 10 mg by mouth daily.     hydrALAZINE (APRESOLINE) 50 MG tablet Take 1 tablet (50 mg total) by mouth 3 (three) times daily. 270 tablet 3   hydrochlorothiazide (HYDRODIURIL) 25 MG tablet Take 25 mg by mouth daily.     rosuvastatin (CRESTOR) 40 MG tablet Take 40 mg by mouth daily.     valsartan (DIOVAN) 80 MG tablet Take 80 mg by mouth daily.     No current facility-administered medications for this visit.    REVIEW OF SYSTEMS:   Please see the history of present illness.     All other systems reviewed and are negative.  PHYSICAL EXAM:     Recent Labs: 03/23/2021: Hemoglobin 14.2; Platelets 135   Recent Lipid Panel Lab Results  Component Value Date/Time   CHOL 118 11/12/2019 12:46 AM   TRIG 49 11/12/2019 12:46 AM   HDL 36 (L) 11/12/2019 12:46 AM   CHOLHDL 3.3 11/12/2019 12:46 AM   LDLCALC 72 11/12/2019 12:46 AM    Wt Readings from Last 3 Encounters:  12/06/21 125 lb 14.4 oz (57.1 kg)  11/01/21 124 lb 6.4 oz (56.4 kg)  08/25/21 124 lb 9.6 oz (56.5 kg)     STUDIES:   CT angiogram: VASCULAR   1. New occlusion of the right femoral bypass graft supplying a right profunda femoral artery. 2. Patent right axillary-femoral bypass graft. Patent femoral-femoral bypass graft. 3. Stable occlusive disease involving bilateral superficial femoral arteries. 4. Stable runoff in both lower extremities. Mild disease in the right ankle region as described. 5.   Aortic Atherosclerosis (ICD10-I70.0).   NON-VASCULAR   1. No acute abnormality in the abdomen or pelvis.  ASSESSMENT and PLAN   Bilateral wrist pain, right greater than left: Spoke with the daughter via telephone who acted as the interpreter.  She has had interval development of right femoral occlusion although her ax bifemoral bypass remains patent.  She has bilateral superficial femoral artery occlusion.  This is associated with chronic wounds.  For symptomatic relief, I have recommended redo right femoral endarterectomy with right femoral-popliteal  bypass graft.  Looking at her saphenous vein on CT scan it looks marginal.  It looks like I can go to the above-knee popliteal artery.  I discussed with the daughter that at some point in time, the patient will likely be facing amputation however bypass will hopefully delay this.  Will hopefully help get her out of rest pain and get the wounds on her legs to heal.  We discussed the risks of surgery, especially long-term patency and infection risks.  All of their questions were answered and they wish to proceed.  Again this will be a right femoral endarterectomy with femoral-popliteal bypass graft.  I am going to stop her cilostazol prior to surgery.   COVID  Time:   Today, I have spent 14 minutes with the patient with telehealth technology discussing the above problems.      Leia Alf, MD, FACS [facility] [phone] Pager [phone]

## 2022-01-03 DIAGNOSIS — E785 Hyperlipidemia, unspecified: Secondary | ICD-10-CM | POA: Diagnosis not present

## 2022-01-03 DIAGNOSIS — I739 Peripheral vascular disease, unspecified: Secondary | ICD-10-CM | POA: Diagnosis not present

## 2022-01-04 ENCOUNTER — Other Ambulatory Visit: Payer: Self-pay

## 2022-01-04 DIAGNOSIS — I70229 Atherosclerosis of native arteries of extremities with rest pain, unspecified extremity: Secondary | ICD-10-CM

## 2022-01-05 DIAGNOSIS — I10 Essential (primary) hypertension: Secondary | ICD-10-CM | POA: Diagnosis not present

## 2022-01-05 DIAGNOSIS — E785 Hyperlipidemia, unspecified: Secondary | ICD-10-CM | POA: Diagnosis not present

## 2022-01-14 ENCOUNTER — Telehealth: Payer: Self-pay

## 2022-01-14 NOTE — Telephone Encounter (Signed)
Spoke with patient's daughter, [name] regarding the need to change surgery date from 02/04/22 due to provider schedule change. [name] agreed to reschedule surgery to 01/26/22. Instructions reviewed and advised to stop Pletal 5 days prior, last dose on 01/20/22. She verbalized understanding.

## 2022-01-21 DIAGNOSIS — Z6820 Body mass index (BMI) 20.0-20.9, adult: Secondary | ICD-10-CM | POA: Diagnosis not present

## 2022-01-21 DIAGNOSIS — I10 Essential (primary) hypertension: Secondary | ICD-10-CM | POA: Diagnosis not present

## 2022-01-21 DIAGNOSIS — Z1331 Encounter for screening for depression: Secondary | ICD-10-CM | POA: Diagnosis not present

## 2022-01-21 DIAGNOSIS — E785 Hyperlipidemia, unspecified: Secondary | ICD-10-CM | POA: Diagnosis not present

## 2022-01-21 DIAGNOSIS — Z139 Encounter for screening, unspecified: Secondary | ICD-10-CM | POA: Diagnosis not present

## 2022-01-21 NOTE — Pre-Procedure Instructions (Signed)
Surgical Instructions    Your procedure is scheduled on Wednesday, January 24th.  Report to [facility] Main Entrance "A" at 5:30 A.M., then check in with the Admitting office.  Call this number if you have problems the morning of surgery:  [phone]   If you have any questions prior to your surgery date call [phone]: Open Monday-Friday 8am-4pm If you experience any cold or flu symptoms such as cough, fever, chills, shortness of breath, etc. between now and your scheduled surgery, please notify us at the above number     Remember:  Do not eat or drink after midnight the night before your surgery   Take these medicines the morning of surgery with A SIP OF WATER:  aspirin 81  ezetimibe (ZETIA)  hydrALAZINE (APRESOLINE)  rosuvastatin (CRESTOR)   Per your surgeon, STOP cilostazol (PLETAL) 5 days prior to surgeon. Last dose will be Thursday, January 18th.      As of today, STOP taking any Aspirin (unless otherwise instructed by your surgeon) Aleve, Naproxen, Ibuprofen, Motrin, Advil, Goody's, BC's, all herbal medications, fish oil, and all vitamins.           Do not wear jewelry or makeup. Do not wear lotions, powders, perfumes or deodorant. Do not shave 48 hours prior to surgery.   Do not bring valuables to the hospital. Do not wear nail polish, gel polish, artificial nails, or any other type of covering on natural nails (fingers and toes) If you have artificial nails or gel coating that need to be removed by a nail salon, please have this removed prior to surgery. Artificial nails or gel coating may interfere with anesthesia's ability to adequately monitor your vital signs.  [facility] is not responsible for any belongings or valuables.    Do NOT Smoke (Tobacco/Vaping)  24 hours prior to your procedure  If you use a CPAP at night, you may bring your mask for your overnight stay.   Contacts, glasses, hearing aids, dentures or partials may not be worn into surgery, please  bring cases for these belongings   For patients admitted to the hospital, discharge time will be determined by your treatment team.   Patients discharged the day of surgery will not be allowed to drive home, and someone needs to stay with them for 24 hours.   SURGICAL WAITING ROOM VISITATION Patients having surgery or a procedure may have no more than 2 support people in the waiting area - these visitors may rotate.   Children under the age of 60 must have an adult with them who is not the patient. If the patient needs to stay at the hospital during part of their recovery, the visitor guidelines for inpatient rooms apply. Pre-op nurse will coordinate an appropriate time for 1 support person to accompany patient in pre-op.  This support person may not rotate.   Please refer to [email or web address] for the visitor guidelines for Inpatients (after your surgery is over and you are in a regular room).    Special instructions:    Oral Hygiene is also important to reduce your risk of infection.  Remember - BRUSH YOUR TEETH THE MORNING OF SURGERY WITH YOUR REGULAR TOOTHPASTE   [facility]- Preparing For Surgery  Before surgery, you can play an important role. Because skin is not sterile, your skin needs to be as free of germs as possible. You can reduce the number of germs on your skin by washing with CHG (chlorahexidine gluconate) Soap before surgery.  CHG is  an antiseptic cleaner which kills germs and bonds with the skin to continue killing germs even after washing.     Please do not use if you have an allergy to CHG or antibacterial soaps. If your skin becomes reddened/irritated stop using the CHG.  Do not shave (including legs and underarms) for at least 48 hours prior to first CHG shower. It is OK to shave your face.  Please follow these instructions carefully.     Shower the NIGHT BEFORE SURGERY and the MORNING OF SURGERY with CHG Soap.    If you chose to wash your hair, wash your hair first as usual with your normal shampoo. After you shampoo, rinse your hair and body thoroughly to remove the shampoo.  Then [employer] and genitals (private parts) with your normal soap and rinse thoroughly to remove soap.  After that Use CHG Soap as you would any other liquid soap. You can apply CHG directly to the skin and wash gently with a scrungie or a clean washcloth.   Apply the CHG Soap to your body ONLY FROM THE NECK DOWN.  Do not use on open wounds or open sores. Avoid contact with your eyes, ears, mouth and genitals (private parts). Wash Face and genitals (private parts)  with your normal soap.   Wash thoroughly, paying special attention to the area where your surgery will be performed.  Thoroughly rinse your body with warm water from the neck down.  DO NOT shower/wash with your normal soap after using and rinsing off the CHG Soap.  Pat yourself dry with a CLEAN TOWEL.  Wear CLEAN PAJAMAS to bed the night before surgery  Place CLEAN SHEETS on your bed the night before your surgery  DO NOT SLEEP WITH PETS.   Day of Surgery:  Take a shower with CHG soap. Wear Clean/Comfortable clothing the morning of surgery Do not apply any deodorants/lotions.   Remember to brush your teeth WITH YOUR REGULAR TOOTHPASTE.    If you received a COVID test during your pre-op visit, it is requested that you wear a mask when out in public, stay away from anyone that may not be feeling well, and notify your surgeon if you develop symptoms. If you have been in contact with anyone that has tested positive in the last 10 days, please notify your surgeon.    Please read over the following fact sheets that you were given.

## 2022-01-24 ENCOUNTER — Encounter (HOSPITAL_COMMUNITY)
Admission: RE | Admit: 2022-01-24 | Discharge: 2022-01-24 | Disposition: A | Discharge status: Home / Self Care | Payer: Medicare HMO | Source: Ambulatory Visit | Attending: Surgery | Admitting: Surgery

## 2022-01-24 ENCOUNTER — Encounter (HOSPITAL_COMMUNITY): Payer: Self-pay

## 2022-01-24 ENCOUNTER — Other Ambulatory Visit: Payer: Self-pay

## 2022-01-24 VITALS — BP 165/72 | HR 60 | Temp 97.6°F | Resp 17 | Ht 66.0 in | Wt 124.6 lb

## 2022-01-24 DIAGNOSIS — I70229 Atherosclerosis of native arteries of extremities with rest pain, unspecified extremity: Secondary | ICD-10-CM | POA: Insufficient documentation

## 2022-01-24 DIAGNOSIS — I700 Atherosclerosis of aorta: Secondary | ICD-10-CM | POA: Insufficient documentation

## 2022-01-24 DIAGNOSIS — R7989 Other specified abnormal findings of blood chemistry: Secondary | ICD-10-CM | POA: Insufficient documentation

## 2022-01-24 DIAGNOSIS — Z8673 Personal history of transient ischemic attack (TIA), and cerebral infarction without residual deficits: Secondary | ICD-10-CM | POA: Insufficient documentation

## 2022-01-24 DIAGNOSIS — Z01818 Encounter for other preprocedural examination: Secondary | ICD-10-CM | POA: Insufficient documentation

## 2022-01-24 DIAGNOSIS — I2510 Atherosclerotic heart disease of native coronary artery without angina pectoris: Secondary | ICD-10-CM | POA: Insufficient documentation

## 2022-01-24 DIAGNOSIS — I10 Essential (primary) hypertension: Secondary | ICD-10-CM | POA: Insufficient documentation

## 2022-01-24 DIAGNOSIS — Z7982 Long term (current) use of aspirin: Secondary | ICD-10-CM | POA: Insufficient documentation

## 2022-01-24 DIAGNOSIS — Z79899 Other long term (current) drug therapy: Secondary | ICD-10-CM | POA: Insufficient documentation

## 2022-01-24 LAB — CBC
HCT: 45.5 % (ref 36.0–46.0)
Hemoglobin: 14.8 g/dL (ref 12.0–15.0)
MCH: 32 pg (ref 26.0–34.0)
MCHC: 32.5 g/dL (ref 30.0–36.0)
MCV: 98.3 fL (ref 80.0–100.0)
Platelets: 114 10*3/uL — ABNORMAL LOW (ref 150–400)
RBC: 4.63 MIL/uL (ref 3.87–5.11)
RDW: 12.6 % (ref 11.5–15.5)
WBC: 5.9 10*3/uL (ref 4.0–10.5)
nRBC: 0 % (ref 0.0–0.2)

## 2022-01-24 LAB — TYPE AND SCREEN
ABO/RH(D): O POS
Antibody Screen: NEGATIVE

## 2022-01-24 LAB — URINALYSIS, ROUTINE W REFLEX MICROSCOPIC
Bilirubin Urine: NEGATIVE
Glucose, UA: NEGATIVE mg/dL
Hgb urine dipstick: NEGATIVE
Ketones, ur: NEGATIVE mg/dL
Leukocytes,Ua: NEGATIVE
Nitrite: NEGATIVE
Protein, ur: NEGATIVE mg/dL
Specific Gravity, Urine: 1.01 (ref 1.005–1.030)
pH: 6 (ref 5.0–8.0)

## 2022-01-24 LAB — COMPREHENSIVE METABOLIC PANEL
ALT: 17 U/L (ref 0–44)
AST: 20 U/L (ref 15–41)
Albumin: 4.4 g/dL (ref 3.5–5.0)
Alkaline Phosphatase: 83 U/L (ref 38–126)
Anion gap: 9 (ref 5–15)
BUN: 9 mg/dL (ref 8–23)
CO2: 24 mmol/L (ref 22–32)
Calcium: 9.5 mg/dL (ref 8.9–10.3)
Chloride: 107 mmol/L (ref 98–111)
Creatinine, Ser: 0.82 mg/dL (ref 0.44–1.00)
GFR, Estimated: >60 mL/min (ref >60)
Glucose, Bld: 90 mg/dL (ref 70–99)
Potassium: 4.3 mmol/L (ref 3.5–5.1)
Sodium: 140 mmol/L (ref 135–145)
Total Bilirubin: 0.6 mg/dL (ref 0.3–1.2)
Total Protein: 7.9 g/dL (ref 6.5–8.1)

## 2022-01-24 LAB — PROTIME-INR
INR: 1 (ref 0.8–1.2)
Prothrombin Time: 13.1 seconds (ref 11.4–15.2)

## 2022-01-24 LAB — SURGICAL PCR SCREEN
MRSA, PCR: NEGATIVE
Staphylococcus aureus: NEGATIVE

## 2022-01-24 LAB — APTT: aPTT: 28 seconds (ref 24–36)

## 2022-01-24 NOTE — Progress Notes (Signed)
PCP - Dr.Francisco Nyra Capes Cardiologist - Dr.Kardie Tobb  PPM/ICD - pt denies Device Orders - n/a Rep Notified - n/a  Chest x-ray - n/a EKG - 03/23/21 Stress Test - pt denies ECHO - 11/12/19 Cardiac Cath - pt denies.  Peripheral Vascular catheterization on 08/23/19  Sleep Study - pt denies CPAP - n/a  Fasting Blood Sugar - pt denies diabetes  Checks Blood Sugar _____ times a day  Last dose of GLP1 agonist-  pt denies GLP1 instructions: n/a  Blood Thinner Instructions:Per your surgeon, STOP cilostazol (PLETAL) 5 days prior to surgeon. Last dose will be Thursday, January 18th.     patient states she is not taking cilostazol (Pletal)  Aspirin Instructions Follow your surgeon's instructions on when to stop Aspirin.  If no instructions were given by your surgeon then you will need to call the office to get those instructions.     ERAS Protcol -NPO  PRE-SURGERY Ensure or G2- none ordered   COVID TEST- n/a   Anesthesia review: YES. PVD   Patient denies shortness of breath, fever, cough and chest pain at PAT appointment . Marta Col-Spanish interpreter at pt's side during PAT visit today.   All instructions explained to the patient, with a verbal understanding of the material. Patient agrees to go over the instructions while at home for a better understanding. Patient also instructed to self quarantine after being tested for COVID-19. The opportunity to ask questions was provided.

## 2022-01-25 NOTE — Progress Notes (Signed)
Anesthesia Chart Review:  Case: [id number] Date/Time: 01/26/22 0715   Procedures:      REDO RIGHT COMMON FEMORAL ENDARTERECTOMY (Right)     RIGHT FEMORAL-POPLITEAL BYPASS GRAFT (Right)   Anesthesia type: General   Pre-op diagnosis: Atherosclerotic peripheral vascular disease with rest pain   Location: [facility] OR ROOM 16 / [place] [facility]   Surgeons: Serafina Mitchell, MD       DISCUSSION: Patient is a 67 year old female scheduled for the above procedure. Needs Spanish interpretor.   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   Per 11/29/21 CTA her Ax-Fem bypass remained patient, but she had new occlusion of her right CFA and had bilateral SFA occlusion. She has had claudication and chronic food wounds. Redo right CFA endarterectomy with right FPBG recommended.   Last cardiology visit with Dr. Harriet Masson was on 07/23/21. No angina. Continue medical therapy for CAD. One year follow-up recommended.   Anesthesia team to evaluate on the day of surgery. Last Pletal 01/20/22.   VS: BP (!) 165/72   Pulse 60   Temp 36.4 C   Resp 17   Ht '5\' 6"'$  (1.676 m)   Wt 56.5 kg   SpO2 100%   BMI 20.11 kg/m    PROVIDERS: [name], MD is PCP Berniece Salines, DO is cardiologist  Antony Contras, MD is neurologist  Luanne Bras, MD is IR (cerebral aneurysm). Last evaluation noted is from 03/29/21 Conservative management versus intervention discussed. Notes suggest patient interested in intervention, but would require input from vascular surgery as well given her prior vascular bypass.  Brabham, V. Rock Nephew, MD is vascular surgeon   LABS: Labs reviewed: Acceptable for  surgery. (all labs ordered are listed, but only abnormal results are displayed)  Labs Reviewed  CBC - Abnormal; Notable for the following components:      Result Value   Platelets 114 (*)    All other components within normal limits  SURGICAL PCR SCREEN  COMPREHENSIVE METABOLIC PANEL  PROTIME-INR  APTT  URINALYSIS, ROUTINE W REFLEX MICROSCOPIC  TYPE AND SCREEN     IMAGES: CTA Ao+BiFem 11/29/21: IMPRESSION: VASCULAR  1. New occlusion of the right femoral bypass graft supplying a right profunda femoral artery. 2. Patent right axillary-femoral bypass graft. Patent femoral-femoral bypass graft. 3. Stable occlusive disease involving bilateral superficial femoral arteries. 4. Stable runoff in both lower extremities. Mild disease in the right ankle region as described. 5.  Aortic Atherosclerosis (ICD10-I70.0).   NON-VASCULAR 1. No acute abnormality in the abdomen or pelvis.    EKG: EKG 03/23/21 ([facility]):  Sinus bradycardia at 54 bpm ST and Marked T wave abnormality, consider anterolateral ischemia Prolonged QT (QT 496 ms, QTc 470 ms)   CV: US Carotid 01/26/21: Summary:  Right Carotid: Velocities in the right ICA are consistent with a 1-39%  stenosis.  Left Carotid: Velocities in the left ICA are consistent with a 1-39%  stenosis.  Vertebrals: Left vertebral artery demonstrates antegrade flow. Right  vertebral              artery was not visualized.  Subclavians: Right subclavian artery was stenotic. Normal flow  hemodynamics were  seen in the left subclavian artery.    Echo 11/12/19: IMPRESSIONS   1. Left ventricular ejection fraction, by estimation, is 60 to 65%. The  left ventricle has normal function. The left ventricle has no regional  wall motion abnormalities. There is mild concentric left ventricular  hypertrophy. Left ventricular diastolic  parameters are consistent with Grade II diastolic dysfunction  (pseudonormalization). Elevated  left atrial pressure.   2. Right ventricular systolic function is normal. The right ventricular  size is normal. There is mildly elevated pulmonary artery systolic  pressure. The estimated right ventricular systolic pressure is 24.8 mmHg.   3. The mitral valve is normal in structure. No evidence of mitral valve  regurgitation. No evidence of mitral stenosis.   4. The aortic valve is normal in structure. Aortic valve regurgitation is  not visualized. No aortic stenosis is present.   5. The inferior vena cava is dilated in size with <50% respiratory  variability, suggesting right atrial pressure of 15 mmHg.     CT Coronary 09/25/19: IMPRESSION: 1. Coronary calcium score of 13. This was 18 percentile for age and sex matched control.   2. Normal coronary origin with right dominance.   3. Minimal CAD. CADRADs 1. Recommend medical therapy for primary prevention. This study will be send for FFR due the RCA not well visualized in the mid and distal portion for any flow limiting lesions.  FFR 09/25/19:  1. Left Main: 0.96  2. LAD: Proximal 0.96,  Mid 0.92, Distal 0.86 3. LCX: Proximal 0.92,  Mid 0.78, Distal 0.78  4. RCA: Proximal 0.97,  Mid 0.88, distal 0.82   IMPRESSION: The FFRct result as list above with LCX 0.78 in the mid and distal portion. This is a small vessel, recommend optimizing medical therapy prior to consideration of further testing with cardiac catheterization.  Past Medical History:  Diagnosis Date   Cervical polyp 09/18/2018   Critical lower limb ischemia (HCC)    Encounter for screening mammogram for breast cancer 05/05/2016   Fibrocystic breast changes of both breasts 04/29/2015   Hyperlipidemia    Hypertension    PAD (peripheral artery disease) ([place]) 08/23/2019   Stroke ([place]) 08/23/2019    Past Surgical History:  Procedure Laterality Date   ABDOMINAL AORTOGRAM W/LOWER EXTREMITY Bilateral 08/23/2019   Procedure: ABDOMINAL AORTOGRAM W/ Bilateral LOWER EXTREMITY  Runoff;  Surgeon: [name], MD;  Location: [facility];  Service: Cardiovascular;  Laterality: Bilateral;   AXILLARY-FEMORAL BYPASS GRAFT Right 11/11/2019   Procedure: BYPASS GRAFT RIGHT AXILLA-BIFEMORAL;  Surgeon: [name], MD;  Location: [facility];  Service: Vascular;  Laterality: Right;   BRAIN SURGERY     BREAST SURGERY Bilateral 2014   1980 R breast surgery   CEREBRAL ANEURYSM REPAIR     ENDARTERECTOMY FEMORAL Bilateral 11/11/2019   Procedure: ENDARTERECTOMY FEMORAL BILATERALLY;  Surgeon: [name], MD;  Location: [facility];  Service: Vascular;  Laterality: Bilateral;   IR RADIOLOGIST EVAL & MGMT  04/02/2020   IR RADIOLOGIST EVAL & MGMT  03/29/2021   TUBAL LIGATION  1990    MEDICATIONS:  alendronate (FOSAMAX) 70 MG tablet   aspirin 81 MG EC tablet   cilostazol (PLETAL) 50 MG tablet   ezetimibe (ZETIA) 10 MG tablet   hydrALAZINE (APRESOLINE) 50 MG tablet   hydrochlorothiazide (HYDRODIURIL) 25 MG tablet   rosuvastatin (CRESTOR) 40 MG tablet   valsartan (DIOVAN) 80 MG tablet   No current facility-administered medications for this encounter.    Myra Gianotti,  PA-C Surgical Short Stay/Anesthesiology [facility] Phone [phone] [facility] Phone [phone] 01/25/2022 10:29 AM

## 2022-01-25 NOTE — Anesthesia Preprocedure Evaluation (Addendum)
Anesthesia Evaluation  Patient identified by MRN, date of birth, ID band Patient awake    Reviewed: Allergy & Precautions, H&P , NPO status , Patient's Chart, lab work & pertinent test results  Airway Mallampati: II  TM Distance: >3 FB Neck ROM: Full    Dental no notable dental hx. (+) Edentulous Upper, Edentulous Lower, Dental Advisory Given   Pulmonary neg pulmonary ROS, former smoker   Pulmonary exam normal breath sounds clear to auscultation       Cardiovascular Exercise Tolerance: Good hypertension, Pt. on medications + CAD and + Peripheral Vascular Disease   Rhythm:Regular Rate:Normal     Neuro/Psych CVA  negative psych ROS   GI/Hepatic negative GI ROS, Neg liver ROS,,,  Endo/Other  negative endocrine ROS    Renal/GU negative Renal ROS  negative genitourinary   Musculoskeletal   Abdominal   Peds  Hematology negative hematology ROS (+)   Anesthesia Other Findings   Reproductive/Obstetrics negative OB ROS                             Anesthesia Physical Anesthesia Plan  ASA: 3  Anesthesia Plan: General   Post-op Pain Management: Tylenol PO (pre-op)*   Induction: Intravenous  PONV Risk Score and Plan: 4 or greater and Ondansetron, Dexamethasone, Midazolam and Treatment may vary due to age or medical condition  Airway Management Planned: Oral ETT  Additional Equipment: Arterial line  Intra-op Plan:   Post-operative Plan: Extubation in OR  Informed Consent: I have reviewed the patients History and Physical, chart, labs and discussed the procedure including the risks, benefits and alternatives for the proposed anesthesia with the patient or authorized representative who has indicated his/her understanding and acceptance.     Dental advisory given  Plan Discussed with: CRNA  Anesthesia Plan Comments: (PAT note written 01/25/2022 by Myra Gianotti, PA-C.  )        Anesthesia Quick Evaluation

## 2022-01-26 ENCOUNTER — Inpatient Hospital Stay (HOSPITAL_COMMUNITY): Payer: Medicare HMO | Admitting: Vascular Surgery

## 2022-01-26 ENCOUNTER — Inpatient Hospital Stay (HOSPITAL_COMMUNITY)
Admission: RE | Admit: 2022-01-26 | Discharge: 2022-01-28 | DRG: 254 | Disposition: A | Discharge status: Home Health | Payer: Medicare HMO | Attending: Surgery | Admitting: Surgery

## 2022-01-26 ENCOUNTER — Encounter (HOSPITAL_COMMUNITY)
Admission: RE | Disposition: A | Discharge status: Home / Self Care | Payer: Self-pay | Source: Home / Self Care | Attending: Surgery

## 2022-01-26 ENCOUNTER — Other Ambulatory Visit: Payer: Self-pay

## 2022-01-26 ENCOUNTER — Encounter (HOSPITAL_COMMUNITY): Payer: Self-pay | Admitting: Surgery

## 2022-01-26 ENCOUNTER — Inpatient Hospital Stay (HOSPITAL_COMMUNITY): Payer: Medicare HMO | Admitting: Certified Registered"

## 2022-01-26 DIAGNOSIS — Z8673 Personal history of transient ischemic attack (TIA), and cerebral infarction without residual deficits: Secondary | ICD-10-CM

## 2022-01-26 DIAGNOSIS — Z7982 Long term (current) use of aspirin: Secondary | ICD-10-CM | POA: Diagnosis not present

## 2022-01-26 DIAGNOSIS — L97519 Non-pressure chronic ulcer of other part of right foot with unspecified severity: Secondary | ICD-10-CM

## 2022-01-26 DIAGNOSIS — Z87891 Personal history of nicotine dependence: Secondary | ICD-10-CM

## 2022-01-26 DIAGNOSIS — I10 Essential (primary) hypertension: Secondary | ICD-10-CM

## 2022-01-26 DIAGNOSIS — I2510 Atherosclerotic heart disease of native coronary artery without angina pectoris: Secondary | ICD-10-CM | POA: Diagnosis present

## 2022-01-26 DIAGNOSIS — Z8249 Family history of ischemic heart disease and other diseases of the circulatory system: Secondary | ICD-10-CM | POA: Diagnosis not present

## 2022-01-26 DIAGNOSIS — E7800 Pure hypercholesterolemia, unspecified: Secondary | ICD-10-CM | POA: Diagnosis not present

## 2022-01-26 DIAGNOSIS — Z880 Allergy status to penicillin: Secondary | ICD-10-CM | POA: Diagnosis not present

## 2022-01-26 DIAGNOSIS — Z7983 Long term (current) use of bisphosphonates: Secondary | ICD-10-CM

## 2022-01-26 DIAGNOSIS — I70235 Atherosclerosis of native arteries of right leg with ulceration of other part of foot: Secondary | ICD-10-CM | POA: Diagnosis not present

## 2022-01-26 DIAGNOSIS — I70221 Atherosclerosis of native arteries of extremities with rest pain, right leg: Secondary | ICD-10-CM

## 2022-01-26 HISTORY — PX: FEMORAL-POPLITEAL BYPASS GRAFT: SHX937

## 2022-01-26 HISTORY — PX: ENDARTERECTOMY FEMORAL: SHX5804

## 2022-01-26 HISTORY — DX: Atherosclerosis of native arteries of extremities with rest pain, right leg: I70.221

## 2022-01-26 HISTORY — PX: THROMBECTOMY FEMORAL ARTERY: SHX6406

## 2022-01-26 SURGERY — ENDARTERECTOMY, FEMORAL
Anesthesia: General | Site: Leg Upper | Laterality: Right

## 2022-01-26 MED ORDER — ACETAMINOPHEN 325 MG PO TABS: 325.0000 mg | ORAL_TABLET | ORAL | Status: DC | PRN

## 2022-01-26 MED ORDER — LACTATED RINGERS IV SOLN: INTRAVENOUS | Status: DC | PRN

## 2022-01-26 MED ORDER — PHENOL 1.4 % MT LIQD: 1.0000 | OROMUCOSAL | Status: DC | PRN

## 2022-01-26 MED ORDER — SODIUM CHLORIDE 0.9 % IV SOLN: 500.0000 mL | Freq: Once | INTRAVENOUS | Status: DC | PRN

## 2022-01-26 MED ORDER — ASPIRIN 81 MG PO TBEC
81.0000 mg | DELAYED_RELEASE_TABLET | Freq: Every day | ORAL | Status: DC
  Administered 2022-01-26 – 2022-01-28 (×3): 81 mg via ORAL
  Filled 2022-01-26 (×3): qty 1

## 2022-01-26 MED ORDER — ONDANSETRON HCL 4 MG/2ML IJ SOLN: 4.0000 mg | Freq: Four times a day (QID) | INTRAMUSCULAR | Status: DC | PRN

## 2022-01-26 MED ORDER — PHENYLEPHRINE HCL-NACL 20-0.9 MG/250ML-% IV SOLN
INTRAVENOUS | Status: DC | PRN
  Administered 2022-01-26: 80 ug via INTRAVENOUS
  Administered 2022-01-26: 40 ug/min via INTRAVENOUS
  Administered 2022-01-26: 80 ug via INTRAVENOUS

## 2022-01-26 MED ORDER — ACETAMINOPHEN 650 MG RE SUPP: 325.0000 mg | RECTAL | Status: DC | PRN

## 2022-01-26 MED ORDER — PANTOPRAZOLE SODIUM 40 MG PO TBEC
40.0000 mg | DELAYED_RELEASE_TABLET | Freq: Every day | ORAL | Status: DC
  Administered 2022-01-26 – 2022-01-28 (×3): 40 mg via ORAL
  Filled 2022-01-26 (×3): qty 1

## 2022-01-26 MED ORDER — ALBUMIN HUMAN 5 % IV SOLN: INTRAVENOUS | Status: DC | PRN

## 2022-01-26 MED ORDER — MIDAZOLAM HCL 2 MG/2ML IJ SOLN
INTRAMUSCULAR | Status: AC
  Filled 2022-01-26: qty 2

## 2022-01-26 MED ORDER — HYDRALAZINE HCL 20 MG/ML IJ SOLN: 5.0000 mg | INTRAMUSCULAR | Status: DC | PRN

## 2022-01-26 MED ORDER — PHENYLEPHRINE 80 MCG/ML (10ML) SYRINGE FOR IV PUSH (FOR BLOOD PRESSURE SUPPORT)
PREFILLED_SYRINGE | INTRAVENOUS | Status: DC | PRN
  Administered 2022-01-26: 80 ug via INTRAVENOUS
  Administered 2022-01-26: 160 ug via INTRAVENOUS
  Administered 2022-01-26: 80 ug via INTRAVENOUS
  Administered 2022-01-26: 160 ug via INTRAVENOUS

## 2022-01-26 MED ORDER — PROPOFOL 10 MG/ML IV BOLUS
INTRAVENOUS | Status: AC
  Filled 2022-01-26: qty 20

## 2022-01-26 MED ORDER — ROCURONIUM BROMIDE 10 MG/ML (PF) SYRINGE
PREFILLED_SYRINGE | INTRAVENOUS | Status: DC | PRN
  Administered 2022-01-26: 40 mg via INTRAVENOUS
  Administered 2022-01-26: 60 mg via INTRAVENOUS

## 2022-01-26 MED ORDER — DEXAMETHASONE SODIUM PHOSPHATE 10 MG/ML IJ SOLN
INTRAMUSCULAR | Status: AC
  Filled 2022-01-26: qty 1

## 2022-01-26 MED ORDER — OXYCODONE-ACETAMINOPHEN 5-325 MG PO TABS
1.0000 | ORAL_TABLET | ORAL | Status: DC | PRN
  Administered 2022-01-26: 1 via ORAL
  Filled 2022-01-26: qty 1

## 2022-01-26 MED ORDER — ORAL CARE MOUTH RINSE: 15.0000 mL | Freq: Once | OROMUCOSAL | Status: AC

## 2022-01-26 MED ORDER — CHLORHEXIDINE GLUCONATE CLOTH 2 % EX PADS: 6.0000 | MEDICATED_PAD | Freq: Once | CUTANEOUS | Status: DC

## 2022-01-26 MED ORDER — HEPARIN SODIUM (PORCINE) 5000 UNIT/ML IJ SOLN
5000.0000 [IU] | Freq: Three times a day (TID) | INTRAMUSCULAR | Status: DC
  Administered 2022-01-27 – 2022-01-28 (×4): 5000 [IU] via SUBCUTANEOUS
  Filled 2022-01-26 (×4): qty 1

## 2022-01-26 MED ORDER — CHLORHEXIDINE GLUCONATE 0.12 % MT SOLN
15.0000 mL | Freq: Once | OROMUCOSAL | Status: AC
  Administered 2022-01-26: 15 mL via OROMUCOSAL
  Filled 2022-01-26: qty 15

## 2022-01-26 MED ORDER — HEPARIN SODIUM (PORCINE) 1000 UNIT/ML IJ SOLN
INTRAMUSCULAR | Status: DC | PRN
  Administered 2022-01-26: 1000 [IU] via INTRAVENOUS
  Administered 2022-01-26: 2000 [IU] via INTRAVENOUS
  Administered 2022-01-26: 6000 [IU] via INTRAVENOUS
  Administered 2022-01-26: 1000 [IU] via INTRAVENOUS

## 2022-01-26 MED ORDER — POLYETHYLENE GLYCOL 3350 17 G PO PACK: 17.0000 g | PACK | Freq: Every day | ORAL | Status: DC | PRN

## 2022-01-26 MED ORDER — GUAIFENESIN-DM 100-10 MG/5ML PO SYRP: 15.0000 mL | ORAL_SOLUTION | ORAL | Status: DC | PRN

## 2022-01-26 MED ORDER — ACETAMINOPHEN 10 MG/ML IV SOLN
INTRAVENOUS | Status: DC | PRN
  Administered 2022-01-26: 1000 mg via INTRAVENOUS

## 2022-01-26 MED ORDER — FENTANYL CITRATE (PF) 250 MCG/5ML IJ SOLN
INTRAMUSCULAR | Status: AC
  Filled 2022-01-26: qty 5

## 2022-01-26 MED ORDER — HYDROMORPHONE HCL 1 MG/ML IJ SOLN
INTRAMUSCULAR | Status: DC | PRN
  Administered 2022-01-26: .5 mg via INTRAVENOUS

## 2022-01-26 MED ORDER — PROTAMINE SULFATE 10 MG/ML IV SOLN
INTRAVENOUS | Status: DC | PRN
  Administered 2022-01-26: 50 mg via INTRAVENOUS

## 2022-01-26 MED ORDER — MIDAZOLAM HCL 2 MG/2ML IJ SOLN
INTRAMUSCULAR | Status: DC | PRN
  Administered 2022-01-26: 2 mg via INTRAVENOUS

## 2022-01-26 MED ORDER — HEPARIN 6000 UNIT IRRIGATION SOLUTION
Status: AC
  Filled 2022-01-26: qty 500

## 2022-01-26 MED ORDER — EPHEDRINE SULFATE (PRESSORS) 50 MG/ML IJ SOLN: INTRAMUSCULAR | Status: DC | PRN

## 2022-01-26 MED ORDER — HYDROMORPHONE HCL 1 MG/ML IJ SOLN
INTRAMUSCULAR | Status: AC
  Filled 2022-01-26: qty 0.5

## 2022-01-26 MED ORDER — HEMOSTATIC AGENTS (NO CHARGE) OPTIME
TOPICAL | Status: DC | PRN
  Administered 2022-01-26 (×2): 1 via TOPICAL

## 2022-01-26 MED ORDER — VANCOMYCIN HCL IN DEXTROSE 1-5 GM/200ML-% IV SOLN
1000.0000 mg | INTRAVENOUS | Status: AC
  Administered 2022-01-26: 1000 mg via INTRAVENOUS
  Filled 2022-01-26: qty 200

## 2022-01-26 MED ORDER — ACETAMINOPHEN 10 MG/ML IV SOLN
INTRAVENOUS | Status: AC
  Filled 2022-01-26: qty 100

## 2022-01-26 MED ORDER — MORPHINE SULFATE (PF) 2 MG/ML IV SOLN: 2.0000 mg | INTRAVENOUS | Status: DC | PRN

## 2022-01-26 MED ORDER — HYDROCHLOROTHIAZIDE 25 MG PO TABS
25.0000 mg | ORAL_TABLET | Freq: Every day | ORAL | Status: DC
  Administered 2022-01-26 – 2022-01-28 (×2): 25 mg via ORAL
  Filled 2022-01-26 (×3): qty 1

## 2022-01-26 MED ORDER — HYDROMORPHONE HCL 1 MG/ML IJ SOLN: 0.2500 mg | INTRAMUSCULAR | Status: DC | PRN

## 2022-01-26 MED ORDER — HYDRALAZINE HCL 50 MG PO TABS
50.0000 mg | ORAL_TABLET | Freq: Two times a day (BID) | ORAL | Status: DC
  Administered 2022-01-26 – 2022-01-28 (×3): 50 mg via ORAL
  Filled 2022-01-26 (×4): qty 1

## 2022-01-26 MED ORDER — CILOSTAZOL 50 MG PO TABS
50.0000 mg | ORAL_TABLET | Freq: Two times a day (BID) | ORAL | Status: DC
  Administered 2022-01-26 – 2022-01-28 (×4): 50 mg via ORAL
  Filled 2022-01-26 (×6): qty 1

## 2022-01-26 MED ORDER — ONDANSETRON HCL 4 MG/2ML IJ SOLN
INTRAMUSCULAR | Status: AC
  Filled 2022-01-26: qty 2

## 2022-01-26 MED ORDER — BISACODYL 10 MG RE SUPP: 10.0000 mg | Freq: Every day | RECTAL | Status: DC | PRN

## 2022-01-26 MED ORDER — POTASSIUM CHLORIDE CRYS ER 20 MEQ PO TBCR: 20.0000 meq | EXTENDED_RELEASE_TABLET | Freq: Every day | ORAL | Status: DC | PRN

## 2022-01-26 MED ORDER — LIDOCAINE 2% (20 MG/ML) 5 ML SYRINGE
INTRAMUSCULAR | Status: AC
  Filled 2022-01-26: qty 5

## 2022-01-26 MED ORDER — EPHEDRINE 5 MG/ML INJ
INTRAVENOUS | Status: AC
  Filled 2022-01-26: qty 5

## 2022-01-26 MED ORDER — PROTAMINE SULFATE 10 MG/ML IV SOLN
INTRAVENOUS | Status: AC
  Filled 2022-01-26: qty 5

## 2022-01-26 MED ORDER — ROSUVASTATIN CALCIUM 20 MG PO TABS
40.0000 mg | ORAL_TABLET | Freq: Every day | ORAL | Status: DC
  Administered 2022-01-26 – 2022-01-28 (×3): 40 mg via ORAL
  Filled 2022-01-26 (×3): qty 2

## 2022-01-26 MED ORDER — 0.9 % SODIUM CHLORIDE (POUR BTL) OPTIME
TOPICAL | Status: DC | PRN
  Administered 2022-01-26: 2000 mL

## 2022-01-26 MED ORDER — HEPARIN 6000 UNIT IRRIGATION SOLUTION
Status: DC | PRN
  Administered 2022-01-26: 1

## 2022-01-26 MED ORDER — DOCUSATE SODIUM 100 MG PO CAPS
100.0000 mg | ORAL_CAPSULE | Freq: Every day | ORAL | Status: DC
  Administered 2022-01-27 – 2022-01-28 (×2): 100 mg via ORAL
  Filled 2022-01-26 (×2): qty 1

## 2022-01-26 MED ORDER — MAGNESIUM SULFATE 2 GM/50ML IV SOLN: 2.0000 g | Freq: Every day | INTRAVENOUS | Status: DC | PRN

## 2022-01-26 MED ORDER — SODIUM CHLORIDE 0.9 % IV SOLN: INTRAVENOUS | Status: DC

## 2022-01-26 MED ORDER — PROPOFOL 10 MG/ML IV BOLUS
INTRAVENOUS | Status: DC | PRN
  Administered 2022-01-26: 100 mg via INTRAVENOUS

## 2022-01-26 MED ORDER — ONDANSETRON HCL 4 MG/2ML IJ SOLN
INTRAMUSCULAR | Status: DC | PRN
  Administered 2022-01-26: 4 mg via INTRAVENOUS

## 2022-01-26 MED ORDER — PAPAVERINE HCL 30 MG/ML IJ SOLN
INTRAMUSCULAR | Status: AC
  Filled 2022-01-26: qty 2

## 2022-01-26 MED ORDER — HEPARIN SODIUM (PORCINE) 1000 UNIT/ML IJ SOLN
INTRAMUSCULAR | Status: AC
  Filled 2022-01-26: qty 10

## 2022-01-26 MED ORDER — METOPROLOL TARTRATE 5 MG/5ML IV SOLN: 2.0000 mg | INTRAVENOUS | Status: DC | PRN

## 2022-01-26 MED ORDER — VANCOMYCIN HCL IN DEXTROSE 1-5 GM/200ML-% IV SOLN
1000.0000 mg | Freq: Two times a day (BID) | INTRAVENOUS | Status: AC
  Administered 2022-01-26 – 2022-01-27 (×2): 1000 mg via INTRAVENOUS
  Filled 2022-01-26 (×2): qty 200

## 2022-01-26 MED ORDER — EPHEDRINE SULFATE-NACL 50-0.9 MG/10ML-% IV SOSY
PREFILLED_SYRINGE | INTRAVENOUS | Status: DC | PRN
  Administered 2022-01-26: 5 mg via INTRAVENOUS
  Administered 2022-01-26: 10 mg via INTRAVENOUS
  Administered 2022-01-26: 5 mg via INTRAVENOUS

## 2022-01-26 MED ORDER — LABETALOL HCL 5 MG/ML IV SOLN: 10.0000 mg | INTRAVENOUS | Status: DC | PRN

## 2022-01-26 MED ORDER — IRBESARTAN 150 MG PO TABS
75.0000 mg | ORAL_TABLET | Freq: Every day | ORAL | Status: DC
  Administered 2022-01-26 – 2022-01-28 (×2): 75 mg via ORAL
  Filled 2022-01-26 (×3): qty 1

## 2022-01-26 MED ORDER — SUGAMMADEX SODIUM 200 MG/2ML IV SOLN
INTRAVENOUS | Status: DC | PRN
  Administered 2022-01-26: 150 mg via INTRAVENOUS

## 2022-01-26 MED ORDER — FENTANYL CITRATE (PF) 250 MCG/5ML IJ SOLN
INTRAMUSCULAR | Status: DC | PRN
  Administered 2022-01-26 (×3): 50 ug via INTRAVENOUS
  Administered 2022-01-26: 100 ug via INTRAVENOUS

## 2022-01-26 MED ORDER — ALUM & MAG HYDROXIDE-SIMETH 200-200-20 MG/5ML PO SUSP: 15.0000 mL | ORAL | Status: DC | PRN

## 2022-01-26 MED ORDER — LIDOCAINE 2% (20 MG/ML) 5 ML SYRINGE
INTRAMUSCULAR | Status: DC | PRN
  Administered 2022-01-26: 60 mg via INTRAVENOUS

## 2022-01-26 MED ORDER — ACETAMINOPHEN 500 MG PO TABS
1000.0000 mg | ORAL_TABLET | Freq: Once | ORAL | Status: DC
  Filled 2022-01-26: qty 2

## 2022-01-26 MED ORDER — LACTATED RINGERS IV SOLN: INTRAVENOUS | Status: DC

## 2022-01-26 MED ORDER — ROCURONIUM BROMIDE 10 MG/ML (PF) SYRINGE
PREFILLED_SYRINGE | INTRAVENOUS | Status: AC
  Filled 2022-01-26: qty 10

## 2022-01-26 MED ORDER — EZETIMIBE 10 MG PO TABS
10.0000 mg | ORAL_TABLET | Freq: Every day | ORAL | Status: DC
  Administered 2022-01-26 – 2022-01-28 (×3): 10 mg via ORAL
  Filled 2022-01-26 (×3): qty 1

## 2022-01-26 MED ORDER — DEXAMETHASONE SODIUM PHOSPHATE 10 MG/ML IJ SOLN
INTRAMUSCULAR | Status: DC | PRN
  Administered 2022-01-26: 10 mg via INTRAVENOUS

## 2022-01-26 SURGICAL SUPPLY — 64 items
BAG COUNTER SPONGE SURGICOUNT (BAG) ×2 IMPLANT
BANDAGE ESMARK 6X9 LF (GAUZE/BANDAGES/DRESSINGS) IMPLANT
BNDG ESMARK 6X9 LF (GAUZE/BANDAGES/DRESSINGS) ×2
CANISTER SUCT 3000ML PPV (MISCELLANEOUS) ×2 IMPLANT
CANNULA VESSEL 3MM 2 BLNT TIP (CANNULA) ×2 IMPLANT
CATH EMB 3FR 40CM (CATHETERS) IMPLANT
CATH EMB 6FR 80CM (CATHETERS) IMPLANT
CLIP VESOCCLUDE MED 24/CT (CLIP) ×2 IMPLANT
CLIP VESOCCLUDE SM WIDE 24/CT (CLIP) ×2 IMPLANT
CUFF TOURN SGL QUICK 24 (TOURNIQUET CUFF) ×2
CUFF TOURN SGL QUICK 34 (TOURNIQUET CUFF)
CUFF TOURN SGL QUICK 42 (TOURNIQUET CUFF) IMPLANT
CUFF TRNQT CYL 24X4X16.5-23 (TOURNIQUET CUFF) IMPLANT
CUFF TRNQT CYL 34X4.125X (TOURNIQUET CUFF) IMPLANT
DERMABOND ADVANCED .7 DNX12 (GAUZE/BANDAGES/DRESSINGS) ×2 IMPLANT
DERMABOND ADVANCED .7 DNX6 (GAUZE/BANDAGES/DRESSINGS) IMPLANT
DRAIN CHANNEL 15F RND FF W/TCR (WOUND CARE) IMPLANT
DRAPE HALF SHEET 40X57 (DRAPES) IMPLANT
DRAPE X-RAY CASS 24X20 (DRAPES) IMPLANT
ELECT REM PT RETURN 9FT ADLT (ELECTROSURGICAL) ×2
ELECTRODE REM PT RTRN 9FT ADLT (ELECTROSURGICAL) ×2 IMPLANT
EVACUATOR SILICONE 100CC (DRAIN) IMPLANT
GLOVE SURG SS PI 7.5 STRL IVOR (GLOVE) ×6 IMPLANT
GOWN STRL REUS W/ TWL LRG LVL3 (GOWN DISPOSABLE) ×4 IMPLANT
GOWN STRL REUS W/ TWL XL LVL3 (GOWN DISPOSABLE) ×2 IMPLANT
GOWN STRL REUS W/TWL LRG LVL3 (GOWN DISPOSABLE) ×4
GOWN STRL REUS W/TWL XL LVL3 (GOWN DISPOSABLE) ×2
GRAFT PROPATEN W/RING 6X80X60 (Vascular Products) IMPLANT
HEMOSTAT SNOW SURGICEL 2X4 (HEMOSTASIS) IMPLANT
INSERT FOGARTY SM (MISCELLANEOUS) IMPLANT
KIT BASIN OR (CUSTOM PROCEDURE TRAY) ×2 IMPLANT
KIT TURNOVER KIT B (KITS) ×2 IMPLANT
MARKER GRAFT CORONARY BYPASS (MISCELLANEOUS) IMPLANT
NS IRRIG 1000ML POUR BTL (IV SOLUTION) ×4 IMPLANT
PACK PERIPHERAL VASCULAR (CUSTOM PROCEDURE TRAY) ×2 IMPLANT
PAD ARMBOARD 7.5X6 YLW CONV (MISCELLANEOUS) ×4 IMPLANT
PATCH HEMASHIELD 8X75 (Vascular Products) IMPLANT
SET COLLECT BLD 21X3/4 12 (NEEDLE) IMPLANT
SET WALTER ACTIVATION W/DRAPE (SET/KITS/TRAYS/PACK) IMPLANT
SPONGE INTESTINAL PEANUT (DISPOSABLE) ×2 IMPLANT
SPONGE T-LAP 18X18 ~~LOC~~+RFID (SPONGE) IMPLANT
STOPCOCK 4 WAY LG BORE MALE ST (IV SETS) IMPLANT
SURGIFLO W/THROMBIN 8M KIT (HEMOSTASIS) IMPLANT
SUT ETHILON 3 0 PS 1 (SUTURE) IMPLANT
SUT GORETEX 6.0 TT13 (SUTURE) IMPLANT
SUT GORETEX 6.0 TT9 (SUTURE) IMPLANT
SUT PROLENE 5 0 C 1 24 (SUTURE) ×2 IMPLANT
SUT PROLENE 6 0 BV (SUTURE) ×2 IMPLANT
SUT PROLENE 7 0 BV 1 (SUTURE) IMPLANT
SUT SILK 2 0 SH (SUTURE) ×2 IMPLANT
SUT SILK 3 0 (SUTURE)
SUT SILK 3-0 18XBRD TIE 12 (SUTURE) IMPLANT
SUT VIC AB 2-0 CT1 27 (SUTURE) ×4
SUT VIC AB 2-0 CT1 TAPERPNT 27 (SUTURE) ×4 IMPLANT
SUT VIC AB 3-0 SH 27 (SUTURE) ×4
SUT VIC AB 3-0 SH 27X BRD (SUTURE) ×4 IMPLANT
SUT VIC AB 3-0 X1 27 (SUTURE) ×2 IMPLANT
SUT VICRYL 4-0 PS2 18IN ABS (SUTURE) ×4 IMPLANT
SYR 3ML LL SCALE MARK (SYRINGE) IMPLANT
TOWEL GREEN STERILE (TOWEL DISPOSABLE) ×2 IMPLANT
TRAY FOLEY MTR SLVR 16FR STAT (SET/KITS/TRAYS/PACK) ×2 IMPLANT
TUBING EXTENTION W/L.L. (IV SETS) IMPLANT
UNDERPAD 30X36 HEAVY ABSORB (UNDERPADS AND DIAPERS) ×2 IMPLANT
WATER STERILE IRR 1000ML POUR (IV SOLUTION) ×2 IMPLANT

## 2022-01-26 NOTE — Anesthesia Postprocedure Evaluation (Signed)
Anesthesia Post Note  Patient: Katherine Archer  Procedure(s) Performed: REDO RIGHT COMMON FEMORAL ENDARTERECTOMY USING 0.8CM X 7.6CM HEMASHIELD PLATINUM FINESSE PATCH (Right: Leg Upper) RIGHT FEMORAL-POPLITEAL BYPASS GRAFT USING GORE 6MM X 80CM PROPATEN GRAFT (Right: Leg Upper) THROMBECTOMY RIGHT FEMORAL ARTERY (Right: Groin)     Patient location during evaluation: PACU Anesthesia Type: General Level of consciousness: awake and alert Pain management: pain level controlled Vital Signs Assessment: post-procedure vital signs reviewed and stable Respiratory status: spontaneous breathing, nonlabored ventilation and respiratory function stable Cardiovascular status: blood pressure returned to baseline and stable Postop Assessment: no apparent nausea or vomiting Anesthetic complications: no  No notable events documented.  Last Vitals:  Vitals:   01/26/22 1415 01/26/22 1439  BP: 135/61 104/61  Pulse: 80 78  Resp: 15 16  Temp:  36.4 C  SpO2: 97% 96%    Last Pain:  Vitals:   01/26/22 1439  TempSrc: Oral  PainSc:                  Eyla Tallon,W. EDMOND

## 2022-01-26 NOTE — Discharge Instructions (Signed)
° °[facility] ° °Discharge instructions ° °Lower Extremity Bypass Surgery ° °Please refer to the following instruction for your post-procedure care. Your surgeon or physician assistant will discuss any changes with you. ° °Activity ° °You are encouraged to walk as much as you can. You can slowly return to normal activities during the month after your surgery. Avoid strenuous activity and heavy lifting until your doctor tells you it's OK. Avoid activities such as vacuuming or swinging a golf club. Do not drive until your doctor give the OK and you are no longer taking prescription pain medications. It is also normal to have difficulty with sleep habits, eating and bowel movement after surgery. These will go away with time. ° °Bathing/Showering ° °Shower daily after you go home. Do not soak in a bathtub, hot tub, or swim until the incision heals completely. ° °Incision Care ° °Clean your incision with mild soap and water. Shower every day. Pat the area dry with a clean towel. You do not need a bandage unless otherwise instructed. Do not apply any ointments or creams to your incision. If you have open wounds you will be instructed how to care for them or a visiting nurse may be arranged for you. If you have staples or sutures along your incision they will be removed at your post-op appointment. You may have skin glue on your incision. Do not peel it off. It will come off on its own in about one week. ° °Wash the groin wound with soap and water daily and pat dry. (No tub bath-only shower)  Then put a dry gauze or washcloth in the groin to keep this area dry to help prevent wound infection.  Do this daily and as needed.  Do not use Vaseline or neosporin on your incisions.  Only use soap and water on your incisions and then protect and keep dry. ° °Diet ° °Resume your normal diet. There are no special food restrictions following this procedure. A low fat/ low cholesterol diet is  recommended for all patients with vascular disease. In order to heal from your surgery, it is CRITICAL to get adequate nutrition. Your body requires vitamins, minerals, and protein. Vegetables are the best source of vitamins and minerals. Vegetables also provide the perfect balance of protein. Processed food has little nutritional value, so try to avoid this. ° °Medications ° °Resume taking all your medications unless your doctor or physician assistant tells you not to. If your incision is causing pain, you may take over-the-counter pain relievers such as acetaminophen (Tylenol). If you were prescribed a stronger pain medication, please aware these medication can cause nausea and constipation. Prevent nausea by taking the medication with a snack or meal. Avoid constipation by drinking plenty of fluids and eating foods with high amount of fiber, such as fruits, vegetables, and grains. Take Colace 100 mg (an over-the-counter stool softener) twice a day as needed for constipation.  °Do not take Tylenol if you are taking prescription pain medications. ° °Follow Up ° °Our office will schedule a follow up appointment 2-3 weeks following discharge. ° °Please call us immediately for any of the following conditions ° °•Severe or worsening pain in your legs or feet while at rest or while walking •Increase pain, redness, warmth, or drainage (pus) from your incision site(s) °• Fever of 101 degree or higher °• The swelling in your leg with the bypass suddenly worsens and becomes more painful than when you were in the hospital °•   If you have been instructed to feel your graft pulse then you should do so every day. If you can no longer feel this pulse, call the office immediately. Not all patients are given this instruction. °•  °Leg swelling is common after leg bypass surgery. ° °The swelling should improve over a few months following surgery. To improve the swelling, you may elevate your legs above the level of your heart while  you are sitting or resting. Your surgeon or physician assistant may ask you to apply an ACE wrap or wear compression (TED) stockings to help to reduce swelling. ° °Reduce your risk of vascular disease ° °Stop smoking. If you would like help call [facility] at 1-800-QUIT-NOW (1-800-784-8669) or [place] at 336-586-4000. ° °• Manage your cholesterol °• Maintain a desired weight °• Control your diabetes weight °• Control your diabetes °• Keep your blood pressure down °•  °If you have any questions, please call the office at [phone] ° ° °

## 2022-01-26 NOTE — Op Note (Signed)
Patient name: Katherine Archer MRN: [id number] DOB: Feb 08, 1955 Sex: female  01/26/2022 Pre-operative Diagnosis: right toe ulcer Post-operative diagnosis:  Same Surgeon:  Annamarie Major Assistants:  Fallis Bing, PA Procedure:   #1: Right femoral endarterectomy with dacryon patch angioplasty   #2: Right femoral to above-knee popliteal artery bypass graft with 6 mm PTFE   #3: Redo right femoral artery exposure   #4: Thrombectomy, right common femoral and profundofemoral artery Anesthesia:  General Blood Loss:  150 cc Specimens:  none  Findings: Subacute appearing thrombus was seen in the common femoral artery extending into the main profunda branch.  I ended up having to open the profunda for approximately 2-1/2 cm.  I then performed endarterectomy including thrombectomy of the profunda and common femoral artery to remove all of the disease.  I then placed a dacryon patch onto the profundofemoral artery up onto the hood of the axillary femoral graft.  I then made a arteriotomy in the patch and performed a end-to-side anastomosis with a 6 mm external ring graft.  The graft was brought down to the above-knee popliteal artery which was a relatively healthy soft artery  Indications: This is a 67 year old female with history of axillary bifemoral bypass graft.  She has had intermittent wounds on her feet and has been complaining of significant pain in her right foot.  She recently had a CT scan that showed interval occlusion of the common femoral artery however the bypass remains patent.  Because of the occlusion and the wounds on her foot, I elected to proceed with surgical repair to recanalize the groin and perform bypass grafting.  WiFi;  1/1/0   Procedure:  The patient was identified in the holding area and taken to [facility]  The patient was then placed supine on the table. general anesthesia was administered.  The patient was prepped and draped in the usual sterile fashion.  A time out was called and  antibiotics were administered.  A PA was necessary to expedite the procedure and assist with technical details.  She help with exposure by providing suction and retraction.  She help with the anastomoses by following the suture.  She also help with wound closure.  The patient's previous longitudinal groin incision was opened with a 10 blade.  Cautery and sharp dissection were used to expose the dacryon graft.  I then sharply dissected out the common femoral, profundofemoral and superficial femoral artery.  I exposed the profundofemoral for approximately 3 cm.  The common femoral artery was also exposed.  Once I had adequate exposure attention was turned towards the popliteal artery.  A medial above-knee incision was made with a 10 blade.  Cautery was used to divide subcutaneous tissue and fascia.  I then entered the popliteal space and dissected out the above-knee popliteal artery which was a fairly healthy 5 mm artery.  Next a curved Gore tunneler was used to make a tunnel between the 2 incisions and the patient was fully heparinized.  Heparin levels were monitored with ACT measurements and redosed appropriately.  After the heparin circulated, the graft and femoral vessels were occluded with vascular clamps.  I made a graftotomy in the hood of the dacryon graft and the common femoral artery.  This was opened for approximately 2 and half centimeters.  There was subacute thrombus within the common femoral artery that extended into the profundofemoral artery.  I tried to pass a #3 Fogarty down the profundofemoral artery however I could not advance it  and so I elected to open the profundofemoral artery until I got beyond the thrombus.  This was about a 2-1/2 cm arteriotomy.  I then performed endarterectomy and remove the thrombus within the profunda and common femoral artery.  There was good backbleeding from the profunda.  I passed the Fogarty down and did not get any further clot.  I then used a #6 Fogarty to  perform thrombectomy of the dacryon graft and removed some subacute thrombus.  There was excellent inflow.  I then selected a dacryon patch and performed patch angioplasty down on the profundofemoral artery up onto the graftotomy.  This was done with a 5-0 Prolene.  Prior to completion the appropriate flushing maneuvers were performed and the anastomosis was completed.  Several repair sutures were required for hemostasis.  There was an excellent pulse in the common femoral artery and good Doppler signals in the profundofemoral artery.  I then reoccluded the vessels and made a opening in the patch which was extended longitudinally with Potts scissors.  This was in the distal portion of the common femoral artery.  A 6 mm external ring PTFE graft was then beveled to fit the size the arteriotomy in a running anastomosis was created with 6-0 Prolene.  Once this was completed there was excellent pulsatile flow through the graft.  The graft was then brought to the previously created tunnel.  A Webril was placed in the upper thigh followed by tourniquet.  The right leg was exsanguinated with an Esmarch and the tourniquet was taken at 250 mm of pressure.  I then opened the above-knee popliteal artery with a #11 blade and extended it with Potts scissors.  The artery was widely patent with only soft plaque.  The graft was then cut the appropriate length and beveled to fit the size the arteriotomy.  A running anastomosis was created with 6-0 Prolene.  Prior to completion, the tourniquet was let down and the appropriate flushing maneuvers were performed.  The anastomosis was then completed.  The patient was found to have graft dependent pedal Doppler signals.  I was satisfied with these results.  The patient's heparin was reversed with 50 mg of protamine.  Hemostasis was achieved.  The wounds were irrigated.  The above-knee incision was closed by reapproximating the fascia with 2-0 Vicryl, the subcutaneous tissue with 3-0  Vicryl, and the skin with 4-0 Vicryl followed by Dermabond.  The groin was closed by reapproximating the femoral sheath with 2-0 Vicryl, subcutaneous tissue with 3-0 Vicryl, and skin with 4-0 Vicryl followed by Dermabond.  The patient was successfully extubated and taken the recovery room in stable condition.  There were no immediate complications.   Disposition: To PACU stable.   Theotis Burrow, M.D., [facility] [place] Office: [phone] Pager:  [phone]

## 2022-01-26 NOTE — Progress Notes (Signed)
  Day of Surgery Note    Subjective:  resting comfortably   Vitals:   01/26/22 0557 01/26/22 1235  BP: (!) 115/90 (!) 88/54  Pulse: 60 79  Resp: 17 14  Temp: 97.7 F (36.5 C) 98.9 F (37.2 C)  SpO2: 99% 93%    Incisions:   right groin and right AK incisions both look good Extremities:  + brisk doppler flow right AT > pero/PT (monophasic) Cardiac:  regular Lungs:  non labored   Assessment/Plan:  This is a 67 y.o. female who is s/p  Redo right groin exposure and right femoral to above knee bypass  -pt with brisk doppler flow right AT and monophasic peroneal and PT doppler flow -incisions look fine -to 4 east later this afternoon   Leontine Locket, PA-C 01/26/2022 12:55 PM [phone]

## 2022-01-26 NOTE — Interval H&P Note (Signed)
History and Physical Interval Note:  01/26/2022 7:38 AM  Katherine Archer  has presented today for surgery, with the diagnosis of Atherosclerotic peripheral vascular disease with rest pain.  The various methods of treatment have been discussed with the patient and family. After consideration of risks, benefits and other options for treatment, the patient has consented to  Procedure(s): REDO RIGHT COMMON FEMORAL ENDARTERECTOMY (Right) RIGHT FEMORAL-POPLITEAL BYPASS GRAFT (Right) as a surgical intervention.  The patient's history has been reviewed, patient examined, no change in status, stable for surgery.  I have reviewed the patient's chart and labs.  Questions were answered to the patient's satisfaction.     Annamarie Major

## 2022-01-26 NOTE — Anesthesia Procedure Notes (Signed)
Procedure Name: Intubation Date/Time: 01/26/2022 8:07 AM  Performed by: Mosetta Pigeon, CRNAPre-anesthesia Checklist: Patient identified, Emergency Drugs available, Suction available and Patient being monitored Patient Re-evaluated:Patient Re-evaluated prior to induction Oxygen Delivery Method: Circle System Utilized Preoxygenation: Pre-oxygenation with 100% oxygen Induction Type: IV induction Ventilation: Mask ventilation without difficulty Laryngoscope Size: Mac and 3 Grade View: Grade I Tube type: Oral Number of attempts: 1 Airway Equipment and Method: Stylet Placement Confirmation: ETT inserted through vocal cords under direct vision, positive ETCO2 and breath sounds checked- equal and bilateral Secured at: 21 cm Tube secured with: Tape Dental Injury: Teeth and Oropharynx as per pre-operative assessment

## 2022-01-26 NOTE — Transfer of Care (Signed)
Immediate Anesthesia Transfer of Care Note  Patient: Katherine Archer  Procedure(s) Performed: REDO RIGHT COMMON FEMORAL ENDARTERECTOMY USING 0.8CM X 7.6CM HEMASHIELD PLATINUM FINESSE PATCH (Right: Leg Upper) RIGHT FEMORAL-POPLITEAL BYPASS GRAFT USING GORE 6MM X 80CM PROPATEN GRAFT (Right: Leg Upper) THROMBECTOMY RIGHT FEMORAL ARTERY (Right: Groin)  Patient Location: PACU  Anesthesia Type:General  Level of Consciousness: drowsy and patient cooperative  Airway & Oxygen Therapy: Patient Spontanous Breathing  Post-op Assessment: Report given to RN and Post -op Vital signs reviewed and stable  Post vital signs: Reviewed and stable  Last Vitals:  Vitals Value Taken Time  BP 88/54 01/26/22 1235  Temp    Pulse 82 01/26/22 1237  Resp 17 01/26/22 1237  SpO2 93 % 01/26/22 1237  Vitals shown include unvalidated device data.  Last Pain:  Vitals:   01/26/22 0626  TempSrc:   PainSc: 2       Patients Stated Pain Goal: 2 (51/89/84 2103)  Complications: No notable events documented.

## 2022-01-26 NOTE — Anesthesia Procedure Notes (Addendum)
Arterial Line Insertion Start/End1/24/2024 7:05 AM, 01/26/2022 7:10 AM Performed by: Roderic Palau, MD, Shalina Norfolk, Gwenyth Allegra, CRNA, CRNA  Patient location: Pre-op. Preanesthetic checklist: patient identified, IV checked, site marked, risks and benefits discussed, surgical consent, monitors and equipment checked, pre-op evaluation, timeout performed and anesthesia consent Lidocaine 1% used for infiltration Left, radial was placed Catheter size: 20 G Hand hygiene performed  and Seldinger technique used Allen's test indicative of satisfactory collateral circulation Attempts: 1 Procedure performed without using ultrasound guided technique. Following insertion, dressing applied and Biopatch. Post procedure assessment: normal

## 2022-01-26 NOTE — Progress Notes (Signed)
Pt admitted to rm 6 from PACU. CHG wipe given. Initiated tele. VSS. Call bell within reach.   Lavenia Atlas, RN

## 2022-01-27 ENCOUNTER — Encounter (HOSPITAL_COMMUNITY): Payer: Self-pay | Admitting: Surgery

## 2022-01-27 LAB — CBC
HCT: 29.1 % — ABNORMAL LOW (ref 36.0–46.0)
Hemoglobin: 10.1 g/dL — ABNORMAL LOW (ref 12.0–15.0)
MCH: 32.9 pg (ref 26.0–34.0)
MCHC: 34.7 g/dL (ref 30.0–36.0)
MCV: 94.8 fL (ref 80.0–100.0)
Platelets: 90 10*3/uL — ABNORMAL LOW (ref 150–400)
RBC: 3.07 MIL/uL — ABNORMAL LOW (ref 3.87–5.11)
RDW: 12.5 % (ref 11.5–15.5)
WBC: 9.6 10*3/uL (ref 4.0–10.5)
nRBC: 0 % (ref 0.0–0.2)

## 2022-01-27 LAB — BASIC METABOLIC PANEL
Anion gap: 9 (ref 5–15)
BUN: 11 mg/dL (ref 8–23)
CO2: 23 mmol/L (ref 22–32)
Calcium: 8.7 mg/dL — ABNORMAL LOW (ref 8.9–10.3)
Chloride: 106 mmol/L (ref 98–111)
Creatinine, Ser: 0.6 mg/dL (ref 0.44–1.00)
GFR, Estimated: >60 mL/min (ref >60)
Glucose, Bld: 128 mg/dL — ABNORMAL HIGH (ref 70–99)
Potassium: 3.7 mmol/L (ref 3.5–5.1)
Sodium: 138 mmol/L (ref 135–145)

## 2022-01-27 LAB — LIPID PANEL
Cholesterol: 133 mg/dL (ref 0–200)
HDL: 36 mg/dL — ABNORMAL LOW (ref >40)
LDL Cholesterol: 85 mg/dL (ref 0–99)
Total CHOL/HDL Ratio: 3.7 RATIO
Triglycerides: 59 mg/dL (ref <150)
VLDL: 12 mg/dL (ref 0–40)

## 2022-01-27 MED ORDER — TRAMADOL HCL 50 MG PO TABS
50.0000 mg | ORAL_TABLET | Freq: Four times a day (QID) | ORAL | Status: DC
  Administered 2022-01-27 – 2022-01-28 (×4): 50 mg via ORAL
  Filled 2022-01-27 (×5): qty 1

## 2022-01-27 NOTE — Progress Notes (Addendum)
  Progress Note    01/27/2022 7:36 AM 1 Day Post-Op  Subjective:  no complaints this morning   Vitals:   01/27/22 0000 01/27/22 0400  BP: (!) 131/49 (!) 97/56  Pulse: 76 63  Resp: 16 18  Temp: 97.8 F (36.6 C) 98 F (36.7 C)  SpO2: 97% 97%   Physical Exam: Lungs:  non labored Incisions:  R groin incision with some fullness but no firm fluid collection; popliteal incision c/d/i Extremities:  palpable R DP pulse Neurologic: A&O  CBC    Component Value Date/Time   WBC 9.6 01/27/2022 0403   RBC 3.07 (L) 01/27/2022 0403   HGB 10.1 (L) 01/27/2022 0403   HGB 14.2 03/23/2021 0928   HCT 29.1 (L) 01/27/2022 0403   HCT 42.1 03/23/2021 0928   PLT 90 (L) 01/27/2022 0403   PLT 135 (L) 03/23/2021 0928   MCV 94.8 01/27/2022 0403   MCV 93 03/23/2021 0928   MCH 32.9 01/27/2022 0403   MCHC 34.7 01/27/2022 0403   RDW 12.5 01/27/2022 0403   RDW 12.0 03/23/2021 0928   LYMPHSABS 1.9 03/23/2021 0928   MONOABS 0.7 11/05/2013 1133   EOSABS 0.3 03/23/2021 0928   BASOSABS 0.0 03/23/2021 0928    BMET    Component Value Date/Time   NA 138 01/27/2022 0403   NA 138 01/15/2020 1128   K 3.7 01/27/2022 0403   CL 106 01/27/2022 0403   CO2 23 01/27/2022 0403   GLUCOSE 128 (H) 01/27/2022 0403   BUN 11 01/27/2022 0403   BUN 8 01/15/2020 1128   CREATININE 0.60 01/27/2022 0403   CALCIUM 8.7 (L) 01/27/2022 0403   GFRNONAA >60 01/27/2022 0403   GFRAA 80 01/15/2020 1128    INR    Component Value Date/Time   INR 1.0 01/24/2022 1530     Intake/Output Summary (Last 24 hours) at 01/27/2022 0736 Last data filed at 01/27/2022 0555 Gross per 24 hour  Intake 4251.46 ml  Output 3050 ml  Net 1201.46 ml     Assessment/Plan:  67 y.o. female is s/p redo R femoral to popliteal bypass with PTFE for 3rd toe ulcer 1 Day Post-Op   R foot well perfused with palpable DP pulse Some fullness R groin incision; will continue to monitor OOB today with therapy Home when mobility improved and pain  controlled   Dagoberto Ligas, PA-C [facility] [phone] 01/27/2022 7:36 AM   I agree with the above.  Have seen and evaluate the patient.  She appears to be comfortable.  Her right foot is now with a palpable pulse.  Her incisions are clean dry and intact.  She can be discharged home when she clears physical therapy  Annamarie Major

## 2022-01-27 NOTE — Evaluation (Signed)
Physical Therapy Evaluation Patient Details Name: Katherine Archer MRN: [id number] DOB: March 28, 1955 Today's Date: 01/27/2022  History of Present Illness  Katherine Archer is a 67 y.o. female, with history of right axillary bifemoral bypass graft on 11/11/2019.  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  PMH positive for PAD, stroke, HTN, HLD.  Clinical Impression  Patient presents with decreased mobility due to pain in R groin and knee with limited AROM, pain and decreased balance.  Currently minguard to S for hallway ambulation with RW.  Reports independent at baseline living with her daughter and grandson.  Feel she should be able to return home without PT follow up.  Will continue to follow in acute setting.        Recommendations for follow up therapy are one component of a multi-disciplinary discharge planning process, led by the attending physician.  Recommendations may be updated based on patient status, additional functional criteria and insurance authorization.  Follow Up Recommendations No PT follow up      Assistance Recommended at Discharge Intermittent Supervision/Assistance  Patient can return home with the following  Assistance with cooking/housework;Assist for transportation;Help with stairs or ramp for entrance    Equipment Recommendations Rolling walker (2 wheels)  Recommendations for Other Services       Functional Status Assessment Patient has had a recent decline in their functional status and demonstrates the ability to make significant improvements in function in a reasonable and predictable amount of time.     Precautions / Restrictions Precautions Precautions: Fall      Mobility  Bed Mobility Overal bed mobility: Modified Independent                  Transfers Overall transfer level: Needs  assistance Equipment used: Rolling walker (2 wheels) Transfers: Sit to/from Stand Sit to Stand: Min guard           General transfer comment: for safety/balance coming up with R LE extended due to pain at groin incision    Ambulation/Gait Ambulation/Gait assistance: Supervision, Min guard Gait Distance (Feet): 200 Feet Assistive device: Rolling walker (2 wheels) Gait Pattern/deviations: Step-through pattern, Decreased stride length       General Gait Details: mild antalgia on R initially  Stairs            Wheelchair Mobility    Modified Rankin (Stroke Patients Only)       Balance Overall balance assessment: No apparent balance deficits (not formally assessed)                                           Pertinent Vitals/Pain Pain Assessment Pain Assessment: 0-10 Pain Score: 2  Pain Location: R LE Pain Descriptors / Indicators: Discomfort Pain Intervention(s): Monitored during session    Home Living Family/patient expects to be discharged to:: Private residence Living Arrangements: Children Available Help at Discharge: Family;Available 24 hours/day Type of Home: Mobile home Home Access: Stairs to enter Entrance Stairs-Rails: Right;Left Entrance Stairs-Number of Steps: 6   Home Layout: One level Home Equipment: Cane - single point      Prior Function Prior Level of Function : Independent/Modified Independent  Hand Dominance        Extremity/Trunk Assessment   Upper Extremity Assessment Upper Extremity Assessment: Overall WFL for tasks assessed    Lower Extremity Assessment Lower Extremity Assessment: RLE deficits/detail RLE Deficits / Details: incision noted at groin and medial lower leg just distal to knee and limited hip and knee flexion noted due to pain       Communication   Communication: Interpreter utilized;Prefers language other than English  Cognition Arousal/Alertness:  Awake/alert Behavior During Therapy: WFL for tasks assessed/performed Overall Cognitive Status: Within Functional Limits for tasks assessed                                          General Comments General comments (skin integrity, edema, etc.): encouraged ankle ROM and frequent ambulation assisted for circulation    Exercises     Assessment/Plan    PT Assessment Patient needs continued PT services  PT Problem List Decreased range of motion;Decreased mobility;Pain;Decreased knowledge of use of DME;Decreased safety awareness       PT Treatment Interventions DME instruction;Functional mobility training;Balance training;Patient/family education;Gait training;Stair training;Therapeutic exercise;Therapeutic activities    PT Goals (Current goals can be found in the Care Plan section)  Acute Rehab PT Goals Patient Stated Goal: return to independent PT Goal Formulation: With patient Time For Goal Achievement: 02/10/22 Potential to Achieve Goals: Good    Frequency Min 3X/week     Co-evaluation               AM-PAC PT "6 Clicks" Mobility  Outcome Measure Help needed turning from your back to your side while in a flat bed without using bedrails?: None Help needed moving from lying on your back to sitting on the side of a flat bed without using bedrails?: None Help needed moving to and from a bed to a chair (including a wheelchair)?: A Little Help needed standing up from a chair using your arms (e.g., wheelchair or bedside chair)?: A Little Help needed to walk in hospital room?: A Little Help needed climbing 3-5 steps with a railing? : Total 6 Click Score: 18    End of Session Equipment Utilized During Treatment: Gait belt Activity Tolerance: Patient tolerated treatment well Patient left: in chair;with call bell/phone within reach   PT Visit Diagnosis: Difficulty in walking, not elsewhere classified (R26.2)    Time: 0940-1003 PT Time Calculation (min)  (ACUTE ONLY): 23 min   Charges:   PT Evaluation $PT Eval Low Complexity: 1 Low PT Treatments $Gait Training: 8-22 mins        Magda Kiel, PT Acute Rehabilitation Services Office:[phone] 01/27/2022   Reginia Naas 01/27/2022, 10:50 AM

## 2022-01-27 NOTE — Progress Notes (Signed)
PHARMACIST LIPID MONITORING   Katherine Archer is a 67 y.o. female admitted on 01/26/2022 with PVD.  Pharmacy has been consulted to optimize lipid-lowering therapy with the indication of secondary prevention for clinical ASCVD.  Recent Labs:  Lipid Panel (last 6 months):   Lab Results  Component Value Date   CHOL 133 01/27/2022   TRIG 59 01/27/2022   HDL 36 (L) 01/27/2022   CHOLHDL 3.7 01/27/2022   VLDL 12 01/27/2022   LDLCALC 85 01/27/2022    Hepatic function panel (last 6 months):   Lab Results  Component Value Date   AST 20 01/24/2022   ALT 17 01/24/2022   ALKPHOS 83 01/24/2022   BILITOT 0.6 01/24/2022    SCr (since admission):   Serum creatinine: 0.6 mg/dL 01/27/22 0403 Estimated creatinine clearance: 69.1 mL/min  Current therapy and lipid therapy tolerance Current lipid-lowering therapy: Crestor '40mg'$ /day Documented or reported allergies or intolerances to lipid-lowering therapies (if applicable): none   Plan:    1.Statin intensity (high intensity recommended for all patients regardless of the LDL):  No statin changes. The patient is already on a high intensity statin.  2.Add ezetimibe (if any one of the following):    Already on Zetia  3.Refer to lipid clinic:   Yes  4.Follow-up with:  Lipid clinic referral. -LDL above goal on high dose statin and ezetimibe  5.Follow-up labs after discharge:  No changes in lipid therapy, repeat a lipid panel in one year.     Hildred Laser, PharmD Clinical Pharmacist **Pharmacist phone directory can now be found on [email or web address] ([email or web address]).  Listed under [place].

## 2022-01-27 NOTE — Evaluation (Signed)
Occupational Therapy Evaluation Patient Details Name: Katherine Archer MRN: [id number] DOB: [birth year] Today's Date: 01/27/2022   History of Present Illness Katherine Archer is a 67 y.o. female, with history of right axillary bifemoral bypass graft on 11/11/2019.  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  PMH positive for PAD, stroke, HTN, HLD.   Clinical Impression   Pt is typically independent. She is currently functioning at a set up to supervision level with RW in mobility and ADLs. Pt is declining a 3 in 1, requests a RW. No further OT needs.      Recommendations for follow up therapy are one component of a multi-disciplinary discharge planning process, led by the attending physician.  Recommendations may be updated based on patient status, additional functional criteria and insurance authorization.   Follow Up Recommendations  No OT follow up     Assistance Recommended at Discharge PRN  Patient can return home with the following Assistance with cooking/housework;Assist for transportation;Help with stairs or ramp for entrance    Functional Status Assessment  Patient has had a recent decline in their functional status and demonstrates the ability to make significant improvements in function in a reasonable and predictable amount of time.  Equipment Recommendations  None recommended by OT    Recommendations for Other Services       Precautions / Restrictions Precautions Precautions: Fall Restrictions Weight Bearing Restrictions: No      Mobility Bed Mobility Overal bed mobility: Modified Independent                  Transfers Overall transfer level: Needs assistance Equipment used: Rolling walker (2 wheels) Transfers: Sit to/from Stand Sit to Stand: Supervision                  Balance Overall balance  assessment: Mild deficits observed, not formally tested                                         ADL either performed or assessed with clinical judgement   ADL Overall ADL's : Needs assistance/impaired Eating/Feeding: Independent;Bed level   Grooming: Wash/dry hands;Standing;Supervision/safety   Upper Body Bathing: Set up;Sitting   Lower Body Bathing: Supervison/ safety;Sit to/from stand   Upper Body Dressing : Set up;Sitting   Lower Body Dressing: Supervision/safety;Sit to/from stand   Toilet Transfer: Supervision/safety;Transfer board;Rolling walker (2 wheels)   Toileting- Clothing Manipulation and Hygiene: Supervision/safety;Sit to/from stand       Functional mobility during ADLs: Supervision/safety;Rolling walker (2 wheels)       Vision Ability to See in Adequate Light: 0 Adequate       Perception     Praxis      Pertinent Vitals/Pain Pain Assessment Pain Assessment: Faces Faces Pain Scale: Hurts a little bit Pain Location: R LE Pain Descriptors / Indicators: Discomfort Pain Intervention(s): Monitored during session, Repositioned     Hand Dominance Right   Extremity/Trunk Assessment Upper Extremity Assessment Upper Extremity Assessment: Overall WFL for tasks assessed   Lower Extremity Assessment Lower Extremity Assessment: Defer to PT evaluation RLE Deficits / Details: incision noted at groin and medial lower leg just distal to knee and limited hip and knee flexion noted due to pain   Cervical / Trunk Assessment Cervical / Trunk  Assessment: Normal   Communication Communication Communication: Interpreter utilized;Prefers language other than English   Cognition Arousal/Alertness: Awake/alert Behavior During Therapy: WFL for tasks assessed/performed Overall Cognitive Status: Within Functional Limits for tasks assessed                                       General Comments  encouraged ankle ROM and frequent ambulation  assisted for circulation    Exercises     Shoulder Instructions      Home Living Family/patient expects to be discharged to:: Private residence Living Arrangements: Children Available Help at Discharge: Family;Available 24 hours/day Type of Home: Mobile home Home Access: Stairs to enter Entrance Stairs-Number of Steps: 6 Entrance Stairs-Rails: Right;Left Home Layout: One level     Bathroom Shower/Tub: Occupational psychologist: Standard     Home Equipment: Cane - single point          Prior Functioning/Environment Prior Level of Function : Independent/Modified Independent                        OT Problem List:        OT Treatment/Interventions:      OT Goals(Current goals can be found in the care plan section)    OT Frequency:      Co-evaluation              AM-PAC OT "6 Clicks" Daily Activity     Outcome Measure Help from another person eating meals?: None Help from another person taking care of personal grooming?: None Help from another person toileting, which includes using toliet, bedpan, or urinal?: None Help from another person bathing (including washing, rinsing, drying)?: None Help from another person to put on and taking off regular upper body clothing?: None Help from another person to put on and taking off regular lower body clothing?: None 6 Click Score: 24   End of Session Equipment Utilized During Treatment: Rolling walker (2 wheels);Gait belt  Activity Tolerance: Patient tolerated treatment well Patient left: in bed;with call bell/phone within reach  OT Visit Diagnosis: Unsteadiness on feet (R26.81);Pain                Time: 1257-1315 OT Time Calculation (min): 18 min Charges:  OT General Charges $OT Visit: 1 Visit OT Evaluation $OT Eval Low Complexity: [place], OTR/L Acute Rehabilitation Services Office: [phone]   Malka So 01/27/2022, 1:15 PM

## 2022-01-28 ENCOUNTER — Telehealth: Payer: Self-pay | Admitting: Physician Assistant

## 2022-01-28 LAB — CBC
HCT: 29.3 % — ABNORMAL LOW (ref 36.0–46.0)
Hemoglobin: 9.5 g/dL — ABNORMAL LOW (ref 12.0–15.0)
MCH: 31.5 pg (ref 26.0–34.0)
MCHC: 32.4 g/dL (ref 30.0–36.0)
MCV: 97 fL (ref 80.0–100.0)
Platelets: 83 10*3/uL — ABNORMAL LOW (ref 150–400)
RBC: 3.02 MIL/uL — ABNORMAL LOW (ref 3.87–5.11)
RDW: 12.8 % (ref 11.5–15.5)
WBC: 8.4 10*3/uL (ref 4.0–10.5)
nRBC: 0 % (ref 0.0–0.2)

## 2022-01-28 MED ORDER — TRAMADOL HCL 50 MG PO TABS: 50.0000 mg | ORAL_TABLET | Freq: Four times a day (QID) | ORAL | 0 refills | Status: DC | PRN

## 2022-01-28 NOTE — Telephone Encounter (Signed)
-----  Message from Dagoberto Ligas, PA-C sent at 01/28/2022  7:29 AM EST -----  2-3 weeks with PA on [place] office day.  PO R leg bypass Thanks

## 2022-01-28 NOTE — Progress Notes (Signed)
  Progress Note    01/28/2022 7:27 AM 2 Days Post-Op  Subjective:  no complaints   Vitals:   01/27/22 2325 01/28/22 0405  BP: (!) 84/60 (!) 95/59  Pulse:  91  Resp: 20 20  Temp: 98.2 F (36.8 C) 98.7 F (37.1 C)  SpO2: 100% 96%   Physical Exam: Lungs:  non labored Incisions:  R groin and popliteal incision c/d/i Extremities:  palpable R DP pulse Neurologic: A&O  CBC    Component Value Date/Time   WBC 8.4 01/28/2022 0206   RBC 3.02 (L) 01/28/2022 0206   HGB 9.5 (L) 01/28/2022 0206   HGB 14.2 03/23/2021 0928   HCT 29.3 (L) 01/28/2022 0206   HCT 42.1 03/23/2021 0928   PLT 83 (L) 01/28/2022 0206   PLT 135 (L) 03/23/2021 0928   MCV 97.0 01/28/2022 0206   MCV 93 03/23/2021 0928   MCH 31.5 01/28/2022 0206   MCHC 32.4 01/28/2022 0206   RDW 12.8 01/28/2022 0206   RDW 12.0 03/23/2021 0928   LYMPHSABS 1.9 03/23/2021 0928   MONOABS 0.7 11/05/2013 1133   EOSABS 0.3 03/23/2021 0928   BASOSABS 0.0 03/23/2021 0928    BMET    Component Value Date/Time   NA 138 01/27/2022 0403   NA 138 01/15/2020 1128   K 3.7 01/27/2022 0403   CL 106 01/27/2022 0403   CO2 23 01/27/2022 0403   GLUCOSE 128 (H) 01/27/2022 0403   BUN 11 01/27/2022 0403   BUN 8 01/15/2020 1128   CREATININE 0.60 01/27/2022 0403   CALCIUM 8.7 (L) 01/27/2022 0403   GFRNONAA >60 01/27/2022 0403   GFRAA 80 01/15/2020 1128    INR    Component Value Date/Time   INR 1.0 01/24/2022 1530     Intake/Output Summary (Last 24 hours) at 01/28/2022 0727 Last data filed at 01/27/2022 1700 Gross per 24 hour  Intake 480 ml  Output 200 ml  Net 280 ml     Assessment/Plan:  67 y.o. female is s/p redo R femoral to popliteal bypass with PTFE 2 Days Post-Op  R foot well perfused with palpable DP pulse Incisions are well appearing She was cleared by therapy teams yesterday with no recommendation for [place] today; office will arrange follow up in 2-3 weeks    Dagoberto Ligas, PA-C [facility] [phone] 01/28/2022 7:27 AM

## 2022-01-28 NOTE — Plan of Care (Signed)
  Problem: Nutrition: Goal: Adequate nutrition will be maintained 01/28/2022 1021 by Caroll Rancher, RN Outcome: Adequate for Discharge 01/28/2022 1020 by Caroll Rancher, RN Outcome: Adequate for Discharge 01/28/2022 1020 by Caroll Rancher, RN Outcome: Progressing   Problem: Elimination: Goal: Will not experience complications related to bowel motility 01/28/2022 1021 by Caroll Rancher, RN Outcome: Adequate for Discharge 01/28/2022 1020 by Caroll Rancher, RN Outcome: Adequate for Discharge 01/28/2022 1020 by Caroll Rancher, RN Outcome: Progressing Goal: Will not experience complications related to urinary retention 01/28/2022 1021 by Caroll Rancher, RN Outcome: Adequate for Discharge 01/28/2022 1020 by Caroll Rancher, RN Outcome: Adequate for Discharge   Problem: Elimination: Goal: Will not experience complications related to urinary retention 01/28/2022 1021 by Caroll Rancher, RN Outcome: Adequate for Discharge 01/28/2022 1020 by Caroll Rancher, RN Outcome: Adequate for Discharge   Problem: Pain Managment: Goal: General experience of comfort will improve 01/28/2022 1021 by Caroll Rancher, RN Outcome: Adequate for Discharge 01/28/2022 1020 by Caroll Rancher, RN Outcome: Adequate for Discharge   Problem: Safety: Goal: Ability to remain free from injury will improve 01/28/2022 1021 by Caroll Rancher, RN Outcome: Adequate for Discharge 01/28/2022 1020 by Caroll Rancher, RN Outcome: Adequate for Discharge   Problem: Acute Rehab PT Goals(only PT should resolve) Goal: Patient Will Transfer Sit To/From Stand Outcome: Adequate for Discharge Goal: Pt Will Perform Standing Balance Or Pre-Gait Outcome: Adequate for Discharge Goal: Pt Will Ambulate Outcome: Adequate for Discharge Goal: Pt Will Go Up/Down Stairs Outcome: Adequate for Discharge   Problem: Education: Goal:  Knowledge of prescribed regimen will improve 01/28/2022 1021 by Caroll Rancher, RN Outcome: Adequate for Discharge 01/28/2022 1020 by Caroll Rancher, RN Outcome: Adequate for Discharge 01/28/2022 1020 by Caroll Rancher, RN Outcome: Progressing   Problem: Activity: Goal: Ability to tolerate increased activity will improve 01/28/2022 1021 by Caroll Rancher, RN Outcome: Adequate for Discharge 01/28/2022 1020 by Caroll Rancher, RN Outcome: Adequate for Discharge 01/28/2022 1020 by Caroll Rancher, RN Outcome: Progressing

## 2022-01-28 NOTE — Progress Notes (Signed)
Patient discharged per MD order. IV and tele removed. Discharge instructions reviewed with patient and her daughter who is at bedside. Medical interpreter is also present for discussion. Questions answered to satisfaction, both parties verbalized understanding. Patient transported to personal vehicle to be discharged home.   Katherine Archer M

## 2022-01-28 NOTE — Progress Notes (Signed)
Physical Therapy Treatment Patient Details Name: Katherine Archer MRN: [id number] DOB: Nov 06, 1955 Today's Date: 01/28/2022   History of Present Illness Katherine Archer is a 67 y.o. female, with history of right axillary bifemoral bypass graft on 11/11/2019.  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  PMH positive for PAD, stroke, HTN, HLD.    PT Comments    Session focused on stair training in preparation for anticipated d/c home. Pt was able to demonstrate good carryover of cues for sequencing of feet on stairs. Educated pt on ROM exercises for her R lower extremity as she demonstrated stiffness and pain with mobility. Will continue to follow acutely. Current recommendations remain appropriate.     Recommendations for follow up therapy are one component of a multi-disciplinary discharge planning process, led by the attending physician.  Recommendations may be updated based on patient status, additional functional criteria and insurance authorization.  Follow Up Recommendations  No PT follow up     Assistance Recommended at Discharge Intermittent Supervision/Assistance  Patient can return home with the following Assistance with cooking/housework;Assist for transportation;Help with stairs or ramp for entrance   Equipment Recommendations  Rolling walker (2 wheels)    Recommendations for Other Services       Precautions / Restrictions Precautions Precautions: Fall Restrictions Weight Bearing Restrictions: No     Mobility  Bed Mobility Overal bed mobility: Modified Independent             General bed mobility comments: Extra time due to pain    Transfers Overall transfer level: Needs assistance Equipment used: Rolling walker (2 wheels) Transfers: Sit to/from Stand Sit to Stand: Min guard           General transfer comment:  for safety/balance coming up with R LE extended due to pain at groin incision    Ambulation/Gait Ambulation/Gait assistance: Supervision, Min guard Gait Distance (Feet): 170 Feet Assistive device: Rolling walker (2 wheels) Gait Pattern/deviations: Step-through pattern, Decreased stride length Gait velocity: reduced Gait velocity interpretation: <1.8 ft/sec, indicate of risk for recurrent falls   General Gait Details: mild antalgia on R initially, needs cues to keep RW on ground when turning. Noted x1 minor stagger when lifting RW to turn, but able to recover without assistance. Min guard-supervision for safety   Stairs Stairs: Yes Stairs assistance: Min guard Stair Management: One rail Right, One rail Left, Step to pattern, Forwards, Sideways Number of Stairs: 5 General stair comments: Ascends with R rail and descends with L to simulate home set-up. Cued pt to lead up with her L leg and down with her R, good compliance noted. Pt kept bil hands on the rail with a semi-sideways posture at times. No LOB, min guard for safety   Wheelchair Mobility    Modified Rankin (Stroke Patients Only)       Balance Overall balance assessment: Needs assistance Sitting-balance support: Feet supported, No upper extremity supported Sitting balance-Leahy Scale: Good     Standing balance support: Bilateral upper extremity supported, During functional activity, Reliant on assistive device for balance Standing balance-Leahy Scale: Poor Standing balance comment: Reliant on RW                            Cognition Arousal/Alertness: Awake/alert Behavior During Therapy: WFL for tasks assessed/performed Overall Cognitive Status: Within Functional Limits for  tasks assessed                                          Exercises General Exercises - Lower Extremity Ankle Circles/Pumps: AROM, Right, 10 reps, Supine Quad Sets: AROM, Right, 10 reps, Supine Heel Slides: AAROM,  Right, 10 reps, Supine Other Exercises Other Exercises: self stretch to R ankle into dorsiflexion with knee extended using bed sheet    General Comments        Pertinent Vitals/Pain Pain Assessment Pain Assessment: Faces Faces Pain Scale: Hurts even more Pain Location: R LE Pain Descriptors / Indicators: Discomfort, Operative site guarding, Grimacing Pain Intervention(s): Limited activity within patient's tolerance, Monitored during session, Repositioned    Home Living                          Prior Function            PT Goals (current goals can now be found in the care plan section) Acute Rehab PT Goals Patient Stated Goal: return to being independent PT Goal Formulation: With patient Time For Goal Achievement: 02/10/22 Potential to Achieve Goals: Good Progress towards PT goals: Progressing toward goals    Frequency    Min 3X/week      PT Plan Current plan remains appropriate    Co-evaluation              AM-PAC PT "6 Clicks" Mobility   Outcome Measure  Help needed turning from your back to your side while in a flat bed without using bedrails?: None Help needed moving from lying on your back to sitting on the side of a flat bed without using bedrails?: None Help needed moving to and from a bed to a chair (including a wheelchair)?: A Little Help needed standing up from a chair using your arms (e.g., wheelchair or bedside chair)?: A Little Help needed to walk in hospital room?: A Little Help needed climbing 3-5 steps with a railing? : A Little 6 Click Score: 20    End of Session Equipment Utilized During Treatment: Gait belt Activity Tolerance: Patient tolerated treatment well Patient left: in chair;with call bell/phone within reach   PT Visit Diagnosis: Difficulty in walking, not elsewhere classified (R26.2);Unsteadiness on feet (R26.81);Other abnormalities of gait and mobility (R26.89);Pain Pain - Right/Left: Right Pain - part of body:  Leg     Time: 1275-1700 PT Time Calculation (min) (ACUTE ONLY): 19 min  Charges:  $Gait Training: 8-22 mins                     Moishe Spice, PT, DPT Acute Rehabilitation Services  Office: [place] 01/28/2022, 9:06 AM

## 2022-01-28 NOTE — Discharge Summary (Signed)
Bypass Discharge Summary Patient ID: Katherine Archer [id number] 67 y.o. Feb 16, 1955  Admit date: 01/26/2022  Discharge date and time: 01/28/2022  1:05 PM   Admitting Physician: Serafina Mitchell, MD   Discharge Physician: same  Admission Diagnoses: Critical limb ischemia of right lower extremity [facility]) [I70.221]  Discharge Diagnoses: same  Admission Condition: poor  Discharged Condition: fair  Indication for Admission: Postoperative care  Hospital Course: Katherine Archer is a 67 year old female who was brought in as an outpatient and underwent redo right femoral to above-the-knee popliteal bypass with PTFE with femoral endarterectomy and dacryon patch angioplasty by Dr. Trula Slade on 01/26/2022 due to critical limb ischemia with toe ulceration.  She tolerated the procedure well and was admitted to the hospital postoperatively.  POD #1 patient had a palpable right DP pulse.  She was evaluated by therapy teams and cleared without recommendations for home health.  POD #2 she was ready for discharge home and incisions were all well-appearing.  She will follow-up in office in 2 to 3 weeks.  She had an uneventful hospital stay.  She was prescribed tramadol for 2 to 3 days for continued postoperative pain control.  She was discharged home in stable condition.  Consults: None  Treatments: surgery: Redo right femoral to above-the-knee popliteal bypass with PTFE with femoral endarterectomy and dacryon patch angioplasty by Dr. Trula Slade on 01/26/2022    Disposition: Discharge disposition: 01-Home or Self Care       - For [facility] Registry use ---  Post-op:  Wound infection: No  Graft infection: No  Transfusion: No   New Arrhythmia: No Patency judged by: '[ ]'$  Dopper only, '[ ]'$  Palpable graft pulse, [ x] Palpable distal pulse, '[ ]'$  ABI inc. > 0.15, '[ ]'$  Duplex D/C Ambulatory Status: Ambulatory  Complications: MI: [x ] No, '[ ]'$  Troponin only, '[ ]'$  EKG or Clinical CHF: No Resp failure: [ x] none, '[ ]'$   Pneumonia, '[ ]'$  Ventilator Chg in renal function: [ x] none, '[ ]'$  Inc. Cr > 0.5, '[ ]'$  Temp. Dialysis, '[ ]'$  Permanent dialysis Stroke: [x ] None, '[ ]'$  Minor, '[ ]'$  Major Return to OR: No  Reason for return to OR: '[ ]'$  Bleeding, '[ ]'$  Infection, '[ ]'$  Thrombosis, '[ ]'$  Revision  Discharge medications: Statin use:  Yes ASA use:  Yes Plavix use:  No  for medical reason not indicated Beta blocker use: No  for medical reason not indicated Coumadin use: No  for medical reason not indicated    Patient Instructions:  Allergies as of 01/28/2022       Reactions   Amoxicillin Other (See Comments)   Skin infection on face   Penicillins Other (See Comments)   Shaky and Dizziness 20 years ago        Medication List     TAKE these medications    alendronate 70 MG tablet Commonly known as: FOSAMAX Take 70 mg by mouth once a week. Notes to patient: Take as you were prior to admission    aspirin EC 81 MG tablet Take 81 mg by mouth daily.   cilostazol 50 MG tablet Commonly known as: PLETAL Take 1 tablet (50 mg total) by mouth 2 (two) times daily.   ezetimibe 10 MG tablet Commonly known as: ZETIA Take 10 mg by mouth daily.   hydrALAZINE 50 MG tablet Commonly known as: APRESOLINE Take 1 tablet (50 mg total) by mouth 3 (three) times daily. What changed: when to take this   hydrochlorothiazide 25 MG tablet  Commonly known as: HYDRODIURIL Take 25 mg by mouth daily.   rosuvastatin 40 MG tablet Commonly known as: CRESTOR Take 40 mg by mouth daily.   traMADol 50 MG tablet Commonly known as: ULTRAM Take 1 tablet (50 mg total) by mouth every 6 (six) hours as needed.   valsartan 80 MG tablet Commonly known as: DIOVAN Take 80 mg by mouth daily.               Durable Medical Equipment  (From admission, onward)           Start     Ordered   01/28/22 1234  For home use only DME Walker rolling  Once       Question Answer Comment  Walker: With 5 Inch Wheels   Patient needs a  walker to treat with the following condition Weakness generalized      01/28/22 1233           Activity: activity as tolerated Diet: regular diet Wound Care: keep wound clean and dry  Follow-up with [facility] in 2 weeks.  SignedDagoberto Ligas 01/28/2022 3:54 PM

## 2022-01-28 NOTE — Plan of Care (Signed)
  Problem: Education: Goal: Knowledge of prescribed regimen will improve 01/28/2022 1020 by Caroll Rancher, RN Outcome: Adequate for Discharge 01/28/2022 1020 by Caroll Rancher, RN Outcome: Progressing   Problem: Activity: Goal: Ability to tolerate increased activity will improve 01/28/2022 1020 by Caroll Rancher, RN Outcome: Adequate for Discharge 01/28/2022 1020 by Caroll Rancher, RN Outcome: Progressing   Problem: Bowel/Gastric: Goal: Gastrointestinal status for postoperative course will improve Outcome: Adequate for Discharge   Problem: Clinical Measurements: Goal: Postoperative complications will be avoided or minimized Outcome: Adequate for Discharge Goal: Signs and symptoms of graft occlusion will improve Outcome: Adequate for Discharge   Problem: Skin Integrity: Goal: Demonstration of wound healing without infection will improve Outcome: Adequate for Discharge   Problem: Education: Goal: Knowledge of General Education information will improve Description: Including pain rating scale, medication(s)/side effects and non-pharmacologic comfort measures Outcome: Adequate for Discharge   Problem: Health Behavior/Discharge Planning: Goal: Ability to manage health-related needs will improve Outcome: Adequate for Discharge   Problem: Clinical Measurements: Goal: Ability to maintain clinical measurements within normal limits will improve Outcome: Adequate for Discharge Goal: Will remain free from infection Outcome: Adequate for Discharge Goal: Diagnostic test results will improve Outcome: Adequate for Discharge Goal: Respiratory complications will improve Outcome: Adequate for Discharge Goal: Cardiovascular complication will be avoided Outcome: Adequate for Discharge   Problem: Nutrition: Goal: Adequate nutrition will be maintained 01/28/2022 1020 by Caroll Rancher, RN Outcome: Adequate for Discharge 01/28/2022 1020 by Caroll Rancher, RN Outcome: Progressing   Problem: Elimination: Goal: Will not experience complications related to bowel motility 01/28/2022 1020 by Caroll Rancher, RN Outcome: Adequate for Discharge 01/28/2022 1020 by Caroll Rancher, RN Outcome: Progressing Goal: Will not experience complications related to urinary retention Outcome: Adequate for Discharge   Problem: Pain Managment: Goal: General experience of comfort will improve Outcome: Adequate for Discharge   Problem: Safety: Goal: Ability to remain free from injury will improve Outcome: Adequate for Discharge

## 2022-01-28 NOTE — Plan of Care (Signed)
  Problem: Education: Goal: Knowledge of prescribed regimen will improve Outcome: Progressing   Problem: Activity: Goal: Ability to tolerate increased activity will improve Outcome: Progressing   Problem: Nutrition: Goal: Adequate nutrition will be maintained Outcome: Progressing   Problem: Elimination: Goal: Will not experience complications related to bowel motility Outcome: Progressing

## 2022-01-28 NOTE — TOC Transition Note (Addendum)
Transition of Care (TOC) - CM/SW Discharge Note Marvetta Gibbons RN, BSN Transitions of Care Unit 4E- RN Case Manager See Treatment Team for direct phone #   Patient Details  Name: Katherine Archer MRN: [id number] Date of Birth: July 06, 1955  Transition of Care [facility]) CM/SW Contact:  Dawayne Patricia, RN Phone Number: 01/28/2022, 11:32 AM   Clinical Narrative:    Pt stable for transition home later today, daughter to come and transport home.   Pt has [place] set up with [facility] with Vascular office protocol referral. [facility] liaison has been notified of discharge and they will contact pt for scheduling.   No further TOC needs noted.   1230- bedside RN reached out for DME needs- pt asking for RW,  follow up for DME order done and call made to [employer] for DME-RW referral- [employer] to deliver RW to room prior to discharge.    Final next level of care: [place] Barriers to Discharge: Barriers Resolved   Patient Goals and CMS Choice    Office protocol referral  Discharge Placement                 Home w/ [facility]        Discharge Plan and Services Additional resources added to the After Visit Summary for     Discharge Planning Services: CM Consult            DME Arranged: N/A- update- RW requested DME Agency: NA- update [employer], spoke with Jermaine 01/29/12 1230pm         [place]: [place] Date [place]: 01/28/22 Time [place]: 1132 Representative spoke with at [place]: [place] ([place]) Interventions [place]: No Food Insecurity (01/27/2022)  Housing: Low Risk  (01/27/2022)  Transportation Needs: No Transportation Needs (01/27/2022)  Utilities: Not At Risk (01/27/2022)  Depression (PHQ2-9): Low Risk  (06/29/2020)  Tobacco Use: Medium Risk (01/27/2022)     Readmission Risk Interventions    01/28/2022   11:32 AM 11/14/2019   12:55 PM 08/27/2019    2:45 PM  Readmission Risk  Prevention Plan  Post Dischage Appt Complete Complete Complete  Medication Screening Complete Complete Complete  Transportation Screening Complete Complete Complete

## 2022-01-29 ENCOUNTER — Telehealth: Payer: Self-pay

## 2022-01-29 DIAGNOSIS — Z8673 Personal history of transient ischemic attack (TIA), and cerebral infarction without residual deficits: Secondary | ICD-10-CM | POA: Diagnosis not present

## 2022-01-29 DIAGNOSIS — Z87891 Personal history of nicotine dependence: Secondary | ICD-10-CM | POA: Diagnosis not present

## 2022-01-29 DIAGNOSIS — E785 Hyperlipidemia, unspecified: Secondary | ICD-10-CM | POA: Diagnosis not present

## 2022-01-29 DIAGNOSIS — I70221 Atherosclerosis of native arteries of extremities with rest pain, right leg: Secondary | ICD-10-CM | POA: Diagnosis not present

## 2022-01-29 DIAGNOSIS — I739 Peripheral vascular disease, unspecified: Secondary | ICD-10-CM | POA: Diagnosis not present

## 2022-01-29 DIAGNOSIS — T82392D Other mechanical complication of femoral arterial graft (bypass), subsequent encounter: Secondary | ICD-10-CM | POA: Diagnosis not present

## 2022-01-29 DIAGNOSIS — Z7982 Long term (current) use of aspirin: Secondary | ICD-10-CM | POA: Diagnosis not present

## 2022-01-29 DIAGNOSIS — I700 Atherosclerosis of aorta: Secondary | ICD-10-CM | POA: Diagnosis not present

## 2022-01-29 DIAGNOSIS — I2510 Atherosclerotic heart disease of native coronary artery without angina pectoris: Secondary | ICD-10-CM | POA: Diagnosis not present

## 2022-01-29 DIAGNOSIS — I10 Essential (primary) hypertension: Secondary | ICD-10-CM | POA: Diagnosis not present

## 2022-01-29 NOTE — Telephone Encounter (Signed)
Received call from daughter asking for [facility] for patient. Patient discharged yesterday. Spoke with [employer] who stated that patient received BSC in 2021 and was not eligible at this time. Daughter made aware and provided resources of where she can private pay for [facility].

## 2022-01-31 DIAGNOSIS — I739 Peripheral vascular disease, unspecified: Secondary | ICD-10-CM | POA: Diagnosis not present

## 2022-01-31 DIAGNOSIS — Z7982 Long term (current) use of aspirin: Secondary | ICD-10-CM | POA: Diagnosis not present

## 2022-01-31 DIAGNOSIS — I70221 Atherosclerosis of native arteries of extremities with rest pain, right leg: Secondary | ICD-10-CM | POA: Diagnosis not present

## 2022-01-31 DIAGNOSIS — E785 Hyperlipidemia, unspecified: Secondary | ICD-10-CM | POA: Diagnosis not present

## 2022-01-31 DIAGNOSIS — Z8673 Personal history of transient ischemic attack (TIA), and cerebral infarction without residual deficits: Secondary | ICD-10-CM | POA: Diagnosis not present

## 2022-01-31 DIAGNOSIS — I10 Essential (primary) hypertension: Secondary | ICD-10-CM | POA: Diagnosis not present

## 2022-01-31 DIAGNOSIS — I700 Atherosclerosis of aorta: Secondary | ICD-10-CM | POA: Diagnosis not present

## 2022-01-31 DIAGNOSIS — T82392D Other mechanical complication of femoral arterial graft (bypass), subsequent encounter: Secondary | ICD-10-CM | POA: Diagnosis not present

## 2022-01-31 DIAGNOSIS — I2510 Atherosclerotic heart disease of native coronary artery without angina pectoris: Secondary | ICD-10-CM | POA: Diagnosis not present

## 2022-01-31 DIAGNOSIS — Z87891 Personal history of nicotine dependence: Secondary | ICD-10-CM | POA: Diagnosis not present

## 2022-02-01 DIAGNOSIS — Z87891 Personal history of nicotine dependence: Secondary | ICD-10-CM | POA: Diagnosis not present

## 2022-02-01 DIAGNOSIS — Z8673 Personal history of transient ischemic attack (TIA), and cerebral infarction without residual deficits: Secondary | ICD-10-CM | POA: Diagnosis not present

## 2022-02-01 DIAGNOSIS — I10 Essential (primary) hypertension: Secondary | ICD-10-CM | POA: Diagnosis not present

## 2022-02-01 DIAGNOSIS — I70221 Atherosclerosis of native arteries of extremities with rest pain, right leg: Secondary | ICD-10-CM | POA: Diagnosis not present

## 2022-02-01 DIAGNOSIS — I700 Atherosclerosis of aorta: Secondary | ICD-10-CM | POA: Diagnosis not present

## 2022-02-01 DIAGNOSIS — T82392D Other mechanical complication of femoral arterial graft (bypass), subsequent encounter: Secondary | ICD-10-CM | POA: Diagnosis not present

## 2022-02-01 DIAGNOSIS — I2510 Atherosclerotic heart disease of native coronary artery without angina pectoris: Secondary | ICD-10-CM | POA: Diagnosis not present

## 2022-02-01 DIAGNOSIS — E785 Hyperlipidemia, unspecified: Secondary | ICD-10-CM | POA: Diagnosis not present

## 2022-02-01 DIAGNOSIS — I739 Peripheral vascular disease, unspecified: Secondary | ICD-10-CM | POA: Diagnosis not present

## 2022-02-01 DIAGNOSIS — Z7982 Long term (current) use of aspirin: Secondary | ICD-10-CM | POA: Diagnosis not present

## 2022-02-03 DIAGNOSIS — I10 Essential (primary) hypertension: Secondary | ICD-10-CM | POA: Diagnosis not present

## 2022-02-03 DIAGNOSIS — Z87891 Personal history of nicotine dependence: Secondary | ICD-10-CM | POA: Diagnosis not present

## 2022-02-03 DIAGNOSIS — I2510 Atherosclerotic heart disease of native coronary artery without angina pectoris: Secondary | ICD-10-CM | POA: Diagnosis not present

## 2022-02-03 DIAGNOSIS — E785 Hyperlipidemia, unspecified: Secondary | ICD-10-CM | POA: Diagnosis not present

## 2022-02-03 DIAGNOSIS — T82392D Other mechanical complication of femoral arterial graft (bypass), subsequent encounter: Secondary | ICD-10-CM | POA: Diagnosis not present

## 2022-02-03 DIAGNOSIS — Z8673 Personal history of transient ischemic attack (TIA), and cerebral infarction without residual deficits: Secondary | ICD-10-CM | POA: Diagnosis not present

## 2022-02-03 DIAGNOSIS — Z7982 Long term (current) use of aspirin: Secondary | ICD-10-CM | POA: Diagnosis not present

## 2022-02-03 DIAGNOSIS — I70221 Atherosclerosis of native arteries of extremities with rest pain, right leg: Secondary | ICD-10-CM | POA: Diagnosis not present

## 2022-02-03 DIAGNOSIS — I739 Peripheral vascular disease, unspecified: Secondary | ICD-10-CM | POA: Diagnosis not present

## 2022-02-03 DIAGNOSIS — I700 Atherosclerosis of aorta: Secondary | ICD-10-CM | POA: Diagnosis not present

## 2022-02-04 ENCOUNTER — Telehealth: Payer: Self-pay

## 2022-02-04 NOTE — Telephone Encounter (Signed)
Earnest Bailey, RN with [phone] [facility] called stating that the pt's daughter, [name], has requested that [facility] be d/c'd. She stated that per [name], the visits are causing the pt mental breakdown and rages.  Msg sent to [place], CMA.

## 2022-02-07 DIAGNOSIS — I2510 Atherosclerotic heart disease of native coronary artery without angina pectoris: Secondary | ICD-10-CM | POA: Diagnosis not present

## 2022-02-07 DIAGNOSIS — E785 Hyperlipidemia, unspecified: Secondary | ICD-10-CM | POA: Diagnosis not present

## 2022-02-07 DIAGNOSIS — I700 Atherosclerosis of aorta: Secondary | ICD-10-CM | POA: Diagnosis not present

## 2022-02-07 DIAGNOSIS — T82392D Other mechanical complication of femoral arterial graft (bypass), subsequent encounter: Secondary | ICD-10-CM | POA: Diagnosis not present

## 2022-02-07 DIAGNOSIS — I70221 Atherosclerosis of native arteries of extremities with rest pain, right leg: Secondary | ICD-10-CM | POA: Diagnosis not present

## 2022-02-07 DIAGNOSIS — I739 Peripheral vascular disease, unspecified: Secondary | ICD-10-CM | POA: Diagnosis not present

## 2022-02-07 DIAGNOSIS — Z87891 Personal history of nicotine dependence: Secondary | ICD-10-CM | POA: Diagnosis not present

## 2022-02-07 DIAGNOSIS — Z7982 Long term (current) use of aspirin: Secondary | ICD-10-CM | POA: Diagnosis not present

## 2022-02-07 DIAGNOSIS — Z8673 Personal history of transient ischemic attack (TIA), and cerebral infarction without residual deficits: Secondary | ICD-10-CM | POA: Diagnosis not present

## 2022-02-07 DIAGNOSIS — I10 Essential (primary) hypertension: Secondary | ICD-10-CM | POA: Diagnosis not present

## 2022-02-17 ENCOUNTER — Telehealth: Payer: Self-pay

## 2022-02-17 NOTE — Telephone Encounter (Signed)
Pt's daughter, [name], called stating that the pt is having an itchy rash on her RLE. She states that it has spread to her LLE a small amount. Pt is trying not to scratch it. Pt denies any other symptoms such as swelling, redness, hot to touch areas, fever, SOB, etc. Pt has appt on Monday, 2/19. Pt denies any environmental changes or new foods. Instructed pt to take Benadryl as directed and/or use topical anti-itch creams and monitor for improvement.   Spoke to [place], [place] and [place], [place] who both agreed with that POC. They also advised for pt to reach out to PCP for any other suggestions. Relayed info to pt, confirmed understanding.

## 2022-02-21 ENCOUNTER — Encounter: Payer: Self-pay | Admitting: Physician Assistant

## 2022-02-21 ENCOUNTER — Ambulatory Visit (INDEPENDENT_AMBULATORY_CARE_PROVIDER_SITE_OTHER): Payer: Medicare HMO | Admitting: Physician Assistant

## 2022-02-21 VITALS — BP 134/79 | HR 83 | Temp 98.3°F | Resp 20 | Ht 67.0 in | Wt 123.6 lb

## 2022-02-21 DIAGNOSIS — I739 Peripheral vascular disease, unspecified: Secondary | ICD-10-CM

## 2022-02-21 DIAGNOSIS — I70229 Atherosclerosis of native arteries of extremities with rest pain, unspecified extremity: Secondary | ICD-10-CM

## 2022-02-21 NOTE — Progress Notes (Signed)
POST OPERATIVE OFFICE NOTE    CC:  F/u for surgery  HPI:  This is a 67 y.o. female who is s/p right common femoral endarterectomy with dacron patch angioplasty, right femoral to above knee popliteal artery bypass graft with PTFE, Redo right femoral artery exposure and thrombectomy of the right common femoral and profundofemoral arteries on 01/26/22 by Dr. Trula Slade.  This was performed due to CLI with rest pain and ulceration of right foot. She did very well post operatively and was discharge home POD#2.   Pt returns today for follow up with her grandson.  Pt states she is doing well. Pain is resolved in her right leg. Only some incisional pain. She has no concerns about her incisions. Her toe wounds are healed. She does have calluses present but she says they do not hurt her. She is not having any pain on ambulation or rest. No new wounds. She is compliant with Aspirin and statin  Allergies  Allergen Reactions   Amoxicillin Other (See Comments)    Skin infection on face   Penicillins Other (See Comments)    Shaky and Dizziness 20 years ago    Current Outpatient Medications  Medication Sig Dispense Refill   alendronate (FOSAMAX) 70 MG tablet Take 70 mg by mouth once a week.     aspirin 81 MG EC tablet Take 81 mg by mouth daily.     ezetimibe (ZETIA) 10 MG tablet Take 10 mg by mouth daily.     hydrochlorothiazide (HYDRODIURIL) 25 MG tablet Take 25 mg by mouth daily.     rosuvastatin (CRESTOR) 40 MG tablet Take 40 mg by mouth daily.     traMADol (ULTRAM) 50 MG tablet Take 1 tablet (50 mg total) by mouth every 6 (six) hours as needed. 20 tablet 0   valsartan (DIOVAN) 80 MG tablet Take 80 mg by mouth daily.     cilostazol (PLETAL) 50 MG tablet Take 1 tablet (50 mg total) by mouth 2 (two) times daily. (Patient not taking: Reported on 02/21/2022) 180 tablet 3   hydrALAZINE (APRESOLINE) 50 MG tablet Take 1 tablet (50 mg total) by mouth 3 (three) times daily. (Patient taking differently: Take 50  mg by mouth in the morning and at bedtime.) 270 tablet 3   No current facility-administered medications for this visit.     ROS:  See HPI  Physical Exam:  Vitals:   02/21/22 1248  BP: 134/79  Pulse: 83  Resp: 20  Temp: 98.3 F (36.8 C)  SpO2: 100%   General: well appearing, well nourished Lungs: non labored Cardiac: regular rate and rhythm Incision:  Right groin and right above knee popliteal artery incisions are intact and healing well Extremities:  BLE well perfused and warm with palpable DP. Calluses on right great toe, 3rd toe and lateral 5th toe. No open wounds Neuro: alert and oriented Abdomen:  flat, soft  Assessment/Plan:  This is a 67 y.o. female who is s/p: right common femoral endarterectomy with dacron patch angioplasty, right femoral to above knee popliteal artery bypass graft with PTFE, Redo right femoral artery exposure and thrombectomy of the right common femoral and profundofemoral arteries on 01/26/22 by Dr. Trula Slade.  This was performed due to CLI with rest pain and ulceration of right foot.  - Her rest pain is resolved and her right foot wounds healed. She does have calluses present on right foot but they are not bothering her - Her incisions are healing well. She will continue to wash these  with mild soap and water - Continue Aspirin and Statin - She will follow up in 1 month with RLE bypass graft duplex and ABI's   Katherine Archer, [facility] [phone]   Clinic MD:  Virl Cagey

## 2022-02-25 DIAGNOSIS — I10 Essential (primary) hypertension: Secondary | ICD-10-CM | POA: Diagnosis not present

## 2022-02-26 ENCOUNTER — Other Ambulatory Visit: Payer: Self-pay

## 2022-02-26 DIAGNOSIS — M79673 Pain in unspecified foot: Secondary | ICD-10-CM

## 2022-02-26 DIAGNOSIS — I739 Peripheral vascular disease, unspecified: Secondary | ICD-10-CM

## 2022-02-26 DIAGNOSIS — I70229 Atherosclerosis of native arteries of extremities with rest pain, unspecified extremity: Secondary | ICD-10-CM

## 2022-03-04 DIAGNOSIS — E785 Hyperlipidemia, unspecified: Secondary | ICD-10-CM | POA: Diagnosis not present

## 2022-03-04 DIAGNOSIS — I739 Peripheral vascular disease, unspecified: Secondary | ICD-10-CM | POA: Diagnosis not present

## 2022-03-07 DIAGNOSIS — Z4689 Encounter for fitting and adjustment of other specified devices: Secondary | ICD-10-CM | POA: Diagnosis not present

## 2022-03-07 DIAGNOSIS — N939 Abnormal uterine and vaginal bleeding, unspecified: Secondary | ICD-10-CM | POA: Diagnosis not present

## 2022-03-09 DIAGNOSIS — I739 Peripheral vascular disease, unspecified: Secondary | ICD-10-CM | POA: Diagnosis not present

## 2022-03-09 DIAGNOSIS — I69354 Hemiplegia and hemiparesis following cerebral infarction affecting left non-dominant side: Secondary | ICD-10-CM | POA: Diagnosis not present

## 2022-03-09 DIAGNOSIS — I10 Essential (primary) hypertension: Secondary | ICD-10-CM | POA: Diagnosis not present

## 2022-03-09 DIAGNOSIS — Z681 Body mass index (BMI) 19 or less, adult: Secondary | ICD-10-CM | POA: Diagnosis not present

## 2022-03-09 DIAGNOSIS — I700 Atherosclerosis of aorta: Secondary | ICD-10-CM | POA: Diagnosis not present

## 2022-03-21 DIAGNOSIS — N939 Abnormal uterine and vaginal bleeding, unspecified: Secondary | ICD-10-CM | POA: Diagnosis not present

## 2022-03-22 ENCOUNTER — Ambulatory Visit: Payer: Medicare HMO

## 2022-03-28 ENCOUNTER — Encounter (HOSPITAL_COMMUNITY): Payer: Medicare HMO

## 2022-03-28 ENCOUNTER — Other Ambulatory Visit (HOSPITAL_COMMUNITY): Payer: Medicare HMO

## 2022-03-28 ENCOUNTER — Ambulatory Visit: Payer: Medicare HMO

## 2022-04-03 DIAGNOSIS — R030 Elevated blood-pressure reading, without diagnosis of hypertension: Secondary | ICD-10-CM | POA: Diagnosis not present

## 2022-04-03 DIAGNOSIS — I10 Essential (primary) hypertension: Secondary | ICD-10-CM | POA: Diagnosis not present

## 2022-04-04 ENCOUNTER — Ambulatory Visit (HOSPITAL_COMMUNITY)
Admission: RE | Admit: 2022-04-04 | Discharge: 2022-04-04 | Disposition: A | Discharge status: Home / Self Care | Payer: Medicare HMO | Source: Ambulatory Visit | Attending: Surgery | Admitting: Surgery

## 2022-04-04 ENCOUNTER — Ambulatory Visit (INDEPENDENT_AMBULATORY_CARE_PROVIDER_SITE_OTHER)
Admission: RE | Admit: 2022-04-04 | Discharge: 2022-04-04 | Disposition: A | Discharge status: Home / Self Care | Payer: Medicare HMO | Source: Ambulatory Visit | Attending: Surgery | Admitting: Surgery

## 2022-04-04 ENCOUNTER — Ambulatory Visit (INDEPENDENT_AMBULATORY_CARE_PROVIDER_SITE_OTHER): Payer: Medicare HMO | Admitting: Physician Assistant

## 2022-04-04 VITALS — BP 154/69 | HR 53 | Temp 97.6°F | Wt 124.0 lb

## 2022-04-04 DIAGNOSIS — I739 Peripheral vascular disease, unspecified: Secondary | ICD-10-CM | POA: Insufficient documentation

## 2022-04-04 DIAGNOSIS — I70229 Atherosclerosis of native arteries of extremities with rest pain, unspecified extremity: Secondary | ICD-10-CM | POA: Diagnosis not present

## 2022-04-04 DIAGNOSIS — E785 Hyperlipidemia, unspecified: Secondary | ICD-10-CM | POA: Diagnosis not present

## 2022-04-04 DIAGNOSIS — M79673 Pain in unspecified foot: Secondary | ICD-10-CM | POA: Diagnosis not present

## 2022-04-04 LAB — VAS US ABI WITH/WO TBI
Left ABI: 0.59
Right ABI: 0.86

## 2022-04-04 NOTE — Progress Notes (Signed)
Office Note     CC:  follow up Requesting Provider:  [name], MD  HPI: Katherine Archer is a 67 y.o. (07/08/1955) female who presents for routine follow up of PAD. She is s/p right common femoral endarterectomy with dacron patch angioplasty, right femoral to above knee popliteal artery bypass graft with PTFE, Redo right femoral artery exposure and thrombectomy of the right common femoral and profundofemoral arteries on 01/26/22 by Dr. Trula Slade.  This was performed due to CLI with rest pain and ulceration of right foot. At her last visit in February her rest pain was resolved and her toe wounds were healed. She was not having any pain on ambulation or rest and no new wounds.  Today she reports no complaints. She says she has been walking 25-30 minutes without any pain. She is not having any pain at night and no new wounds. She does continue to have a plantar wart on her left foot and callus on her right great toe. She explains that puts some medication on it daily. She is compliant with Aspirin and statin, Zetia. She is medically managed for hypertension. She is also taking Pletal.    She has history of right axillary bifemoral bypass graft on 11/11/19 by Dr. Oneida Alar for rest pain. Bilateral femoral endarterectomies were also performed at that time. She has known left SFA occlusion.   Past Medical History:  Diagnosis Date   Cervical polyp 09/18/2018   Critical lower limb ischemia    Encounter for screening mammogram for breast cancer 05/05/2016   Fibrocystic breast changes of both breasts 04/29/2015   Hyperlipidemia    Hypertension    PAD (peripheral artery disease) 08/23/2019   Stroke 08/23/2019    Past Surgical History:  Procedure Laterality Date   ABDOMINAL AORTOGRAM W/LOWER EXTREMITY Bilateral 08/23/2019   Procedure: ABDOMINAL AORTOGRAM W/ Bilateral LOWER EXTREMITY Runoff;  Surgeon: [name], MD;  Location: [facility];  Service: Cardiovascular;  Laterality: Bilateral;    AXILLARY-FEMORAL BYPASS GRAFT Right 11/11/2019   Procedure: BYPASS GRAFT RIGHT AXILLA-BIFEMORAL;  Surgeon: [name], MD;  Location: [facility];  Service: Vascular;  Laterality: Right;   BRAIN SURGERY     BREAST SURGERY Bilateral 2014   1980 R breast surgery   CEREBRAL ANEURYSM REPAIR     ENDARTERECTOMY FEMORAL Bilateral 11/11/2019   Procedure: ENDARTERECTOMY FEMORAL BILATERALLY;  Surgeon: [name], MD;  Location: [facility];  Service: Vascular;  Laterality: Bilateral;   ENDARTERECTOMY FEMORAL Right 01/26/2022   Procedure: REDO RIGHT COMMON FEMORAL ENDARTERECTOMY USING 0.8CM X 7.6CM [place];  Surgeon: Serafina Mitchell, MD;  Location[facility];  Service: Vascular;  Laterality: Right;   FEMORAL-POPLITEAL BYPASS GRAFT Right 01/26/2022   Procedure: RIGHT FEMORAL-POPLITEAL BYPASS GRAFT USING GORE 6MM X 80CM PROPATEN GRAFT;  Surgeon: Serafina Mitchell, MD;  Location: [facility];  Service: Vascular;  Laterality: Right;   IR RADIOLOGIST EVAL & MGMT  04/02/2020   IR RADIOLOGIST EVAL & MGMT  03/29/2021   THROMBECTOMY FEMORAL ARTERY Right 01/26/2022   Procedure: THROMBECTOMY RIGHT FEMORAL ARTERY;  Surgeon: Serafina Mitchell, MD;  [facility];  Service: Vascular;  Laterality: Right;   TUBAL LIGATION  1990    Social History   Socioeconomic History   Marital status: Divorced    Spouse name: Not on file   Number of children: Not on file   Years of education: Not on file   Highest education level: Not on file  Occupational History  Not on file  Tobacco Use   Smoking status: Former    Packs/day: .25    Types: Cigarettes    Quit date: 10/04/2019    Years since quitting: 2.5    Passive exposure: Never   Smokeless tobacco: Never  Vaping Use   Vaping Use: Never used  Substance and Sexual Activity   Alcohol use: No   Drug use: No   Sexual activity: Not Currently  Other Topics Concern   Not on file  Social History Narrative   Not on file   Social Determinants of Health    Financial Resource Strain: Not on file  Food Insecurity: No Food Insecurity (01/27/2022)   Hunger Vital Sign    Worried About Running Out of Food in the Last Year: Never true    Ran Out of Food in the Last Year: Never true  Transportation Needs: No Transportation Needs (01/27/2022)   PRAPARE - Hydrologist (Medical): No    Lack of Transportation (Non-Medical): No  Physical Activity: Not on file  Stress: Not on file  Social Connections: Not on file  Intimate Partner Violence: Not At Risk (01/27/2022)   Humiliation, Afraid, Rape, and Kick questionnaire    Fear of Current or Ex-Partner: No    Emotionally Abused: No    Physically Abused: No    Sexually Abused: No    Family History  Problem Relation Age of Onset   Heart Problems Father    Heart Problems Brother    Heart Problems Brother     Current Outpatient Medications  Medication Sig Dispense Refill   alendronate (FOSAMAX) 70 MG tablet Take 70 mg by mouth once a week.     aspirin 81 MG EC tablet Take 81 mg by mouth daily.     cilostazol (PLETAL) 50 MG tablet Take 1 tablet (50 mg total) by mouth 2 (two) times daily. 180 tablet 3   ezetimibe (ZETIA) 10 MG tablet Take 10 mg by mouth daily.     hydrochlorothiazide (HYDRODIURIL) 25 MG tablet Take 25 mg by mouth daily.     rosuvastatin (CRESTOR) 40 MG tablet Take 40 mg by mouth daily.     traMADol (ULTRAM) 50 MG tablet Take 1 tablet (50 mg total) by mouth every 6 (six) hours as needed. 20 tablet 0   valsartan (DIOVAN) 80 MG tablet Take 80 mg by mouth daily.     hydrALAZINE (APRESOLINE) 50 MG tablet Take 1 tablet (50 mg total) by mouth 3 (three) times daily. (Patient taking differently: Take 50 mg by mouth in the morning and at bedtime.) 270 tablet 3   No current facility-administered medications for this visit.    Allergies  Allergen Reactions   Amoxicillin Other (See Comments)    Skin infection on face   Penicillins Other (See Comments)    Shaky  and Dizziness 20 years ago     REVIEW OF SYSTEMS:  [X]  denotes positive finding, [ ]  denotes negative finding Cardiac  Comments:  Chest pain or chest pressure:    Shortness of breath upon exertion:    Short of breath when lying flat:    Irregular heart rhythm:        Vascular    Pain in calf, thigh, or hip brought on by ambulation:    Pain in feet at night that wakes you up from your sleep:     Blood clot in your veins:    Leg swelling:  Pulmonary    Oxygen at home:    Productive cough:     Wheezing:         Neurologic    Sudden weakness in arms or legs:     Sudden numbness in arms or legs:     Sudden onset of difficulty speaking or slurred speech:    Temporary loss of vision in one eye:     Problems with dizziness:         Gastrointestinal    Blood in stool:     Vomited blood:         Genitourinary    Burning when urinating:     Blood in urine:        Psychiatric    Major depression:         Hematologic    Bleeding problems:    Problems with blood clotting too easily:        Skin    Rashes or ulcers:        Constitutional    Fever or chills:      PHYSICAL EXAMINATION:  Vitals:   04/04/22 1000  BP: (!) 154/69  Pulse: (!) 53  Temp: 97.6 F (36.4 C)  TempSrc: Temporal  SpO2: 97%  Weight: 124 lb (56.2 kg)    General:  WDWN in NAD; vital signs documented above Gait: Normal HENT: WNL, normocephalic Pulmonary: normal non-labored breathing , without Rales, rhonchi,  wheezing Cardiac: regular HR Abdomen: soft, NT, no masses Vascular Exam/Pulses: 2+ femoral pulses bilaterally, palpable right Dp. No palpable distal pulses on left foot. Feet both warm and well perfused. Callus on medial aspect of right great toe. Plantar wart present on mid plantar left foot. No signs of any infection Extremities: without ischemic changes, without Gangrene , without cellulitis; without open wounds;  Musculoskeletal: no muscle wasting or atrophy  Neurologic: A&O X  3 Psychiatric:  The pt has Normal affect.   Non-Invasive Vascular Imaging:   +-------+-----------+-----------+------------+------------+  ABI/TBIToday's ABIToday's TBIPrevious ABIPrevious TBI  +-------+-----------+-----------+------------+------------+  Right 0.86       0.6        0.47        0.22          +-------+-----------+-----------+------------+------------+  Left  0.59       0.34       0.83        0.43          +-------+-----------+-----------+------------+------------+  Toe pressure on RLE: 102 mmHg Toe pressure on LLE: 58 mmHg  Monophasic waveforms bilaterally  VAS [place] RLE bypass graft duplex:  Right Graft #1: fem-pop  +------------------+--------+--------+---------+--------+                   PSV cm/sStenosisWaveform Comments  +------------------+--------+--------+---------+--------+  Inflow           16              triphasic          +------------------+--------+--------+---------+--------+  Prox Anastomosis  67              triphasic          +------------------+--------+--------+---------+--------+  Proximal Graft    86              triphasic          +------------------+--------+--------+---------+--------+  Mid Graft         76              triphasic          +------------------+--------+--------+---------+--------+  Distal Graft      109             triphasic          +------------------+--------+--------+---------+--------+  Distal Anastomosis101             triphasic          +------------------+--------+--------+---------+--------+  Outflow          112             triphasic          +------------------+--------+--------+---------+--------+   Summary:  Right: Patent right fem-pop bypass graft with no stenosis.   ASSESSMENT/PLAN:: 68 y.o. female here for follow up for PAD. She is s/p right common femoral endarterectomy with dacron patch angioplasty, right femoral to above knee popliteal artery  bypass graft with PTFE, Redo right femoral artery exposure and thrombectomy of the right common femoral and profundofemoral arteries on 01/26/22 by Dr. Trula Slade.  This was performed due to CLI with rest pain and ulceration of right foot.  Her rest pain is resolved and her wounds are healed. She was not having any pain on ambulation or rest and no new wounds. - Duplex today shows patent right fem - [place] pop bypass with triphasic flow - ABI shows improved RLE ABI, left has decreased from prior duplex. Known Left SFA occlusion - Continue Aspirin, statin, Zetia - She will follow up in 6 months with repeat ABI and RLE bypass graft duplex - She knows to follow up sooner if she has any new or concerning symptoms    Karoline Caldwell, PA-C [facility] [phone]  On call MD:   Dr. Carlis Abbott

## 2022-04-05 ENCOUNTER — Other Ambulatory Visit: Payer: Self-pay

## 2022-04-05 DIAGNOSIS — I739 Peripheral vascular disease, unspecified: Secondary | ICD-10-CM

## 2022-04-05 DIAGNOSIS — I70229 Atherosclerosis of native arteries of extremities with rest pain, unspecified extremity: Secondary | ICD-10-CM

## 2022-04-20 ENCOUNTER — Ambulatory Visit: Payer: Medicare HMO

## 2022-05-04 DIAGNOSIS — E785 Hyperlipidemia, unspecified: Secondary | ICD-10-CM | POA: Diagnosis not present

## 2022-05-04 DIAGNOSIS — I739 Peripheral vascular disease, unspecified: Secondary | ICD-10-CM | POA: Diagnosis not present

## 2022-05-16 ENCOUNTER — Ambulatory Visit: Payer: Medicare HMO

## 2022-06-04 DIAGNOSIS — E785 Hyperlipidemia, unspecified: Secondary | ICD-10-CM | POA: Diagnosis not present

## 2022-06-04 DIAGNOSIS — I739 Peripheral vascular disease, unspecified: Secondary | ICD-10-CM | POA: Diagnosis not present

## 2022-06-07 DIAGNOSIS — E785 Hyperlipidemia, unspecified: Secondary | ICD-10-CM | POA: Diagnosis not present

## 2022-06-07 DIAGNOSIS — I10 Essential (primary) hypertension: Secondary | ICD-10-CM | POA: Diagnosis not present

## 2022-06-15 ENCOUNTER — Encounter: Payer: Self-pay | Admitting: Cardiology

## 2022-06-15 ENCOUNTER — Ambulatory Visit: Payer: Medicare HMO | Attending: Cardiovascular Disease

## 2022-06-15 DIAGNOSIS — T466X5A Adverse effect of antihyperlipidemic and antiarteriosclerotic drugs, initial encounter: Secondary | ICD-10-CM

## 2022-06-15 DIAGNOSIS — G720 Drug-induced myopathy: Secondary | ICD-10-CM | POA: Insufficient documentation

## 2022-06-15 DIAGNOSIS — Z1231 Encounter for screening mammogram for malignant neoplasm of breast: Secondary | ICD-10-CM | POA: Diagnosis not present

## 2022-06-15 HISTORY — DX: Adverse effect of antihyperlipidemic and antiarteriosclerotic drugs, initial encounter: T46.6X5A

## 2022-06-15 NOTE — Progress Notes (Deleted)
Patient ID: Katherine Archer                 DOB: Mar 14, 1955                    MRN: [id number]     HPI: Katherine Archer is a 67 y.o. female patient referred to lipid clinic by ***. PMH is significant for   Current Medications:  Intolerances:  Risk Factors:  LDL goal:   Diet:   Exercise:   Family History:   Social History:   Labs:  Past Medical History:  Diagnosis Date   Cervical polyp 09/18/2018   Critical lower limb ischemia (HCC)    Encounter for screening mammogram for breast cancer 05/05/2016   Fibrocystic breast changes of both breasts 04/29/2015   Hyperlipidemia    Hypertension    PAD (peripheral artery disease) (HCC) 08/23/2019   Stroke (HCC) 08/23/2019    Current Outpatient Medications on File Prior to Visit  Medication Sig Dispense Refill   alendronate (FOSAMAX) 70 MG tablet Take 70 mg by mouth once a week.     aspirin 81 MG EC tablet Take 81 mg by mouth daily.     cilostazol (PLETAL) 50 MG tablet Take 1 tablet (50 mg total) by mouth 2 (two) times daily. 180 tablet 3   ezetimibe (ZETIA) 10 MG tablet Take 10 mg by mouth daily.     hydrALAZINE (APRESOLINE) 50 MG tablet Take 1 tablet (50 mg total) by mouth 3 (three) times daily. (Patient taking differently: Take 50 mg by mouth in the morning and at bedtime.) 270 tablet 3   hydrochlorothiazide (HYDRODIURIL) 25 MG tablet Take 25 mg by mouth daily.     rosuvastatin (CRESTOR) 40 MG tablet Take 40 mg by mouth daily.     traMADol (ULTRAM) 50 MG tablet Take 1 tablet (50 mg total) by mouth every 6 (six) hours as needed. 20 tablet 0   valsartan (DIOVAN) 80 MG tablet Take 80 mg by mouth daily.     No current facility-administered medications on file prior to visit.    Allergies  Allergen Reactions   Amoxicillin Other (See Comments)    Skin infection on face   Penicillins Other (See Comments)    Shaky and Dizziness 20 years ago    Assessment/Plan:  1. Hyperlipidemia -  Reviewed options for lowering LDL cholesterol, including  statins, ezetimibe, PCSK9 inhibitors, Nexletol/Nexlizet, and Leqvio. Discussed mechanisms of action, dosing, side effects and potential decreases in LDL cholesterol. Also reviewed cost information and potential options for patient assistance.

## 2022-06-17 DIAGNOSIS — I10 Essential (primary) hypertension: Secondary | ICD-10-CM | POA: Diagnosis not present

## 2022-06-27 DIAGNOSIS — Z6820 Body mass index (BMI) 20.0-20.9, adult: Secondary | ICD-10-CM | POA: Diagnosis not present

## 2022-06-27 DIAGNOSIS — I10 Essential (primary) hypertension: Secondary | ICD-10-CM | POA: Diagnosis not present

## 2022-07-04 DIAGNOSIS — I739 Peripheral vascular disease, unspecified: Secondary | ICD-10-CM | POA: Diagnosis not present

## 2022-07-04 DIAGNOSIS — E785 Hyperlipidemia, unspecified: Secondary | ICD-10-CM | POA: Diagnosis not present

## 2022-07-26 DIAGNOSIS — Z4689 Encounter for fitting and adjustment of other specified devices: Secondary | ICD-10-CM | POA: Diagnosis not present

## 2022-08-04 DIAGNOSIS — I739 Peripheral vascular disease, unspecified: Secondary | ICD-10-CM | POA: Diagnosis not present

## 2022-08-04 DIAGNOSIS — Z1231 Encounter for screening mammogram for malignant neoplasm of breast: Secondary | ICD-10-CM | POA: Diagnosis not present

## 2022-08-04 DIAGNOSIS — E785 Hyperlipidemia, unspecified: Secondary | ICD-10-CM | POA: Diagnosis not present

## 2022-09-04 DIAGNOSIS — E785 Hyperlipidemia, unspecified: Secondary | ICD-10-CM | POA: Diagnosis not present

## 2022-09-04 DIAGNOSIS — I739 Peripheral vascular disease, unspecified: Secondary | ICD-10-CM | POA: Diagnosis not present

## 2022-09-09 DIAGNOSIS — Z8249 Family history of ischemic heart disease and other diseases of the circulatory system: Secondary | ICD-10-CM | POA: Diagnosis not present

## 2022-09-09 DIAGNOSIS — I719 Aortic aneurysm of unspecified site, without rupture: Secondary | ICD-10-CM | POA: Diagnosis not present

## 2022-09-09 DIAGNOSIS — I700 Atherosclerosis of aorta: Secondary | ICD-10-CM | POA: Diagnosis not present

## 2022-09-09 DIAGNOSIS — I10 Essential (primary) hypertension: Secondary | ICD-10-CM | POA: Diagnosis not present

## 2022-09-09 DIAGNOSIS — M810 Age-related osteoporosis without current pathological fracture: Secondary | ICD-10-CM | POA: Diagnosis not present

## 2022-09-09 DIAGNOSIS — I70221 Atherosclerosis of native arteries of extremities with rest pain, right leg: Secondary | ICD-10-CM | POA: Diagnosis not present

## 2022-09-09 DIAGNOSIS — I2510 Atherosclerotic heart disease of native coronary artery without angina pectoris: Secondary | ICD-10-CM | POA: Diagnosis not present

## 2022-09-09 DIAGNOSIS — I252 Old myocardial infarction: Secondary | ICD-10-CM | POA: Diagnosis not present

## 2022-09-09 DIAGNOSIS — I4581 Long QT syndrome: Secondary | ICD-10-CM | POA: Diagnosis not present

## 2022-09-09 DIAGNOSIS — E785 Hyperlipidemia, unspecified: Secondary | ICD-10-CM | POA: Diagnosis not present

## 2022-10-04 DIAGNOSIS — I739 Peripheral vascular disease, unspecified: Secondary | ICD-10-CM | POA: Diagnosis not present

## 2022-10-04 DIAGNOSIS — E785 Hyperlipidemia, unspecified: Secondary | ICD-10-CM | POA: Diagnosis not present

## 2022-10-24 ENCOUNTER — Ambulatory Visit (HOSPITAL_COMMUNITY): Admission: RE | Admit: 2022-10-24 | Payer: Medicare HMO | Source: Ambulatory Visit

## 2022-10-24 ENCOUNTER — Ambulatory Visit: Payer: Medicare HMO

## 2022-11-04 DIAGNOSIS — I739 Peripheral vascular disease, unspecified: Secondary | ICD-10-CM | POA: Diagnosis not present

## 2022-11-04 DIAGNOSIS — E785 Hyperlipidemia, unspecified: Secondary | ICD-10-CM | POA: Diagnosis not present

## 2022-11-14 DIAGNOSIS — I10 Essential (primary) hypertension: Secondary | ICD-10-CM | POA: Diagnosis not present

## 2022-11-14 DIAGNOSIS — Z131 Encounter for screening for diabetes mellitus: Secondary | ICD-10-CM | POA: Diagnosis not present

## 2022-11-14 DIAGNOSIS — E785 Hyperlipidemia, unspecified: Secondary | ICD-10-CM | POA: Diagnosis not present

## 2022-11-21 DIAGNOSIS — I69354 Hemiplegia and hemiparesis following cerebral infarction affecting left non-dominant side: Secondary | ICD-10-CM | POA: Diagnosis not present

## 2022-11-21 DIAGNOSIS — I10 Essential (primary) hypertension: Secondary | ICD-10-CM | POA: Diagnosis not present

## 2022-11-21 DIAGNOSIS — E785 Hyperlipidemia, unspecified: Secondary | ICD-10-CM | POA: Diagnosis not present

## 2022-11-21 DIAGNOSIS — Z681 Body mass index (BMI) 19 or less, adult: Secondary | ICD-10-CM | POA: Diagnosis not present

## 2022-11-21 DIAGNOSIS — I2510 Atherosclerotic heart disease of native coronary artery without angina pectoris: Secondary | ICD-10-CM | POA: Diagnosis not present

## 2022-12-04 DIAGNOSIS — E785 Hyperlipidemia, unspecified: Secondary | ICD-10-CM | POA: Diagnosis not present

## 2022-12-04 DIAGNOSIS — I739 Peripheral vascular disease, unspecified: Secondary | ICD-10-CM | POA: Diagnosis not present

## 2023-01-04 DIAGNOSIS — I739 Peripheral vascular disease, unspecified: Secondary | ICD-10-CM | POA: Diagnosis not present

## 2023-01-04 DIAGNOSIS — E785 Hyperlipidemia, unspecified: Secondary | ICD-10-CM | POA: Diagnosis not present

## 2023-02-04 DIAGNOSIS — I739 Peripheral vascular disease, unspecified: Secondary | ICD-10-CM | POA: Diagnosis not present

## 2023-02-04 DIAGNOSIS — E785 Hyperlipidemia, unspecified: Secondary | ICD-10-CM | POA: Diagnosis not present

## 2023-02-16 DIAGNOSIS — Z4689 Encounter for fitting and adjustment of other specified devices: Secondary | ICD-10-CM | POA: Diagnosis not present

## 2023-02-16 DIAGNOSIS — Z124 Encounter for screening for malignant neoplasm of cervix: Secondary | ICD-10-CM | POA: Diagnosis not present

## 2023-03-04 DIAGNOSIS — I739 Peripheral vascular disease, unspecified: Secondary | ICD-10-CM | POA: Diagnosis not present

## 2023-03-04 DIAGNOSIS — E785 Hyperlipidemia, unspecified: Secondary | ICD-10-CM | POA: Diagnosis not present

## 2023-03-27 DIAGNOSIS — I999 Unspecified disorder of circulatory system: Secondary | ICD-10-CM | POA: Diagnosis not present

## 2023-03-27 DIAGNOSIS — Z01818 Encounter for other preprocedural examination: Secondary | ICD-10-CM | POA: Diagnosis not present

## 2023-03-31 ENCOUNTER — Other Ambulatory Visit: Payer: Self-pay | Admitting: Surgery

## 2023-03-31 DIAGNOSIS — I70229 Atherosclerosis of native arteries of extremities with rest pain, unspecified extremity: Secondary | ICD-10-CM

## 2023-03-31 DIAGNOSIS — I739 Peripheral vascular disease, unspecified: Secondary | ICD-10-CM

## 2023-04-04 DIAGNOSIS — I739 Peripheral vascular disease, unspecified: Secondary | ICD-10-CM | POA: Diagnosis not present

## 2023-04-04 DIAGNOSIS — I700 Atherosclerosis of aorta: Secondary | ICD-10-CM | POA: Diagnosis not present

## 2023-04-17 DIAGNOSIS — E785 Hyperlipidemia, unspecified: Secondary | ICD-10-CM | POA: Diagnosis not present

## 2023-04-17 DIAGNOSIS — Z131 Encounter for screening for diabetes mellitus: Secondary | ICD-10-CM | POA: Diagnosis not present

## 2023-05-04 DIAGNOSIS — I700 Atherosclerosis of aorta: Secondary | ICD-10-CM | POA: Diagnosis not present

## 2023-05-04 DIAGNOSIS — I739 Peripheral vascular disease, unspecified: Secondary | ICD-10-CM | POA: Diagnosis not present

## 2023-05-11 ENCOUNTER — Ambulatory Visit (HOSPITAL_BASED_OUTPATIENT_CLINIC_OR_DEPARTMENT_OTHER): Admission: EM | Admit: 2023-05-11 | Discharge: 2023-05-11

## 2023-05-11 ENCOUNTER — Inpatient Hospital Stay (HOSPITAL_COMMUNITY)
Admission: EM | Admit: 2023-05-11 | Discharge: 2023-05-15 | DRG: 253 | Disposition: A | Discharge status: Home / Self Care | Attending: Internal Medicine | Admitting: Internal Medicine

## 2023-05-11 ENCOUNTER — Other Ambulatory Visit: Payer: Self-pay

## 2023-05-11 ENCOUNTER — Emergency Department (HOSPITAL_COMMUNITY)

## 2023-05-11 ENCOUNTER — Encounter (HOSPITAL_COMMUNITY): Payer: Self-pay

## 2023-05-11 ENCOUNTER — Encounter (HOSPITAL_BASED_OUTPATIENT_CLINIC_OR_DEPARTMENT_OTHER): Payer: Self-pay | Admitting: Family Medicine

## 2023-05-11 DIAGNOSIS — D696 Thrombocytopenia, unspecified: Secondary | ICD-10-CM | POA: Insufficient documentation

## 2023-05-11 DIAGNOSIS — I10 Essential (primary) hypertension: Secondary | ICD-10-CM | POA: Diagnosis not present

## 2023-05-11 DIAGNOSIS — I998 Other disorder of circulatory system: Principal | ICD-10-CM

## 2023-05-11 DIAGNOSIS — Z95828 Presence of other vascular implants and grafts: Secondary | ICD-10-CM | POA: Diagnosis not present

## 2023-05-11 DIAGNOSIS — D72829 Elevated white blood cell count, unspecified: Secondary | ICD-10-CM | POA: Diagnosis not present

## 2023-05-11 DIAGNOSIS — Z87891 Personal history of nicotine dependence: Secondary | ICD-10-CM

## 2023-05-11 DIAGNOSIS — T82392A Other mechanical complication of femoral arterial graft (bypass), initial encounter: Principal | ICD-10-CM | POA: Diagnosis present

## 2023-05-11 DIAGNOSIS — R000 Tachycardia, unspecified: Secondary | ICD-10-CM | POA: Diagnosis not present

## 2023-05-11 DIAGNOSIS — R509 Fever, unspecified: Secondary | ICD-10-CM | POA: Diagnosis not present

## 2023-05-11 DIAGNOSIS — Z7982 Long term (current) use of aspirin: Secondary | ICD-10-CM

## 2023-05-11 DIAGNOSIS — I70221 Atherosclerosis of native arteries of extremities with rest pain, right leg: Secondary | ICD-10-CM | POA: Diagnosis present

## 2023-05-11 DIAGNOSIS — L97519 Non-pressure chronic ulcer of other part of right foot with unspecified severity: Secondary | ICD-10-CM | POA: Diagnosis present

## 2023-05-11 DIAGNOSIS — I739 Peripheral vascular disease, unspecified: Secondary | ICD-10-CM | POA: Diagnosis present

## 2023-05-11 DIAGNOSIS — Z743 Need for continuous supervision: Secondary | ICD-10-CM | POA: Diagnosis not present

## 2023-05-11 DIAGNOSIS — M79604 Pain in right leg: Secondary | ICD-10-CM

## 2023-05-11 DIAGNOSIS — Z603 Acculturation difficulty: Secondary | ICD-10-CM | POA: Diagnosis present

## 2023-05-11 DIAGNOSIS — Z79899 Other long term (current) drug therapy: Secondary | ICD-10-CM

## 2023-05-11 DIAGNOSIS — K551 Chronic vascular disorders of intestine: Secondary | ICD-10-CM | POA: Diagnosis not present

## 2023-05-11 DIAGNOSIS — K5900 Constipation, unspecified: Secondary | ICD-10-CM | POA: Diagnosis not present

## 2023-05-11 DIAGNOSIS — Y832 Surgical operation with anastomosis, bypass or graft as the cause of abnormal reaction of the patient, or of later complication, without mention of misadventure at the time of the procedure: Secondary | ICD-10-CM | POA: Diagnosis present

## 2023-05-11 DIAGNOSIS — Z880 Allergy status to penicillin: Secondary | ICD-10-CM

## 2023-05-11 DIAGNOSIS — K5730 Diverticulosis of large intestine without perforation or abscess without bleeding: Secondary | ICD-10-CM | POA: Diagnosis not present

## 2023-05-11 DIAGNOSIS — D62 Acute posthemorrhagic anemia: Secondary | ICD-10-CM | POA: Diagnosis not present

## 2023-05-11 DIAGNOSIS — M79606 Pain in leg, unspecified: Principal | ICD-10-CM

## 2023-05-11 DIAGNOSIS — E785 Hyperlipidemia, unspecified: Secondary | ICD-10-CM | POA: Diagnosis present

## 2023-05-11 DIAGNOSIS — E782 Mixed hyperlipidemia: Secondary | ICD-10-CM | POA: Diagnosis present

## 2023-05-11 DIAGNOSIS — Z7983 Long term (current) use of bisphosphonates: Secondary | ICD-10-CM

## 2023-05-11 DIAGNOSIS — I700 Atherosclerosis of aorta: Secondary | ICD-10-CM | POA: Diagnosis not present

## 2023-05-11 DIAGNOSIS — Z8673 Personal history of transient ischemic attack (TIA), and cerebral infarction without residual deficits: Secondary | ICD-10-CM

## 2023-05-11 DIAGNOSIS — I70202 Unspecified atherosclerosis of native arteries of extremities, left leg: Secondary | ICD-10-CM | POA: Diagnosis present

## 2023-05-11 DIAGNOSIS — L97529 Non-pressure chronic ulcer of other part of left foot with unspecified severity: Secondary | ICD-10-CM | POA: Diagnosis present

## 2023-05-11 LAB — BASIC METABOLIC PANEL WITH GFR
Anion gap: 10 (ref 5–15)
BUN: 14 mg/dL (ref 8–23)
CO2: 21 mmol/L — ABNORMAL LOW (ref 22–32)
Calcium: 9.5 mg/dL (ref 8.9–10.3)
Chloride: 104 mmol/L (ref 98–111)
Creatinine, Ser: 0.89 mg/dL (ref 0.44–1.00)
GFR, Estimated: >60 mL/min (ref >60)
Glucose, Bld: 104 mg/dL — ABNORMAL HIGH (ref 70–99)
Potassium: 4.8 mmol/L (ref 3.5–5.1)
Sodium: 135 mmol/L (ref 135–145)

## 2023-05-11 LAB — CBC WITH DIFFERENTIAL/PLATELET
Abs Immature Granulocytes: 0.04 10*3/uL (ref 0.00–0.07)
Basophils Absolute: 0 10*3/uL (ref 0.0–0.1)
Basophils Relative: 0 %
Eosinophils Absolute: 0 10*3/uL (ref 0.0–0.5)
Eosinophils Relative: 0 %
HCT: 41.7 % (ref 36.0–46.0)
Hemoglobin: 13.9 g/dL (ref 12.0–15.0)
Immature Granulocytes: 1 %
Lymphocytes Relative: 26 %
Lymphs Abs: 1.9 10*3/uL (ref 0.7–4.0)
MCH: 31.3 pg (ref 26.0–34.0)
MCHC: 33.3 g/dL (ref 30.0–36.0)
MCV: 93.9 fL (ref 80.0–100.0)
Monocytes Absolute: 0.4 10*3/uL (ref 0.1–1.0)
Monocytes Relative: 5 %
Neutro Abs: 4.8 10*3/uL (ref 1.7–7.7)
Neutrophils Relative %: 68 %
Platelets: 101 10*3/uL — ABNORMAL LOW (ref 150–400)
RBC: 4.44 MIL/uL (ref 3.87–5.11)
RDW: 12.3 % (ref 11.5–15.5)
WBC: 7.1 10*3/uL (ref 4.0–10.5)
nRBC: 0 % (ref 0.0–0.2)

## 2023-05-11 LAB — PROTIME-INR
INR: 1.1 (ref 0.8–1.2)
Prothrombin Time: 14.7 s (ref 11.4–15.2)

## 2023-05-11 LAB — APTT: aPTT: 25 s (ref 24–36)

## 2023-05-11 MED ORDER — HEPARIN (PORCINE) 25000 UT/250ML-% IV SOLN
850.0000 [IU]/h | INTRAVENOUS | Status: DC
  Administered 2023-05-12: 850 [IU]/h via INTRAVENOUS
  Filled 2023-05-11: qty 250

## 2023-05-11 MED ORDER — HEPARIN BOLUS VIA INFUSION
3000.0000 [IU] | Freq: Once | INTRAVENOUS | Status: AC
  Administered 2023-05-12: 3000 [IU] via INTRAVENOUS
  Filled 2023-05-11: qty 3000

## 2023-05-11 MED ORDER — HYDROMORPHONE HCL 1 MG/ML IJ SOLN
1.0000 mg | Freq: Once | INTRAMUSCULAR | Status: AC
  Administered 2023-05-11: 1 mg via INTRAVENOUS
  Filled 2023-05-11: qty 1

## 2023-05-11 MED ORDER — IOHEXOL 350 MG/ML SOLN
100.0000 mL | Freq: Once | INTRAVENOUS | Status: AC | PRN
  Administered 2023-05-11: 100 mL via INTRAVENOUS

## 2023-05-11 NOTE — ED Notes (Signed)
 Patient is being discharged from the Urgent Care and sent to the Emergency Department via EMS . Per Guss Legacy FNP, patient is in need of higher level of care due to severe right leg pain and hypertensive crisis.. Patient is aware and verbalizes understanding of plan of care.  Vitals:   05/11/23 2001 05/11/23 2019  BP: (!) 220/100 (!) 212/91  Pulse: 95   Resp: 18   Temp: 98.2 F (36.8 C)   SpO2: 96%

## 2023-05-11 NOTE — Consult Note (Signed)
 [facility] [place]  ASSESSMENT / PLAN: 68 y.o. female with [place] 1 right lower extremity acute limb ischemia.  She has a complex vascular history (see below for details).  Agree with initiating a heparin  infusion in the ER.  Will check a CT angiogram of the abdomen pelvis with runoff to the toes.  Okay to eat now.  N.p.o. at midnight.  Anticipate either endovascular or open intervention tomorrow to treat acute limb ischemia.  Recommend discussion with internal medicine to consider admission to the hospital.  CHIEF COMPLAINT: Right foot pain  HISTORY OF PRESENT ILLNESS: Katherine Archer is a 68 y.o. female with past vascular history of right axillobifemoral bypass (11/11/2019); right femoral endarterectomy, right femoral above-knee popliteal artery bypass graft, right common femoral and profunda femoris thrombectomy (01/26/2022).  She was last seen in April 2024 and was doing well at that time.  This morning, the patient was awoken from sleep with pain in the right foot.  Pain is persistent throughout the day.  Pain is making it difficult to ambulate.  The patient presented for further care and was directed to the ER.  The patient has motor and sensory function of the foot.  She has been holding her aspirin  in anticipation of an endoscopy.  Past Medical History:  Diagnosis Date   Cervical polyp 09/18/2018   Critical lower limb ischemia (HCC)    Encounter for screening mammogram for breast cancer 05/05/2016   Fibrocystic breast changes of both breasts 04/29/2015   Hyperlipidemia    Hypertension    PAD (peripheral artery disease) (HCC) 08/23/2019   Stroke (HCC) 08/23/2019    Past Surgical History:  Procedure Laterality Date   ABDOMINAL AORTOGRAM W/LOWER EXTREMITY Bilateral 08/23/2019   Procedure: ABDOMINAL AORTOGRAM W/ Bilateral LOWER EXTREMITY Runoff;  Surgeon: Richrd Char, MD;  Location: [facility];  Service: Cardiovascular;  Laterality: Bilateral;    AXILLARY-FEMORAL BYPASS GRAFT Right 11/11/2019   Procedure: BYPASS GRAFT RIGHT AXILLA-BIFEMORAL;  Surgeon: Richrd Char, MD;  [facility];  Service: Vascular;  Laterality: Right;   BRAIN SURGERY     BREAST SURGERY Bilateral 2014   1980 R breast surgery   CEREBRAL ANEURYSM REPAIR     ENDARTERECTOMY FEMORAL Bilateral 11/11/2019   Procedure: ENDARTERECTOMY FEMORAL BILATERALLY;  Surgeon: Richrd Char, MD;  Location[facility];  Service: Vascular;  Laterality: Bilateral;   ENDARTERECTOMY FEMORAL Right 01/26/2022   Procedure: REDO RIGHT COMMON FEMORAL ENDARTERECTOMY USING 0.8CM X 7.6CM HEMASHIELD PLATINUM FINESSE PATCH;  Surgeon: Margherita Shell, MD;  Location[facility];  Service: Vascular;  Laterality: Right;   FEMORAL-POPLITEAL BYPASS GRAFT Right 01/26/2022   Procedure: RIGHT FEMORAL-POPLITEAL BYPASS GRAFT USING GORE X 80CM PROPATEN GRAFT;  Surgeon: Margherita Shell, MD;  [facility];  Service: Vascular;  Laterality: Right;   IR RADIOLOGIST EVAL & MGMT  04/02/2020   IR RADIOLOGIST EVAL & MGMT  03/29/2021   THROMBECTOMY FEMORAL ARTERY Right 01/26/2022   Procedure: THROMBECTOMY RIGHT FEMORAL ARTERY;  Surgeon: Margherita Shell, MD;  [facility];  Service: Vascular;  Laterality: Right;   TUBAL LIGATION  1990    Family History  Problem Relation Age of Onset   Heart Problems Father    Heart Problems Brother    Heart Problems Brother     Social History   Socioeconomic History   Marital status: Divorced    Spouse name: Not on file   Number of children: Not on file   Years of  education: Not on file   Highest education level: Not on file  Occupational History   Not on file  Tobacco Use   Smoking status: Former    Current packs/day: 0.00    Types: Cigarettes    Quit date: 10/04/2019    Years since quitting: 3.6    Passive exposure: Never   Smokeless tobacco: Never  Vaping Use   Vaping status: Never Used  Substance and Sexual Activity   Alcohol  use: No   Drug use: No    Sexual activity: Not Currently  Other Topics Concern   Not on file  Social History Narrative   Not on file   Social Drivers of Health   Financial Resource Strain: Not on file  Food Insecurity: No Food Insecurity (01/27/2022)   Hunger Vital Sign    Worried About Running Out of Food in the Last Year: Never true    Ran Out of Food in the Last Year: Never true  Transportation Needs: No Transportation Needs (01/27/2022)   PRAPARE - Administrator, Civil Service (Medical): No    Lack of Transportation (Non-Medical): No  Physical Activity: Not on file  Stress: Not on file  Social Connections: Not on file  Intimate Partner Violence: Not At Risk (01/27/2022)   Humiliation, Afraid, Rape, and Kick questionnaire    Fear of Current or Ex-Partner: No    Emotionally Abused: No    Physically Abused: No    Sexually Abused: No    Allergies  Allergen Reactions   Amoxicillin Other (See Comments)    Skin infection on face   Penicillins Other (See Comments)    Shaky and Dizziness 20 years ago    Current Facility-Administered Medications  Medication Dose Route Frequency Provider Last Rate Last Admin   heparin  ADULT infusion 100 units/mL (25000 units/250mL)  850 Units/hr Intravenous Continuous Synthia Ewing, RPH       heparin  bolus via infusion 3,000 Units  3,000 Units Intravenous Once Synthia Ewing, RPH       HYDROmorphone  (DILAUDID ) injection 1 mg  1 mg Intravenous Once Almond Army, MD       Current Outpatient Medications  Medication Sig Dispense Refill   alendronate (FOSAMAX) 70 MG tablet Take 70 mg by mouth once a week.     aspirin  81 MG EC tablet Take 81 mg by mouth daily.     atorvastatin  (LIPITOR ) 40 MG tablet Take 40 mg by mouth.     cilostazol  (PLETAL ) 50 MG tablet Take 1 tablet (50 mg total) by mouth 2 (two) times daily. 180 tablet 3   ezetimibe  (ZETIA ) 10 MG tablet Take 10 mg by mouth daily.     hydrALAZINE  (APRESOLINE ) 50 MG tablet Take 1 tablet (50 mg  total) by mouth 3 (three) times daily. (Patient taking differently: Take 50 mg by mouth in the morning and at bedtime.) 270 tablet 3   hydrochlorothiazide  (HYDRODIURIL ) 25 MG tablet Take 25 mg by mouth daily.     losartan (COZAAR) 25 MG tablet Take 25 mg by mouth.     rosuvastatin  (CRESTOR ) 40 MG tablet Take 40 mg by mouth daily.     TIADYLT  ER 180 MG 24 hr capsule Take 180 mg by mouth daily.     valsartan (DIOVAN) 80 MG tablet Take 80 mg by mouth daily.      PHYSICAL EXAM Vitals:   05/11/23 2140 05/11/23 2149  BP: (!) 206/81   Pulse: 88   Resp: (!) 22  Temp: 98.6 F (37 C)   TempSrc: Oral   SpO2: 98%   Weight:  58.1 kg  Height:  5\' 6"  (1.676 m)   Chronically ill-appearing woman in no distress Regular rate and rhythm Unlabored breathing Right axillary incision is healed with palpable bypass graft with pulse Bilateral common femoral arteries and bypass stenoses are palpable with 2+ pulses Left foot is warm and well-perfused No Doppler flow in right dorsalis pedis, posterior tibial, or peroneal artery  PERTINENT LABORATORY AND RADIOLOGIC DATA  Most recent CBC    Latest Ref Rng & Units 01/28/2022    2:06 AM 01/27/2022    4:03 AM 01/24/2022    3:30 PM  CBC  WBC 4.0 - 10.5 K/uL 8.4  9.6  5.9   Hemoglobin 12.0 - 15.0 g/dL 9.5  16.1  09.6   Hematocrit 36.0 - 46.0 % 29.3  29.1  45.5   Platelets 150 - 400 K/uL 83  90  114      Most recent CMP    Latest Ref Rng & Units 01/27/2022    4:03 AM 01/24/2022    3:30 PM 01/15/2020   11:28 AM  CMP  Glucose 70 - 99 mg/dL 045  90  98   BUN 8 - 23 mg/dL 11  9  8    Creatinine 0.44 - 1.00 mg/dL 4.09  8.11  9.14   Sodium 135 - 145 mmol/L 138  140  138   Potassium 3.5 - 5.1 mmol/L 3.7  4.3  4.3   Chloride 98 - 111 mmol/L 106  107  100   CO2 22 - 32 mmol/L 23  24  22    Calcium  8.9 - 10.3 mg/dL 8.7  9.5  78.2   Total Protein 6.5 - 8.1 g/dL  7.9    Total Bilirubin 0.3 - 1.2 mg/dL  0.6    Alkaline Phos 38 - 126 U/L  83    AST 15 - 41 U/L   20    ALT 0 - 44 U/L  17      Renal function CrCl cannot be calculated (Patient's most recent lab result is older than the maximum 21 days allowed.).  Hgb A1c MFr Bld (%)  Date Value  08/24/2019 5.3    LDL Cholesterol  Date Value Ref Range Status  01/27/2022 85 0 - 99 mg/dL Final    Comment:           Total Cholesterol/HDL:CHD Risk Coronary Heart Disease Risk Table                     Men   Women  1/2 Average Risk   3.4   3.3  Average Risk       5.0   4.4  2 X Average Risk   9.6   7.1  3 X Average Risk  23.4   11.0        Use the calculated Patient Ratio above and the CHD Risk Table to determine the patient's CHD Risk.        ATP III CLASSIFICATION (LDL):  <100     mg/dL   Optimal  956-213  mg/dL   Near or Above                    Optimal  130-159  mg/dL   Borderline  086-578  mg/dL   High  >469     mg/dL   Very High Performed at [facility], [address] [place], [place] [address]     Heber Little. Edgardo Goodwill, MD FACS [facility] Phone Number: [phone] 05/11/2023 10:43 PM   Total time spent on preparing this encounter including chart review, data review, collecting history, examining the patient, and coordinating care: inpatient consult - high complexity - 80 minutes (CPT 99255)  Portions of this report may have been transcribed using voice recognition software.  Every effort has been made to ensure accuracy; however, inadvertent computerized transcription errors may still be present.

## 2023-05-11 NOTE — ED Triage Notes (Signed)
 Patient presents with c/o RLE pain since this am. Patient's daughter states the patient had Fem pop bypass on the right leg in January 2024. Patient unable to bear weight on the right leg. Patient states it feels like pins and needles.

## 2023-05-11 NOTE — ED Provider Notes (Signed)
 [facility]    CSN: [id number] Arrival date & time: 05/11/23  1820      History   Chief Complaint Chief Complaint  Patient presents with   Leg Pain    HPI Katherine Archer is a 68 y.o. female.   The patient is here with her daughter.  The patient does not speak English but her daughter is translating for the patient.  The patient has had multiple (6 separate) surgeries to try to improve circulation in her right leg.  She woke up this morning with numbness tingling and severe pain of her right leg that was new.  The right leg felt cold to her.  She is reporting pain pain at 10 on a scale of 1-10.  She is crying in pain.  The history is limited by a language barrier (The patient does not speak [place], she only speaks [place].). Language interpreter used: Her daughter is translating for the patient..  Leg Pain Associated symptoms: no back pain and no fever     Past Medical History:  Diagnosis Date   Cervical polyp 09/18/2018   Critical lower limb ischemia (HCC)    Encounter for screening mammogram for breast cancer 05/05/2016   Fibrocystic breast changes of both breasts 04/29/2015   Hyperlipidemia    Hypertension    PAD (peripheral artery disease) (HCC) 08/23/2019   Stroke (HCC) 08/23/2019    Patient Active Problem List   Diagnosis Date Noted   Statin myopathy 06/15/2022   Critical limb ischemia of right lower extremity (HCC) 01/26/2022   Critical lower limb ischemia (HCC)    Coronary artery disease involving native coronary artery of native heart without angina pectoris 10/03/2019   Essential hypertension 10/03/2019   Mixed hyperlipidemia 10/03/2019   Hyperlipidemia    Hypertension    Stroke (HCC) 08/23/2019   PAD (peripheral artery disease) (HCC) 08/23/2019   Cervical polyp 09/18/2018   Encounter for screening mammogram for breast cancer 05/05/2016   Fibrocystic breast changes of both breasts 04/29/2015    Past Surgical History:  Procedure Laterality Date    ABDOMINAL AORTOGRAM W/LOWER EXTREMITY Bilateral 08/23/2019   Procedure: ABDOMINAL AORTOGRAM W/ Bilateral LOWER EXTREMITY [place];  Surgeon: Richrd Char, MD;  Location: [facility];  Service: Cardiovascular;  Laterality: Bilateral;   AXILLARY-FEMORAL BYPASS GRAFT Right 11/11/2019   Procedure: BYPASS GRAFT RIGHT AXILLA-BIFEMORAL;  Surgeon: Richrd Char, MD;  Location: [facility] OR;  Service: Vascular;  Laterality: Right;   BRAIN SURGERY     BREAST SURGERY Bilateral 2014   1980 R breast surgery   CEREBRAL ANEURYSM REPAIR     ENDARTERECTOMY FEMORAL Bilateral 11/11/2019   Procedure: ENDARTERECTOMY FEMORAL BILATERALLY;  Surgeon: Richrd Char, MD;  Location: [facility] OR;  Service: Vascular;  Laterality: Bilateral;   ENDARTERECTOMY FEMORAL Right 01/26/2022   Procedure: REDO RIGHT COMMON FEMORAL ENDARTERECTOMY USING 0.8CM X 7.6CM HEMASHIELD PLATINUM FINESSE PATCH;  Surgeon: Margherita Shell, MD;  Location: [facility] OR;  Service: Vascular;  Laterality: Right;   FEMORAL-POPLITEAL BYPASS GRAFT Right 01/26/2022   Procedure: RIGHT FEMORAL-POPLITEAL BYPASS GRAFT USING GORE X 80CM PROPATEN GRAFT;  Surgeon: Margherita Shell, MD;  Location: [facility] OR;  Service: Vascular;  Laterality: Right;   IR RADIOLOGIST EVAL & MGMT  04/02/2020   IR RADIOLOGIST EVAL & MGMT  03/29/2021   THROMBECTOMY FEMORAL ARTERY Right 01/26/2022   Procedure: THROMBECTOMY RIGHT FEMORAL ARTERY;  Surgeon: Margherita Shell, MD;  Location: [facility] OR;  Service: Vascular;  Laterality: Right;   TUBAL  LIGATION  1990    OB History   No obstetric history on file.      Home Medications    Prior to Admission medications   Medication Sig Start Date End Date Taking? Authorizing Provider  atorvastatin  (LIPITOR ) 40 MG tablet Take 40 mg by mouth. 06/11/19  Yes [provider]  losartan (COZAAR) 25 MG tablet Take 25 mg by mouth. 07/19/21  Yes [provider]  alendronate (FOSAMAX) 70 MG tablet Take 70 mg by mouth once a week. 12/28/20    [provider]  aspirin  81 MG EC tablet Take 81 mg by mouth daily.    [provider]  cilostazol  (PLETAL ) 50 MG tablet Take 1 tablet (50 mg total) by mouth 2 (two) times daily. 03/23/21   Tobb, Kardie, DO  ezetimibe  (ZETIA ) 10 MG tablet Take 10 mg by mouth daily. 11/18/21   [provider]  hydrALAZINE  (APRESOLINE ) 50 MG tablet Take 1 tablet (50 mg total) by mouth 3 (three) times daily. Patient taking differently: Take 50 mg by mouth in the morning and at bedtime. 04/08/20 01/26/22  Tobb, Kardie, DO  hydrochlorothiazide  (HYDRODIURIL ) 25 MG tablet Take 25 mg by mouth daily. 01/19/21   [provider]  rosuvastatin  (CRESTOR ) 40 MG tablet Take 40 mg by mouth daily. 11/21/21   [provider]  TIADYLT  ER 180 MG 24 hr capsule Take 180 mg by mouth daily.    [provider]  valsartan (DIOVAN) 80 MG tablet Take 80 mg by mouth daily. 11/18/21   [provider]    Family History Family History  Problem Relation Age of Onset   Heart Problems Father    Heart Problems Brother    Heart Problems Brother     Social History Social History   Tobacco Use   Smoking status: Former    Current packs/day: 0.00    Types: Cigarettes    Quit date: 10/04/2019    Years since quitting: 3.6    Passive exposure: Never   Smokeless tobacco: Never  Vaping Use   Vaping status: Never Used  Substance Use Topics   Alcohol  use: No   Drug use: No     Allergies   Amoxicillin and Penicillins   Review of Systems Review of Systems  Constitutional:  Negative for fever.  Respiratory:  Negative for cough.   Cardiovascular:  Negative for chest pain.  Gastrointestinal:  Negative for abdominal pain, constipation, diarrhea, nausea and vomiting.  Musculoskeletal:  Positive for myalgias (Right leg pain, numbness, tingling and the patient reports that the right leg feels cold.). Negative for arthralgias and back pain.  Skin:  Negative for color change and rash.   Neurological:  Negative for syncope.  All other systems reviewed and are negative.    Physical Exam Triage Vital Signs ED Triage Vitals  Encounter Vitals Group     BP 05/11/23 2001 (!) 220/100     Systolic BP Percentile --      Diastolic BP Percentile --      Pulse Rate 05/11/23 2001 95     Resp 05/11/23 2001 18     Temp 05/11/23 2001 98.2 F (36.8 C)     Temp Source 05/11/23 2001 Oral     SpO2 05/11/23 2001 96 %     Weight --      Height --      Head Circumference --      Peak Flow --      Pain Score 05/11/23 1959 10  Pain Loc --      Pain Education --      Exclude from Growth Chart --    No data found.  Updated Vital Signs BP (!) 212/91 (BP Location: Right Arm)   Pulse 95   Temp 98.2 F (36.8 C) (Oral)   Resp 18   SpO2 96%   Visual Acuity Right Eye Distance:   Left Eye Distance:   Bilateral Distance:    Right Eye Near:   Left Eye Near:    Bilateral Near:     Physical Exam Vitals and nursing note reviewed.  Constitutional:      General: She is not in acute distress.    Appearance: She is well-developed. She is not ill-appearing or toxic-appearing.  HENT:     Head: Normocephalic and atraumatic.     Right Ear: External ear normal.     Left Ear: External ear normal.     Nose: Nose normal.     Mouth/Throat:     Lips: Pink.     Mouth: Mucous membranes are moist.  Eyes:     Conjunctiva/sclera: Conjunctivae normal.     Pupils: Pupils are equal, round, and reactive to light.  Cardiovascular:     Rate and Rhythm: Normal rate and regular rhythm.     Heart sounds: S1 normal and S2 normal. No murmur heard. Pulmonary:     Effort: Pulmonary effort is normal. No respiratory distress.     Breath sounds: Normal breath sounds. No decreased breath sounds, wheezing, rhonchi or rales.  Musculoskeletal:        General: No swelling.     Comments: Patient would not allow me to assess her right lower leg.  When I touched it she cried out in pain.  When I tried to  unwrap her right foot to assess her foot she said it hurt too much.  She has an Ace bandage on the right foot.  The right leg was visible below her clothing from above the knee to the Ace bandage on the right foot.  The right leg is thin and she is a person of brown pigmentation.  I did not see any erythema and there were no wounds.  She did call out in pain when I tried to touch her leg 2 different times.  Skin:    General: Skin is warm and dry.     Capillary Refill: Capillary refill takes less than 2 seconds.     Findings: No rash.  Neurological:     Mental Status: She is alert and oriented to person, place, and time.  Psychiatric:        Mood and Affect: Mood normal.      UC Treatments / Results  Labs (all labs ordered are listed, but only abnormal results are displayed) Labs Reviewed - No data to display  EKG   Radiology No results found.  Procedures Procedures (including critical care time)  Medications Ordered in UC Medications - No data to display  Initial Impression / Assessment and Plan / UC Course  I have reviewed the triage vital signs and the nursing notes.  Pertinent labs & imaging results that were available during my care of the patient were reviewed by me and considered in my medical decision making (see chart for details).     Plan of Care: Hypertensive crisis with a history of stroke: Patient's blood pressure is 220/100.  She needs further care and management of this blood pressure.  Recheck blood pressure was  212/91. Severe right leg pain: Patient has a long history of vascular disease and has had 6 surgeries to try to improve circulation to the right leg.  She is having pain of 10 out of 10, reports numbness and tingling and that her right leg is cold.  Patient needs to go to an emergency room for further evaluation she may have lost circulation to the right leg.  Patient would not allow me to assess her leg due to pain.  Patient will go to an emergency  room via EMS.  Her family did not feel capable to put her in a car and drive her and they and I are both concerned about her blood pressure and any events during transport. Final Clinical Impressions(s) / UC Diagnoses   Final diagnoses:  Right leg pain  Hypertension, malignant     Discharge Instructions      The patient is having severe right leg pain and her blood pressure is 220/100.  She has a long history of vascular disease of her right leg with 6 surgeries to improve or revascularize the right leg.  She has a history of peripheral arterial disease and stroke.  She is crying in pain due to her right leg and she has a blood pressure that is very elevated.  Will transport to an ER via EMS.   ED Prescriptions   None    PDMP not reviewed this encounter.   Guss Legacy, FNP 05/11/23 2041

## 2023-05-11 NOTE — Discharge Instructions (Addendum)
 The patient is having severe right leg pain and her blood pressure is 220/100.  She has a long history of vascular disease of her right leg with 6 surgeries to improve or revascularize the right leg.  She has a history of peripheral arterial disease and stroke.  She is crying in pain due to her right leg and she has a blood pressure that is very elevated.  Will transport to an ER via EMS.

## 2023-05-11 NOTE — ED Notes (Signed)
 Patient transported to CT

## 2023-05-11 NOTE — Progress Notes (Signed)
 PHARMACY - ANTICOAGULATION CONSULT NOTE  Pharmacy Consult for heparin    Indication: ischemic limb   Allergies  Allergen Reactions   Amoxicillin Other (See Comments)    Skin infection on face   Penicillins Other (See Comments)    Shaky and Dizziness 20 years ago    Patient Measurements: Heparin  dosing weight 58.1kg     Vital Signs: Temp: 98.2 F (36.8 C) (05/08 2001) Temp Source: Oral (05/08 2001) BP: 212/91 (05/08 2019) Pulse Rate: 95 (05/08 2001)  Labs: No results for input(s): "HGB", "HCT", "PLT", "APTT", "LABPROT", "INR", "HEPARINUNFRC", "HEPRLOWMOCWT", "CREATININE", "CKTOTAL", "CKMB", "TROPONINIHS" in the last 72 hours.  CrCl cannot be calculated (Patient's most recent lab result is older than the maximum 21 days allowed.).   Medical History: Past Medical History:  Diagnosis Date   Cervical polyp 09/18/2018   Critical lower limb ischemia (HCC)    Encounter for screening mammogram for breast cancer 05/05/2016   Fibrocystic breast changes of both breasts 04/29/2015   Hyperlipidemia    Hypertension    PAD (peripheral artery disease) (HCC) 08/23/2019   Stroke (HCC) 08/23/2019   Assessment: Patient admitted for limb pain with extensive hx of limb ischemia requiring several interventions. Patient also hypertension on admission. CBC in process, last CBC 01/2022 was within normal limits. Not on anticoagulation PTA. Pharmacy consulted to dose heparin  gtt.   Goal of Therapy:  Heparin  level 0.3-0.7 units/ml Monitor platelets by anticoagulation protocol: Yes   Plan:  Give 3000 units bolus x 1 Start heparin  infusion at 850 units/hr Check anti-Xa level in 8 hours and daily while on heparin  Continue to monitor H&H and platelets F/u CBC to ensure HgB and PLTs are within normal limits.   Mamie Searles, PharmD, BCCCP  05/11/2023,9:40 PM

## 2023-05-11 NOTE — ED Triage Notes (Signed)
 Pt bib [facility] from UC. Pain to right leg, unable to bear weight, and is cold to the touch since today. EMS reports pulses palpable but weak. Pt has hx of hem pop bypass on right leg. EMS reports pt hypertensive. Pt has hx of stroke. EMS stroke screening negative. GCS 15.   EMS: 204/80 96% RA 106 HR 92 cbg 18 LAC, RAC  fentanyl 

## 2023-05-11 NOTE — ED Provider Notes (Signed)
 [place] EMERGENCY DEPARTMENT AT [facility] Provider Note   CSN: [id number] Arrival date & time: 05/11/23  2122     History  No chief complaint on file.   Katherine Archer is a 68 y.o. female.  Pt is a 68y/o female with hx of right axillary bifemoral bypass graft with Bilateral femoral endarterectomies  on 11/11/19 by Dr. Nolene Baumgarten for rest pain, known left SFA occlusion, s/p right common femoral endarterectomy with dacron patch angioplasty, right femoral to above knee popliteal artery bypass graft with PTFE, Redo right femoral artery exposure and thrombectomy of the right common femoral and profundofemoral arteries on 01/26/22 due to foot pain and non-healing ulcer with resolution of sx who is presenting today due to leg pain that started this morning.  She is reporting severe pain below the knee down to her foot.  She is also noticed her foot has been pale.  She reports she has been holding her aspirin  over the last 4 to 5 days because she was supposed to get an endoscopy tomorrow.  She initially went to urgent care where they noted that patient was unable to bear weight on her foot and it was cold to the touch.  She was also noted to be hypertensive.  Patient was sent here for further care.  Patient reports before today her leg was feeling well.  She denies any chest pain, shortness of breath, abdominal pain or other issues at this time.  The history is provided by the patient, medical records and the EMS personnel. The history is limited by a language barrier. A language interpreter was used Scientist, research (life sciences)).       Home Medications Prior to Admission medications   Medication Sig Start Date End Date Taking? Authorizing Provider  alendronate (FOSAMAX) 70 MG tablet Take 70 mg by mouth once a week. 12/28/20  Yes [provider]  aspirin  81 MG EC tablet Take 81 mg by mouth daily.   Yes [provider]  hydrALAZINE  (APRESOLINE ) 50 MG tablet Take 1 tablet (50 mg total) by mouth 3  (three) times daily. Patient taking differently: Take 25 mg by mouth 3 (three) times daily. 04/08/20 05/10/24 Yes Tobb, Kardie, DO  valsartan (DIOVAN) 160 MG tablet Take 160 mg by mouth daily. 11/18/21  Yes [provider]  ezetimibe  (ZETIA ) 10 MG tablet Take 10 mg by mouth daily. Patient not taking: Reported on 05/11/2023 11/18/21   [provider]  rosuvastatin  (CRESTOR ) 40 MG tablet Take 40 mg by mouth daily. Patient not taking: Reported on 05/11/2023 11/21/21   [provider]  TIADYLT  ER 180 MG 24 hr capsule Take 180 mg by mouth daily.    [provider]      Allergies    Amoxicillin and Penicillins    Review of Systems   Review of Systems  Physical Exam Updated Vital Signs BP (!) 206/81 (BP Location: Left Arm)   Pulse 88   Temp 98.6 F (37 C) (Oral)   Resp (!) 22   Ht 5\' 6"  (1.676 m)   Wt 58.1 kg   SpO2 98%   BMI 20.66 kg/m  Physical Exam Vitals and nursing note reviewed.  Constitutional:      General: She is in acute distress.     Appearance: She is well-developed.  HENT:     Head: Normocephalic and atraumatic.  Eyes:     Pupils: Pupils are equal, round, and reactive to light.  Cardiovascular:     Rate and Rhythm: Normal  rate and regular rhythm.     Heart sounds: Normal heart sounds. No murmur heard.    No friction rub.  Pulmonary:     Effort: Pulmonary effort is normal.     Breath sounds: Normal breath sounds. No wheezing or rales.  Abdominal:     General: Bowel sounds are normal. There is no distension.     Palpations: Abdomen is soft.     Tenderness: There is no abdominal tenderness. There is no guarding or rebound.  Musculoskeletal:        General: Tenderness present. Normal range of motion.     Comments: No edema.  Right lower extremity is cold to the touch, pale with no capillary refill.  There is a Doppler pulse at the popliteal that is faint.  No pulse noted at PT or DP areas on the right foot.  Strong palpable pulse present  in the right femoral  Skin:    General: Skin is warm and dry.     Findings: No rash.  Neurological:     Mental Status: She is alert and oriented to person, place, and time. Mental status is at baseline.     Cranial Nerves: No cranial nerve deficit.  Psychiatric:        Behavior: Behavior normal.     ED Results / Procedures / Treatments   Labs (all labs ordered are listed, but only abnormal results are displayed) Labs Reviewed  CBC WITH DIFFERENTIAL/PLATELET - Abnormal; Notable for the following components:      Result Value   Platelets 101 (*)    All other components within normal limits  BASIC METABOLIC PANEL WITH GFR - Abnormal; Notable for the following components:   CO2 21 (*)    Glucose, Bld 104 (*)    All other components within normal limits  APTT  PROTIME-INR  HEPARIN  LEVEL (UNFRACTIONATED)  CBC    EKG None  Radiology No results found.  Procedures Procedures    Medications Ordered in ED Medications  HYDROmorphone  (DILAUDID ) injection 1 mg (has no administration in time range)  heparin  bolus via infusion 3,000 Units (has no administration in time range)  heparin  ADULT infusion 100 units/mL (25000 units/250mL) (has no administration in time range)    ED Course/ Medical Decision Making/ A&P                                 Medical Decision Making Amount and/or Complexity of Data Reviewed Labs: ordered. Decision-making details documented in ED Course.  Risk Prescription drug management. Decision regarding hospitalization.   Pt with multiple medical problems and comorbidities and presenting today with a complaint that caries a high risk for morbidity and mortality.  Here today with findings most classic for ischemic right lower leg.  Concerned this may have been related to her holding her aspirin  for the last 4 to 5 days because she was was to get an endoscopy tomorrow.  No pulses present at the PT or DP areas.  Patient started on a heparin  drip, labs are  pending.  She was given pain control.  Consulted vascular surgery for further care who will see the patient.  Dr. Edgardo Goodwill has request hospitalist admission and waiting on CTA for case planning tomorrow.  Will take for OR or cath in the morning.  Pt states pain improved after dilaudid .  I independently interpreted patient's labs and CBC, BMP, coags all without acute findings.  Will admit  to the hospitalist service.  Patient and her family member are comfortable with this plan.  CRITICAL CARE Performed by: Sutton Hirsch Total critical care time: 30 minutes Critical care time was exclusive of separately billable procedures and treating other patients. Critical care was necessary to treat or prevent imminent or life-threatening deterioration. Critical care was time spent personally by me on the following activities: development of treatment plan with patient and/or surrogate as well as nursing, discussions with consultants, evaluation of patient's response to treatment, examination of patient, obtaining history from patient or surrogate, ordering and performing treatments and interventions, ordering and review of laboratory studies, ordering and review of radiographic studies, pulse oximetry and re-evaluation of patient's condition.          Final Clinical Impression(s) / ED Diagnoses Final diagnoses:  Ischemic rest pain of lower extremity    Rx / DC Orders ED Discharge Orders     None         Almond Army, MD 05/11/23 2313

## 2023-05-12 ENCOUNTER — Inpatient Hospital Stay (HOSPITAL_COMMUNITY): Admitting: Anesthesiology

## 2023-05-12 ENCOUNTER — Encounter (HOSPITAL_COMMUNITY): Payer: Self-pay | Admitting: Internal Medicine

## 2023-05-12 ENCOUNTER — Encounter (HOSPITAL_COMMUNITY)
Admission: EM | Disposition: A | Discharge status: Home / Self Care | Payer: Self-pay | Source: Home / Self Care | Attending: Internal Medicine

## 2023-05-12 ENCOUNTER — Other Ambulatory Visit: Payer: Self-pay

## 2023-05-12 ENCOUNTER — Inpatient Hospital Stay (HOSPITAL_COMMUNITY)

## 2023-05-12 DIAGNOSIS — K5900 Constipation, unspecified: Secondary | ICD-10-CM | POA: Diagnosis not present

## 2023-05-12 DIAGNOSIS — I70221 Atherosclerosis of native arteries of extremities with rest pain, right leg: Secondary | ICD-10-CM

## 2023-05-12 DIAGNOSIS — I639 Cerebral infarction, unspecified: Secondary | ICD-10-CM | POA: Diagnosis not present

## 2023-05-12 DIAGNOSIS — E785 Hyperlipidemia, unspecified: Secondary | ICD-10-CM

## 2023-05-12 DIAGNOSIS — I743 Embolism and thrombosis of arteries of the lower extremities: Secondary | ICD-10-CM

## 2023-05-12 DIAGNOSIS — I10 Essential (primary) hypertension: Secondary | ICD-10-CM

## 2023-05-12 DIAGNOSIS — I70201 Unspecified atherosclerosis of native arteries of extremities, right leg: Secondary | ICD-10-CM | POA: Diagnosis not present

## 2023-05-12 DIAGNOSIS — Z7982 Long term (current) use of aspirin: Secondary | ICD-10-CM | POA: Diagnosis not present

## 2023-05-12 DIAGNOSIS — R509 Fever, unspecified: Secondary | ICD-10-CM | POA: Diagnosis not present

## 2023-05-12 DIAGNOSIS — L97519 Non-pressure chronic ulcer of other part of right foot with unspecified severity: Secondary | ICD-10-CM | POA: Diagnosis not present

## 2023-05-12 DIAGNOSIS — I159 Secondary hypertension, unspecified: Secondary | ICD-10-CM

## 2023-05-12 DIAGNOSIS — L97529 Non-pressure chronic ulcer of other part of left foot with unspecified severity: Secondary | ICD-10-CM | POA: Diagnosis not present

## 2023-05-12 DIAGNOSIS — Z87891 Personal history of nicotine dependence: Secondary | ICD-10-CM | POA: Diagnosis not present

## 2023-05-12 DIAGNOSIS — D62 Acute posthemorrhagic anemia: Secondary | ICD-10-CM | POA: Diagnosis not present

## 2023-05-12 DIAGNOSIS — I998 Other disorder of circulatory system: Secondary | ICD-10-CM

## 2023-05-12 DIAGNOSIS — Z880 Allergy status to penicillin: Secondary | ICD-10-CM | POA: Diagnosis not present

## 2023-05-12 DIAGNOSIS — E782 Mixed hyperlipidemia: Secondary | ICD-10-CM | POA: Diagnosis not present

## 2023-05-12 DIAGNOSIS — Z79899 Other long term (current) drug therapy: Secondary | ICD-10-CM | POA: Diagnosis not present

## 2023-05-12 DIAGNOSIS — D696 Thrombocytopenia, unspecified: Secondary | ICD-10-CM | POA: Insufficient documentation

## 2023-05-12 DIAGNOSIS — I2510 Atherosclerotic heart disease of native coronary artery without angina pectoris: Secondary | ICD-10-CM | POA: Diagnosis not present

## 2023-05-12 DIAGNOSIS — S43001A Unspecified subluxation of right shoulder joint, initial encounter: Secondary | ICD-10-CM | POA: Diagnosis not present

## 2023-05-12 DIAGNOSIS — D72829 Elevated white blood cell count, unspecified: Secondary | ICD-10-CM | POA: Diagnosis not present

## 2023-05-12 DIAGNOSIS — I70202 Unspecified atherosclerosis of native arteries of extremities, left leg: Secondary | ICD-10-CM | POA: Diagnosis not present

## 2023-05-12 DIAGNOSIS — T82868A Thrombosis of vascular prosthetic devices, implants and grafts, initial encounter: Secondary | ICD-10-CM

## 2023-05-12 DIAGNOSIS — Y832 Surgical operation with anastomosis, bypass or graft as the cause of abnormal reaction of the patient, or of later complication, without mention of misadventure at the time of the procedure: Secondary | ICD-10-CM | POA: Diagnosis not present

## 2023-05-12 DIAGNOSIS — T82392A Other mechanical complication of femoral arterial graft (bypass), initial encounter: Secondary | ICD-10-CM | POA: Diagnosis not present

## 2023-05-12 DIAGNOSIS — Z603 Acculturation difficulty: Secondary | ICD-10-CM | POA: Diagnosis not present

## 2023-05-12 DIAGNOSIS — Z8673 Personal history of transient ischemic attack (TIA), and cerebral infarction without residual deficits: Secondary | ICD-10-CM | POA: Diagnosis not present

## 2023-05-12 DIAGNOSIS — M79606 Pain in leg, unspecified: Secondary | ICD-10-CM | POA: Diagnosis not present

## 2023-05-12 DIAGNOSIS — Z7983 Long term (current) use of bisphosphonates: Secondary | ICD-10-CM | POA: Diagnosis not present

## 2023-05-12 HISTORY — PX: APPLICATION, SKIN SUBSTITUTE: SHX7530

## 2023-05-12 HISTORY — PX: FEMORAL-POPLITEAL BYPASS GRAFT: SHX937

## 2023-05-12 HISTORY — PX: THROMBECTOMY OF BYPASS GRAFT FEMORAL- TIBIAL ARTERY: SHX6904

## 2023-05-12 LAB — CBC
HCT: 39.2 % (ref 36.0–46.0)
Hemoglobin: 13.2 g/dL (ref 12.0–15.0)
MCH: 31 pg (ref 26.0–34.0)
MCHC: 33.7 g/dL (ref 30.0–36.0)
MCV: 92 fL (ref 80.0–100.0)
Platelets: 109 10*3/uL — ABNORMAL LOW (ref 150–400)
RBC: 4.26 MIL/uL (ref 3.87–5.11)
RDW: 12.4 % (ref 11.5–15.5)
WBC: 5.9 10*3/uL (ref 4.0–10.5)
nRBC: 0 % (ref 0.0–0.2)

## 2023-05-12 LAB — BASIC METABOLIC PANEL WITH GFR
Anion gap: 10 (ref 5–15)
BUN: 12 mg/dL (ref 8–23)
CO2: 21 mmol/L — ABNORMAL LOW (ref 22–32)
Calcium: 9.5 mg/dL (ref 8.9–10.3)
Chloride: 107 mmol/L (ref 98–111)
Creatinine, Ser: 0.82 mg/dL (ref 0.44–1.00)
GFR, Estimated: >60 mL/min (ref >60)
Glucose, Bld: 102 mg/dL — ABNORMAL HIGH (ref 70–99)
Potassium: 3.9 mmol/L (ref 3.5–5.1)
Sodium: 138 mmol/L (ref 135–145)

## 2023-05-12 LAB — TYPE AND SCREEN
ABO/RH(D): O POS
Antibody Screen: NEGATIVE

## 2023-05-12 LAB — POCT ACTIVATED CLOTTING TIME
Activated Clotting Time: 135 s
Activated Clotting Time: 233 s
Activated Clotting Time: 228 s
Activated Clotting Time: 239 s

## 2023-05-12 LAB — SURGICAL PCR SCREEN
MRSA, PCR: NEGATIVE
Staphylococcus aureus: NEGATIVE

## 2023-05-12 LAB — HIV ANTIBODY (ROUTINE TESTING W REFLEX): HIV Screen 4th Generation wRfx: NONREACTIVE

## 2023-05-12 LAB — HEPARIN LEVEL (UNFRACTIONATED): Heparin Unfractionated: 0.41 [IU]/mL (ref 0.30–0.70)

## 2023-05-12 SURGERY — THROMBECTOMY, BYPASS GRAFT, ARTERIAL, FEMORAL TO TIBIAL
Anesthesia: General | Site: Leg Upper | Laterality: Right

## 2023-05-12 MED ORDER — FENTANYL CITRATE (PF) 250 MCG/5ML IJ SOLN
INTRAMUSCULAR | Status: DC | PRN
  Administered 2023-05-12 (×5): 50 ug via INTRAVENOUS

## 2023-05-12 MED ORDER — 0.9 % SODIUM CHLORIDE (POUR BTL) OPTIME
TOPICAL | Status: DC | PRN
  Administered 2023-05-12 (×2): 1000 mL

## 2023-05-12 MED ORDER — HEPARIN SODIUM (PORCINE) 1000 UNIT/ML IJ SOLN
INTRAMUSCULAR | Status: DC | PRN
  Administered 2023-05-12: 2000 [IU] via INTRAVENOUS
  Administered 2023-05-12: 1000 [IU] via INTRAVENOUS
  Administered 2023-05-12: 5000 [IU] via INTRAVENOUS

## 2023-05-12 MED ORDER — SUGAMMADEX SODIUM 200 MG/2ML IV SOLN
INTRAVENOUS | Status: DC | PRN
  Administered 2023-05-12: 110 mg via INTRAVENOUS

## 2023-05-12 MED ORDER — SODIUM CHLORIDE 0.9 % IV SOLN: 500.0000 mL | Freq: Once | INTRAVENOUS | Status: DC | PRN

## 2023-05-12 MED ORDER — CEFAZOLIN SODIUM 1 G IJ SOLR
INTRAMUSCULAR | Status: AC
Start: 2023-05-12 — End: ?
  Filled 2023-05-12: qty 20

## 2023-05-12 MED ORDER — POLYETHYLENE GLYCOL 3350 17 G PO PACK: 17.0000 g | PACK | Freq: Every day | ORAL | Status: DC | PRN

## 2023-05-12 MED ORDER — ACETAMINOPHEN 325 MG PO TABS: 325.0000 mg | ORAL_TABLET | ORAL | Status: DC | PRN

## 2023-05-12 MED ORDER — CEFAZOLIN SODIUM-DEXTROSE 2-3 GM-%(50ML) IV SOLR
INTRAVENOUS | Status: DC | PRN
  Administered 2023-05-12: 2 g via INTRAVENOUS

## 2023-05-12 MED ORDER — PROPOFOL 500 MG/50ML IV EMUL
INTRAVENOUS | Status: DC | PRN
  Administered 2023-05-12: 70 ug/kg/min via INTRAVENOUS

## 2023-05-12 MED ORDER — ROCURONIUM BROMIDE 10 MG/ML (PF) SYRINGE
PREFILLED_SYRINGE | INTRAVENOUS | Status: AC
  Filled 2023-05-12: qty 10

## 2023-05-12 MED ORDER — CHLORHEXIDINE GLUCONATE 0.12 % MT SOLN
15.0000 mL | Freq: Once | OROMUCOSAL | Status: AC
  Administered 2023-05-12: 15 mL via OROMUCOSAL
  Filled 2023-05-12: qty 15

## 2023-05-12 MED ORDER — PROPOFOL 1000 MG/100ML IV EMUL
INTRAVENOUS | Status: AC
  Filled 2023-05-12: qty 100

## 2023-05-12 MED ORDER — MORPHINE SULFATE (PF) 4 MG/ML IV SOLN: 4.0000 mg | INTRAVENOUS | Status: DC | PRN

## 2023-05-12 MED ORDER — METOPROLOL TARTRATE 5 MG/5ML IV SOLN: 2.0000 mg | INTRAVENOUS | Status: DC | PRN

## 2023-05-12 MED ORDER — ALUM & MAG HYDROXIDE-SIMETH 200-200-20 MG/5ML PO SUSP: 15.0000 mL | ORAL | Status: DC | PRN

## 2023-05-12 MED ORDER — SODIUM CHLORIDE 0.9 % IV SOLN: INTRAVENOUS | Status: DC

## 2023-05-12 MED ORDER — PROPOFOL 10 MG/ML IV BOLUS
INTRAVENOUS | Status: DC | PRN
  Administered 2023-05-12: 30 mg via INTRAVENOUS
  Administered 2023-05-12: 60 mg via INTRAVENOUS
  Administered 2023-05-12: 40 mg via INTRAVENOUS
  Administered 2023-05-12 (×2): 10 mg via INTRAVENOUS

## 2023-05-12 MED ORDER — FENTANYL CITRATE (PF) 250 MCG/5ML IJ SOLN
INTRAMUSCULAR | Status: AC
Start: 2023-05-12 — End: ?
  Filled 2023-05-12: qty 5

## 2023-05-12 MED ORDER — ACETAMINOPHEN 650 MG RE SUPP: 325.0000 mg | RECTAL | Status: DC | PRN

## 2023-05-12 MED ORDER — CEFAZOLIN SODIUM-DEXTROSE 2-4 GM/100ML-% IV SOLN
2.0000 g | Freq: Three times a day (TID) | INTRAVENOUS | Status: AC
  Administered 2023-05-12 – 2023-05-13 (×2): 2 g via INTRAVENOUS
  Filled 2023-05-12 (×2): qty 100

## 2023-05-12 MED ORDER — DOCUSATE SODIUM 100 MG PO CAPS
100.0000 mg | ORAL_CAPSULE | Freq: Every day | ORAL | Status: DC
  Administered 2023-05-13 – 2023-05-15 (×3): 100 mg via ORAL
  Filled 2023-05-12 (×3): qty 1

## 2023-05-12 MED ORDER — HEPARIN 6000 UNIT IRRIGATION SOLUTION
Status: DC | PRN
  Administered 2023-05-12: 1

## 2023-05-12 MED ORDER — ONDANSETRON HCL 4 MG/2ML IJ SOLN
INTRAMUSCULAR | Status: DC | PRN
  Administered 2023-05-12: 4 mg via INTRAVENOUS

## 2023-05-12 MED ORDER — PROTAMINE SULFATE 10 MG/ML IV SOLN
INTRAVENOUS | Status: DC | PRN
  Administered 2023-05-12 (×2): 20 mg via INTRAVENOUS
  Administered 2023-05-12: 10 mg via INTRAVENOUS

## 2023-05-12 MED ORDER — LABETALOL HCL 5 MG/ML IV SOLN: 10.0000 mg | INTRAVENOUS | Status: DC | PRN

## 2023-05-12 MED ORDER — PHENYLEPHRINE HCL-NACL 20-0.9 MG/250ML-% IV SOLN
INTRAVENOUS | Status: DC | PRN
  Administered 2023-05-12: 50 ug/min via INTRAVENOUS

## 2023-05-12 MED ORDER — HYDROMORPHONE HCL 1 MG/ML IJ SOLN
INTRAMUSCULAR | Status: AC
  Filled 2023-05-12: qty 0.5

## 2023-05-12 MED ORDER — ONDANSETRON HCL 4 MG/2ML IJ SOLN: 4.0000 mg | Freq: Four times a day (QID) | INTRAMUSCULAR | Status: DC | PRN

## 2023-05-12 MED ORDER — LIDOCAINE 2% (20 MG/ML) 5 ML SYRINGE
INTRAMUSCULAR | Status: DC | PRN
  Administered 2023-05-12: 40 mg via INTRAVENOUS

## 2023-05-12 MED ORDER — HYDROMORPHONE HCL 1 MG/ML IJ SOLN
INTRAMUSCULAR | Status: DC | PRN
  Administered 2023-05-12: .5 mg via INTRAVENOUS

## 2023-05-12 MED ORDER — SURGIFLO WITH THROMBIN (HEMOSTATIC MATRIX KIT) OPTIME
TOPICAL | Status: DC | PRN
  Administered 2023-05-12 (×2): 1 via TOPICAL

## 2023-05-12 MED ORDER — ROCURONIUM BROMIDE 10 MG/ML (PF) SYRINGE
PREFILLED_SYRINGE | INTRAVENOUS | Status: DC | PRN
  Administered 2023-05-12 (×3): 20 mg via INTRAVENOUS
  Administered 2023-05-12: 60 mg via INTRAVENOUS

## 2023-05-12 MED ORDER — GUAIFENESIN-DM 100-10 MG/5ML PO SYRP: 15.0000 mL | ORAL_SOLUTION | ORAL | Status: DC | PRN

## 2023-05-12 MED ORDER — DEXAMETHASONE SODIUM PHOSPHATE 10 MG/ML IJ SOLN
INTRAMUSCULAR | Status: AC
  Filled 2023-05-12: qty 1

## 2023-05-12 MED ORDER — HYDRALAZINE HCL 20 MG/ML IJ SOLN: 10.0000 mg | INTRAMUSCULAR | Status: DC | PRN

## 2023-05-12 MED ORDER — LACTATED RINGERS IV SOLN: INTRAVENOUS | Status: DC

## 2023-05-12 MED ORDER — PROPOFOL 10 MG/ML IV BOLUS
INTRAVENOUS | Status: AC
  Filled 2023-05-12: qty 20

## 2023-05-12 MED ORDER — PANTOPRAZOLE SODIUM 40 MG PO TBEC
40.0000 mg | DELAYED_RELEASE_TABLET | Freq: Every day | ORAL | Status: DC
  Administered 2023-05-12 – 2023-05-15 (×4): 40 mg via ORAL
  Filled 2023-05-12 (×4): qty 1

## 2023-05-12 MED ORDER — OXYCODONE-ACETAMINOPHEN 5-325 MG PO TABS
1.0000 | ORAL_TABLET | ORAL | Status: DC | PRN
  Administered 2023-05-13: 1 via ORAL
  Filled 2023-05-12: qty 1

## 2023-05-12 MED ORDER — ORAL CARE MOUTH RINSE: 15.0000 mL | Freq: Once | OROMUCOSAL | Status: AC

## 2023-05-12 MED ORDER — DEXMEDETOMIDINE HCL IN NACL 80 MCG/20ML IV SOLN
INTRAVENOUS | Status: AC
  Filled 2023-05-12: qty 20

## 2023-05-12 MED ORDER — PHENOL 1.4 % MT LIQD: 1.0000 | OROMUCOSAL | Status: DC | PRN

## 2023-05-12 MED ORDER — HEPARIN (PORCINE) 25000 UT/250ML-% IV SOLN
900.0000 [IU]/h | INTRAVENOUS | Status: DC
Start: 2023-05-12 — End: 2023-05-15
  Administered 2023-05-12: 500 [IU]/h via INTRAVENOUS
  Filled 2023-05-12 (×2): qty 250

## 2023-05-12 MED ORDER — DEXMEDETOMIDINE HCL IN NACL 80 MCG/20ML IV SOLN
INTRAVENOUS | Status: DC | PRN
  Administered 2023-05-12 (×3): 4 ug via INTRAVENOUS

## 2023-05-12 SURGICAL SUPPLY — 31 items
BAG COUNTER SPONGE SURGICOUNT (BAG) ×2 IMPLANT
CANISTER SUCTION 3000ML PPV (SUCTIONS) ×2 IMPLANT
CATH EMB 3FR 80 (CATHETERS) IMPLANT
CATH EMB 4FR 80 (CATHETERS) IMPLANT
CATH EMB 5FR 80CM (CATHETERS) IMPLANT
DERMABOND ADVANCED .7 DNX12 (GAUZE/BANDAGES/DRESSINGS) IMPLANT
ELECTRODE REM PT RTRN 9FT ADLT (ELECTROSURGICAL) ×2 IMPLANT
GAUZE SPONGE 4X4 12PLY STRL (GAUZE/BANDAGES/DRESSINGS) ×2 IMPLANT
GLOVE BIO SURGEON STRL SZ8 (GLOVE) ×2 IMPLANT
GOWN STRL REUS W/ TWL LRG LVL3 (GOWN DISPOSABLE) ×4 IMPLANT
GOWN STRL REUS W/ TWL XL LVL3 (GOWN DISPOSABLE) ×2 IMPLANT
GRAFT PROPATEN W/RING 6X80X60 (Vascular Products) IMPLANT
GRAFT SKIN WND MICRO 38 (Tissue) IMPLANT
KIT BASIN OR (CUSTOM PROCEDURE TRAY) ×2 IMPLANT
KIT TURNOVER KIT B (KITS) ×2 IMPLANT
NS IRRIG 1000ML POUR BTL (IV SOLUTION) ×4 IMPLANT
PACK PERIPHERAL VASCULAR (CUSTOM PROCEDURE TRAY) ×2 IMPLANT
PAD ARMBOARD POSITIONER FOAM (MISCELLANEOUS) ×4 IMPLANT
STOPCOCK 4 WAY LG BORE MALE ST (IV SETS) IMPLANT
SURGIFLO W/THROMBIN 8M KIT (HEMOSTASIS) IMPLANT
SUT MNCRL AB 4-0 PS2 18 (SUTURE) ×4 IMPLANT
SUT PROLENE 5 0 C 1 24 (SUTURE) ×2 IMPLANT
SUT PROLENE 6 0 BV (SUTURE) ×2 IMPLANT
SUT SILK 2 0 SH (SUTURE) ×2 IMPLANT
SUT SILK 3-0 18XBRD TIE 12 (SUTURE) IMPLANT
SUT VIC AB 2-0 CT1 TAPERPNT 27 (SUTURE) ×4 IMPLANT
SUT VIC AB 3-0 SH 27X BRD (SUTURE) ×4 IMPLANT
TOWEL GREEN STERILE (TOWEL DISPOSABLE) ×2 IMPLANT
TRAY FOLEY MTR SLVR 16FR STAT (SET/KITS/TRAYS/PACK) ×2 IMPLANT
UNDERPAD 30X36 HEAVY ABSORB (UNDERPADS AND DIAPERS) ×2 IMPLANT
WATER STERILE IRR 1000ML POUR (IV SOLUTION) ×2 IMPLANT

## 2023-05-12 NOTE — Anesthesia Procedure Notes (Signed)
 Procedure Name: Intubation Date/Time: 05/12/2023 11:24 AM  Performed by: Claud Crumb, CRNAPre-anesthesia Checklist: Patient identified, Emergency Drugs available, Suction available and Patient being monitored Patient Re-evaluated:Patient Re-evaluated prior to induction Oxygen Delivery Method: Circle System Utilized Preoxygenation: Pre-oxygenation with 100% oxygen Induction Type: IV induction Ventilation: Mask ventilation without difficulty Laryngoscope Size: Mac and 3 Grade View: Grade I Tube type: Oral Tube size: 7.0 mm Number of attempts: 1 Airway Equipment and Method: Stylet and Oral airway Placement Confirmation: ETT inserted through vocal cords under direct vision, positive ETCO2 and breath sounds checked- equal and bilateral Secured at: 21 cm Tube secured with: Tape Dental Injury: Teeth and Oropharynx as per pre-operative assessment

## 2023-05-12 NOTE — H&P (Signed)
 History and Physical    [name]:[id number] DOB: [birth year] DOA: 05/11/2023  Patient coming from: Home.  Chief Complaint: Right foot pain.  Engineer, structural used.  HPI: Katherine Archer is a 68 y.o. female with past history of axillobifemoral bypass on 11/21, right femoral endarterectomy, right femoral to above-knee popliteal artery bypass graft, right common femoral and profunda femoris thrombectomy on 1/24 presents to the ER for complaints of persistent pain since yesterday morning after waking up.  Patient felt heaviness on the right lower extremity and pain which gradually worsened and persisted.  Patient decided to come to the ER.  Patient has been holding her aspirin  for last few days in anticipation of GI workup.  ED Course: In the ER on exam patient had cold right foot with poor pulses.  Vascular surgeon was consulted and plan is to admit patient for further workup for critical limb ischemia of the right lower extremity.  Labs show thrombocytopenia which appears to be chronic.  Patient was started on heparin  infusion.  Review of Systems: As per HPI, rest all negative.   Past Medical History:  Diagnosis Date   Cervical polyp 09/18/2018   Critical lower limb ischemia (HCC)    Encounter for screening mammogram for breast cancer 05/05/2016   Fibrocystic breast changes of both breasts 04/29/2015   Hyperlipidemia    Hypertension    PAD (peripheral artery disease) (HCC) 08/23/2019   Stroke (HCC) 08/23/2019    Past Surgical History:  Procedure Laterality Date   ABDOMINAL AORTOGRAM W/LOWER EXTREMITY Bilateral 08/23/2019   Procedure: ABDOMINAL AORTOGRAM W/ Bilateral LOWER EXTREMITY Runoff;  Surgeon: Richrd Char, MD;  Location: [facility];  Service: Cardiovascular;  Laterality: Bilateral;   AXILLARY-FEMORAL BYPASS GRAFT Right 11/11/2019   Procedure: BYPASS GRAFT RIGHT AXILLA-BIFEMORAL;  Surgeon: Richrd Char, MD;  Location: [facility] OR;  Service: Vascular;  Laterality: Right;    BRAIN SURGERY     BREAST SURGERY Bilateral 2014   1980 R breast surgery   CEREBRAL ANEURYSM REPAIR     ENDARTERECTOMY FEMORAL Bilateral 11/11/2019   Procedure: ENDARTERECTOMY FEMORAL BILATERALLY;  Surgeon: Richrd Char, MD;  Location: [facility] OR;  Service: Vascular;  Laterality: Bilateral;   ENDARTERECTOMY FEMORAL Right 01/26/2022   Procedure: REDO RIGHT COMMON FEMORAL ENDARTERECTOMY USING 0.8CM X 7.6CM HEMASHIELD PLATINUM FINESSE PATCH;  Surgeon: Margherita Shell, MD;  Location: [facility] OR;  Service: Vascular;  Laterality: Right;   FEMORAL-POPLITEAL BYPASS GRAFT Right 01/26/2022   Procedure: RIGHT FEMORAL-POPLITEAL BYPASS GRAFT USING GORE X 80CM PROPATEN GRAFT;  Surgeon: Margherita Shell, MD;  Location: [facility] OR;  Service: Vascular;  Laterality: Right;   IR RADIOLOGIST EVAL & MGMT  04/02/2020   IR RADIOLOGIST EVAL & MGMT  03/29/2021   THROMBECTOMY FEMORAL ARTERY Right 01/26/2022   Procedure: THROMBECTOMY RIGHT FEMORAL ARTERY;  Surgeon: Margherita Shell, MD;  Location: [facility] OR;  Service: Vascular;  Laterality: Right;   TUBAL LIGATION  1990     reports that she quit smoking about 3 years ago. Her smoking use included cigarettes. She has never been exposed to tobacco smoke. She has never used smokeless tobacco. She reports that she does not drink alcohol  and does not use drugs.  Allergies  Allergen Reactions   Amoxicillin Other (See Comments)    Skin infection on face   Penicillins Other (See Comments)    Shaky and Dizziness 20 years ago    Family History  Problem Relation Age of Onset   Heart Problems Father  Heart Problems Brother    Heart Problems Brother     Prior to Admission medications   Medication Sig Start Date End Date Taking? Authorizing Provider  alendronate (FOSAMAX) 70 MG tablet Take 70 mg by mouth once a week. 12/28/20  Yes [provider]  aspirin  81 MG EC tablet Take 81 mg by mouth daily.   Yes [provider]  hydrALAZINE  (APRESOLINE ) 50 MG tablet  Take 1 tablet (50 mg total) by mouth 3 (three) times daily. Patient taking differently: Take 25 mg by mouth 3 (three) times daily. 04/08/20 05/10/24 Yes Tobb, Kardie, DO  valsartan (DIOVAN) 160 MG tablet Take 160 mg by mouth daily. 11/18/21  Yes [provider]  ezetimibe  (ZETIA ) 10 MG tablet Take 10 mg by mouth daily. Patient not taking: Reported on 05/11/2023 11/18/21   [provider]  rosuvastatin  (CRESTOR ) 40 MG tablet Take 40 mg by mouth daily. Patient not taking: Reported on 05/11/2023 11/21/21   [provider]  TIADYLT  ER 180 MG 24 hr capsule Take 180 mg by mouth daily.    [provider]    Physical Exam: Constitutional: Moderately built and nourished. Vitals:   05/11/23 2345 05/12/23 0000 05/12/23 0015 05/12/23 0030  BP: (!) 167/67 (!) 197/74 (!) 189/65 (!) 184/82  Pulse: 79 83 66 69  Resp:      Temp:      TempSrc:      SpO2: 98% 100% 98% 98%  Weight:      Height:       Eyes: Anicteric no pallor. ENMT: No discharge from the ears eyes nose or mouth. Neck: No mass felt.  No neck rigidity. Respiratory: No rhonchi or crepitations. Cardiovascular: S1-S2 heard. Abdomen: Soft nontender bowel sound present. Musculoskeletal: Cold right foot pulses not palpable. Skin: No obvious ulcers. Neurologic: Alert awake oriented to time place and person.  Moves all extremities. Psychiatric: Appears normal.  Normal affect.   Labs on Admission: I have personally reviewed following labs and imaging studies  CBC: Recent Labs  Lab 05/11/23 2158  WBC 7.1  NEUTROABS 4.8  HGB 13.9  HCT 41.7  MCV 93.9  PLT 101*   Basic Metabolic Panel: Recent Labs  Lab 05/11/23 2158  NA 135  K 4.8  CL 104  CO2 21*  GLUCOSE 104*  BUN 14  CREATININE 0.89  CALCIUM  9.5   GFR: Estimated Creatinine Clearance: 57 mL/min (by C-G formula based on SCr of 0.89 mg/dL). Liver Function Tests: No results for input(s): "AST", "ALT", "ALKPHOS", "BILITOT", "PROT", "ALBUMIN " in  the last 168 hours. No results for input(s): "LIPASE", "AMYLASE" in the last 168 hours. No results for input(s): "AMMONIA" in the last 168 hours. Coagulation Profile: Recent Labs  Lab 05/11/23 2158  INR 1.1   Cardiac Enzymes: No results for input(s): "CKTOTAL", "CKMB", "CKMBINDEX", "TROPONINI" in the last 168 hours. BNP (last 3 results) No results for input(s): "PROBNP" in the last 8760 hours. HbA1C: No results for input(s): "HGBA1C" in the last 72 hours. CBG: No results for input(s): "GLUCAP" in the last 168 hours. Lipid Profile: No results for input(s): "CHOL", "HDL", "LDLCALC", "TRIG", "CHOLHDL", "LDLDIRECT" in the last 72 hours. Thyroid  Function Tests: No results for input(s): "TSH", "T4TOTAL", "FREET4", "T3FREE", "THYROIDAB" in the last 72 hours. Anemia Panel: No results for input(s): "VITAMINB12", "FOLATE", "FERRITIN", "TIBC", "IRON", "RETICCTPCT" in the last 72 hours. Urine analysis:    Component Value Date/Time   COLORURINE YELLOW 01/24/2022 1553   APPEARANCEUR CLEAR 01/24/2022 1553   LABSPEC  1.010 01/24/2022 1553   PHURINE 6.0 01/24/2022 1553   GLUCOSEU NEGATIVE 01/24/2022 1553   HGBUR NEGATIVE 01/24/2022 1553   BILIRUBINUR NEGATIVE 01/24/2022 1553   KETONESUR NEGATIVE 01/24/2022 1553   PROTEINUR NEGATIVE 01/24/2022 1553   NITRITE NEGATIVE 01/24/2022 1553   LEUKOCYTESUR NEGATIVE 01/24/2022 1553   Sepsis Labs: @LABRCNTIP (procalcitonin:4,lacticidven:4) )No results found for this or any previous visit (from the past 240 hours).   Radiological Exams on Admission: CT ANGIO AO+BIFEM W & OR WO CONTRAST Result Date: 05/12/2023 CLINICAL DATA:  Claudication or leg ischemia. Right leg is unable to bear weight and cold to touch. History of fem-pop bypass on the right leg. EXAM: CT ANGIOGRAPHY OF ABDOMINAL AORTA WITH ILIOFEMORAL RUNOFF TECHNIQUE: Multidetector CT imaging of the abdomen, pelvis and lower extremities was performed using the standard protocol during bolus  administration of intravenous contrast. Multiplanar CT image reconstructions and MIPs were obtained to evaluate the vascular anatomy. RADIATION DOSE REDUCTION: This exam was performed according to the departmental dose-optimization program which includes automated exposure control, adjustment of the mA and/or kV according to patient size and/or use of iterative reconstruction technique. CONTRAST:  OMNIPAQUE  IOHEXOL  350 MG/ML SOLN COMPARISON:  CT 11/29/2021 FINDINGS: VASCULAR Aorta: Advanced diffuse mixed density atherosclerotic plaque in the abdominal aorta without aneurysm or dissection. No hemodynamically significant stenosis. Celiac: No aneurysm, dissection, or hemodynamically significant stenosis. SMA: Moderate narrowing in the proximal SMA secondary to predominantly noncalcified atherosclerotic plaque. No aneurysm or dissection. Renals: Calcified plaque causes moderate narrowing in both renal arteries. No aneurysm or dissection. IMA: Patent. RIGHT Lower Extremity Inflow: Moderate stenosis in the right common iliac artery. 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 Right axillary femoral bypass graft. The femoral to femoral bypass graft is patent. Outflow: Chronic occlusion of the right femoral graft which was supplying a right profunda femoral stent graft which is also nonopacified. There is collateral reconstitution of the distal superficial femoral artery superior to the anastomosis with the profunda femoral stent graft. There is occlusion of the superficial femoral and popliteal arteries distal to the anastomosis with the stent graft. Runoff: Opacification of the proximal posterior tibial artery. No vessels are opacified at the level of the ankle. LEFT Lower Extremity Inflow: Severe narrowing of the left common iliac artery. The left external iliac artery is chronically occluded. Outflow: The femoral-femoral portion of the axillary femoral bypass graft is patent and supplies the diminutive left  superficial femoral artery and profunda femoral artery. There is occlusion of the mid to distal SFA. There is reconstitution of the popliteal artery. Runoff: Patent three vessel runoff to the ankle. Veins: No obvious venous abnormality within the limitations of this arterial phase study. Review of the MIP images confirms the above findings. NON-VASCULAR Lower chest: No acute abnormality. Hepatobiliary: No focal liver abnormality is seen. No gallstones, gallbladder wall thickening, or biliary dilatation. Pancreas: Unremarkable. No pancreatic ductal dilatation or surrounding inflammatory changes. Spleen: Normal in size without focal abnormality. Adrenals/Urinary Tract: Adrenal glands are unremarkable. Kidneys are normal, without renal calculi, focal lesion, or hydronephrosis. Bladder is unremarkable. Stomach/Bowel: No bowel obstruction or bowel wall thickening. Colonic diverticulosis without diverticulitis. Lymphatic: No lymphadenopathy. Reproductive: Pessary.  Uterus and ovaries are unremarkable. Other: No free intraperitoneal fluid or air. Musculoskeletal: No acute fracture. IMPRESSION: Extensive atherosclerotic disease of the aorta, mesenteric, renal, and lower extremity arteries as described. Changes compared to 11/29/2021 include placement of a right profunda femoral artery stent graft which is occluded and occlusion of the distal superficial femoral artery/popliteal artery.  Minimal opacification of the proximal right posterior tibial artery. The right runoff vessels are otherwise nonopacified. Critical Value/emergent results were called by telephone at the time of interpretation on 05/12/2023 at 12:05 am to provider Dr. Leida Puna, who verbally acknowledged these results. Electronically Signed   By: Rozell Cornet M.D.   On: 05/12/2023 00:07     Assessment/Plan Principal Problem:   Critical limb ischemia of right lower extremity (HCC) Active Problems:   PAD (peripheral artery disease) (HCC)    Hyperlipidemia   Hypertension   Mixed hyperlipidemia    Critical limb ischemia involving the right lower extremity -     appreciate vascular surgery consult patient is on heparin  infusion.  Vascular surgery planning to get CT angiogram of the abdomen pelvis with runoff to the toes.  N.p.o. after midnight.  Anticipating either endovascular or open intervention to treat the acute ischemic limb ischemia. Hypertension takes hydralazine  and ARB at home.  Presently on as needed IV hydralazine  while NPO. Chronic thrombocytopenia follow CBC. Hyperlipidemia intolerant to statins. Tobacco abuse advised about quitting.  Since patient has critical ischemia will need close monitoring further workup and more than 2 midnight stay.   DVT prophylaxis: Heparin  infusion. Code Status: Full code. Family Communication: Patient's granddaughter. Disposition Plan: Medical floor.   Consults called: Vascular surgery. Admission status: Observation.

## 2023-05-12 NOTE — Interval H&P Note (Signed)
 History and Physical Interval Note:  05/12/2023 10:27 AM  Katherine Archer  has presented today for surgery, with the diagnosis of Critical limb ischemia of right lower extremity.  The various methods of treatment have been discussed with the patient and family. After consideration of risks, benefits and other options for treatment, the patient has consented to  Procedure(s): BYPASS GRAFT FEMORAL-POPLITEAL ARTERY (Right) as a surgical intervention.  The patient's history has been reviewed, patient examined, no change in status, stable for surgery.  I have reviewed the patient's chart and labs.  Questions were answered to the patient's satisfaction.     Gareld June

## 2023-05-12 NOTE — Anesthesia Preprocedure Evaluation (Addendum)
 Anesthesia Evaluation  Patient identified by MRN, date of birth, ID band Patient awake    Reviewed: Allergy & Precautions, NPO status , Patient's Chart, lab work & pertinent test results  Airway Mallampati: II  TM Distance: >3 FB Neck ROM: Full    Dental  (+) Edentulous Upper, Edentulous Lower   Pulmonary former smoker   Pulmonary exam normal        Cardiovascular hypertension, Pt. on medications + Peripheral Vascular Disease  Normal cardiovascular exam     Neuro/Psych  Neuromuscular disease CVA, Residual Symptoms  negative psych ROS   GI/Hepatic negative GI ROS, Neg liver ROS,,,  Endo/Other  negative endocrine ROS    Renal/GU negative Renal ROS     Musculoskeletal Ambulates with cane   Abdominal   Peds  Hematology Thrombocytopenia   Anesthesia Other Findings Critical limb ischemia of right lower extremity  Reproductive/Obstetrics                             Anesthesia Physical Anesthesia Plan  ASA: 3  Anesthesia Plan: General   Post-op Pain Management:    Induction: Intravenous  PONV Risk Score and Plan: 3 and Ondansetron , Dexamethasone , Midazolam  and Treatment may vary due to age or medical condition  Airway Management Planned: Oral ETT  Additional Equipment: Arterial line  Intra-op Plan:   Post-operative Plan: Extubation in OR  Informed Consent: I have reviewed the patients History and Physical, chart, labs and discussed the procedure including the risks, benefits and alternatives for the proposed anesthesia with the patient or authorized representative who has indicated his/her understanding and acceptance.     Interpreter used for interview  Plan Discussed with: CRNA  Anesthesia Plan Comments:        Anesthesia Quick Evaluation

## 2023-05-12 NOTE — Anesthesia Procedure Notes (Signed)
 Arterial Line Insertion Start/End5/09/2023 10:47 AM, 05/12/2023 10:47 AM Performed by: Gaye Kato, CRNA, CRNA  Patient location: Pre-op. Preanesthetic checklist: patient identified, IV checked, site marked, risks and benefits discussed, surgical consent, monitors and equipment checked, pre-op evaluation and timeout performed Lidocaine  1% used for infiltration Left, radial was placed Catheter size: 20 G Hand hygiene performed , maximum sterile barriers used  and Seldinger technique used Allen's test indicative of satisfactory collateral circulation Attempts: 1 Procedure performed without using ultrasound guided technique. Following insertion, Biopatch and dressing applied. Post procedure assessment: normal  Patient tolerated the procedure well with no immediate complications.

## 2023-05-12 NOTE — Progress Notes (Signed)
 PHARMACY - ANTICOAGULATION CONSULT NOTE  Pharmacy Consult for heparin    Indication: ischemic limb   Allergies  Allergen Reactions   Amoxicillin Other (See Comments)    Skin infection on face   Penicillins Other (See Comments)    Shaky and Dizziness 20 years ago    Patient Measurements: Height: 5\' 6"  (167.6 cm) Weight: 58.1 kg (128 lb) IBW/kg (Calculated) : 59.3 HEPARIN  DW (KG): 58.1  Vital Signs: Temp: 98.6 F (37 C) (05/09 0803) Temp Source: Oral (05/09 0803) BP: 168/58 (05/09 0803) Pulse Rate: 58 (05/09 0803)  Labs: Recent Labs    05/11/23 2158 05/12/23 0542 05/12/23 0722  HGB 13.9 13.2  --   HCT 41.7 39.2  --   PLT 101* 109*  --   APTT 25  --   --   LABPROT 14.7  --   --   INR 1.1  --   --   HEPARINUNFRC  --   --  0.41  CREATININE 0.89 0.82  --     Estimated Creatinine Clearance: 61.9 mL/min (by C-G formula based on SCr of 0.82 mg/dL).  Assessment: Patient admitted for limb pain with extensive hx of limb ischemia requiring several interventions. Patient also hypertensive on admission. CBC in process, last CBC 01/2022 was within normal limits. Not on anticoagulation PTA. Pharmacy consulted to dose heparin  gtt.   Heparin  level is therapeutic at 0.41 on 850 units/hr. No bleeding noted, Hgb stable 13s, platelets are low but were also low in 2024.  Goal of Therapy:  Heparin  level 0.3-0.7 units/ml Monitor platelets by anticoagulation protocol: Yes   Plan:  Continue heparin  infusion at 850 units/hr 6 hr confirmatory heparin  level  Daily heparin  level and CBC Monitor for signs/symptoms of bleeding F/u after procedure today   Thank you for involving pharmacy in this patient's care.  Caroline Cinnamon, PharmD, BCPS Clinical Pharmacist Clinical phone for 05/12/2023 is [phone] 05/12/2023 8:23 AM

## 2023-05-12 NOTE — Progress Notes (Signed)
  Progress Note    05/12/2023 8:09 AM * Day of Surgery *  Subjective:  she says her right foot still hurts going up to the mid calf. She has reduced sensation and motor of the foot    Vitals:   05/12/23 0201 05/12/23 0803  BP: (!) 176/71 (!) 168/58  Pulse: (!) 56 (!) 58  Resp: 18 16  Temp: 98.2 F (36.8 C) 98.6 F (37 C)  SpO2: 97% 94%    Physical Exam: General:  resting, NAD Cardiac:  regular Lungs:  nonlabored Extremities:  palpable right femoral pulse. Nonpalpable right pedal pulses. No doppler signals in the right foot. Can wiggle toes a little bit on the right. Reduced sensation in the right foot  CBC    Component Value Date/Time   WBC 5.9 05/12/2023 0542   RBC 4.26 05/12/2023 0542   HGB 13.2 05/12/2023 0542   HGB 14.2 03/23/2021 0928   HCT 39.2 05/12/2023 0542   HCT 42.1 03/23/2021 0928   PLT 109 (L) 05/12/2023 0542   PLT 135 (L) 03/23/2021 0928   MCV 92.0 05/12/2023 0542   MCV 93 03/23/2021 0928   MCH 31.0 05/12/2023 0542   MCHC 33.7 05/12/2023 0542   RDW 12.4 05/12/2023 0542   RDW 12.0 03/23/2021 0928   LYMPHSABS 1.9 05/11/2023 2158   LYMPHSABS 1.9 03/23/2021 0928   MONOABS 0.4 05/11/2023 2158   EOSABS 0.0 05/11/2023 2158   EOSABS 0.3 03/23/2021 0928   BASOSABS 0.0 05/11/2023 2158   BASOSABS 0.0 03/23/2021 0928    BMET    Component Value Date/Time   NA 138 05/12/2023 0542   NA 138 01/15/2020 1128   K 3.9 05/12/2023 0542   CL 107 05/12/2023 0542   CO2 21 (L) 05/12/2023 0542   GLUCOSE 102 (H) 05/12/2023 0542   BUN 12 05/12/2023 0542   BUN 8 01/15/2020 1128   CREATININE 0.82 05/12/2023 0542   CALCIUM  9.5 05/12/2023 0542   GFRNONAA >60 05/12/2023 0542   GFRAA 80 01/15/2020 1128    INR    Component Value Date/Time   INR 1.1 05/11/2023 2158    No intake or output data in the 24 hours ending 05/12/23 0809    Assessment/Plan:  68 y.o. female with RLE acute limb ischemia   - The patient presented to the ED yesterday with right foot  pain with intact motor and sensation of the foot.  She was found to have no Doppler signals in the right foot.  She was started on a heparin  drip -CTA abdomen/pelvis with runoff demonstrates an occluded right distal SFA/popliteal artery and occluded right femoral to above-knee popliteal artery bypass graft  -On a palpable right femoral pulse.  She has no palpable pedal pulses in the right.  She has no Doppler signals in the right foot.  This is unchanged from her exam last night -She can wiggle her toes on the right.  She has sensation in the right foot, however this is reduced -Continue heparin  drip.  She has been n.p.o. past midnight.  She is agreeable for surgery later this morning   Deneise Finlay, PA-C [facility] [phone] 05/12/2023 8:09 AM

## 2023-05-12 NOTE — Progress Notes (Signed)
 [facility] PROGRESS NOTE   Katherine Archer ZOX:[id number] DOB: [birth year] DOA: 05/11/2023  PCP: Barbar Levine, MD  Brief History: 68 y.o. female with past history of axillobifemoral bypass on 11/21, right femoral endarterectomy, right femoral to above-knee popliteal artery bypass graft, right common femoral and profunda femoris thrombectomy on 1/24 presented to the ER for complaints of persistent pain in the right foot.  Concern was for critical ischemia.  Vascular surgery was consulted.  Patient was hospitalized for further management.   Consultants: Vascular surgery  Procedures: Plan is for surgical intervention later today    Subjective/Interval History: Daughter was at bedside and was able to interpret.  Currently patient denies any significant pain in the right foot.  No nausea vomiting chest pain shortness of breath.    Assessment/Plan:  Critical limb ischemia involving right lower extremity Patient with previous history of peripheral artery disease and has undergone bypass surgery as well as thrombectomy previously.  Last intervention was in January 2024. Came in with severe pain in the right foot.  CT angiogram shows occluded right distal SFA/popliteal artery and occluded right femoral to above-knee popliteal artery bypass graft. Vascular surgery is following.  Plan is for surgical intervention later today. Patient is currently on IV heparin . Patient was on aspirin  prior to admission but stopped taking medication few days ago in preparation for colonoscopy which was scheduled for today.  Patient's daughter unsure if she is taking Plavix  as well.  According to vascular surgery notes from 2024 she is supposed to be on Pletal  which is not listed on her current medication list.  Essential hypertension Continue as needed hydralazine .  ARB on hold.  Monitor blood pressures closely.  Pain is contributing to elevated blood pressure  readings.  Thrombocytopenia Stable.  Hyperlipidemia Patient has not been taking either the rosuvastatin  or ezetimibe .  Reason is unclear.  ? intolerance. Will need to address this with patient and family.  Tobacco abuse Counseled.  DVT Prophylaxis: On IV heparin  Code Status: Full code Family Communication: Discussed with daughter Disposition Plan: Hopefully return home when stable  Status is: Observation The patient will require care spanning > 2 midnights and should be moved to inpatient because: Critical limb ischemia      Medications: Scheduled:  chlorhexidine   15 mL Mouth/Throat Once   Or   mouth rinse  15 mL Mouth Rinse Once   Continuous:  heparin  850 Units/hr (05/12/23 0019)   lactated ringers  50 mL/hr at 05/12/23 0219   PRN:hydrALAZINE   Antibiotics: Anti-infectives (From admission, onward)    None       Objective:  Vital Signs  Vitals:   05/12/23 0134 05/12/23 0134 05/12/23 0201 05/12/23 0803  BP:  (!) 180/84 (!) 176/71 (!) 168/58  Pulse:  71 (!) 56 (!) 58  Resp:  18 18 16   Temp: 98.8 F (37.1 C)  98.2 F (36.8 C) 98.6 F (37 C)  TempSrc: Oral  Oral Oral  SpO2:  98% 97% 94%  Weight:      Height:       No intake or output data in the 24 hours ending 05/12/23 0851 Filed Weights   05/11/23 2149  Weight: 58.1 kg    General appearance: Awake alert.  In no distress Resp: Clear to auscultation bilaterally.  Normal effort Cardio: S1-S2 is normal regular.  No S3-S4.  No rubs murmurs or bruit GI: Abdomen is soft.  Nontender nondistended.  Bowel sounds are present normal.  No masses organomegaly Right foot is noted to  be colder compared to the left.  Able to move her toes.  Pulses not palpable. No obvious focal neurological deficits.   Lab Results:  Data Reviewed: I have personally reviewed following labs and reports of the imaging studies  CBC: Recent Labs  Lab 05/11/23 2158 05/12/23 0542  WBC 7.1 5.9  NEUTROABS 4.8  --   HGB 13.9  13.2  HCT 41.7 39.2  MCV 93.9 92.0  PLT 101* 109*    Basic Metabolic Panel: Recent Labs  Lab 05/11/23 2158 05/12/23 0542  NA 135 138  K 4.8 3.9  CL 104 107  CO2 21* 21*  GLUCOSE 104* 102*  BUN 14 12  CREATININE 0.89 0.82  CALCIUM  9.5 9.5    GFR: Estimated Creatinine Clearance: 61.9 mL/min (by C-G formula based on SCr of 0.82 mg/dL).  Coagulation Profile: Recent Labs  Lab 05/11/23 2158  INR 1.1    Radiology Studies: CT ANGIO AO+BIFEM W & OR WO CONTRAST Result Date: 05/12/2023 CLINICAL DATA:  Claudication or leg ischemia. Right leg is unable to bear weight and cold to touch. History of fem-pop bypass on the right leg. EXAM: CT ANGIOGRAPHY OF ABDOMINAL AORTA WITH ILIOFEMORAL RUNOFF TECHNIQUE: Multidetector CT imaging of the abdomen, pelvis and lower extremities was performed using the standard protocol during bolus administration of intravenous contrast. Multiplanar CT image reconstructions and MIPs were obtained to evaluate the vascular anatomy. RADIATION DOSE REDUCTION: This exam was performed according to the departmental dose-optimization program which includes automated exposure control, adjustment of the mA and/or kV according to patient size and/or use of iterative reconstruction technique. CONTRAST:  OMNIPAQUE  IOHEXOL  350 MG/ML SOLN COMPARISON:  CT 11/29/2021 FINDINGS: VASCULAR Aorta: Advanced diffuse mixed density atherosclerotic plaque in the abdominal aorta without aneurysm or dissection. No hemodynamically significant stenosis. Celiac: No aneurysm, dissection, or hemodynamically significant stenosis. SMA: Moderate narrowing in the proximal SMA secondary to predominantly noncalcified atherosclerotic plaque. No aneurysm or dissection. Renals: Calcified plaque causes moderate narrowing in both renal arteries. No aneurysm or dissection. IMA: Patent. RIGHT Lower Extremity Inflow: Moderate stenosis in the right common iliac artery. 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 Right axillary femoral bypass graft. The femoral to femoral bypass graft is patent. Outflow: Chronic occlusion of the right femoral graft which was supplying a right profunda femoral stent graft which is also nonopacified. There is collateral reconstitution of the distal superficial femoral artery superior to the anastomosis with the profunda femoral stent graft. There is occlusion of the superficial femoral and popliteal arteries distal to the anastomosis with the stent graft. Runoff: Opacification of the proximal posterior tibial artery. No vessels are opacified at the level of the ankle. LEFT Lower Extremity Inflow: Severe narrowing of the left common iliac artery. The left external iliac artery is chronically occluded. Outflow: The femoral-femoral portion of the axillary femoral bypass graft is patent and supplies the diminutive left superficial femoral artery and profunda femoral artery. There is occlusion of the mid to distal SFA. There is reconstitution of the popliteal artery. Runoff: Patent three vessel runoff to the ankle. Veins: No obvious venous abnormality within the limitations of this arterial phase study. Review of the MIP images confirms the above findings. NON-VASCULAR Lower chest: No acute abnormality. Hepatobiliary: No focal liver abnormality is seen. No gallstones, gallbladder wall thickening, or biliary dilatation. Pancreas: Unremarkable. No pancreatic ductal dilatation or surrounding inflammatory changes. Spleen: Normal in size without focal abnormality. Adrenals/Urinary Tract: Adrenal glands are unremarkable. Kidneys are normal, without renal calculi, focal  lesion, or hydronephrosis. Bladder is unremarkable. Stomach/Bowel: No bowel obstruction or bowel wall thickening. Colonic diverticulosis without diverticulitis. Lymphatic: No lymphadenopathy. Reproductive: Pessary.  Uterus and ovaries are unremarkable. Other: No free intraperitoneal fluid or air. Musculoskeletal: No acute  fracture. IMPRESSION: Extensive atherosclerotic disease of the aorta, mesenteric, renal, and lower extremity arteries as described. Changes compared to 11/29/2021 include placement of a right profunda femoral artery stent graft which is occluded and occlusion of the distal superficial femoral artery/popliteal artery. Minimal opacification of the proximal right posterior tibial artery. The right runoff vessels are otherwise nonopacified. Critical Value/emergent results were called by telephone at the time of interpretation on 05/12/2023 at 12:05 am to provider Dr. Leida Puna, who verbally acknowledged these results. Electronically Signed   By: Rozell Cornet M.D.   On: 05/12/2023 00:07       LOS: 0 days   Katherine Archer  [facility] Pager on [email or web address]  05/12/2023, 8:51 AM

## 2023-05-12 NOTE — Op Note (Signed)
 Patient name: Katherine Archer MRN: [id number] DOB: [birth year] Sex: female  05/12/2023 Pre-operative Diagnosis: Right femoral-popliteal bypass graft occlusion with rest pain Post-operative diagnosis:  Same Surgeon:  Gareld June Assistants:  Steffani Edman, Naida Austria, PA Procedure:   #1: Redo right femoral artery exposure   #2: Right femoral thrombectomy   #3: Right below-knee popliteal artery exposure   #4: Right femoral to below-knee popliteal artery bypass graft with 6 mm external ring PTFE   #5: Placement of first 38 cm skin substitute (Kerecis to the right groin Anesthesia:  General Blood Loss:  200 cc Specimens:  none  Findings: Occlusion of the right common femoral artery and femoral-popliteal bypass graft with acute thrombus.  The profunda was chronically occluded.  She had previously had a dacryon patch placed onto the profundofemoral artery which I fully exposed.  When I opened the artery I could not get a catheter to pass down the profunda and so I opened the entire length of the patch and it was chronically occluded.  I then continued to open the profunda for another 1-1/2 cm down to where it had multiple small branches.  There was only backbleeding from the tributary branches.  I passed a Fogarty down the main profunda trunk and removed a long segment of chronic occlusion.  Ultimately, there was no way to reconstruct the profundofemoral artery and so it was ultimately ligated.  I tried to get a Fogarty through the occluded bypass graft but could not get across the distal anastomosis and so I felt the best course of action was a below-knee bypass.  The below-knee popliteal artery was disease-free measuring about 4 mm.  The proximal anastomosis was to the hood of the common femoral artery where the previous bypass graft had been located.  The anterior tibial vein was ligated  Indications: This is a 68 year old female with history of a axillary bifemoral bypass graft by Dr. Nolene Baumgarten.  She  continued to have nonhealing wounds in her right leg and so she underwent right femoral to above-knee popliteal artery bypass graft with profunda femoral endarterectomy and patch angioplasty.  She presented with a 1 day history of right leg rest pain CT scan showed thrombus within the common femoral artery and occluded bypass graft.  She comes in today for surgical revascularization.  Procedure:  The patient was identified in the holding area and taken to [facility] OR ROOM 11  The patient was then placed supine on the table. general anesthesia was administered.  The patient was prepped and draped in the usual sterile fashion.  A time out was called and antibiotics were administered.  An experienced assistant was necessary due to the complexity of the procedure.  Dr. Edgardo Goodwill helped performed the proximal anastomosis.  The PA help with exposure by providing suction and retraction.  The patient's previous right groin incision was opened with a 10 blade.  Cautery and sharp dissection were used to dissect through the scarred and groin.  I dissected free the profundofemoral artery exposing the previous dacryon patch.  I exposed the axillary femoral graft and common femoral artery.  I identified the femoral-femoral graft and was able to dissect out everything distal to this.  The PTFE femoropopliteal graft was also exposed.  Once I was satisfied with exposure, the patient was fully heparinized.  Vascular clamps were then used to occlude the inflow and a #11 blade was used to open the hood of the PTFE graft onto the femoral artery.  There was a large  amount of neointimal and fresh thrombus that was able to be evacuated.  The common femoral artery was completely cleaned out and there was no significant plaque.  There was excellent inflow.  I then tried to pass a catheter down the profundofemoral artery but could not get the catheter to pass.  I ultimately decided to open the patch on the profundofemoral artery.  The artery  appeared to be chronically occluded.  I continue to open the profundofemoral artery for an additional 2 cm distal to the patch where it had multiple branches which were individually isolated.  There was backbleeding from the branches but not the main trunk.  I did pass a #3 Fogarty down the main trunk and recovered a long segment of chronic occlusion.  At this point there was no real subsidence to the profundofemoral artery just small tributary branches which I ligated.  There was no way to reconstruct the profundofemoral artery.  I then tried to open the femoral-popliteal graft.  I was unable to get a Fogarty to cross the distal anastomosis and so I felt the best option was a below-knee bypass.  A medial below-knee incision was then made with a 10 blade.  Cautery was used to divide the subcutaneous tissue down to the fascia which was opened with cautery.  The popliteal space was entered.  I fully mobilized the vein, ligating the anterior tibial vein branch.  The below-knee popliteal artery was disease-free.  I placed a vessel loop around the tibioperoneal trunk and anterior tibial artery.  Next a subsartorial tunnel was created with a long Gore tunneler and a 6 mm external ring PTFE graft was brought through the tunnel.  Dr. Wayne Haines performed the anastomosis and the right groin with a running 5-0 Prolene.  I opened the below-knee popliteal artery with a #11 blade and extended it longitudinally with Potts scissors.  No thrombus was visualized in the below-knee popliteal artery.  I passed a #3 Fogarty down the tibioperoneal trunk and anterior tibial artery.  I did not blow up the balloon because there was good backbleeding and there was no visible thrombus.  The catheter went all the way down to the ankle.  The leg was then straightened and the graft was cut the appropriate length and spatulated to fit the size the arteriotomy.  A running anastomosis was created with 6-0 Prolene.  Prior to completion appropriate  flushing maneuvers were performed and the anastomosis was completed.  Blood flow was returned to the right leg.  The patient had an excellent anterior tibial Doppler signal.  The wound was then irrigated.  Hemostasis was achieved.  50 mg of protamine  was administered.  The below-knee incision was closed by reapproximating the fascia with 2-0 Vicryl, the subcutaneous tissue with 3-0 Vicryl and the skin with 4-0 Vicryl followed by Dermabond.  The groin was closed by reapproximating the subcutaneous tissue in multiple layers.  38 cm of Kerecis was placed in the wound followed by subcuticular closure.  Dermabond was applied.  The patient was successfully extubated and taken recovery room stable condition.  There were no immediate complications.   Disposition: To PACU stable.   Reinaldo Caras, M.D., [facility] [place] Office: [phone] Pager:  [phone]

## 2023-05-12 NOTE — H&P (View-Only) (Signed)
  Progress Note    05/12/2023 8:09 AM * Day of Surgery *  Subjective:  she says her right foot still hurts going up to the mid calf. She has reduced sensation and motor of the foot    Vitals:   05/12/23 0201 05/12/23 0803  BP: (!) 176/71 (!) 168/58  Pulse: (!) 56 (!) 58  Resp: 18 16  Temp: 98.2 F (36.8 C) 98.6 F (37 C)  SpO2: 97% 94%    Physical Exam: General:  resting, NAD Cardiac:  regular Lungs:  nonlabored Extremities:  palpable right femoral pulse. Nonpalpable right pedal pulses. No doppler signals in the right foot. Can wiggle toes a little bit on the right. Reduced sensation in the right foot  CBC    Component Value Date/Time   WBC 5.9 05/12/2023 0542   RBC 4.26 05/12/2023 0542   HGB 13.2 05/12/2023 0542   HGB 14.2 03/23/2021 0928   HCT 39.2 05/12/2023 0542   HCT 42.1 03/23/2021 0928   PLT 109 (L) 05/12/2023 0542   PLT 135 (L) 03/23/2021 0928   MCV 92.0 05/12/2023 0542   MCV 93 03/23/2021 0928   MCH 31.0 05/12/2023 0542   MCHC 33.7 05/12/2023 0542   RDW 12.4 05/12/2023 0542   RDW 12.0 03/23/2021 0928   LYMPHSABS 1.9 05/11/2023 2158   LYMPHSABS 1.9 03/23/2021 0928   MONOABS 0.4 05/11/2023 2158   EOSABS 0.0 05/11/2023 2158   EOSABS 0.3 03/23/2021 0928   BASOSABS 0.0 05/11/2023 2158   BASOSABS 0.0 03/23/2021 0928    BMET    Component Value Date/Time   NA 138 05/12/2023 0542   NA 138 01/15/2020 1128   K 3.9 05/12/2023 0542   CL 107 05/12/2023 0542   CO2 21 (L) 05/12/2023 0542   GLUCOSE 102 (H) 05/12/2023 0542   BUN 12 05/12/2023 0542   BUN 8 01/15/2020 1128   CREATININE 0.82 05/12/2023 0542   CALCIUM  9.5 05/12/2023 0542   GFRNONAA >60 05/12/2023 0542   GFRAA 80 01/15/2020 1128    INR    Component Value Date/Time   INR 1.1 05/11/2023 2158    No intake or output data in the 24 hours ending 05/12/23 0809    Assessment/Plan:  68 y.o. female with RLE acute limb ischemia   - The patient presented to the ED yesterday with right foot  pain with intact motor and sensation of the foot.  She was found to have no Doppler signals in the right foot.  She was started on a heparin  drip -CTA abdomen/pelvis with runoff demonstrates an occluded right distal SFA/popliteal artery and occluded right femoral to above-knee popliteal artery bypass graft  -On a palpable right femoral pulse.  She has no palpable pedal pulses in the right.  She has no Doppler signals in the right foot.  This is unchanged from her exam last night -She can wiggle her toes on the right.  She has sensation in the right foot, however this is reduced -Continue heparin  drip.  She has been n.p.o. past midnight.  She is agreeable for surgery later this morning   Deneise Finlay, PA-C [facility] [phone] 05/12/2023 8:09 AM

## 2023-05-12 NOTE — Transfer of Care (Signed)
 Immediate Anesthesia Transfer of Care Note  Patient: Katherine Archer  Procedure(s) Performed: THROMBECTOMY, BYPASS GRAFT, ARTERIAL (Right: Leg Upper) BYPASS GRAFT FEMORAL-POPLITEAL ARTERY (Right: Leg Upper) APPLICATION, SKIN SUBSTITUTE KERECIS 38SQ CM (Right: Groin)  Patient Location: PACU  Anesthesia Type:General  Level of Consciousness: awake, drowsy, patient cooperative, and responds to stimulation  Airway & Oxygen Therapy: Patient Spontanous Breathing and Patient connected to face mask oxygen  Post-op Assessment: Report given to RN and Post -op Vital signs reviewed and stable  Post vital signs: Reviewed and stable  Last Vitals:  Vitals Value Taken Time  BP 147/70 05/12/23 1511  Temp    Pulse 49 05/12/23 1514  Resp 13 05/12/23 1514  SpO2 100 % 05/12/23 1514  Vitals shown include unfiled device data.  Last Pain:  Vitals:   05/12/23 1017  TempSrc:   PainSc: 4          Complications: No notable events documented.

## 2023-05-12 NOTE — Progress Notes (Signed)
 PHARMACY - ANTICOAGULATION CONSULT NOTE  Pharmacy Consult for heparin    Indication: ischemic limb   Allergies  Allergen Reactions   Amoxicillin Other (See Comments)    Skin infection on face   Penicillins Other (See Comments)    Shaky and Dizziness 20 years ago    Patient Measurements: Height: 5\' 6"  (167.6 cm) Weight: 56.7 kg (125 lb) IBW/kg (Calculated) : 59.3 HEPARIN  DW (KG): 56.7  Vital Signs: Temp: 97.8 F (36.6 C) (05/09 1625) Temp Source: Oral (05/09 1625) BP: 131/61 (05/09 1700) Pulse Rate: 48 (05/09 1700)  Labs: Recent Labs    05/11/23 2158 05/12/23 0542 05/12/23 0722  HGB 13.9 13.2  --   HCT 41.7 39.2  --   PLT 101* 109*  --   APTT 25  --   --   LABPROT 14.7  --   --   INR 1.1  --   --   HEPARINUNFRC  --   --  0.41  CREATININE 0.89 0.82  --     Estimated Creatinine Clearance: 60.4 mL/min (by C-G formula based on SCr of 0.82 mg/dL).  Assessment: Patient admitted for limb pain with extensive hx of limb ischemia requiring several interventions. Patient also hypertensive on admission. CBC in process, last CBC 01/2022 was within normal limits. Not on anticoagulation PTA. Pharmacy consulted to dose heparin  gtt.    Post VVS, restart heparin  no titration tonight. Then titrate starting tomorrow   Goal of Therapy:  Heparin  level 0.3-0.7 units/ml Monitor platelets by anticoagulation protocol: Yes   Plan:  Start Heparin  500 units/hr at 22:00 with no titration Star titrating 5/10 AM per VVS Monitor daily heparin  level, CBC, signs/symptoms of bleeding    Dorene Gang, PharmD, BCPS, [facility] Clinical Pharmacist  Please check AMION for all [facility] Pharmacy phone numbers After 10:00 PM, call Main Pharmacy [phone]

## 2023-05-13 DIAGNOSIS — D696 Thrombocytopenia, unspecified: Secondary | ICD-10-CM | POA: Diagnosis not present

## 2023-05-13 DIAGNOSIS — I159 Secondary hypertension, unspecified: Secondary | ICD-10-CM | POA: Diagnosis not present

## 2023-05-13 DIAGNOSIS — I70221 Atherosclerosis of native arteries of extremities with rest pain, right leg: Secondary | ICD-10-CM | POA: Diagnosis not present

## 2023-05-13 DIAGNOSIS — Z95828 Presence of other vascular implants and grafts: Secondary | ICD-10-CM

## 2023-05-13 LAB — BASIC METABOLIC PANEL WITH GFR
Anion gap: 7 (ref 5–15)
BUN: 9 mg/dL (ref 8–23)
CO2: 22 mmol/L (ref 22–32)
Calcium: 8.3 mg/dL — ABNORMAL LOW (ref 8.9–10.3)
Chloride: 106 mmol/L (ref 98–111)
Creatinine, Ser: 0.88 mg/dL (ref 0.44–1.00)
GFR, Estimated: >60 mL/min (ref >60)
Glucose, Bld: 108 mg/dL — ABNORMAL HIGH (ref 70–99)
Potassium: 3.5 mmol/L (ref 3.5–5.1)
Sodium: 135 mmol/L (ref 135–145)

## 2023-05-13 LAB — CBC
HCT: 33.9 % — ABNORMAL LOW (ref 36.0–46.0)
Hemoglobin: 11.1 g/dL — ABNORMAL LOW (ref 12.0–15.0)
MCH: 30.5 pg (ref 26.0–34.0)
MCHC: 32.7 g/dL (ref 30.0–36.0)
MCV: 93.1 fL (ref 80.0–100.0)
Platelets: 84 10*3/uL — ABNORMAL LOW (ref 150–400)
RBC: 3.64 MIL/uL — ABNORMAL LOW (ref 3.87–5.11)
RDW: 12.4 % (ref 11.5–15.5)
WBC: 8.2 10*3/uL (ref 4.0–10.5)
nRBC: 0 % (ref 0.0–0.2)

## 2023-05-13 LAB — HEPARIN LEVEL (UNFRACTIONATED)
Heparin Unfractionated: <0.1 [IU]/mL — ABNORMAL LOW (ref 0.30–0.70)
Heparin Unfractionated: 0.14 [IU]/mL — ABNORMAL LOW (ref 0.30–0.70)

## 2023-05-13 MED ORDER — POTASSIUM CHLORIDE CRYS ER 20 MEQ PO TBCR
40.0000 meq | EXTENDED_RELEASE_TABLET | Freq: Once | ORAL | Status: AC
  Administered 2023-05-13: 40 meq via ORAL
  Filled 2023-05-13: qty 2

## 2023-05-13 NOTE — Progress Notes (Addendum)
  Progress Note    05/13/2023 6:47 AM 1 Day Post-Op  Subjective:  says her foot feels better  Tm 99.4  HR 50's-60's NSR 100's-160's systolic 98% RA  Vitals:   05/13/23 0325 05/13/23 0400  BP: (!) 130/59   Pulse: 62   Resp: 19 20  Temp: 99.4 F (37.4 C)   SpO2: 100%     Physical Exam: General:  no distress; resting comfortably Cardiac:  regular Lungs:  non labored Incisions:  right groin and BK incisions look fine Extremities:  brisk doppler flow right DP/PT; right foot warm.  Right calf is soft and mildly tender to palpation.   CBC    Component Value Date/Time   WBC 8.2 05/13/2023 0344   RBC 3.64 (L) 05/13/2023 0344   HGB 11.1 (L) 05/13/2023 0344   HGB 14.2 03/23/2021 0928   HCT 33.9 (L) 05/13/2023 0344   HCT 42.1 03/23/2021 0928   PLT 84 (L) 05/13/2023 0344   PLT 135 (L) 03/23/2021 0928   MCV 93.1 05/13/2023 0344   MCV 93 03/23/2021 0928   MCH 30.5 05/13/2023 0344   MCHC 32.7 05/13/2023 0344   RDW 12.4 05/13/2023 0344   RDW 12.0 03/23/2021 0928   LYMPHSABS 1.9 05/11/2023 2158   LYMPHSABS 1.9 03/23/2021 0928   MONOABS 0.4 05/11/2023 2158   EOSABS 0.0 05/11/2023 2158   EOSABS 0.3 03/23/2021 0928   BASOSABS 0.0 05/11/2023 2158   BASOSABS 0.0 03/23/2021 0928    BMET    Component Value Date/Time   NA 135 05/13/2023 0344   NA 138 01/15/2020 1128   K 3.5 05/13/2023 0344   CL 106 05/13/2023 0344   CO2 22 05/13/2023 0344   GLUCOSE 108 (H) 05/13/2023 0344   BUN 9 05/13/2023 0344   BUN 8 01/15/2020 1128   CREATININE 0.88 05/13/2023 0344   CALCIUM  8.3 (L) 05/13/2023 0344   GFRNONAA >60 05/13/2023 0344   GFRAA 80 01/15/2020 1128    INR    Component Value Date/Time   INR 1.1 05/11/2023 2158     Intake/Output Summary (Last 24 hours) at 05/13/2023 0647 Last data filed at 05/13/2023 0340 Gross per 24 hour  Intake 900 ml  Output 950 ml  Net -50 ml      Assessment/Plan:  68 y.o. female is s/p:  Right femoral to BK popliteal bypass with PTFE for  occluded bypass and rest pain on 05/12/2023 by Dr. Charlotte Cookey  1 Day Post-Op   -pt with brisk doppler flow right DP /PT.  Motor and sensation are in tact.  Her incisions look good.  She does have some mild tenderness in the right calf, however, the calf is soft and most likely incisional pain.   -acute blood loss anemia-hgb 11.1 down from 13.2 tolerating.   -thrombocytopenia-plt 84k down from 109.  Baseline from earlier this year.  -DVT prophylaxis:  heparin  gtt -mobilize today.   Maryanna Smart, PA-C [facility] [phone] 05/13/2023 6:47 AM   I have interviewed and examined patient with PA and agree with assessment and plan above.   Kem Hensen C. Vikki Graves, MD [facility] [place] Office: [phone] Pager: [phone]

## 2023-05-13 NOTE — Anesthesia Postprocedure Evaluation (Signed)
 Anesthesia Post Note  Patient: [name]  Procedure(s) Performed: THROMBECTOMY, BYPASS GRAFT, ARTERIAL (Right: Leg Upper) BYPASS GRAFT FEMORAL-POPLITEAL ARTERY (Right: Leg Upper) APPLICATION, SKIN SUBSTITUTE KERECIS 38SQ CM (Right: Groin)     Patient location during evaluation: PACU Anesthesia Type: General Level of consciousness: awake and alert Pain management: pain level controlled Vital Signs Assessment: post-procedure vital signs reviewed and stable Respiratory status: spontaneous breathing, nonlabored ventilation, respiratory function stable and patient connected to nasal cannula oxygen Cardiovascular status: blood pressure returned to baseline and stable Postop Assessment: no apparent nausea or vomiting Anesthetic complications: no   No notable events documented.  Last Vitals:    Last Pain:                 Erin Havers

## 2023-05-13 NOTE — Plan of Care (Signed)
   Problem: Education: Goal: Knowledge of General Education information will improve Description Including pain rating scale, medication(s)/side effects and non-pharmacologic comfort measures Outcome: Progressing

## 2023-05-13 NOTE — Progress Notes (Signed)
 PHARMACY - ANTICOAGULATION CONSULT NOTE  Pharmacy Consult for heparin    Indication: ischemic limb   Allergies  Allergen Reactions   Amoxicillin Other (See Comments)    Skin infection on face   Penicillins Other (See Comments)    Shaky and Dizziness 20 years ago    Patient Measurements: Height: 5\' 6"  (167.6 cm) Weight: 56.7 kg (125 lb) IBW/kg (Calculated) : 59.3 HEPARIN  DW (KG): 56.7  Vital Signs: Temp: 99.4 F (37.4 C) (05/10 0325) Temp Source: Oral (05/10 0325) BP: 130/59 (05/10 0325) Pulse Rate: 62 (05/10 0325)  Labs: Recent Labs    05/11/23 2158 05/12/23 0542 05/12/23 0722 05/13/23 0344  HGB 13.9 13.2  --  11.1*  HCT 41.7 39.2  --  33.9*  PLT 101* 109*  --  84*  APTT 25  --   --   --   LABPROT 14.7  --   --   --   INR 1.1  --   --   --   HEPARINUNFRC  --   --  0.41 <0.10*  CREATININE 0.89 0.82  --  0.88    Estimated Creatinine Clearance: 56.3 mL/min (by C-G formula based on SCr of 0.88 mg/dL).  Assessment: Patient admitted for limb pain with extensive hx of limb ischemia requiring several interventions. Patient also hypertensive on admission. Not on anticoagulation PTA. Pharmacy consulted to dose heparin  gtt post VVS.  This morning, heparin  subtherapeutic (<1.10) on 500 units/hour. Per, VVS titration okay today. CBC okay post-surgery - hgb 13.2 > 11.1 and plts 109 > 84. No signs/symptoms of bleeding or issues with the infusion.    Goal of Therapy:  Heparin  level 0.3-0.7 units/ml Monitor platelets by anticoagulation protocol: Yes   Plan:  Increase heparin  to 750 units/hr Monitor daily heparin  level, CBC, signs/symptoms of bleeding   Adaline Ada, PharmD PGY1 Pharmacy Resident 05/13/2023 7:15 AM   Please check AMION for all [facility] Pharmacy phone numbers After 10:00 PM, call Main Pharmacy [phone]

## 2023-05-13 NOTE — Evaluation (Signed)
 Occupational Therapy Evaluation Patient Details Name: Katherine Archer MRN: [id number] DOB: October 28, 1955 Today's Date: 05/13/2023   History of Present Illness   Katherine Archer is a 68 y.o. female presented 05/11/23 with RLE pain. Acute ischemic RLE; PMH positive for PAD, axillobifemoral bypass, multiple RLE procedures, stroke, HTN, HLD.     Clinical Impressions At baseline, pt is Independent with ADLs and performs functional mobility Independent to Mod I with a SPC or RW. Pt now presents with decreased activity tolerance, decreased balance, pain in R LE affecting functional level, and decreased safety and independence with functional tasks. Pt currently demonstrates ability to complete ADLs Independent to Min assist and functional mobility/transfers with a RW with Supervision to Contact guard assist for safety. Pt participated well in session, is motivated to return to PLOF, and has good family support. Pt HR in the mid-70s up to 99 bpm during session. Pt will benefit from acute skilled OT services to address deficits outlined below and to increase safety and independence with functional tasks. No post acute skilled OT needs are anticipated at this time.      If plan is discharge home, recommend the following:   A little help with walking and/or transfers;A little help with bathing/dressing/bathroom;Assistance with cooking/housework;Assist for transportation;Help with stairs or ramp for entrance     Functional Status Assessment   Patient has had a recent decline in their functional status and demonstrates the ability to make significant improvements in function in a reasonable and predictable amount of time.     Equipment Recommendations   None recommended by OT     Recommendations for Other Services         Precautions/Restrictions   Precautions Precautions: Fall Restrictions Weight Bearing Restrictions Per Provider Order: No     Mobility Bed Mobility Overal bed mobility: Needs  Assistance Bed Mobility: Supine to Sit     Supine to sit: Supervision, HOB elevated     General bed mobility comments: with increased time and verbal cue to bring hips to EOB    Transfers Overall transfer level: Needs assistance Equipment used: Rolling walker (2 wheels) Transfers: Sit to/from Stand, Bed to chair/wheelchair/BSC Sit to Stand: Supervision     Step pivot transfers: Supervision, Contact guard assist     General transfer comment: Largely Supervision with occasional CGA for safety due to mild balance deficits      Balance Overall balance assessment: Mild deficits observed, not formally tested                                         ADL either performed or assessed with clinical judgement   ADL Overall ADL's : Needs assistance/impaired Eating/Feeding: Independent;Sitting   Grooming: Oral care;Supervision/safety;Standing   Upper Body Bathing: Set up;Sitting   Lower Body Bathing: Supervison/ safety;Minimal assistance;Sitting/lateral leans;Sit to/from stand Lower Body Bathing Details (indicate cue type and reason): simulated; assist for washing/drying R foot, otherwise Supervision Upper Body Dressing : Set up;Contact guard assist;Sitting Upper Body Dressing Details (indicate cue type and reason): assist due to telemetry lines only Lower Body Dressing: Sitting/lateral leans;Minimal assistance;Supervision/safety Lower Body Dressing Details (indicate cue type and reason): simulated; assist for donning/doffing R sock, otherwise Supervision Toilet Transfer: Supervision/safety;Contact guard assist;Ambulation;BSC/3in1;Rolling walker (2 wheels) Toilet Transfer Details (indicate cue type and reason): simulated bed to chair Toileting- Clothing Manipulation and Hygiene: Supervision/safety;Contact guard assist;Sitting/lateral lean;Sit to/from stand       Functional  mobility during ADLs: Supervision/safety;Contact guard assist;Rolling walker (2 wheels)  (Largely Supervision with occasional CGA for safety due to mild balance deficits) General ADL Comments: Pt with decreased activity tolerance. Pain in R LE also affecting funcitonal level with LB dressing/bathing     Vision Baseline Vision/History: 0 No visual deficits Ability to See in Adequate Light: 0 Adequate Patient Visual Report: No change from baseline       Perception         Praxis         Pertinent Vitals/Pain Pain Assessment Pain Assessment: 0-10 Pain Score: 3  Pain Location: R LE Pain Descriptors / Indicators: Aching, Discomfort, Sore, Guarding Pain Intervention(s): Limited activity within patient's tolerance, Monitored during session, Repositioned     Extremity/Trunk Assessment Upper Extremity Assessment Upper Extremity Assessment: Right hand dominant;Overall WFL for tasks assessed;LUE deficits/detail LUE Deficits / Details: Overall WFL; mildly decreased fine motor control with pt stating this is baseline LUE Coordination: decreased fine motor (mild)   Lower Extremity Assessment Lower Extremity Assessment: Defer to PT evaluation   Cervical / Trunk Assessment Cervical / Trunk Assessment: Normal   Communication Communication Communication: No apparent difficulties;Other (comment) Factors Affecting Communication:  (Non-English speaker; phone medical interpreter services used throughout session)   Cognition Arousal: Alert Behavior During Therapy: WFL for tasks assessed/performed Cognition: No apparent impairments             OT - Cognition Comments: Pt AAO and pleasant throughout session.                 Following commands: Intact       Cueing  General Comments   Cueing Techniques: Verbal cues;Gestural cues;Tactile cues;Visual cues  HR in the mid-70s up to 99 bpm during session.   Exercises     Shoulder Instructions      Home Living Family/patient expects to be discharged to:: Private residence Living Arrangements: Children;Other  relatives (daughter, grandson) Available Help at Discharge: Family;Available 24 hours/day Type of Home: Mobile home Home Access: Stairs to enter Entrance Stairs-Number of Steps: 5 Entrance Stairs-Rails: Right;Left;Can reach both Home Layout: One level     Bathroom Shower/Tub: Chief Strategy Officer: Standard Bathroom Accessibility: Yes   Home Equipment: Cane - single point;Rolling Walker (2 wheels)          Prior Functioning/Environment Prior Level of Function : Independent/Modified Independent             Mobility Comments: Pt reports she uses SPC and RW intermittently. pt reports no recent falls. ADLs Comments: Pt typically Independent with ADLs. Pt reports often taking a sponge bath due to the tub/shower bring small.    OT Problem List: Decreased activity tolerance;Impaired balance (sitting and/or standing)   OT Treatment/Interventions: Self-care/ADL training;Energy conservation;DME and/or AE instruction;Therapeutic activities;Patient/family education;Balance training      OT Goals(Current goals can be found in the care plan section)   Acute Rehab OT Goals Patient Stated Goal: to continue exercising at home to get stronger and heal well OT Goal Formulation: With patient Time For Goal Achievement: 05/27/23 Potential to Achieve Goals: Good ADL Goals Pt Will Perform Grooming: with modified independence;standing Pt Will Perform Lower Body Bathing: with modified independence;sitting/lateral leans;sit to/from stand Pt Will Perform Lower Body Dressing: with modified independence;sitting/lateral leans;sit to/from stand Pt Will Transfer to Toilet: with modified independence;ambulating;regular height toilet (with least restrictive AD) Pt Will Perform Toileting - Clothing Manipulation and hygiene: with modified independence;sitting/lateral leans;sit to/from stand Pt/caregiver will Perform Home Exercise Program: Increased  strength;Both right and left upper  extremity;With theraband;Independently;With written HEP provided (Increased activity tolerance) Additional ADL Goal #1: Patient will demonstrate understanding through teach back of training in use of energy conservation strategies for increased safety and independence with functional tasks.   OT Frequency:  Min 2X/week    Co-evaluation PT/OT/SLP Co-Evaluation/Treatment: Yes Reason for Co-Treatment: For patient/therapist safety;Other (comment);To address functional/ADL transfers (For use of medical interpreter services)   OT goals addressed during session: ADL's and self-care      AM-PAC OT "6 Clicks" Daily Activity     Outcome Measure Help from another person eating meals?: None Help from another person taking care of personal grooming?: A Little Help from another person toileting, which includes using toliet, bedpan, or urinal?: A Little Help from another person bathing (including washing, rinsing, drying)?: A Little Help from another person to put on and taking off regular upper body clothing?: A Little Help from another person to put on and taking off regular lower body clothing?: A Little 6 Click Score: 19   End of Session Equipment Utilized During Treatment: Rolling walker (2 wheels) Nurse Communication: Mobility status  Activity Tolerance: Patient tolerated treatment well Patient left: in chair;with call bell/phone within reach  OT Visit Diagnosis: Other abnormalities of gait and mobility (R26.89);Unsteadiness on feet (R26.81);Other (comment) (decreased activity tolerance)                Time: 5409-8119 OT Time Calculation (min): 30 min Charges:  OT General Charges $OT Visit: 1 Visit OT Evaluation $OT Eval Low Complexity: 1 Low  Jasean Ambrosia "Kyle" M., OTR/L, MA Acute Rehab [phone]  Walt Gunner 05/13/2023, 11:15 AM

## 2023-05-13 NOTE — Progress Notes (Signed)
 [facility] PROGRESS NOTE   Katherine Archer JYN:[id number] DOB: 07-11-1955 DOA: 05/11/2023  PCP: Katherine Levine, MD  Brief History: 68 y.o. female with past history of axillobifemoral bypass on 11/21, right femoral endarterectomy, right femoral to above-knee popliteal artery bypass graft, right common femoral and profunda femoris thrombectomy on 1/24 presented to the ER for complaints of persistent pain in the right foot.  Concern was for critical ischemia.  Vascular surgery was consulted.  Patient was hospitalized for further management.   Consultants: Vascular surgery  Procedures:  5/9: #1: Redo right femoral artery exposure #2: Right femoral thrombectomy  #3: Right below-knee popliteal artery exposure #4: Right femoral to below-knee popliteal artery bypass graft with 6 mm external ring PTFE #5: Placement of first 38 cm skin substitute (Kerecis to the right groin    Subjective/Interval History: Patient denies any pain in the right lower extremity.  No nausea vomiting.  Daughter is at the bedside.    Assessment/Plan:  Critical limb ischemia involving right lower extremity Patient with previous history of peripheral artery disease and has undergone bypass surgery as well as thrombectomy previously.  Last intervention was in January 2024. Came in with severe pain in the right foot.  CT angiogram shows occluded right distal SFA/popliteal artery and occluded right femoral to above-knee popliteal artery bypass graft. Patient was seen by vascular surgery.  Started on IV heparin . Patient underwent right femoral thrombectomy and right femoral to below-knee popliteal artery bypass. Management per vascular surgery. Patient not noted to be on any antiplatelet agents currently.  Essential hypertension Continue as needed hydralazine .  ARB on hold.  Monitor blood pressures closely.  Blood pressure is reasonably well-controlled. Supplement potassium today.  Thrombocytopenia Seems to  be chronic at least since 2024.  Platelet count was as low as 83,000 in January 2024.Katherine Archer Continue to monitor.  No evidence of bleeding currently.  Hyperlipidemia Patient has not been taking either the rosuvastatin  or ezetimibe .  Reason is unclear.  ? intolerance. Will need to address this with patient and family.  Tobacco abuse Counseled.  DVT Prophylaxis: On IV heparin  Code Status: Full code Family Communication: Discussed with daughter Disposition Plan: Hopefully return home when stable     Medications: Scheduled:  docusate sodium   100 mg Oral Daily   pantoprazole   40 mg Oral Daily   potassium chloride   40 mEq Oral Once   Continuous:  sodium chloride      sodium chloride  100 mL/hr at 05/12/23 1643   heparin  500 Units/hr (05/13/23 0659)   PRN:sodium chloride , acetaminophen  **OR** acetaminophen , alum & mag hydroxide-simeth, guaiFENesin -dextromethorphan, hydrALAZINE , labetalol , metoprolol  tartrate, morphine  injection, ondansetron , oxyCODONE -acetaminophen , phenol, polyethylene glycol  Antibiotics: Anti-infectives (From admission, onward)    Start     Dose/Rate Route Frequency Ordered Stop   05/12/23 1800  ceFAZolin (ANCEF) IVPB 2g/100 mL premix        2 g 200 mL/hr over 30 Minutes Intravenous Every 8 hours 05/12/23 1620 05/13/23 0235       Objective:  Vital Signs  Vitals:   05/13/23 0000 05/13/23 0200 05/13/23 0325 05/13/23 0400  BP: (!) 145/61 (!) 140/62 (!) 130/59   Pulse: (!) 59 64 62   Resp: 18 20 19 20   Temp: 98.2 F (36.8 C)  99.4 F (37.4 C)   TempSrc: Oral  Oral   SpO2: 99% 98% 100%   Weight:      Height:        Intake/Output Summary (Last 24 hours) at 05/13/2023 8657 Last data filed at  05/13/2023 0659 Gross per 24 hour  Intake 941.57 ml  Output 950 ml  Net -8.43 ml   Filed Weights   05/11/23 2149 05/12/23 1004  Weight: 58.1 kg 56.7 kg    General appearance: Awake alert.  In no distress Resp: Clear to auscultation bilaterally.  Normal  effort Cardio: S1-S2 is normal regular.  No S3-S4.  No rubs murmurs or bruit GI: Abdomen is soft.  Nontender nondistended.  Bowel sounds are present normal.  No masses organomegaly Extremities: Incision sites appear to be clean.  Defer to vascular surgery.   Lab Results:  Data Reviewed: I have personally reviewed following labs and reports of the imaging studies  CBC: Recent Labs  Lab 05/11/23 2158 05/12/23 0542 05/13/23 0344  WBC 7.1 5.9 8.2  NEUTROABS 4.8  --   --   HGB 13.9 13.2 11.1*  HCT 41.7 39.2 33.9*  MCV 93.9 92.0 93.1  PLT 101* 109* 84*    Basic Metabolic Panel: Recent Labs  Lab 05/11/23 2158 05/12/23 0542 05/13/23 0344  NA 135 138 135  K 4.8 3.9 3.5  CL 104 107 106  CO2 21* 21* 22  GLUCOSE 104* 102* 108*  BUN 14 12 9   CREATININE 0.89 0.82 0.88  CALCIUM  9.5 9.5 8.3*    GFR: Estimated Creatinine Clearance: 56.3 mL/min (by C-G formula based on SCr of 0.88 mg/dL).  Coagulation Profile: Recent Labs  Lab 05/11/23 2158  INR 1.1    Radiology Studies: CT ANGIO AO+BIFEM W & OR WO CONTRAST Result Date: 05/12/2023 CLINICAL DATA:  Claudication or leg ischemia. Right leg is unable to bear weight and cold to touch. History of fem-pop bypass on the right leg. EXAM: CT ANGIOGRAPHY OF ABDOMINAL AORTA WITH ILIOFEMORAL RUNOFF TECHNIQUE: Multidetector CT imaging of the abdomen, pelvis and lower extremities was performed using the standard protocol during bolus administration of intravenous contrast. Multiplanar CT image reconstructions and MIPs were obtained to evaluate the vascular anatomy. RADIATION DOSE REDUCTION: This exam was performed according to the departmental dose-optimization program which includes automated exposure control, adjustment of the mA and/or kV according to patient size and/or use of iterative reconstruction technique. CONTRAST:  OMNIPAQUE  IOHEXOL  350 MG/ML SOLN COMPARISON:  CT 11/29/2021 FINDINGS: VASCULAR Aorta: Advanced diffuse mixed density  atherosclerotic plaque in the abdominal aorta without aneurysm or dissection. No hemodynamically significant stenosis. Celiac: No aneurysm, dissection, or hemodynamically significant stenosis. SMA: Moderate narrowing in the proximal SMA secondary to predominantly noncalcified atherosclerotic plaque. No aneurysm or dissection. Renals: Calcified plaque causes moderate narrowing in both renal arteries. No aneurysm or dissection. IMA: Patent. RIGHT Lower Extremity Inflow: Moderate stenosis in the right common iliac artery. 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 Right axillary femoral bypass graft. The femoral to femoral bypass graft is patent. Outflow: Chronic occlusion of the right femoral graft which was supplying a right profunda femoral stent graft which is also nonopacified. There is collateral reconstitution of the distal superficial femoral artery superior to the anastomosis with the profunda femoral stent graft. There is occlusion of the superficial femoral and popliteal arteries distal to the anastomosis with the stent graft. Runoff: Opacification of the proximal posterior tibial artery. No vessels are opacified at the level of the ankle. LEFT Lower Extremity Inflow: Severe narrowing of the left common iliac artery. The left external iliac artery is chronically occluded. Outflow: The femoral-femoral portion of the axillary femoral bypass graft is patent and supplies the diminutive left superficial femoral artery and profunda femoral artery. There is  occlusion of the mid to distal SFA. There is reconstitution of the popliteal artery. Runoff: Patent three vessel runoff to the ankle. Veins: No obvious venous abnormality within the limitations of this arterial phase study. Review of the MIP images confirms the above findings. NON-VASCULAR Lower chest: No acute abnormality. Hepatobiliary: No focal liver abnormality is seen. No gallstones, gallbladder wall thickening, or biliary dilatation. Pancreas:  Unremarkable. No pancreatic ductal dilatation or surrounding inflammatory changes. Spleen: Normal in size without focal abnormality. Adrenals/Urinary Tract: Adrenal glands are unremarkable. Kidneys are normal, without renal calculi, focal lesion, or hydronephrosis. Bladder is unremarkable. Stomach/Bowel: No bowel obstruction or bowel wall thickening. Colonic diverticulosis without diverticulitis. Lymphatic: No lymphadenopathy. Reproductive: Pessary.  Uterus and ovaries are unremarkable. Other: No free intraperitoneal fluid or air. Musculoskeletal: No acute fracture. IMPRESSION: Extensive atherosclerotic disease of the aorta, mesenteric, renal, and lower extremity arteries as described. Changes compared to 11/29/2021 include placement of a right profunda femoral artery stent graft which is occluded and occlusion of the distal superficial femoral artery/popliteal artery. Minimal opacification of the proximal right posterior tibial artery. The right runoff vessels are otherwise nonopacified. Critical Value/emergent results were called by telephone at the time of interpretation on 05/12/2023 at 12:05 am to provider Dr. Leida Puna, who verbally acknowledged these results. Electronically Signed   By: Rozell Cornet M.D.   On: 05/12/2023 00:07       LOS: 1 day   Rashawnda Gaba  [facility] Pager on [email or web address]  05/13/2023, 8:39 AM

## 2023-05-13 NOTE — Progress Notes (Signed)
 PHARMACY - ANTICOAGULATION CONSULT NOTE  Pharmacy Consult for heparin    Indication: ischemic limb   Allergies  Allergen Reactions   Amoxicillin Other (See Comments)    Skin infection on face   Penicillins Other (See Comments)    Shaky and Dizziness 20 years ago    Patient Measurements: Height: 5\' 6"  (167.6 cm) Weight: 56.7 kg (125 lb) IBW/kg (Calculated) : 59.3 HEPARIN  DW (KG): 56.7  Vital Signs: Temp: 99 F (37.2 C) (05/10 0852) Temp Source: Oral (05/10 0852) BP: 155/51 (05/10 0852) Pulse Rate: 62 (05/10 0852)  Labs: Recent Labs    05/11/23 2158 05/12/23 0542 05/12/23 0722 05/13/23 0344 05/13/23 1452  HGB 13.9 13.2  --  11.1*  --   HCT 41.7 39.2  --  33.9*  --   PLT 101* 109*  --  84*  --   APTT 25  --   --   --   --   LABPROT 14.7  --   --   --   --   INR 1.1  --   --   --   --   HEPARINUNFRC  --   --  0.41 <0.10* 0.14*  CREATININE 0.89 0.82  --  0.88  --     Estimated Creatinine Clearance: 56.3 mL/min (by C-G formula based on SCr of 0.88 mg/dL).  Assessment: Patient admitted for limb pain with extensive hx of limb ischemia requiring several interventions. Patient also hypertensive on admission. Not on anticoagulation PTA. Pharmacy consulted to dose heparin  gtt post VVS.  -heparin  level up to 0.14 after increasing heparin  to 750 units/hr   Goal of Therapy:  Heparin  level 0.3-0.7 units/ml Monitor platelets by anticoagulation protocol: Yes   Plan:  -Increase heparin  to 900 units/hr -Heparin  level in 6 hours and daily wth CBC daily  Baxter Limber, PharmD Clinical Pharmacist **Pharmacist phone directory can now be found on [email or web address] ([email or web address]).  Listed under [facility].

## 2023-05-13 NOTE — Evaluation (Signed)
 Physical Therapy Evaluation Patient Details Name: Katherine Archer MRN: [id number] DOB: 08-11-1955 Today's Date: 05/13/2023  History of Present Illness  Katherine Archer is a 68 y.o. female presented 05/11/23 with RLE pain. Acute ischemic RLE; 5/10 R fem-pop BPG; PMH positive for PAD, axillobifemoral bypass, multiple RLE procedures, stroke, HTN, HLD.  Clinical Impression  Patient is s/p above surgery resulting in functional limitations due to the deficits listed below (see PT Problem List). PTA patient was primarily mobilizing without a device, however occasional use of RW vs cane due to prior orthopedic injuries. She currently requires CGA to Supervision for all mobility and was able to walk in her room today.  Patient will benefit from acute skilled PT to increase their independence and safety with mobility to facilitate discharge. Do not anticipate any DME or followup PT upon discharge. She does have 5 steps to enter her home.          If plan is discharge home, recommend the following: Help with stairs or ramp for entrance   Can travel by private vehicle        Equipment Recommendations None recommended by PT  Recommendations for Other Services       Functional Status Assessment Patient has had a recent decline in their functional status and demonstrates the ability to make significant improvements in function in a reasonable and predictable amount of time.     Precautions / Restrictions Precautions Precautions: Fall Recall of Precautions/Restrictions: Intact Restrictions Weight Bearing Restrictions Per Provider Order: No      Mobility  Bed Mobility Overal bed mobility: Needs Assistance Bed Mobility: Supine to Sit     Supine to sit: Supervision, HOB elevated     General bed mobility comments: with increased time and verbal cue to bring hips to EOB    Transfers Overall transfer level: Needs assistance Equipment used: Rolling walker (2 wheels) Transfers: Sit to/from Stand, Bed  to chair/wheelchair/BSC Sit to Stand: Supervision           General transfer comment: Largely Supervision with occasional CGA for safety due to mild balance deficits    Ambulation/Gait Ambulation/Gait assistance: Supervision, Contact guard assist Gait Distance (Feet): 40 Feet Assistive device: Rolling walker (2 wheels) Gait Pattern/deviations: Step-to pattern, Decreased stance time - right, Knee flexed in stance - right, Antalgic   Gait velocity interpretation: <1.8 ft/sec, indicate of risk for recurrent falls   General Gait Details: vc for proper sequencing and not to step forward of the front wheels of RW  Stairs            Wheelchair Mobility     Tilt Bed    Modified Rankin (Stroke Patients Only)       Balance Overall balance assessment: Mild deficits observed, not formally tested                                           Pertinent Vitals/Pain Pain Assessment Pain Assessment: 0-10 Pain Score: 3  Pain Location: R LE Pain Descriptors / Indicators: Aching, Discomfort, Sore, Guarding Pain Intervention(s): Limited activity within patient's tolerance, Monitored during session, Premedicated before session, Repositioned    Home Living Family/patient expects to be discharged to:: Private residence Living Arrangements: Children;Other relatives (daughter, grandson) Available Help at Discharge: Family;Available 24 hours/day Type of Home: Mobile home Home Access: Stairs to enter Entrance Stairs-Rails: Right;Left;Can reach both Entrance Stairs-Number of Steps: 5  Home Layout: One level Home Equipment: Cane - single Librarian, academic (2 wheels)      Prior Function Prior Level of Function : Independent/Modified Independent             Mobility Comments: Pt reports she uses SPC and RW intermittently. pt reports no recent falls. ADLs Comments: Pt typically Independent with ADLs. Pt reports often taking a sponge bath due to the tub/shower  bring small.     Extremity/Trunk Assessment   Upper Extremity Assessment Upper Extremity Assessment: Defer to OT evaluation LUE Deficits / Details: Overall WFL; mildly decreased fine motor control with pt stating this is baseline LUE Coordination: decreased fine motor (mild)    Lower Extremity Assessment Lower Extremity Assessment: RLE deficits/detail RLE Deficits / Details: limited AROM due to pain; AAROM hip/knee flexion to 90, ankle to neutral    Cervical / Trunk Assessment Cervical / Trunk Assessment: Normal  Communication   Communication Communication: No apparent difficulties;Other (comment) Factors Affecting Communication:  (Non-English speaker; phone medical interpreter services used throughout session)    Cognition Arousal: Alert Behavior During Therapy: WFL for tasks assessed/performed   PT - Cognitive impairments: No apparent impairments                         Following commands: Intact       Cueing Cueing Techniques: Verbal cues, Gestural cues, Tactile cues, Visual cues     General Comments General comments (skin integrity, edema, etc.): HR in the mid-70s up to 99 bpm during session.    Exercises General Exercises - Lower Extremity Ankle Circles/Pumps: AROM, 10 reps Heel Slides: AAROM, Right, Other reps (comment) (3)   Assessment/Plan    PT Assessment Patient needs continued PT services  PT Problem List Decreased range of motion;Decreased activity tolerance;Decreased balance;Decreased mobility;Decreased knowledge of use of DME;Pain;Decreased skin integrity       PT Treatment Interventions DME instruction;Gait training;Stair training;Functional mobility training;Therapeutic activities;Therapeutic exercise;Patient/family education    PT Goals (Current goals can be found in the Care Plan section)  Acute Rehab PT Goals Patient Stated Goal: return to walking without a device PT Goal Formulation: With patient Time For Goal Achievement:  05/27/23 Potential to Achieve Goals: Good    Frequency Min 3X/week     Co-evaluation PT/OT/SLP Co-Evaluation/Treatment: Yes Reason for Co-Treatment: For patient/therapist safety;Other (comment);To address functional/ADL transfers (For use of medical interpreter services) PT goals addressed during session: Mobility/safety with mobility;Balance;Proper use of DME;Strengthening/ROM OT goals addressed during session: ADL's and self-care       AM-PAC PT "6 Clicks" Mobility  Outcome Measure Help needed turning from your back to your side while in a flat bed without using bedrails?: None Help needed moving from lying on your back to sitting on the side of a flat bed without using bedrails?: A Little Help needed moving to and from a bed to a chair (including a wheelchair)?: A Little Help needed standing up from a chair using your arms (e.g., wheelchair or bedside chair)?: A Little Help needed to walk in hospital room?: A Little Help needed climbing 3-5 steps with a railing? : A Lot 6 Click Score: 18    End of Session   Activity Tolerance: Patient tolerated treatment well Patient left: in chair;with call bell/phone within reach Nurse Communication: Mobility status PT Visit Diagnosis: Other abnormalities of gait and mobility (R26.89);Pain Pain - Right/Left: Right Pain - part of body: Leg    Time: 3474-2595 PT Time Calculation (min) (ACUTE  ONLY): 50 min   Charges:   PT Evaluation $PT Eval Low Complexity: 1 Low PT Treatments $Gait Training: 8-22 mins PT General Charges $$ ACUTE PT VISIT: 1 Visit          Gayle Kava, PT Acute Rehabilitation Services  Office [phone]   Guilford Leep 05/13/2023, 11:28 AM

## 2023-05-14 ENCOUNTER — Inpatient Hospital Stay (HOSPITAL_COMMUNITY)

## 2023-05-14 DIAGNOSIS — I70221 Atherosclerosis of native arteries of extremities with rest pain, right leg: Secondary | ICD-10-CM | POA: Diagnosis not present

## 2023-05-14 DIAGNOSIS — D696 Thrombocytopenia, unspecified: Secondary | ICD-10-CM | POA: Diagnosis not present

## 2023-05-14 DIAGNOSIS — I159 Secondary hypertension, unspecified: Secondary | ICD-10-CM | POA: Diagnosis not present

## 2023-05-14 LAB — BASIC METABOLIC PANEL WITH GFR
Anion gap: 9 (ref 5–15)
BUN: 6 mg/dL — ABNORMAL LOW (ref 8–23)
CO2: 22 mmol/L (ref 22–32)
Calcium: 9 mg/dL (ref 8.9–10.3)
Chloride: 104 mmol/L (ref 98–111)
Creatinine, Ser: 0.79 mg/dL (ref 0.44–1.00)
GFR, Estimated: >60 mL/min (ref >60)
Glucose, Bld: 104 mg/dL — ABNORMAL HIGH (ref 70–99)
Potassium: 4 mmol/L (ref 3.5–5.1)
Sodium: 135 mmol/L (ref 135–145)

## 2023-05-14 LAB — HEPARIN LEVEL (UNFRACTIONATED)
Heparin Unfractionated: 0.32 [IU]/mL (ref 0.30–0.70)
Heparin Unfractionated: 0.39 [IU]/mL (ref 0.30–0.70)

## 2023-05-14 LAB — CBC
HCT: 34.7 % — ABNORMAL LOW (ref 36.0–46.0)
Hemoglobin: 11.8 g/dL — ABNORMAL LOW (ref 12.0–15.0)
MCH: 31.6 pg (ref 26.0–34.0)
MCHC: 34 g/dL (ref 30.0–36.0)
MCV: 92.8 fL (ref 80.0–100.0)
Platelets: 85 10*3/uL — ABNORMAL LOW (ref 150–400)
RBC: 3.74 MIL/uL — ABNORMAL LOW (ref 3.87–5.11)
RDW: 12.4 % (ref 11.5–15.5)
WBC: 11.6 10*3/uL — ABNORMAL HIGH (ref 4.0–10.5)
nRBC: 0 % (ref 0.0–0.2)

## 2023-05-14 MED ORDER — EZETIMIBE 10 MG PO TABS
10.0000 mg | ORAL_TABLET | Freq: Every day | ORAL | Status: DC
  Administered 2023-05-14 – 2023-05-15 (×2): 10 mg via ORAL
  Filled 2023-05-14 (×2): qty 1

## 2023-05-14 MED ORDER — ROSUVASTATIN CALCIUM 20 MG PO TABS
40.0000 mg | ORAL_TABLET | Freq: Every day | ORAL | Status: DC
  Administered 2023-05-14 – 2023-05-15 (×2): 40 mg via ORAL
  Filled 2023-05-14 (×2): qty 2

## 2023-05-14 MED ORDER — IRBESARTAN 150 MG PO TABS
150.0000 mg | ORAL_TABLET | Freq: Every day | ORAL | Status: DC
  Administered 2023-05-14 – 2023-05-15 (×2): 150 mg via ORAL
  Filled 2023-05-14 (×2): qty 1

## 2023-05-14 NOTE — Progress Notes (Addendum)
 [facility] PROGRESS NOTE   Ryian Ladage YQI:[id number] DOB: 12/26/1955 DOA: 05/11/2023  PCP: Barbar Levine, MD  Brief History: 68 y.o. female with past history of axillobifemoral bypass on 11/21, right femoral endarterectomy, right femoral to above-knee popliteal artery bypass graft, right common femoral and profunda femoris thrombectomy on 1/24 presented to the ER for complaints of persistent pain in the right foot.  Concern was for critical ischemia.  Vascular surgery was consulted.  Patient was hospitalized for further management.   Consultants: Vascular surgery  Procedures:  5/9: #1: Redo right femoral artery exposure #2: Right femoral thrombectomy  #3: Right below-knee popliteal artery exposure #4: Right femoral to below-knee popliteal artery bypass graft with 6 mm external ring PTFE #5: Placement of first 38 cm skin substitute (Kerecis to the right groin    Subjective/Interval History: Patient ambulated to the bathroom with a walker and came back to the bed with minimal assistance from her granddaughter.  Denies any cough.  Denies any dysuria.  Denies diarrhea.  Has been constipated actually.  No abdominal pain.  Appetite is poor.     Assessment/Plan:  Critical limb ischemia involving right lower extremity Patient with previous history of peripheral artery disease and has undergone bypass surgery as well as thrombectomy previously.  Last intervention was in January 2024. Came in with severe pain in the right foot.  CT angiogram shows occluded right distal SFA/popliteal artery and occluded right femoral to above-knee popliteal artery bypass graft. Patient was seen by vascular surgery.  Started on IV heparin . Patient underwent right femoral thrombectomy and right femoral to below-knee popliteal artery bypass. Plan is to discharge on anticoagulants.  Not noted to be on any antiplatelets. Management per vascular surgery.  Low-grade fever Noted to have low-grade fever  over the course of the night.  WBC noted to be slightly higher today.  Patient however asymptomatic for the most part.  She was called having a cough a few days ago but none currently.  Denies any dysuria.  We will proceed with chest x-ray.  Fever could be from recent procedure.  No localizing symptoms reported.  Continue to monitor off of antibiotics.  Essential hypertension Resume ARB.  Hydralazine  as needed.    Thrombocytopenia Seems to be chronic at least since 2024.  Platelet count was as low as 83,000 in January 2024.[name] Continue to monitor.  No evidence of bleeding currently.  Hyperlipidemia Pharmacy has verified the patient has been taking rosuvastatin  and ezetimibe  without any difficulty.  These will be resumed.  Tobacco abuse Counseled.  DVT Prophylaxis: On IV heparin  Code Status: Full code Family Communication: Discussed with granddaughter Disposition Plan: Hopefully return home when stable     Medications: Scheduled:  docusate sodium   100 mg Oral Daily   pantoprazole   40 mg Oral Daily   Continuous:  sodium chloride      heparin  900 Units/hr (05/13/23 1655)   PRN:sodium chloride , acetaminophen  **OR** acetaminophen , alum & mag hydroxide-simeth, guaiFENesin -dextromethorphan, hydrALAZINE , labetalol , metoprolol  tartrate, morphine  injection, ondansetron , oxyCODONE -acetaminophen , phenol, polyethylene glycol  Antibiotics: Anti-infectives (From admission, onward)    Start     Dose/Rate Route Frequency Ordered Stop   05/12/23 1800  ceFAZolin (ANCEF) IVPB 2g/100 mL premix        2 g 200 mL/hr over 30 Minutes Intravenous Every 8 hours 05/12/23 1620 05/13/23 0235       Objective:  Vital Signs  Vitals:   05/13/23 2028 05/14/23 0017 05/14/23 0453 05/14/23 0814  BP: (!) 151/51 (!) 156/52 (!) 170/47 [name])  140/44  Pulse: 61 68 61 60  Resp: 20 20 20 20   Temp: 100 F (37.8 C) 100.2 F (37.9 C) 99.2 F (37.3 C) 99.1 F (37.3 C)  TempSrc: Oral Oral Oral Oral  SpO2: 99% 95%  95% 97%  Weight:      Height:       No intake or output data in the 24 hours ending 05/14/23 0910  Filed Weights   05/11/23 2149 05/12/23 1004  Weight: 58.1 kg 56.7 kg    General appearance: Awake alert.  In no distress Resp: Clear to auscultation bilaterally.  Normal effort Cardio: S1-S2 is normal regular.  No S3-S4.  No rubs murmurs or bruit GI: Abdomen is soft.  Nontender nondistended.  Bowel sounds are present normal.  No masses organomegaly Examination of the right lower extremity deferred to the vascular surgery No obvious focal neurological deficits.  Lab Results:  Data Reviewed: I have personally reviewed following labs and reports of the imaging studies  CBC: Recent Labs  Lab 05/11/23 2158 05/12/23 0542 05/13/23 0344 05/14/23 0326  WBC 7.1 5.9 8.2 11.6*  NEUTROABS 4.8  --   --   --   HGB 13.9 13.2 11.1* 11.8*  HCT 41.7 39.2 33.9* 34.7*  MCV 93.9 92.0 93.1 92.8  PLT 101* 109* 84* 85*    Basic Metabolic Panel: Recent Labs  Lab 05/11/23 2158 05/12/23 0542 05/13/23 0344 05/14/23 0326  NA 135 138 135 135  K 4.8 3.9 3.5 4.0  CL 104 107 106 104  CO2 21* 21* 22 22  GLUCOSE 104* 102* 108* 104*  BUN 14 12 9  6*  CREATININE 0.89 0.82 0.88 0.79  CALCIUM  9.5 9.5 8.3* 9.0    GFR: Estimated Creatinine Clearance: 61.9 mL/min (by C-G formula based on SCr of 0.79 mg/dL).  Coagulation Profile: Recent Labs  Lab 05/11/23 2158  INR 1.1    Radiology Studies: No results found.      LOS: 66 days   Cassity Christian Lyndon Santiago  [facility] Pager on [email or web address]  05/14/2023, 9:10 AM

## 2023-05-14 NOTE — Plan of Care (Signed)
  Problem: Education: Goal: Knowledge of General Education information will improve Description: Including pain rating scale, medication(s)/side effects and non-pharmacologic comfort measures 05/14/2023 1647 by Corlis Dienes, RN Outcome: Progressing 05/14/2023 1646 by Corlis Dienes, RN Outcome: Progressing   Problem: Health Behavior/Discharge Planning: Goal: Ability to manage health-related needs will improve 05/14/2023 1647 by Corlis Dienes, RN Outcome: Progressing 05/14/2023 1646 by Corlis Dienes, RN Outcome: Progressing

## 2023-05-14 NOTE — Plan of Care (Signed)
   Problem: Education: Goal: Knowledge of General Education information will improve Description Including pain rating scale, medication(s)/side effects and non-pharmacologic comfort measures Outcome: Progressing   Problem: Health Behavior/Discharge Planning: Goal: Ability to manage health-related needs will improve Outcome: Progressing

## 2023-05-14 NOTE — Progress Notes (Signed)
 PHARMACY - ANTICOAGULATION CONSULT NOTE  Pharmacy Consult for heparin  Indication: fem-pop occlusion now s/p bypass graft  Labs: Recent Labs    05/11/23 2158 05/12/23 0542 05/12/23 0722 05/13/23 0344 05/13/23 1452 05/13/23 2335  HGB 13.9 13.2  --  11.1*  --   --   HCT 41.7 39.2  --  33.9*  --   --   PLT 101* 109*  --  84*  --   --   APTT 25  --   --   --   --   --   LABPROT 14.7  --   --   --   --   --   INR 1.1  --   --   --   --   --   HEPARINUNFRC  --   --    < > <0.10* 0.14* 0.39  CREATININE 0.89 0.82  --  0.88  --   --    < > = values in this interval not displayed.   Assessment/Plan:  68yo female therapeutic on heparin  after rate change. Will continue infusion at current rate of 900 units/hr and confirm stable with am labs.  Lonnie Roberts, PharmD, BCPS 05/14/2023 12:02 AM

## 2023-05-14 NOTE — Progress Notes (Signed)
 Physical Therapy Treatment Patient Details Name: Katherine Archer MRN: [id number] DOB: May 14, 1955 Today's Date: 05/14/2023   History of Present Illness Katherine Archer is a 68 y.o. female presented 05/11/23 with RLE pain. Work up revealed: acute ischemic RLE, s/p R fem-pop BPG on 5/10. PMH positive for PAD, axillobifemoral bypass, multiple RLE procedures, stroke, HTN, HLD.    PT Comments  Pt able to make great progress with ambulation distance and complete stair training this session. Pt benefits from increased time to get accustomed to RLE in dependent position and gradually progresses wt bearing on RLE with continued mobility. She is dependent on BUE support for stairs, but completed with CGA for safety and no instances of buckling or LOB needing assistance. D/c recommendations remain appropriate.     If plan is discharge home, recommend the following: Help with stairs or ramp for entrance   Can travel by private vehicle        Equipment Recommendations  None recommended by PT    Recommendations for Other Services       Precautions / Restrictions Precautions Precautions: Fall Recall of Precautions/Restrictions: Intact Restrictions Weight Bearing Restrictions Per Provider Order: No Other Position/Activity Restrictions: pt maintaining TDWB initially due to pain, progressed to full WBAT     Mobility  Bed Mobility               General bed mobility comments: pt OOB in recliner at start and end of session    Transfers Overall transfer level: Needs assistance Equipment used: Rolling walker (2 wheels) Transfers: Sit to/from Stand Sit to Stand: Supervision           General transfer comment: supervision with assist for line management only    Ambulation/Gait Ambulation/Gait assistance: Supervision, Contact guard assist Gait Distance (Feet): 150 Feet Assistive device: Rolling walker (2 wheels) Gait Pattern/deviations: Step-to pattern, Decreased stance time - right, Knee  flexed in stance - right, Antalgic Gait velocity: decreased Gait velocity interpretation: <1.31 ft/sec, indicative of household ambulator   General Gait Details: vc for proper sequencing and not to step forward of the front wheels of RW   Stairs Stairs: Yes Stairs assistance: Min assist, Contact guard assist Stair Management: Two rails, Step to pattern, Forwards Number of Stairs: 5 General stair comments: initially minA, progressed to CGA with BUE support. cues for LLE leading ascending and RLE leading descending     Balance Overall balance assessment: Mild deficits observed, not formally tested                                          Communication Communication Communication: No apparent difficulties;Other (comment) Factors Affecting Communication:  (Non-English speaker; ipad interpreter Katherine Archer [phone])  Cognition Arousal: Alert Behavior During Therapy: WFL for tasks assessed/performed   PT - Cognitive impairments: No apparent impairments                       PT - Cognition Comments: use of interpreter, pt cognition WFL for session Following commands: Intact      Cueing Cueing Techniques: Verbal cues, Gestural cues, Tactile cues, Visual cues  Exercises      General Comments General comments (skin integrity, edema, etc.): VSS on RA, used interpreter for session      Pertinent Vitals/Pain Pain Assessment Pain Assessment: 0-10 Pain Score: 2  Pain Location: R LE Pain Descriptors / Indicators: Aching, Discomfort, Sore,  Guarding Pain Intervention(s): Limited activity within patient's tolerance, Monitored during session, Premedicated before session, Repositioned     PT Goals (current goals can now be found in the care plan section) Acute Rehab PT Goals Patient Stated Goal: return to walking without a device PT Goal Formulation: With patient Time For Goal Achievement: 05/27/23 Potential to Achieve Goals: Good Progress towards PT goals:  Progressing toward goals    Frequency    Min 3X/week       AM-PAC PT "6 Clicks" Mobility   Outcome Measure  Help needed turning from your back to your side while in a flat bed without using bedrails?: None Help needed moving from lying on your back to sitting on the side of a flat bed without using bedrails?: A Little Help needed moving to and from a bed to a chair (including a wheelchair)?: A Little Help needed standing up from a chair using your arms (e.g., wheelchair or bedside chair)?: A Little Help needed to walk in hospital room?: A Little Help needed climbing 3-5 steps with a railing? : A Little 6 Click Score: 19    End of Session Equipment Utilized During Treatment: Gait belt;Other (comment) (interpreter) Activity Tolerance: Patient tolerated treatment well Patient left: in chair;with call bell/phone within reach Nurse Communication: Mobility status PT Visit Diagnosis: Other abnormalities of gait and mobility (R26.89);Pain Pain - Right/Left: Right Pain - part of body: Leg     Time: 8469-6295 PT Time Calculation (min) (ACUTE ONLY): 33 min  Charges:    $Gait Training: 8-22 mins $Therapeutic Activity: 8-22 mins PT General Charges $$ ACUTE PT VISIT: 1 Visit                     Barnabas Booth, PT, DPT   Acute Rehabilitation Department Office [phone] Secure Chat Communication Preferred   [name] 05/14/2023, 3:42 PM

## 2023-05-14 NOTE — Progress Notes (Addendum)
  Progress Note    05/14/2023 7:49 AM 2 Days Post-Op  Subjective:  pt states her foot feels better.  RN reports that she is not putting a lot of weight on the foot when she walks.    Tm 100.2 now 99.2 HR 60's-70's  150's-170's systolic 98% RA  Vitals:   05/14/23 0017 05/14/23 0453  BP: (!) 156/52 (!) 170/47  Pulse: 68 61  Resp: 20 20  Temp: 100.2 F (37.9 C) 99.2 F (37.3 C)  SpO2: 95% 95%    Physical Exam: General:  no distress Cardiac:  regular Lungs:  non labored Incisions:  right groin and right lower leg incisions look good Extremities:  brisk doppler flow right DP/PT; brisk doppler flow left DP/PT/pero Ulcerations as pictured.  Right foot    Right foot   Right foot   Left foot   Abdomen:  soft  CBC    Component Value Date/Time   WBC 11.6 (H) 05/14/2023 0326   RBC 3.74 (L) 05/14/2023 0326   HGB 11.8 (L) 05/14/2023 0326   HGB 14.2 03/23/2021 0928   HCT 34.7 (L) 05/14/2023 0326   HCT 42.1 03/23/2021 0928   PLT 85 (L) 05/14/2023 0326   PLT 135 (L) 03/23/2021 0928   MCV 92.8 05/14/2023 0326   MCV 93 03/23/2021 0928   MCH 31.6 05/14/2023 0326   MCHC 34.0 05/14/2023 0326   RDW 12.4 05/14/2023 0326   RDW 12.0 03/23/2021 0928   LYMPHSABS 1.9 05/11/2023 2158   LYMPHSABS 1.9 03/23/2021 0928   MONOABS 0.4 05/11/2023 2158   EOSABS 0.0 05/11/2023 2158   EOSABS 0.3 03/23/2021 0928   BASOSABS 0.0 05/11/2023 2158   BASOSABS 0.0 03/23/2021 0928    BMET    Component Value Date/Time   NA 135 05/14/2023 0326   NA 138 01/15/2020 1128   K 4.0 05/14/2023 0326   CL 104 05/14/2023 0326   CO2 22 05/14/2023 0326   GLUCOSE 104 (H) 05/14/2023 0326   BUN 6 (L) 05/14/2023 0326   BUN 8 01/15/2020 1128   CREATININE 0.79 05/14/2023 0326   CALCIUM  9.0 05/14/2023 0326   GFRNONAA >60 05/14/2023 0326   GFRAA 80 01/15/2020 1128    INR    Component Value Date/Time   INR 1.1 05/11/2023 2158    No intake or output data in the 24 hours ending 05/14/23  0749    Assessment/Plan:  68 y.o. female is s/p:  Right femoral to BK popliteal bypass with PTFE for occluded bypass and rest pain on 05/12/2023 by Dr. Charlotte Cookey   2 Days Post-Op   -pt with brisk doppler flow right DP/PT; ulcerations on both feet.  Clean -incisions look good.   -leukocytosis-slight increase to 11.6k from 8.2 and low grade fever overnight.  Needs IS every hour and mobilize out of bed. Incisions look fine. Ulcers clean without evidence of infection -thrombocytopenia is stable.  -DVT prophylaxis:  heparin  gtt.  Will need to discharge on DOAC. I put in consult to [facility] for pricing of Xarelto/Eliquis.  Most likely home tomorrow. -PT/OT with no recommendations for follow up.    Maryanna Smart, PA-C [facility] [phone] 05/14/2023 7:49 AM  I have independently interviewed and examined patient and agree with PA assessment and plan above.  Palpable anterior tibial pulse of the right ankle.  Plan as above.  Roby Spalla C. Vikki Graves, MD [facility] [place] Office: [phone] Pager: [phone]

## 2023-05-14 NOTE — Progress Notes (Signed)
 PHARMACY - ANTICOAGULATION CONSULT NOTE  Pharmacy Consult for heparin    Indication: ischemic limb   Allergies  Allergen Reactions   Amoxicillin Other (See Comments)    Skin infection on face   Penicillins Other (See Comments)    Shaky and Dizziness 20 years ago    Patient Measurements: Height: 5\' 6"  (167.6 cm) Weight: 56.7 kg (125 lb) IBW/kg (Calculated) : 59.3 HEPARIN  DW (KG): 56.7  Vital Signs: Temp: 99.2 F (37.3 C) (05/11 0453) Temp Source: Oral (05/11 0453) BP: 170/47 (05/11 0453) Pulse Rate: 61 (05/11 0453)  Labs: Recent Labs    05/11/23 2158 05/12/23 0542 05/12/23 0722 05/13/23 0344 05/13/23 1452 05/13/23 2335 05/14/23 0326  HGB 13.9 13.2  --  11.1*  --   --  11.8*  HCT 41.7 39.2  --  33.9*  --   --  34.7*  PLT 101* 109*  --  84*  --   --  85*  APTT 25  --   --   --   --   --   --   LABPROT 14.7  --   --   --   --   --   --   INR 1.1  --   --   --   --   --   --   HEPARINUNFRC  --   --    < > <0.10* 0.14* 0.39 0.32  CREATININE 0.89 0.82  --  0.88  --   --  0.79   < > = values in this interval not displayed.    Estimated Creatinine Clearance: 61.9 mL/min (by C-G formula based on SCr of 0.79 mg/dL).  Assessment: Patient admitted for limb pain with extensive hx of limb ischemia requiring several interventions. Patient also hypertensive on admission. Not on anticoagulation PTA. Pharmacy consulted to dose heparin  gtt post VVS.  This morning, heparin  therapeutic (0.32) on 900 units/hour. CBC okay post-surgery - hgb 13.2 > 11.8 and plts 109 > 85. No signs/symptoms of bleeding or issues with the infusion. Since heparin  level is closer to the lower end of goal, could consider increasing rate but holding off for now. Will reassess tomorrow.   Goal of Therapy:  Heparin  level 0.3-0.7 units/ml Monitor platelets by anticoagulation protocol: Yes   Plan:  Continue heparin  at 900 units/hr Monitor daily heparin  level, CBC, and signs/symptoms of bleeding   Adaline Ada, PharmD PGY1 Pharmacy Resident 05/14/2023 7:22 AM   Please check AMION for all [facility] Pharmacy phone numbers After 10:00 PM, call Main Pharmacy [phone]

## 2023-05-15 ENCOUNTER — Telehealth (HOSPITAL_COMMUNITY): Payer: Self-pay | Admitting: Pharmacy Technician

## 2023-05-15 ENCOUNTER — Other Ambulatory Visit (HOSPITAL_COMMUNITY): Payer: Self-pay

## 2023-05-15 ENCOUNTER — Encounter (HOSPITAL_COMMUNITY): Payer: Self-pay | Admitting: Surgery

## 2023-05-15 DIAGNOSIS — I70221 Atherosclerosis of native arteries of extremities with rest pain, right leg: Secondary | ICD-10-CM | POA: Diagnosis not present

## 2023-05-15 LAB — BASIC METABOLIC PANEL WITH GFR
Anion gap: 9 (ref 5–15)
BUN: 15 mg/dL (ref 8–23)
CO2: 22 mmol/L (ref 22–32)
Calcium: 8.3 mg/dL — ABNORMAL LOW (ref 8.9–10.3)
Chloride: 108 mmol/L (ref 98–111)
Creatinine, Ser: 0.88 mg/dL (ref 0.44–1.00)
GFR, Estimated: >60 mL/min (ref >60)
Glucose, Bld: 116 mg/dL — ABNORMAL HIGH (ref 70–99)
Potassium: 3.9 mmol/L (ref 3.5–5.1)
Sodium: 139 mmol/L (ref 135–145)

## 2023-05-15 LAB — HEPARIN LEVEL (UNFRACTIONATED): Heparin Unfractionated: 0.17 [IU]/mL — ABNORMAL LOW (ref 0.30–0.70)

## 2023-05-15 LAB — CBC
HCT: 31.2 % — ABNORMAL LOW (ref 36.0–46.0)
Hemoglobin: 10.2 g/dL — ABNORMAL LOW (ref 12.0–15.0)
MCH: 31.3 pg (ref 26.0–34.0)
MCHC: 32.7 g/dL (ref 30.0–36.0)
MCV: 95.7 fL (ref 80.0–100.0)
Platelets: 76 10*3/uL — ABNORMAL LOW (ref 150–400)
RBC: 3.26 MIL/uL — ABNORMAL LOW (ref 3.87–5.11)
RDW: 12.2 % (ref 11.5–15.5)
WBC: 8.2 10*3/uL (ref 4.0–10.5)
nRBC: 0 % (ref 0.0–0.2)

## 2023-05-15 MED ORDER — PROPOFOL 1000 MG/100ML IV EMUL
INTRAVENOUS | Status: AC
  Filled 2023-05-15: qty 200

## 2023-05-15 MED ORDER — PHENYLEPHRINE HCL-NACL 20-0.9 MG/250ML-% IV SOLN
INTRAVENOUS | Status: AC
  Filled 2023-05-15: qty 500

## 2023-05-15 MED ORDER — APIXABAN 5 MG PO TABS
5.0000 mg | ORAL_TABLET | Freq: Two times a day (BID) | ORAL | Status: DC
  Administered 2023-05-15: 5 mg via ORAL
  Filled 2023-05-15: qty 1

## 2023-05-15 MED ORDER — OXYCODONE-ACETAMINOPHEN 5-325 MG PO TABS
1.0000 | ORAL_TABLET | Freq: Three times a day (TID) | ORAL | 0 refills | Status: DC | PRN
  Filled 2023-05-15: qty 15, 5d supply, fill #0

## 2023-05-15 MED ORDER — POLYETHYLENE GLYCOL 3350 17 GM/SCOOP PO POWD
17.0000 g | Freq: Every day | ORAL | 0 refills | Status: DC | PRN
  Filled 2023-05-15: qty 238, 14d supply, fill #0

## 2023-05-15 MED ORDER — ACETAMINOPHEN 325 MG PO TABS: 650.0000 mg | ORAL_TABLET | ORAL | Status: DC | PRN

## 2023-05-15 MED ORDER — APIXABAN 5 MG PO TABS
5.0000 mg | ORAL_TABLET | Freq: Two times a day (BID) | ORAL | 0 refills | Status: DC
  Filled 2023-05-15: qty 180, 90d supply, fill #0

## 2023-05-15 NOTE — Progress Notes (Signed)
 PHARMACY - ANTICOAGULATION CONSULT NOTE  Pharmacy Consult for heparin  Indication: fem-pop occlusion now s/p bypass graft  Labs: Recent Labs    05/12/23 0542 05/12/23 0722 05/13/23 0344 05/13/23 1452 05/13/23 2335 05/14/23 0326 05/15/23 0406  HGB 13.2  --  11.1*  --   --  11.8*  --   HCT 39.2  --  33.9*  --   --  34.7*  --   PLT 109*  --  84*  --   --  85*  --   HEPARINUNFRC  --    < > <0.10*   < > 0.39 0.32 0.17*  CREATININE 0.82  --  0.88  --   --  0.79 0.88   < > = values in this interval not displayed.   Assessment: 68yo female subtherapeutic on heparin  after two levels at low end of goal, had been trending down; no infusion issues or signs of bleeding per RN.  Goal of Therapy:  Heparin  level 0.3-0.7 units/ml   Plan:  Increase heparin  infusion by 3 units/kg/hr to 1050 units/hr. Check level in 8 hours.   Lonnie Roberts, PharmD, BCPS 05/15/2023 5:14 AM

## 2023-05-15 NOTE — Progress Notes (Addendum)
  Progress Note    05/15/2023 8:01 AM 3 Days Post-Op  Subjective:  feeling okay today, no complaints   Vitals:   05/15/23 0305 05/15/23 0750  BP: (!) 143/105 (!) 167/63  Pulse: 68 63  Resp: 20 16  Temp: 98.6 F (37 C) 98.4 F (36.9 C)  SpO2: 96% 99%   Physical Exam: Cardiac:  regular  Lungs:  non labored Incisions:  Right groin and right lower extremity incisions, right calf soft. Stable ulcers Extremities:  brisk doppler right DP/PT/Pero, left DP/PT Neurologic: alert and oriented   CBC    Component Value Date/Time   WBC 8.2 05/15/2023 0406   RBC 3.26 (L) 05/15/2023 0406   HGB 10.2 (L) 05/15/2023 0406   HGB 14.2 03/23/2021 0928   HCT 31.2 (L) 05/15/2023 0406   HCT 42.1 03/23/2021 0928   PLT 76 (L) 05/15/2023 0406   PLT 135 (L) 03/23/2021 0928   MCV 95.7 05/15/2023 0406   MCV 93 03/23/2021 0928   MCH 31.3 05/15/2023 0406   MCHC 32.7 05/15/2023 0406   RDW 12.2 05/15/2023 0406   RDW 12.0 03/23/2021 0928   LYMPHSABS 1.9 05/11/2023 2158   LYMPHSABS 1.9 03/23/2021 0928   MONOABS 0.4 05/11/2023 2158   EOSABS 0.0 05/11/2023 2158   EOSABS 0.3 03/23/2021 0928   BASOSABS 0.0 05/11/2023 2158   BASOSABS 0.0 03/23/2021 0928    BMET    Component Value Date/Time   NA 139 05/15/2023 0406   NA 138 01/15/2020 1128   K 3.9 05/15/2023 0406   CL 108 05/15/2023 0406   CO2 22 05/15/2023 0406   GLUCOSE 116 (H) 05/15/2023 0406   BUN 15 05/15/2023 0406   BUN 8 01/15/2020 1128   CREATININE 0.88 05/15/2023 0406   CALCIUM  8.3 (L) 05/15/2023 0406   GFRNONAA >60 05/15/2023 0406   GFRAA 80 01/15/2020 1128    INR    Component Value Date/Time   INR 1.1 05/11/2023 2158    No intake or output data in the 24 hours ending 05/15/23 0801   Assessment/Plan:  68 y.o. female is s/p Right femoral to BK popliteal bypass with PTFE for occluded bypass and rest pain on 05/12/2023  3 Days Post-Op   BLE well perfused and warm  Doppler Right DP/ PT/ Pero, left DP/PT Incisions intact and  well appearing H&H stable Afebrile Mobilize as tolerated Stable from vascular standpoint Will arrange outpatient follow up in 2-3 weeks for incision check   Deneen Finical, PA-C [facility] [phone] 05/15/2023 8:01 AM   I agree with the above.  I have seen and evaluated the patient and agree with the above plan.  Stable for discharge from a vascular perspective  Gareld June

## 2023-05-15 NOTE — Progress Notes (Signed)
 PHARMACY - ANTICOAGULATION CONSULT NOTE  Pharmacy Consult for heparin  to Eliquis Indication: fem-pop occlusion now s/p bypass graft  Labs: Recent Labs    05/13/23 0344 05/13/23 1452 05/13/23 2335 05/14/23 0326 05/15/23 0406  HGB 11.1*  --   --  11.8* 10.2*  HCT 33.9*  --   --  34.7* 31.2*  PLT 84*  --   --  85* 76*  HEPARINUNFRC <0.10*   < > 0.39 0.32 0.17*  CREATININE 0.88  --   --  0.79 0.88   < > = values in this interval not displayed.   Assessment: 68yo female subtherapeutic on heparin  after two levels at low end of goal, had been trending down; no infusion issues or signs of bleeding per RN.  Pharmacy has been consulted to transition fro IV heparin  to PO Eliquis.    Plan:  D/C heparin  infusion Start Eliquis 5mg  BID  Monitor for s/sx of bleeidng  Mohammed Andrew, PharmD Clinical Pharmacist 05/15/2023 10:22 AM Please check AMION for all [facility] Pharmacy numbers

## 2023-05-15 NOTE — Plan of Care (Addendum)
 Discharge instructions discussed with patient.  Patient instructed on home medications, restrictions, and follow up appointments. Belongings gathered and sent with patient.  Patients medications picked up at [facility] pharmacy.  Interpreter Thomas [id number] used for all of the d/c instructions.

## 2023-05-15 NOTE — Telephone Encounter (Signed)
 Patient Product/process development scientist completed.    The patient is insured through [employer]. Patient has Medicare and is not eligible for a copay card, but may be able to apply for patient assistance or Medicare RX Payment Plan (Patient Must reach out to their plan, if eligible for payment plan), if available.    Ran test claim for Eliquis 5 mg and the current 30 day co-pay is $4.80.  Ran test claim for Xarelto 20 mg and the current 30 day co-pay is $4.80.  This test claim was processed through [facility]- copay amounts may vary at other pharmacies due to pharmacy/plan contracts, or as the patient moves through the different stages of their insurance plan.     Morgan Arab, CPHT Pharmacy Technician III Certified Patient Advocate [facility] Pharm[facility] Patient Advocate Team Direct Number: [phone]  Fax: [phone]

## 2023-05-15 NOTE — Discharge Summary (Signed)
 [facility]  Physician Discharge Summary   Patient ID: Katherine Archer MRN: [id number] DOB/AGE: [birth year] 66 y.o.  Admit date: 05/11/2023 Discharge date: 05/15/2023    PCP: Katherine Levine, MD  DISCHARGE DIAGNOSES:    Critical limb ischemia of right lower extremity (HCC)   PAD (peripheral artery disease) (HCC)   Hyperlipidemia   Hypertension   Mixed hyperlipidemia   Thrombocytopenia (HCC)   RECOMMENDATIONS FOR OUTPATIENT FOLLOW UP: Vascular surgery to schedule outpatient follow-up   Home Health: None Equipment/Devices: None  CODE STATUS: Full code  DISCHARGE CONDITION: fair  Diet recommendation: Heart healthy  INITIAL HISTORY: 68 y.o. female with past history of axillobifemoral bypass on 11/21, right femoral endarterectomy, right femoral to above-knee popliteal artery bypass graft, right common femoral and profunda femoris thrombectomy on 1/24 presented to the ER for complaints of persistent pain in the right foot.  Concern was for critical ischemia.  Vascular surgery was consulted.  Patient was hospitalized for further management.    Consultants: Vascular surgery   Procedures:  5/9: #1: Redo right femoral artery exposure #2: Right femoral thrombectomy  #3: Right below-knee popliteal artery exposure #4: Right femoral to below-knee popliteal artery bypass graft with 6 mm external ring PTFE #5: Placement of first 38 cm skin substitute (Kerecis to the right groin    HOSPITAL COURSE:   Critical limb ischemia involving right lower extremity Patient with previous history of peripheral artery disease and has undergone bypass surgery as well as thrombectomy previously.  Last intervention was in January 2024. Came in with severe pain in the right foot.  CT angiogram shows occluded right distal SFA/popliteal artery and occluded right femoral to above-knee popliteal artery bypass graft. Patient was seen by vascular surgery.  Started on IV heparin . Patient underwent  right femoral thrombectomy and right femoral to below-knee popliteal artery bypass. Transitioned to apixaban at discharge. Cleared by vascular surgery for discharge.   Low-grade fever Noted to have low-grade fever over the course of the night.  WBC noted to be slightly higher on 5/11.  Fever resolved.  WBC noted to be normal this morning.  Fever might have been due to recent procedure.  No source of infection identified.   Essential hypertension   Thrombocytopenia Seems to be chronic at least since 2024.  Platelet count was as low as 83,000 in January 2024.Katherine Archer   Hyperlipidemia Pharmacy has verified the patient has been taking rosuvastatin  and ezetimibe  without any difficulty.  These will be resumed.   Tobacco abuse Counseled.   Patient is stable.  Okay for discharge home today.  Discussed with her daughter.   PERTINENT LABS:  The results of significant diagnostics from this hospitalization (including imaging, microbiology, ancillary and laboratory) are listed below for reference.    Microbiology: Recent Results (from the past 240 hours)  Surgical pcr screen     Status: None   Collection Time: 05/12/23  8:13 AM   Specimen: Nasal Mucosa; Nasal Swab  Result Value Ref Range Status   MRSA, PCR NEGATIVE NEGATIVE Final   Staphylococcus aureus NEGATIVE NEGATIVE Final    Comment: (NOTE) The Xpert SA Assay (FDA approved for NASAL specimens in patients 29 years of age and older), is one component of a comprehensive surveillance program. It is not intended to diagnose infection nor to guide or monitor treatment. Performed at [facility], [address] [place], [place] [address]      Labs:   Basic Metabolic Panel: Recent Labs  Lab 05/11/23 2158 05/12/23 0542 05/13/23  0344 05/14/23 0326 05/15/23 0406  NA 135 138 135 135 139  K 4.8 3.9 3.5 4.0 3.9  CL 104 107 106 104 108  CO2 21* 21* 22 22 22   GLUCOSE 104* 102* 108* 104* 116*  BUN 14 12 9  6* 15  CREATININE 0.89  0.82 0.88 0.79 0.88  CALCIUM  9.5 9.5 8.3* 9.0 8.3*    CBC: Recent Labs  Lab 05/11/23 2158 05/12/23 0542 05/13/23 0344 05/14/23 0326 05/15/23 0406  WBC 7.1 5.9 8.2 11.6* 8.2  NEUTROABS 4.8  --   --   --   --   HGB 13.9 13.2 11.1* 11.8* 10.2*  HCT 41.7 39.2 33.9* 34.7* 31.2*  MCV 93.9 92.0 93.1 92.8 95.7  PLT 101* 109* 84* 85* 76*     IMAGING STUDIES DG CHEST PORT 1 VIEW Result Date: 05/14/2023 CLINICAL DATA:  [id number] Fever [id number] EXAM: PORTABLE CHEST 1 VIEW COMPARISON:  November 29, 2021 FINDINGS: The cardiomediastinal silhouette is unchanged in contour.Atherosclerotic calcifications. No pleural effusion. No pneumothorax. No acute pleuroparenchymal abnormality. Chronic superior subluxation of the RIGHT clavicle in relation to the scapula. IMPRESSION: No acute cardiopulmonary abnormality. Electronically Signed   By: Clancy Crimes M.D.   On: 05/14/2023 11:33   CT ANGIO AO+BIFEM W & OR WO CONTRAST Result Date: 05/12/2023 CLINICAL DATA:  Claudication or leg ischemia. Right leg is unable to bear weight and cold to touch. History of fem-pop bypass on the right leg. EXAM: CT ANGIOGRAPHY OF ABDOMINAL AORTA WITH ILIOFEMORAL RUNOFF TECHNIQUE: Multidetector CT imaging of the abdomen, pelvis and lower extremities was performed using the standard protocol during bolus administration of intravenous contrast. Multiplanar CT image reconstructions and MIPs were obtained to evaluate the vascular anatomy. RADIATION DOSE REDUCTION: This exam was performed according to the departmental dose-optimization program which includes automated exposure control, adjustment of the mA and/or kV according to patient size and/or use of iterative reconstruction technique. CONTRAST:  OMNIPAQUE  IOHEXOL  350 MG/ML SOLN COMPARISON:  CT 11/29/2021 FINDINGS: VASCULAR Aorta: Advanced diffuse mixed density atherosclerotic plaque in the abdominal aorta without aneurysm or dissection. No hemodynamically significant stenosis.  Celiac: No aneurysm, dissection, or hemodynamically significant stenosis. SMA: Moderate narrowing in the proximal SMA secondary to predominantly noncalcified atherosclerotic plaque. No aneurysm or dissection. Renals: Calcified plaque causes moderate narrowing in both renal arteries. No aneurysm or dissection. IMA: Patent. RIGHT Lower Extremity Inflow: Moderate stenosis in the right common iliac artery. 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 Right axillary femoral bypass graft. The femoral to femoral bypass graft is patent. Outflow: Chronic occlusion of the right femoral graft which was supplying a right profunda femoral stent graft which is also nonopacified. There is collateral reconstitution of the distal superficial femoral artery superior to the anastomosis with the profunda femoral stent graft. There is occlusion of the superficial femoral and popliteal arteries distal to the anastomosis with the stent graft. Runoff: Opacification of the proximal posterior tibial artery. No vessels are opacified at the level of the ankle. LEFT Lower Extremity Inflow: Severe narrowing of the left common iliac artery. The left external iliac artery is chronically occluded. Outflow: The femoral-femoral portion of the axillary femoral bypass graft is patent and supplies the diminutive left superficial femoral artery and profunda femoral artery. There is occlusion of the mid to distal SFA. There is reconstitution of the popliteal artery. Runoff: Patent three vessel runoff to the ankle. Veins: No obvious venous abnormality within the limitations of this arterial phase study. Review of the MIP images confirms the  above findings. NON-VASCULAR Lower chest: No acute abnormality. Hepatobiliary: No focal liver abnormality is seen. No gallstones, gallbladder wall thickening, or biliary dilatation. Pancreas: Unremarkable. No pancreatic ductal dilatation or surrounding inflammatory changes. Spleen: Normal in size without focal  abnormality. Adrenals/Urinary Tract: Adrenal glands are unremarkable. Kidneys are normal, without renal calculi, focal lesion, or hydronephrosis. Bladder is unremarkable. Stomach/Bowel: No bowel obstruction or bowel wall thickening. Colonic diverticulosis without diverticulitis. Lymphatic: No lymphadenopathy. Reproductive: Pessary.  Uterus and ovaries are unremarkable. Other: No free intraperitoneal fluid or air. Musculoskeletal: No acute fracture. IMPRESSION: Extensive atherosclerotic disease of the aorta, mesenteric, renal, and lower extremity arteries as described. Changes compared to 11/29/2021 include placement of a right profunda femoral artery stent graft which is occluded and occlusion of the distal superficial femoral artery/popliteal artery. Minimal opacification of the proximal right posterior tibial artery. The right runoff vessels are otherwise nonopacified. Critical Value/emergent results were called by telephone at the time of interpretation on 05/12/2023 at 12:05 am to provider Dr. Leida Puna, who verbally acknowledged these results. Electronically Signed   By: Rozell Cornet M.D.   On: 05/12/2023 00:07    DISCHARGE EXAMINATION: Vitals:   05/15/23 0305 05/15/23 0750 05/15/23 0805 05/15/23 1148  BP: (!) 143/105 (!) 167/63 (!) 164/59 (!) 159/47  Pulse: 68 63 70 66  Resp: 20 16 19 17   Temp: 98.6 F (37 C) 98.4 F (36.9 C)  98.1 F (36.7 C)  TempSrc: Oral Oral  Oral  SpO2: 96% 99%  97%  Weight:      Height:       General appearance: Awake alert.  In no distress Resp: Clear to auscultation bilaterally.  Normal effort Cardio: S1-S2 is normal regular.  No S3-S4.  No rubs murmurs or bruit GI: Abdomen is soft.  Nontender nondistended.  Bowel sounds are present normal.  No masses organomegaly   DISPOSITION: Home  Discharge Instructions     Call MD for:  difficulty breathing, headache or visual disturbances   Complete by: As directed    Call MD for:  extreme fatigue   Complete by: As  directed    Call MD for:  persistant dizziness or light-headedness   Complete by: As directed    Call MD for:  persistant nausea and vomiting   Complete by: As directed    Call MD for:  severe uncontrolled pain   Complete by: As directed    Call MD for:  temperature >100.4   Complete by: As directed    Diet - low sodium heart healthy   Complete by: As directed    Discharge instructions   Complete by: As directed    Please take your medications as prescribed.  Follow-up with vascular surgery as instructed.  Seek attention if you develop fever chills nausea vomiting worsening pain in the right leg or if you see bleeding episodes including blood in the stool blood in the urine or black-colored stool.  You were cared for by a hospitalist during your hospital stay. If you have any questions about your discharge medications or the care you received while you were in the hospital after you are discharged, you can call the unit and asked to speak with the hospitalist on call if the hospitalist that took care of you is not available. Once you are discharged, your primary care physician will handle any further medical issues. Please note that NO REFILLS for any discharge medications will be authorized once you are discharged, as it is imperative that you return to  your primary care physician (or establish a relationship with a primary care physician if you do not have one) for your aftercare needs so that they can reassess your need for medications and monitor your lab values. If you do not have a primary care physician, you can call [phone] for a physician referral.   Increase activity slowly   Complete by: As directed          Allergies as of 05/15/2023       Reactions   Amoxicillin Other (See Comments)   Skin infection on face   Penicillins Other (See Comments)   Shaky and Dizziness 20 years ago        Medication List     STOP taking these medications    aspirin  EC 81 MG tablet        TAKE these medications    acetaminophen  325 MG tablet Commonly known as: TYLENOL  Take 2 tablets (650 mg total) by mouth every 4 (four) hours as needed for mild pain (pain score 1-3).   alendronate 70 MG tablet Commonly known as: FOSAMAX Take 70 mg by mouth once a week.   Eliquis 5 MG Tabs tablet Generic drug: apixaban Take 1 tablet (5 mg total) by mouth 2 (two) times daily.   ezetimibe  10 MG tablet Commonly known as: ZETIA  Take 10 mg by mouth daily.   hydrALAZINE  50 MG tablet Commonly known as: APRESOLINE  Take 1 tablet (50 mg total) by mouth 3 (three) times daily. What changed: how much to take   oxyCODONE -acetaminophen  5-325 MG tablet Commonly known as: PERCOCET/ROXICET Take 1 tablet by mouth every 8 (eight) hours as needed for severe pain (pain score 7-10).   polyethylene glycol powder 17 GM/SCOOP powder Commonly known as: GLYCOLAX /MIRALAX  Take 1 capful (17 g) by mouth daily as needed for mild constipation.   rosuvastatin  40 MG tablet Commonly known as: CRESTOR  Take 40 mg by mouth daily.   Tiadylt  ER 180 MG 24 hr capsule Generic drug: diltiazem  Take 180 mg by mouth daily.   valsartan 160 MG tablet Commonly known as: DIOVAN Take 160 mg by mouth daily.          Follow-up Information     [facility] Follow up.   Specialty: Vascular Surgery Why: 2-3 weeks for follow up.The office will call you with your appointment Contact information: [address], [address] [place]  [phone]                TOTAL DISCHARGE TIME: 35 minutes  Sabriah Hobbins Lyndon Santiago  [facility] Pager on [email or web address]  05/16/2023, 11:21 AM

## 2023-05-15 NOTE — TOC Transition Note (Signed)
 Transition of Care (TOC) - Discharge Note Sherin Dingwall RN, BSN Transitions of Care Unit 4E- RN Case Manager See Treatment Team for direct phone #   Patient Details  Name: Katherine Archer MRN: [id number] Date of Birth: June 29, 1955  Transition of Care [facility]) CM/SW Contact:  Rox Cope, RN Phone Number: 05/15/2023, 11:59 AM   Clinical Narrative:    Pt stable for transition home today, will be started on Eliquis- per benefits check- copay $4.80.  No HH or DME needs noted. Family to transport home.    Final next level of care: Home/Self Care Barriers to Discharge: No Barriers Identified   Patient Goals and CMS Choice Patient states their goals for this hospitalization and ongoing recovery are:: return home   Choice offered to / list presented to : NA      Discharge Placement                 Home      Discharge Plan and Services Additional resources added to the After Visit Summary for     Discharge Planning Services: CM Consult, Medication Assistance Post Acute Care Choice: NA          DME Arranged: N/A DME Agency: NA         HH Agency: NA        Social Drivers of Health (SDOH) Interventions SDOH Screenings   Food Insecurity: No Food Insecurity (05/12/2023)  Housing: Low Risk  (05/12/2023)  Transportation Needs: No Transportation Needs (05/12/2023)  Utilities: Not At Risk (05/12/2023)  Depression (PHQ2-9): Low Risk  (06/29/2020)  Social Connections: Socially Isolated (05/12/2023)  Tobacco Use: Medium Risk (05/12/2023)     Readmission Risk Interventions    05/15/2023   11:59 AM 01/28/2022   11:32 AM  Readmission Risk Prevention Plan  Post Dischage Appt Complete Complete  Medication Screening Complete Complete  Transportation Screening Complete Complete

## 2023-05-15 NOTE — Discharge Instructions (Signed)

## 2023-05-15 NOTE — Plan of Care (Signed)

## 2023-05-15 NOTE — Progress Notes (Signed)
 Occupational Therapy Treatment Patient Details Name: Katherine Archer MRN: [id number] DOB: 08/26/1955 Today's Date: 05/15/2023   History of present illness Katherine Archer is a 68 y.o. female presented 05/11/23 with RLE pain. Work up revealed: acute ischemic RLE, s/p R fem-pop BPG on 5/10. PMH positive for PAD, axillobifemoral bypass, multiple RLE procedures, stroke, HTN, HLD.   OT comments  Pt ambulated to bathroom, toileted and completed one grooming task standing at sink with supervision and RW. Able to return to supine without assist. Educated pt to dress R LE first and then L. She is able to remove her R sock and pull it up, will rely on her daughter to start her sock over her toes until pain allows her to return to independence. Pt eager to return home today.       If plan is discharge home, recommend the following:  A little help with walking and/or transfers;A little help with bathing/dressing/bathroom;Assistance with cooking/housework;Assist for transportation;Help with stairs or ramp for entrance   Equipment Recommendations  None recommended by OT    Recommendations for Other Services      Precautions / Restrictions Precautions Precautions: Fall Recall of Precautions/Restrictions: Intact Restrictions Weight Bearing Restrictions Per Provider Order: No       Mobility Bed Mobility Overal bed mobility: Modified Independent             General bed mobility comments: seated EOB with R LE propped on walker,returned to supine without assist at end of session    Transfers Overall transfer level: Needs assistance Equipment used: Rolling walker (2 wheels) Transfers: Sit to/from Stand Sit to Stand: Supervision                 Balance                                           ADL either performed or assessed with clinical judgement   ADL Overall ADL's : Needs assistance/impaired     Grooming: Wash/dry hands;Standing;Supervision/safety                Lower Body Dressing: Minimal assistance;Sitting/lateral leans   Toilet Transfer: Supervision/safety;Ambulation;Rolling walker (2 wheels)   Toileting- Clothing Manipulation and Hygiene: Set up;Sitting/lateral lean       Functional mobility during ADLs: Supervision/safety;Rolling walker (2 wheels)      Extremity/Trunk Assessment              Vision       Perception     Praxis     Communication Communication Communication: No apparent difficulties;Other (comment) Factors Affecting Communication: Non - English speaking, interpreter not available (interpreter used)   Cognition Arousal: Alert Behavior During Therapy: WFL for tasks assessed/performed Cognition: No apparent impairments                               Following commands: Intact        Cueing   Cueing Techniques: Verbal cues  Exercises      Shoulder Instructions       General Comments      Pertinent Vitals/ Pain       Pain Assessment Pain Assessment: Faces Faces Pain Scale: Hurts little more Pain Location: R LE Pain Descriptors / Indicators: Grimacing, Guarding, Sore Pain Intervention(s): Monitored during session, Repositioned  Home Living  Prior Functioning/Environment              Frequency  Min 2X/week        Progress Toward Goals  OT Goals(current goals can now be found in the care plan section)  Progress towards OT goals: Progressing toward goals  Acute Rehab OT Goals OT Goal Formulation: With patient Time For Goal Achievement: 05/27/23 Potential to Achieve Goals: Good  Plan      Co-evaluation                 AM-PAC OT "6 Clicks" Daily Activity     Outcome Measure   Help from another person eating meals?: None Help from another person taking care of personal grooming?: A Little Help from another person toileting, which includes using toliet, bedpan, or urinal?: A Little Help from  another person bathing (including washing, rinsing, drying)?: A Little Help from another person to put on and taking off regular upper body clothing?: None Help from another person to put on and taking off regular lower body clothing?: A Little 6 Click Score: 20    End of Session Equipment Utilized During Treatment: Rolling walker (2 wheels);Gait belt  OT Visit Diagnosis: Pain;Unsteadiness on feet (R26.81)   Activity Tolerance Patient tolerated treatment well   Patient Left in bed;with call bell/phone within reach   Nurse Communication          Time: 1105-1120 OT Time Calculation (min): 15 min  Charges: OT General Charges $OT Visit: 1 Visit OT Treatments $Self Care/Home Management : 8-22 mins Katherine Archer, OTR/L Acute Rehabilitation Services Office: [phone]   Katherine Archer 05/15/2023, 11:38 AM

## 2023-05-23 DIAGNOSIS — I358 Other nonrheumatic aortic valve disorders: Secondary | ICD-10-CM | POA: Diagnosis not present

## 2023-05-23 DIAGNOSIS — Z789 Other specified health status: Secondary | ICD-10-CM | POA: Diagnosis not present

## 2023-05-23 DIAGNOSIS — E785 Hyperlipidemia, unspecified: Secondary | ICD-10-CM | POA: Diagnosis not present

## 2023-05-23 DIAGNOSIS — Z6820 Body mass index (BMI) 20.0-20.9, adult: Secondary | ICD-10-CM | POA: Diagnosis not present

## 2023-05-23 DIAGNOSIS — I70221 Atherosclerosis of native arteries of extremities with rest pain, right leg: Secondary | ICD-10-CM | POA: Diagnosis not present

## 2023-05-23 DIAGNOSIS — D696 Thrombocytopenia, unspecified: Secondary | ICD-10-CM | POA: Diagnosis not present

## 2023-05-26 ENCOUNTER — Telehealth: Payer: Self-pay

## 2023-05-26 DIAGNOSIS — I361 Nonrheumatic tricuspid (valve) insufficiency: Secondary | ICD-10-CM | POA: Diagnosis not present

## 2023-05-26 DIAGNOSIS — I358 Other nonrheumatic aortic valve disorders: Secondary | ICD-10-CM | POA: Diagnosis not present

## 2023-05-26 NOTE — Telephone Encounter (Signed)
 Pt's daughter, [name], call reporting that her mom has been running a fever and having diarrhea for two days.  She also reports that her groin incision has serosanguinous drainage.  Pt was advised to go to the [facility] ED for asssessment.  Pt's daughter agreed she would take her mother to the [facility] ED.

## 2023-05-28 ENCOUNTER — Emergency Department (HOSPITAL_COMMUNITY)

## 2023-05-28 ENCOUNTER — Other Ambulatory Visit: Payer: Self-pay

## 2023-05-28 ENCOUNTER — Encounter (HOSPITAL_COMMUNITY): Payer: Self-pay | Admitting: Emergency Medicine

## 2023-05-28 ENCOUNTER — Inpatient Hospital Stay (HOSPITAL_COMMUNITY)
Admission: EM | Admit: 2023-05-28 | Discharge: 2023-06-07 | DRG: 239 | Disposition: A | Discharge status: Rehabilitation Facility | Attending: Family Medicine | Admitting: Family Medicine

## 2023-05-28 DIAGNOSIS — I10 Essential (primary) hypertension: Secondary | ICD-10-CM | POA: Diagnosis present

## 2023-05-28 DIAGNOSIS — R7881 Bacteremia: Secondary | ICD-10-CM | POA: Insufficient documentation

## 2023-05-28 DIAGNOSIS — D62 Acute posthemorrhagic anemia: Secondary | ICD-10-CM | POA: Diagnosis present

## 2023-05-28 DIAGNOSIS — Z8673 Personal history of transient ischemic attack (TIA), and cerebral infarction without residual deficits: Secondary | ICD-10-CM

## 2023-05-28 DIAGNOSIS — I739 Peripheral vascular disease, unspecified: Secondary | ICD-10-CM | POA: Diagnosis present

## 2023-05-28 DIAGNOSIS — Z603 Acculturation difficulty: Secondary | ICD-10-CM | POA: Diagnosis present

## 2023-05-28 DIAGNOSIS — G546 Phantom limb syndrome with pain: Secondary | ICD-10-CM | POA: Diagnosis not present

## 2023-05-28 DIAGNOSIS — T827XXA Infection and inflammatory reaction due to other cardiac and vascular devices, implants and grafts, initial encounter: Secondary | ICD-10-CM | POA: Diagnosis not present

## 2023-05-28 DIAGNOSIS — M79604 Pain in right leg: Secondary | ICD-10-CM | POA: Diagnosis not present

## 2023-05-28 DIAGNOSIS — Z87891 Personal history of nicotine dependence: Secondary | ICD-10-CM

## 2023-05-28 DIAGNOSIS — R110 Nausea: Secondary | ICD-10-CM | POA: Diagnosis not present

## 2023-05-28 DIAGNOSIS — B9562 Methicillin resistant Staphylococcus aureus infection as the cause of diseases classified elsewhere: Secondary | ICD-10-CM | POA: Diagnosis present

## 2023-05-28 DIAGNOSIS — R509 Fever, unspecified: Secondary | ICD-10-CM | POA: Diagnosis not present

## 2023-05-28 DIAGNOSIS — R000 Tachycardia, unspecified: Secondary | ICD-10-CM | POA: Diagnosis not present

## 2023-05-28 DIAGNOSIS — D649 Anemia, unspecified: Secondary | ICD-10-CM | POA: Insufficient documentation

## 2023-05-28 DIAGNOSIS — I70261 Atherosclerosis of native arteries of extremities with gangrene, right leg: Secondary | ICD-10-CM | POA: Diagnosis present

## 2023-05-28 DIAGNOSIS — E785 Hyperlipidemia, unspecified: Secondary | ICD-10-CM | POA: Diagnosis present

## 2023-05-28 DIAGNOSIS — T8144XA Sepsis following a procedure, initial encounter: Secondary | ICD-10-CM

## 2023-05-28 DIAGNOSIS — Z743 Need for continuous supervision: Secondary | ICD-10-CM | POA: Diagnosis not present

## 2023-05-28 DIAGNOSIS — R748 Abnormal levels of other serum enzymes: Secondary | ICD-10-CM | POA: Insufficient documentation

## 2023-05-28 DIAGNOSIS — E871 Hypo-osmolality and hyponatremia: Secondary | ICD-10-CM | POA: Diagnosis present

## 2023-05-28 DIAGNOSIS — B955 Unspecified streptococcus as the cause of diseases classified elsewhere: Secondary | ICD-10-CM | POA: Insufficient documentation

## 2023-05-28 DIAGNOSIS — A400 Sepsis due to streptococcus, group A: Secondary | ICD-10-CM | POA: Diagnosis present

## 2023-05-28 DIAGNOSIS — Z66 Do not resuscitate: Secondary | ICD-10-CM | POA: Diagnosis present

## 2023-05-28 DIAGNOSIS — A419 Sepsis, unspecified organism: Principal | ICD-10-CM | POA: Diagnosis present

## 2023-05-28 DIAGNOSIS — Z79899 Other long term (current) drug therapy: Secondary | ICD-10-CM

## 2023-05-28 DIAGNOSIS — Y832 Surgical operation with anastomosis, bypass or graft as the cause of abnormal reaction of the patient, or of later complication, without mention of misadventure at the time of the procedure: Secondary | ICD-10-CM | POA: Diagnosis present

## 2023-05-28 DIAGNOSIS — Z7901 Long term (current) use of anticoagulants: Secondary | ICD-10-CM

## 2023-05-28 DIAGNOSIS — R6521 Severe sepsis with septic shock: Secondary | ICD-10-CM | POA: Diagnosis present

## 2023-05-28 DIAGNOSIS — Z7983 Long term (current) use of bisphosphonates: Secondary | ICD-10-CM

## 2023-05-28 LAB — CBC WITH DIFFERENTIAL/PLATELET
Abs Immature Granulocytes: 1.19 10*3/uL — ABNORMAL HIGH (ref 0.00–0.07)
Basophils Absolute: 0.1 10*3/uL (ref 0.0–0.1)
Basophils Relative: 0 %
Eosinophils Absolute: 0 10*3/uL (ref 0.0–0.5)
Eosinophils Relative: 0 %
HCT: 12.7 % — ABNORMAL LOW (ref 36.0–46.0)
Hemoglobin: 4.4 g/dL — CL (ref 12.0–15.0)
Immature Granulocytes: 4 %
Lymphocytes Relative: 5 %
Lymphs Abs: 1.5 10*3/uL (ref 0.7–4.0)
MCH: 32.4 pg (ref 26.0–34.0)
MCHC: 34.6 g/dL (ref 30.0–36.0)
MCV: 93.4 fL (ref 80.0–100.0)
Monocytes Absolute: 0.9 10*3/uL (ref 0.1–1.0)
Monocytes Relative: 3 %
Neutro Abs: 30.4 10*3/uL — ABNORMAL HIGH (ref 1.7–7.7)
Neutrophils Relative %: 88 %
Platelets: 285 10*3/uL (ref 150–400)
RBC: 1.36 MIL/uL — ABNORMAL LOW (ref 3.87–5.11)
RDW: 13.5 % (ref 11.5–15.5)
Smear Review: NORMAL
WBC: 34.2 10*3/uL — ABNORMAL HIGH (ref 4.0–10.5)
nRBC: 0 % (ref 0.0–0.2)

## 2023-05-28 LAB — COMPREHENSIVE METABOLIC PANEL WITH GFR
ALT: 124 U/L — ABNORMAL HIGH (ref 0–44)
AST: 209 U/L — ABNORMAL HIGH (ref 15–41)
Albumin: 1.8 g/dL — ABNORMAL LOW (ref 3.5–5.0)
Alkaline Phosphatase: 80 U/L (ref 38–126)
Anion gap: 14 (ref 5–15)
BUN: 13 mg/dL (ref 8–23)
CO2: 18 mmol/L — ABNORMAL LOW (ref 22–32)
Calcium: 7.3 mg/dL — ABNORMAL LOW (ref 8.9–10.3)
Chloride: 96 mmol/L — ABNORMAL LOW (ref 98–111)
Creatinine, Ser: 0.9 mg/dL (ref 0.44–1.00)
GFR, Estimated: >60 mL/min (ref >60)
Glucose, Bld: 97 mg/dL (ref 70–99)
Potassium: 3.3 mmol/L — ABNORMAL LOW (ref 3.5–5.1)
Sodium: 128 mmol/L — ABNORMAL LOW (ref 135–145)
Total Bilirubin: 1.3 mg/dL — ABNORMAL HIGH (ref 0.0–1.2)
Total Protein: 4.8 g/dL — ABNORMAL LOW (ref 6.5–8.1)

## 2023-05-28 LAB — URINALYSIS, W/ REFLEX TO CULTURE (INFECTION SUSPECTED)
Bilirubin Urine: NEGATIVE
Glucose, UA: NEGATIVE mg/dL
Ketones, ur: NEGATIVE mg/dL
Leukocytes,Ua: NEGATIVE
Nitrite: NEGATIVE
Protein, ur: 100 mg/dL — AB
Specific Gravity, Urine: 1.014 (ref 1.005–1.030)
pH: 5 (ref 5.0–8.0)

## 2023-05-28 LAB — I-STAT CG4 LACTIC ACID, ED: Lactic Acid, Venous: 1.6 mmol/L (ref 0.5–1.9)

## 2023-05-28 LAB — PREPARE RBC (CROSSMATCH)

## 2023-05-28 LAB — POC OCCULT BLOOD, ED: Fecal Occult Bld: NEGATIVE

## 2023-05-28 LAB — RESP PANEL BY RT-PCR (RSV, FLU A&B, COVID)  RVPGX2
Influenza A by PCR: NEGATIVE
Influenza B by PCR: NEGATIVE
Resp Syncytial Virus by PCR: NEGATIVE
SARS Coronavirus 2 by RT PCR: NEGATIVE

## 2023-05-28 MED ORDER — LACTATED RINGERS IV SOLN
INTRAVENOUS | Status: AC
  Administered 2023-05-28: 150 mL/h via INTRAVENOUS

## 2023-05-28 MED ORDER — ACETAMINOPHEN 325 MG PO TABS
650.0000 mg | ORAL_TABLET | Freq: Once | ORAL | Status: AC
  Administered 2023-05-28: 650 mg via ORAL
  Filled 2023-05-28: qty 2

## 2023-05-28 MED ORDER — LACTATED RINGERS IV BOLUS
1000.0000 mL | Freq: Once | INTRAVENOUS | Status: AC
  Administered 2023-05-28: 1000 mL via INTRAVENOUS

## 2023-05-28 MED ORDER — MORPHINE SULFATE (PF) 4 MG/ML IV SOLN
4.0000 mg | Freq: Once | INTRAVENOUS | Status: AC
  Administered 2023-05-28: 4 mg via INTRAVENOUS
  Filled 2023-05-28: qty 1

## 2023-05-28 MED ORDER — SODIUM CHLORIDE 0.9 % IV SOLN
2.0000 g | Freq: Once | INTRAVENOUS | Status: AC
  Administered 2023-05-28: 2 g via INTRAVENOUS
  Filled 2023-05-28: qty 20

## 2023-05-28 MED ORDER — LACTATED RINGERS IV BOLUS (SEPSIS)
1000.0000 mL | Freq: Once | INTRAVENOUS | Status: AC
  Administered 2023-05-28: 1000 mL via INTRAVENOUS

## 2023-05-28 MED ORDER — SODIUM CHLORIDE 0.9 % IV SOLN: 250.0000 mL | INTRAVENOUS | Status: AC

## 2023-05-28 MED ORDER — SODIUM CHLORIDE 0.9% IV SOLUTION: Freq: Once | INTRAVENOUS | Status: DC

## 2023-05-28 MED ORDER — VANCOMYCIN HCL IN DEXTROSE 1-5 GM/200ML-% IV SOLN
1000.0000 mg | Freq: Once | INTRAVENOUS | Status: AC
  Administered 2023-05-28: 1000 mg via INTRAVENOUS
  Filled 2023-05-28: qty 200

## 2023-05-28 MED ORDER — NOREPINEPHRINE 4 MG/250ML-% IV SOLN
0.0000 ug/min | INTRAVENOUS | Status: DC
  Administered 2023-05-28: 2 ug/min via INTRAVENOUS
  Filled 2023-05-28: qty 250

## 2023-05-28 NOTE — ED Notes (Signed)
 Daughter Graylin Lea [phone] would like an update asap

## 2023-05-28 NOTE — ED Triage Notes (Signed)
 Patient BIB EMS from home. Patient had a recent vascular surgery, wound noted to the R lower leg & R groin.

## 2023-05-28 NOTE — ED Notes (Signed)
 Unsuccessful attempts for IV x4; IV team consulted STAT - still awaiting; Levo needed, paused Vanco since incompatible at this time; Vascular MD at bedside placing Central Line & A-Line.

## 2023-05-28 NOTE — ED Notes (Addendum)
 ED Provider at bedside; having difficulty gaining additional IV; MD aware; IV team consult.

## 2023-05-28 NOTE — ED Notes (Signed)
 ED Provider at bedside.

## 2023-05-28 NOTE — ED Notes (Signed)
 RT at the bedside setting up the A-line

## 2023-05-28 NOTE — ED Notes (Signed)
 This RN attempted 3 times to get blood cultures; unsuccessful, was only able to obtain 1 blue bottle. RN asked Phlebotomy for assistance.

## 2023-05-28 NOTE — ED Provider Notes (Addendum)
 [place] [facility] AT [facility] Provider Note   CSN: [id number] Arrival date & time: 05/28/23  2024     History  Chief Complaint  Patient presents with   Wound Infection   Code Sepsis    Katherine Archer is a 68 y.o. female.  Patient is a 68 year old female with a past medical history of PAD on Eliquis  s/p redo R femoral artery exposure w/ thombectomy, R popliteal artery exposure with bypass graft on 5/9 with Dr. Charlotte Cookey presenting to the ED with fevers and LE swelling.  Patient states that since Friday she has been having fevers at home.  She states that she has had intermittent nausea and vomiting with some diarrhea but states that the diarrhea is starting to resolve.  Denies any associated abdominal pain.  States that she is most having pain in her right groin and right leg.  She states that her wounds have also opened and has started to have purulent drainage from the wounds.  She states that she does have redness to her leg.  Patient also reports that she has had some hematuria, denies any dysuria.  The history is provided by the patient. A language interpreter was used (Spanish Nolberto Batty [phone]).       Home Medications Prior to Admission medications   Medication Sig Start Date End Date Taking? Authorizing Provider  acetaminophen  (TYLENOL ) 325 MG tablet Take 2 tablets (650 mg total) by mouth every 4 (four) hours as needed for mild pain (pain score 1-3). 05/15/23   Krishnan, Gokul, MD  alendronate (FOSAMAX) 70 MG tablet Take 70 mg by mouth once a week. 12/28/20   [provider]  apixaban  (ELIQUIS ) 5 MG TABS tablet Take 1 tablet (5 mg total) by mouth 2 (two) times daily. 05/15/23 08/13/23  Krishnan, Gokul, MD  ezetimibe  (ZETIA ) 10 MG tablet Take 10 mg by mouth daily. 11/18/21   [provider]  hydrALAZINE  (APRESOLINE ) 50 MG tablet Take 1 tablet (50 mg total) by mouth 3 (three) times daily. Patient taking differently: Take 25 mg by mouth 3  (three) times daily. 04/08/20 05/10/24  Tobb, Kardie, DO  oxyCODONE -acetaminophen  (PERCOCET/ROXICET) 5-325 MG tablet Take 1 tablet by mouth every 8 (eight) hours as needed for severe pain (pain score 7-10). 05/15/23   Krishnan, Gokul, MD  polyethylene glycol powder (GLYCOLAX /MIRALAX ) 17 GM/SCOOP powder Take 1 capful (17 g) by mouth daily as needed for mild constipation. 05/15/23   Krishnan, Gokul, MD  rosuvastatin  (CRESTOR ) 40 MG tablet Take 40 mg by mouth daily. 11/21/21   [provider]  TIADYLT  ER 180 MG 24 hr capsule Take 180 mg by mouth daily.    [provider]  valsartan (DIOVAN) 160 MG tablet Take 160 mg by mouth daily. 11/18/21   [provider]      Allergies    Amoxicillin and Penicillins    Review of Systems   Review of Systems  Physical Exam Updated Vital Signs BP (!) 73/44   Pulse 76   Temp 98.5 F (36.9 C) (Oral)   Resp 20   Ht 5\' 6"  (1.676 m)   Wt 56.7 kg   SpO2 97%   BMI 20.18 kg/m  Physical Exam Vitals and nursing note reviewed.  Constitutional:      General: She is not in acute distress.    Appearance: Normal appearance. She is ill-appearing.  HENT:     Head: Normocephalic.     Nose: Nose normal.     Mouth/Throat:  Mouth: Mucous membranes are dry.     Pharynx: Oropharynx is clear.  Eyes:     Extraocular Movements: Extraocular movements intact.     Conjunctiva/sclera: Conjunctivae normal.  Cardiovascular:     Rate and Rhythm: Normal rate and regular rhythm.     Heart sounds: Normal heart sounds.  Pulmonary:     Effort: Pulmonary effort is normal.     Breath sounds: Normal breath sounds.  Abdominal:     General: Abdomen is flat.     Palpations: Abdomen is soft.     Tenderness: There is no abdominal tenderness.  Musculoskeletal:        General: Normal range of motion.     Cervical back: Normal range of motion.     Right lower leg: Edema present.     Left lower leg: No edema.  Skin:    General: Skin is warm and dry.      Comments: R groin incision with wound dehiscence, small amount of serosangenous drainage R LL incision with wound dehiscence, purulent drainage, surrouding erythema, warmth and swelling to LE up to R medial thigh  Neurological:     General: No focal deficit present.     Mental Status: She is alert and oriented to person, place, and time.  Psychiatric:        Mood and Affect: Mood normal.        Behavior: Behavior normal.        ED Results / Procedures / Treatments   Labs (all labs ordered are listed, but only abnormal results are displayed) Labs Reviewed  CBC WITH DIFFERENTIAL/PLATELET - Abnormal; Notable for the following components:      Result Value   WBC 34.2 (*)    RBC 1.36 (*)    Hemoglobin 4.4 (*)    HCT 12.7 (*)    Neutro Abs 30.4 (*)    Abs Immature Granulocytes 1.19 (*)    All other components within normal limits  URINALYSIS, W/ REFLEX TO CULTURE (INFECTION SUSPECTED) - Abnormal; Notable for the following components:   Color, Urine AMBER (*)    APPearance HAZY (*)    Hgb urine dipstick MODERATE (*)    Protein, ur 100 (*)    Bacteria, UA RARE (*)    All other components within normal limits  COMPREHENSIVE METABOLIC PANEL WITH GFR - Abnormal; Notable for the following components:   Sodium 128 (*)    Potassium 3.3 (*)    Chloride 96 (*)    CO2 18 (*)    Calcium  7.3 (*)    Total Protein 4.8 (*)    Albumin  1.8 (*)    AST 209 (*)    ALT 124 (*)    Total Bilirubin 1.3 (*)    All other components within normal limits  RESP PANEL BY RT-PCR (RSV, FLU A&B, COVID)  RVPGX2  CULTURE, BLOOD (ROUTINE X 2)  CULTURE, BLOOD (ROUTINE X 2)  PROTIME-INR  FIBRINOGEN  I-STAT CG4 LACTIC ACID, ED  POC OCCULT BLOOD, ED  TYPE AND SCREEN  PREPARE RBC (CROSSMATCH)    EKG EKG Interpretation Date/Time:  Sunday May 28 2023 20:35:35 EDT Ventricular Rate:  101 PR Interval:  136 QRS Duration:  90 QT Interval:  304 QTC Calculation: 394 R Axis:   34  Text Interpretation: Sinus  tachycardia LAE, consider biatrial enlargement Abnormal R-wave progression, early transition LVH with secondary repolarization abnormality Confirmed by Celesta Coke ([id number]) on 05/28/2023 8:40:02 PM  Radiology DG Chest Port 1 View Result Date:  05/28/2023 CLINICAL DATA:  Sepsis, wound infection EXAM: PORTABLE CHEST 1 VIEW COMPARISON:  05/14/2023 FINDINGS: Single frontal view of the chest demonstrates a stable cardiac silhouette. No airspace disease, effusion, or pneumothorax. No acute bony abnormalities. IMPRESSION: 1. Stable chest, no acute process. Electronically Signed   By: Bobbye Burrow M.D.   On: 05/28/2023 21:42    Procedures .Critical Care  Performed by: Kingsley, Koby Pickup K, DO Authorized by: Nolberto Batty, DO   Critical care provider statement:    Critical care time (minutes):  50   Critical care was necessary to treat or prevent imminent or life-threatening deterioration of the following conditions:  Sepsis and shock   Critical care was time spent personally by me on the following activities:  Development of treatment plan with patient or surrogate, discussions with consultants, evaluation of patient's response to treatment, examination of patient, ordering and review of laboratory studies, ordering and review of radiographic studies, ordering and performing treatments and interventions, pulse oximetry, re-evaluation of patient's condition and review of old charts     Medications Ordered in ED Medications  lactated ringers  infusion (0 mLs Intravenous Paused 05/28/23 2324)  0.9 %  sodium chloride  infusion (Manually program via Guardrails IV Fluids) (0 mLs Intravenous Hold 05/28/23 2239)  0.9 %  sodium chloride  infusion (has no administration in time range)  norepinephrine (LEVOPHED) 4mg  in (0.016 mg/mL) premix infusion (6 mcg/min Intravenous Rate/Dose Change 05/28/23 2328)  lactated ringers  bolus 1,000 mL (0 mLs Intravenous Stopped 05/28/23 2245)  cefTRIAXone (ROCEPHIN)  2 g in sodium chloride  0.9 % 100 mL IVPB (0 g Intravenous Stopped 05/28/23 2229)  vancomycin  (VANCOCIN ) IVPB 1000 mg/200 mL premix (0 mg Intravenous Paused 05/28/23 2324)  morphine  (PF) 4 MG/ML injection 4 mg (4 mg Intravenous Given 05/28/23 2138)  acetaminophen  (TYLENOL ) tablet 650 mg (650 mg Oral Given 05/28/23 2137)  lactated ringers  bolus 1,000 mL (0 mLs Intravenous Stopped 05/28/23 2323)    ED Course/ Medical Decision Making/ A&P Clinical Course as of 05/28/23 2347  Sun May 28, 2023  2146 I spoke with Dr. Rosalva Comber with vascular, recommended CT angio with runoffs, hospitalist admission, NPO at midnight for likely OR washout tomorrow. [VK]  2236 I updated Dr Rosalva Comber with vascular with patient's status who is coming in to evaluate the patient for possible bedside drainage. She is now hypotensive to the 70's. Additional 1L IVF will be given to complete 30 cc/kg bolus and will be started on levophed for BP support. Hgb 4.4 from 10 post-op. Hemoccult performed. She is agreeable for transfusion and 2 u PRBC ordered.  [VK]  2251 Hemoccult negative. Will consult critical care for admission. [VK]  2339 Dr. Rosalva Comber at bedside placing A line and central line. I spoke with Dr. Charon Copper with critical care who will evaluate the patient. [VK]    Clinical Course User Index [VK] Kingsley, Abdi Husak K, DO                                 Medical Decision Making This patient presents to the ED with chief complaint(s) of fever, post-op infection with pertinent past medical history of PAD on Eliquis  s/p redo R femoral artery exposure w/ thombectomy, R popliteal artery exposure with bypass graft on 5/9  which further complicates the presenting complaint. The complaint involves an extensive differential diagnosis and also carries with it a high risk of complications and morbidity.    The differential diagnosis includes  sepsis, postop infection, cellulitis, other deep space infection, dehydration, electrolyte abnormality,  gastroenteritis  Additional history obtained: Additional history obtained from N/A Records reviewed previous admission documents  ED Course and Reassessment: On patient's arrival she is febrile, mildly tachycardic and tachypneic otherwise hemodynamically stable in no acute distress.  Does have significant erythema and warmth to right lower extremity with purulence to her right lower leg wound concerning for postop infection.  Patient will have labs including blood cultures and lactate and will be started on broad-spectrum antibiotics.  Will touch base with vascular surgery for any recommendations on imaging and patient will likely require admission for IV antibiotics.  Independent labs interpretation:  The following labs were independently interpreted: Hgb 4.4 from baseline 10, mild hyponatremia and transaminitis, significant leukocytosis  Independent visualization of imaging: - I independently visualized the following imaging with scope of interpretation limited to determining acute life threatening conditions related to emergency care: CXR, which revealed no acute disease  Consultation: - Consulted or discussed management/test interpretation w/ external professional: vascular, critical care  Consideration for admission or further workup: patient requires admission for septic shock and anemia Social Determinants of health: N/A    Amount and/or Complexity of Data Reviewed Labs: ordered. Radiology: ordered.  Risk OTC drugs. Prescription drug management. Decision regarding hospitalization.          Final Clinical Impression(s) / ED Diagnoses Final diagnoses:  Sepsis, due to unspecified organism, unspecified whether acute organ dysfunction present (HCC)  Sepsis following procedure, initial encounter (HCC)  Anemia, unspecified type    Rx / DC Orders ED Discharge Orders     None         Kingsley, Octaviano Mukai K, DO 05/28/23 2347    Kingsley, Cailie Bosshart K, DO 05/28/23  2347

## 2023-05-28 NOTE — Sepsis Progress Note (Signed)
 Elink following for sepsis protocol.

## 2023-05-29 ENCOUNTER — Inpatient Hospital Stay (HOSPITAL_COMMUNITY): Admitting: Anesthesiology

## 2023-05-29 ENCOUNTER — Inpatient Hospital Stay (HOSPITAL_COMMUNITY)

## 2023-05-29 ENCOUNTER — Encounter (HOSPITAL_COMMUNITY)
Admission: EM | Disposition: A | Discharge status: Rehabilitation Facility | Payer: Self-pay | Source: Home / Self Care | Attending: Family Medicine

## 2023-05-29 ENCOUNTER — Emergency Department (HOSPITAL_COMMUNITY)

## 2023-05-29 ENCOUNTER — Other Ambulatory Visit: Payer: Self-pay

## 2023-05-29 DIAGNOSIS — M79604 Pain in right leg: Secondary | ICD-10-CM | POA: Diagnosis not present

## 2023-05-29 DIAGNOSIS — Z515 Encounter for palliative care: Secondary | ICD-10-CM | POA: Diagnosis not present

## 2023-05-29 DIAGNOSIS — E871 Hypo-osmolality and hyponatremia: Secondary | ICD-10-CM | POA: Diagnosis not present

## 2023-05-29 DIAGNOSIS — I70201 Unspecified atherosclerosis of native arteries of extremities, right leg: Secondary | ICD-10-CM | POA: Diagnosis not present

## 2023-05-29 DIAGNOSIS — Z794 Long term (current) use of insulin: Secondary | ICD-10-CM | POA: Diagnosis not present

## 2023-05-29 DIAGNOSIS — I70221 Atherosclerosis of native arteries of extremities with rest pain, right leg: Secondary | ICD-10-CM | POA: Diagnosis not present

## 2023-05-29 DIAGNOSIS — I10 Essential (primary) hypertension: Secondary | ICD-10-CM | POA: Diagnosis not present

## 2023-05-29 DIAGNOSIS — T829XXA Unspecified complication of cardiac and vascular prosthetic device, implant and graft, initial encounter: Secondary | ICD-10-CM | POA: Diagnosis not present

## 2023-05-29 DIAGNOSIS — Z7982 Long term (current) use of aspirin: Secondary | ICD-10-CM | POA: Diagnosis not present

## 2023-05-29 DIAGNOSIS — R7881 Bacteremia: Secondary | ICD-10-CM | POA: Diagnosis not present

## 2023-05-29 DIAGNOSIS — Z79899 Other long term (current) drug therapy: Secondary | ICD-10-CM | POA: Diagnosis not present

## 2023-05-29 DIAGNOSIS — B950 Streptococcus, group A, as the cause of diseases classified elsewhere: Secondary | ICD-10-CM | POA: Diagnosis not present

## 2023-05-29 DIAGNOSIS — T827XXD Infection and inflammatory reaction due to other cardiac and vascular devices, implants and grafts, subsequent encounter: Secondary | ICD-10-CM | POA: Diagnosis not present

## 2023-05-29 DIAGNOSIS — K5900 Constipation, unspecified: Secondary | ICD-10-CM | POA: Diagnosis not present

## 2023-05-29 DIAGNOSIS — Z66 Do not resuscitate: Secondary | ICD-10-CM | POA: Diagnosis not present

## 2023-05-29 DIAGNOSIS — J811 Chronic pulmonary edema: Secondary | ICD-10-CM | POA: Diagnosis not present

## 2023-05-29 DIAGNOSIS — D649 Anemia, unspecified: Secondary | ICD-10-CM | POA: Diagnosis not present

## 2023-05-29 DIAGNOSIS — Z7189 Other specified counseling: Secondary | ICD-10-CM | POA: Diagnosis not present

## 2023-05-29 DIAGNOSIS — Z9889 Other specified postprocedural states: Secondary | ICD-10-CM

## 2023-05-29 DIAGNOSIS — I2510 Atherosclerotic heart disease of native coronary artery without angina pectoris: Secondary | ICD-10-CM

## 2023-05-29 DIAGNOSIS — I739 Peripheral vascular disease, unspecified: Secondary | ICD-10-CM | POA: Diagnosis not present

## 2023-05-29 DIAGNOSIS — E785 Hyperlipidemia, unspecified: Secondary | ICD-10-CM | POA: Diagnosis not present

## 2023-05-29 DIAGNOSIS — I959 Hypotension, unspecified: Secondary | ICD-10-CM

## 2023-05-29 DIAGNOSIS — T8149XA Infection following a procedure, other surgical site, initial encounter: Secondary | ICD-10-CM | POA: Diagnosis not present

## 2023-05-29 DIAGNOSIS — Y835 Amputation of limb(s) as the cause of abnormal reaction of the patient, or of later complication, without mention of misadventure at the time of the procedure: Secondary | ICD-10-CM | POA: Diagnosis not present

## 2023-05-29 DIAGNOSIS — Z603 Acculturation difficulty: Secondary | ICD-10-CM | POA: Diagnosis not present

## 2023-05-29 DIAGNOSIS — I96 Gangrene, not elsewhere classified: Secondary | ICD-10-CM | POA: Diagnosis not present

## 2023-05-29 DIAGNOSIS — Z452 Encounter for adjustment and management of vascular access device: Secondary | ICD-10-CM | POA: Diagnosis not present

## 2023-05-29 DIAGNOSIS — T81329A Deep disruption or dehiscence of operation wound, unspecified, initial encounter: Secondary | ICD-10-CM

## 2023-05-29 DIAGNOSIS — E782 Mixed hyperlipidemia: Secondary | ICD-10-CM | POA: Diagnosis not present

## 2023-05-29 DIAGNOSIS — B9562 Methicillin resistant Staphylococcus aureus infection as the cause of diseases classified elsewhere: Secondary | ICD-10-CM | POA: Diagnosis not present

## 2023-05-29 DIAGNOSIS — I70261 Atherosclerosis of native arteries of extremities with gangrene, right leg: Secondary | ICD-10-CM | POA: Diagnosis not present

## 2023-05-29 DIAGNOSIS — T8753 Necrosis of amputation stump, right lower extremity: Secondary | ICD-10-CM | POA: Diagnosis not present

## 2023-05-29 DIAGNOSIS — Z87891 Personal history of nicotine dependence: Secondary | ICD-10-CM | POA: Diagnosis not present

## 2023-05-29 DIAGNOSIS — T827XXA Infection and inflammatory reaction due to other cardiac and vascular devices, implants and grafts, initial encounter: Secondary | ICD-10-CM

## 2023-05-29 DIAGNOSIS — Z7901 Long term (current) use of anticoagulants: Secondary | ICD-10-CM | POA: Diagnosis not present

## 2023-05-29 DIAGNOSIS — E1159 Type 2 diabetes mellitus with other circulatory complications: Secondary | ICD-10-CM | POA: Diagnosis not present

## 2023-05-29 DIAGNOSIS — T8140XA Infection following a procedure, unspecified, initial encounter: Secondary | ICD-10-CM | POA: Diagnosis not present

## 2023-05-29 DIAGNOSIS — M7989 Other specified soft tissue disorders: Secondary | ICD-10-CM | POA: Diagnosis not present

## 2023-05-29 DIAGNOSIS — T8789 Other complications of amputation stump: Secondary | ICD-10-CM | POA: Diagnosis not present

## 2023-05-29 DIAGNOSIS — R739 Hyperglycemia, unspecified: Secondary | ICD-10-CM | POA: Diagnosis not present

## 2023-05-29 DIAGNOSIS — Z8673 Personal history of transient ischemic attack (TIA), and cerebral infarction without residual deficits: Secondary | ICD-10-CM | POA: Diagnosis not present

## 2023-05-29 DIAGNOSIS — A419 Sepsis, unspecified organism: Secondary | ICD-10-CM

## 2023-05-29 DIAGNOSIS — R6521 Severe sepsis with septic shock: Secondary | ICD-10-CM | POA: Diagnosis not present

## 2023-05-29 DIAGNOSIS — S31109D Unspecified open wound of abdominal wall, unspecified quadrant without penetration into peritoneal cavity, subsequent encounter: Secondary | ICD-10-CM | POA: Diagnosis not present

## 2023-05-29 DIAGNOSIS — K5730 Diverticulosis of large intestine without perforation or abscess without bleeding: Secondary | ICD-10-CM | POA: Diagnosis not present

## 2023-05-29 DIAGNOSIS — D62 Acute posthemorrhagic anemia: Secondary | ICD-10-CM | POA: Diagnosis not present

## 2023-05-29 DIAGNOSIS — I998 Other disorder of circulatory system: Secondary | ICD-10-CM | POA: Diagnosis not present

## 2023-05-29 DIAGNOSIS — G546 Phantom limb syndrome with pain: Secondary | ICD-10-CM | POA: Diagnosis not present

## 2023-05-29 DIAGNOSIS — I639 Cerebral infarction, unspecified: Secondary | ICD-10-CM | POA: Diagnosis not present

## 2023-05-29 DIAGNOSIS — Z4781 Encounter for orthopedic aftercare following surgical amputation: Secondary | ICD-10-CM | POA: Diagnosis not present

## 2023-05-29 DIAGNOSIS — A400 Sepsis due to streptococcus, group A: Secondary | ICD-10-CM | POA: Diagnosis not present

## 2023-05-29 DIAGNOSIS — D72829 Elevated white blood cell count, unspecified: Secondary | ICD-10-CM

## 2023-05-29 DIAGNOSIS — Z7983 Long term (current) use of bisphosphonates: Secondary | ICD-10-CM | POA: Diagnosis not present

## 2023-05-29 DIAGNOSIS — N3289 Other specified disorders of bladder: Secondary | ICD-10-CM | POA: Diagnosis not present

## 2023-05-29 DIAGNOSIS — L089 Local infection of the skin and subcutaneous tissue, unspecified: Secondary | ICD-10-CM | POA: Diagnosis not present

## 2023-05-29 DIAGNOSIS — T8781 Dehiscence of amputation stump: Secondary | ICD-10-CM | POA: Diagnosis not present

## 2023-05-29 DIAGNOSIS — Y832 Surgical operation with anastomosis, bypass or graft as the cause of abnormal reaction of the patient, or of later complication, without mention of misadventure at the time of the procedure: Secondary | ICD-10-CM | POA: Diagnosis not present

## 2023-05-29 DIAGNOSIS — I700 Atherosclerosis of aorta: Secondary | ICD-10-CM | POA: Diagnosis not present

## 2023-05-29 DIAGNOSIS — Z89611 Acquired absence of right leg above knee: Secondary | ICD-10-CM | POA: Diagnosis not present

## 2023-05-29 DIAGNOSIS — Z89621 Acquired absence of right hip joint: Secondary | ICD-10-CM | POA: Diagnosis not present

## 2023-05-29 DIAGNOSIS — S78111A Complete traumatic amputation at level between right hip and knee, initial encounter: Secondary | ICD-10-CM | POA: Diagnosis not present

## 2023-05-29 DIAGNOSIS — E43 Unspecified severe protein-calorie malnutrition: Secondary | ICD-10-CM | POA: Diagnosis not present

## 2023-05-29 HISTORY — PX: AVGG REMOVAL: SHX5153

## 2023-05-29 HISTORY — DX: Sepsis, unspecified organism: A41.9

## 2023-05-29 LAB — CBC
HCT: 29.3 % — ABNORMAL LOW (ref 36.0–46.0)
Hemoglobin: 10.2 g/dL — ABNORMAL LOW (ref 12.0–15.0)
MCH: 30.1 pg (ref 26.0–34.0)
MCHC: 34.8 g/dL (ref 30.0–36.0)
MCV: 86.4 fL (ref 80.0–100.0)
Platelets: 172 10*3/uL (ref 150–400)
RBC: 3.39 MIL/uL — ABNORMAL LOW (ref 3.87–5.11)
RDW: 13.9 % (ref 11.5–15.5)
WBC: 19.9 10*3/uL — ABNORMAL HIGH (ref 4.0–10.5)
nRBC: 0 % (ref 0.0–0.2)

## 2023-05-29 LAB — BASIC METABOLIC PANEL WITH GFR
Anion gap: 8 (ref 5–15)
Anion gap: 8 (ref 5–15)
BUN: 9 mg/dL (ref 8–23)
BUN: 9 mg/dL (ref 8–23)
CO2: 23 mmol/L (ref 22–32)
CO2: 23 mmol/L (ref 22–32)
Calcium: 7.2 mg/dL — ABNORMAL LOW (ref 8.9–10.3)
Calcium: 7.3 mg/dL — ABNORMAL LOW (ref 8.9–10.3)
Chloride: 104 mmol/L (ref 98–111)
Chloride: 106 mmol/L (ref 98–111)
Creatinine, Ser: 0.8 mg/dL (ref 0.44–1.00)
Creatinine, Ser: 0.8 mg/dL (ref 0.44–1.00)
GFR, Estimated: >60 mL/min (ref >60)
GFR, Estimated: >60 mL/min (ref >60)
Glucose, Bld: 101 mg/dL — ABNORMAL HIGH (ref 70–99)
Glucose, Bld: 123 mg/dL — ABNORMAL HIGH (ref 70–99)
Potassium: 3.5 mmol/L (ref 3.5–5.1)
Potassium: 3.7 mmol/L (ref 3.5–5.1)
Sodium: 135 mmol/L (ref 135–145)
Sodium: 137 mmol/L (ref 135–145)

## 2023-05-29 LAB — POCT I-STAT 7, (LYTES, BLD GAS, ICA,H+H)
Acid-base deficit: 2 mmol/L (ref 0.0–2.0)
Bicarbonate: 22.3 mmol/L (ref 20.0–28.0)
Calcium, Ion: 1.07 mmol/L — ABNORMAL LOW (ref 1.15–1.40)
HCT: 29 % — ABNORMAL LOW (ref 36.0–46.0)
Hemoglobin: 9.9 g/dL — ABNORMAL LOW (ref 12.0–15.0)
O2 Saturation: 100 %
Potassium: 3.7 mmol/L (ref 3.5–5.1)
Sodium: 137 mmol/L (ref 135–145)
TCO2: 23 mmol/L (ref 22–32)
pCO2 arterial: 36.7 mmHg (ref 32–48)
pH, Arterial: 7.391 (ref 7.35–7.45)
pO2, Arterial: 186 mmHg — ABNORMAL HIGH (ref 83–108)

## 2023-05-29 LAB — GLUCOSE, CAPILLARY
Glucose-Capillary: 112 mg/dL — ABNORMAL HIGH (ref 70–99)
Glucose-Capillary: 121 mg/dL — ABNORMAL HIGH (ref 70–99)
Glucose-Capillary: 143 mg/dL — ABNORMAL HIGH (ref 70–99)

## 2023-05-29 LAB — BLOOD CULTURE ID PANEL (REFLEXED) - BCID2

## 2023-05-29 LAB — PROTIME-INR
INR: 1.5 — ABNORMAL HIGH (ref 0.8–1.2)
Prothrombin Time: 18.1 s — ABNORMAL HIGH (ref 11.4–15.2)

## 2023-05-29 LAB — MRSA NEXT GEN BY PCR, NASAL: MRSA by PCR Next Gen: NOT DETECTED

## 2023-05-29 LAB — MAGNESIUM: Magnesium: 1.8 mg/dL (ref 1.7–2.4)

## 2023-05-29 LAB — SURGICAL PCR SCREEN
MRSA, PCR: NEGATIVE
Staphylococcus aureus: NEGATIVE

## 2023-05-29 LAB — PREPARE RBC (CROSSMATCH)

## 2023-05-29 LAB — FIBRINOGEN: Fibrinogen: 581 mg/dL — ABNORMAL HIGH (ref 210–475)

## 2023-05-29 LAB — POCT ACTIVATED CLOTTING TIME: Activated Clotting Time: 210 s

## 2023-05-29 SURGERY — REMOVAL OF ARTERIOVENOUS GORETEX GRAFT (AVGG)
Anesthesia: General | Laterality: Right

## 2023-05-29 MED ORDER — OXYCODONE-ACETAMINOPHEN 5-325 MG PO TABS
1.0000 | ORAL_TABLET | ORAL | Status: DC | PRN
  Administered 2023-05-29: 1 via ORAL
  Filled 2023-05-29: qty 1

## 2023-05-29 MED ORDER — PROPOFOL 10 MG/ML IV BOLUS
INTRAVENOUS | Status: AC
Start: 2023-05-29 — End: ?
  Filled 2023-05-29: qty 20

## 2023-05-29 MED ORDER — MUPIROCIN 2 % EX OINT
1.0000 | TOPICAL_OINTMENT | Freq: Two times a day (BID) | CUTANEOUS | Status: AC
Start: 2023-05-29 — End: 2023-06-02
  Administered 2023-05-29 – 2023-06-02 (×7): 1 via NASAL
  Filled 2023-05-29 (×3): qty 22

## 2023-05-29 MED ORDER — PIPERACILLIN-TAZOBACTAM 3.375 G IVPB
3.3750 g | Freq: Three times a day (TID) | INTRAVENOUS | Status: DC
  Administered 2023-05-29 – 2023-05-30 (×4): 3.375 g via INTRAVENOUS
  Filled 2023-05-29 (×4): qty 50

## 2023-05-29 MED ORDER — SODIUM CHLORIDE 0.9 % IV SOLN
INTRAVENOUS | Status: DC | PRN
Start: 2023-05-29 — End: 2023-05-29

## 2023-05-29 MED ORDER — HEPARIN 6000 UNIT IRRIGATION SOLUTION
Status: DC | PRN
  Administered 2023-05-29: 1

## 2023-05-29 MED ORDER — MORPHINE SULFATE (PF) 4 MG/ML IV SOLN: 4.0000 mg | INTRAVENOUS | Status: DC | PRN

## 2023-05-29 MED ORDER — POLYETHYLENE GLYCOL 3350 17 G PO PACK
17.0000 g | PACK | Freq: Every day | ORAL | Status: DC | PRN
  Administered 2023-06-02 – 2023-06-06 (×2): 17 g via ORAL
  Filled 2023-05-29 (×2): qty 1

## 2023-05-29 MED ORDER — HEPARIN SODIUM (PORCINE) 1000 UNIT/ML IJ SOLN
INTRAMUSCULAR | Status: DC | PRN
  Administered 2023-05-29: 5000 [IU] via INTRAVENOUS

## 2023-05-29 MED ORDER — INSULIN ASPART 100 UNIT/ML IJ SOLN
0.0000 [IU] | Freq: Three times a day (TID) | INTRAMUSCULAR | Status: DC
  Administered 2023-05-29 – 2023-06-06 (×5): 1 [IU] via SUBCUTANEOUS

## 2023-05-29 MED ORDER — OXYCODONE HCL 5 MG/5ML PO SOLN: 5.0000 mg | Freq: Once | ORAL | Status: DC | PRN

## 2023-05-29 MED ORDER — HEPARIN 6000 UNIT IRRIGATION SOLUTION
Status: AC
  Filled 2023-05-29: qty 500

## 2023-05-29 MED ORDER — SODIUM CHLORIDE 0.9% FLUSH
10.0000 mL | Freq: Two times a day (BID) | INTRAVENOUS | Status: DC
  Administered 2023-05-29 – 2023-06-07 (×17): 10 mL

## 2023-05-29 MED ORDER — LACTATED RINGERS IV SOLN: INTRAVENOUS | Status: DC

## 2023-05-29 MED ORDER — VANCOMYCIN HCL 500 MG/100ML IV SOLN
500.0000 mg | Freq: Two times a day (BID) | INTRAVENOUS | Status: DC
  Administered 2023-05-29 – 2023-05-30 (×3): 500 mg via INTRAVENOUS
  Filled 2023-05-29 (×3): qty 100

## 2023-05-29 MED ORDER — DEXMEDETOMIDINE HCL IN NACL 80 MCG/20ML IV SOLN
INTRAVENOUS | Status: AC
  Filled 2023-05-29: qty 20

## 2023-05-29 MED ORDER — SUGAMMADEX SODIUM 200 MG/2ML IV SOLN
INTRAVENOUS | Status: DC | PRN
  Administered 2023-05-29: 200 mg via INTRAVENOUS

## 2023-05-29 MED ORDER — SODIUM BICARBONATE 8.4 % IV SOLN
100.0000 meq | Freq: Once | INTRAVENOUS | Status: AC
  Administered 2023-05-29: 100 meq via INTRAVENOUS
  Filled 2023-05-29: qty 50

## 2023-05-29 MED ORDER — DOCUSATE SODIUM 100 MG PO CAPS
100.0000 mg | ORAL_CAPSULE | Freq: Two times a day (BID) | ORAL | Status: DC | PRN
  Administered 2023-06-01 – 2023-06-06 (×4): 100 mg via ORAL
  Filled 2023-05-29 (×4): qty 1

## 2023-05-29 MED ORDER — POTASSIUM CHLORIDE 10 MEQ/100ML IV SOLN
10.0000 meq | INTRAVENOUS | Status: AC
Start: 2023-05-29 — End: 2023-05-29
  Administered 2023-05-29 (×2): 10 meq via INTRAVENOUS
  Filled 2023-05-29: qty 100

## 2023-05-29 MED ORDER — ONDANSETRON HCL 4 MG/2ML IJ SOLN
INTRAMUSCULAR | Status: DC | PRN
  Administered 2023-05-29: 4 mg via INTRAVENOUS

## 2023-05-29 MED ORDER — VANCOMYCIN HCL 1000 MG IV SOLR
INTRAVENOUS | Status: DC | PRN
  Administered 2023-05-29: 1000 mg

## 2023-05-29 MED ORDER — OXYCODONE HCL 5 MG PO TABS
5.0000 mg | ORAL_TABLET | Freq: Four times a day (QID) | ORAL | Status: DC | PRN
  Administered 2023-05-29: 10 mg via ORAL
  Administered 2023-05-29 – 2023-05-31 (×3): 5 mg via ORAL
  Administered 2023-05-31 – 2023-06-03 (×9): 10 mg via ORAL
  Administered 2023-06-03: 5 mg via ORAL
  Administered 2023-06-04 – 2023-06-07 (×12): 10 mg via ORAL
  Filled 2023-05-29 (×4): qty 2
  Filled 2023-05-29: qty 1
  Filled 2023-05-29 (×7): qty 2
  Filled 2023-05-29: qty 1
  Filled 2023-05-29 (×2): qty 2
  Filled 2023-05-29 (×2): qty 1
  Filled 2023-05-29: qty 2
  Filled 2023-05-29: qty 1
  Filled 2023-05-29: qty 2
  Filled 2023-05-29: qty 1
  Filled 2023-05-29 (×6): qty 2

## 2023-05-29 MED ORDER — PIPERACILLIN-TAZOBACTAM 3.375 G IVPB 30 MIN: 3.3750 g | Freq: Once | INTRAVENOUS | Status: DC

## 2023-05-29 MED ORDER — HEPARIN SODIUM (PORCINE) 5000 UNIT/ML IJ SOLN
5000.0000 [IU] | Freq: Three times a day (TID) | INTRAMUSCULAR | Status: DC
  Administered 2023-05-29 – 2023-06-07 (×27): 5000 [IU] via SUBCUTANEOUS
  Filled 2023-05-29 (×25): qty 1

## 2023-05-29 MED ORDER — VANCOMYCIN HCL 1 G IV SOLR
Freq: Once | INTRAVENOUS | Status: DC
  Filled 2023-05-29: qty 20

## 2023-05-29 MED ORDER — ONDANSETRON HCL 4 MG/2ML IJ SOLN
4.0000 mg | Freq: Four times a day (QID) | INTRAMUSCULAR | Status: DC | PRN
  Administered 2023-05-30 – 2023-06-01 (×2): 4 mg via INTRAVENOUS
  Filled 2023-05-29 (×2): qty 2

## 2023-05-29 MED ORDER — FENTANYL CITRATE (PF) 100 MCG/2ML IJ SOLN: 25.0000 ug | INTRAMUSCULAR | Status: DC | PRN

## 2023-05-29 MED ORDER — FENTANYL CITRATE (PF) 250 MCG/5ML IJ SOLN
INTRAMUSCULAR | Status: DC | PRN
  Administered 2023-05-29: 50 ug via INTRAVENOUS
  Administered 2023-05-29: 100 ug via INTRAVENOUS
  Administered 2023-05-29 (×2): 50 ug via INTRAVENOUS

## 2023-05-29 MED ORDER — ROCURONIUM BROMIDE 10 MG/ML (PF) SYRINGE
PREFILLED_SYRINGE | INTRAVENOUS | Status: DC | PRN
Start: 2023-05-29 — End: 2023-05-29
  Administered 2023-05-29: 60 mg via INTRAVENOUS
  Administered 2023-05-29: 10 mg via INTRAVENOUS

## 2023-05-29 MED ORDER — SODIUM CHLORIDE 0.9 % IV SOLN: 10.0000 mL/h | Freq: Once | INTRAVENOUS | Status: DC

## 2023-05-29 MED ORDER — MIDAZOLAM HCL 2 MG/2ML IJ SOLN
INTRAMUSCULAR | Status: DC | PRN
  Administered 2023-05-29: 2 mg via INTRAVENOUS

## 2023-05-29 MED ORDER — HYDROMORPHONE HCL 1 MG/ML IJ SOLN
INTRAMUSCULAR | Status: DC | PRN
  Administered 2023-05-29: .5 mg via INTRAVENOUS

## 2023-05-29 MED ORDER — PROPOFOL 10 MG/ML IV BOLUS
INTRAVENOUS | Status: DC | PRN
  Administered 2023-05-29: 90 mg via INTRAVENOUS

## 2023-05-29 MED ORDER — ONDANSETRON HCL 4 MG/2ML IJ SOLN
INTRAMUSCULAR | Status: AC
  Filled 2023-05-29: qty 2

## 2023-05-29 MED ORDER — ACETAMINOPHEN 10 MG/ML IV SOLN
1000.0000 mg | Freq: Once | INTRAVENOUS | Status: DC | PRN
Start: 2023-05-29 — End: 2023-05-29

## 2023-05-29 MED ORDER — DEXAMETHASONE SODIUM PHOSPHATE 10 MG/ML IJ SOLN
INTRAMUSCULAR | Status: AC
  Filled 2023-05-29: qty 1

## 2023-05-29 MED ORDER — SODIUM CHLORIDE 0.9% FLUSH: 10.0000 mL | INTRAVENOUS | Status: DC | PRN

## 2023-05-29 MED ORDER — HYDRALAZINE HCL 20 MG/ML IJ SOLN
10.0000 mg | INTRAMUSCULAR | Status: DC | PRN
  Administered 2023-05-29 – 2023-06-01 (×2): 10 mg via INTRAVENOUS
  Administered 2023-06-02 (×3): 20 mg via INTRAVENOUS
  Filled 2023-05-29 (×6): qty 1

## 2023-05-29 MED ORDER — HYDROMORPHONE HCL 1 MG/ML IJ SOLN
INTRAMUSCULAR | Status: AC
  Filled 2023-05-29: qty 0.5

## 2023-05-29 MED ORDER — ROCURONIUM BROMIDE 10 MG/ML (PF) SYRINGE
PREFILLED_SYRINGE | INTRAVENOUS | Status: AC
  Filled 2023-05-29: qty 10

## 2023-05-29 MED ORDER — OXYCODONE HCL 5 MG PO TABS
5.0000 mg | ORAL_TABLET | Freq: Once | ORAL | Status: DC | PRN
Start: 2023-05-29 — End: 2023-05-29

## 2023-05-29 MED ORDER — HYDROMORPHONE HCL 1 MG/ML IJ SOLN
1.0000 mg | INTRAMUSCULAR | Status: DC | PRN
  Administered 2023-05-30 – 2023-06-06 (×15): 1 mg via INTRAVENOUS
  Filled 2023-05-29 (×16): qty 1

## 2023-05-29 MED ORDER — FENTANYL CITRATE (PF) 250 MCG/5ML IJ SOLN
INTRAMUSCULAR | Status: AC
  Filled 2023-05-29: qty 5

## 2023-05-29 MED ORDER — LIDOCAINE 2% (20 MG/ML) 5 ML SYRINGE
INTRAMUSCULAR | Status: AC
  Filled 2023-05-29: qty 5

## 2023-05-29 MED ORDER — HEPARIN SODIUM (PORCINE) 1000 UNIT/ML IJ SOLN
INTRAMUSCULAR | Status: AC
  Filled 2023-05-29: qty 10

## 2023-05-29 MED ORDER — GENTAMICIN SULFATE 40 MG/ML IJ SOLN
INTRAMUSCULAR | Status: DC | PRN
  Administered 2023-05-29: 240 mg

## 2023-05-29 MED ORDER — NOREPINEPHRINE 4 MG/250ML-% IV SOLN
0.0000 ug/min | INTRAVENOUS | Status: DC
  Filled 2023-05-29: qty 250

## 2023-05-29 MED ORDER — DEXAMETHASONE SODIUM PHOSPHATE 10 MG/ML IJ SOLN
INTRAMUSCULAR | Status: DC | PRN
  Administered 2023-05-29: 5 mg via INTRAVENOUS

## 2023-05-29 MED ORDER — 0.9 % SODIUM CHLORIDE (POUR BTL) OPTIME
TOPICAL | Status: DC | PRN
  Administered 2023-05-29: 1000 mL

## 2023-05-29 MED ORDER — GENTAMICIN SULFATE 40 MG/ML IJ SOLN
80.0000 mg | Freq: Once | INTRAMUSCULAR | Status: DC
  Filled 2023-05-29 (×2): qty 2

## 2023-05-29 MED ORDER — SODIUM CHLORIDE 0.9 % IV SOLN: INTRAVENOUS | Status: DC | PRN

## 2023-05-29 MED ORDER — ACETAMINOPHEN 10 MG/ML IV SOLN
1000.0000 mg | Freq: Four times a day (QID) | INTRAVENOUS | Status: AC
  Administered 2023-05-29 – 2023-05-30 (×4): 1000 mg via INTRAVENOUS
  Filled 2023-05-29 (×3): qty 100

## 2023-05-29 MED ORDER — IOHEXOL 350 MG/ML SOLN
100.0000 mL | Freq: Once | INTRAVENOUS | Status: AC | PRN
  Administered 2023-05-29: 100 mL via INTRAVENOUS

## 2023-05-29 MED ORDER — IRBESARTAN 150 MG PO TABS
75.0000 mg | ORAL_TABLET | Freq: Every day | ORAL | Status: DC
  Administered 2023-05-29 – 2023-06-07 (×9): 75 mg via ORAL
  Filled 2023-05-29 (×10): qty 1

## 2023-05-29 MED ORDER — MIDAZOLAM HCL 2 MG/2ML IJ SOLN
INTRAMUSCULAR | Status: AC
  Filled 2023-05-29: qty 2

## 2023-05-29 MED ORDER — LIDOCAINE 2% (20 MG/ML) 5 ML SYRINGE
INTRAMUSCULAR | Status: DC | PRN
  Administered 2023-05-29: 60 mg via INTRAVENOUS

## 2023-05-29 MED ORDER — DEXMEDETOMIDINE HCL IN NACL 80 MCG/20ML IV SOLN
INTRAVENOUS | Status: DC | PRN
  Administered 2023-05-29 (×2): 8 ug via INTRAVENOUS

## 2023-05-29 MED ORDER — CHLORHEXIDINE GLUCONATE CLOTH 2 % EX PADS
6.0000 | MEDICATED_PAD | Freq: Every day | CUTANEOUS | Status: DC
  Administered 2023-05-29 – 2023-06-07 (×9): 6 via TOPICAL

## 2023-05-29 MED ORDER — ACETAMINOPHEN 500 MG PO TABS
500.0000 mg | ORAL_TABLET | ORAL | Status: DC | PRN
Start: 2023-05-29 — End: 2023-05-29
  Filled 2023-05-29: qty 1

## 2023-05-29 MED ORDER — VANCOMYCIN HCL 1 G IV SOLR
INTRAVENOUS | Status: DC | PRN
  Administered 2023-05-29: 1000 mL

## 2023-05-29 SURGICAL SUPPLY — 53 items
ARMBAND PINK RESTRICT EXTREMIT (MISCELLANEOUS) ×1 IMPLANT
BAG COUNTER SPONGE SURGICOUNT (BAG) ×1 IMPLANT
CANISTER SUCTION 3000ML PPV (SUCTIONS) ×1 IMPLANT
CANISTER WOUND CARE 500ML ATS (WOUND CARE) IMPLANT
CLIP APPLIE 11 MED OPEN (CLIP) IMPLANT
CLIP APPLIE 9.375 SM OPEN (CLIP) IMPLANT
CLIP TI MEDIUM 24 (CLIP) ×1 IMPLANT
CLIP TI MEDIUM 6 (CLIP) ×2 IMPLANT
CLIP TI WIDE RED SMALL 24 (CLIP) ×1 IMPLANT
CLIP TI WIDE RED SMALL 6 (CLIP) ×1 IMPLANT
DERMABOND ADVANCED .7 DNX12 (GAUZE/BANDAGES/DRESSINGS) ×1 IMPLANT
DRAIN CHANNEL 15F RND FF W/TCR (WOUND CARE) IMPLANT
DRAIN CHANNEL 19F RND (DRAIN) IMPLANT
DRESSING VERAFLO CLEANS CC MED (GAUZE/BANDAGES/DRESSINGS) IMPLANT
DRSG COVADERM 4X6 (GAUZE/BANDAGES/DRESSINGS) IMPLANT
DRSG COVADERM 4X8 (GAUZE/BANDAGES/DRESSINGS) IMPLANT
DRSG VERSA FOAM LRG 10X15 (GAUZE/BANDAGES/DRESSINGS) IMPLANT
ELECTRODE REM PT RTRN 9FT ADLT (ELECTROSURGICAL) ×1 IMPLANT
EVACUATOR SILICONE 100CC (DRAIN) IMPLANT
GLOVE BIO SURGEON STRL SZ8 (GLOVE) IMPLANT
GLOVE BIOGEL PI IND STRL 8 (GLOVE) ×1 IMPLANT
GLOVE INDICATOR 7.5 STRL GRN (GLOVE) IMPLANT
GLOVE SURG SS PI 7.5 STRL IVOR (GLOVE) IMPLANT
GOWN STRL REUS W/ TWL LRG LVL3 (GOWN DISPOSABLE) ×2 IMPLANT
GOWN STRL REUS W/TWL 2XL LVL3 (GOWN DISPOSABLE) ×2 IMPLANT
KIT BASIN OR (CUSTOM PROCEDURE TRAY) ×1 IMPLANT
KIT STIMULAN RAPID CURE 10CC (Orthopedic Implant) IMPLANT
KIT TURNOVER KIT B (KITS) ×1 IMPLANT
NDL HYPO 25GX1X1/2 BEV (NEEDLE) ×1 IMPLANT
NEEDLE HYPO 25GX1X1/2 BEV (NEEDLE) ×1 IMPLANT
NS IRRIG 1000ML POUR BTL (IV SOLUTION) ×1 IMPLANT
PACK CV ACCESS (CUSTOM PROCEDURE TRAY) ×1 IMPLANT
PAD ARMBOARD POSITIONER FOAM (MISCELLANEOUS) ×2 IMPLANT
PAD NEG PRESSURE SENSATRAC (MISCELLANEOUS) IMPLANT
SPIKE FLUID TRANSFER (MISCELLANEOUS) IMPLANT
STAPLER SKIN PROX 35W (STAPLE) IMPLANT
SUT MNCRL AB 4-0 PS2 18 (SUTURE) IMPLANT
SUT PROLENE 3 0 SH1 36 (SUTURE) IMPLANT
SUT PROLENE 4-0 RB1 .5 CRCL 36 (SUTURE) IMPLANT
SUT PROLENE 5 0 C 1 24 (SUTURE) IMPLANT
SUT PROLENE 6 0 BV (SUTURE) ×1 IMPLANT
SUT SILK 0 TIES 10X30 (SUTURE) IMPLANT
SUT SILK 2 0 PERMA HAND 18 BK (SUTURE) IMPLANT
SUT SILK 2 0 SH (SUTURE) IMPLANT
SUT VIC AB 2-0 CT1 TAPERPNT 27 (SUTURE) IMPLANT
SUT VIC AB 3-0 SH 27X BRD (SUTURE) ×1 IMPLANT
SWAB COLLECTION DEVICE MRSA (MISCELLANEOUS) IMPLANT
SWAB CULTURE ESWAB REG 1ML (MISCELLANEOUS) IMPLANT
SYR 10ML LL (SYRINGE) IMPLANT
SYR BULB IRRIG 60ML STRL (SYRINGE) IMPLANT
TOWEL GREEN STERILE (TOWEL DISPOSABLE) ×1 IMPLANT
UNDERPAD 30X36 HEAVY ABSORB (UNDERPADS AND DIAPERS) ×1 IMPLANT
WATER STERILE IRR 1000ML POUR (IV SOLUTION) ×1 IMPLANT

## 2023-05-29 NOTE — Progress Notes (Signed)
  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 She presented in extremis with infection ongoing for the last several days.  In the ED, I placed an arterial line as well as central line, she was taken to the unit for resuscitation.  White count improved with the use of antibiotics.  Plan today will be graft excision.  She is aware that she would lose the right leg, however we will not move forward with amputation today.  There is a high likelihood that the entirety of the axillofemoral bypass graft will need to be resected with new bypass graft placed on the left.  Katherine Archer is aware that this will take multiple surgeries and she will have a prolonged hospital course.  After discussing risks and benefits with both her and her family, she elected to proceed with graft excision today, VAC placement with plans to return to the OR later this week for further surgery.   Katherine Part MD [facility] [phone] 05/29/2023  7:30 AM

## 2023-05-29 NOTE — Anesthesia Postprocedure Evaluation (Signed)
 Anesthesia Post Note  Patient: Katherine Archer  Procedure(s) Performed: REMOVAL BYPASS GRAFT RIGHT LEG (Right)     Patient location during evaluation: Nursing Unit Anesthesia Type: General Level of consciousness: awake and patient cooperative Pain management: pain level controlled Vital Signs Assessment: post-procedure vital signs reviewed and stable Respiratory status: spontaneous breathing, nonlabored ventilation and respiratory function stable Cardiovascular status: blood pressure returned to baseline and stable Postop Assessment: no apparent nausea or vomiting Anesthetic complications: no   No notable events documented.                  Gussie Murton

## 2023-05-29 NOTE — Consult Note (Signed)
 Consultation Note Date: 05/29/2023   Patient Name: Katherine Archer  DOB: [birth year]  MRN: [id number]  Age / Sex: 68 y.o., female  PCP: Barbar Levine, MD Referring Physician: Josiah Nigh, MD  Reason for Consultation: Establishing goals of care  HPI/Patient Profile: 68 y.o. female  with past medical history of PAD, critical limb ischemia s/p R femoral-popliteal artery bypass, HTN, HLD admitted on 05/28/2023 with RLE pain, swelling, purulent drainage and fevers with septic shock due to infected R fem-pop bypass. S/P graft excision 5/26. Anticipate further surgical interventions will be needed for potential further bypass graft resection and anticipation of need for amputation. Vascular surgery has concerns for AKA healing if pursued and recommend further discussions for goals of care conversations.   Clinical Assessment and Goals of Care: Consult received and chart review completed. I met today at Katherine Archer bedside along with daughter Katherine Archer and Katherine Archer as well as granddaughter. They report that there is one other daughter not present. Katherine Archer had a son who unfortunately died in 2019/06/29. Katherine Archer has a sister, Katherine Archer, who lives in [place] and family report that there is no documentation but Katherine Archer would have the final say - they also report that she would be the first emergency contact. Katherine Archer is still sleepy from procedure and not able to fully participate in conversation today. We did plan to have a conference call tomorrow 1pm with hopes she will be more able to participate in conversation.   I spent time reviewing with family Katherine Archer complicated situation. They have been well informed and updated by Dr. Rosalva Comber and have good understanding of indicated amputation high above the knee as well as concern this may not heal well. We discussed expectations of comfort care and limited time if Katherine Archer reports she does not want an  amputation. Family report that she will be willing to proceed although she has not had good time to process and consider what this means. We did discuss the importance of QOL and how this will impact things she enjoys doing - like walking over to her neighbor who is a good friend. We discussed limitations in mobility and ongoing obstacles to improvement and progression if wound does not heal. We discussed the importance of having these conversations with Katherine Archer as early as possible to get her time to process and also discuss before she has worsening pain burden and more difficult to have good conversations if she is requiring more pain medication.   Family voice concerns over Katherine Archer not wanting to take medications (may be a challenge to treat her pain). They worry about ongoing depression since her son died in 06-29-2019 but do not feel she would be open to medication assistance. They also report that she will not want to go to rehab or long term care. We discussed options of care in brief but with the caveat that this really depends on how she progresses and her care needs. Family do report there are other family that would be willing to  come stay with her to care for her at home if needed. Ultimate goal would be for her to return home. They do report that she is [place] and would welcome additional support from spiritual care department.   All questions/concerns addressed. Emotional support provided.   Primary Decision Maker PATIENT Sister Katherine Archer noted to be elected decision maker although no formal documentation Daughters Katherine Archer and Katherine Archer involved in conversations    SUMMARY OF RECOMMENDATIONS   - Ongoing discussions - Plans for conference call with daughters tomorrow 5/27 1pm  Code Status/Advance Care Planning: Full code - did not discuss today   Symptom Management:  Per VVS, PCCM  Prognosis:  To be determined  Discharge Planning: To Be Determined      Primary Diagnoses: Present on  Admission:  Septic shock (HCC)   I have reviewed the medical record, interviewed the patient and family, and examined the patient. The following aspects are pertinent.  Past Medical History:  Diagnosis Date   Cervical polyp 09/18/2018   Critical lower limb ischemia (HCC)    Encounter for screening mammogram for breast cancer 05/05/2016   Fibrocystic breast changes of both breasts 04/29/2015   Hyperlipidemia    Hypertension    PAD (peripheral artery disease) (HCC) 08/23/2019   Stroke (HCC) 08/23/2019   Social History   Socioeconomic History   Marital status: Divorced    Spouse name: Not on file   Number of children: Not on file   Years of education: Not on file   Highest education level: Not on file  Occupational History   Not on file  Tobacco Use   Smoking status: Former    Current packs/day: 0.00    Types: Cigarettes    Quit date: 10/04/2019    Years since quitting: 3.6    Passive exposure: Never   Smokeless tobacco: Never  Vaping Use   Vaping status: Never Used  Substance and Sexual Activity   Alcohol  use: No   Drug use: No   Sexual activity: Not Currently  Other Topics Concern   Not on file  Social History Narrative   Not on file   Social Drivers of Health   Financial Resource Strain: Not on file  Food Insecurity: No Food Insecurity (05/12/2023)   Hunger Vital Sign    Worried About Running Out of Food in the Last Year: Never true    Ran Out of Food in the Last Year: Never true  Transportation Needs: No Transportation Needs (05/12/2023)   PRAPARE - Administrator, Civil Service (Medical): No    Lack of Transportation (Non-Medical): No  Physical Activity: Not on file  Stress: Not on file  Social Connections: Socially Isolated (05/12/2023)   Social Connection and Isolation Panel [NHANES]    Frequency of Communication with Friends and Family: More than three times a week    Frequency of Social Gatherings with Friends and Family: More than three times a week     Attends Religious Services: Never    Database administrator or Organizations: No    Attends Banker Meetings: Never    Marital Status: Widowed   Family History  Problem Relation Age of Onset   Heart Problems Father    Heart Problems Brother    Heart Problems Brother    Scheduled Meds:  [MAR Hold] sodium chloride    Intravenous Once   [MAR Hold] Chlorhexidine  Gluconate Cloth  6 each Topical Daily   gentamicin (GARAMYCIN) 80 mg in sodium chloride  0.9 %  500 mL irrigation  80 mg Irrigation Once   [MAR Hold] mupirocin ointment  1 Application Nasal BID   [MAR Hold] sodium chloride  flush  10-40 mL Intracatheter Q12H   Continuous Infusions:  sodium chloride  Stopped (05/29/23 0041)   [MAR Hold] sodium chloride      lactated ringers  150 mL/hr at 05/29/23 0741   [MAR Hold] norepinephrine (LEVOPHED) Adult infusion Stopped (05/29/23 0827)   [MAR Hold] piperacillin-tazobactam (ZOSYN)  IV 12.5 mL/hr at 05/29/23 0600   [MAR Hold] vancomycin      PRN Meds:.0.9 % irrigation (POUR BTL), [MAR Hold] docusate sodium , heparin , [MAR Hold] ondansetron  (ZOFRAN ) IV, [MAR Hold] polyethylene glycol, [MAR Hold] sodium chloride  flush Allergies  Allergen Reactions   Amoxicillin Other (See Comments)    Skin infection on face   Penicillins Other (See Comments)    Shaky and Dizziness 20 years ago   Review of Systems  Unable to perform ROS: Acuity of condition    Physical Exam Vitals and nursing note reviewed.  Constitutional:      General: She is sleeping. She is not in acute distress. Cardiovascular:     Rate and Rhythm: Normal rate.  Pulmonary:     Effort: No tachypnea, accessory muscle usage or respiratory distress.  Abdominal:     Palpations: Abdomen is soft.  Skin:    Comments: RLE groin incision not assessed. VAC and JPs in place.   Neurological:     Comments: Sleepy after surgery     Vital Signs: BP (!) 140/48   Pulse 80   Temp 98.2 F (36.8 C) (Oral)   Resp 17   Ht 5'  6" (1.676 m)   Wt 57.7 kg   SpO2 96%   BMI 20.53 kg/m  Pain Scale: 0-10   Pain Score: 4    SpO2: SpO2: 96 % O2 Device:SpO2: 96 % O2 Flow Rate: .   IO: Intake/output summary:  Intake/Output Summary (Last 24 hours) at 05/29/2023 0948 Last data filed at 05/29/2023 7846 Gross per 24 hour  Intake 5411.18 ml  Output 1995 ml  Net 3416.18 ml    LBM: Last BM Date : 05/28/23 Baseline Weight: Weight: 56.7 kg Most recent weight: Weight: 57.7 kg     Palliative Assessment/Data:    Time Total: 80 min  Greater than 50%  of this time was spent counseling and coordinating care related to the above assessment and plan.  Signed by: Vila Grayer, NP Palliative Medicine Team Pager # [phone] (M-F 8a-5p) Team Phone # [phone] (Nights/Weekends)

## 2023-05-29 NOTE — ED Notes (Signed)
 RT called to evaluate A-line; reading hypertensive, manual BP continues to be hypotensive.

## 2023-05-29 NOTE — ED Notes (Signed)
 Per MD Rosalva Comber, whom viewed the X-ray at bedside, [employer] is okay to use.

## 2023-05-29 NOTE — Anesthesia Preprocedure Evaluation (Addendum)
 Anesthesia Evaluation  Patient identified by MRN, date of birth, ID band  Reviewed: Allergy & Precautions, Patient's Chart, lab work & pertinent test results, Unable to perform ROS - Chart review onlyPreop documentation limited or incomplete due to emergent nature of procedure.  Airway        Dental   Pulmonary former smoker          Cardiovascular hypertension, Pt. on medications + CAD and + Peripheral Vascular Disease    1. Left ventricular ejection fraction, by estimation, is 60 to 65%. The  left ventricle has normal function. The left ventricle has no regional  wall motion abnormalities. There is mild concentric left ventricular  hypertrophy. Left ventricular diastolic  parameters are consistent with Grade II diastolic dysfunction  (pseudonormalization). Elevated left atrial pressure.   2. Right ventricular systolic function is normal. The right ventricular  size is normal. There is mildly elevated pulmonary artery systolic  pressure. The estimated right ventricular systolic pressure is 36.3 mmHg.   3. The mitral valve is normal in structure. No evidence of mitral valve  regurgitation. No evidence of mitral stenosis.   4. The aortic valve is normal in structure. Aortic valve regurgitation is  not visualized. No aortic stenosis is present.   5. The inferior vena cava is dilated in size with <50% respiratory  variability, suggesting right atrial pressure of 15 mmHg.     Neuro/Psych  Neuromuscular disease CVA, Residual Symptoms  negative psych ROS   GI/Hepatic negative GI ROS, Neg liver ROS,,,  Endo/Other  negative endocrine ROS    Renal/GU negative Renal ROSLab Results      Component                Value               Date                      NA                       135                 05/29/2023                K                        3.5                 05/29/2023                CO2                      23                   05/29/2023                GLUCOSE                  101 (H)             05/29/2023                BUN                      9                   05/29/2023  CREATININE               0.80                05/29/2023                CALCIUM                   7.2 (L)             05/29/2023                GFRNONAA                 >60                 05/29/2023                Musculoskeletal Ambulates with cane   Abdominal   Peds  Hematology Lab Results      Component                Value               Date                      WBC                      19.9 (H)            05/29/2023                HGB                      10.2 (L)            05/29/2023                HCT                      29.3 (L)            05/29/2023                MCV                      86.4                05/29/2023                PLT                      172                 05/29/2023              Anesthesia Other Findings   Reproductive/Obstetrics                             Anesthesia Physical Anesthesia Plan  ASA: 4  Anesthesia Plan: General   Post-op Pain Management:    Induction: Intravenous  PONV Risk Score and Plan: 3 and Ondansetron , Dexamethasone  and Treatment may vary due to age or medical condition  Airway Management Planned: Oral ETT  Additional Equipment: Arterial line  Intra-op Plan:   Post-operative Plan: Possible Post-op intubation/ventilation  Informed Consent:   Plan Discussed with: CRNA  Anesthesia Plan Comments:        Anesthesia Quick Evaluation

## 2023-05-29 NOTE — H&P (Signed)
 NAME:  Katherine Archer, MRN:  [id number], DOB:  20-Dec-1955, LOS: 0 ADMISSION DATE:  05/28/2023, CONSULTATION DATE:  5/26 REFERRING MD:  EDP, CHIEF COMPLAINT:  RLE pain and swelling   History of Present Illness:  68 yo female with extensive vascular related pmh presents today with worsening RLE pain and swelling. She also endorses dizziness and inability to ambulate on R leg 2/2 pain. Daughter is at bedside and states that pt had a fem-pop bypass with redo and recently worsening swelling. She was also having purulent drainage from the wound site. She has been having fevers up to 106 at home per family. She also is having chills. Nausea no vomiting. No syncope but near syncope.   Upon arrival her R LE is considerably more swollen than Left. She has multiple open sites at the graft site with drainage actively leaking. R calf has been wrapped by vascular after eval. They are planning for debridement in OR in am. She was also noted to have hgb of 4.4, hypotensive on levo, temp >104. Denies numbness  Ccm has been asked to admit  Pertinent  Medical History  Htn Pad Chronic eliquis  use Hyperlipidemia H/o cva 2021  Significant Hospital Events: Including procedures, antibiotic start and stop dates in addition to other pertinent events   Admitted to icu 5/26  Interim History / Subjective:    Objective    Blood pressure (!) 129/109, pulse 75, temperature 98 F (36.7 C), temperature source Oral, resp. rate 18, height 5\' 6"  (1.676 m), weight 56.7 kg, SpO2 99%.        Intake/Output Summary (Last 24 hours) at 05/29/2023 0141 Last data filed at 05/29/2023 0040 Gross per 24 hour  Intake 2300 ml  Output --  Net 2300 ml   Filed Weights   05/28/23 2034  Weight: 56.7 kg    Examination: General: appears acutely ill HENT: ncat, eomi, perrla, mmmp Lungs: ctab Cardiovascular: tachycardic Abdomen: soft, nt, nd bs+ Extremities: RLE with open graft site and purulent drainage noted, R le with extensive  swelling and dressing in place on R calf, ttp, warm Neuro: no focal deficits GU: deferred  Resolved problem list   Assessment and Plan  Septic shock 2/2 infected R fem pop bypass Acute anemia without overt bleeding Leukocytosis H/o htn Hyperlipidemia -f/u with vascular recommendations -empiric abx -f/u cultures -plan for OR in am -cont volume -titrate vasopressor to map >65 -transfuse prbc to hgb >7 -hemoccult negative, ppi -coags without need for replacement at this time  Best Practice (right click and "Reselect all SmartList Selections" daily)   Diet/type: NPO DVT prophylaxis SCD to L le, hold a/c Pressure ulcer(s): present on admission  GI prophylaxis: PPI Lines: Central line Foley:  Yes, and it is still needed Code Status:  full code Last date of multidisciplinary goals of care discussion [d/w daughter and pt at bedside 5/26]  Labs   CBC: Recent Labs  Lab 05/28/23 2120  WBC 34.2*  NEUTROABS 30.4*  HGB 4.4*  HCT 12.7*  MCV 93.4  PLT 285    Basic Metabolic Panel: Recent Labs  Lab 05/28/23 2245  NA 128*  K 3.3*  CL 96*  CO2 18*  GLUCOSE 97  BUN 13  CREATININE 0.90  CALCIUM  7.3*   GFR: Estimated Creatinine Clearance: 55 mL/min (by C-G formula based on SCr of 0.9 mg/dL). Recent Labs  Lab 05/28/23 2120 05/28/23 2135  WBC 34.2*  --   LATICACIDVEN  --  1.6    Liver Function Tests:  Recent Labs  Lab 05/28/23 2245  AST 209*  ALT 124*  ALKPHOS 80  BILITOT 1.3*  PROT 4.8*  ALBUMIN  1.8*   No results for input(s): "LIPASE", "AMYLASE" in the last 168 hours. No results for input(s): "AMMONIA" in the last 168 hours.  ABG    Component Value Date/Time   TCO2 27 08/23/2019 0753     Coagulation Profile: Recent Labs  Lab 05/29/23 0030  INR 1.5*    Cardiac Enzymes: No results for input(s): "CKTOTAL", "CKMB", "CKMBINDEX", "TROPONINI" in the last 168 hours.  HbA1C: Hgb A1c MFr Bld  Date/Time Value Ref Range Status  08/24/2019 01:42 AM  5.3 4.8 - 5.6 % Final    Comment:    (NOTE) Pre diabetes:          5.7%-6.4%  Diabetes:              >6.4%  Glycemic control for   <7.0% adults with diabetes     CBG: No results for input(s): "GLUCAP" in the last 168 hours.  Review of Systems:   As per hpi  Past Medical History:  She,  has a past medical history of Cervical polyp (09/18/2018), Critical lower limb ischemia (HCC), Encounter for screening mammogram for breast cancer (05/05/2016), Fibrocystic breast changes of both breasts (04/29/2015), Hyperlipidemia, Hypertension, PAD (peripheral artery disease) (HCC) (08/23/2019), and Stroke (HCC) (08/23/2019).   Surgical History:   Past Surgical History:  Procedure Laterality Date   ABDOMINAL AORTOGRAM W/LOWER EXTREMITY Bilateral 08/23/2019   Procedure: ABDOMINAL AORTOGRAM W/ Bilateral LOWER EXTREMITY [place];  Surgeon: Richrd Char, MD;  Location: [facility];  Service: Cardiovascular;  Laterality: Bilateral;   APPLICATION, SKIN SUBSTITUTE Right 05/12/2023   Procedure: APPLICATION, SKIN SUBSTITUTE KERECIS 38SQ CM;  Surgeon: Margherita Shell, MD;  Location: [facility] OR;  Service: Vascular;  Laterality: Right;   AXILLARY-FEMORAL BYPASS GRAFT Right 11/11/2019   Procedure: BYPASS GRAFT RIGHT AXILLA-BIFEMORAL;  Surgeon: Richrd Char, MD;  Location: [facility] OR;  Service: Vascular;  Laterality: Right;   BRAIN SURGERY     BREAST SURGERY Bilateral 2014   1980 R breast surgery   CEREBRAL ANEURYSM REPAIR     ENDARTERECTOMY FEMORAL Bilateral 11/11/2019   Procedure: ENDARTERECTOMY FEMORAL BILATERALLY;  Surgeon: Richrd Char, MD;  Location[facility];  Service: Vascular;  Laterality: Bilateral;   ENDARTERECTOMY FEMORAL Right 01/26/2022   Procedure: REDO RIGHT COMMON FEMORAL ENDARTERECTOMY USING 0.8CM X 7.6CM HEMASHIELD PLATINUM FINESSE PATCH;  Surgeon: Margherita Shell, MD;  Location: [facility] OR;  Service: Vascular;  Laterality: Right;   FEMORAL-POPLITEAL BYPASS GRAFT Right 01/26/2022   Procedure:  RIGHT FEMORAL-POPLITEAL BYPASS GRAFT USING GORE X 80CM PROPATEN GRAFT;  Surgeon: Margherita Shell, MD;  Location: [facility] OR;  Service: Vascular;  Laterality: Right;   FEMORAL-POPLITEAL BYPASS GRAFT Right 05/12/2023   Procedure: BYPASS GRAFT FEMORAL-POPLITEAL ARTERY;  Surgeon: Margherita Shell, MD;  Location: [facility] OR;  Service: Vascular;  Laterality: Right;   IR RADIOLOGIST EVAL & MGMT  04/02/2020   IR RADIOLOGIST EVAL & MGMT  03/29/2021   THROMBECTOMY FEMORAL ARTERY Right 01/26/2022   Procedure: THROMBECTOMY RIGHT FEMORAL ARTERY;  Surgeon: Margherita Shell, MD;  Location: [facility] OR;  Service: Vascular;  Laterality: Right;   THROMBECTOMY OF BYPASS GRAFT FEMORAL- TIBIAL ARTERY Right 05/12/2023   Procedure: THROMBECTOMY, BYPASS GRAFT, ARTERIAL;  Surgeon: Margherita Shell, MD;  Location: [facility] OR;  Service: Vascular;  Laterality: Right;   TUBAL LIGATION  1990     Social History:  reports that she quit smoking about 3 years ago. Her smoking use included cigarettes. She has never been exposed to tobacco smoke. She has never used smokeless tobacco. She reports that she does not drink alcohol  and does not use drugs.   Family History:  Her family history includes Heart Problems in her brother, brother, and father.   Allergies Allergies  Allergen Reactions   Amoxicillin Other (See Comments)    Skin infection on face   Penicillins Other (See Comments)    Shaky and Dizziness 20 years ago     Home Medications  Prior to Admission medications   Medication Sig Start Date End Date Taking? Authorizing Provider  alendronate (FOSAMAX) 70 MG tablet Take 70 mg by mouth once a week. 12/28/20  Yes [provider]  apixaban  (ELIQUIS ) 5 MG TABS tablet Take 1 tablet (5 mg total) by mouth 2 (two) times daily. 05/15/23 08/13/23 Yes Krishnan, Gokul, MD  ezetimibe  (ZETIA ) 10 MG tablet Take 10 mg by mouth daily. 11/18/21  Yes [provider]  hydrALAZINE  (APRESOLINE ) 25 MG tablet Take 25 mg by mouth 3 (three)  times daily as needed (for BP >180/100). 05/15/23  Yes [provider]  rosuvastatin  (CRESTOR ) 40 MG tablet Take 40 mg by mouth daily. 11/21/21  Yes [provider]  TIADYLT  ER 180 MG 24 hr capsule Take 180 mg by mouth daily.   Yes [provider]  valsartan (DIOVAN) 160 MG tablet Take 160 mg by mouth daily. 11/18/21  Yes [provider]  acetaminophen  (TYLENOL ) 325 MG tablet Take 2 tablets (650 mg total) by mouth every 4 (four) hours as needed for mild pain (pain score 1-3). Patient not taking: Reported on 05/28/2023 05/15/23   Krishnan, Gokul, MD  oxyCODONE -acetaminophen  (PERCOCET/ROXICET) 5-325 MG tablet Take 1 tablet by mouth every 8 (eight) hours as needed for severe pain (pain score 7-10). Patient not taking: Reported on 05/28/2023 05/15/23   Krishnan, Gokul, MD  polyethylene glycol powder (GLYCOLAX /MIRALAX ) 17 GM/SCOOP powder Take 1 capful (17 g) by mouth daily as needed for mild constipation. 05/15/23   Maylene Spear, MD     Critical care time: 

## 2023-05-29 NOTE — Progress Notes (Signed)
 eLink Physician-Brief Progress Note Patient Name: Katherine Archer DOB: 02-Dec-1955 MRN: [id number]   Date of Service  05/29/2023  HPI/Events of Note  33 F previous smoker, history of hypertension, dyslipidemia, CVA, PAD on apixaban , presents with septic shock secondary to redo fem-pop bypass graft infection. Also noted to have Hgb 4.4 with no report of active bleed.  eICU Interventions  Lines placed by vascular surgery who plans to bring patient to OR in AM for graft removal after blood resuscitation. Drainage of infection done at bedside.     Intervention Category Major Interventions: Seizures - evaluation and management Evaluation Type: New Patient Evaluation  Turner Gains 05/29/2023, 2:57 AM

## 2023-05-29 NOTE — Procedures (Signed)
 After placing the left sided arterial line, nursing noted that they were having a very difficult time with IV access.  Pressor needed to be started, therefore I gained consent from family, and moved to place a right internal jugular triple-lumen central line.  A kit was opened.  The neck was prepped and draped in standard fashion.  Using sterile technique, I accessed the right internal jugular vein using an ultrasound-guided needle.  From this, sheath was placed and manometry followed to ensure that I was not in the artery.  I was not.  A wire was then run into the inferior vena cava, and the site dilated with the kit dilator.  Next, the triple-lumen catheter was flushed, and using Seldinger technique placed into the SVC.  It was hubbed and sent into the IVC.  Special attention was taken to ensure that there was no arrhythmia.  There was not.  X-ray confirmed its placement.  While the catheter can be backed out several centimeters to the SVC, it can be utilized immediately.  I will discuss this with the ICU, and they can pull the catheter back should they deem it necessary.  At this point, I think it is more important to have the catheter running fluid.  No pneumothorax on x-ray.   Kayla Part MD

## 2023-05-29 NOTE — Op Note (Signed)
 NAME: Katherine Archer    MRN: [id number] DOB: 11-23-1955    DATE OF OPERATION: 05/29/2023  PREOP DIAGNOSIS:    Right lower extremity bypass graft infection  POSTOP DIAGNOSIS:    Same  PROCEDURE:    Reexposure right common femoral artery less than 30 days Reexposure right below-knee popliteal artery less than 30 days Reexposure right above-knee popliteal artery greater than 30 days  Ligation of the right axillobifemoral bypass graft limb Excision of the right common femoral artery Excision of right femoral to above-knee popliteal artery bypass with above-knee popliteal artery ligation Right sided greater saphenous vein saphenectomy Excision of the right femoral to below-knee popliteal artery bypass with greater saphenous vein patch  Femoral to above-knee popliteal bypass tunnel with 15 French drain Femoral to below-knee popliteal bypass tunnel with 19 French drain Groin with white and blue VAC sponge    SURGEON: Kayla Part  ASSIST: Cordie Deters, PA  ANESTHESIA: General  EBL: 500 mL  INDICATIONS:    Katherine Archer is a 68 y.o. female with prior history of right axillary to bifemoral bypass, right femoral to above-knee popliteal artery bypass, and most recently right femoral to below-knee popliteal artery bypass with PTFE.  She presented last night to the ED with sepsis and purulent drainage from the groin as well as below-knee popliteal artery incision site.  An arterial and central line were placed and the patient was taken to the ICU for resuscitation due to labs demonstrating a hemoglobin of 4.  I had a long discussion with family last night regarding the severity of the infection, as well as my concern that the axillobifemoral bypass graft was also infected.  They were aware that this is a limb loss scenario, and may result in mortality.  After discussing risks and benefits of bypass excision for source control, but need to elected to proceed.  FINDINGS:    Purulence throughout the wound beds involving both the previous femoral to above-knee popliteal artery bypass as well as the most recent femoral to below-knee popliteal artery bypass. When I clamped the right common femoral artery, the artery fell apart leading to significant bleeding.  I was able to gain control of the right dacryon limb.  The right limb was ligated with 3-0 Prolene suture.  The area that was ligated was well incorporated prior to exposure.  I then moved to resect all prosthetic material from the right leg.  The common femoral artery was resected in its entirety due to the majority of it being dacryon graft from previous surgeries.  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  Both prior PTFE bypasses were excised with the above-knee popliteal artery ligated and the below-knee popliteal artery patched with greater saphenous vein. Drains were placed in tunnel tracks VAC placed in right groin with balloon white sponge.  Will likely need high above-knee amputation that may or may not heal as the patient has only collaterals from the hypogastric artery filling the leg.    TECHNIQUE:   Patient brought to the OR laid in supine position.  General anesthesia was induced and the patient was prepped and draped in standard fashion.  The case began with reopening the groin and below-knee popliteal artery incisions.  Purulence was immediately encountered in the prior bypass graft with swimming and the wound bed.  After seeing this, culture swabs were taken and sent to microbiology.  I then elected to reexpose the above-knee popliteal  artery as the purulence tracked along the prior bypass graft.  The distal anastomosis of the above-knee popliteal artery bypass was exposed, and ligated using 2-0 silk both proximally and distally.  The end of the bypass was then taken off the native artery, the graft was removed and replaced with a 15 [place]  drain.  Next, my attention turned to the most recent femoral to above-knee popliteal artery bypass.  I moved to clamp to the common femoral artery, and upon clamping I had a level that seemed well incorporated, the entirety of the artery fell apart.  There was no remaining posterior wall and the patient had free hemorrhage.  I was able to control the hood of the bypass graft, and worked proximally to expose more of the right femoral limb of the previously placed axillobifemoral bypass graft.  The limb was clamped.  There was backbleeding from the native artery.  Upon further inspection, the posterior wall of the artery was nonexistent.  There was no way to salvage the right common femoral artery, therefore the proximal common femoral artery was ligated using 2-0 silk suture as well as 4-0 Prolene, and the artery was resected out of the wound bed due to the significant amount of dacryon from multiple prior procedures.  The dacryon which was previously extended onto the profunda, now chronically occluded, was also resected.  At this point, the proximal end of the popliteal artery bypass was taken off of the common femoral artery and pulled through into the below-knee popliteal artery incision.  I did not want to limit future options, understanding that the patient would most likely require a high AKA, but I elected to perform a small saphenectomy at the mid thigh where there was a small piece of usable saphenous vein.  I was able to control the below-knee popliteal artery with Vesseloops, remove the distal hood of the prior bypass graft, and so a patch using 6-0 Prolene in standard fashion.  There was only a trickle of bleeding antegrade when the popliteal artery was opened.  Once the saphenous vein patch was sewn into the below-knee popliteal artery.  I then moved to inspect the groin incision again.  All wound beds were irrigated with copious amounts of antibiotic laden saline.  As stated previously, a 15  French drain was placed in the above-knee popliteal artery tunnel, 19 placed in the below-knee popliteal artery tunnel.  With an occluded profunda and superficial femoral artery.  I did not think there was a reason to reconstruct of the common femoral artery, nor did I have conduit to do so.  Antibiotic beads were placed in all 3 incision sites.  White and blue sponge were placed in the groin incision, the white sponge, specifically over the ligated BioFem limb.  Special attention was taken to ensure that the limb was ligated at the bifurcation point and did not impact flow to the left lower extremity.  The above-knee and below-knee popliteal artery incisions were closed in layers using Vicryl with staples at the level of the skin with drains.   At this time, I think that the patient will be lucky to salvage a high above-knee amputation.  The axillobifemoral bypass graft was well incorporated where it was sewn in.  I think that we can continue to watch this.  My plan is to discuss her care with my partners, and take her back to the OR in the coming days once that she has been resuscitated for definitive management.  This will  likely involve above-knee amputation.    Kayla Part, MD [facility] DATE OF DICTATION:   05/29/2023

## 2023-05-29 NOTE — Progress Notes (Signed)
 05/29/2023 Seen and examined postop. Case discussed with VVS. Currently HD stable and pain okay. VAC in place, has a couple Jps also, no pulse in RLE Not many great options going forward, will be joint VVS/palliative discussion. In meantime, pain control, antibiotics, fluids. Appreciate VVS help.  Ardelle Kos MD PCCM

## 2023-05-29 NOTE — Anesthesia Procedure Notes (Signed)
 Procedure Name: Intubation Date/Time: 05/29/2023 7:54 AM  Performed by: Gabe Jock, CRNAPre-anesthesia Checklist: Patient identified, Emergency Drugs available, Suction available and Patient being monitored Patient Re-evaluated:Patient Re-evaluated prior to induction Oxygen Delivery Method: Circle System Utilized Preoxygenation: Pre-oxygenation with 100% oxygen Induction Type: IV induction Ventilation: Mask ventilation without difficulty Laryngoscope Size: Mac and 4 Grade View: Grade I Tube type: Oral Tube size: 7.0 mm Number of attempts: 1 Airway Equipment and Method: Stylet Placement Confirmation: ETT inserted through vocal cords under direct vision, positive ETCO2 and breath sounds checked- equal and bilateral Secured at: 22 cm Tube secured with: Tape Dental Injury: Teeth and Oropharynx as per pre-operative assessment

## 2023-05-29 NOTE — Procedures (Signed)
 In short patient is a 68 year old female presenting with wound dehiscence, sepsis with history of right-sided femoral-popliteal bypass surgery using PTFE  On initial exam, the patient was hypotensive.  Access was poor.  I moved to place an arterial line.  I got consent from the patient's daughter. An A-line kit was opened the left wrist was prepped out in standard fashion.  Sterile technique was used.  An ultrasound-guided needle was used to access the left radial artery using Seldinger technique, the sheath from an Arrow cath was placed in the left radial artery.  It was sewn in place using silk suture, and hooked up to the premade arterial line bag.   Yeng Perz E Jden Want

## 2023-05-29 NOTE — Transfer of Care (Signed)
 Immediate Anesthesia Transfer of Care Note  Patient: Katherine Archer  Procedure(s) Performed: REMOVAL BYPASS GRAFT RIGHT LEG (Right)  Patient Location: PACU  Anesthesia Type:General  Level of Consciousness: drowsy  Airway & Oxygen Therapy: Patient Spontanous Breathing and Patient connected to nasal cannula oxygen  Post-op Assessment: Report given to RN, Post -op Vital signs reviewed and stable, and Patient moving all extremities  Post vital signs: Reviewed and stable  Last Vitals:  Vitals Value Taken Time  BP 145/63 05/29/23 1100  Temp 37.2 C 05/29/23 1059  Pulse 70 05/29/23 1103  Resp 17 05/29/23 1103  SpO2 94 % 05/29/23 1103  Vitals shown include unfiled device data.  Last Pain:  Vitals:   05/29/23 0420  TempSrc: Oral  PainSc:          Complications: No notable events documented.

## 2023-05-29 NOTE — Progress Notes (Addendum)
 Pharmacy Antibiotic Note  Katherine Archer is a 68 y.o. female admitted on 05/28/2023 with sepsis and bypass graft infection.  Pharmacy has been consulted for vancomycin  and Zosyn dosing.  Plan: Vancomycin  500mg  IV Q12H. Goal AUC 400-550.  Expected AUC 500. Zosyn 3.375g IV Q8H (4-hour infusion).  Height: 5\' 6"  (167.6 cm) Weight: 56.7 kg (125 lb) IBW/kg (Calculated) : 59.3  Temp (24hrs), Avg:99.5 F (37.5 C), Min:98 F (36.7 C), Max:103.3 F (39.6 C)  Recent Labs  Lab 05/28/23 2120 05/28/23 2135 05/28/23 2245  WBC 34.2*  --   --   CREATININE  --   --  0.90  LATICACIDVEN  --  1.6  --     Estimated Creatinine Clearance: 55 mL/min (by C-G formula based on SCr of 0.9 mg/dL).    Allergies  Allergen Reactions   Amoxicillin Other (See Comments)    Skin infection on face   Penicillins Other (See Comments)    Shaky and Dizziness 20 years ago    Thank you for allowing pharmacy to be a part of this patient's care.  Lonnie Roberts, PharmD, BCPS  05/29/2023 2:13 AM

## 2023-05-29 NOTE — Consult Note (Signed)
 Hospital Consult    Reason for Consult: Right lower extremity bypass infection Requesting Physician: ED MRN #:  [id number]  History of Present Illness: This is a 68 y.o. female with recent history of right-sided redo femoral-popliteal bypass graft using 6 mm PTFE.  She presents with purulent drainage to the ED at the request of her daughter and granddaughter.  Supposedly this has been going on for roughly a week.  On exam, she was hypotensive, necessitating arterial line and central line which I placed bedside.  Family was used for translation services as this is her daughter's occupation and she has to use her daughter. Notes fever, chills.  Prior history includes right-sided axillobifemoral bypass graft  Past Medical History:  Diagnosis Date   Cervical polyp 09/18/2018   Critical lower limb ischemia (HCC)    Encounter for screening mammogram for breast cancer 05/05/2016   Fibrocystic breast changes of both breasts 04/29/2015   Hyperlipidemia    Hypertension    PAD (peripheral artery disease) (HCC) 08/23/2019   Stroke (HCC) 08/23/2019    Past Surgical History:  Procedure Laterality Date   ABDOMINAL AORTOGRAM W/LOWER EXTREMITY Bilateral 08/23/2019   Procedure: ABDOMINAL AORTOGRAM W/ Bilateral LOWER EXTREMITY Runoff;  Surgeon: Richrd Char, MD;  Location: [facility];  Service: Cardiovascular;  Laterality: Bilateral;   APPLICATION, SKIN SUBSTITUTE Right 05/12/2023   Procedure: APPLICATION, SKIN SUBSTITUTE KERECIS 38SQ CM;  Surgeon: Margherita Shell, MD;  Location: [facility] OR;  Service: Vascular;  Laterality: Right;   AXILLARY-FEMORAL BYPASS GRAFT Right 11/11/2019   Procedure: BYPASS GRAFT RIGHT AXILLA-BIFEMORAL;  Surgeon: Richrd Char, MD;  Location: [facility] OR;  Service: Vascular;  Laterality: Right;   BRAIN SURGERY     BREAST SURGERY Bilateral 2014   1980 R breast surgery   CEREBRAL ANEURYSM REPAIR     ENDARTERECTOMY FEMORAL Bilateral 11/11/2019   Procedure: ENDARTERECTOMY  FEMORAL BILATERALLY;  Surgeon: Richrd Char, MD;  Location: [facility] OR;  Service: Vascular;  Laterality: Bilateral;   ENDARTERECTOMY FEMORAL Right 01/26/2022   Procedure: REDO RIGHT COMMON FEMORAL ENDARTERECTOMY USING 0.8CM X 7.6CM HEMASHIELD PLATINUM FINESSE PATCH;  Surgeon: Margherita Shell, MD;  Location: [facility] OR;  Service: Vascular;  Laterality: Right;   FEMORAL-POPLITEAL BYPASS GRAFT Right 01/26/2022   Procedure: RIGHT FEMORAL-POPLITEAL BYPASS GRAFT USING GORE X 80CM PROPATEN GRAFT;  Surgeon: Margherita Shell, MD;  Location: [facility] OR;  Service: Vascular;  Laterality: Right;   FEMORAL-POPLITEAL BYPASS GRAFT Right 05/12/2023   Procedure: BYPASS GRAFT FEMORAL-POPLITEAL ARTERY;  Surgeon: Margherita Shell, MD;  Location: [facility] OR;  Service: Vascular;  Laterality: Right;   IR RADIOLOGIST EVAL & MGMT  04/02/2020   IR RADIOLOGIST EVAL & MGMT  03/29/2021   THROMBECTOMY FEMORAL ARTERY Right 01/26/2022   Procedure: THROMBECTOMY RIGHT FEMORAL ARTERY;  Surgeon: Margherita Shell, MD;  Location: [facility] OR;  Service: Vascular;  Laterality: Right;   THROMBECTOMY OF BYPASS GRAFT FEMORAL- TIBIAL ARTERY Right 05/12/2023   Procedure: THROMBECTOMY, BYPASS GRAFT, ARTERIAL;  Surgeon: Margherita Shell, MD;  Location: [facility] OR;  Service: Vascular;  Laterality: Right;   TUBAL LIGATION  1990    Allergies  Allergen Reactions   Amoxicillin Other (See Comments)    Skin infection on face   Penicillins Other (See Comments)    Shaky and Dizziness 20 years ago    Prior to Admission medications   Medication Sig Start Date End Date Taking? Authorizing Provider  alendronate (FOSAMAX) 70 MG tablet Take 70 mg by mouth once a  week. 12/28/20  Yes [provider]  apixaban  (ELIQUIS ) 5 MG TABS tablet Take 1 tablet (5 mg total) by mouth 2 (two) times daily. 05/15/23 08/13/23 Yes Maylene Spear, MD  ezetimibe  (ZETIA ) 10 MG tablet Take 10 mg by mouth daily. 11/18/21  Yes [provider]  hydrALAZINE  (APRESOLINE ) 25 MG tablet Take  25 mg by mouth 3 (three) times daily as needed (for BP >180/100). 05/15/23  Yes [provider]  rosuvastatin  (CRESTOR ) 40 MG tablet Take 40 mg by mouth daily. 11/21/21  Yes [provider]  TIADYLT  ER 180 MG 24 hr capsule Take 180 mg by mouth daily.   Yes [provider]  valsartan (DIOVAN) 160 MG tablet Take 160 mg by mouth daily. 11/18/21  Yes [provider]  acetaminophen  (TYLENOL ) 325 MG tablet Take 2 tablets (650 mg total) by mouth every 4 (four) hours as needed for mild pain (pain score 1-3). Patient not taking: Reported on 05/28/2023 05/15/23   Krishnan, Gokul, MD  oxyCODONE -acetaminophen  (PERCOCET/ROXICET) 5-325 MG tablet Take 1 tablet by mouth every 8 (eight) hours as needed for severe pain (pain score 7-10). Patient not taking: Reported on 05/28/2023 05/15/23   Krishnan, Gokul, MD  polyethylene glycol powder (GLYCOLAX /MIRALAX ) 17 GM/SCOOP powder Take 1 capful (17 g) by mouth daily as needed for mild constipation. 05/15/23   Maylene Spear, MD    Social History   Socioeconomic History   Marital status: Divorced    Spouse name: Not on file   Number of children: Not on file   Years of education: Not on file   Highest education level: Not on file  Occupational History   Not on file  Tobacco Use   Smoking status: Former    Current packs/day: 0.00    Types: Cigarettes    Quit date: 10/04/2019    Years since quitting: 3.6    Passive exposure: Never   Smokeless tobacco: Never  Vaping Use   Vaping status: Never Used  Substance and Sexual Activity   Alcohol  use: No   Drug use: No   Sexual activity: Not Currently  Other Topics Concern   Not on file  Social History Narrative   Not on file   Social Drivers of Health   Financial Resource Strain: Not on file  Food Insecurity: No Food Insecurity (05/12/2023)   Hunger Vital Sign    Worried About Running Out of Food in the Last Year: Never true    Ran Out of Food in the Last Year: Never true   Transportation Needs: No Transportation Needs (05/12/2023)   PRAPARE - Administrator, Civil Service (Medical): No    Lack of Transportation (Non-Medical): No  Physical Activity: Not on file  Stress: Not on file  Social Connections: Socially Isolated (05/12/2023)   Social Connection and Isolation Panel [NHANES]    Frequency of Communication with Friends and Family: More than three times a week    Frequency of Social Gatherings with Friends and Family: More than three times a week    Attends Religious Services: Never    Database administrator or Organizations: No    Attends Banker Meetings: Never    Marital Status: Widowed  Intimate Partner Violence: Not At Risk (05/12/2023)   Humiliation, Afraid, Rape, and Kick questionnaire    Fear of Current or Ex-Partner: No    Emotionally Abused: No    Physically Abused: No    Sexually Abused: No   Family History  Problem  Relation Age of Onset   Heart Problems Father    Heart Problems Brother    Heart Problems Brother     ROS: Otherwise negative unless mentioned in HPI  Physical Examination  Vitals:   05/28/23 2345 05/29/23 0000  BP: (!) 108/51 (!) 90/56  Pulse: 68 69  Resp: (!) 27 16  Temp:    SpO2: 96% 99%   Body mass index is 20.18 kg/m.  General:  WDWN in NAD Gait: Not observed HENT: WNL, normocephalic Pulmonary: normal non-labored breathing, without Rales, rhonchi,  wheezing Cardiac: regular, without  Murmurs, rubs or gallops; without carotid bruits Abdomen: soft, NT/ND, no masses Skin: without rashes Vascular Exam/Pulses: 2+ DP right Extremities: without ischemic changes, without Gangrene , without cellulitis; without open wounds;  Musculoskeletal: no muscle wasting or atrophy  Neurologic: A&O X 3;  No focal weakness or paresthesias are detected; speech is fluent/normal Psychiatric:  The pt has Normal affect. Lymph:  Unremarkable  CBC    Component Value Date/Time   WBC 34.2 (H) 05/28/2023 2120    RBC 1.36 (L) 05/28/2023 2120   HGB 4.4 (LL) 05/28/2023 2120   HGB 14.2 03/23/2021 0928   HCT 12.7 (L) 05/28/2023 2120   HCT 42.1 03/23/2021 0928   PLT 285 05/28/2023 2120   PLT 135 (L) 03/23/2021 0928   MCV 93.4 05/28/2023 2120   MCV 93 03/23/2021 0928   MCH 32.4 05/28/2023 2120   MCHC 34.6 05/28/2023 2120   RDW 13.5 05/28/2023 2120   RDW 12.0 03/23/2021 0928   LYMPHSABS 1.5 05/28/2023 2120   LYMPHSABS 1.9 03/23/2021 0928   MONOABS 0.9 05/28/2023 2120   EOSABS 0.0 05/28/2023 2120   EOSABS 0.3 03/23/2021 0928   BASOSABS 0.1 05/28/2023 2120   BASOSABS 0.0 03/23/2021 0928    BMET    Component Value Date/Time   NA 128 (L) 05/28/2023 2245   NA 138 01/15/2020 1128   K 3.3 (L) 05/28/2023 2245   CL 96 (L) 05/28/2023 2245   CO2 18 (L) 05/28/2023 2245   GLUCOSE 97 05/28/2023 2245   BUN 13 05/28/2023 2245   BUN 8 01/15/2020 1128   CREATININE 0.90 05/28/2023 2245   CALCIUM  7.3 (L) 05/28/2023 2245   GFRNONAA >60 05/28/2023 2245   GFRAA 80 01/15/2020 1128    COAGS: Lab Results  Component Value Date   INR 1.1 05/11/2023   INR 1.0 01/24/2022   INR 1.0 11/08/2019      ASSESSMENT/PLAN: This is a 68 y.o. female with right-sided femoral-popliteal graft infection.  She is septic but has a hemoglobin of 4.4.  I think she would do best with resuscitation prior to operating room for graft resection.  Will plan for tomorrow morning.  While in the room, I took an 11 blade and opened the below-knee popliteal artery incision to allow for drainage to help with source control.  The thigh was already opened from dermal necrosis.  Graft not visible in the wound bed.  I had a long conversation with Bonita's daughter at bedside.  I am very worried that her axillobifemoral bypass graft is also infected being that the most recent graft is sewn into this.   Regardless, the more urgent ischemic issue, in looking at Dr. Mick Alamin notes, is that the profunda is occluded so bypass ligation will cease  all blood flow to the right lower extremity.  The patient does have hypogastric artery that is patent.  I think healing an above-knee amputation will prove challenging.  At this time,  my plan is to go to the OR tomorrow morning for graft excision, washout.  Likely muscle flap over the exposed ax bifemoral limb.  Graft resection will be for source control.  I think the current infection will result in amputation, however very concerned that this may not heal.  In the patient's current state, I do not plan on pursuing amputation tomorrow, and want both her and her family to have more time to discuss whether she wants to pursue amputation or not.  Patient will be going to the ICU for resuscitation.  Both her, her daughter and granddaughter understand the above and to the need for resuscitation prior to OR.  With a hemoglobin of 4.4, the patient will need GI bleed workup. Will follow-up CT scan.   Kayla Part MD MS [facility] [phone] 05/29/2023  12:17 AM

## 2023-05-30 ENCOUNTER — Encounter (HOSPITAL_COMMUNITY): Payer: Self-pay | Admitting: Vascular Surgery

## 2023-05-30 DIAGNOSIS — Z515 Encounter for palliative care: Secondary | ICD-10-CM | POA: Diagnosis not present

## 2023-05-30 DIAGNOSIS — I96 Gangrene, not elsewhere classified: Secondary | ICD-10-CM | POA: Diagnosis not present

## 2023-05-30 DIAGNOSIS — B955 Unspecified streptococcus as the cause of diseases classified elsewhere: Secondary | ICD-10-CM | POA: Insufficient documentation

## 2023-05-30 DIAGNOSIS — B950 Streptococcus, group A, as the cause of diseases classified elsewhere: Secondary | ICD-10-CM

## 2023-05-30 DIAGNOSIS — I70221 Atherosclerosis of native arteries of extremities with rest pain, right leg: Secondary | ICD-10-CM | POA: Diagnosis not present

## 2023-05-30 DIAGNOSIS — Z7189 Other specified counseling: Secondary | ICD-10-CM | POA: Diagnosis not present

## 2023-05-30 DIAGNOSIS — R7881 Bacteremia: Secondary | ICD-10-CM | POA: Insufficient documentation

## 2023-05-30 DIAGNOSIS — T829XXA Unspecified complication of cardiac and vascular prosthetic device, implant and graft, initial encounter: Secondary | ICD-10-CM

## 2023-05-30 LAB — RENAL FUNCTION PANEL
Albumin: 1.6 g/dL — ABNORMAL LOW (ref 3.5–5.0)
Anion gap: 11 (ref 5–15)
BUN: 10 mg/dL (ref 8–23)
CO2: 24 mmol/L (ref 22–32)
Calcium: 7.9 mg/dL — ABNORMAL LOW (ref 8.9–10.3)
Chloride: 97 mmol/L — ABNORMAL LOW (ref 98–111)
Creatinine, Ser: 0.72 mg/dL (ref 0.44–1.00)
GFR, Estimated: >60 mL/min (ref >60)
Glucose, Bld: 116 mg/dL — ABNORMAL HIGH (ref 70–99)
Phosphorus: 2.5 mg/dL (ref 2.5–4.6)
Potassium: 3.7 mmol/L (ref 3.5–5.1)
Sodium: 132 mmol/L — ABNORMAL LOW (ref 135–145)

## 2023-05-30 LAB — CBC
HCT: 33.8 % — ABNORMAL LOW (ref 36.0–46.0)
Hemoglobin: 11.7 g/dL — ABNORMAL LOW (ref 12.0–15.0)
MCH: 29.7 pg (ref 26.0–34.0)
MCHC: 34.6 g/dL (ref 30.0–36.0)
MCV: 85.8 fL (ref 80.0–100.0)
Platelets: 192 10*3/uL (ref 150–400)
RBC: 3.94 MIL/uL (ref 3.87–5.11)
RDW: 15.6 % — ABNORMAL HIGH (ref 11.5–15.5)
WBC: 31 10*3/uL — ABNORMAL HIGH (ref 4.0–10.5)
nRBC: 0 % (ref 0.0–0.2)

## 2023-05-30 LAB — ALBUMIN: Albumin: 1.6 g/dL — ABNORMAL LOW (ref 3.5–5.0)

## 2023-05-30 LAB — BASIC METABOLIC PANEL WITH GFR
Anion gap: 8 (ref 5–15)
BUN: 10 mg/dL (ref 8–23)
CO2: 22 mmol/L (ref 22–32)
Calcium: 7.6 mg/dL — ABNORMAL LOW (ref 8.9–10.3)
Chloride: 103 mmol/L (ref 98–111)
Creatinine, Ser: 0.71 mg/dL (ref 0.44–1.00)
GFR, Estimated: >60 mL/min (ref >60)
Glucose, Bld: 127 mg/dL — ABNORMAL HIGH (ref 70–99)
Potassium: 3.4 mmol/L — ABNORMAL LOW (ref 3.5–5.1)
Sodium: 133 mmol/L — ABNORMAL LOW (ref 135–145)

## 2023-05-30 LAB — GLUCOSE, CAPILLARY
Glucose-Capillary: 111 mg/dL — ABNORMAL HIGH (ref 70–99)
Glucose-Capillary: 140 mg/dL — ABNORMAL HIGH (ref 70–99)
Glucose-Capillary: 127 mg/dL — ABNORMAL HIGH (ref 70–99)
Glucose-Capillary: 119 mg/dL — ABNORMAL HIGH (ref 70–99)
Glucose-Capillary: 125 mg/dL — ABNORMAL HIGH (ref 70–99)

## 2023-05-30 LAB — HEMOGLOBIN A1C
Hgb A1c MFr Bld: 4.7 % — ABNORMAL LOW (ref 4.8–5.6)
Mean Plasma Glucose: 88.19 mg/dL

## 2023-05-30 LAB — PHOSPHORUS: Phosphorus: 2 mg/dL — ABNORMAL LOW (ref 2.5–4.6)

## 2023-05-30 MED ORDER — POTASSIUM CHLORIDE CRYS ER 20 MEQ PO TBCR
40.0000 meq | EXTENDED_RELEASE_TABLET | Freq: Once | ORAL | Status: AC
  Administered 2023-05-30: 40 meq via ORAL
  Filled 2023-05-30: qty 2

## 2023-05-30 MED ORDER — ACETAMINOPHEN 325 MG PO TABS
ORAL_TABLET | ORAL | Status: DC
Start: 2023-05-30 — End: 2023-05-30
  Filled 2023-05-30: qty 1

## 2023-05-30 MED ORDER — ACETAMINOPHEN 325 MG PO TABS
650.0000 mg | ORAL_TABLET | Freq: Four times a day (QID) | ORAL | Status: DC | PRN
  Administered 2023-05-30: 650 mg via ORAL

## 2023-05-30 MED ORDER — LINEZOLID 600 MG/300ML IV SOLN
600.0000 mg | Freq: Two times a day (BID) | INTRAVENOUS | Status: DC
  Administered 2023-05-30 – 2023-06-01 (×4): 600 mg via INTRAVENOUS
  Filled 2023-05-30 (×5): qty 300

## 2023-05-30 MED ORDER — ACETAMINOPHEN 325 MG PO TABS
ORAL_TABLET | ORAL | Status: AC
  Filled 2023-05-30: qty 2

## 2023-05-30 MED ORDER — PENICILLIN G POTASSIUM 20000000 UNITS IJ SOLR
12.0000 10*6.[IU] | Freq: Two times a day (BID) | INTRAVENOUS | Status: DC
  Administered 2023-05-30 – 2023-06-01 (×4): 12 10*6.[IU] via INTRAVENOUS
  Filled 2023-05-30 (×5): qty 12

## 2023-05-30 NOTE — Progress Notes (Signed)
 [facility] ADULT ICU REPLACEMENT PROTOCOL   The patient does apply for the [facility] Adult ICU Electrolyte Replacment Protocol based on the criteria listed below:   1.Exclusion criteria: TCTS, ECMO, Dialysis, and Myasthenia Gravis patients 2. Is GFR >/= 30 ml/min? Yes.    Patient's GFR today is >60 3. Is SCr </= 2? Yes.   Patient's SCr is 0.71 mg/dL 4. Did SCr increase >/= 0.5 in 24 hours? No. 5.Pt's weight >40kg  Yes.   6. Abnormal electrolyte(s): K+ 3.4  7. Electrolytes replaced per protocol 8.  Call MD STAT for K+ </= 2.5, Phos </= 1, or Mag </= 1 Physician:  Dr. Lottie Roulette, Doloris Freund 05/30/2023 6:29 AM

## 2023-05-30 NOTE — TOC Progression Note (Addendum)
 Transition of Care [facility]) - Progression Note    Patient Details  Name: Katherine Archer MRN: [id number] Date of Birth: [birth year]  Transition of Care [facility]) CM/SW Contact  Benjiman Bras, RN Phone Number: [phone] 05/30/2023, 5:35 PM  Clinical Narrative:    TOC CM spoke to pt and grand-dtr, Melody at bedside. States pt will be able to come stay with her after dc. She works from home. Pt has declined HH. She has RW and cane at home.   Gave permission to speak to dtr, [name]. Discussed IP rehab vs HH with dtr. Dtr states she is agreeable to weighing out options closer to dc.     Will continue to follow for dc needs.    Expected Discharge Plan: Home w Home Health Services Barriers to Discharge: Continued Medical Work up  Expected Discharge Plan and Services   Discharge Planning Services: CM Consult Post Acute Care Choice: Home Health Living arrangements for the past 2 months: Apartment                           HH Arranged: Refused HH           Social Determinants of Health (SDOH) Interventions SDOH Screenings   Food Insecurity: No Food Insecurity (05/30/2023)  Housing: Low Risk  (05/30/2023)  Transportation Needs: No Transportation Needs (05/30/2023)  Utilities: Not At Risk (05/30/2023)  Depression (PHQ2-9): Low Risk  (06/29/2020)  Social Connections: Socially Isolated (05/30/2023)  Tobacco Use: Medium Risk (05/28/2023)    Readmission Risk Interventions    05/15/2023   11:59 AM 01/28/2022   11:32 AM  Readmission Risk Prevention Plan  Post Dischage Appt Complete Complete  Medication Screening Complete Complete  Transportation Screening Complete Complete

## 2023-05-30 NOTE — Progress Notes (Addendum)
 Progress Note    05/30/2023 6:47 AM 1 Day Post-Op  Subjective:  pt daughter at bedside translating. Pt not having pain currently.  She denies pain in her right foot.    Afebrile HR 40's-70's  110's-180's systolic 98%  Abx: vanc/zosyn  Vitals:   05/30/23 0430 05/30/23 0445  BP: 115/76   Pulse: (!) 56 (!) 52  Resp: 16 (!) 5  Temp:    SpO2: 97% 96%    Physical Exam: General:  no distress Lungs:  non labored Incisions:  right groin with vac in place with good seal.  Right leg incisions are bandaged and clean and dry.   Extremities:  right foot without motor or sensory   CBC    Component Value Date/Time   WBC 31.0 (H) 05/30/2023 0145   RBC 3.94 05/30/2023 0145   HGB 11.7 (L) 05/30/2023 0145   HGB 14.2 03/23/2021 0928   HCT 33.8 (L) 05/30/2023 0145   HCT 42.1 03/23/2021 0928   PLT 192 05/30/2023 0145   PLT 135 (L) 03/23/2021 0928   MCV 85.8 05/30/2023 0145   MCV 93 03/23/2021 0928   MCH 29.7 05/30/2023 0145   MCHC 34.6 05/30/2023 0145   RDW 15.6 (H) 05/30/2023 0145   RDW 12.0 03/23/2021 0928   LYMPHSABS 1.5 05/28/2023 2120   LYMPHSABS 1.9 03/23/2021 0928   MONOABS 0.9 05/28/2023 2120   EOSABS 0.0 05/28/2023 2120   EOSABS 0.3 03/23/2021 0928   BASOSABS 0.1 05/28/2023 2120   BASOSABS 0.0 03/23/2021 0928    BMET    Component Value Date/Time   NA 133 (L) 05/30/2023 0145   NA 138 01/15/2020 1128   K 3.4 (L) 05/30/2023 0145   CL 103 05/30/2023 0145   CO2 22 05/30/2023 0145   GLUCOSE 127 (H) 05/30/2023 0145   BUN 10 05/30/2023 0145   BUN 8 01/15/2020 1128   CREATININE 0.71 05/30/2023 0145   CALCIUM  7.6 (L) 05/30/2023 0145   GFRNONAA >60 05/30/2023 0145   GFRAA 80 01/15/2020 1128    INR    Component Value Date/Time   INR 1.5 (H) 05/29/2023 0030     Intake/Output Summary (Last 24 hours) at 05/30/2023 6578 Last data filed at 05/30/2023 0500 Gross per 24 hour  Intake 3557.15 ml  Output 1400 ml  Net 2157.15 ml      Assessment/Plan:  68 y.o.  female is s/p:  Ligation of the right axillobifemoral bypass graft limb Excision of the right common femoral artery Excision of right femoral to above-knee popliteal artery bypass with above-knee popliteal artery ligation Right sided greater saphenous vein saphenectomy Excision of the right femoral to below-knee popliteal artery bypass with greater saphenous vein patch   Femoral to above-knee popliteal bypass tunnel with 15 French drain Femoral to below-knee popliteal bypass tunnel with 19 French drain Groin with white and blue VAC sponge  1 Day Post-Op   -currently pain is controlled.  JP drain with minimal drainage.  Vac with good seal.  She is unable to wiggle toes on right foot and sensory is not in tact but currently without pain in the foot.   -most likely will require further surgery - possibly explant of ax bifem bypass. Increased risk of right leg amputation.  -leukocytosis with WBC of 31k (down from 34.2k two days ago); pt afebrile -palliative consulted for goals of care.  Meeting today with family -hgb improved after 3 units PRBC's -DVT prophylaxis:  sq heparin  -Dr. Rosalva Comber to see pt this morning with further  recommendations   Maryanna Smart, PA-C [facility] [phone] 05/30/2023 6:47 AM  VASCULAR STAFF ADDENDUM: I have independently interviewed and examined the patient. I agree with the above.  Overall [name] seems to be doing okay. The right leg is demarcating.  There are no signals in it Leukocytosis is elevated, not as high as on admission.  I think that some of this is due to ischemia I had a long conversation with her and her family regarding the options.  I think that the only option at this time is right above-knee amputation.  I am unsure as to optimal timing as I do not want her to get sick from the ischemia, but also wants to clear any infection prior to moving forward with above-knee amputation. The right groin needs to be washed out.  I  think that my plan with the proximal graft is to close layers over with tissue substitute. We discussed options including AKA versus palliative care.  She is elected to move forward with limb loss.  I have her scheduled for tomorrow for groin washout, AKA.  I will discuss this with my partners as Dr. Charlotte Cookey will be performing the procedure.  Kayla Part MD [facility] Phone Number: [phone] 05/30/2023 10:09 AM

## 2023-05-30 NOTE — Progress Notes (Signed)
 Palliative:  HPI: 68 y.o. female  with past medical history of PAD, critical limb ischemia s/p R femoral-popliteal artery bypass, HTN, HLD admitted on 05/28/2023 with RLE pain, swelling, purulent drainage and fevers with septic shock due to infected R fem-pop bypass. S/P graft excision 5/26. Anticipate further surgical interventions will be needed for potential further bypass graft resection and anticipation of need for amputation. Vascular surgery has concerns for AKA healing if pursued and recommend further discussions for goals of care conversations.    I met today at Katherine Archer bedside along with her granddaughter, Katherine Archer. We utilized tele-interpretor services to assist in our conversation. Katherine Archer had her mother, Katherine Archer, on call during part of our conversation. Katherine Archer voiced some concern for family that Katherine Archer wishes to be included in discussion. I personally spoke with Katherine Archer and she does share that she wishes her sister Katherine Archer to be her decision maker and that her daughter and granddaughter Katherine Archer and Katherine Archer may participate in conversations. She does not wish for us  to provide information or discuss with others although they are allowed to visit. Katherine Archer expresses desire to document her HCPOA and wishes - I provided her with Spanish Advance Directive packet.   We reviewed conversation with Katherine Archer  and plan moving forward. Katherine Archer confirms decision for R AKA acknowledging this may be a difficult road and will alter her current way of living. Family remain committed to care for her at home after hospitalization. Katherine Archer shares that she has peace with her decision. She does not have any concerns. She is hopeful for eventual return home as well as more time with her family. She has no further worries or concerns. She shares someone once told her "you cannot die from pain" so she reminds herself of this when she is going through difficult times and is able to calm her nerves and relax. She is willing  to experience some difficult times and discomfort for time with her family.   I did discuss with Katherine Archer her desire for resuscitation. Katherine Archer shares that she has a previous illness many years ago when she was resuscitated and expected to die. She reports she was in a comatose state for ~4 weeks. She is a very spiritual woman. She shares that she had an experience where Jesus shared that it is not yet her time. However, she is not afraid when her time comes. She shares that she does not desire resuscitative efforts. She trusts God to take her when her time comes. She believes God will keep her here if meant to be without resuscitation if it is His will. DNR is confirmed. Katherine Archer shares that she is not surprised this is her grandmother's wishes but acknowledges this is emotional to hear.   I attempted to call and review with daughter, Katherine Archer, but no answer. Voicemail is full.   All questions/concerns addressed. Emotional support provided.   Exam: Alert, oriented. No distress. Breathing regular, unlabored. Abd soft. RLE weakness and decreased sensation.   Plan: - DNR decided - Proceed with amputation - Daughter Katherine Archer would like to speak with [facility] about surgery  - Spiritual care consult for additional support and assistance to notarize ADP  60 min  Vila Grayer, NP Palliative Medicine Team Pager [phone] (Please see [email or web address] for schedule) Team Phone [phone]

## 2023-05-30 NOTE — Progress Notes (Signed)
 [facility] ADULT ICU REPLACEMENT PROTOCOL   The patient does apply for the [facility] Adult ICU Electrolyte Replacment Protocol based on the criteria listed below:   1.Exclusion criteria: TCTS, ECMO, Dialysis, and Myasthenia Gravis patients 2. Is GFR >/= 30 ml/min? Yes.    Patient's GFR today is >60 3. Is SCr </= 2? Yes.   Patient's SCr is 0.71 mg/dL 4. Did SCr increase >/= 0.5 in 24 hours? No. 5.Pt's weight >40kg  Yes.   6. Abnormal electrolyte(s): K+ 3.4  7. Electrolytes replaced per protocol 8.  Call MD STAT for K+ </= 2.5, Phos </= 1, or Mag </= 1 Physician:  Dr.Mohan  Katherine Archer 05/30/2023 4:52 AM

## 2023-05-30 NOTE — Progress Notes (Signed)
 Pt Arterial line removed due to recurrent bleeding at insertion site, despite dressing changes done by RT. E-Link nurse notified, AM labs drawn prior to removal of line.

## 2023-05-30 NOTE — Consult Note (Signed)
 [facility]    Date of Admission:  05/28/2023     Total days of antibiotics 3               Reason for Consult: Group A Strep Bacteremia   Referring Provider: Mose Arena  Primary Care Provider: Barbar Levine, MD   ASSESSMENT:  Mr. Katherine Archer is a 68 y/o AA female with history of peripheral artery disease s/p recent redo right femoral thrombectomy and right femoral to below-knee popliteal artery bypass graft admitted with fever and purulent drainage and found to have Group A Streptococcus bacteremia in the setting of post-surgical / graft infection. POD #1 with surgical specimens growing gram positive cocci on gram stain and culture pending and would suspect Group A Streptococcus. Vascular Surgery has planned for above knee amputation. Will change antibiotics to continuous penicillin G and add linezolid for anti-toxin effects. Monitor surgical specimens for organism identification. Recheck blood culture for clearance of bacteremia. Continue universal/standard precautions. Post-operative wound care per Vascular Surgery. Remaining medical and supportive care per CCM.   PLAN:  Change antibiotics to continuous penicillin G and add linezolid (anti-toxin) effect.  Post-surgical wound care and additional surgical intervention per Vascular Surgery Check blood culture for clearance of bacteremia Monitor culture for organism identification.  Standard/universal precautions.  Remaining medical and supportive care per Internal Medicine.    Principal Problem:   Septic shock (HCC) Active Problems:   Streptococcal bacteremia    sodium chloride    Intravenous Once   Chlorhexidine  Gluconate Cloth  6 each Topical Daily   heparin  injection (subcutaneous)  5,000 Units Subcutaneous Q8H   insulin aspart  0-9 Units Subcutaneous TID WC   irbesartan   75 mg Oral Daily   mupirocin ointment  1 Application Nasal BID   sodium chloride  flush  10-40 mL Intracatheter Q12H     HPI:  Katherine Archer is a 68 y.o. female with previous medical history of hypertension and peripheral artery disease s/p redo right common femerol endarterectomy in 2024 and thrombectomy in May 2025 presenting with worsening right lower extremity pain, swelling, and purulent drainage from the wound site.  Katherine Archer recently underwent redo right femoral thrombectomy and right femoral to below-knee popliteal artery bypass graft on 05/12/2023.  Began having fevers on 05/26/2023 with intermittent nausea and vomiting.  Febrile on admission with a temperature of 103.3 F with leukocytosis of 19,900.  Chest x-ray unremarkable.  CT angiography with peripheral enhancing fluid and gas surrounding the chronically occluded right profundofemoral artery stent graft concerning for infection/abscess with multiple tracts extending from the fluid collection to the medial skin surface.  Blood culture growing Streptococcus pyogenes.  Brought to the OR on 5/26 with surgical specimens showing gram-positive cocci on Gram stain and culture on incubated for better growth.  Initially started on vancomycin  and piperacillin/tazobactam for broad-spectrum coverage.  Ms. Druckenmiller has no feeling below her knee and denies any current pain. Grand-daughter at beside.   Review of Systems: Review of Systems  Constitutional:  Negative for chills, fever and weight loss.  Respiratory:  Negative for cough, shortness of breath and wheezing.   Cardiovascular:  Negative for chest pain and leg swelling.  Gastrointestinal:  Negative for abdominal pain, constipation, diarrhea, nausea and vomiting.  Skin:  Negative for rash.     Past Medical History:  Diagnosis Date   Cervical polyp 09/18/2018   Critical lower limb ischemia (HCC)    Encounter for screening mammogram for breast cancer 05/05/2016   Fibrocystic  breast changes of both breasts 04/29/2015   Hyperlipidemia    Hypertension    PAD (peripheral artery disease) (HCC) 08/23/2019   Stroke (HCC) 08/23/2019     Social History   Tobacco Use   Smoking status: Former    Current packs/day: 0.00    Types: Cigarettes    Quit date: 10/04/2019    Years since quitting: 3.6    Passive exposure: Never   Smokeless tobacco: Never  Vaping Use   Vaping status: Never Used  Substance Use Topics   Alcohol  use: No   Drug use: No    Family History  Problem Relation Age of Onset   Heart Problems Father    Heart Problems Brother    Heart Problems Brother     Allergies  Allergen Reactions   Amoxicillin Other (See Comments)    Skin infection on face   Penicillins Other (See Comments)    Shaky and Dizziness 20 years ago    OBJECTIVE: Blood pressure (!) 116/52, pulse (!) 59, temperature (!) 97.1 F (36.2 C), temperature source Axillary, resp. rate 14, height 5\' 6"  (1.676 m), weight 61.1 kg, SpO2 98%.  Physical Exam Constitutional:      General: She is not in acute distress.    Appearance: She is well-developed.  Cardiovascular:     Rate and Rhythm: Normal rate and regular rhythm.     Heart sounds: Normal heart sounds.     Comments: Right lower extremity with decreased sensation below the knee and temperature of distal extremity is cool to the touch compared to the contralateral side.  Pulmonary:     Effort: Pulmonary effort is normal.     Breath sounds: Normal breath sounds.  Skin:    General: Skin is warm and dry.  Neurological:     Mental Status: She is alert and oriented to person, place, and time.     Lab Results Lab Results  Component Value Date   WBC 31.0 (H) 05/30/2023   HGB 11.7 (L) 05/30/2023   HCT 33.8 (L) 05/30/2023   MCV 85.8 05/30/2023   PLT 192 05/30/2023    Lab Results  Component Value Date   CREATININE 0.72 05/30/2023   BUN 10 05/30/2023   NA 132 (L) 05/30/2023   K 3.7 05/30/2023   CL 97 (L) 05/30/2023   CO2 24 05/30/2023    Lab Results  Component Value Date   ALT 124 (H) 05/28/2023   AST 209 (H) 05/28/2023   ALKPHOS 80 05/28/2023   BILITOT 1.3 (H)  05/28/2023     Microbiology: Recent Results (from the past 240 hours)  Resp panel by RT-PCR (RSV, Flu A&B, Covid) Anterior Nasal Swab     Status: None   Collection Time: 05/28/23  9:27 PM   Specimen: Anterior Nasal Swab  Result Value Ref Range Status   SARS Coronavirus 2 by RT PCR NEGATIVE NEGATIVE Final   Influenza A by PCR NEGATIVE NEGATIVE Final   Influenza B by PCR NEGATIVE NEGATIVE Final    Comment: (NOTE) The Xpert Xpress SARS-CoV-2/FLU/RSV plus assay is intended as an aid in the diagnosis of influenza from Nasopharyngeal swab specimens and should not be used as a sole basis for treatment. Nasal washings and aspirates are unacceptable for Xpert Xpress SARS-CoV-2/FLU/RSV testing.  Fact Sheet for Patients: BloggerCourse.com  Fact Sheet for Healthcare Providers: SeriousBroker.it  This test is not yet approved or cleared by the [place]  FDA and has been authorized for detection and/or diagnosis of SARS-CoV-2 by FDA  under an Emergency Use Authorization (EUA). This EUA will remain in effect (meaning this test can be used) for the duration of the COVID-19 declaration under Section 564(b)(1) of the Act, 21 U.S.C. section 360bbb-3(b)(1), unless the authorization is terminated or revoked.     Resp Syncytial Virus by PCR NEGATIVE NEGATIVE Final    Comment: (NOTE) Fact Sheet for Patients: BloggerCourse.com  Fact Sheet for Healthcare Providers: SeriousBroker.it  This test is not yet approved or cleared by the [place]  FDA and has been authorized for detection and/or diagnosis of SARS-CoV-2 by FDA under an Emergency Use Authorization (EUA). This EUA will remain in effect (meaning this test can be used) for the duration of the COVID-19 declaration under Section 564(b)(1) of the Act, 21 U.S.C. section 360bbb-3(b)(1), unless the authorization is terminated  or revoked.  Performed at [facility], [address] [place], [place] [address]   Blood Culture (routine x 2)     Status: None (Preliminary result)   Collection Time: 05/28/23  9:27 PM   Specimen: BLOOD RIGHT ARM  Result Value Ref Range Status   Specimen Description BLOOD RIGHT ARM  Final   Special Requests   Final    BOTTLES DRAWN AEROBIC ONLY Blood Culture results may not be optimal due to an inadequate volume of blood received in culture bottles   Culture  Setup Time   Final    GRAM POSITIVE COCCI IN CHAINS BOTTLES DRAWN AEROBIC ONLY CRITICAL RESULT CALLED TO, READ BACK BY AND VERIFIED WITH: Arby Beam [id number] @ 2045 FH  Performed at [facility], [address], [place], [place] [address]    Culture GRAM POSITIVE COCCI IN CHAINS  Final   Report Status PENDING  Incomplete  Blood Culture ID Panel (Reflexed)     Status: Abnormal   Collection Time: 05/28/23  9:27 PM  Result Value Ref Range Status   Enterococcus faecalis NOT DETECTED NOT DETECTED Final   Enterococcus Faecium NOT DETECTED NOT DETECTED Final   Listeria monocytogenes NOT DETECTED NOT DETECTED Final   Staphylococcus species NOT DETECTED NOT DETECTED Final   Staphylococcus aureus (BCID) NOT DETECTED NOT DETECTED Final   Staphylococcus epidermidis NOT DETECTED NOT DETECTED Final   Staphylococcus lugdunensis NOT DETECTED NOT DETECTED Final   Streptococcus species DETECTED (A) NOT DETECTED Final    Comment: CRITICAL RESULT CALLED TO, READ BACK BY AND VERIFIED WITH: PHARMD ASerena Dana [id number] @ 2045 FH     Streptococcus agalactiae NOT DETECTED NOT DETECTED Final   Streptococcus pneumoniae NOT DETECTED NOT DETECTED Final   Streptococcus pyogenes DETECTED (A) NOT DETECTED Final    Comment: CRITICAL RESULT CALLED TO, READ BACK BY AND VERIFIED WITH: PHARMD ASerena Dana [id number] @ 2045 FH     A.calcoaceticus-baumannii NOT DETECTED NOT DETECTED Final   Bacteroides fragilis NOT DETECTED NOT DETECTED Final    Enterobacterales NOT DETECTED NOT DETECTED Final   Enterobacter cloacae complex NOT DETECTED NOT DETECTED Final   Escherichia coli NOT DETECTED NOT DETECTED Final   Klebsiella aerogenes NOT DETECTED NOT DETECTED Final   Klebsiella oxytoca NOT DETECTED NOT DETECTED Final   Klebsiella pneumoniae NOT DETECTED NOT DETECTED Final   Proteus species NOT DETECTED NOT DETECTED Final   Salmonella species NOT DETECTED NOT DETECTED Final   Serratia marcescens NOT DETECTED NOT DETECTED Final   Haemophilus influenzae NOT DETECTED NOT DETECTED Final   Neisseria meningitidis NOT DETECTED NOT DETECTED Final   Pseudomonas aeruginosa NOT DETECTED NOT DETECTED Final  Stenotrophomonas maltophilia NOT DETECTED NOT DETECTED Final   Candida albicans NOT DETECTED NOT DETECTED Final   Candida auris NOT DETECTED NOT DETECTED Final   Candida glabrata NOT DETECTED NOT DETECTED Final   Candida krusei NOT DETECTED NOT DETECTED Final   Candida parapsilosis NOT DETECTED NOT DETECTED Final   Candida tropicalis NOT DETECTED NOT DETECTED Final   Cryptococcus neoformans/gattii NOT DETECTED NOT DETECTED Final    Comment: Performed at [facility], [address], [place], [place] [address]  Blood Culture (routine x 2)     Status: None (Preliminary result)   Collection Time: 05/28/23 10:46 PM   Specimen: BLOOD  Result Value Ref Range Status   Specimen Description BLOOD BLOOD LEFT ARM  Final   Special Requests   Final    AEROBIC BOTTLE ONLY Blood Culture results may not be optimal due to an inadequate volume of blood received in culture bottles   Culture   Final    NO GROWTH 2 DAYS Performed at [facility], [address] [place], [place] [address]    Report Status PENDING  Incomplete  Surgical PCR screen     Status: None   Collection Time: 05/29/23  2:55 AM   Specimen: Nasal Mucosa; Nasal Swab  Result Value Ref Range Status   MRSA, PCR NEGATIVE NEGATIVE Final   Staphylococcus aureus NEGATIVE NEGATIVE  Final    Comment: (NOTE) The Xpert SA Assay (FDA approved for NASAL specimens in patients 62 years of age and older), is one component of a comprehensive surveillance program. It is not intended to diagnose infection nor to guide or monitor treatment. Performed at [facility], [address], [place], [place] [address]   Aerobic/Anaerobic Culture w Gram Stain (surgical/deep wound)     Status: None (Preliminary result)   Collection Time: 05/29/23  9:11 AM   Specimen: Groin, Right; Abscess  Result Value Ref Range Status   Specimen Description ABSCESS  Final   Special Requests RIGHT GROIN  Final   Gram Stain   Final    WBC PRESENT, PREDOMINANTLY MONONUCLEAR RARE GRAM POSITIVE COCCI IN PAIRS    Culture   Final    CULTURE REINCUBATED FOR BETTER GROWTH Performed at [facility], [address], [place], [place] [address]    Report Status PENDING  Incomplete  MRSA Next Gen by PCR, Nasal     Status: None   Collection Time: 05/29/23 12:21 PM   Specimen: Nasal Mucosa; Nasal Swab  Result Value Ref Range Status   MRSA by PCR Next Gen NOT DETECTED NOT DETECTED Final    Comment: (NOTE) The GeneXpert MRSA Assay (FDA approved for NASAL specimens only), is one component of a comprehensive MRSA colonization surveillance program. It is not intended to diagnose MRSA infection nor to guide or monitor treatment for MRSA infections. Test performance is not FDA approved in patients less than 81 years old. Performed at [facility], [address], [place], [place] [address]      Marlan Silva, NP [facility]  05/30/2023  9:31 AM

## 2023-05-30 NOTE — Plan of Care (Signed)
  Problem: Clinical Measurements: Goal: Respiratory complications will improve Outcome: Progressing   Problem: Nutrition: Goal: Adequate nutrition will be maintained Outcome: Progressing   Problem: Coping: Goal: Level of anxiety will decrease Outcome: Progressing   Problem: Elimination: Goal: Will not experience complications related to urinary retention Outcome: Progressing   Problem: Pain Managment: Goal: General experience of comfort will improve and/or be controlled Outcome: Progressing   Problem: Skin Integrity: Goal: Risk for impaired skin integrity will decrease Outcome: Progressing

## 2023-05-30 NOTE — Progress Notes (Signed)
   NAME:  Tiziana Cislo, MRN:  [id number], DOB:  25-Aug-1955, LOS: 1 ADMISSION DATE:  05/28/2023, CONSULTATION DATE:  5/26 REFERRING MD:  EDP, CHIEF COMPLAINT:  RLE pain and swelling   History of Present Illness:  68 yo female with extensive vascular related pmh presents today with worsening RLE pain and swelling. She also endorses dizziness and inability to ambulate on R leg 2/2 pain. Daughter is at bedside and states that pt had a fem-pop bypass with redo and recently worsening swelling. She was also having purulent drainage from the wound site. She has been having fevers up to 106 at home per family. She also is having chills. Nausea no vomiting. No syncope but near syncope.   Upon arrival her R LE is considerably more swollen than Left. She has multiple open sites at the graft site with drainage actively leaking. R calf has been wrapped by vascular after eval. They are planning for debridement in OR in am. She was also noted to have hgb of 4.4, hypotensive on levo, temp >104. Denies numbness  Ccm has been asked to admit  Pertinent  Medical History  Htn Pad Chronic eliquis  use Hyperlipidemia H/o cva 2021  Significant Hospital Events: Including procedures, antibiotic start and stop dates in addition to other pertinent events   Admitted to icu 5/26  Interim History / Subjective:  No events, pain controlled with PRNs.  HD stable  Objective    Blood pressure (!) 105/51, pulse (!) 54, temperature (!) 97.1 F (36.2 C), temperature source Axillary, resp. rate 15, height 5\' 6"  (1.676 m), weight 61.1 kg, SpO2 99%.        Intake/Output Summary (Last 24 hours) at 05/30/2023 1211 Last data filed at 05/30/2023 4098 Gross per 24 hour  Intake 1151.1 ml  Output 565 ml  Net 586.1 ml   Filed Weights   05/28/23 2034 05/29/23 0500 05/30/23 0600  Weight: 56.7 kg 57.7 kg 61.1 kg    Examination: No distress R leg the same, no pulses minimal drain output Lungs sound okay Heart sounds  regular Oriented  WBC up  Resolved problem list   Assessment and Plan  Septic shock 2/2 infected R fem pop bypass w/ strep pyogenes post washout and excision 5/26, leg unfortunately not viable Acute anemia without overt bleeding- stable Leukocytosis- up postop H/o htn Hyperlipidemia  - ID to see, appreciate help - Pain control as ordered - Palliative help appreciated - Tentative plan for AKA tomorrow depending on GOC - Keep in ICU as will likely need pretty aggressive resuscitation postop  Best Practice (right click and "Reselect all SmartList Selections" daily)   Diet/type: NPO MN DVT prophylaxis heparin  subQ Pressure ulcer(s): present on admission  GI prophylaxis: PPI Lines: Central line Foley:  Yes, and it is still needed Code Status:  full code Last date of multidisciplinary goals of care discussion [d/w daughter and pt at bedside 5/27 with translator]  Ardelle Kos MD PCCM

## 2023-05-31 ENCOUNTER — Inpatient Hospital Stay (HOSPITAL_COMMUNITY): Admitting: Anesthesiology

## 2023-05-31 ENCOUNTER — Encounter (HOSPITAL_COMMUNITY): Payer: Self-pay | Admitting: Critical Care Medicine

## 2023-05-31 ENCOUNTER — Encounter (HOSPITAL_COMMUNITY)
Admission: EM | Disposition: A | Discharge status: Rehabilitation Facility | Payer: Self-pay | Source: Home / Self Care | Attending: Family Medicine

## 2023-05-31 ENCOUNTER — Other Ambulatory Visit: Payer: Self-pay

## 2023-05-31 DIAGNOSIS — I2510 Atherosclerotic heart disease of native coronary artery without angina pectoris: Secondary | ICD-10-CM | POA: Diagnosis not present

## 2023-05-31 DIAGNOSIS — I998 Other disorder of circulatory system: Secondary | ICD-10-CM

## 2023-05-31 DIAGNOSIS — E782 Mixed hyperlipidemia: Secondary | ICD-10-CM | POA: Diagnosis not present

## 2023-05-31 DIAGNOSIS — Z9889 Other specified postprocedural states: Secondary | ICD-10-CM

## 2023-05-31 DIAGNOSIS — I10 Essential (primary) hypertension: Secondary | ICD-10-CM | POA: Diagnosis not present

## 2023-05-31 HISTORY — PX: INCISION AND DRAINAGE OF WOUND: SHX1803

## 2023-05-31 HISTORY — PX: AMPUTATION: SHX166

## 2023-05-31 HISTORY — PX: APPLICATION OF WOUND VAC: SHX5189

## 2023-05-31 LAB — CULTURE, BLOOD (ROUTINE X 2)

## 2023-05-31 LAB — RENAL FUNCTION PANEL
Albumin: 1.6 g/dL — ABNORMAL LOW (ref 3.5–5.0)
Anion gap: 5 (ref 5–15)
BUN: 8 mg/dL (ref 8–23)
CO2: 24 mmol/L (ref 22–32)
Calcium: 8 mg/dL — ABNORMAL LOW (ref 8.9–10.3)
Chloride: 99 mmol/L (ref 98–111)
Creatinine, Ser: 0.71 mg/dL (ref 0.44–1.00)
GFR, Estimated: >60 mL/min (ref >60)
Glucose, Bld: 101 mg/dL — ABNORMAL HIGH (ref 70–99)
Phosphorus: 1.6 mg/dL — ABNORMAL LOW (ref 2.5–4.6)
Potassium: 4.5 mmol/L (ref 3.5–5.1)
Sodium: 128 mmol/L — ABNORMAL LOW (ref 135–145)

## 2023-05-31 LAB — GLUCOSE, CAPILLARY
Glucose-Capillary: 120 mg/dL — ABNORMAL HIGH (ref 70–99)
Glucose-Capillary: 112 mg/dL — ABNORMAL HIGH (ref 70–99)
Glucose-Capillary: 148 mg/dL — ABNORMAL HIGH (ref 70–99)

## 2023-05-31 LAB — PHOSPHORUS: Phosphorus: 3.9 mg/dL (ref 2.5–4.6)

## 2023-05-31 SURGERY — AMPUTATION, ABOVE KNEE
Anesthesia: General | Site: Leg Upper | Laterality: Right

## 2023-05-31 MED ORDER — LACTATED RINGERS IV SOLN: INTRAVENOUS | Status: DC

## 2023-05-31 MED ORDER — 0.9 % SODIUM CHLORIDE (POUR BTL) OPTIME
TOPICAL | Status: DC | PRN
Start: 2023-05-31 — End: 2023-05-31
  Administered 2023-05-31: 1000 mL

## 2023-05-31 MED ORDER — PROPOFOL 10 MG/ML IV BOLUS
INTRAVENOUS | Status: DC | PRN
  Administered 2023-05-31: 80 mg via INTRAVENOUS

## 2023-05-31 MED ORDER — SODIUM CHLORIDE 0.9 % IR SOLN
Status: DC | PRN
Start: 2023-05-31 — End: 2023-05-31
  Administered 2023-05-31: 1

## 2023-05-31 MED ORDER — ONDANSETRON HCL 4 MG/2ML IJ SOLN: 4.0000 mg | Freq: Once | INTRAMUSCULAR | Status: DC | PRN

## 2023-05-31 MED ORDER — HYDROMORPHONE HCL 1 MG/ML IJ SOLN
INTRAMUSCULAR | Status: DC | PRN
  Administered 2023-05-31: .5 mg via INTRAVENOUS

## 2023-05-31 MED ORDER — FENTANYL CITRATE (PF) 100 MCG/2ML IJ SOLN
INTRAMUSCULAR | Status: AC
Start: 2023-05-31 — End: ?
  Filled 2023-05-31: qty 2

## 2023-05-31 MED ORDER — FENTANYL CITRATE (PF) 100 MCG/2ML IJ SOLN
25.0000 ug | INTRAMUSCULAR | Status: DC | PRN
  Administered 2023-05-31 (×2): 50 ug via INTRAVENOUS

## 2023-05-31 MED ORDER — KETAMINE HCL 10 MG/ML IJ SOLN
INTRAMUSCULAR | Status: DC | PRN
  Administered 2023-05-31: 10 mg via INTRAVENOUS
  Administered 2023-05-31: 20 mg via INTRAVENOUS
  Administered 2023-05-31: 10 mg via INTRAVENOUS

## 2023-05-31 MED ORDER — KETAMINE HCL 50 MG/5ML IJ SOSY
PREFILLED_SYRINGE | INTRAMUSCULAR | Status: AC
  Filled 2023-05-31: qty 5

## 2023-05-31 MED ORDER — SODIUM PHOSPHATES 45 MMOLE/15ML IV SOLN
45.0000 mmol | Freq: Once | INTRAVENOUS | Status: AC
  Administered 2023-05-31: 45 mmol via INTRAVENOUS
  Filled 2023-05-31: qty 15

## 2023-05-31 MED ORDER — CHLORHEXIDINE GLUCONATE 0.12 % MT SOLN
OROMUCOSAL | Status: AC
  Administered 2023-05-31: 15 mL via OROMUCOSAL
  Filled 2023-05-31: qty 15

## 2023-05-31 MED ORDER — SUGAMMADEX SODIUM 200 MG/2ML IV SOLN
INTRAVENOUS | Status: DC | PRN
  Administered 2023-05-31: 123.8 mg via INTRAVENOUS

## 2023-05-31 MED ORDER — ROCURONIUM BROMIDE 10 MG/ML (PF) SYRINGE
PREFILLED_SYRINGE | INTRAVENOUS | Status: DC | PRN
  Administered 2023-05-31: 50 mg via INTRAVENOUS

## 2023-05-31 MED ORDER — FENTANYL CITRATE (PF) 250 MCG/5ML IJ SOLN
INTRAMUSCULAR | Status: DC | PRN
  Administered 2023-05-31 (×3): 50 ug via INTRAVENOUS

## 2023-05-31 MED ORDER — FENTANYL CITRATE (PF) 250 MCG/5ML IJ SOLN
INTRAMUSCULAR | Status: AC
Start: 2023-05-31 — End: ?
  Filled 2023-05-31: qty 5

## 2023-05-31 MED ORDER — PROPOFOL 10 MG/ML IV BOLUS
INTRAVENOUS | Status: AC
  Filled 2023-05-31: qty 20

## 2023-05-31 MED ORDER — ACETAMINOPHEN 500 MG PO TABS: 1000.0000 mg | ORAL_TABLET | Freq: Once | ORAL | Status: AC

## 2023-05-31 MED ORDER — VANCOMYCIN HCL 1000 MG IV SOLR
INTRAVENOUS | Status: AC
Start: 2023-05-31 — End: ?
  Filled 2023-05-31: qty 20

## 2023-05-31 MED ORDER — VANCOMYCIN HCL 1000 MG IV SOLR: Status: DC | PRN

## 2023-05-31 MED ORDER — BACITRACIN ZINC 500 UNIT/GM EX OINT
TOPICAL_OINTMENT | CUTANEOUS | Status: AC
  Filled 2023-05-31: qty 28.35

## 2023-05-31 MED ORDER — ACETAMINOPHEN 500 MG PO TABS
ORAL_TABLET | ORAL | Status: AC
  Administered 2023-05-31: 1000 mg
  Filled 2023-05-31: qty 2

## 2023-05-31 MED ORDER — ONDANSETRON HCL 4 MG/2ML IJ SOLN
INTRAMUSCULAR | Status: DC | PRN
  Administered 2023-05-31: 4 mg via INTRAVENOUS

## 2023-05-31 MED ORDER — OXYCODONE HCL 5 MG PO TABS: 5.0000 mg | ORAL_TABLET | Freq: Once | ORAL | Status: DC | PRN

## 2023-05-31 MED ORDER — OXYCODONE HCL 5 MG/5ML PO SOLN: 5.0000 mg | Freq: Once | ORAL | Status: DC | PRN

## 2023-05-31 MED ORDER — ORAL CARE MOUTH RINSE: 15.0000 mL | Freq: Once | OROMUCOSAL | Status: AC

## 2023-05-31 MED ORDER — CHLORHEXIDINE GLUCONATE 0.12 % MT SOLN: 15.0000 mL | Freq: Once | OROMUCOSAL | Status: AC

## 2023-05-31 MED ORDER — HYDROMORPHONE HCL 1 MG/ML IJ SOLN
INTRAMUSCULAR | Status: AC
  Filled 2023-05-31: qty 0.5

## 2023-05-31 MED ORDER — DEXAMETHASONE SODIUM PHOSPHATE 10 MG/ML IJ SOLN
INTRAMUSCULAR | Status: DC | PRN
  Administered 2023-05-31: 10 mg via INTRAVENOUS

## 2023-05-31 MED ORDER — GENTAMICIN SULFATE 40 MG/ML IJ SOLN
INTRAMUSCULAR | Status: AC
Start: 2023-05-31 — End: ?
  Filled 2023-05-31: qty 6

## 2023-05-31 SURGICAL SUPPLY — 63 items
BAG COUNTER SPONGE SURGICOUNT (BAG) ×3 IMPLANT
BLADE SAW GIGLI 510 (BLADE) ×3 IMPLANT
BNDG COHESIVE 6X5 TAN ST LF (GAUZE/BANDAGES/DRESSINGS) ×3 IMPLANT
BNDG ELASTIC 4INX 5YD STR LF (GAUZE/BANDAGES/DRESSINGS) IMPLANT
BNDG ELASTIC 4X5.8 VLCR STR LF (GAUZE/BANDAGES/DRESSINGS) ×3 IMPLANT
BNDG ELASTIC 6INX 5YD STR LF (GAUZE/BANDAGES/DRESSINGS) ×3 IMPLANT
BNDG GAUZE DERMACEA FLUFF 4 (GAUZE/BANDAGES/DRESSINGS) ×6 IMPLANT
BUR DISC 0.8X25 (BURR) IMPLANT
CANISTER SUCTION 3000ML PPV (SUCTIONS) ×3 IMPLANT
CLIP TI MEDIUM 6 (CLIP) ×3 IMPLANT
CLIP TI WIDE RED SMALL 6 (CLIP) ×3 IMPLANT
CNTNR URN SCR LID CUP LEK RST (MISCELLANEOUS) ×3 IMPLANT
COVER SURGICAL LIGHT HANDLE (MISCELLANEOUS) ×3 IMPLANT
DRAIN CHANNEL 19F RND (DRAIN) IMPLANT
DRAPE DERMATAC (DRAPES) IMPLANT
DRAPE HALF SHEET 40X57 (DRAPES) ×3 IMPLANT
DRAPE INCISE IOBAN 66X45 STRL (DRAPES) IMPLANT
DRAPE SURG ORHT 6 SPLT 77X108 (DRAPES) ×6 IMPLANT
DRAPE U-SHAPE 76X120 STRL (DRAPES) IMPLANT
DRESSING VERAFLO CLEANS CC MED (GAUZE/BANDAGES/DRESSINGS) IMPLANT
DRSG ADAPTIC 3X8 NADH LF (GAUZE/BANDAGES/DRESSINGS) ×3 IMPLANT
ELECT CAUTERY BLADE 6.4 (BLADE) ×3 IMPLANT
ELECTRODE REM PT RTRN 9FT ADLT (ELECTROSURGICAL) ×3 IMPLANT
EVACUATOR SILICONE 100CC (DRAIN) IMPLANT
GAUZE 4X4 16PLY ~~LOC~~+RFID DBL (SPONGE) ×3 IMPLANT
GAUZE SPONGE 4X4 12PLY STRL (GAUZE/BANDAGES/DRESSINGS) ×6 IMPLANT
GAUZE XEROFORM 5X9 LF (GAUZE/BANDAGES/DRESSINGS) IMPLANT
GLOVE SURG SS PI 7.5 STRL IVOR (GLOVE) ×9 IMPLANT
GOWN STRL REUS W/ TWL LRG LVL3 (GOWN DISPOSABLE) ×6 IMPLANT
GOWN STRL REUS W/ TWL XL LVL3 (GOWN DISPOSABLE) ×3 IMPLANT
GRAFT SKIN WND MICRO 38 (Tissue) IMPLANT
GRAFT SKIN WND SURGIBIND 3X7 (Tissue) IMPLANT
IV NS IRRIG 3000ML ARTHROMATIC (IV SOLUTION) ×3 IMPLANT
KIT BASIN OR (CUSTOM PROCEDURE TRAY) ×3 IMPLANT
KIT STIMULAN RAPID CURE 5CC (Orthopedic Implant) IMPLANT
KIT TURNOVER KIT B (KITS) ×3 IMPLANT
NDL HYPO 22X1.5 SAFETY MO (MISCELLANEOUS) IMPLANT
NDL HYPO 25GX1X1/2 BEV (NEEDLE) ×3 IMPLANT
NEEDLE HYPO 22X1.5 SAFETY MO (MISCELLANEOUS) ×3 IMPLANT
NEEDLE HYPO 25GX1X1/2 BEV (NEEDLE) ×3 IMPLANT
NS IRRIG 1000ML POUR BTL (IV SOLUTION) ×3 IMPLANT
PACK CV ACCESS (CUSTOM PROCEDURE TRAY) IMPLANT
PACK GENERAL/GYN (CUSTOM PROCEDURE TRAY) ×3 IMPLANT
PACK UNIVERSAL I (CUSTOM PROCEDURE TRAY) ×3 IMPLANT
PAD ARMBOARD POSITIONER FOAM (MISCELLANEOUS) ×6 IMPLANT
PAD NEG PRESSURE SENSATRAC (MISCELLANEOUS) IMPLANT
RASP HELIOCORDIAL MED (MISCELLANEOUS) IMPLANT
SET HNDPC FAN SPRY TIP SCT (DISPOSABLE) IMPLANT
STAPLER SKIN PROX 35W (STAPLE) ×3 IMPLANT
STOCKINETTE IMPERVIOUS LG (DRAPES) ×3 IMPLANT
SUT ETHILON 3 0 PS 1 (SUTURE) IMPLANT
SUT SILK 0 TIES 10X30 (SUTURE) ×3 IMPLANT
SUT SILK 2-0 18XBRD TIE 12 (SUTURE) IMPLANT
SUT SILK 3-0 18XBRD TIE 12 (SUTURE) IMPLANT
SUT VIC AB 2-0 CT1 18 (SUTURE) ×6 IMPLANT
SUT VIC AB 2-0 CTX 36 (SUTURE) IMPLANT
SUT VIC AB 3-0 SH 27X BRD (SUTURE) IMPLANT
SUT VIC AB 4-0 PS2 18 (SUTURE) IMPLANT
SYR CONTROL 10ML LL (SYRINGE) ×3 IMPLANT
TIP FAN IRRIG PULSAVAC PLUS (DISPOSABLE) IMPLANT
TOWEL GREEN STERILE (TOWEL DISPOSABLE) ×3 IMPLANT
UNDERPAD 30X36 HEAVY ABSORB (UNDERPADS AND DIAPERS) ×3 IMPLANT
WATER STERILE IRR 1000ML POUR (IV SOLUTION) ×3 IMPLANT

## 2023-05-31 NOTE — Op Note (Signed)
    Patient name: Mikayah Joy MRN: [id number] DOB: 29-Jul-1955 Sex: female  05/31/2023 Pre-operative Diagnosis: Right groin wound, right leg ischemia Post-operative diagnosis:  Same Surgeon:  Gareld June Assistants:  Maryanna Smart, PA Procedure:   #1: I&D right groin wound    #2: Sartorius muscle flap, right groin   #3: Placement of first 38 cm skin substitute (Kerecis)   #4: Placement of antibiotic impregnated beads   5: Wound VAC, right groin (10 x 6 x 2)   #6: Right above-knee amputation Anesthesia:  General Blood Loss:  100 cc Specimens:  none  Findings: Right axillofemoral, right to left femorofemoral graft fully exposed.  The wound was irrigated with 3 L of saline with Pulsavac followed by Vashe solution.  I then rotated the sartorius muscle flap over top of the exposed graft.  The graft was packed with for skin and for skin powder with antibiotic beads.  I was able to close the subcutaneous tissue over top of the wound and placed a blue sponge.  Right above-knee amputation was performed.  80% of the muscle was very healthy.  Marginal muscle on the posterior flap  Indications: This is a 68 year old female who recently underwent excision of a infected right femoral-popliteal bypass graft.  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  Procedure:  The patient was identified in the holding area and taken to [facility] OR ROOM 16  The patient was then placed supine on the table. general anesthesia was administered.  The patient was prepped and draped in the usual sterile fashion.  A time out was called and antibiotics were administered.  A PA was necessary to explain the procedure and assist with technical details.  She helped with exposure by providing suction and retraction.  She helped with the amputation and dressing placement.  The right groin wound was irrigated using the Pulsavac and 3 L of saline.  I then used 1 bottle of Vashe solution.  I then mobilized the sartorius  muscle and divided it proximally.  I packed the visualized dacryon graft from the ax bifemoral graft with for skin and then sutured the sartorius muscle flap over top of this.  For skin powder and vancomycin  and gentamicin impregnated beads were placed in the wound.  I then used 2-0 Vicryl to reapproximate more the soft tissue over top of the graft and then I placed a blue sponge and connected to the suction.  A fishmouth incision was made in the mid right thigh with a 10 blade.  Cautery was used divide subcutaneous tissue down to the femur which was circumferentially exposed and then transected with a Gigli saw.  The anterior surface was beveled and a rasp was used to smooth the bone surface.  I then identified the neurovascular bundle and ligated this proximal to the cut edge of the femur.  The anterior muscle all appeared very healthy.  There were some marginal areas of muscle on the posterior flap but did appear to be viable.  The wound was then copiously irrigated.  Hemostasis was achieved.  The fascia was then reapproximated with interrupted 2-0 Vicryl and the skin was closed with staples.  Sterile dressings were applied.   Disposition: To PACU stable.   Reinaldo Caras, M.D., [facility] [place] Office: [phone] Pager:  [phone]

## 2023-05-31 NOTE — Progress Notes (Signed)
 Pt. Underwent surgery for Leg amputation. Post-op status is table. Pg. Is alert and in good spirits. Offered emotional and support and prayer.

## 2023-05-31 NOTE — H&P (View-Only) (Signed)
  Progress Note    05/31/2023 7:47 AM 2 Days Post-Op  Subjective:  resting comfortably.  No questions.  afebrile  Vitals:   05/31/23 0700 05/31/23 0739  BP: (!) 113/58   Pulse: (!) 58   Resp: 19   Temp:  98.2 F (36.8 C)  SpO2: 94%     Physical Exam: General:  no distress Cardiac:  regular Lungs:  non labored Incisions:  bandages to lower leg in tact   CBC    Component Value Date/Time   WBC 31.0 (H) 05/30/2023 0145   RBC 3.94 05/30/2023 0145   HGB 11.7 (L) 05/30/2023 0145   HGB 14.2 03/23/2021 0928   HCT 33.8 (L) 05/30/2023 0145   HCT 42.1 03/23/2021 0928   PLT 192 05/30/2023 0145   PLT 135 (L) 03/23/2021 0928   MCV 85.8 05/30/2023 0145   MCV 93 03/23/2021 0928   MCH 29.7 05/30/2023 0145   MCHC 34.6 05/30/2023 0145   RDW 15.6 (H) 05/30/2023 0145   RDW 12.0 03/23/2021 0928   LYMPHSABS 1.5 05/28/2023 2120   LYMPHSABS 1.9 03/23/2021 0928   MONOABS 0.9 05/28/2023 2120   EOSABS 0.0 05/28/2023 2120   EOSABS 0.3 03/23/2021 0928   BASOSABS 0.1 05/28/2023 2120   BASOSABS 0.0 03/23/2021 0928    BMET    Component Value Date/Time   NA 128 (L) 05/31/2023 0425   NA 138 01/15/2020 1128   K 4.5 05/31/2023 0425   CL 99 05/31/2023 0425   CO2 24 05/31/2023 0425   GLUCOSE 101 (H) 05/31/2023 0425   BUN 8 05/31/2023 0425   BUN 8 01/15/2020 1128   CREATININE 0.71 05/31/2023 0425   CALCIUM  8.0 (L) 05/31/2023 0425   GFRNONAA >60 05/31/2023 0425   GFRAA 80 01/15/2020 1128    INR    Component Value Date/Time   INR 1.5 (H) 05/29/2023 0030     Intake/Output Summary (Last 24 hours) at 05/31/2023 0747 Last data filed at 05/31/2023 0700 Gross per 24 hour  Intake 1373.32 ml  Output 2010 ml  Net -636.68 ml      Assessment/Plan:  68 y.o. female is s/p:  Ligation of the right axillobifemoral bypass graft limb Excision of the right common femoral artery Excision of right femoral to above-knee popliteal artery bypass with above-knee popliteal artery ligation Right  sided greater saphenous vein saphenectomy Excision of the right femoral to below-knee popliteal artery bypass with greater saphenous vein patch   Femoral to above-knee popliteal bypass tunnel with 15 French drain Femoral to below-knee popliteal bypass tunnel with 19 French drain Groin with white and blue VAC sponge   2 Days Post-Op   -plan for OR today for right groin washout and right above knee amputation -continue npo -DVT prophylaxis:  sq heparin    Maryanna Smart, PA-C [facility] [phone] 05/31/2023 7:47 AM

## 2023-05-31 NOTE — Progress Notes (Signed)
 [facility] ADULT ICU REPLACEMENT PROTOCOL   The patient does apply for the [facility] Adult ICU Electrolyte Replacment Protocol based on the criteria listed below:   1.Exclusion criteria: TCTS, ECMO, Dialysis, and Myasthenia Gravis patients 2. Is GFR >/= 30 ml/min? Yes.    Patient's GFR today is >60 3. Is SCr </= 2? Yes.   Patient's SCr is 0.71 mg/dL 4. Did SCr increase >/= 0.5 in 24 hours? No. 5.Pt's weight >40kg  Yes.   6. Abnormal electrolyte(s): Phosphorus  7. Electrolytes replaced per protocol 8.  Call MD STAT for K+ </= 2.5, Phos </= 1, or Mag </= 1 Physician:  Dr. Aventura  Britiney Blahnik A Christapher Gillian 05/31/2023 5:42 AM

## 2023-05-31 NOTE — Anesthesia Postprocedure Evaluation (Signed)
 Anesthesia Post Note  Patient: Katherine Archer  Procedure(s) Performed: AMPUTATION, ABOVE KNEE SARTORIOUS MUSCLE FLAP (Right: Knee) IRRIGATION AND DEBRIDEMENT WOUND (Right: Leg Upper)     Patient location during evaluation: PACU Anesthesia Type: General Level of consciousness: awake and alert Pain management: pain level controlled Vital Signs Assessment: post-procedure vital signs reviewed and stable Respiratory status: spontaneous breathing, nonlabored ventilation, respiratory function stable and patient connected to nasal cannula oxygen Cardiovascular status: blood pressure returned to baseline and stable Postop Assessment: no apparent nausea or vomiting Anesthetic complications: no  No notable events documented.  Last Vitals:  Vitals:   05/31/23 1245 05/31/23 1252  BP: 135/76 136/70  Pulse: (!) 58 (!) 55  Resp: (!) 21 16  Temp:  36.4 C  SpO2: 96% 97%    Last Pain:  Vitals:   05/31/23 1245  TempSrc:   PainSc: 3                  Juventino Oppenheim

## 2023-05-31 NOTE — Progress Notes (Signed)
   NAME:  Katherine Archer, MRN:  [id number], DOB:  [birth year], LOS: 2 ADMISSION DATE:  05/28/2023, CONSULTATION DATE:  5/26 REFERRING MD:  EDP, CHIEF COMPLAINT:  RLE pain and swelling   History of Present Illness:  68 yo female with extensive vascular related pmh presents today with worsening RLE pain and swelling. She also endorses dizziness and inability to ambulate on R leg 2/2 pain. Daughter is at bedside and states that pt had a fem-pop bypass with redo and recently worsening swelling. She was also having purulent drainage from the wound site. She has been having fevers up to 106 at home per family. She also is having chills. Nausea no vomiting. No syncope but near syncope.   Upon arrival her R LE is considerably more swollen than Left. She has multiple open sites at the graft site with drainage actively leaking. R calf has been wrapped by vascular after eval. They are planning for debridement in OR in am. She was also noted to have hgb of 4.4, hypotensive on levo, temp >104. Denies numbness  Ccm has been asked to admit  Pertinent  Medical History  Htn Pad Chronic eliquis  use Hyperlipidemia H/o cva 2021  Significant Hospital Events: Including procedures, antibiotic start and stop dates in addition to other pertinent events   Admitted to icu 5/26  Interim History / Subjective:  No events. For OR this am.  Objective    Blood pressure (!) 110/55, pulse 60, temperature 98.2 F (36.8 C), temperature source Oral, resp. rate (!) 21, height 5\' 6"  (1.676 m), weight 61.9 kg, SpO2 97%.        Intake/Output Summary (Last 24 hours) at 05/31/2023 4098 Last data filed at 05/31/2023 0700 Gross per 24 hour  Intake 1366.98 ml  Output 1960 ml  Net -593.02 ml   Filed Weights   05/29/23 0500 05/30/23 0600 05/31/23 0600  Weight: 57.7 kg 61.1 kg 61.9 kg    Examination: No distress R leg unchanged Nonlabored breathing pattern Heart sounds regular Abd soft  H/H okay, repleting phos  Resolved  problem list   Assessment and Plan  Septic shock 2/2 infected R fem pop bypass w/ strep pyogenes post washout and excision 5/26, leg unfortunately not viable Acute anemia without overt bleeding- stable Leukocytosis- up postop H/o htn Hyperlipidemia  - PCN+linezolid x 6 weeks from source control - Pain control as ordered - Palliative input appreciated - AKA today with VVS, will check on after  Best Practice (right click and "Reselect all SmartList Selections" daily)   Diet/type: NPO DVT prophylaxis heparin  subQ Pressure ulcer(s): present on admission  GI prophylaxis: PPI Lines: Central line Foley:  Yes, and it is still needed Code Status:  full code Last date of multidisciplinary goals of care discussion [d/w daughter and pt at bedside 5/28 with translator]  Ardelle Kos MD PCCM

## 2023-05-31 NOTE — Interval H&P Note (Signed)
 History and Physical Interval Note:  05/31/2023 9:31 AM  Katherine Archer  has presented today for surgery, with the diagnosis of right leg infection and ischemia.  The various methods of treatment have been discussed with the patient and family. After consideration of risks, benefits and other options for treatment, the patient has consented to  Procedure(s) with comments: AMPUTATION, ABOVE KNEE (Right) IRRIGATION AND DEBRIDEMENT WOUND (Right) - RIGHT GROIN WASHOUT as a surgical intervention.  The patient's history has been reviewed, patient examined, no change in status, stable for surgery.  I have reviewed the patient's chart and labs.  Questions were answered to the patient's satisfaction.     Gareld June

## 2023-05-31 NOTE — Anesthesia Procedure Notes (Signed)
 Procedure Name: Intubation Date/Time: 05/31/2023 10:22 AM  Performed by: Merna Aase, CRNAPre-anesthesia Checklist: Patient identified, Patient being monitored, Timeout performed, Emergency Drugs available and Suction available Patient Re-evaluated:Patient Re-evaluated prior to induction Oxygen Delivery Method: Circle system utilized Preoxygenation: Pre-oxygenation with 100% oxygen Induction Type: IV induction Ventilation: Mask ventilation without difficulty Laryngoscope Size: Miller and 2 Grade View: Grade I Tube type: Oral Tube size: 7.0 mm Number of attempts: 1 Airway Equipment and Method: Stylet Placement Confirmation: ETT inserted through vocal cords under direct vision, positive ETCO2 and breath sounds checked- equal and bilateral Secured at: 21 cm Tube secured with: Tape Dental Injury: Teeth and Oropharynx as per pre-operative assessment

## 2023-05-31 NOTE — Progress Notes (Signed)
  Progress Note    05/31/2023 7:47 AM 2 Days Post-Op  Subjective:  resting comfortably.  No questions.  afebrile  Vitals:   05/31/23 0700 05/31/23 0739  BP: (!) 113/58   Pulse: (!) 58   Resp: 19   Temp:  98.2 F (36.8 C)  SpO2: 94%     Physical Exam: General:  no distress Cardiac:  regular Lungs:  non labored Incisions:  bandages to lower leg in tact   CBC    Component Value Date/Time   WBC 31.0 (H) 05/30/2023 0145   RBC 3.94 05/30/2023 0145   HGB 11.7 (L) 05/30/2023 0145   HGB 14.2 03/23/2021 0928   HCT 33.8 (L) 05/30/2023 0145   HCT 42.1 03/23/2021 0928   PLT 192 05/30/2023 0145   PLT 135 (L) 03/23/2021 0928   MCV 85.8 05/30/2023 0145   MCV 93 03/23/2021 0928   MCH 29.7 05/30/2023 0145   MCHC 34.6 05/30/2023 0145   RDW 15.6 (H) 05/30/2023 0145   RDW 12.0 03/23/2021 0928   LYMPHSABS 1.5 05/28/2023 2120   LYMPHSABS 1.9 03/23/2021 0928   MONOABS 0.9 05/28/2023 2120   EOSABS 0.0 05/28/2023 2120   EOSABS 0.3 03/23/2021 0928   BASOSABS 0.1 05/28/2023 2120   BASOSABS 0.0 03/23/2021 0928    BMET    Component Value Date/Time   NA 128 (L) 05/31/2023 0425   NA 138 01/15/2020 1128   K 4.5 05/31/2023 0425   CL 99 05/31/2023 0425   CO2 24 05/31/2023 0425   GLUCOSE 101 (H) 05/31/2023 0425   BUN 8 05/31/2023 0425   BUN 8 01/15/2020 1128   CREATININE 0.71 05/31/2023 0425   CALCIUM  8.0 (L) 05/31/2023 0425   GFRNONAA >60 05/31/2023 0425   GFRAA 80 01/15/2020 1128    INR    Component Value Date/Time   INR 1.5 (H) 05/29/2023 0030     Intake/Output Summary (Last 24 hours) at 05/31/2023 0747 Last data filed at 05/31/2023 0700 Gross per 24 hour  Intake 1373.32 ml  Output 2010 ml  Net -636.68 ml      Assessment/Plan:  68 y.o. female is s/p:  Ligation of the right axillobifemoral bypass graft limb Excision of the right common femoral artery Excision of right femoral to above-knee popliteal artery bypass with above-knee popliteal artery ligation Right  sided greater saphenous vein saphenectomy Excision of the right femoral to below-knee popliteal artery bypass with greater saphenous vein patch   Femoral to above-knee popliteal bypass tunnel with 15 French drain Femoral to below-knee popliteal bypass tunnel with 19 French drain Groin with white and blue VAC sponge   2 Days Post-Op   -plan for OR today for right groin washout and right above knee amputation -continue npo -DVT prophylaxis:  sq heparin    Maryanna Smart, PA-C [facility] [phone] 05/31/2023 7:47 AM

## 2023-05-31 NOTE — Plan of Care (Signed)
 Palliative:  Received voicemail from daughter, Renold Cashing. I returned call and left her a HIPAA compliant voicemail to share that I have unfortunately been asked not to share information with her and encouraged her to reach out to her mother and family for updates.   No charge  Vila Grayer, NP Palliative Medicine Team Pager [phone] (Please see [email or web address] for schedule) Team Phone [phone]

## 2023-05-31 NOTE — Transfer of Care (Signed)
 Immediate Anesthesia Transfer of Care Note  Patient: Katherine Archer  Procedure(s) Performed: AMPUTATION, ABOVE KNEE SARTORIOUS MUSCLE FLAP (Right: Knee) IRRIGATION AND DEBRIDEMENT WOUND (Right: Leg Upper)  Patient Location: PACU  Anesthesia Type:General  Level of Consciousness: awake  Airway & Oxygen Therapy: Patient Spontanous Breathing  Post-op Assessment: Report given to RN and Post -op Vital signs reviewed and stable  Post vital signs: Reviewed and stable  Last Vitals:  Vitals Value Taken Time  BP 130/59 05/31/23 1211  Temp 36.9 C 05/31/23 1211  Pulse 64 05/31/23 1214  Resp 14 05/31/23 1214  SpO2 95 % 05/31/23 1214  Vitals shown include unfiled device data.  Last Pain:  Vitals:   05/31/23 0832  TempSrc:   PainSc: 0-No pain      Patients Stated Pain Goal: (P) 0 (05/31/23 0800)  Complications: No notable events documented.

## 2023-05-31 NOTE — Plan of Care (Signed)

## 2023-05-31 NOTE — Progress Notes (Signed)
   05/31/23 1210  Spiritual Encounters  Type of Visit Initial  Care provided to: Family;Pt and family  Spiritual Framework  Presenting Themes Meaning/purpose/sources of inspiration;Values and beliefs;Caregiving needs  Values/beliefs Prayer, Hope  Community/Connection Family  Patient Stress Factors Exhausted;Loss of control  Family Stress Factors Major life changes;Health changes  Goals  Self/Personal Goals Make it through surgery  Interventions  Spiritual Care Interventions Made Established relationship of care and support;Compassionate presence;Decision-making support/facilitation  Intervention Outcomes  Outcomes Awareness of health;Awareness of support;Connection to spiritual care;Connected to spiritual community  Spiritual Care Plan  Spiritual Care Issues Still Outstanding Chaplain will continue to follow  Follow up plan  Just check up on PT and family needs  Advance Directives (For Healthcare)  Does Patient Have a Medical Advance Directive? Yes  Type of Advance Directive Healthcare Power of Attorney  Would patient like information on creating a medical advance directive? Yes (Inpatient - patient requests chaplain consult to create a medical advance directive)   Visited PT pre-operatively, PT appeared calm and alert. Daughter was present at bedside and expressed appreciation of AD, which was successfully uploaded to the PT's medical records.  Offered a word of prayer at the PT and  family and medical team, invoking peace and strength for the surgical procedure. Both PT & daughter expressed gratitude.  Isaiah 41:10 "So do not fear, for I am with you; do not be dismayed, for I am your God.I will strengthen you and help you; I will uphold you with my righteous right hand."

## 2023-05-31 NOTE — Anesthesia Preprocedure Evaluation (Addendum)
 Anesthesia Evaluation  Patient identified by MRN, date of birth, ID band Patient awake    Reviewed: Allergy & Precautions, NPO status , Patient's Chart, lab work & pertinent test results  Airway Mallampati: II  TM Distance: >3 FB Neck ROM: Full    Dental  (+) Edentulous Upper, Edentulous Lower   Pulmonary former smoker   Pulmonary exam normal        Cardiovascular hypertension, Pt. on medications + CAD and + Peripheral Vascular Disease  Normal cardiovascular exam   '21 TTE - EF 60 to 65%. There is mild concentric left ventricular hypertrophy. Grade II diastolic dysfunction (pseudonormalization). There is mildly elevated pulmonary artery systolic pressure. The estimated right ventricular systolic pressure is 36.3 mmHg.      Neuro/Psych  Neuromuscular disease CVA, Residual Symptoms  negative psych ROS   GI/Hepatic negative GI ROS, Neg liver ROS,,,  Endo/Other   Na 128   Renal/GU negative Renal ROS     Musculoskeletal  Ambulates with cane   Abdominal   Peds  Hematology  (+) Blood dyscrasia, anemia  On eliquis  INR 1.5    Anesthesia Other Findings   Reproductive/Obstetrics                             Anesthesia Physical Anesthesia Plan  ASA: 4  Anesthesia Plan: General   Post-op Pain Management: Tylenol  PO (pre-op)*, Ketamine  IV* and Dilaudid  IV   Induction: Intravenous  PONV Risk Score and Plan: 3 and Ondansetron , Dexamethasone  and Treatment may vary due to age or medical condition  Airway Management Planned: Oral ETT  Additional Equipment: None  Intra-op Plan:   Post-operative Plan: Extubation in OR  Informed Consent: I have reviewed the patients History and Physical, chart, labs and discussed the procedure including the risks, benefits and alternatives for the proposed anesthesia with the patient or authorized representative who has indicated his/her understanding and  acceptance.   Patient has DNR.  Discussed DNR with patient and Suspend DNR.   Dental advisory given and Interpreter used for interview  Plan Discussed with: CRNA and Anesthesiologist  Anesthesia Plan Comments:        Anesthesia Quick Evaluation

## 2023-06-01 ENCOUNTER — Other Ambulatory Visit: Payer: Self-pay

## 2023-06-01 ENCOUNTER — Encounter (HOSPITAL_COMMUNITY): Payer: Self-pay | Admitting: Surgery

## 2023-06-01 DIAGNOSIS — T827XXA Infection and inflammatory reaction due to other cardiac and vascular devices, implants and grafts, initial encounter: Secondary | ICD-10-CM

## 2023-06-01 DIAGNOSIS — T827XXD Infection and inflammatory reaction due to other cardiac and vascular devices, implants and grafts, subsequent encounter: Secondary | ICD-10-CM

## 2023-06-01 DIAGNOSIS — R7881 Bacteremia: Secondary | ICD-10-CM

## 2023-06-01 DIAGNOSIS — B950 Streptococcus, group A, as the cause of diseases classified elsewhere: Secondary | ICD-10-CM | POA: Diagnosis not present

## 2023-06-01 HISTORY — DX: Infection and inflammatory reaction due to other cardiac and vascular devices, implants and grafts, initial encounter: T82.7XXA

## 2023-06-01 LAB — TYPE AND SCREEN
ABO/RH(D): O POS
Antibody Screen: NEGATIVE
Unit division: 0
Unit division: 0
Unit division: 0
Unit division: 0
Unit division: 0
Unit division: 0
Unit division: 0

## 2023-06-01 LAB — BPAM RBC
Blood Product Expiration Date: 202506172359
Blood Product Expiration Date: 202506172359
Blood Product Expiration Date: 202506172359
Blood Product Expiration Date: 202506172359
Blood Product Expiration Date: 202506202359
Blood Product Unit Number: 202506202359
Blood Product Unit Number: 202506202359
ISSUE DATE / TIME: 202505220342
ISSUE DATE / TIME: 202505220342
ISSUE DATE / TIME: 202505260306
ISSUE DATE / TIME: 202505260917
ISSUE DATE / TIME: 202505260917
PRODUCT CODE: 202505260043
PRODUCT CODE: 202506202359
PRODUCT CODE: 202506202359
Unit Type and Rh: 202505261053
Unit Type and Rh: 202506202359
Unit Type and Rh: 5100
Unit Type and Rh: 5100
Unit Type and Rh: 5100
Unit Type and Rh: 5100
Unit Type and Rh: 5100
Unit Type and Rh: 5100
Unit Type and Rh: 5100
Unit Type and Rh: 5100

## 2023-06-01 LAB — SURGICAL PATHOLOGY

## 2023-06-01 LAB — RENAL FUNCTION PANEL
Albumin: 1.7 g/dL — ABNORMAL LOW (ref 3.5–5.0)
Anion gap: 4 — ABNORMAL LOW (ref 5–15)
BUN: 7 mg/dL — ABNORMAL LOW (ref 8–23)
CO2: 27 mmol/L (ref 22–32)
Calcium: 8.2 mg/dL — ABNORMAL LOW (ref 8.9–10.3)
Chloride: 98 mmol/L (ref 98–111)
Creatinine, Ser: 0.68 mg/dL (ref 0.44–1.00)
GFR, Estimated: >60 mL/min (ref >60)
Glucose, Bld: 107 mg/dL — ABNORMAL HIGH (ref 70–99)
Phosphorus: 3 mg/dL (ref 2.5–4.6)
Potassium: 4.4 mmol/L (ref 3.5–5.1)
Sodium: 129 mmol/L — ABNORMAL LOW (ref 135–145)

## 2023-06-01 LAB — GLUCOSE, CAPILLARY
Glucose-Capillary: 96 mg/dL (ref 70–99)
Glucose-Capillary: 118 mg/dL — ABNORMAL HIGH (ref 70–99)
Glucose-Capillary: 98 mg/dL (ref 70–99)
Glucose-Capillary: 91 mg/dL (ref 70–99)

## 2023-06-01 LAB — CBC
HCT: 33.1 % — ABNORMAL LOW (ref 36.0–46.0)
Hemoglobin: 11.3 g/dL — ABNORMAL LOW (ref 12.0–15.0)
MCH: 29.8 pg (ref 26.0–34.0)
MCHC: 34.1 g/dL (ref 30.0–36.0)
MCV: 87.3 fL (ref 80.0–100.0)
Platelets: 309 10*3/uL (ref 150–400)
RBC: 3.79 MIL/uL — ABNORMAL LOW (ref 3.87–5.11)
RDW: 15.3 % (ref 11.5–15.5)
WBC: 36.7 10*3/uL — ABNORMAL HIGH (ref 4.0–10.5)
nRBC: 0.1 % (ref 0.0–0.2)

## 2023-06-01 MED ORDER — DAPTOMYCIN-SODIUM CHLORIDE 500-0.9 MG/50ML-% IV SOLN
8.0000 mg/kg | Freq: Every day | INTRAVENOUS | Status: DC
  Administered 2023-06-01 – 2023-06-05 (×5): 500 mg via INTRAVENOUS
  Filled 2023-06-01 (×7): qty 50

## 2023-06-01 MED ORDER — ROSUVASTATIN CALCIUM 20 MG PO TABS
40.0000 mg | ORAL_TABLET | Freq: Every day | ORAL | Status: DC
  Administered 2023-06-01 – 2023-06-06 (×6): 40 mg via ORAL
  Filled 2023-06-01 (×6): qty 2

## 2023-06-01 MED ORDER — SODIUM CHLORIDE 0.9% FLUSH: 10.0000 mL | INTRAVENOUS | Status: DC | PRN

## 2023-06-01 MED ORDER — HYDRALAZINE HCL 25 MG PO TABS: 25.0000 mg | ORAL_TABLET | Freq: Three times a day (TID) | ORAL | Status: DC | PRN

## 2023-06-01 MED ORDER — ORAL CARE MOUTH RINSE: 15.0000 mL | OROMUCOSAL | Status: DC | PRN

## 2023-06-01 MED ORDER — GABAPENTIN 300 MG PO CAPS
300.0000 mg | ORAL_CAPSULE | Freq: Two times a day (BID) | ORAL | Status: DC
  Administered 2023-06-01 – 2023-06-07 (×13): 300 mg via ORAL
  Filled 2023-06-01 (×13): qty 1

## 2023-06-01 MED ORDER — HYDRALAZINE HCL 25 MG PO TABS
25.0000 mg | ORAL_TABLET | Freq: Three times a day (TID) | ORAL | Status: DC
  Administered 2023-06-01 – 2023-06-07 (×19): 25 mg via ORAL
  Filled 2023-06-01 (×19): qty 1

## 2023-06-01 MED ORDER — EZETIMIBE 10 MG PO TABS
10.0000 mg | ORAL_TABLET | Freq: Every day | ORAL | Status: DC
  Administered 2023-06-01 – 2023-06-07 (×7): 10 mg via ORAL
  Filled 2023-06-01 (×7): qty 1

## 2023-06-01 NOTE — Progress Notes (Addendum)
 Progress Note    06/01/2023 6:43 AM 1 Day Post-Op  Subjective:  having some pain more so in the groin.    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 and dry    CBC    Component Value Date/Time   WBC 31.0 (H) 05/30/2023 0145   RBC 3.94 05/30/2023 0145   HGB 11.7 (L) 05/30/2023 0145   HGB 14.2 03/23/2021 0928   HCT 33.8 (L) 05/30/2023 0145   HCT 42.1 03/23/2021 0928   PLT 192 05/30/2023 0145   PLT 135 (L) 03/23/2021 0928   MCV 85.8 05/30/2023 0145   MCV 93 03/23/2021 0928   MCH 29.7 05/30/2023 0145   MCHC 34.6 05/30/2023 0145   RDW 15.6 (H) 05/30/2023 0145   RDW 12.0 03/23/2021 0928   LYMPHSABS 1.5 05/28/2023 2120   LYMPHSABS 1.9 03/23/2021 0928   MONOABS 0.9 05/28/2023 2120   EOSABS 0.0 05/28/2023 2120   EOSABS 0.3 03/23/2021 0928   BASOSABS 0.1 05/28/2023 2120   BASOSABS 0.0 03/23/2021 0928    BMET    Component Value Date/Time   NA 129 (L) 06/01/2023 0500   NA 138 01/15/2020 1128   K 4.4 06/01/2023 0500   CL 98 06/01/2023 0500   CO2 27 06/01/2023 0500   GLUCOSE 107 (H) 06/01/2023 0500   BUN 7 (L) 06/01/2023 0500   BUN 8 01/15/2020 1128   CREATININE 0.68 06/01/2023 0500   CALCIUM  8.2 (L) 06/01/2023 0500   GFRNONAA >60 06/01/2023 0500   GFRAA 80 01/15/2020 1128    INR    Component Value Date/Time   INR 1.5 (H) 05/29/2023 0030     Intake/Output Summary (Last 24 hours) at 06/01/2023 1610 Last data filed at 06/01/2023 0630 Gross per 24 hour  Intake 2086.36 ml  Output 2815 ml  Net -728.64 ml      Assessment/Plan:  68 y.o. female is s/p:  Ligation of the right axillobifemoral bypass graft limb Excision of the right common femoral artery Excision of right femoral to above-knee popliteal artery bypass with above-knee  popliteal artery ligation Right sided greater saphenous vein saphenectomy Excision of the right femoral to below-knee popliteal artery bypass with greater saphenous vein patch Femoral to above-knee popliteal bypass tunnel with 15 French drain Femoral to below-knee popliteal bypass tunnel with 19 French drain Groin with white and blue VAC sponge  05/29/2023 by Dr. Rosalva Comber  And    I&D right groin with Sartorious muscle flap, placement of Kerecis and abx beads and vac placement and right AKA on 05/31/2023 by Dr. Charlotte Cookey    -pt right groin with vac in place with good seal -bandage to right AKA stump is clean and dry-will remove this tomorrow.  -check CBC now and tomorrow -DVT prophylaxis:  sq heparin  -continue abx   Samantha Rhyne, PA-C [facility] [phone] 06/01/2023 6:43 AM  VASCULAR STAFF ADDENDUM: I have independently interviewed and examined the patient. I agree with the above.  Overall she is doing well.  She can be transition to the floor today.  Pain is controlled.   Will discuss VAC change time with Dr. Bevin Bucks.  Likely next week. Constipated, recommend increase bowel regimen. Appreciate critical care  involvement and hospital medicine involvement once floor ready.  Kayla Part MD [facility] Phone Number: [phone] 06/01/2023 8:34 AM

## 2023-06-01 NOTE — Progress Notes (Signed)
 PHARMACY CONSULT NOTE FOR:  OUTPATIENT  PARENTERAL ANTIBIOTIC THERAPY (OPAT)  Indication: Bacteremia/Right groin graft infection  Regimen: Daptomycin 500 mg IV Q 24 hours  End date: 07/12/23   IV antibiotic discharge orders are pended. To discharging provider:  please sign these orders via discharge navigator,  Select New Orders & click on the button choice - Manage This Unsigned Work.     Thank you for allowing pharmacy to be a part of this patient's care.  Denson Flake, PharmD, BCPS, BCIDP Infectious Diseases Clinical Pharmacist Phone: [phone] 06/01/2023, 11:34 AM

## 2023-06-01 NOTE — Progress Notes (Addendum)
 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   Admission bcx 5/25 group a strep Operative cx from 5/26 growing group a strep and mrsa  Discussed with vascular, who relates that graft remains would be involved in the infection and prefer life long antibiotics therapy    PLAN: Plan 6 weeks iv abx with daptomycin then transition to mrsa/GAS abx suppression (hope bactrim works for [employer] as that is a consideration) Dc linezolid and penicillin g Removal central line per pulm/ccm protocol Picc can be placed after internal jugular removal Maintain standard isolation precaution Opat and id clinic f/u information below Discussed with pulm ccm  ---------- Addendum Mrsa r to bactrim Plan oral after 6 weeks iv as follow --> doxy/pen K for 2 months, then only doxy there after  -----------    OPAT Orders Discharge antibiotics to be given via PICC line Discharge antibiotics: Daptomycin  Duration: 6 weeks End Date: 07/12/23  Then transition to oral MRSA suppression there after  [facility] Care Per Protocol:  Home health RN for IV administration and teaching; PICC line care and labs.    Labs weekly while on IV antibiotics: _x_ CBC with differential __ BMP _x_ CMP _x_ CRP _x_ ESR __ Vancomycin  trough __ CK  _x_ Please pull PIC at completion of IV antibiotics __ Please leave PIC in place until doctor has seen patient or been notified  Fax weekly labs to [phone]  Clinic Follow Up Appt: 6/18 @ 1045  @  [facility] [address], [place], [place] [address] Phone: [phone]   Principal Problem:   Septic shock (HCC) Active Problems:   Streptococcal bacteremia   No Active Allergies  Scheduled Meds:  sodium chloride    Intravenous Once   Chlorhexidine  Gluconate Cloth  6 each Topical Daily   ezetimibe   10 mg Oral Daily   gabapentin  300 mg Oral BID   heparin  injection (subcutaneous)  5,000 Units Subcutaneous Q8H   hydrALAZINE   25 mg Oral Q8H   insulin aspart  0-9 Units Subcutaneous TID WC   irbesartan   75 mg Oral Daily   mupirocin ointment  1 Application Nasal BID   rosuvastatin   40 mg Oral Daily   sodium chloride  flush  10-40 mL Intracatheter Q12H   Continuous Infusions:  sodium chloride      linezolid (ZYVOX) IV Stopped (06/01/23 1057)   penicillin G potassium 12 Million Units in dextrose  5 % 500 mL CONTINUOUS infusion 41.7 mL/hr at 06/01/23 1100   PRN Meds:.acetaminophen , docusate sodium , hydrALAZINE , HYDROmorphone  (DILAUDID ) injection, ondansetron  (ZOFRAN ) IV, mouth rinse, oxyCODONE , polyethylene glycol, sodium chloride  flush   SUBJECTIVE: Right stump phantom pain but doing well Wants the right neck internal jugular cvc out No other complaint  Graft cx growing mrsa and GAS  Review of Systems: ROS All other ROS was negative, except mentioned above     OBJECTIVE: Vitals:   06/01/23 0800 06/01/23 0900 06/01/23 1000 06/01/23 1100  BP: (!) 152/57 108/70 (!) 151/54 130/76  Pulse: 64 64 (!) 58 64  Resp: (!) 24 (!) 21 18 (!) 22  Temp:      TempSrc:      SpO2: 94% 98% 93% 98%  Weight:      Height:       Body mass index is 20.71 kg/m.  Physical Exam General/constitutional: no distress, pleasant HEENT: Normocephalic, PER, Conj Clear, EOMI, Oropharynx clear Neck supple CV: rrr no mrg Lungs: clear to auscultation, normal respiratory effort Abd: Soft, Nontender Ext: no edema Skin: No Rash Neuro: nonfocal MSK: right aka stump dressing clean/dry; a surgical drain present  Central  line: right internal jugular cvc site no bleeding/purulence  Lab Results Lab Results  Component Value Date   WBC 36.7 (H) 06/01/2023   HGB 11.3 (L) 06/01/2023   HCT 33.1 (L) 06/01/2023   MCV 87.3 06/01/2023   PLT 309 06/01/2023    Lab Results  Component Value Date   CREATININE 0.68 06/01/2023   BUN 7 (L) 06/01/2023   NA 129 (L) 06/01/2023   K 4.4 06/01/2023   CL 98 06/01/2023   CO2 27 06/01/2023    Lab Results  Component Value Date   ALT 124 (H) 05/28/2023   AST 209 (H) 05/28/2023   ALKPHOS 80 05/28/2023   BILITOT 1.3 (H) 05/28/2023      Microbiology: Recent Results (from the past 240 hours)  Resp panel by RT-PCR (RSV, Flu A&B, Covid) Anterior Nasal Swab     Status: None   Collection Time: 05/28/23  9:27 PM   Specimen: Anterior Nasal Swab  Result Value Ref Range Status   SARS Coronavirus 2 by RT PCR NEGATIVE NEGATIVE Final   Influenza A by PCR NEGATIVE NEGATIVE Final   Influenza B by PCR NEGATIVE NEGATIVE Final    Comment: (NOTE) The Xpert Xpress SARS-CoV-2/FLU/RSV plus assay is intended as an aid in the diagnosis of influenza from Nasopharyngeal swab specimens and should not be used as a sole basis for treatment. Nasal washings and aspirates are unacceptable for Xpert Xpress SARS-CoV-2/FLU/RSV testing.  Fact Sheet for Patients: BloggerCourse.com  Fact Sheet for Healthcare Providers: SeriousBroker.it  This test is not yet approved or cleared by the [place]  FDA and has been authorized for detection and/or diagnosis of SARS-CoV-2 by FDA under an Emergency Use Authorization (EUA). This EUA will remain in effect (meaning this test can be used) for the duration of the COVID-19 declaration under Section 564(b)(1) of the Act, 21 U.S.C. section 360bbb-3(b)(1), unless the authorization is terminated or revoked.     Resp Syncytial Virus by PCR NEGATIVE NEGATIVE Final    Comment: (NOTE) Fact Sheet for  Patients: BloggerCourse.com  Fact Sheet for Healthcare Providers: SeriousBroker.it  This test is not yet approved or cleared by the [place]  FDA and has been authorized for detection and/or diagnosis of SARS-CoV-2 by FDA under an Emergency Use Authorization (EUA). This EUA will remain in effect (meaning this test can be used) for the duration of the COVID-19 declaration under Section 564(b)(1) of the Act, 21 U.S.C. section 360bbb-3(b)(1), unless the authorization is terminated or revoked.  Performed at [facility], [address], [place], [place] [address]   Blood Culture (routine x 2)     Status: Abnormal   Collection Time: 05/28/23  9:27 PM   Specimen: BLOOD RIGHT ARM  Result Value Ref Range Status   Specimen Description BLOOD RIGHT ARM  Final   Special Requests   Final    BOTTLES DRAWN AEROBIC ONLY Blood Culture results may not be optimal due to an inadequate volume of blood received in culture  bottles   Culture  Setup Time   Final    GRAM POSITIVE COCCI IN CHAINS BOTTLES DRAWN AEROBIC ONLY CRITICAL RESULT CALLED TO, READ BACK BY AND VERIFIED WITH: PHARMD ASerena Dana [id number] @ 2045 FH     Culture (A)  Final    GROUP A STREP (S.PYOGENES) ISOLATED HEALTH DEPARTMENT NOTIFIED Performed at [facility], [address], [place], [place] [address]    Report Status 05/31/2023 FINAL  Final   Organism ID, Bacteria GROUP A STREP (S.PYOGENES) ISOLATED  Final      Susceptibility   Group a strep (s.pyogenes) isolated - MIC*    PENICILLIN  <=0.06 SENSITIVE Sensitive     CEFTRIAXONE  <=0.12 SENSITIVE Sensitive     ERYTHROMYCIN <=0.12 SENSITIVE Sensitive     LEVOFLOXACIN 1 SENSITIVE Sensitive     VANCOMYCIN  0.5 SENSITIVE Sensitive     * GROUP A STREP (S.PYOGENES) ISOLATED  Blood Culture ID Panel (Reflexed)     Status: Abnormal   Collection Time: 05/28/23  9:27 PM  Result Value Ref Range Status   Enterococcus faecalis NOT  DETECTED NOT DETECTED Final   Enterococcus Faecium NOT DETECTED NOT DETECTED Final   Listeria monocytogenes NOT DETECTED NOT DETECTED Final   Staphylococcus species NOT DETECTED NOT DETECTED Final   Staphylococcus aureus (BCID) NOT DETECTED NOT DETECTED Final   Staphylococcus epidermidis NOT DETECTED NOT DETECTED Final   Staphylococcus lugdunensis NOT DETECTED NOT DETECTED Final   Streptococcus species DETECTED (A) NOT DETECTED Final    Comment: CRITICAL RESULT CALLED TO, READ BACK BY AND VERIFIED WITH: PHARMD ASerena Dana [id number] @ 2045 FH     Streptococcus agalactiae NOT DETECTED NOT DETECTED Final   Streptococcus pneumoniae NOT DETECTED NOT DETECTED Final   Streptococcus pyogenes DETECTED (A) NOT DETECTED Final    Comment: CRITICAL RESULT CALLED TO, READ BACK BY AND VERIFIED WITH: PHARMD ASerena Dana [id number] @ 2045 FH     A.calcoaceticus-baumannii NOT DETECTED NOT DETECTED Final   Bacteroides fragilis NOT DETECTED NOT DETECTED Final   Enterobacterales NOT DETECTED NOT DETECTED Final   Enterobacter cloacae complex NOT DETECTED NOT DETECTED Final   Escherichia coli NOT DETECTED NOT DETECTED Final   Klebsiella aerogenes NOT DETECTED NOT DETECTED Final   Klebsiella oxytoca NOT DETECTED NOT DETECTED Final   Klebsiella pneumoniae NOT DETECTED NOT DETECTED Final   Proteus species NOT DETECTED NOT DETECTED Final   Salmonella species NOT DETECTED NOT DETECTED Final   Serratia marcescens NOT DETECTED NOT DETECTED Final   Haemophilus influenzae NOT DETECTED NOT DETECTED Final   Neisseria meningitidis NOT DETECTED NOT DETECTED Final   Pseudomonas aeruginosa NOT DETECTED NOT DETECTED Final   Stenotrophomonas maltophilia NOT DETECTED NOT DETECTED Final   Candida albicans NOT DETECTED NOT DETECTED Final   Candida auris NOT DETECTED NOT DETECTED Final   Candida glabrata NOT DETECTED NOT DETECTED Final   Candida krusei NOT DETECTED NOT DETECTED Final   Candida parapsilosis NOT DETECTED NOT DETECTED Final    Candida tropicalis NOT DETECTED NOT DETECTED Final   Cryptococcus neoformans/gattii NOT DETECTED NOT DETECTED Final    Comment: Performed at [facility], [address], [place], [place] [address]  Blood Culture (routine x 2)     Status: None (Preliminary result)   Collection Time: 05/28/23 10:46 PM   Specimen: BLOOD  Result Value Ref Range Status   Specimen Description BLOOD BLOOD LEFT ARM  Final   Special Requests   Final    AEROBIC BOTTLE ONLY Blood Culture  results may not be optimal due to an inadequate volume of blood received in culture bottles   Culture   Final    NO GROWTH 4 DAYS Performed at [facility], [address], [place], [place] [address]    Report Status PENDING  Incomplete  Surgical PCR screen     Status: None   Collection Time: 05/29/23  2:55 AM   Specimen: Nasal Mucosa; Nasal Swab  Result Value Ref Range Status   MRSA, PCR NEGATIVE NEGATIVE Final   Staphylococcus aureus NEGATIVE NEGATIVE Final    Comment: (NOTE) The Xpert SA Assay (FDA approved for NASAL specimens in patients 69 years of age and older), is one component of a comprehensive surveillance program. It is not intended to diagnose infection nor to guide or monitor treatment. Performed at [facility], [address], [place], [place] [address]   Aerobic/Anaerobic Culture w Gram Stain (surgical/deep wound)     Status: None (Preliminary result)   Collection Time: 05/29/23  9:11 AM   Specimen: Groin, Right; Abscess  Result Value Ref Range Status   Specimen Description ABSCESS  Final   Special Requests RIGHT GROIN  Final   Gram Stain   Final    WBC PRESENT, PREDOMINANTLY MONONUCLEAR RARE GRAM POSITIVE COCCI IN PAIRS Performed at [facility], [address] [place], [place] [address]    Culture   Final    RARE METHICILLIN RESISTANT STAPHYLOCOCCUS AUREUS MODERATE GROUP A STREP (S.PYOGENES) ISOLATED Beta hemolytic streptococci are predictably susceptible to penicillin  and other beta lactams. Susceptibility testing not routinely performed. NO ANAEROBES ISOLATED; CULTURE IN PROGRESS FOR 5 DAYS    Report Status PENDING  Incomplete   Organism ID, Bacteria METHICILLIN RESISTANT STAPHYLOCOCCUS AUREUS  Final      Susceptibility   Methicillin resistant staphylococcus aureus - MIC*    CIPROFLOXACIN >=8 RESISTANT Resistant     ERYTHROMYCIN <=0.25 SENSITIVE Sensitive     GENTAMICIN 8 INTERMEDIATE Intermediate     OXACILLIN >=4 RESISTANT Resistant     TETRACYCLINE <=1 SENSITIVE Sensitive     VANCOMYCIN  <=0.5 SENSITIVE Sensitive     TRIMETH/SULFA 160 RESISTANT Resistant     CLINDAMYCIN <=0.25 SENSITIVE Sensitive     RIFAMPIN <=0.5 SENSITIVE Sensitive     Inducible Clindamycin NEGATIVE Sensitive     LINEZOLID 2 SENSITIVE Sensitive     * RARE METHICILLIN RESISTANT STAPHYLOCOCCUS AUREUS  MRSA Next Gen by PCR, Nasal     Status: None   Collection Time: 05/29/23 12:21 PM   Specimen: Nasal Mucosa; Nasal Swab  Result Value Ref Range Status   MRSA by PCR Next Gen NOT DETECTED NOT DETECTED Final    Comment: (NOTE) The GeneXpert MRSA Assay (FDA approved for NASAL specimens only), is one component of a comprehensive MRSA colonization surveillance program. It is not intended to diagnose MRSA infection nor to guide or monitor treatment for MRSA infections. Test performance is not FDA approved in patients less than 67 years old. Performed at [facility], [address] [place], [place] [address]   Culture, blood (Routine X 2) w Reflex to ID Panel     Status: None (Preliminary result)   Collection Time: 05/30/23  5:42 PM   Specimen: BLOOD  Result Value Ref Range Status   Specimen Description BLOOD SITE NOT SPECIFIED  Final   Special Requests   Final    BOTTLES DRAWN AEROBIC ONLY Blood Culture adequate volume   Culture   Final  NO GROWTH 2 DAYS Performed at [facility], [address] [place], [place] [address]    Report Status PENDING  Incomplete   Culture, blood (Routine X 2) w Reflex to ID Panel     Status: None (Preliminary result)   Collection Time: 05/30/23  5:42 PM   Specimen: BLOOD  Result Value Ref Range Status   Specimen Description BLOOD SITE NOT SPECIFIED  Final   Special Requests   Final    BOTTLES DRAWN AEROBIC ONLY Blood Culture adequate volume   Culture   Final    NO GROWTH 2 DAYS Performed at [facility], [address], [place], [place] [address]    Report Status PENDING  Incomplete     Serology:   Imaging: If present, new imagings (plain films, ct scans, and mri) have been personally visualized and interpreted; radiology reports have been reviewed. Decision making incorporated into the Impression / Recommendations.   Jamesetta Mcbride, MD [facility] [phone] pager    06/01/2023, 11:20 AM

## 2023-06-01 NOTE — Progress Notes (Signed)
 Pt complaining of inability to void and discomfort. Pt and Pt's family requesting for foley catheter to be replaced. Pt and Pt's family educated about the increased risk for infection related to having a foley catheter and after verbalizing understanding of risks, Pt is still requesting for Foley. MD notified.

## 2023-06-01 NOTE — TOC Progression Note (Addendum)
 Transition of Care [facility]) - Progression Note    Patient Details  Name: Katherine Archer MRN: [id number] Date of Birth: 14-Sep-1955  Transition of Care [facility]) CM/SW Contact  Benjiman Bras, RN Phone Number: [phone] 06/01/2023, 1:05 PM  Clinical Narrative:     Referral to [facility] rep, Kay Parson RN, pt will need IV abx at home. Medicare.gov list with rating provide to pt/grand-dtr and placed on chart.   Will speak to dtr about arranging HH. Waiting PT/OT evaluation and recommendation.   Family wants a scooter, explained PCP or surgeon will work with her on DME.  CM will set up bedside commode, oxygen, and hospital bed for dc home.   Expected Discharge Plan: Home w Home Health Services Barriers to Discharge: Continued Medical Work up  Expected Discharge Plan and Services   Discharge Planning Services: CM Consult Post Acute Care Choice: Home Health Living arrangements for the past 2 months: Apartment                           HH Arranged: RN HH Agency: [employer] Date HH Agency Contacted: 06/01/23 Time HH Agency Contacted: 1305 Representative spoke with at [facility] Agency: Kay Parson RN   Social Determinants of Health (SDOH) Interventions SDOH Screenings   Food Insecurity: No Food Insecurity (06/01/2023)  Housing: Low Risk  (05/30/2023)  Transportation Needs: No Transportation Needs (05/30/2023)  Utilities: Not At Risk (05/30/2023)  Depression (PHQ2-9): Low Risk  (06/29/2020)  Social Connections: Socially Isolated (05/30/2023)  Tobacco Use: Medium Risk (05/31/2023)    Readmission Risk Interventions    05/15/2023   11:59 AM 01/28/2022   11:32 AM  Readmission Risk Prevention Plan  Post Dischage Appt Complete Complete  Medication Screening Complete Complete  Transportation Screening Complete Complete

## 2023-06-01 NOTE — Plan of Care (Signed)

## 2023-06-01 NOTE — Plan of Care (Signed)
  Problem: Nutrition: Goal: Adequate nutrition will be maintained Outcome: Progressing   Problem: Elimination: Goal: Will not experience complications related to urinary retention Outcome: Progressing   Problem: Pain Managment: Goal: General experience of comfort will improve and/or be controlled Outcome: Progressing   Problem: Safety: Goal: Ability to remain free from injury will improve Outcome: Progressing   Problem: Skin Integrity: Goal: Risk for impaired skin integrity will decrease Outcome: Progressing

## 2023-06-01 NOTE — Progress Notes (Signed)
 Daughter at bedside Adding Gabapentin  for phantom pain   Katherine Archer

## 2023-06-01 NOTE — Progress Notes (Addendum)
 NAME:  Katherine Archer, MRN:  [id number], DOB:  09/04/1955, LOS: 3 ADMISSION DATE:  05/28/2023, CONSULTATION DATE:  5/26 REFERRING MD:  EDP, CHIEF COMPLAINT:  RLE pain and swelling   History of Present Illness:  68 yo female with extensive vascular related pmh presents today with worsening RLE pain and swelling. She also endorses dizziness and inability to ambulate on R leg 2/2 pain. Daughter is at bedside and states that pt had a fem-pop bypass with redo and recently worsening swelling. She was also having purulent drainage from the wound site. She has been having fevers up to 106 at home per family. She also is having chills. Nausea no vomiting. No syncope but near syncope.   Upon arrival her R LE is considerably more swollen than Left. She has multiple open sites at the graft site with drainage actively leaking. R calf has been wrapped by vascular after eval. They are planning for debridement in OR in am. She was also noted to have hgb of 4.4, hypotensive on levo, temp >104. Denies numbness  Ccm has been asked to admit  Pertinent  Medical History  Htn Pad Chronic eliquis  use Hyperlipidemia H/o cva 2021  Significant Hospital Events: Including procedures, antibiotic start and stop dates in addition to other pertinent events   5/26 Admitted to icu  5/28 underwent placement of antibiotic impregnated beads to right groin with wound VAC placed and right above-the-knee amputation 5/29 gabapentin added for phantom limb pain  Interim History / Subjective:  States she feels well apart from moderate phantom limb pain  Objective    Blood pressure 108/70, pulse (!) 58, temperature 98 F (36.7 C), temperature source Oral, resp. rate 18, height 5\' 6"  (1.676 m), weight 58.2 kg, SpO2 93%.        Intake/Output Summary (Last 24 hours) at 06/01/2023 1004 Last data filed at 06/01/2023 1000 Gross per 24 hour  Intake 2210.34 ml  Output 2815 ml  Net -604.66 ml   Filed Weights   05/31/23 0600 05/31/23  0824 06/01/23 0511  Weight: 61.9 kg 61.9 kg 58.2 kg    Examination: General: Acute on chronic ill-appearing thin middle-aged female lying in bed in no acute distress HEENT: [place]/AT, MM pink/moist, PERRL,  Neuro: Alert and oriented x 3, nonfocal CV: s1s2 regular rate and rhythm, no murmur, rubs, or gallops,  PULM: Clear to auscultation bilaterally, no increased work of breathing, no added breath sounds, on room air GI: soft, bowel sounds active in all 4 quadrants, non-tender, non-distended, tolerating oral diet Extremities: warm/dry, no edema  Skin: no rashes or lesions  Resolved problem list  Septic shock  Assessment and Plan  Infected R fem pop bypass w/ strep pyogenes now s/p right AKA -Post washout and excision 5/26, leg unfortunately not viable -Started on Eliquis  after bypass for thrombectomy management May of this year P: Continue linezolid and penicillin G, tentative plan to continue penicillin x6 weeks will order PICC line today  Gabapentin for phantom limb pain Local wound care PT/OT as able Wound VAC per surgery Prophylactic subcu heparin  for now  Acute anemia without overt bleeding P: Hemoglobin remains stable Trend CBC Transfuse per protocol Hemoglobin goal greater than 7  Leukocytosis P: Antibiotics as above Trend CBC Monitor fever curve  Essential hypertension Hyperlipidemia -Home medications include Eliquis , hydralazine , Crestor , valsartan P: Resume home hydralazine  and statin Hold home Tiadylt  amount of bradycardia Continuous telemetry Optimize electrolytes  Hyponatremia - Slowly improving P: Trend CMP  Stable for transfer out of ICU,  will ask TRH to resume care with vascular consult starting 5/30  Best Practice (right click and "Reselect all SmartList Selections" daily)   Diet/type: NPO DVT prophylaxis heparin  subQ Pressure ulcer(s): present on admission  GI prophylaxis: PPI Lines: Central line Foley:  Yes, and it is still needed Code  Status:  full code Last date of multidisciplinary goals of care discussion [d/w daughter and pt at bedside 5/28 with translator]  Nabilah Davoli D. Harris, NP-C [facility] Personal contact information can be found on Amion  If no contact or response made please call [phone] 06/01/2023, 10:21 AM

## 2023-06-01 NOTE — Progress Notes (Signed)
 Palliative:  ***  Yong Channel, NP Palliative Medicine Team Pager [phone] (Please see [email or web address] for schedule) Team Phone [phone]

## 2023-06-02 DIAGNOSIS — A419 Sepsis, unspecified organism: Secondary | ICD-10-CM | POA: Diagnosis not present

## 2023-06-02 DIAGNOSIS — R6521 Severe sepsis with septic shock: Secondary | ICD-10-CM | POA: Diagnosis not present

## 2023-06-02 LAB — CBC
HCT: 30.7 % — ABNORMAL LOW (ref 36.0–46.0)
Hemoglobin: 10.2 g/dL — ABNORMAL LOW (ref 12.0–15.0)
MCH: 29.3 pg (ref 26.0–34.0)
MCHC: 33.2 g/dL (ref 30.0–36.0)
MCV: 88.2 fL (ref 80.0–100.0)
Platelets: 289 10*3/uL (ref 150–400)
RBC: 3.48 MIL/uL — ABNORMAL LOW (ref 3.87–5.11)
RDW: 15.3 % (ref 11.5–15.5)
WBC: 22 10*3/uL — ABNORMAL HIGH (ref 4.0–10.5)
nRBC: 0.2 % (ref 0.0–0.2)

## 2023-06-02 LAB — CULTURE, BLOOD (ROUTINE X 2): Culture: NO GROWTH

## 2023-06-02 LAB — RENAL FUNCTION PANEL
Albumin: 1.6 g/dL — ABNORMAL LOW (ref 3.5–5.0)
Anion gap: 6 (ref 5–15)
BUN: <5 mg/dL — ABNORMAL LOW (ref 8–23)
CO2: 25 mmol/L (ref 22–32)
Calcium: 8.4 mg/dL — ABNORMAL LOW (ref 8.9–10.3)
Chloride: 103 mmol/L (ref 98–111)
Creatinine, Ser: 0.73 mg/dL (ref 0.44–1.00)
GFR, Estimated: >60 mL/min (ref >60)
Glucose, Bld: 87 mg/dL (ref 70–99)
Phosphorus: 3.2 mg/dL (ref 2.5–4.6)
Potassium: 4.4 mmol/L (ref 3.5–5.1)
Sodium: 134 mmol/L — ABNORMAL LOW (ref 135–145)

## 2023-06-02 LAB — CK: Total CK: 2112 U/L — ABNORMAL HIGH (ref 38–234)

## 2023-06-02 LAB — GLUCOSE, CAPILLARY
Glucose-Capillary: 94 mg/dL (ref 70–99)
Glucose-Capillary: 117 mg/dL — ABNORMAL HIGH (ref 70–99)
Glucose-Capillary: 133 mg/dL — ABNORMAL HIGH (ref 70–99)

## 2023-06-02 MED ORDER — ENSURE PLUS HIGH PROTEIN PO LIQD
237.0000 mL | Freq: Two times a day (BID) | ORAL | Status: DC
  Administered 2023-06-02 – 2023-06-07 (×12): 237 mL via ORAL

## 2023-06-02 NOTE — TOC Progression Note (Signed)
 Transition of Care [facility]) - Progression Note    Patient Details  Name: Katherine Archer MRN: [id number] Date of Birth: 1955-04-14  Transition of Care [facility]) CM/SW Contact  Graves-Bigelow, Jari Merles, RN Phone Number: 06/02/2023, 12:37 PM  Clinical Narrative: Patient transferred from 2 H.  [facility] is following the patient for IV antibiotic therapy. PT/OT to consult for recommendations for home vs SNF. If the plan is for home; patient will need [facility] RN arranged. Case Manager will continue to follow for additional transition of care needs.   Expected Discharge Plan: Home w Home Health Services Barriers to Discharge: Continued Medical Work up  Expected Discharge Plan and Services   Discharge Planning Services: CM Consult Post Acute Care Choice: Home Health Living arrangements for the past 2 months: Apartment   HH Arranged: RN HH Agency: [employer], [employer] Date HH Agency Contacted: 06/01/23 Time HH Agency Contacted: 1305 Representative spoke with at [facility] Agency: Kay Parson RN   Social Determinants of Health (SDOH) Interventions SDOH Screenings   Food Insecurity: No Food Insecurity (06/01/2023)  Housing: Low Risk  (05/30/2023)  Transportation Needs: No Transportation Needs (05/30/2023)  Utilities: Not At Risk (05/30/2023)  Depression (PHQ2-9): Low Risk  (06/29/2020)  Social Connections: Socially Isolated (05/30/2023)  Tobacco Use: Medium Risk (05/31/2023)    Readmission Risk Interventions    05/15/2023   11:59 AM 01/28/2022   11:32 AM  Readmission Risk Prevention Plan  Post Dischage Appt Complete Complete  Medication Screening Complete Complete  Transportation Screening Complete Complete

## 2023-06-02 NOTE — Progress Notes (Addendum)
  Progress Note    06/02/2023 8:20 AM 2 Days Post-Op  Subjective:  says she is " neither bad nor good" this morning. Granddaughter encouraging her to stay ahead of her pain as she has not been asking for pain medication   Vitals:   06/02/23 0435 06/02/23 0742  BP: (!) 186/59 (!) 158/52  Pulse:  74  Resp:  19  Temp: 99 F (37.2 C) 98.8 F (37.1 C)  SpO2:  91%   Physical Exam: Cardiac:  regular Lungs:  non labored Incisions:  right groin with VAC to suction, right AKA dressings c/d/i Abdomen:  soft Neurologic: alert and oriented  CBC    Component Value Date/Time   WBC 22.0 (H) 06/02/2023 0500   RBC 3.48 (L) 06/02/2023 0500   HGB 10.2 (L) 06/02/2023 0500   HGB 14.2 03/23/2021 0928   HCT 30.7 (L) 06/02/2023 0500   HCT 42.1 03/23/2021 0928   PLT 289 06/02/2023 0500   PLT 135 (L) 03/23/2021 0928   MCV 88.2 06/02/2023 0500   MCV 93 03/23/2021 0928   MCH 29.3 06/02/2023 0500   MCHC 33.2 06/02/2023 0500   RDW 15.3 06/02/2023 0500   RDW 12.0 03/23/2021 0928   LYMPHSABS 1.5 05/28/2023 2120   LYMPHSABS 1.9 03/23/2021 0928   MONOABS 0.9 05/28/2023 2120   EOSABS 0.0 05/28/2023 2120   EOSABS 0.3 03/23/2021 0928   BASOSABS 0.1 05/28/2023 2120   BASOSABS 0.0 03/23/2021 0928    BMET    Component Value Date/Time   NA 134 (L) 06/02/2023 0500   NA 138 01/15/2020 1128   K 4.4 06/02/2023 0500   CL 103 06/02/2023 0500   CO2 25 06/02/2023 0500   GLUCOSE 87 06/02/2023 0500   BUN <5 (L) 06/02/2023 0500   BUN 8 01/15/2020 1128   CREATININE 0.73 06/02/2023 0500   CALCIUM  8.4 (L) 06/02/2023 0500   GFRNONAA >60 06/02/2023 0500   GFRAA 80 01/15/2020 1128    INR    Component Value Date/Time   INR 1.5 (H) 05/29/2023 0030     Intake/Output Summary (Last 24 hours) at 06/02/2023 0820 Last data filed at 06/02/2023 0700 Gross per 24 hour  Intake 1030.2 ml  Output 3820 ml  Net -2789.8 ml     Assessment/Plan:  68 y.o. female is s/p #1: I&D right groin wound  #2: Sartorius  muscle flap, right groin #3: Placement of first 38 cm skin substitute (Kerecis) #4: Placement of antibiotic impregnated beads 5: Wound VAC, right groin (10 x 6 x 2) #6: Right above-knee amputation 2 Days Post op  Right groin with VAC to suction Right AKA dressings c/d/I  Pain control as needed H&H stable 6 weeks IV Abx with Daptomycin  per ID Likely VAC change early next week Stump dressings will take down tomorrow    Deneen Finical, PA-C [facility] [phone] 06/02/2023 8:20 AM   I agree with the above.  Daughter at bedside.  I have again stressed the importance of routine pain medication and not waiting until she is hurting.  Appreciate ID assistance.  They are recommending 6 weeks of IV antibiotics.  Plan for stump dressing changed tomorrow and wound VAC replacement on Monday using the peel in place device  Wells Laconya Clere

## 2023-06-02 NOTE — Progress Notes (Signed)
  Progress Note   Patient: Katherine Archer UJW:[id number] DOB: 15-Jan-1955 DOA: 05/28/2023     4 DOS: the patient was seen and examined on 06/02/2023 at 8:31AM and 2:15PM      Brief hospital course: 68 y.o. F with HTN, HLD, PVD with hx axillo-fem bypass in 2021, fem-pop bypass Jan 2024 and recent admission earlier this month for critical limb ischemia requiring repeat fem-pop bypass with PTFE who presented with severe pain and swelling of operative leg, purulent drainage, and Hgb 4.4 g/dL.  Admitted to ICU with septic shock on pressors, blood transfusion.    Underwent excision of her previous femoral grafts on 5/26 by Dr. Rosalva Comber.  Underwent AKA and wound flap placement on 5/28 by Dr. Charlotte Cookey.  Transferred OOU on 5/29    Assessment and Plan: Septic shock due to group A strep bacteremia and MRSA graft infection due to postsurgical femoropopliteal bypass graft infection Blood cultures on admission with GAS.  Intraoperative cultures from 5/26 positive for MRSA.  Repeat blood cultures 5/27 NGTD - Continue daptomycin  - EOT 7/9 - Transition to doxycycline, penicillin  for 2 months after then, then doxycyline suppression indefinitely   Acute blood loss anemia Hgb 4.4 on admission, trnasfused 3 units and stable since.     Peripheral vascular disease - Stop Eliquis  - Plan for aspirin  long term - Continue Crestor , Zetia   Hyperlipidemia Hypertension BP elevated due to pain - Continue hydralazine , irbesartan           Subjective: Pain tolerable, no dyspnea, no bleeding, no few confusion or respiratory symptoms.     Physical Exam: BP (!) 176/48 (BP Location: Left Arm)   Pulse 77   Temp 98.3 F (36.8 C) (Oral)   Resp 20   Ht 5\' 6"  (1.676 m)   Wt 70.8 kg   SpO2 96%   BMI 25.19 kg/m   Adult female, lying in bed, weak and tired RRR, no murmurs appreciated, no peripheral edema in the left leg Respiratory rate normal, lungs clear without rales or wheezes Abdomen soft, no  tenderness palpation or guarding There is no redness surrounding her right groin wound VAC the stump is wrapped face symmetric, speech fluent All history collected to interpreter    Data Reviewed: CBC shows white count of 22, hemoglobin stable at 10 Basic metabolic panel unremarkable, albumin  low  Family Communication: Granddaughter at the bedside    Disposition: Status is: Inpatient         Author: Ephriam Hashimoto, MD 06/02/2023 3:00 PM  For on call review [email or web address].

## 2023-06-02 NOTE — Plan of Care (Signed)

## 2023-06-03 DIAGNOSIS — A419 Sepsis, unspecified organism: Secondary | ICD-10-CM | POA: Diagnosis not present

## 2023-06-03 DIAGNOSIS — R6521 Severe sepsis with septic shock: Secondary | ICD-10-CM | POA: Diagnosis not present

## 2023-06-03 LAB — RENAL FUNCTION PANEL
Albumin: 1.7 g/dL — ABNORMAL LOW (ref 3.5–5.0)
Anion gap: 6 (ref 5–15)
BUN: 6 mg/dL — ABNORMAL LOW (ref 8–23)
CO2: 25 mmol/L (ref 22–32)
Calcium: 8.1 mg/dL — ABNORMAL LOW (ref 8.9–10.3)
Chloride: 102 mmol/L (ref 98–111)
Creatinine, Ser: 0.64 mg/dL (ref 0.44–1.00)
GFR, Estimated: >60 mL/min (ref >60)
Glucose, Bld: 87 mg/dL (ref 70–99)
Phosphorus: 3.4 mg/dL (ref 2.5–4.6)
Potassium: 4.1 mmol/L (ref 3.5–5.1)
Sodium: 133 mmol/L — ABNORMAL LOW (ref 135–145)

## 2023-06-03 LAB — CBC
HCT: 30.5 % — ABNORMAL LOW (ref 36.0–46.0)
Hemoglobin: 10.2 g/dL — ABNORMAL LOW (ref 12.0–15.0)
MCH: 29.6 pg (ref 26.0–34.0)
MCHC: 33.4 g/dL (ref 30.0–36.0)
MCV: 88.4 fL (ref 80.0–100.0)
Platelets: 303 10*3/uL (ref 150–400)
RBC: 3.45 MIL/uL — ABNORMAL LOW (ref 3.87–5.11)
RDW: 15.4 % (ref 11.5–15.5)
WBC: 22.3 10*3/uL — ABNORMAL HIGH (ref 4.0–10.5)
nRBC: 0.1 % (ref 0.0–0.2)

## 2023-06-03 LAB — GLUCOSE, CAPILLARY
Glucose-Capillary: 112 mg/dL — ABNORMAL HIGH (ref 70–99)
Glucose-Capillary: 117 mg/dL — ABNORMAL HIGH (ref 70–99)
Glucose-Capillary: 129 mg/dL — ABNORMAL HIGH (ref 70–99)
Glucose-Capillary: 91 mg/dL (ref 70–99)
Glucose-Capillary: 95 mg/dL (ref 70–99)

## 2023-06-03 MED ORDER — AMLODIPINE BESYLATE 5 MG PO TABS
5.0000 mg | ORAL_TABLET | Freq: Every day | ORAL | Status: DC
  Administered 2023-06-03 – 2023-06-05 (×3): 5 mg via ORAL
  Filled 2023-06-03 (×3): qty 1

## 2023-06-03 NOTE — Progress Notes (Signed)
 Inpatient Rehab Admissions Coordinator Note:   Per PT patient was screened for CIR candidacy by Santino Kinsella Annell Barrow, CCC-SLP. At this time, pt appears to be a potential candidate for CIR. I will place an order for rehab consult for full assessment, per our protocol.  Please contact me any with questions.Artemus Larsen, MS, CCC-SLP Admissions Coordinator [phone] 06/03/23 1:35 PM

## 2023-06-03 NOTE — Progress Notes (Signed)
 Patient admitted from [facility]  alert and oriented accompanied by RN and patient family,cardiac monitoring applied,CCMD notified and V/S checked. Patient and familly oriented to the room and staff.

## 2023-06-03 NOTE — Evaluation (Signed)
 Physical Therapy Evaluation Patient Details Name: Katherine Archer MRN: [id number] DOB: 09-20-1955 Today's Date: 06/03/2023  History of Present Illness  68 y.o. female presents to [facility] 05/28/23 with worsening RLE pain/swelling and dizziness, s/p R fem-pop BGD on 5/8. Admitted w/ septic shock 2/2 infected R fem pop bypass and anemia. 5/26 R LE graft excision and washout. 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 ICU stay 5/26-5/30. PMHx: PAD, axillobifemoral bypass, multiple RLE procedures, stroke, HTN, HLD.   Clinical Impression  Pt in bed upon arrival and agreeable to PT eval. PTA, pt was ModI with either SP cane or RW for mobility. In today's session, pt required ModA for bed mobility and MinAx2 for safety to stand and perform pivot to recliner with RW. Pt initially used heel/toe method to pivot with progression to taking small hops. Pt and family are in the process of working to have 24/7 physical assist available upon return home. Anticipate pt will progress well due to strong family support, high level of motivation, and active PLOF. Recommending post-acute rehab >3hrs to work towards independence with mobility. Pt would benefit from acute skilled PT with current functional limitations listed below (see PT Problem List). Acute PT to follow.         If plan is discharge home, recommend the following: A lot of help with walking and/or transfers;A lot of help with bathing/dressing/bathroom;Assistance with cooking/housework;Assist for transportation;Help with stairs or ramp for entrance   Can travel by private vehicle    Yes    Equipment Recommendations Wheelchair (measurements PT);Wheelchair cushion (measurements PT) (wants to trial crutches)     Functional Status Assessment Patient has had a recent decline in their functional status and demonstrates the ability to make significant improvements in function in a reasonable and predictable amount of time.     Precautions / Restrictions Precautions Precautions:  Fall Precaution/Restrictions Comments: R AKA, wound vac Restrictions Weight Bearing Restrictions Per Provider Order: Yes RLE Weight Bearing Per Provider Order: Non weight bearing      Mobility  Bed Mobility Overal bed mobility: Needs Assistance Bed Mobility: Supine to Sit    Supine to sit: Mod assist    General bed mobility comments: ModA to shift hips towards EOB with bed pad. Pt able to move LE's towards EOB with slight assist for trunk elevation    Transfers Overall transfer level: Needs assistance Equipment used: Rolling walker (2 wheels) Transfers: Sit to/from Stand, Bed to chair/wheelchair/BSC Sit to Stand: Min assist, +2 safety/equipment   Step pivot transfers: Min assist, +2 safety/equipment     General transfer comment: MinA to boost-up and steady with increased time and effort. Initially pivoted using heel/toe method with progression to small hops with RW. x2 for safety      Balance Overall balance assessment: Mild deficits observed, not formally tested          Pertinent Vitals/Pain Pain Assessment Pain Assessment: Faces Faces Pain Scale: Hurts even more Pain Location: abdomen and R stump Pain Descriptors / Indicators: Grimacing, Guarding, Sore Pain Intervention(s): Limited activity within patient's tolerance, Monitored during session, Repositioned    Home Living Family/patient expects to be discharged to:: Private residence Living Arrangements: Children;Other relatives (daughter, grandaughter) Available Help at Discharge: Family;Available 24 hours/day Type of Home: Mobile home Home Access: Stairs to enter Entrance Stairs-Rails: Right;Left;Can reach both Entrance Stairs-Number of Steps: 5   Home Layout: One level Home Equipment: Cane - single Librarian, academic (2 wheels)      Prior Function Prior Level of Function :  Independent/Modified Independent    Mobility Comments: Alternates between using SPC and RW. Reports no recent falls. ADLs  Comments: Pt typically Independent with ADLs. Pt reports often taking a sponge bath due to the tub/shower bring small. Has family assist with driving     Extremity/Trunk Assessment   Upper Extremity Assessment Upper Extremity Assessment: Defer to OT evaluation    Lower Extremity Assessment Lower Extremity Assessment: RLE deficits/detail;LLE deficits/detail RLE Deficits / Details: R AKA, Good quad contraction and ability to SLR RLE Sensation: WNL LLE Deficits / Details: Grossly 4+/5 LLE Sensation: WNL    Cervical / Trunk Assessment Cervical / Trunk Assessment: Normal  Communication   Communication Communication: No apparent difficulties;Other (comment) Factors Affecting Communication: Non - English speaking, interpreter not available (daughter interpreted)    Cognition Arousal: Alert Behavior During Therapy: WFL for tasks assessed/performed   PT - Cognitive impairments: No apparent impairments    Following commands: Intact       Cueing Cueing Techniques: Verbal cues, Tactile cues     General Comments General comments (skin integrity, edema, etc.): Dressing over R AKA dry and intact. Family present and supportive during session    Exercises Other Exercises Other Exercises: Discussed R AKA exercises with HEP given- chair push up, sidelying hip flexion/extension/ABD, SLR and quad set   Assessment/Plan    PT Assessment Patient needs continued PT services  PT Problem List Decreased range of motion;Decreased activity tolerance;Decreased balance;Decreased mobility;Decreased knowledge of use of DME;Decreased strength       PT Treatment Interventions DME instruction;Gait training;Stair training;Functional mobility training;Therapeutic activities;Therapeutic exercise;Patient/family education    PT Goals (Current goals can be found in the Care Plan section)  Acute Rehab PT Goals Patient Stated Goal: to be able to ambualte with crutches PT Goal Formulation: With  patient/family Time For Goal Achievement: 06/17/23 Potential to Achieve Goals: Good    Frequency Min 3X/week        AM-PAC PT "6 Clicks" Mobility  Outcome Measure Help needed turning from your back to your side while in a flat bed without using bedrails?: A Little Help needed moving from lying on your back to sitting on the side of a flat bed without using bedrails?: A Lot Help needed moving to and from a bed to a chair (including a wheelchair)?: A Little Help needed standing up from a chair using your arms (e.g., wheelchair or bedside chair)?: A Little Help needed to walk in hospital room?: A Lot Help needed climbing 3-5 steps with a railing? : Total 6 Click Score: 14    End of Session Equipment Utilized During Treatment: Gait belt Activity Tolerance: Patient tolerated treatment well Patient left: in chair;with call bell/phone within reach;with family/visitor present Nurse Communication: Mobility status PT Visit Diagnosis: Other abnormalities of gait and mobility (R26.89);Unsteadiness on feet (R26.81);Muscle weakness (generalized) (M62.81)    Time: 1610-9604 PT Time Calculation (min) (ACUTE ONLY): 40 min   Charges:   PT Evaluation $PT Eval Low Complexity: 1 Low PT Treatments $Therapeutic Activity: 8-22 mins PT General Charges $$ ACUTE PT VISIT: 1 Visit       Katherine Archer, PT, DPT Secure Chat Preferred  Rehab Office [phone]   Katherine Archer 06/03/2023, 1:26 PM

## 2023-06-03 NOTE — Plan of Care (Signed)

## 2023-06-03 NOTE — Progress Notes (Signed)
  Progress Note   Patient: Katherine Archer ZOX:[id number] DOB: 07/05/1955 DOA: 05/28/2023     5 DOS: the patient was seen and examined on 06/03/2023 at 8:31AM and 2:15PM      Brief hospital course: 68 y.o. F with HTN, HLD, PVD with hx axillo-fem bypass in 2021, fem-pop bypass Jan 2024 and recent admission earlier this month for critical limb ischemia requiring repeat fem-pop bypass with PTFE who presented with severe pain and swelling of operative leg, purulent drainage, and Hgb 4.4 g/dL.  Admitted to ICU with septic shock on pressors, blood transfusion.    Underwent excision of her previous femoral grafts on 5/26 by Dr. Rosalva Comber.  Underwent AKA and wound flap placement on 5/28 by Dr. Charlotte Cookey.  Transferred OOU on 5/29    Assessment and Plan: Septic shock due to group A strep bacteremia and MRSA graft infection due to postsurgical femoropopliteal bypass graft infection Blood cultures on admission with GAS.  Intraoperative cultures from 5/26 positive for MRSA.  Repeat blood cultures 5/27 NGTD - Continue daptomycin  - EOT 7/9 - Transition to doxycycline, penicillin  for 2 months after then, then doxycyline suppression indefinitely   Acute blood loss anemia Hgb 4.4 on admission, transfused 3 units and stable since.     Peripheral vascular disease Eliquis  stopped this admission.  This was indicated for critical limb ischemia, but status post amputation is no longer required. - Plan for aspirin  long term - Continue Crestor , Zetia   Hyperlipidemia Hypertension Blood pressure elevated - Add amlodipine  - Continue hydralazine , irbesartan           Subjective: Pain improved.  No chest discomfort, shortness of breath, malaise.    Physical Exam: BP (!) 184/57 (BP Location: Left Arm)   Pulse 86   Temp 98.6 F (37 C) (Oral)   Resp (!) 23   Ht 5\' 6"  (1.676 m)   Wt 70.8 kg   SpO2 97%   BMI 25.19 kg/m   Adult female, lying in bed, appears comfortable RRR, no murmurs, no peripheral  edema in the left leg Respiratory rate normal, lungs clear, no wheezes or rales Abdomen soft no tenderness palpation or guarding, no ascites or distention Attention normal, affect pleasant, judgment and insight appear normal All history collected through video phonic interpreter [phone]    Data Reviewed: CBC and basic metabolic panel reviewed, creatinine stable  Family Communication: Daughter at the bedside    Disposition: Status is: Inpatient         Author: Ephriam Hashimoto, MD 06/03/2023 2:03 PM  For on call review [email or web address].

## 2023-06-03 NOTE — Progress Notes (Addendum)
 [facility] [place]  Subjective  - Daughter in room she does not speak [place].  Doing well over all, no new complaitns   Objective (!) 164/56 71 99.5 F (37.5 C) (Oral) 20 95%  Intake/Output Summary (Last 24 hours) at 06/03/2023 0723 Last data filed at 06/03/2023 0500 Gross per 24 hour  Intake 440.4 ml  Output 700 ml  Net -259.6 ml       Slight darkening of skin anterior thigh and stump incision.  Will observe.  Ischemia verses ecchymosis Stump warm   Assessment/Planning: Infected right LE bypass graft S/P #1: I&D right groin wound #2: Sartorius muscle flap, right groin #3: Placement of first 38 cm skin substitute (Kerecis) #4: Placement of antibiotic impregnated beads 5: Wound VAC, right groin (10 x 6 x 2) #6: Right above-knee amputation   Vac to good suction, plan for vac change next week Right AKA dressing taken down.  I did not reapply the ace wrap today will change dressing again tomorrow to chek skin.   6 weeks IV Abx with Daptomycin  per ID  Pain control  Rocky Cipro 06/03/2023 7:23 AM --  Laboratory Lab Results: Recent Labs    06/02/23 0500 06/03/23 0415  WBC 22.0* 22.3*  HGB 10.2* 10.2*  HCT 30.7* 30.5*  PLT 289 303   BMET Recent Labs    06/02/23 0500 06/03/23 0415  NA 134* 133*  K 4.4 4.1  CL 103 102  CO2 25 25  GLUCOSE 87 87  BUN <5* 6*  CREATININE 0.73 0.64  CALCIUM  8.4* 8.1*    COAG Lab Results  Component Value Date   INR 1.5 (H) 05/29/2023   INR 1.1 05/11/2023   INR 1.0 01/24/2022   No results found for: "PTT"   I have seen and evaluated the patient. I agree with the PA note as documented above.  Plan VAC change to the right groin on Monday.  Dressing changed to the right AKA today.  On daptomycin  for rare MRSA and strep from the wound.  Young Hensen, MD [facility] [place] Office: [phone]

## 2023-06-03 NOTE — Progress Notes (Signed)
 Inpatient Rehab Admissions:  Inpatient Rehab Consult received.  I met with patient and daughter Renold Cashing at the bedside for rehabilitation assessment and to discuss goals and expectations of an inpatient rehab admission.  Discussed average length of stay, insurance authorization requirement and discharge home after completion of CIR. Both acknowledged understanding. Pt interested in CIR and Renold Cashing is supportive. Renold Cashing informed AC she is unsure if family will be able to provide 24/7 support after discharge. She is going to discuss support with family. AC will follow on Monday.   Signed: Artemus Larsen, MS, CCC-SLP Admissions Coordinator [phone]

## 2023-06-04 DIAGNOSIS — D649 Anemia, unspecified: Secondary | ICD-10-CM | POA: Insufficient documentation

## 2023-06-04 DIAGNOSIS — I739 Peripheral vascular disease, unspecified: Secondary | ICD-10-CM | POA: Diagnosis not present

## 2023-06-04 DIAGNOSIS — A419 Sepsis, unspecified organism: Secondary | ICD-10-CM | POA: Diagnosis not present

## 2023-06-04 DIAGNOSIS — R6521 Severe sepsis with septic shock: Secondary | ICD-10-CM | POA: Diagnosis not present

## 2023-06-04 DIAGNOSIS — I70221 Atherosclerosis of native arteries of extremities with rest pain, right leg: Secondary | ICD-10-CM | POA: Diagnosis not present

## 2023-06-04 HISTORY — DX: Anemia, unspecified: D64.9

## 2023-06-04 LAB — RENAL FUNCTION PANEL
Albumin: 1.7 g/dL — ABNORMAL LOW (ref 3.5–5.0)
Anion gap: 4 — ABNORMAL LOW (ref 5–15)
BUN: 6 mg/dL — ABNORMAL LOW (ref 8–23)
CO2: 25 mmol/L (ref 22–32)
Calcium: 8.4 mg/dL — ABNORMAL LOW (ref 8.9–10.3)
Chloride: 101 mmol/L (ref 98–111)
Creatinine, Ser: 0.68 mg/dL (ref 0.44–1.00)
GFR, Estimated: >60 mL/min (ref >60)
Glucose, Bld: 93 mg/dL (ref 70–99)
Phosphorus: 2.7 mg/dL (ref 2.5–4.6)
Potassium: 4 mmol/L (ref 3.5–5.1)
Sodium: 130 mmol/L — ABNORMAL LOW (ref 135–145)

## 2023-06-04 LAB — GLUCOSE, CAPILLARY
Glucose-Capillary: 88 mg/dL (ref 70–99)
Glucose-Capillary: 94 mg/dL (ref 70–99)
Glucose-Capillary: 102 mg/dL — ABNORMAL HIGH (ref 70–99)
Glucose-Capillary: 96 mg/dL (ref 70–99)

## 2023-06-04 LAB — CULTURE, BLOOD (ROUTINE X 2)
Culture: NO GROWTH
Culture: NO GROWTH
Special Requests: ADEQUATE
Special Requests: ADEQUATE

## 2023-06-04 MED ORDER — HYDROCHLOROTHIAZIDE 12.5 MG PO TABS
12.5000 mg | ORAL_TABLET | Freq: Every day | ORAL | Status: DC
  Administered 2023-06-04 – 2023-06-07 (×4): 12.5 mg via ORAL
  Filled 2023-06-04 (×4): qty 1

## 2023-06-04 MED ORDER — HYDROMORPHONE HCL 1 MG/ML IJ SOLN
0.5000 mg | Freq: Once | INTRAMUSCULAR | Status: AC
  Administered 2023-06-04: 0.5 mg via INTRAVENOUS
  Filled 2023-06-04: qty 0.5

## 2023-06-04 MED ORDER — ASPIRIN 81 MG PO TBEC: 81.0000 mg | DELAYED_RELEASE_TABLET | Freq: Every day | ORAL | Status: DC

## 2023-06-04 MED ORDER — ASPIRIN 81 MG PO TBEC
81.0000 mg | DELAYED_RELEASE_TABLET | Freq: Every day | ORAL | Status: DC
  Administered 2023-06-04 – 2023-06-07 (×4): 81 mg via ORAL
  Filled 2023-06-04 (×4): qty 1

## 2023-06-04 NOTE — Assessment & Plan Note (Addendum)
 S/p Above knee amputation  Eliquis  stopped this admission.  This was indicated for critical limb ischemia, but status post amputation is no longer required.  There is some skin changes to the stump today, concern for ischemic change. - Start aspirin  - Continue Crestor , Zetia 

## 2023-06-04 NOTE — Assessment & Plan Note (Signed)
 Hyperlipidemia BP still elevated - New amlodipine  - Continue hydralazine , irbesartan  - Add HCTZ

## 2023-06-04 NOTE — Assessment & Plan Note (Addendum)
 Vascular graft infection Blood cultures on admission with GAS.  Intraoperative cultures from 5/26 positive for MRSA.   Repeat blood cultures 5/27 NGTD - Continue daptomycin  - EOT 7/9 - Transition to doxycycline, penicillin  for 2 months after then, then doxycyline suppression indefinitely

## 2023-06-04 NOTE — Assessment & Plan Note (Signed)
 Hgb 4.4 on admission, etiology probably unclear.  Transfused 3u PRBCs prior to surgery and stable since.

## 2023-06-04 NOTE — Hospital Course (Signed)
 68 y.o. F with HTN, HLD, PVD with hx axillo-fem bypass in 2021, fem-pop bypass Jan 2024 and recent admission earlier this month for critical limb ischemia requiring repeat fem-pop bypass with PTFE who presented with severe pain and swelling of operative leg, purulent drainage, and Hgb 4.4 g/dL.  Admitted to ICU with septic shock on pressors, blood transfusion.     Underwent excision of her previous femoral grafts on 5/26 by Dr. Rosalva Comber.  Underwent AKA and wound flap placement on 5/28 by Dr. Charlotte Cookey.   Transferred OOU on 5/29

## 2023-06-04 NOTE — Progress Notes (Addendum)
 [facility]   Subjective  - Feels ok over all, no new complaints   Objective (!) 168/58 77 98.9 F (37.2 C) (Oral) (!) 21 95%  Intake/Output Summary (Last 24 hours) at 06/04/2023 0714 Last data filed at 06/04/2023 0300 Gross per 24 hour  Intake 390 ml  Output 340 ml  Net 50 ml       Superficial medial thigh skin changes and medial incision on stump.  Will observe for now.  Stump left open to air. Vacv to good suction 200 OP total bloody drainage   Assessment/Planning: Infected right LE bypass graft S/P #1: I&D right groin wound #2: Sartorius muscle flap, right groin #3: Placement of first 38 cm skin substitute (Kerecis) #4: Placement of antibiotic impregnated beads 5: Wound VAC, right groin (10 x 6 x 2) #6: Right above-knee amputation   Vac with good suction.  VAC replacement on Monday using the peel in place device per Brabham's note. Early superficial ischemic skin change medial thigh and medial incision.  Will observe. 6 weeks IV Abx with Daptomycin  per ID  Pain control  Rocky Cipro 06/04/2023 7:14 AM --  Laboratory Lab Results: Recent Labs    06/02/23 0500 06/03/23 0415  WBC 22.0* 22.3*  HGB 10.2* 10.2*  HCT 30.7* 30.5*  PLT 289 303   BMET Recent Labs    06/03/23 0415 06/04/23 0620  NA 133* 130*  K 4.1 4.0  CL 102 101  CO2 25 25  GLUCOSE 87 93  BUN 6* 6*  CREATININE 0.64 0.68  CALCIUM  8.1* 8.4*    COAG Lab Results  Component Value Date   INR 1.5 (H) 05/29/2023   INR 1.1 05/11/2023   INR 1.0 01/24/2022   No results found for: "PTT"   I have seen and evaluated the patient. I agree with the PA note as documented above.  VAC change right groin tomorrow.  Discussed with daughters will need to closely monitor her right above-knee amputation stump as it does have some early ischemic changes.  Young Hensen, MD [facility] [place] Office: [phone]

## 2023-06-04 NOTE — Progress Notes (Signed)
  Progress Note   Patient: Katherine Archer EXB:[id number] DOB: 1955/12/07 DOA: 05/28/2023     6 DOS: the patient was seen and examined on 06/04/2023 at 8:20AM      Brief hospital course: 68 y.o. F with HTN, HLD, PVD with hx axillo-fem bypass in 2021, fem-pop bypass Jan 2024 and recent admission earlier this month for critical limb ischemia requiring repeat fem-pop bypass with PTFE who presented with severe pain and swelling of operative leg, purulent drainage, and Hgb 4.4 g/dL.  Admitted to ICU with septic shock on pressors, blood transfusion.     Underwent excision of her previous femoral grafts on 5/26 by Dr. Rosalva Comber.  Underwent AKA and wound flap placement on 5/28 by Dr. Charlotte Cookey.   Transferred OOU on 5/29     Assessment and Plan: * Septic shock due to group A strep bacteremia and MRSA graft infection due to postsurgical femoropopliteal bypass graft infection (HCC) Vascular graft infection Blood cultures on admission with GAS.  Intraoperative cultures from 5/26 positive for MRSA.   Repeat blood cultures 5/27 NGTD - Continue daptomycin  - EOT 7/9 - Transition to doxycycline, penicillin  for 2 months after then, then doxycyline suppression indefinitely    PAD (peripheral artery disease) (HCC) S/p Above knee amputation  Eliquis  stopped this admission.  This was indicated for critical limb ischemia, but status post amputation is no longer required.  There is some skin changes to the stump today, concern for ischemic change. - Start aspirin  - Continue Crestor , Zetia   Normocytic anemia Hgb 4.4 on admission, etiology probably unclear.  Transfused 3u PRBCs prior to surgery and stable since.  Hypertension Hyperlipidemia BP still elevated - New amlodipine  - Continue hydralazine , irbesartan  - Add HCTZ  Hyponatremia Mild, asymptomatic - Monitor on new HCTZ        Subjective: Feels well.  Darkening of skin on stump noted.     Physical Exam: BP (!) 161/61 (BP Location: Left  Arm)   Pulse 95   Temp 98.3 F (36.8 C) (Oral)   Resp 20   Ht 5\' 6"  (1.676 m)   Wt 70.8 kg   SpO2 97%   BMI 25.19 kg/m   Adult female, sitting up in bed, interactive and appropriate RRR, no murmurs, no peripheral edema Respiratory rate normal, lungs clear without rales or wheezes Abdomen soft no tenderness palpation or guarding, no ascites or distention Attention normal, affect pleasant, judgment insight appear normal All history collected to interpreter at bedside, daughter is a Chemical engineer, requested to interpret    Data Reviewed: Basic metabolic panel shows hyponatremia, normal renal function CBC normal  Family Communication: Daughter is at the bedside    Disposition: Status is: Inpatient         Author: Ephriam Hashimoto, MD 06/04/2023 1:40 PM  For on call review [email or web address].

## 2023-06-04 NOTE — Evaluation (Signed)
 Occupational Therapy Evaluation Patient Details Name: Katherine Archer MRN: [id number] DOB: 1955/06/13 Today's Date: 06/04/2023   History of Present Illness   68 y.o. female presents to [facility]/28/23 with worsening RLE pain/swelling and dizziness, s/p R fem-pop BGD on 5/8. Admitted w/ septic shock 2/2 infected R fem pop bypass and anemia. 5/26 R LE graft excision and washout. 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 ICU stay 5/26-5/30. PMHx: PAD, axillobifemoral bypass, multiple RLE procedures, stroke, HTN, HLD.     Clinical Impressions Pt reports ind at baseline with ADLs/functional mobility, lives with her daughter. Pt currently needing up to mod A for ADLs, min A for bed mobility and mod A for pivot transfer with RW. Pt able to hop ~5 steps forward toward [facility] with close chair follow. Pt educated on limb desensitization strategies for phantom limb pain. Pt presenting with impairments listed below, will follow acutely. Patient will benefit from intensive inpatient follow-up therapy, >3 hours/day to maximize safety/ind with ADL/functional mobility.      If plan is discharge home, recommend the following:   A little help with walking and/or transfers;Assistance with cooking/housework;Assist for transportation;Help with stairs or ramp for entrance;A lot of help with bathing/dressing/bathroom     Functional Status Assessment   Patient has had a recent decline in their functional status and demonstrates the ability to make significant improvements in function in a reasonable and predictable amount of time.     Equipment Recommendations   Other (comment) (defer)     Recommendations for Other Services   PT consult     Precautions/Restrictions   Precautions Precautions: Fall Precaution/Restrictions Comments: R AKA, wound vac Restrictions Weight Bearing Restrictions Per Provider Order: Yes RLE Weight Bearing Per Provider Order: Non weight bearing     Mobility Bed Mobility Overal bed  mobility: Needs Assistance Bed Mobility: Supine to Sit     Supine to sit: Min assist          Transfers Overall transfer level: Needs assistance Equipment used: Rolling walker (2 wheels) Transfers: Sit to/from Stand, Bed to chair/wheelchair/BSC Sit to Stand: Mod assist           General transfer comment: x2 and takes 4-5 hops forward with close chair follow      Balance Overall balance assessment: Mild deficits observed, not formally tested                                         ADL either performed or assessed with clinical judgement   ADL Overall ADL's : Needs assistance/impaired Eating/Feeding: Set up;Sitting   Grooming: Set up;Sitting   Upper Body Bathing: Minimal assistance;Sitting   Lower Body Bathing: Minimal assistance;Sitting/lateral leans;Moderate assistance   Upper Body Dressing : Minimal assistance;Sitting   Lower Body Dressing: Minimal assistance;Moderate assistance   Toilet Transfer: Moderate assistance;Stand-pivot;BSC/3in1;Rolling walker (2 wheels)   Toileting- Clothing Manipulation and Hygiene: Contact guard assist       Functional mobility during ADLs: Moderate assistance;Rolling walker (2 wheels)       Vision   Vision Assessment?: No apparent visual deficits     Perception Perception: Not tested       Praxis Praxis: Not tested       Pertinent Vitals/Pain Pain Assessment Pain Assessment: No/denies pain     Extremity/Trunk Assessment Upper Extremity Assessment Upper Extremity Assessment: Overall WFL for tasks assessed   Lower Extremity Assessment Lower Extremity Assessment: Defer to  PT evaluation   Cervical / Trunk Assessment Cervical / Trunk Assessment: Normal   Communication Communication Communication: No apparent difficulties;Other (comment) Factors Affecting Communication: Non - English speaking, interpreter not available (family interpreting)   Cognition Arousal: Alert Behavior During Therapy:  WFL for tasks assessed/performed Cognition: No apparent impairments                               Following commands: Intact       Cueing  General Comments   Cueing Techniques: Verbal cues;Tactile cues  VSS   Exercises     Shoulder Instructions      Home Living Family/patient expects to be discharged to:: Private residence Living Arrangements: Children;Other relatives (daughter and grandson) Available Help at Discharge: Family;Available 24 hours/day Type of Home: Mobile home Home Access: Stairs to enter Entrance Stairs-Number of Steps: 5 Entrance Stairs-Rails: Right;Left;Can reach both Home Layout: One level     Bathroom Shower/Tub: Producer, television/film/video: Standard Bathroom Accessibility: Yes   Home Equipment: Cane - single point;Rolling Walker (2 wheels)          Prior Functioning/Environment Prior Level of Function : Independent/Modified Independent             Mobility Comments: Alternates between using SPC and RW. Reports no recent falls. ADLs Comments: Pt typically Independent with ADLs. Pt reports often taking a sponge bath due to the tub/shower bring small. Has family assist with driving    OT Problem List: Decreased activity tolerance;Impaired balance (sitting and/or standing);Decreased strength;Decreased range of motion;Decreased safety awareness   OT Treatment/Interventions: Self-care/ADL training;Energy conservation;DME and/or AE instruction;Therapeutic activities;Patient/family education;Balance training      OT Goals(Current goals can be found in the care plan section)   Acute Rehab OT Goals Patient Stated Goal: none stated OT Goal Formulation: With patient Time For Goal Achievement: 06/18/23 Potential to Achieve Goals: Good ADL Goals Pt Will Perform Upper Body Dressing: with modified independence;sitting Pt Will Perform Lower Body Dressing: with min assist;sitting/lateral leans;sit to/from stand Pt Will Transfer  to Toilet: with min assist;ambulating;regular height toilet Pt Will Perform Tub/Shower Transfer: Tub transfer;Shower transfer;with modified independence;ambulating   OT Frequency:  Min 2X/week    Co-evaluation              AM-PAC OT "6 Clicks" Daily Activity     Outcome Measure Help from another person eating meals?: None Help from another person taking care of personal grooming?: A Little Help from another person toileting, which includes using toliet, bedpan, or urinal?: A Little Help from another person bathing (including washing, rinsing, drying)?: A Little Help from another person to put on and taking off regular upper body clothing?: A Little Help from another person to put on and taking off regular lower body clothing?: A Little 6 Click Score: 19   End of Session Equipment Utilized During Treatment: Gait belt;Rolling walker (2 wheels) Nurse Communication: Mobility status  Activity Tolerance: Patient tolerated treatment well Patient left: in chair;with call bell/phone within reach  OT Visit Diagnosis: Pain;Unsteadiness on feet (R26.81)                Time: 1914-7829 OT Time Calculation (min): 35 min Charges:  OT General Charges $OT Visit: 1 Visit OT Evaluation $OT Eval Moderate Complexity: 1 Mod OT Treatments $Self Care/Home Management : 8-22 mins  Stephen Baruch K, OTD, OTR/L SecureChat Preferred Acute Rehab [phone]   Antionette Kirks 06/04/2023,  12:31 PM

## 2023-06-05 ENCOUNTER — Ambulatory Visit: Attending: Family Medicine

## 2023-06-05 DIAGNOSIS — R6521 Severe sepsis with septic shock: Secondary | ICD-10-CM | POA: Diagnosis not present

## 2023-06-05 DIAGNOSIS — I739 Peripheral vascular disease, unspecified: Secondary | ICD-10-CM

## 2023-06-05 DIAGNOSIS — Z89611 Acquired absence of right leg above knee: Secondary | ICD-10-CM | POA: Diagnosis not present

## 2023-06-05 DIAGNOSIS — A419 Sepsis, unspecified organism: Secondary | ICD-10-CM | POA: Diagnosis not present

## 2023-06-05 LAB — RENAL FUNCTION PANEL
Albumin: 1.9 g/dL — ABNORMAL LOW (ref 3.5–5.0)
Anion gap: 8 (ref 5–15)
BUN: <5 mg/dL — ABNORMAL LOW (ref 8–23)
CO2: 24 mmol/L (ref 22–32)
Calcium: 8.6 mg/dL — ABNORMAL LOW (ref 8.9–10.3)
Chloride: 98 mmol/L (ref 98–111)
Creatinine, Ser: 0.73 mg/dL (ref 0.44–1.00)
GFR, Estimated: >60 mL/min (ref >60)
Glucose, Bld: 91 mg/dL (ref 70–99)
Phosphorus: 2.8 mg/dL (ref 2.5–4.6)
Potassium: 3.6 mmol/L (ref 3.5–5.1)
Sodium: 130 mmol/L — ABNORMAL LOW (ref 135–145)

## 2023-06-05 LAB — CBC
HCT: 28.6 % — ABNORMAL LOW (ref 36.0–46.0)
Hemoglobin: 9.6 g/dL — ABNORMAL LOW (ref 12.0–15.0)
MCH: 30.3 pg (ref 26.0–34.0)
MCHC: 33.6 g/dL (ref 30.0–36.0)
MCV: 90.2 fL (ref 80.0–100.0)
Platelets: 261 10*3/uL (ref 150–400)
RBC: 3.17 MIL/uL — ABNORMAL LOW (ref 3.87–5.11)
RDW: 15.4 % (ref 11.5–15.5)
WBC: 14.8 10*3/uL — ABNORMAL HIGH (ref 4.0–10.5)
nRBC: 0 % (ref 0.0–0.2)

## 2023-06-05 LAB — MINIMUM INHIBITORY CONC. (1 DRUG)

## 2023-06-05 LAB — GLUCOSE, CAPILLARY
Glucose-Capillary: 88 mg/dL (ref 70–99)
Glucose-Capillary: 91 mg/dL (ref 70–99)
Glucose-Capillary: 111 mg/dL — ABNORMAL HIGH (ref 70–99)
Glucose-Capillary: 114 mg/dL — ABNORMAL HIGH (ref 70–99)
Glucose-Capillary: 103 mg/dL — ABNORMAL HIGH (ref 70–99)

## 2023-06-05 LAB — MIC RESULT

## 2023-06-05 MED ORDER — AMLODIPINE BESYLATE 10 MG PO TABS
10.0000 mg | ORAL_TABLET | Freq: Every day | ORAL | Status: DC
  Administered 2023-06-06 – 2023-06-07 (×2): 10 mg via ORAL
  Filled 2023-06-05 (×2): qty 1

## 2023-06-05 NOTE — Consult Note (Signed)
 WOC Nurse Consult Note: Reason for Consult: apply peel and place dressing to groin Wound type: surgical Pressure Injury POA: NA Measurement: 15 cm x 3 cm x 0.5 cm  Wound bed: dusky, moist,  Drainage (amount, consistency, odor) serosanguinous in cannister Periwound: intact Dressing procedure/placement/frequency:  Removed old NPWT dressing Cleansed wound with normal saline Placed medium peel and place dressing over wound. Sealed NPWT dressing at HG  Patient tolerated procedure well   WOC nurse will continue to provide weekly  NPWT dressing changed due to the complexity of the dressing change.   Gillermo Lack, RN, MSN, [facility] WOC Team

## 2023-06-05 NOTE — Progress Notes (Addendum)
 Inpatient Rehab Admissions Coordinator:  Saw pt and granddaughter, Melody at bedside. Melody speaks [place]. Reviewed CIR goals and expectations. Melody acknowledged understanding. She is supportive of pt pursuing CIR. She confirmed that pt will be going to her house after discharge and that she will be able to perform 24/7 support for pt. Will continue to follow.   16:16: Insurance authorization started.  Artemus Larsen, MS, CCC-SLP Admissions Coordinator [phone]

## 2023-06-05 NOTE — Progress Notes (Addendum)
 Progress Note    06/05/2023 6:49 AM 5 Days Post-Op  Subjective:  no complaints.  Does not have pain.  Wants to see expiration dates on meds given to her.    Tm 99.5 now afebrile  Vitals:   06/05/23 0016 06/05/23 0505  BP: (!) 149/51 (!) 163/57  Pulse: 66 81  Resp: 19 20  Temp: 99.2 F (37.3 C) 98.6 F (37 C)  SpO2: 97% 97%    Physical Exam: General:  no distress Lungs:  non labored Incisions:  right groin with vac in place with good seal Extremities:  right AKA site is unchanged from previous picture yesterday.     CBC    Component Value Date/Time   WBC 22.3 (H) 06/03/2023 0415   RBC 3.45 (L) 06/03/2023 0415   HGB 10.2 (L) 06/03/2023 0415   HGB 14.2 03/23/2021 0928   HCT 30.5 (L) 06/03/2023 0415   HCT 42.1 03/23/2021 0928   PLT 303 06/03/2023 0415   PLT 135 (L) 03/23/2021 0928   MCV 88.4 06/03/2023 0415   MCV 93 03/23/2021 0928   MCH 29.6 06/03/2023 0415   MCHC 33.4 06/03/2023 0415   RDW 15.4 06/03/2023 0415   RDW 12.0 03/23/2021 0928   LYMPHSABS 1.5 05/28/2023 2120   LYMPHSABS 1.9 03/23/2021 0928   MONOABS 0.9 05/28/2023 2120   EOSABS 0.0 05/28/2023 2120   EOSABS 0.3 03/23/2021 0928   BASOSABS 0.1 05/28/2023 2120   BASOSABS 0.0 03/23/2021 0928    BMET    Component Value Date/Time   NA 130 (L) 06/04/2023 0620   NA 138 01/15/2020 1128   K 4.0 06/04/2023 0620   CL 101 06/04/2023 0620   CO2 25 06/04/2023 0620   GLUCOSE 93 06/04/2023 0620   BUN 6 (L) 06/04/2023 0620   BUN 8 01/15/2020 1128   CREATININE 0.68 06/04/2023 0620   CALCIUM  8.4 (L) 06/04/2023 0620   GFRNONAA >60 06/04/2023 0620   GFRAA 80 01/15/2020 1128    INR    Component Value Date/Time   INR 1.5 (H) 05/29/2023 0030     Intake/Output Summary (Last 24 hours) at 06/05/2023 2542 Last data filed at 06/04/2023 1800 Gross per 24 hour  Intake 110 ml  Output 50 ml  Net 60 ml      Assessment/Plan:  68 y.o. female is s/p:  Ligation of the right axillobifemoral bypass graft  limb Excision of the right common femoral artery Excision of right femoral to above-knee popliteal artery bypass with above-knee popliteal artery ligation Right sided greater saphenous vein saphenectomy Excision of the right femoral to below-knee popliteal artery bypass with greater saphenous vein patch Femoral to above-knee popliteal bypass tunnel with 15 French drain Femoral to below-knee popliteal bypass tunnel with 19 French drain Groin with white and blue VAC sponge  05/29/2023 by Dr. Rosalva Comber   And    I&D right groin with Sartorious muscle flap, placement of Kerecis and abx beads and vac placement and right AKA on 05/31/2023 by Dr. Charlotte Cookey  5 Days Post-Op   -right groin wound vac with good seal-will need to be changed today with peel and place wound vac. -right AKA stump appears unchanged from pictures taken yesterday.  Will need to continue to monitor closely.  -pt currently on Daptomycin  with end date of 7/9.  Plan to transition to doxycycline, PCN for 2 months and the doxy for suppression indefinitely. -DVT prophylaxis:  sq heparin    Katherine Smart, [facility] [phone] 06/05/2023 6:49 AM  I have seen and evaluated the patient. I agree with the PA note as documented above.  Right AKA looks stable through the weekend with some blistering that we are watching.  Right VAC changed today.  Continue IV daptomycin .  Young Hensen, MD [facility] [place] Office: [phone]

## 2023-06-05 NOTE — Plan of Care (Signed)

## 2023-06-05 NOTE — Progress Notes (Signed)
  Progress Note   Patient: Katherine Archer MVH:[id number] DOB: 1955/04/08 DOA: 05/28/2023     7 DOS: the patient was seen and examined on 06/05/2023 at 9:31AM      Brief hospital course: 68 y.o. F with HTN, HLD, PVD with hx axillo-fem bypass in 2021, fem-pop bypass Jan 2024 and recent admission earlier this month for critical limb ischemia requiring repeat fem-pop bypass with PTFE who presented with severe pain and swelling of operative leg, purulent drainage, and Hgb 4.4 g/dL.  Admitted to ICU with septic shock on pressors, blood transfusion.     Underwent excision of her previous femoral grafts on 5/26 by Dr. Rosalva Comber.  Underwent AKA and wound flap placement on 5/28 by Dr. Charlotte Cookey.   Transferred OOU on 5/29     Assessment and Plan: * Septic shock due to group A strep bacteremia and MRSA graft infection due to postsurgical femoropopliteal bypass graft infection (HCC) Vascular graft infection Blood cultures on admission with GAS.  Intraoperative cultures from 5/26 positive for MRSA.   Question of whether she received some doses of expired Daptomycin ; safety inquiry in process.  Repeat blood cultures 5/27 NGTD - Continue daptomycin  - EOT 7/9 - Transition to doxycycline, penicillin  for 2 months after then, then doxycyline suppression indefinitely    PAD (peripheral artery disease) (HCC) S/p Above knee amputation  Eliquis  stopped this admission.  This was indicated for critical limb ischemia, but status post amputation is no longer required.  There is some skin changes to the stump 6/1, concern for ischemic change, these remain stable, not progressing. - Continue new aspirin  - Continue Crestor , Zetia   Normocytic anemia Hgb 4.4 on admission, etiology probably unclear.  Transfused 3u PRBCs prior to surgery and stable since.  Hypertension Hyperlipidemia Bp remains high - Continue irbesartan , hydralazine  - Increase new amlodipine  - Continue new hydrochlorothiazide    Hyponatremia Mild,  asymptomatic, stable on new hydrochlorothiazide  - Continue hydrochlorothiazide ,-trend BMP       Subjective: She is feeling okay, there is no further skin changes, she is having mostly controlled pain, she is working well with therapy.     Physical Exam: BP (!) 169/56   Pulse 70   Temp 97.7 F (36.5 C) (Oral)   Resp (!) 23   Ht 5\' 6"  (1.676 m)   Wt 69.4 kg   SpO2 98%   BMI 24.69 kg/m   Thin adult female, sitting up in bed, interactive and appropriate, all history collected through video phonic interpreter RRR, no murmurs, no peripheral edema Respiratory rate seems slightly increased, but she does not appear out of breath, lung sounds are normal Abdomen soft, no tenderness palpation Attention normal, affect pleasant, judgment and insight appear normal, face symmetric, speech fluent, upper extremity strength normal Skin of the stump appears discolored, but no change at all from yesterday, subtle enlargement of the bulla in the center of the discoloration    Data Reviewed: Basic metabolic panel shows persistent hyponatremia, stable renal function CBC shows white count 15 improved from previous Anemia slightly worse  Family Communication: Granddaughter at the bedside    Disposition: Status is: Inpatient         Author: Ephriam Hashimoto, MD 06/05/2023 1:54 PM  For on call review [email or web address].

## 2023-06-05 NOTE — Consult Note (Addendum)
 Physical Medicine and Rehabilitation Consult Reason for Consult:right AKA Referring Physician: Danford   HPI: Renny Record is a 68 y.o. female with a history of hypertension as well as PAD with multiple bypass surgeries in the past status post right femoropopliteal bypass graft on 05/11/2023.  Patient was readmitted on 05/29/2023 with septic shock due to infected surgical area.  A graft excision and washout was performed on the same day.  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  Eliquis  was stopped after amputation as it was being used for her PAD.  Vacuum in place for right groin wound.  Patient last got up with physical therapy over the weekend and was min assist +2 for sit to stand transfers and pivoted using heel-toe method with progression of the small hops using rolling walker.  Occupational Therapy worked with the patient yesterday and she was min to mod assist for basic ADL tasks.  Patient was independent prior to arrival using a single-point cane and rolling walker.  She lives with children who are available for assistance at home.  She has a 1 level house with 5 steps to enter.    Home: Home Living Family/patient expects to be discharged to:: Private residence Living Arrangements: Children, Other relatives (daughter and grandson) Available Help at Discharge: Family, Available 24 hours/day Type of Home: Mobile home Home Access: Stairs to enter [employer] of Steps: 5 Entrance Stairs-Rails: Right, Left, Can reach both Home Layout: One level Bathroom Shower/Tub: Health visitor: Standard Bathroom Accessibility: Yes Home Equipment: Cane - single point, Agricultural consultant (2 wheels)  Lives With: Daughter, Family  Functional History: Prior Function Prior Level of Function :  Independent/Modified Independent Mobility Comments: Alternates between using SPC and RW. Reports no recent falls. ADLs Comments: Pt typically Independent with ADLs. Pt reports often taking a sponge bath due to the tub/shower bring small. Has family assist with driving Functional Status:  Mobility: Bed Mobility Overal bed mobility: Needs Assistance Bed Mobility: Supine to Sit Supine to sit: Min assist General bed mobility comments: ModA to shift hips towards EOB with bed pad. Pt able to move LE's towards EOB with slight assist for trunk elevation Transfers Overall transfer level: Needs assistance Equipment used: Rolling walker (2 wheels) Transfers: Sit to/from Stand, Bed to chair/wheelchair/BSC Sit to Stand: Mod assist Bed to/from chair/wheelchair/BSC transfer type:: Step pivot Step pivot transfers: Min assist, +2 safety/equipment General transfer comment: x2 and takes 4-5 hops forward with close chair follow      ADL: ADL Overall ADL's : Needs assistance/impaired Eating/Feeding: Set up, Sitting Grooming: Set up, Sitting Upper Body Bathing: Minimal assistance, Sitting Lower Body Bathing: Minimal assistance, Sitting/lateral leans, Moderate assistance Upper Body Dressing : Minimal assistance, Sitting Lower Body Dressing: Minimal assistance, Moderate assistance Toilet Transfer: Moderate assistance, Stand-pivot, BSC/3in1, Rolling walker (2 wheels) Toileting- Clothing Manipulation and Hygiene: Contact guard assist Functional mobility during ADLs: Moderate assistance, Rolling walker (2 wheels)  Cognition: Cognition Orientation Level: Oriented X4 Cognition Arousal: Alert Behavior During Therapy: WFL for tasks assessed/performed   Review of Systems  Constitutional:  Negative for fever.  HENT: Negative.    Eyes: Negative.   Respiratory: Negative.    Cardiovascular: Negative.   Gastrointestinal: Negative.   Genitourinary: Negative.   Musculoskeletal:  Positive for joint pain.   Skin:  Negative for rash.  Neurological:  Positive for sensory change.  Psychiatric/Behavioral:  Negative for substance abuse. The patient does not have insomnia.    Past Medical History:  Diagnosis Date   Cervical polyp 09/18/2018   Critical lower limb ischemia (HCC)    Encounter for screening mammogram for breast cancer 05/05/2016   Fibrocystic breast changes of both breasts 04/29/2015   Hyperlipidemia    Hypertension    PAD (peripheral artery disease) (HCC) 08/23/2019   Stroke (HCC) 08/23/2019   Past Surgical History:  Procedure Laterality Date   ABDOMINAL AORTOGRAM W/LOWER EXTREMITY Bilateral 08/23/2019   Procedure: ABDOMINAL AORTOGRAM W/ Bilateral LOWER EXTREMITY [place];  Surgeon: Richrd Char, MD;  Location: [facility];  Service: Cardiovascular;  Laterality: Bilateral;   AMPUTATION Right 05/31/2023   Procedure: AMPUTATION, ABOVE KNEE SARTORIOUS MUSCLE FLAP;  Surgeon: Margherita Shell, MD;  Location: [facility] OR;  Service: Vascular;  Laterality: Right;   APPLICATION OF WOUND VAC Right 05/31/2023   Procedure: APPLICATION, WOUND VAC;  Surgeon: Margherita Shell, MD;  Location: [facility] OR;  Service: Vascular;  Laterality: Right;   APPLICATION, SKIN SUBSTITUTE Right 05/12/2023   Procedure: APPLICATION, SKIN SUBSTITUTE KERECIS 38SQ CM;  Surgeon: Margherita Shell, MD;  Location: [facility] OR;  Service: Vascular;  Laterality: Right;   AVGG REMOVAL Right 05/29/2023   Procedure: REMOVAL BYPASS GRAFT RIGHT LEG;  Surgeon: Kayla Part, MD;  Location: [facility];  Service: Vascular;  Laterality: Right;   AXILLARY-FEMORAL BYPASS GRAFT Right 11/11/2019   Procedure: BYPASS GRAFT RIGHT AXILLA-BIFEMORAL;  Surgeon: Richrd Char, MD;  Location: [facility] OR;  Service: Vascular;  Laterality: Right;   BRAIN SURGERY     BREAST SURGERY Bilateral 2014   1980 R breast surgery   CEREBRAL ANEURYSM REPAIR     ENDARTERECTOMY FEMORAL Bilateral 11/11/2019   Procedure: ENDARTERECTOMY FEMORAL BILATERALLY;  Surgeon: Richrd Char, MD;  Location: [facility] OR;  Service: Vascular;  Laterality: Bilateral;   ENDARTERECTOMY FEMORAL Right 01/26/2022   Procedure: REDO RIGHT COMMON FEMORAL ENDARTERECTOMY USING 0.8CM X 7.6CM HEMASHIELD PLATINUM FINESSE PATCH;  Surgeon: Margherita Shell, MD;  Location: [facility] OR;  Service: Vascular;  Laterality: Right;   FEMORAL-POPLITEAL BYPASS GRAFT Right 01/26/2022   Procedure: RIGHT FEMORAL-POPLITEAL BYPASS GRAFT USING GORE X 80CM PROPATEN GRAFT;  Surgeon: Margherita Shell, MD;  Location: [facility];  Service: Vascular;  Laterality: Right;   FEMORAL-POPLITEAL BYPASS GRAFT Right 05/12/2023   Procedure: BYPASS GRAFT FEMORAL-POPLITEAL ARTERY;  Surgeon: Margherita Shell, MD;  Location: [facility];  Service: Vascular;  Laterality: Right;   INCISION AND DRAINAGE OF WOUND Right 05/31/2023   Procedure: IRRIGATION AND DEBRIDEMENT WOUND;  Surgeon: Margherita Shell, MD;  Location: [facility];  Service: Vascular;  Laterality: Right;  RIGHT GROIN WASHOUT   IR RADIOLOGIST EVAL & MGMT  04/02/2020   IR RADIOLOGIST EVAL & MGMT  03/29/2021   THROMBECTOMY FEMORAL ARTERY Right 01/26/2022   Procedure: THROMBECTOMY RIGHT FEMORAL ARTERY;  Surgeon: Margherita Shell, MD;  Location: [facility];  Service: Vascular;  Laterality: Right;   THROMBECTOMY OF BYPASS GRAFT FEMORAL- TIBIAL ARTERY Right 05/12/2023   Procedure: THROMBECTOMY, BYPASS GRAFT, ARTERIAL;  Surgeon: Margherita Shell, MD;  Location: [facility];  Service: Vascular;  Laterality: Right;   TUBAL LIGATION  1990   Family History  Problem Relation Age of Onset   Heart Problems Father    Heart Problems Brother    Heart Problems Brother    Social History:  reports that she quit smoking about 3 years ago. Her smoking use included  cigarettes. She has never been exposed to tobacco smoke. She has never used smokeless tobacco. She reports that she does not drink alcohol  and does not use drugs. Allergies: No Active Allergies Medications Prior to Admission  Medication Sig Dispense Refill    alendronate (FOSAMAX) 70 MG tablet Take 70 mg by mouth once a week.     apixaban  (ELIQUIS ) 5 MG TABS tablet Take 1 tablet (5 mg total) by mouth 2 (two) times daily. 180 tablet 0   ezetimibe  (ZETIA ) 10 MG tablet Take 10 mg by mouth daily.     hydrALAZINE  (APRESOLINE ) 25 MG tablet Take 25 mg by mouth 3 (three) times daily as needed (for BP >180/100).     rosuvastatin  (CRESTOR ) 40 MG tablet Take 40 mg by mouth daily.     TIADYLT  ER 180 MG 24 hr capsule Take 180 mg by mouth daily.     valsartan (DIOVAN) 160 MG tablet Take 160 mg by mouth daily.     acetaminophen  (TYLENOL ) 325 MG tablet Take 2 tablets (650 mg total) by mouth every 4 (four) hours as needed for mild pain (pain score 1-3). (Patient not taking: Reported on 05/28/2023)     oxyCODONE -acetaminophen  (PERCOCET/ROXICET) 5-325 MG tablet Take 1 tablet by mouth every 8 (eight) hours as needed for severe pain (pain score 7-10). (Patient not taking: Reported on 05/28/2023) 15 tablet 0   polyethylene glycol powder (GLYCOLAX /MIRALAX ) 17 GM/SCOOP powder Take 1 capful (17 g) by mouth daily as needed for mild constipation. 238 g 0     Blood pressure (!) 151/66, pulse 70, temperature (!) 97.5 F (36.4 C), temperature source Oral, resp. rate 18, height 5\' 6"  (1.676 m), weight 69.4 kg, SpO2 97%. Physical Exam Constitutional:      Appearance: She is not ill-appearing.  HENT:     Head: Normocephalic.     Right Ear: There is no impacted cerumen.     Left Ear: There is no impacted cerumen.     Nose: Nose normal.     Mouth/Throat:     Pharynx: Oropharynx is clear.  Eyes:     Pupils: Pupils are equal, round, and reactive to light.  Cardiovascular:     Rate and Rhythm: Normal rate.     Pulses: Normal pulses.  Pulmonary:     Effort: Pulmonary effort is normal.  Abdominal:     Palpations: Abdomen is soft.  Musculoskeletal:     Cervical back: Normal range of motion.     Comments: Early pes planus deformity left foot. Callus on fallen arch as well as 1st  MT head which are tender.  Skin:    Comments: Right AK incision well-approximated but with bruising/blistering along medial half of incision and more proximally on leg. Vac in right groin  Neurological:     Mental Status: She is alert.     Comments: Alert and oriented x 3. Normal insight and awareness. Intact Memory. Normal language and speech. Cranial nerve exam unremarkable. MMT: BUE 5/5. RLE 3/5 HF. LLE 4-HF and KE and 4/5 ADF/PF. Decreased LT in left foot. DTR's tr. No abnl resting tone.[name]    Psychiatric:        Mood and Affect: Mood normal.        Behavior: Behavior normal.     Results for orders placed or performed during the hospital encounter of 05/28/23 (from the past 24 hours)  Glucose, capillary     Status: None   Collection Time: 06/04/23 12:44 PM  Result Value Ref Range  Glucose-Capillary 94 70 - 99 mg/dL  Glucose, capillary     Status: Abnormal   Collection Time: 06/04/23  5:00 PM  Result Value Ref Range   Glucose-Capillary 102 (H) 70 - 99 mg/dL  Glucose, capillary     Status: None   Collection Time: 06/04/23  9:22 PM  Result Value Ref Range   Glucose-Capillary 96 70 - 99 mg/dL  CBC     Status: Abnormal   Collection Time: 06/05/23  5:00 AM  Result Value Ref Range   WBC 14.8 (H) 4.0 - 10.5 K/uL   RBC 3.17 (L) 3.87 - 5.11 MIL/uL   Hemoglobin 9.6 (L) 12.0 - 15.0 g/dL   HCT 16.1 (L) 09.6 - 04.5 %   MCV 90.2 80.0 - 100.0 fL   MCH 30.3 26.0 - 34.0 pg   MCHC 33.6 30.0 - 36.0 g/dL   RDW 40.9 81.1 - 91.4 %   Platelets 261 150 - 400 K/uL   nRBC 0.0 0.0 - 0.2 %  Glucose, capillary     Status: None   Collection Time: 06/05/23  5:42 AM  Result Value Ref Range   Glucose-Capillary 91 70 - 99 mg/dL  Renal function panel     Status: Abnormal   Collection Time: 06/05/23  6:00 AM  Result Value Ref Range   Sodium 130 (L) 135 - 145 mmol/L   Potassium 3.6 3.5 - 5.1 mmol/L   Chloride 98 98 - 111 mmol/L   CO2 24 22 - 32 mmol/L   Glucose, Bld 91 70 - 99 mg/dL   BUN <5 (L) 8 -  23 mg/dL   Creatinine, Ser 7.82 0.44 - 1.00 mg/dL   Calcium  8.6 (L) 8.9 - 10.3 mg/dL   Phosphorus 2.8 2.5 - 4.6 mg/dL   Albumin  1.9 (L) 3.5 - 5.0 g/dL   GFR, Estimated >95 >62 mL/min   Anion gap 8 5 - 15  Glucose, capillary     Status: Abnormal   Collection Time: 06/05/23 11:28 AM  Result Value Ref Range   Glucose-Capillary 111 (H) 70 - 99 mg/dL   No results found.  Assessment/Plan: Diagnosis: 68 year old female status post right above-knee amputation Does the need for close, 24 hr/day medical supervision in concert with the patient's rehab needs make it unreasonable for this patient to be served in a less intensive setting? Yes Co-Morbidities requiring supervision/potential complications:  -Septic shock/infectious disease considerations -Wound care considerations that both the stump and groin -Hypertension -Hyponatremia -Postop pain, phantom limb pain, pre-prosthetic education -orthotic considerations for left foot Due to bladder management, bowel management, safety, skin/wound care, disease management, medication administration, pain management, and patient education, does the patient require 24 hr/day rehab nursing? Yes Does the patient require coordinated care of a physician, rehab nurse, therapy disciplines of PT, OT to address physical and functional deficits in the context of the above medical diagnosis(es)? Yes Addressing deficits in the following areas: balance, endurance, locomotion, strength, transferring, bowel/bladder control, bathing, dressing, feeding, grooming, toileting, and psychosocial support Can the patient actively participate in an intensive therapy program of at least 3 hrs of therapy per day at least 5 days per week? Yes The potential for patient to make measurable gains while on inpatient rehab is excellent Anticipated functional outcomes upon discharge from inpatient rehab are supervision  with PT, supervision with OT at w/c level. Estimated rehab length of  stay to reach the above functional goals is: 8-11 days Anticipated discharge destination: Home Overall Rehab/Functional Prognosis: excellent  POST ACUTE RECOMMENDATIONS:  This patient's condition is appropriate for continued rehabilitative care in the following setting: CIR Patient has agreed to participate in recommended program. Yes Note that insurance prior authorization may be required for reimbursement for recommended care.  Comment: Pt is motivated. Has supportive family.   MEDICAL RECOMMENDATIONS: Lovenox 40mg  every day for dvt proph Might benefit from orthotic shoe to preserve skin/arch left foot. Titrate gabapentin  to TID if needed for phantom limb pain   I have personally performed a face to face diagnostic evaluation of this patient. Additionally, I have examined the patient's medical record including any pertinent labs and radiographic images.    Thanks,  Rawland Caddy, MD 06/05/2023

## 2023-06-05 NOTE — PMR Pre-admission (Shared)
 PMR Admission Coordinator Pre-Admission Assessment  Patient: Katherine Archer is an 68 y.o., female MRN: [id number] DOB: 05/01/1955 Height: 5\' 6"  (167.6 cm) Weight: 69.7 kg             Insurance Information HMO:     PPO: yes     PCP:      IPA:      80/20:      OTHER:  PRIMARY: A[employer]na Medicare      Policy#: [id number]      Subscriber: patient CM Name: Jyl Or      Phone#: [phone]     Fax#: [phone] Pre-Cert#: [id number]   approved 6/3 until 6/9   Employer:  Benefits:  Phone #: [phone]     Name:  Eff. Date: 01/04/23-still active     Deduct: does not have      Out of Pocket Max: $4,150 ($1,325 met)      Life Max: NA  CIR: $300/day co-pay with a max co-pay of $1,800/admission      SNF: $10/day co-pay for days 1-20, $214/day co-pay for days 21-100 Outpatient: $10 do-pay/visit     Co-Pay:  Home Health: 100% coverage      Co-Pay:  DME: 80% coverage     Co-Pay: 20% co-insurance Providers: in-network  SECONDARY:Medicaid of [place]   Policy#:   [id number] s    Phone#:   Artist:       Phone#:   The Engineer, materials Information Summary" for patients in Inpatient Rehabilitation Facilities with attached "Privacy Act Statement-Health Care Records" was provided and verbally reviewed with: Family  Emergency Contact Information Contact Information     Name Relation Home Work Mobile   Revera,Melody Granddaughter   [phone]   Lulani, Bour Sister   [phone]   Revera,Annie Daughter [phone]      Other Contacts     Name Relation Home Work Mobile   Henderson,Luz Daughter   [phone]      Current Medical History  Patient Admitting Diagnosis: right AKA  History of Present Illness: Pt is a 68 year old female with medical hx significant for: PAD on Eliquis  s/p redo right femoral artery exposure with thrombectomy, right popliteal artery exposure with bypass graft on 5/9, HTN, hyperlipidemia, stroke. Pt presented to [facility] on 05/28/23  with fevers and LE swelling. Pt reported intermittent nausea and vomiting with some diarrhea. Pt reported pain in right groin and leg. Also reported wounds have opened and started to have purulent drainage. Pt admitted with septic shock d/t infected right fem-pop bypass. Pt's Hgb was 4.4. Pt transfused 3 units PRBC. Vascular surgery consulted. Pt underwent graft excision and washout by Dr. Rosalva Comber on 5/26. Pt underwent right groin wash out and right AKA by Dr. Charlotte Cookey on 05/31/23. Therapy evaluations completed and CIR recommended d/t pt's deficits in functional mobility.    Glasgow Coma Scale Score: 15  Patient's medical record from [facility] has been reviewed by the rehabilitation admission coordinator and physician.  Past Medical History  Past Medical History:  Diagnosis Date   Cervical polyp 09/18/2018   Critical lower limb ischemia (HCC)    Encounter for screening mammogram for breast cancer 05/05/2016   Fibrocystic breast changes of both breasts 04/29/2015   Hyperlipidemia    Hypertension    PAD (peripheral artery disease) (HCC) 08/23/2019   Stroke (HCC) 08/23/2019   Has the patient had major surgery during 100 days prior to admission? Yes  Family History  family history includes Heart Problems in her brother,  brother, and father.  Current Medications   Current Facility-Administered Medications:    0.9 %  sodium chloride  infusion (Manually program via Guardrails IV Fluids), , Intravenous, Once, Rhyne, Samantha J, PA-C, Held at 05/28/23 2239   0.9 %  sodium chloride  infusion, 10 mL/hr, Intravenous, Once, Rhyne, Samantha J, PA-C, Last Rate: 10 mL/hr at 06/01/23 1409, 10 mL/hr at 06/01/23 1409   acetaminophen  (TYLENOL ) tablet 650 mg, 650 mg, Oral, Q6H PRN, Rhyne, Samantha J, PA-C, 650 mg at 05/30/23 1259   amLODipine  (NORVASC ) tablet 10 mg, 10 mg, Oral, Daily, Danford, Willis Harter, MD, 10 mg at 06/06/23 4098   aspirin  EC tablet 81 mg, 81 mg, Oral, Daily, Danford, Willis Harter,  MD, 81 mg at 06/06/23 1191   Chlorhexidine  Gluconate Cloth 2 % PADS 6 each, 6 each, Topical, Daily, Rhyne, Samantha J, PA-C, 6 each at 06/06/23 0920   docusate sodium  (COLACE) capsule 100 mg, 100 mg, Oral, BID PRN, Rhyne, Samantha J, PA-C, 100 mg at 06/06/23 4782   ezetimibe  (ZETIA ) tablet 10 mg, 10 mg, Oral, Daily, Harris, Whitney D, NP, 10 mg at 06/06/23 0918   feeding supplement (ENSURE PLUS HIGH PROTEIN) liquid 237 mL, 237 mL, Oral, BID BM, Parker, Alicia C, NP, 237 mL at 06/06/23 1341   gabapentin  (NEURONTIN ) capsule 300 mg, 300 mg, Oral, BID, Margherita Shell, MD, 300 mg at 06/06/23 0919   heparin  injection 5,000 Units, 5,000 Units, Subcutaneous, Q8H, Rhyne, Samantha J, PA-C, 5,000 Units at 06/06/23 1340   hydrALAZINE  (APRESOLINE ) injection 10-40 mg, 10-40 mg, Intravenous, Q4H PRN, Rhyne, Samantha J, PA-C, 20 mg at 06/02/23 2204   hydrALAZINE  (APRESOLINE ) tablet 25 mg, 25 mg, Oral, Q8H, Harris, Whitney D, NP, 25 mg at 06/06/23 1340   hydrochlorothiazide  (HYDRODIURIL ) tablet 12.5 mg, 12.5 mg, Oral, Daily, Danford, Willis Harter, MD, 12.5 mg at 06/06/23 9562   HYDROmorphone  (DILAUDID ) injection 1 mg, 1 mg, Intravenous, Q3H PRN, Rhyne, Samantha J, PA-C, 1 mg at 06/03/23 2147   insulin  aspart (novoLOG ) injection 0-9 Units, 0-9 Units, Subcutaneous, TID WC, Rhyne, Samantha J, PA-C, 1 Units at 06/06/23 1627   irbesartan  (AVAPRO ) tablet 75 mg, 75 mg, Oral, Daily, Rhyne, Samantha J, PA-C, 75 mg at 06/06/23 1308   ondansetron  (ZOFRAN ) injection 4 mg, 4 mg, Intravenous, Q6H PRN, Rhyne, Samantha J, PA-C, 4 mg at 06/01/23 0348   Oral care mouth rinse, 15 mL, Mouth Rinse, PRN, Josiah Nigh, MD   oxyCODONE  (Oxy IR/ROXICODONE ) immediate release tablet 5-10 mg, 5-10 mg, Oral, Q6H PRN, Rhyne, Samantha J, PA-C, 10 mg at 06/06/23 1446   polyethylene glycol (MIRALAX  / GLYCOLAX ) packet 17 g, 17 g, Oral, Daily PRN, Rhyne, Samantha J, PA-C, 17 g at 06/06/23 0917   [START ON 06/09/2023] rosuvastatin  (CRESTOR ) tablet  40 mg, 40 mg, Oral, Daily, Danford, Willis Harter, MD   sodium chloride  flush (NS) 0.9 % injection 10-40 mL, 10-40 mL, Intracatheter, Q12H, Rhyne, Samantha J, PA-C, 10 mL at 06/06/23 0920   sodium chloride  flush (NS) 0.9 % injection 10-40 mL, 10-40 mL, Intracatheter, PRN, Rhyne, Samantha J, PA-C   sodium chloride  flush (NS) 0.9 % injection 10-40 mL, 10-40 mL, Intracatheter, PRN, Josiah Nigh, MD   vancomycin  (VANCOREADY) IVPB 1750 mg/350 mL, 1,750 mg, Intravenous, Daily, Danford, Willis Harter, MD, Last Rate: 175 mL/hr at 06/06/23 1036, 1,750 mg at 06/06/23 1036  Patients Current Diet:  Diet Order             Diet regular Room service appropriate? Yes; Fluid consistency:  Thin  Diet effective now                  Precautions / Restrictions Precautions Precautions: Fall Precaution/Restrictions Comments: R AKA, wound vac Restrictions Weight Bearing Restrictions Per Provider Order: Yes RLE Weight Bearing Per Provider Order: Non weight bearing   Has the patient had 2 or more falls or a fall with injury in the past year?No  Prior Activity Level Community (5-7x/wk): gets out of the house daily  Prior Functional Level Prior Function Prior Level of Function : Independent/Modified Independent Mobility Comments: Alternates between using SPC and RW. Reports no recent falls. ADLs Comments: Pt typically Independent with ADLs. Pt reports often taking a sponge bath due to the tub/shower bring small. Has family assist with driving  Self Care: Did the patient need help bathing, dressing, using the toilet or eating?  Independent  Indoor Mobility: Did the patient need assistance with walking from room to room (with or without device)? Independent  Stairs: Did the patient need assistance with internal or external stairs (with or without device)? Independent  Functional Cognition: Did the patient need help planning regular tasks such as shopping or remembering to take medications?  Independent  Patient Information Are you of Hispanic, Latino/a,or Spanish origin?: E. Yes, another Hispanic, Latino, or Spanish origin What is your race?: N. Other Pacific Islander Do you need or want an interpreter to communicate with a doctor or health care staff?: 1. Yes  Patient's Response To:  Health Literacy and Transportation Is the patient able to respond to health literacy and transportation needs?: Yes Health Literacy - How often do you need to have someone help you when you read instructions, pamphlets, or other written material from your doctor or pharmacy?: Never In the past 12 months, has lack of transportation kept you from medical appointments or from getting medications?: Yes In the past 12 months, has lack of transportation kept you from meetings, work, or from getting things needed for daily living?: No  Home Assistive Devices / Equipment Home Equipment: Cane - single point, Agricultural consultant (2 wheels)  Prior Device Use: Indicate devices/aids used by the patient prior to current illness, exacerbation or injury? Walker  Current Functional Level Cognition  Orientation Level: Oriented X4    Extremity Assessment (includes Sensation/Coordination)  Upper Extremity Assessment: Overall WFL for tasks assessed  Lower Extremity Assessment: Defer to PT evaluation RLE Deficits / Details: R AKA, Good quad contraction and ability to SLR RLE Sensation: WNL LLE Deficits / Details: Grossly 4+/5 LLE Sensation: WNL    ADLs  Overall ADL's : Needs assistance/impaired Eating/Feeding: Set up, Sitting Grooming: Set up, Sitting Upper Body Bathing: Minimal assistance, Sitting Lower Body Bathing: Minimal assistance, Sitting/lateral leans, Moderate assistance Upper Body Dressing : Minimal assistance, Sitting Lower Body Dressing: Minimal assistance, Moderate assistance Toilet Transfer: Moderate assistance, Stand-pivot, BSC/3in1, Rolling walker (2 wheels) Toileting- Clothing Manipulation  and Hygiene: Contact guard assist Functional mobility during ADLs: Moderate assistance, Rolling walker (2 wheels)    Mobility  Overal bed mobility: Needs Assistance Bed Mobility: Supine to Sit Supine to sit: Modified independent (Device/Increase time), Used rails, HOB elevated General bed mobility comments: extra time, exits R side of bed    Transfers  Overall transfer level: Needs assistance Equipment used: Rolling walker (2 wheels) Transfers: Sit to/from Stand Sit to Stand: Contact guard assist, Min assist Bed to/from chair/wheelchair/BSC transfer type:: Step pivot Step pivot transfers: Contact guard assist General transfer comment: sit<>stand from EOB with CGA, min A to  stand from low commode    Ambulation / Gait / Stairs / Wheelchair Mobility  Ambulation/Gait Ambulation/Gait assistance: Contact guard assist Gait Distance (Feet): 18 Feet (x2) Assistive device: Rolling walker (2 wheels) Gait Pattern/deviations: Decreased step length - left, Decreased stride length, Step-to pattern General Gait Details: Pt ambulates in room with RW & CGA, good ability to use BUE to offload LLE so not hopping on L foot Gait velocity: decreased    Posture / Balance Balance Overall balance assessment: Needs assistance Sitting-balance support: No upper extremity supported, Feet supported Sitting balance-Leahy Scale: Good Standing balance support: Bilateral upper extremity supported, During functional activity, Reliant on assistive device for balance Standing balance-Leahy Scale: Fair    Special needs/care consideration Wound Vac groin/right, Skin Wound-Dehisced: thigh/anterior, right; Erythema/Redness: leg/right; Surgical incision: thigh/right and leg/right, Contact Precautions and Diabetic management Novolog  0-9 units 3x daily with meals To be on home long term antibiotics and Melody is prepared to learn how to give    Previous Home Environment  Living Arrangements: Children, Other relatives  (daughter and grandson)  Lives With: Daughter, Family Available Help at Discharge: Family, Available 24 hours/day Type of Home: Mobile home Home Layout: One level Home Access: Stairs to enter Entrance Stairs-Rails: Right, Left, Can reach both Entrance Stairs-Number of Steps: 5 Bathroom Shower/Tub: Health visitor: Pharmacist, community: Yes Home Care Services: No  Discharge Living Setting Plans for Discharge Living Setting: House (granddaughter Melody's house) Type of Home at Discharge: House Discharge Home Layout: One level Discharge Home Access: Stairs to enter (a ramp is being built) Science writer: Can reach both Secretary/administrator of Steps: 4 Discharge Bathroom Shower/Tub: Tub/shower unit Discharge Bathroom Toilet: Standard Discharge Bathroom Accessibility: Yes How Accessible: Accessible via walker Does the patient have any problems obtaining your medications?: No  Social/Family/Support Systems Anticipated Caregiver: Melody, granddaughter Anticipated Industrial/product designer Information: [phone] Caregiver Availability: 24/7 Discharge Plan Discussed with Primary Caregiver: Yes Is Caregiver In Agreement with Plan?: Yes Does Caregiver/Family have Issues with Lodging/Transportation while Pt is in Rehab?: No  Goals Patient/Family Goal for Rehab: Supervision: PT/OT Expected length of stay: 8-11 days Pt/Family Agrees to Admission and willing to participate: Yes Program Orientation Provided & Reviewed with Pt/Caregiver Including Roles  & Responsibilities: Yes  Decrease burden of Care through IP rehab admission: NA  Possible need for SNF placement upon discharge:Not anticipated  Patient Condition: This patient's condition remains as documented in the consult dated 06/05/23, in which the Rehabilitation Physician determined and documented that the patient's condition is appropriate for intensive rehabilitative care in an inpatient rehabilitation  facility. Will admit to inpatient rehab today.  Preadmission Screen Completed By:  Amiel Kalata, CCC-SLP, 06/06/2023 5:55 PM ______________________________________________________________________   Discussed status with Dr. Aaron Aason***at *** and received approval for admission today.  Admission Coordinator:  Amiel Kalata, time***/Date***

## 2023-06-05 NOTE — Progress Notes (Signed)
 Physical Therapy Treatment Patient Details Name: Katherine Archer MRN: [id number] DOB: 1955-02-23 Today's Date: 06/05/2023   History of Present Illness 68 y.o. female presents to [facility] 05/28/23 with worsening RLE pain/swelling and dizziness, s/p R fem-pop BGD on 5/8. Admitted w/ septic shock 2/2 infected R fem pop bypass and anemia. 5/26 R LE graft excision and washout. 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 ICU stay 5/26-5/30. PMHx: PAD, axillobifemoral bypass, multiple RLE procedures, stroke, HTN, HLD.    PT Comments  Pt seen for PT tx with pt agreeable to tx, granddaughter (Katherine Archer) present & interpreting throughout session.  Pt is motivated to participate, able to complete bed mobility with mod I, sit<>stand with CGA fade to supervision. Pt is able to progress gait in room ~20 ft with RW & CGA with good ability to offload LLE with BUE to advance LLE. Pt remains great candidate for CIR & has good family support. Will continue to follow pt acutely to progress mobility as able.   If plan is discharge home, recommend the following: Assistance with cooking/housework;Assist for transportation;Help with stairs or ramp for entrance;A little help with walking and/or transfers;A little help with bathing/dressing/bathroom   Can travel by private Theme park manager (measurements PT);Wheelchair cushion (measurements PT)    Recommendations for Other Services       Precautions / Restrictions Precautions Precautions: Fall Precaution/Restrictions Comments: R AKA, wound vac Restrictions Weight Bearing Restrictions Per Provider Order: Yes RLE Weight Bearing Per Provider Order: Non weight bearing     Mobility  Bed Mobility Overal bed mobility: Needs Assistance Bed Mobility: Supine to Sit     Supine to sit: Modified independent (Device/Increase time), Used rails, HOB elevated     General bed mobility comments: extra time, exits R side of bed    Transfers Overall transfer  level: Needs assistance Equipment used: Rolling walker (2 wheels) Transfers: Sit to/from Stand Sit to Stand: Supervision, Contact guard assist   Step pivot transfers: Contact guard assist       General transfer comment: sit<>stand from EOB, recliner, cuing re: hand placement to push to standing    Ambulation/Gait Ambulation/Gait assistance: Contact guard assist Gait Distance (Feet): 20 Feet Assistive device: Rolling walker (2 wheels) Gait Pattern/deviations: Decreased step length - left, Decreased stride length, Step-to pattern Gait velocity: decreased     General Gait Details: Pt ambulates in room with RW & CGA, good ability to use BUE to offload LLE so not hopping on L foot   Stairs             Wheelchair Mobility     Tilt Bed    Modified Rankin (Stroke Patients Only)       Balance Overall balance assessment: Needs assistance Sitting-balance support: No upper extremity supported, Feet supported Sitting balance-Leahy Scale: Good     Standing balance support: Bilateral upper extremity supported, During functional activity, Reliant on assistive device for balance Standing balance-Leahy Scale: Fair                              Communication    Cognition Arousal: Alert Behavior During Therapy: WFL for tasks assessed/performed, Impulsive   PT - Cognitive impairments: Safety/Judgement                       PT - Cognition Comments: Pt transferring to standing without PT beside her Following commands: Intact  Cueing Cueing Techniques: Verbal cues, Tactile cues  Exercises      General Comments General comments (skin integrity, edema, etc.): slight drainage noted from distal end of R residual limb, nurse made aware      Pertinent Vitals/Pain Pain Assessment Pain Assessment: Faces Faces Pain Scale: Hurts a little bit Pain Location: ulcers on L foot with weight bearing/gait training Pain Descriptors / Indicators: Discomfort,  Guarding Pain Intervention(s): Limited activity within patient's tolerance, Monitored during session    Home Living   Living Arrangements: Children;Other relatives (daughter and grandson) Available Help at Discharge: Family;Available 24 hours/day                    Prior Function            PT Goals (current goals can now be found in the care plan section) Acute Rehab PT Goals Patient Stated Goal: to be able to ambulate with crutches PT Goal Formulation: With patient/family Time For Goal Achievement: 06/17/23 Potential to Achieve Goals: Good Progress towards PT goals: Progressing toward goals    Frequency    Min 3X/week      PT Plan      Co-evaluation              AM-PAC PT "6 Clicks" Mobility   Outcome Measure  Help needed turning from your back to your side while in a flat bed without using bedrails?: None Help needed moving from lying on your back to sitting on the side of a flat bed without using bedrails?: A Little Help needed moving to and from a bed to a chair (including a wheelchair)?: A Little Help needed standing up from a chair using your arms (e.g., wheelchair or bedside chair)?: A Little Help needed to walk in hospital room?: A Little Help needed climbing 3-5 steps with a railing? : A Lot 6 Click Score: 18    End of Session   Activity Tolerance: Patient tolerated treatment well (did not want to overdue it & cause increasing L foot pain) Patient left: in chair;with call bell/phone within reach;with family/visitor present Nurse Communication: Mobility status PT Visit Diagnosis: Other abnormalities of gait and mobility (R26.89);Unsteadiness on feet (R26.81);Muscle weakness (generalized) (M62.81)     Time: (P) 1442-(P) 1458 PT Time Calculation (min) (ACUTE ONLY): (P) 16 min  Charges:    $Therapeutic Activity: (P) 8-22 mins PT General Charges $$ ACUTE PT VISIT: (P) 1 Visit                     Katherine Archer, PT, DPT 06/05/23, 3:08  PM   Katherine Archer 06/05/2023, 3:06 PM

## 2023-06-06 DIAGNOSIS — A419 Sepsis, unspecified organism: Secondary | ICD-10-CM | POA: Diagnosis not present

## 2023-06-06 DIAGNOSIS — R748 Abnormal levels of other serum enzymes: Secondary | ICD-10-CM | POA: Insufficient documentation

## 2023-06-06 DIAGNOSIS — R6521 Severe sepsis with septic shock: Secondary | ICD-10-CM | POA: Diagnosis not present

## 2023-06-06 HISTORY — DX: Abnormal levels of other serum enzymes: R74.8

## 2023-06-06 LAB — CBC
HCT: 26.9 % — ABNORMAL LOW (ref 36.0–46.0)
Hemoglobin: 8.8 g/dL — ABNORMAL LOW (ref 12.0–15.0)
MCH: 29.8 pg (ref 26.0–34.0)
MCHC: 32.7 g/dL (ref 30.0–36.0)
MCV: 91.2 fL (ref 80.0–100.0)
Platelets: 270 10*3/uL (ref 150–400)
RBC: 2.95 MIL/uL — ABNORMAL LOW (ref 3.87–5.11)
RDW: 15.4 % (ref 11.5–15.5)
WBC: 12.8 10*3/uL — ABNORMAL HIGH (ref 4.0–10.5)
nRBC: 0 % (ref 0.0–0.2)

## 2023-06-06 LAB — RENAL FUNCTION PANEL
Albumin: 1.8 g/dL — ABNORMAL LOW (ref 3.5–5.0)
Anion gap: 7 (ref 5–15)
BUN: 8 mg/dL (ref 8–23)
CO2: 26 mmol/L (ref 22–32)
Calcium: 8.6 mg/dL — ABNORMAL LOW (ref 8.9–10.3)
Chloride: 100 mmol/L (ref 98–111)
Creatinine, Ser: 0.62 mg/dL (ref 0.44–1.00)
GFR, Estimated: >60 mL/min (ref >60)
Glucose, Bld: 98 mg/dL (ref 70–99)
Phosphorus: 3.2 mg/dL (ref 2.5–4.6)
Potassium: 3.7 mmol/L (ref 3.5–5.1)
Sodium: 133 mmol/L — ABNORMAL LOW (ref 135–145)

## 2023-06-06 LAB — GLUCOSE, CAPILLARY
Glucose-Capillary: 96 mg/dL (ref 70–99)
Glucose-Capillary: 117 mg/dL — ABNORMAL HIGH (ref 70–99)
Glucose-Capillary: 139 mg/dL — ABNORMAL HIGH (ref 70–99)
Glucose-Capillary: 115 mg/dL — ABNORMAL HIGH (ref 70–99)

## 2023-06-06 LAB — CK: Total CK: 3217 U/L — ABNORMAL HIGH (ref 38–234)

## 2023-06-06 MED ORDER — ROSUVASTATIN CALCIUM 20 MG PO TABS: 40.0000 mg | ORAL_TABLET | Freq: Every day | ORAL | Status: DC

## 2023-06-06 MED ORDER — VANCOMYCIN HCL 1750 MG/350ML IV SOLN
1750.0000 mg | Freq: Every day | INTRAVENOUS | Status: DC
  Administered 2023-06-06: 1750 mg via INTRAVENOUS
  Filled 2023-06-06 (×2): qty 350

## 2023-06-06 MED ORDER — VANCOMYCIN HCL 1500 MG/300ML IV SOLN
1500.0000 mg | Freq: Once | INTRAVENOUS | Status: DC
  Filled 2023-06-06: qty 300

## 2023-06-06 NOTE — Plan of Care (Signed)

## 2023-06-06 NOTE — Progress Notes (Signed)
 Pharmacy Antibiotic Note  Katherine Archer is a 68 y.o. female admitted on 05/28/2023 with worsening RLE pain and swelling. Pt had a fem-pop bypass with redo and recently worsening swelling. Pharmacy has been consulted to transition from daptomycin  to vancomycin  dosing for increasing CK  2100>3200.  Plan: Vancomycin  1750mg  IV q24 hours (eAUC 527, sCr 0.7, Vd 0.72) Monitor renal function and order vancomycin  levels as appropriate   Height: 5\' 6"  (167.6 cm) Weight: 69.7 kg (153 lb 10.6 oz) IBW/kg (Calculated) : 59.3  Temp (24hrs), Avg:98.4 F (36.9 C), Min:97.7 F (36.5 C), Max:98.9 F (37.2 C)  Recent Labs  Lab 06/01/23 0805 06/02/23 0500 06/03/23 0415 06/04/23 0620 06/05/23 0500 06/05/23 0600 06/06/23 0530  WBC 36.7* 22.0* 22.3*  --  14.8*  --  12.8*  CREATININE  --  0.73 0.64 0.68  --  0.73 0.62    Estimated Creatinine Clearance: 64.8 mL/min (by C-G formula based on SCr of 0.62 mg/dL).    No Active Allergies  Antimicrobials this admission: Zosyn  5/26 >> 5/27 Vanc 5/26 >> 5/27,, 6/3>> PCN 5/27>>5/29 Zyvox  5/27>>5/29 Dapto 5/29> 6/3   Microbiology results: 5/25 Bcx: GPC in chains in 1/2 bottles and BCID with strep pyogenes (no resistance detected) 5/26 abscess - MRSA, strep pyo 5/27 blood x2 > ngtd  Thank you for allowing pharmacy to be a part of this patient's care.  Mohammed Andrew, PharmD Clinical Pharmacist 06/06/2023 10:15 AM Please check AMION for all [facility] numbers

## 2023-06-06 NOTE — Progress Notes (Signed)
 Physical Therapy Treatment Patient Details Name: Katherine Archer MRN: [id number] DOB: 03-31-1955 Today's Date: 06/06/2023   History of Present Illness 68 y.o. female presents to [facility] 05/28/23 with worsening RLE pain/swelling and dizziness, s/p R fem-pop BGD on 5/8. Admitted w/ septic shock 2/2 infected R fem pop bypass and anemia. 5/26 R LE graft excision and washout. 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 ICU stay 5/26-5/30. PMHx: PAD, axillobifemoral bypass, multiple RLE procedures, stroke, HTN, HLD.    PT Comments  Pt resting in bed on arrival, eager for OOB mobility and demonstrating continues progress towards acute goals. Pt able to come to sitting on R EOB without assist with increased time. Pt demonstrating transfer to stand from EOB with CGA, however requiring min A to boost to stand from low commode height. Pt ambulating for x2 bouts in room with RW support and grossly CGA for safety with pt demonstrating good UE control. Pt agreeable to time up in chair at end of session. Family not present and ipad interpreter not connecting to interpreter throughout session despite multiple attempts, utilized some english and spanish as well as gestures throughout session. Pt continues to benefit from skilled PT services to progress toward functional mobility goals.     If plan is discharge home, recommend the following: Assistance with cooking/housework;Assist for transportation;Help with stairs or ramp for entrance;A little help with walking and/or transfers;A little help with bathing/dressing/bathroom   Can travel by private Theme park manager (measurements PT);Wheelchair cushion (measurements PT)    Recommendations for Other Services       Precautions / Restrictions Precautions Precautions: Fall Recall of Precautions/Restrictions: Intact Precaution/Restrictions Comments: R AKA, wound vac Restrictions Weight Bearing Restrictions Per Provider Order: Yes RLE Weight Bearing  Per Provider Order: Non weight bearing     Mobility  Bed Mobility Overal bed mobility: Needs Assistance Bed Mobility: Supine to Sit     Supine to sit: Modified independent (Device/Increase time), Used rails, HOB elevated     General bed mobility comments: extra time, exits R side of bed    Transfers Overall transfer level: Needs assistance Equipment used: Rolling walker (2 wheels) Transfers: Sit to/from Stand Sit to Stand: Contact guard assist, Min assist           General transfer comment: sit<>stand from EOB with CGA, min A to stand from low commode    Ambulation/Gait Ambulation/Gait assistance: Contact guard assist Gait Distance (Feet): 18 Feet (x2) Assistive device: Rolling walker (2 wheels) Gait Pattern/deviations: Decreased step length - left, Decreased stride length, Step-to pattern Gait velocity: decreased     General Gait Details: Pt ambulates in room with RW & CGA, good ability to use BUE to offload LLE so not hopping on L foot   Stairs             Wheelchair Mobility     Tilt Bed    Modified Rankin (Stroke Patients Only)       Balance Overall balance assessment: Needs assistance Sitting-balance support: No upper extremity supported, Feet supported Sitting balance-Leahy Scale: Good     Standing balance support: Bilateral upper extremity supported, During functional activity, Reliant on assistive device for balance Standing balance-Leahy Scale: Fair                              Communication Communication Communication: No apparent difficulties;Other (comment) Factors Affecting Communication: Non - English speaking, interpreter not  available (ipad interpreter not connecting, no family present, utilizing gestures and limited english and spanish)  Cognition Arousal: Alert Behavior During Therapy: WFL for tasks assessed/performed, Impulsive   PT - Cognitive impairments: Safety/Judgement                          Following commands: Intact      Cueing Cueing Techniques: Verbal cues, Tactile cues  Exercises Amputee Exercises Hip Extension: AROM, Right, 5 reps, Standing    General Comments General comments (skin integrity, edema, etc.): slight drainage noted from distal end of R residual limb, nurse made aware      Pertinent Vitals/Pain Pain Assessment Pain Assessment: Faces Faces Pain Scale: Hurts a little bit Pain Location: residual limb Pain Descriptors / Indicators: Discomfort, Guarding Pain Intervention(s): Monitored during session, Limited activity within patient's tolerance    Home Living                          Prior Function            PT Goals (current goals can now be found in the care plan section) Acute Rehab PT Goals PT Goal Formulation: With patient/family Time For Goal Achievement: 06/17/23 Progress towards PT goals: Progressing toward goals    Frequency    Min 3X/week      PT Plan      Co-evaluation              AM-PAC PT "6 Clicks" Mobility   Outcome Measure  Help needed turning from your back to your side while in a flat bed without using bedrails?: None Help needed moving from lying on your back to sitting on the side of a flat bed without using bedrails?: A Little Help needed moving to and from a bed to a chair (including a wheelchair)?: A Little Help needed standing up from a chair using your arms (e.g., wheelchair or bedside chair)?: A Little Help needed to walk in hospital room?: A Little Help needed climbing 3-5 steps with a railing? : A Lot 6 Click Score: 18    End of Session Equipment Utilized During Treatment: Gait belt Activity Tolerance: Patient tolerated treatment well Patient left: in chair;with call bell/phone within reach Nurse Communication: Mobility status;Other (comment) (drainage from residual limb) PT Visit Diagnosis: Other abnormalities of gait and mobility (R26.89);Unsteadiness on feet (R26.81);Muscle  weakness (generalized) (M62.81) Pain - Right/Left: Right Pain - part of body: Leg     Time: 1610-9604 PT Time Calculation (min) (ACUTE ONLY): 18 min  Charges:    $Gait Training: 8-22 mins PT General Charges $$ ACUTE PT VISIT: 1 Visit                     Katherine Archer R. PTA Acute Rehabilitation Services Office: [phone]   Agapito Horseman 06/06/2023, 3:39 PM

## 2023-06-06 NOTE — Assessment & Plan Note (Signed)
-   Repeat CK Friday - Hold statin for now

## 2023-06-06 NOTE — Progress Notes (Incomplete)
 Pharmacy Antibiotic Note  Katherine Archer is a 68 y.o. female admitted on 05/28/2023 with {Indications:3041527}.  Pharmacy has been consulted for *** dosing.  Adjust Vanc to 1500 mg/24h   Plan: {Assessment:21075}  Height: 5\' 6"  (167.6 cm) Weight: 69.7 kg (153 lb 10.6 oz) IBW/kg (Calculated) : 59.3  Temp (24hrs), Avg:98.4 F (36.9 C), Min:97.7 F (36.5 C), Max:98.9 F (37.2 C)  Recent Labs  Lab 06/01/23 0805 06/02/23 0500 06/03/23 0415 06/04/23 0620 06/05/23 0500 06/05/23 0600 06/06/23 0530  WBC 36.7* 22.0* 22.3*  --  14.8*  --  12.8*  CREATININE  --  0.73 0.64 0.68  --  0.73 0.62    Estimated Creatinine Clearance: 64.8 mL/min (by C-G formula based on SCr of 0.62 mg/dL).    No Active Allergies  Antimicrobials this admission: *** *** >> *** *** *** >> ***  Dose adjustments this admission: ***  Microbiology results: *** BCx: *** *** UCx: ***  *** Sputum: ***  *** MRSA PCR: ***  Thank you for allowing pharmacy to be a part of this patient's care.  Vangie Genet 06/06/2023 9:53 AM

## 2023-06-06 NOTE — Progress Notes (Signed)
 PHARMACY CONSULT NOTE FOR:  OUTPATIENT  PARENTERAL ANTIBIOTIC THERAPY (OPAT)  Indication: Bacteremia/Right groin graft infection  Regimen: Vancomycin  1500 mg IV every 24 hours End date: 07/12/23   IV antibiotic discharge orders are pended. To discharging provider:  please sign these orders via discharge navigator,  Select New Orders & click on the button choice - Manage This Unsigned Work.     Thank you for allowing pharmacy to be a part of this patient's care.  Garland Junk, PharmD, BCPS, BCIDP Infectious Diseases Clinical Pharmacist 06/07/2023 1:23 PM   **Pharmacist phone directory can now be found on [email or web address] ([email or web address]).  Listed under [facility].

## 2023-06-06 NOTE — Progress Notes (Signed)
 Inpatient Rehabilitation Admissions Coordinator   I have received insurance approval for CIR , but no bed available today. Hopeful for a bed in the next 48 hrs. I contacted her granddaughter, Melody, by phone. She is aware. I also reviewed with Melody estimated cost of care for CIR.   Jeannetta Millman, RN, MSN Rehab Admissions Coordinator [phone] 06/06/2023 4:25 PM

## 2023-06-06 NOTE — Progress Notes (Signed)
 Progress Note   Patient: Katherine Archer ZOX:[id number] DOB: 1955-01-12 DOA: 05/28/2023     8 DOS: the patient was seen and examined on 06/06/2023 at 9:16AM      Brief hospital course: 68 y.o. F with HTN, HLD, PVD with hx axillo-fem bypass in 2021, fem-pop bypass Jan 2024 and recent admission earlier this month for critical limb ischemia requiring repeat fem-pop bypass with PTFE who presented with severe pain and swelling of operative leg, purulent drainage, and Hgb 4.4 g/dL.  Admitted to ICU with septic shock on pressors, blood transfusion.     Underwent excision of her previous femoral grafts on 5/26 by Dr. Rosalva Comber.  Underwent AKA and wound flap placement on 5/28 by Dr. Charlotte Cookey.   Transferred OOU on 5/29     Assessment and Plan: * Septic shock due to group A strep bacteremia and MRSA graft infection due to postsurgical femoropopliteal bypass graft infection (HCC) Vascular graft infection Blood cultures on admission with GAS.  Intraoperative cultures from 5/26 positive for MRSA.   She has an old synthetic ax-fem graft still in place in her body, this is ligated, but ID recommend lifelong suppression.  Repeat blood cultures 5/27 no growth and transitioned to Daptomycin .  Subsequently noted to have rising CK so transitioned back to Vanc.  - Transition to vancomycin  - Repeat CK on Friday - Consult ID, appreciate cares  - Plan for EOT 7/9 - At that time, ID recommend transition to doxycycline+penicillin  for 2 months after that doxycyline suppression indefinitely    PAD (peripheral artery disease) (HCC) S/p Above knee amputation  Eliquis  stopped this admission.  This was indicated for critical limb ischemia, but status post amputation is no longer required.  There is some skin changes to the stump 6/1, concern for ischemic change, these remain stable, not progressing.  Vascular surgery will follow at CIR  - Continue new aspirin  - Continue Zetia  - Hold Crestor  given rising CK - Repeat  CK on Friday and likely resume Crestor    Elevated CK I see chart history of "statin myopathy" in problem list but no description of it in notes and she has been on Crestor  since 2020 - Repeat CK Friday - Hold statin for now  Normocytic anemia Hgb 4.4 on admission, etiology probably unclear.  Transfused 3u PRBCs prior to surgery Hemoglobin trending slightly down, no clinical bleeding observed.  Hypertension Hyperlipidemia BP improving - Continue new amlodipine , hydrochlorothiazide   - Continue hydralazine , irbesartan           Subjective: Patient is feeling in good spirits.  She is not reading him know.  She has had no change in her pain, which is relatively well-controlled with oral pain medications.  She is eager to work with therapy.  The spots on her stump are beginning to blister a little bit, but no further skin changes, no spreading.     Physical Exam: BP (!) 142/59   Pulse 67   Temp (!) 97.1 F (36.2 C) (Oral)   Resp 20   Ht 5\' 6"  (1.676 m)   Wt 69.7 kg   SpO2 99%   BMI 24.80 kg/m   Thin adult female, sitting up in bed, interactive and appropriate RRR, no murmurs, no peripheral edema Respiratory rate normal, lungs clear without rales or wheezes Abdomen soft no tenderness palpation or guarding, no ascites or distention Attention normal, affect normal, judgment and insight appear normal No history collected through video phonic interpreter   Data Reviewed: Discussed with vascular surgery team regarding remaining  axillofemoral graft Basic metabolic panel shows mild hyponatremia, asymptomatic, stable renal function and potassium Low albumin  CBC shows white count down to 12, hemoglobin trending down to 8.8    Family Communication: Granddaughter at the bedside    Disposition: Status is: Inpatient The patient was admitted with infected vascular graft.  She is now status post right AKA, partial graft excision, but she has some remaining graft material in  place.  Cultures grew both GIS and MRSA, and ID recommended 6 weeks of IV antibiotics with vancomycin  followed by indefinite antibiotic suppression  From a surgical standpoint she is cleared for discharge to rehab, hopefully she will be able to transition to CIR tomorrow or Thursday        Author: Ephriam Hashimoto, MD 06/06/2023 2:56 PM  For on call review [email or web address].

## 2023-06-06 NOTE — Progress Notes (Addendum)
 Progress Note    06/06/2023 6:50 AM 6 Days Post-Op  Subjective:  denies pain in her stump  afebrile  Vitals:   06/06/23 0322 06/06/23 0533  BP: (!) 133/48 (!) 135/48  Pulse: 64 66  Resp: 20 (!) 22  Temp: 98.7 F (37.1 C)   SpO2: 96% 95%    Physical Exam: General:  no distress Lungs:  non labored Incisions:  right groin with peel and place vac Extremities:  right AKA stump with blistering medially        CBC    Component Value Date/Time   WBC 12.8 (H) 06/06/2023 0530   RBC 2.95 (L) 06/06/2023 0530   HGB 8.8 (L) 06/06/2023 0530   HGB 14.2 03/23/2021 0928   HCT 26.9 (L) 06/06/2023 0530   HCT 42.1 03/23/2021 0928   PLT 270 06/06/2023 0530   PLT 135 (L) 03/23/2021 0928   MCV 91.2 06/06/2023 0530   MCV 93 03/23/2021 0928   MCH 29.8 06/06/2023 0530   MCHC 32.7 06/06/2023 0530   RDW 15.4 06/06/2023 0530   RDW 12.0 03/23/2021 0928   LYMPHSABS 1.5 05/28/2023 2120   LYMPHSABS 1.9 03/23/2021 0928   MONOABS 0.9 05/28/2023 2120   EOSABS 0.0 05/28/2023 2120   EOSABS 0.3 03/23/2021 0928   BASOSABS 0.1 05/28/2023 2120   BASOSABS 0.0 03/23/2021 0928    BMET    Component Value Date/Time   NA 133 (L) 06/06/2023 0530   NA 138 01/15/2020 1128   K 3.7 06/06/2023 0530   CL 100 06/06/2023 0530   CO2 26 06/06/2023 0530   GLUCOSE 98 06/06/2023 0530   BUN 8 06/06/2023 0530   BUN 8 01/15/2020 1128   CREATININE 0.62 06/06/2023 0530   CALCIUM  8.6 (L) 06/06/2023 0530   GFRNONAA >60 06/06/2023 0530   GFRAA 80 01/15/2020 1128    INR    Component Value Date/Time   INR 1.5 (H) 05/29/2023 0030     Intake/Output Summary (Last 24 hours) at 06/06/2023 0650 Last data filed at 06/06/2023 0532 Gross per 24 hour  Intake 240 ml  Output 75 ml  Net 165 ml      Assessment/Plan:  68 y.o. female is s/p:  Ligation of the right axillobifemoral bypass graft limb Excision of the right common femoral artery Excision of right femoral to above-knee popliteal artery bypass with  above-knee popliteal artery ligation Right sided greater saphenous vein saphenectomy Excision of the right femoral to below-knee popliteal artery bypass with greater saphenous vein patch Femoral to above-knee popliteal bypass tunnel with 15 French drain Femoral to below-knee popliteal bypass tunnel with 19 French drain Groin with white and blue VAC sponge  05/29/2023 by Dr. Rosalva Comber   And    I&D right groin with Sartorious muscle flap, placement of Kerecis and abx beads and vac placement and right AKA on 05/31/2023 by Dr. Charlotte Cookey   -peel and place vac right groin for one week -right AKA stump with blistering on medial aspect-appears similar to yesterday's exam. -PT recommending CIR and pt appropriate per Dr. Rachel Budds and insurance authorization has started.  -pt currently on Daptomycin  with end date of 7/9. Plan to transition to doxycycline, PCN for 2 months and the doxy for suppression indefinitely.  -DVT prophylaxis:  sq heparin    Maryanna Smart, PA-C [facility] [phone] 06/06/2023 6:50 AM  I have seen and evaluated the patient. I agree with the PA note as documented above.  Vac changed to the right groin yesterday with good seal  today.  Right AKA looks the same since my initial evaluation on Saturday.  Will continue to monitor.  On daptomycin .  Okay for CIR and we can follow peripherally.  Young Hensen, MD [facility] [place] Office: [phone]

## 2023-06-07 ENCOUNTER — Other Ambulatory Visit: Payer: Self-pay

## 2023-06-07 ENCOUNTER — Other Ambulatory Visit (HOSPITAL_COMMUNITY): Payer: Self-pay

## 2023-06-07 ENCOUNTER — Inpatient Hospital Stay (HOSPITAL_COMMUNITY)
Admission: AD | Admit: 2023-06-07 | Discharge: 2023-06-16 | DRG: 560 | Disposition: A | Discharge status: Other Acute Inpatient Hospital | Source: Intra-hospital | Attending: Physical Medicine & Rehabilitation | Admitting: Physical Medicine & Rehabilitation

## 2023-06-07 ENCOUNTER — Encounter (HOSPITAL_COMMUNITY): Payer: Self-pay | Admitting: Physical Medicine & Rehabilitation

## 2023-06-07 DIAGNOSIS — S78111A Complete traumatic amputation at level between right hip and knee, initial encounter: Secondary | ICD-10-CM

## 2023-06-07 DIAGNOSIS — Y832 Surgical operation with anastomosis, bypass or graft as the cause of abnormal reaction of the patient, or of later complication, without mention of misadventure at the time of the procedure: Secondary | ICD-10-CM | POA: Diagnosis not present

## 2023-06-07 DIAGNOSIS — D62 Acute posthemorrhagic anemia: Secondary | ICD-10-CM | POA: Diagnosis not present

## 2023-06-07 DIAGNOSIS — Z794 Long term (current) use of insulin: Secondary | ICD-10-CM | POA: Diagnosis not present

## 2023-06-07 DIAGNOSIS — T8149XD Infection following a procedure, other surgical site, subsequent encounter: Secondary | ICD-10-CM | POA: Diagnosis not present

## 2023-06-07 DIAGNOSIS — Y835 Amputation of limb(s) as the cause of abnormal reaction of the patient, or of later complication, without mention of misadventure at the time of the procedure: Secondary | ICD-10-CM | POA: Diagnosis present

## 2023-06-07 DIAGNOSIS — K5900 Constipation, unspecified: Secondary | ICD-10-CM | POA: Diagnosis not present

## 2023-06-07 DIAGNOSIS — E871 Hypo-osmolality and hyponatremia: Secondary | ICD-10-CM | POA: Diagnosis present

## 2023-06-07 DIAGNOSIS — E1159 Type 2 diabetes mellitus with other circulatory complications: Secondary | ICD-10-CM | POA: Diagnosis not present

## 2023-06-07 DIAGNOSIS — Z7983 Long term (current) use of bisphosphonates: Secondary | ICD-10-CM

## 2023-06-07 DIAGNOSIS — Z89611 Acquired absence of right leg above knee: Secondary | ICD-10-CM

## 2023-06-07 DIAGNOSIS — T8789 Other complications of amputation stump: Secondary | ICD-10-CM | POA: Diagnosis present

## 2023-06-07 DIAGNOSIS — Z7901 Long term (current) use of anticoagulants: Secondary | ICD-10-CM

## 2023-06-07 DIAGNOSIS — Z8673 Personal history of transient ischemic attack (TIA), and cerebral infarction without residual deficits: Secondary | ICD-10-CM | POA: Diagnosis not present

## 2023-06-07 DIAGNOSIS — Z603 Acculturation difficulty: Secondary | ICD-10-CM | POA: Diagnosis not present

## 2023-06-07 DIAGNOSIS — S31109D Unspecified open wound of abdominal wall, unspecified quadrant without penetration into peritoneal cavity, subsequent encounter: Secondary | ICD-10-CM | POA: Diagnosis not present

## 2023-06-07 DIAGNOSIS — Z87891 Personal history of nicotine dependence: Secondary | ICD-10-CM | POA: Diagnosis not present

## 2023-06-07 DIAGNOSIS — I10 Essential (primary) hypertension: Secondary | ICD-10-CM | POA: Diagnosis not present

## 2023-06-07 DIAGNOSIS — I2510 Atherosclerotic heart disease of native coronary artery without angina pectoris: Secondary | ICD-10-CM | POA: Diagnosis not present

## 2023-06-07 DIAGNOSIS — Z4781 Encounter for orthopedic aftercare following surgical amputation: Secondary | ICD-10-CM | POA: Diagnosis not present

## 2023-06-07 DIAGNOSIS — I739 Peripheral vascular disease, unspecified: Secondary | ICD-10-CM | POA: Diagnosis present

## 2023-06-07 DIAGNOSIS — E43 Unspecified severe protein-calorie malnutrition: Secondary | ICD-10-CM | POA: Diagnosis present

## 2023-06-07 DIAGNOSIS — T827XXD Infection and inflammatory reaction due to other cardiac and vascular devices, implants and grafts, subsequent encounter: Secondary | ICD-10-CM

## 2023-06-07 DIAGNOSIS — E785 Hyperlipidemia, unspecified: Secondary | ICD-10-CM | POA: Diagnosis present

## 2023-06-07 DIAGNOSIS — G546 Phantom limb syndrome with pain: Secondary | ICD-10-CM | POA: Diagnosis present

## 2023-06-07 DIAGNOSIS — Z79899 Other long term (current) drug therapy: Secondary | ICD-10-CM | POA: Diagnosis not present

## 2023-06-07 DIAGNOSIS — Z7982 Long term (current) use of aspirin: Secondary | ICD-10-CM | POA: Diagnosis not present

## 2023-06-07 DIAGNOSIS — R739 Hyperglycemia, unspecified: Secondary | ICD-10-CM | POA: Diagnosis not present

## 2023-06-07 DIAGNOSIS — T8149XA Infection following a procedure, other surgical site, initial encounter: Secondary | ICD-10-CM | POA: Diagnosis not present

## 2023-06-07 DIAGNOSIS — A419 Sepsis, unspecified organism: Secondary | ICD-10-CM

## 2023-06-07 DIAGNOSIS — M79604 Pain in right leg: Secondary | ICD-10-CM | POA: Diagnosis not present

## 2023-06-07 DIAGNOSIS — I639 Cerebral infarction, unspecified: Secondary | ICD-10-CM | POA: Diagnosis not present

## 2023-06-07 HISTORY — DX: Acquired absence of right leg above knee: Z89.611

## 2023-06-07 LAB — CBC
HCT: 27.8 % — ABNORMAL LOW (ref 36.0–46.0)
HCT: 27.1 % — ABNORMAL LOW (ref 36.0–46.0)
Hemoglobin: 8.9 g/dL — ABNORMAL LOW (ref 12.0–15.0)
Hemoglobin: 8.5 g/dL — ABNORMAL LOW (ref 12.0–15.0)
MCH: 29.8 pg (ref 26.0–34.0)
MCH: 29.5 pg (ref 26.0–34.0)
MCHC: 32 g/dL (ref 30.0–36.0)
MCHC: 31.4 g/dL (ref 30.0–36.0)
MCV: 93 fL (ref 80.0–100.0)
MCV: 94.1 fL (ref 80.0–100.0)
Platelets: 303 10*3/uL (ref 150–400)
Platelets: 301 10*3/uL (ref 150–400)
RBC: 2.99 MIL/uL — ABNORMAL LOW (ref 3.87–5.11)
RBC: 2.88 MIL/uL — ABNORMAL LOW (ref 3.87–5.11)
RDW: 15.5 % (ref 11.5–15.5)
RDW: 15.4 % (ref 11.5–15.5)
WBC: 15.6 10*3/uL — ABNORMAL HIGH (ref 4.0–10.5)
WBC: 15.8 10*3/uL — ABNORMAL HIGH (ref 4.0–10.5)
nRBC: 0 % (ref 0.0–0.2)
nRBC: 0 % (ref 0.0–0.2)

## 2023-06-07 LAB — RENAL FUNCTION PANEL
Albumin: 1.9 g/dL — ABNORMAL LOW (ref 3.5–5.0)
Anion gap: 7 (ref 5–15)
BUN: 11 mg/dL (ref 8–23)
CO2: 26 mmol/L (ref 22–32)
Calcium: 8.7 mg/dL — ABNORMAL LOW (ref 8.9–10.3)
Chloride: 99 mmol/L (ref 98–111)
Creatinine, Ser: 0.53 mg/dL (ref 0.44–1.00)
GFR, Estimated: >60 mL/min (ref >60)
Glucose, Bld: 98 mg/dL (ref 70–99)
Phosphorus: 3.6 mg/dL (ref 2.5–4.6)
Potassium: 4 mmol/L (ref 3.5–5.1)
Sodium: 132 mmol/L — ABNORMAL LOW (ref 135–145)

## 2023-06-07 LAB — CREATININE, SERUM
Creatinine, Ser: 0.72 mg/dL (ref 0.44–1.00)
GFR, Estimated: >60 mL/min (ref >60)

## 2023-06-07 LAB — GLUCOSE, CAPILLARY
Glucose-Capillary: 101 mg/dL — ABNORMAL HIGH (ref 70–99)
Glucose-Capillary: 113 mg/dL — ABNORMAL HIGH (ref 70–99)
Glucose-Capillary: 116 mg/dL — ABNORMAL HIGH (ref 70–99)

## 2023-06-07 MED ORDER — HYDRALAZINE HCL 25 MG PO TABS
25.0000 mg | ORAL_TABLET | Freq: Three times a day (TID) | ORAL | Status: DC
  Administered 2023-06-07 – 2023-06-15 (×24): 25 mg via ORAL
  Filled 2023-06-07 (×24): qty 1

## 2023-06-07 MED ORDER — EZETIMIBE 10 MG PO TABS
10.0000 mg | ORAL_TABLET | Freq: Every day | ORAL | Status: DC
  Administered 2023-06-08 – 2023-06-16 (×9): 10 mg via ORAL
  Filled 2023-06-07 (×9): qty 1

## 2023-06-07 MED ORDER — ASPIRIN 81 MG PO TBEC
81.0000 mg | DELAYED_RELEASE_TABLET | Freq: Every day | ORAL | 12 refills | Status: DC
  Filled 2023-06-07: qty 30, 30d supply, fill #0

## 2023-06-07 MED ORDER — ACETAMINOPHEN 325 MG PO TABS
650.0000 mg | ORAL_TABLET | Freq: Four times a day (QID) | ORAL | Status: DC | PRN
  Administered 2023-06-07 – 2023-06-14 (×13): 650 mg via ORAL
  Filled 2023-06-07 (×13): qty 2

## 2023-06-07 MED ORDER — CHLORHEXIDINE GLUCONATE CLOTH 2 % EX PADS
6.0000 | MEDICATED_PAD | Freq: Two times a day (BID) | CUTANEOUS | Status: DC
  Administered 2023-06-08 – 2023-06-16 (×15): 6 via TOPICAL

## 2023-06-07 MED ORDER — AMLODIPINE BESYLATE 10 MG PO TABS
10.0000 mg | ORAL_TABLET | Freq: Every day | ORAL | Status: DC
  Administered 2023-06-08 – 2023-06-16 (×9): 10 mg via ORAL
  Filled 2023-06-07 (×9): qty 1

## 2023-06-07 MED ORDER — VANCOMYCIN IV (FOR PTA / DISCHARGE USE ONLY): 1500.0000 mg | INTRAVENOUS | Status: DC

## 2023-06-07 MED ORDER — ASPIRIN 81 MG PO TBEC
81.0000 mg | DELAYED_RELEASE_TABLET | Freq: Every day | ORAL | Status: DC
  Administered 2023-06-08 – 2023-06-15 (×8): 81 mg via ORAL
  Filled 2023-06-07 (×8): qty 1

## 2023-06-07 MED ORDER — AMLODIPINE BESYLATE 10 MG PO TABS
10.0000 mg | ORAL_TABLET | Freq: Every day | ORAL | 1 refills | Status: DC
Start: 2023-06-08 — End: 2023-06-14
  Filled 2023-06-07: qty 30, 30d supply, fill #0

## 2023-06-07 MED ORDER — VANCOMYCIN HCL 1500 MG/300ML IV SOLN
1500.0000 mg | Freq: Every day | INTRAVENOUS | Status: DC
  Administered 2023-06-07: 1500 mg via INTRAVENOUS
  Filled 2023-06-07: qty 300

## 2023-06-07 MED ORDER — HEPARIN SODIUM (PORCINE) 5000 UNIT/ML IJ SOLN: 5000.0000 [IU] | Freq: Three times a day (TID) | INTRAMUSCULAR | Status: DC

## 2023-06-07 MED ORDER — HEPARIN SODIUM (PORCINE) 5000 UNIT/ML IJ SOLN
5000.0000 [IU] | Freq: Three times a day (TID) | INTRAMUSCULAR | Status: DC
  Administered 2023-06-07 – 2023-06-16 (×26): 5000 [IU] via SUBCUTANEOUS
  Filled 2023-06-07 (×26): qty 1

## 2023-06-07 MED ORDER — VANCOMYCIN HCL 1500 MG/300ML IV SOLN
1500.0000 mg | Freq: Every day | INTRAVENOUS | Status: DC
  Administered 2023-06-08 – 2023-06-11 (×4): 1500 mg via INTRAVENOUS
  Filled 2023-06-07 (×5): qty 300

## 2023-06-07 MED ORDER — GABAPENTIN 300 MG PO CAPS
300.0000 mg | ORAL_CAPSULE | Freq: Two times a day (BID) | ORAL | Status: DC
  Administered 2023-06-07 – 2023-06-16 (×18): 300 mg via ORAL
  Filled 2023-06-07 (×18): qty 1

## 2023-06-07 MED ORDER — DOCUSATE SODIUM 100 MG PO CAPS: 100.0000 mg | ORAL_CAPSULE | Freq: Two times a day (BID) | ORAL | Status: DC | PRN

## 2023-06-07 MED ORDER — ENSURE PLUS HIGH PROTEIN PO LIQD
237.0000 mL | Freq: Two times a day (BID) | ORAL | Status: DC
  Administered 2023-06-08 – 2023-06-15 (×13): 237 mL via ORAL

## 2023-06-07 MED ORDER — IRBESARTAN 75 MG PO TABS
75.0000 mg | ORAL_TABLET | Freq: Every day | ORAL | Status: DC
  Administered 2023-06-08 – 2023-06-16 (×9): 75 mg via ORAL
  Filled 2023-06-07 (×9): qty 1

## 2023-06-07 MED ORDER — ROSUVASTATIN CALCIUM 20 MG PO TABS: 40.0000 mg | ORAL_TABLET | Freq: Every day | ORAL | Status: DC

## 2023-06-07 MED ORDER — SODIUM CHLORIDE 0.9% FLUSH
10.0000 mL | INTRAVENOUS | Status: DC | PRN
  Administered 2023-06-12: 10 mL

## 2023-06-07 MED ORDER — HYDROCHLOROTHIAZIDE 12.5 MG PO TABS
12.5000 mg | ORAL_TABLET | Freq: Every day | ORAL | Status: DC
  Administered 2023-06-08 – 2023-06-16 (×9): 12.5 mg via ORAL
  Filled 2023-06-07 (×9): qty 1

## 2023-06-07 MED ORDER — GABAPENTIN 300 MG PO CAPS
300.0000 mg | ORAL_CAPSULE | Freq: Two times a day (BID) | ORAL | 0 refills | Status: DC
Start: 2023-06-07 — End: 2023-06-14
  Filled 2023-06-07: qty 60, 30d supply, fill #0

## 2023-06-07 MED ORDER — HYDROCHLOROTHIAZIDE 12.5 MG PO TABS
12.5000 mg | ORAL_TABLET | Freq: Every day | ORAL | 0 refills | Status: DC
Start: 2023-06-08 — End: 2023-06-14
  Filled 2023-06-07: qty 30, 30d supply, fill #0

## 2023-06-07 MED ORDER — OXYCODONE HCL 5 MG PO TABS
5.0000 mg | ORAL_TABLET | Freq: Four times a day (QID) | ORAL | Status: DC | PRN
  Administered 2023-06-07 – 2023-06-11 (×13): 10 mg via ORAL
  Administered 2023-06-12: 5 mg via ORAL
  Administered 2023-06-12 – 2023-06-13 (×4): 10 mg via ORAL
  Filled 2023-06-07 (×14): qty 2
  Filled 2023-06-07: qty 1
  Filled 2023-06-07 (×3): qty 2

## 2023-06-07 MED ORDER — SODIUM CHLORIDE 0.9% FLUSH
10.0000 mL | Freq: Two times a day (BID) | INTRAVENOUS | Status: DC
  Administered 2023-06-08 – 2023-06-16 (×10): 10 mL

## 2023-06-07 MED ORDER — POLYETHYLENE GLYCOL 3350 17 G PO PACK: 17.0000 g | PACK | Freq: Every day | ORAL | Status: DC | PRN

## 2023-06-07 MED ORDER — DAPTOMYCIN IV (FOR PTA / DISCHARGE USE ONLY): 500.0000 mg | INTRAVENOUS | 0 refills | Status: DC

## 2023-06-07 NOTE — H&P (Incomplete)
 Physical Medicine and Rehabilitation Admission H&P    Chief Complaint  Patient presents with   Wound Infection   Code Sepsis  : HPI: Katherine Archer is a 68 year old right-handed non-English-speaking female with history of hypertension, CVA, hyperlipidemia, quit smoking 3 years ago, PAD maintained on Eliquis  with multiple revascularizations including s/p right femoral-popliteal BGD 05/12/2023 per Dr. Charlotte Cookey and discharged home 05/15/2023.  Per chart review patient lives with daughter and grandson.  1 level mobile home home 5 steps to entry.  Alternates between using a straight point cane and rolling walker for mobility.  Reportedly independent with ADLs.  Presented 05/28/2023 with chills/nausea with wound dehiscence/suspect sepsis with WBC 31,000 and underwent reexposure of right common femoral artery/popliteal artery with ligation of right axillobifemoral bypass graft excision of right common femoral artery excision of right femoral to above-knee popliteal artery bypass with above-knee popliteal artery ligation right sided greater saphenous vein saphenectomy as well as excision of right femoral to below-knee popliteal artery bypass 05/29/2023 per Dr. Rozanna Corner.  Wound VAC was placed to right groin.  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 Charlotte Cookey..  Hospital course acute blood loss anemia 8.9.  She was cleared to begin subcutaneous heparin  for DVT prophylaxis..  Patient initially placed on daptomycin  for wound coverage and plan to transition to doxycycline however patient with noted elevated CK to 3217 felt to be secondary to daptomycin  with infectious disease Dr. Artemio Larry consulted antibiotic transition to vancomycin  for now.  Therapy evaluations completed due to patient's decreased functional mobility was admitted for a comprehensive rehab program.  Review of Systems  Constitutional:  Negative for chills and fever.  HENT:  Negative for  hearing loss.   Eyes:  Negative for blurred vision and double vision.  Respiratory:  Negative for cough, shortness of breath and wheezing.   Cardiovascular:  Positive for leg swelling. Negative for chest pain and palpitations.  Gastrointestinal:  Positive for constipation. Negative for heartburn, nausea and vomiting.  Genitourinary:  Negative for dysuria, flank pain and hematuria.  Musculoskeletal:  Positive for joint pain.  Skin:  Negative for rash.  Neurological:  Positive for sensory change.  All other systems reviewed and are negative.  Past Medical History:  Diagnosis Date   Cervical polyp 09/18/2018   Critical lower limb ischemia (HCC)    Encounter for screening mammogram for breast cancer 05/05/2016   Fibrocystic breast changes of both breasts 04/29/2015   Hyperlipidemia    Hypertension    PAD (peripheral artery disease) (HCC) 08/23/2019   Stroke (HCC) 08/23/2019   Past Surgical History:  Procedure Laterality Date   ABDOMINAL AORTOGRAM W/LOWER EXTREMITY Bilateral 08/23/2019   Procedure: ABDOMINAL AORTOGRAM W/ Bilateral LOWER EXTREMITY [place];  Surgeon: Richrd Char, MD;  Location: [facility];  Service: Cardiovascular;  Laterality: Bilateral;   AMPUTATION Right 05/31/2023   Procedure: AMPUTATION, ABOVE KNEE SARTORIOUS MUSCLE FLAP;  Surgeon: Margherita Shell, MD;  Location: [facility] OR;  Service: Vascular;  Laterality: Right;   APPLICATION OF WOUND VAC Right 05/31/2023   Procedure: APPLICATION, WOUND VAC;  Surgeon: Margherita Shell, MD;  Location: [facility];  Service: Vascular;  Laterality: Right;   APPLICATION, SKIN SUBSTITUTE Right 05/12/2023   Procedure: APPLICATION, SKIN SUBSTITUTE KERECIS 38SQ CM;  Surgeon: Margherita Shell, MD;  Location: [facility];  Service: Vascular;  Laterality: Right;   AVGG REMOVAL Right 05/29/2023   Procedure: REMOVAL BYPASS GRAFT RIGHT LEG;  Surgeon: Kayla Part,  MD;  Location: [facility];  Service: Vascular;  Laterality: Right;   AXILLARY-FEMORAL BYPASS GRAFT  Right 11/11/2019   Procedure: BYPASS GRAFT RIGHT AXILLA-BIFEMORAL;  Surgeon: Richrd Char, MD;  Location: [facility];  Service: Vascular;  Laterality: Right;   BRAIN SURGERY     BREAST SURGERY Bilateral 2014   1980 R breast surgery   CEREBRAL ANEURYSM REPAIR     ENDARTERECTOMY FEMORAL Bilateral 11/11/2019   Procedure: ENDARTERECTOMY FEMORAL BILATERALLY;  Surgeon: Richrd Char, MD;  [facility];  Service: Vascular;  Laterality: Bilateral;   ENDARTERECTOMY FEMORAL Right 01/26/2022   Procedure: REDO RIGHT COMMON FEMORAL ENDARTERECTOMY USING 0.8CM X 7.6CM HEMASHIELD PLATINUM FINESSE PATCH;  Surgeon: Margherita Shell, MD;  Location: [facility];  Service: Vascular;  Laterality: Right;   FEMORAL-POPLITEAL BYPASS GRAFT Right 01/26/2022   Procedure: RIGHT FEMORAL-POPLITEAL BYPASS GRAFT USING GORE X 80CM PROPATEN GRAFT;  Surgeon: Margherita Shell, MD;  Location: [facility];  Service: Vascular;  Laterality: Right;   FEMORAL-POPLITEAL BYPASS GRAFT Right 05/12/2023   Procedure: BYPASS GRAFT FEMORAL-POPLITEAL ARTERY;  Surgeon: Margherita Shell, MD;  [facility]: [facility];  Service: Vascular;  Laterality: Right;   INCISION AND DRAINAGE OF WOUND Right 05/31/2023   Procedure: IRRIGATION AND DEBRIDEMENT WOUND;  Surgeon: Margherita Shell, MD;  [facility]: [facility];  Service: Vascular;  Laterality: Right;  RIGHT GROIN WASHOUT   IR RADIOLOGIST EVAL & MGMT  04/02/2020   IR RADIOLOGIST EVAL & MGMT  03/29/2021   THROMBECTOMY FEMORAL ARTERY Right 01/26/2022   Procedure: THROMBECTOMY RIGHT FEMORAL ARTERY;  Surgeon: Margherita Shell, MD;  [facility]: [facility];  Service: Vascular;  Laterality: Right;   THROMBECTOMY OF BYPASS GRAFT FEMORAL- TIBIAL ARTERY Right 05/12/2023   Procedure: THROMBECTOMY, BYPASS GRAFT, ARTERIAL;  Surgeon: Margherita Shell, MD;  Location: [facility];  Service: Vascular;  Laterality: Right;   TUBAL LIGATION  1990   Family History  Problem Relation Age of Onset   Heart Problems Father    Heart Problems Brother    Heart  Problems Brother    Social History:  reports that she quit smoking about 3 years ago. Her smoking use included cigarettes. She has never been exposed to tobacco smoke. She has never used smokeless tobacco. She reports that she does not drink alcohol  and does not use drugs. Allergies: No Active Allergies Medications Prior to Admission  Medication Sig Dispense Refill   alendronate (FOSAMAX) 70 MG tablet Take 70 mg by mouth once a week.     apixaban  (ELIQUIS ) 5 MG TABS tablet Take 1 tablet (5 mg total) by mouth 2 (two) times daily. 180 tablet 0   ezetimibe  (ZETIA ) 10 MG tablet Take 10 mg by mouth daily.     hydrALAZINE  (APRESOLINE ) 25 MG tablet Take 25 mg by mouth 3 (three) times daily as needed (for BP >180/100).     rosuvastatin  (CRESTOR ) 40 MG tablet Take 40 mg by mouth daily.     TIADYLT  ER 180 MG 24 hr capsule Take 180 mg by mouth daily.     valsartan (DIOVAN) 160 MG tablet Take 160 mg by mouth daily.     acetaminophen  (TYLENOL ) 325 MG tablet Take 2 tablets (650 mg total) by mouth every 4 (four) hours as needed for mild pain (pain score 1-3). (Patient not taking: Reported on 05/28/2023)     oxyCODONE -acetaminophen  (PERCOCET/ROXICET) 5-325 MG tablet Take 1 tablet by mouth every 8 (eight) hours as needed for severe pain (pain score 7-10). (Patient not taking: Reported  on 05/28/2023) 15 tablet 0   polyethylene glycol powder (GLYCOLAX /MIRALAX ) 17 GM/SCOOP powder Take 1 capful (17 g) by mouth daily as needed for mild constipation. 238 g 0      Home: Home Living Family/patient expects to be discharged to:: Private residence Living Arrangements: Children, Other relatives (daughter and grandson) Available Help at Discharge: Family, Available 24 hours/day Type of Home: Mobile home Home Access: Stairs to enter [employer] of Steps: 5 Entrance Stairs-Rails: Right, Left, Can reach both Home Layout: One level Bathroom Shower/Tub: Health visitor: Standard Bathroom  Accessibility: Yes Home Equipment: Cane - single point, Agricultural consultant (2 wheels)  Lives With: Daughter, Family   Functional History: Prior Function Prior Level of Function : Independent/Modified Independent Mobility Comments: Alternates between using SPC and RW. Reports no recent falls. ADLs Comments: Pt typically Independent with ADLs. Pt reports often taking a sponge bath due to the tub/shower bring small. Has family assist with driving  Functional Status:  Mobility: Bed Mobility Overal bed mobility: Needs Assistance Bed Mobility: Supine to Sit, Sit to Supine Supine to sit: Modified independent (Device/Increase time), Used rails, HOB elevated Sit to supine: Modified independent (Device/Increase time), HOB elevated, Used rails General bed mobility comments: extra time, exits R side of bed Transfers Overall transfer level: Needs assistance Equipment used: None Transfers: Bed to chair/wheelchair/BSC Sit to Stand: Contact guard assist, Min assist Bed to/from chair/wheelchair/BSC transfer type:: Squat pivot Squat pivot transfers: Contact guard assist Step pivot transfers: Contact guard assist General transfer comment: CGA to pivot from EOB<>BSC, good safety awareness and hand placement noted throughout transfer Ambulation/Gait Ambulation/Gait assistance: Contact guard assist Gait Distance (Feet): 18 Feet (x2) Assistive device: Rolling walker (2 wheels) Gait Pattern/deviations: Decreased step length - left, Decreased stride length, Step-to pattern General Gait Details: Pt ambulates in room with RW & CGA, good ability to use BUE to offload LLE so not hopping on L foot Gait velocity: decreased    ADL: ADL Overall ADL's : Needs assistance/impaired Eating/Feeding: Set up, Sitting Grooming: Wash/dry hands, Sitting, Set up Upper Body Bathing: Set up, Sitting Upper Body Bathing Details (indicate cue type and reason): from EOB Lower Body Bathing: Contact guard assist, Sitting/lateral  leans Lower Body Bathing Details (indicate cue type and reason): lateral leans to wash LB from EOB Upper Body Dressing : Minimal assistance, Sitting Upper Body Dressing Details (indicate cue type and reason): assist needed to manage tele wires Lower Body Dressing: Set up Lower Body Dressing Details (indicate cue type and reason): able to don sock via figure 4 with set- up assist from EOB Toilet Transfer: Contact guard assist, Squat-pivot, BSC/3in1 Toilet Transfer Details (indicate cue type and reason): squat pivot from EOB<>BSC Toileting- Clothing Manipulation and Hygiene: Sitting/lateral lean, Contact guard assist Toileting - Clothing Manipulation Details (indicate cue type and reason): completing anterior pericare via lateral leans with CGA Tub/Shower Transfer Details (indicate cue type and reason): discussed pt having walkin shower at home with lip, pts goal is to use crutches to transfer into shower Functional mobility during ADLs: Contact guard assist (squat pivot) General ADL Comments: ADL participation impacted by impaired balance  Cognition: Cognition Orientation Level: Oriented X4 Cognition Arousal: Alert Behavior During Therapy: WFL for tasks assessed/performed, Impulsive  Physical Exam: Blood pressure (!) 153/47, pulse 67, temperature 97.7 F (36.5 C), temperature source Oral, resp. rate 16, height 5\' 6"  (1.676 m), weight 65.9 kg, SpO2 100%. Physical Exam Skin:    Comments: Right AKA is dressed with wound VAC in place  Neurological:     Comments: Patient is alert and oriented x 3.     Results for orders placed or performed during the hospital encounter of 05/28/23 (from the past 48 hours)  Glucose, capillary     Status: Abnormal   Collection Time: 06/05/23 11:28 AM  Result Value Ref Range   Glucose-Capillary 111 (H) 70 - 99 mg/dL    Comment: Glucose reference range applies only to samples taken after fasting for at least 8 hours.  Glucose, capillary     Status:  Abnormal   Collection Time: 06/05/23  3:57 PM  Result Value Ref Range   Glucose-Capillary 114 (H) 70 - 99 mg/dL    Comment: Glucose reference range applies only to samples taken after fasting for at least 8 hours.  Glucose, capillary     Status: Abnormal   Collection Time: 06/05/23  9:14 PM  Result Value Ref Range   Glucose-Capillary 103 (H) 70 - 99 mg/dL    Comment: Glucose reference range applies only to samples taken after fasting for at least 8 hours.   Comment 1 Notify RN    Comment 2 Document in Chart   Renal function panel     Status: Abnormal   Collection Time: 06/06/23  5:30 AM  Result Value Ref Range   Sodium 133 (L) 135 - 145 mmol/L   Potassium 3.7 3.5 - 5.1 mmol/L   Chloride 100 98 - 111 mmol/L   CO2 26 22 - 32 mmol/L   Glucose, Bld 98 70 - 99 mg/dL    Comment: Glucose reference range applies only to samples taken after fasting for at least 8 hours.   BUN 8 8 - 23 mg/dL   Creatinine, Ser 1.61 0.44 - 1.00 mg/dL   Calcium  8.6 (L) 8.9 - 10.3 mg/dL   Phosphorus 3.2 2.5 - 4.6 mg/dL   Albumin  1.8 (L) 3.5 - 5.0 g/dL   GFR, Estimated >09 >60 mL/min    Comment: (NOTE) Calculated using the CKD-EPI Creatinine Equation (2021)    Anion gap 7 5 - 15    Comment: Performed at [facility], [address], [place], [place] [address]  CK     Status: Abnormal   Collection Time: 06/06/23  5:30 AM  Result Value Ref Range   Total CK 3,217 (H) 38 - 234 U/L    Comment: Performed at [facility], [address] [place], [place] [address]  CBC     Status: Abnormal   Collection Time: 06/06/23  5:30 AM  Result Value Ref Range   WBC 12.8 (H) 4.0 - 10.5 K/uL   RBC 2.95 (L) 3.87 - 5.11 MIL/uL   Hemoglobin 8.8 (L) 12.0 - 15.0 g/dL   HCT 47.8 (L) 29.5 - 62.1 %   MCV 91.2 80.0 - 100.0 fL   MCH 29.8 26.0 - 34.0 pg   MCHC 32.7 30.0 - 36.0 g/dL   RDW 30.8 65.7 - 84.6 %   Platelets 270 150 - 400 K/uL   nRBC 0.0 0.0 - 0.2 %    Comment: Performed at [facility], [address], [place], [place] [address]  Glucose, capillary     Status: None   Collection Time: 06/06/23  6:08 AM  Result Value Ref Range   Glucose-Capillary 96 70 - 99 mg/dL    Comment: Glucose reference range applies only to samples taken after fasting for at least 8 hours.  Glucose, capillary     Status: Abnormal  Collection Time: 06/06/23 11:19 AM  Result Value Ref Range   Glucose-Capillary 117 (H) 70 - 99 mg/dL    Comment: Glucose reference range applies only to samples taken after fasting for at least 8 hours.  Glucose, capillary     Status: Abnormal   Collection Time: 06/06/23  4:04 PM  Result Value Ref Range   Glucose-Capillary 139 (H) 70 - 99 mg/dL    Comment: Glucose reference range applies only to samples taken after fasting for at least 8 hours.  Glucose, capillary     Status: Abnormal   Collection Time: 06/06/23 10:23 PM  Result Value Ref Range   Glucose-Capillary 115 (H) 70 - 99 mg/dL    Comment: Glucose reference range applies only to samples taken after fasting for at least 8 hours.  Renal function panel     Status: Abnormal   Collection Time: 06/07/23  5:00 AM  Result Value Ref Range   Sodium 132 (L) 135 - 145 mmol/L   Potassium 4.0 3.5 - 5.1 mmol/L   Chloride 99 98 - 111 mmol/L   CO2 26 22 - 32 mmol/L   Glucose, Bld 98 70 - 99 mg/dL    Comment: Glucose reference range applies only to samples taken after fasting for at least 8 hours.   BUN 11 8 - 23 mg/dL   Creatinine, Ser 9.62 0.44 - 1.00 mg/dL   Calcium  8.7 (L) 8.9 - 10.3 mg/dL   Phosphorus 3.6 2.5 - 4.6 mg/dL   Albumin  1.9 (L) 3.5 - 5.0 g/dL   GFR, Estimated >95 >28 mL/min    Comment: (NOTE) Calculated using the CKD-EPI Creatinine Equation (2021)    Anion gap 7 5 - 15    Comment: Performed at [facility], [address], [place], [place] [address]  CBC     Status: Abnormal   Collection Time: 06/07/23  5:50 AM  Result Value Ref Range   WBC 15.6 (H) 4.0 - 10.5 K/uL   RBC 2.99 (L) 3.87 - 5.11 MIL/uL    Hemoglobin 8.9 (L) 12.0 - 15.0 g/dL   HCT 40.1 (L) 02.7 - 25.3 %   MCV 93.0 80.0 - 100.0 fL   MCH 29.8 26.0 - 34.0 pg   MCHC 32.0 30.0 - 36.0 g/dL   RDW 66.4 40.3 - 47.4 %   Platelets 303 150 - 400 K/uL   nRBC 0.0 0.0 - 0.2 %    Comment: Performed at [facility], [address] [place], [place] [address]  Glucose, capillary     Status: Abnormal   Collection Time: 06/07/23  6:28 AM  Result Value Ref Range   Glucose-Capillary 101 (H) 70 - 99 mg/dL    Comment: Glucose reference range applies only to samples taken after fasting for at least 8 hours.  Glucose, capillary     Status: Abnormal   Collection Time: 06/07/23 10:56 AM  Result Value Ref Range   Glucose-Capillary 113 (H) 70 - 99 mg/dL    Comment: Glucose reference range applies only to samples taken after fasting for at least 8 hours.   No results found.    Blood pressure (!) 153/47, pulse 67, temperature 97.7 F (36.5 C), temperature source Oral, resp. rate 16, height 5\' 6"  (1.676 m), weight 65.9 kg, SpO2 100%.  Medical Problem List and Plan: 1. Functional deficits secondary to right AKA 05/31/2023 after failed revascularization procedure  -patient may *** shower  -ELOS/Goals: *** 2.  Antithrombotics: -DVT/anticoagulation:  Pharmaceutical: Heparin   -  antiplatelet therapy: Aspirin  81 mg daily 3. Pain Management: Neurontin  300 mg twice daily, oxycodone  as needed 4. Mood/Behavior/Sleep: Provide emotional support  -antipsychotic agents: N/A 5. Neuropsych/cognition: This patient is capable of making decisions on her own behalf. 6. Skin/Wound Care: Wound VAC right groin.  Follow-up wound care nurse. 7. Fluids/Electrolytes/Nutrition: Routine follow-up and outs follow-up chemistries 8.  Postoperative anemia.  Follow-up CBC 9.ID/Sepsis.  Daptomycin  changed to vancomycin  due to elevated CK.  Follow-up infectious disease.  Await long-term plan of care on antibiotic 10.  Hypertension.  Avapro  75 mg daily, Norvasc  10 mg daily,  hydralazine  25 mg every 8 hours, HCTZ 12.5 mg daily.  Monitor with increased mobility 11.  Hyperlipidemia.  Will discuss holding Crestor  with pharmacy due to elevated CK.    Everlyn Hockey Bence Trapp, PA-C 06/07/2023

## 2023-06-07 NOTE — Progress Notes (Signed)
 Occupational Therapy Treatment Patient Details Name: Katherine Archer MRN: [id number] DOB: February 25, 1955 Today's Date: 06/07/2023   History of present illness 68 y.o. female presents to [facility] 05/28/23 with worsening RLE pain/swelling and dizziness, s/p R fem-pop BGD on 5/8. Admitted w/ septic shock 2/2 infected R fem pop bypass and anemia. 5/26 R LE graft excision and washout. 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 ICU stay 5/26-5/30. PMHx: PAD, axillobifemoral bypass, multiple RLE procedures, stroke, HTN, HLD.   OT comments  Pt making steady progress towards OT goals this session. Pt continues to present with impaired balance impacting pts ability to complete ADLs independently. Session focus on sitting balance, transfer training and BADL reeducation.  Pt currently requires CGA for ADL transfers, set- up for UB ADLS and CGA for LB ADLs. Patient will benefit from intensive inpatient follow-up therapy, >3 hours/day. Will continue to follow acutely.         If plan is discharge home, recommend the following:  A little help with walking and/or transfers;Assistance with cooking/housework;Assist for transportation;Help with stairs or ramp for entrance;A lot of help with bathing/dressing/bathroom   Equipment Recommendations  Other (comment) (defer)    Recommendations for Other Services      Precautions / Restrictions Precautions Precautions: Fall Recall of Precautions/Restrictions: Intact Precaution/Restrictions Comments: R AKA, wound vac Restrictions Weight Bearing Restrictions Per Provider Order: Yes RLE Weight Bearing Per Provider Order: Non weight bearing       Mobility Bed Mobility Overal bed mobility: Needs Assistance Bed Mobility: Supine to Sit, Sit to Supine     Supine to sit: Modified independent (Device/Increase time), Used rails, HOB elevated Sit to supine: Modified independent (Device/Increase time), HOB elevated, Used rails        Transfers Overall transfer level: Needs  assistance Equipment used: None Transfers: Bed to chair/wheelchair/BSC     Squat pivot transfers: Contact guard assist       General transfer comment: CGA to pivot from EOB<>BSC, good safety awareness and hand placement noted throughout transfer     Balance Overall balance assessment: Needs assistance Sitting-balance support: No upper extremity supported, Feet supported Sitting balance-Leahy Scale: Good Sitting balance - Comments: sitting EOB ~ 8 mins for bathing with no LOB   Standing balance support: Bilateral upper extremity supported, During functional activity, Reliant on assistive device for balance Standing balance-Leahy Scale: Poor Standing balance comment: requires BUE support for balance                           ADL either performed or assessed with clinical judgement   ADL Overall ADL's : Needs assistance/impaired     Grooming: Wash/dry hands;Sitting;Set up   Upper Body Bathing: Set up;Sitting Upper Body Bathing Details (indicate cue type and reason): from EOB Lower Body Bathing: Contact guard assist;Sitting/lateral leans Lower Body Bathing Details (indicate cue type and reason): lateral leans to wash LB from EOB Upper Body Dressing : Minimal assistance;Sitting Upper Body Dressing Details (indicate cue type and reason): assist needed to manage tele wires Lower Body Dressing: Set up Lower Body Dressing Details (indicate cue type and reason): able to don sock via figure 4 with set- up assist from EOB Toilet Transfer: Contact guard assist;Squat-pivot;BSC/3in1 Toilet Transfer Details (indicate cue type and reason): squat pivot from EOB<>BSC Toileting- Clothing Manipulation and Hygiene: Sitting/lateral lean;Contact guard assist Toileting - Clothing Manipulation Details (indicate cue type and reason): completing anterior pericare via lateral leans with CGA   Tub/Shower Transfer Details (indicate cue type  and reason): discussed pt having walkin shower at home  with lip, pts goal is to use crutches to transfer into shower Functional mobility during ADLs: Contact guard assist (squat pivot) General ADL Comments: ADL participation impacted by impaired balance    Extremity/Trunk Assessment Upper Extremity Assessment Upper Extremity Assessment: Overall WFL for tasks assessed   Lower Extremity Assessment Lower Extremity Assessment: Defer to PT evaluation   Cervical / Trunk Assessment Cervical / Trunk Assessment: Normal    Vision Baseline Vision/History: 0 No visual deficits Ability to See in Adequate Light: 0 Adequate Patient Visual Report: No change from baseline Vision Assessment?: No apparent visual deficits   Perception Perception Perception: Not tested   Praxis Praxis Praxis: Not tested   Communication Communication Communication: No apparent difficulties;Other (comment) (daughter present; interpreting session)   Cognition Arousal: Alert Behavior During Therapy: WFL for tasks assessed/performed, Impulsive Cognition: No apparent impairments                               Following commands: Intact        Cueing   Cueing Techniques: Verbal cues  Exercises      Shoulder Instructions       General Comments daughter present during session interpreting, noted mild drainage from R residual limb, donned gauze and tape with nurse aware    Pertinent Vitals/ Pain       Pain Assessment Pain Assessment: No/denies pain  Home Living                                          Prior Functioning/Environment              Frequency  Min 2X/week        Progress Toward Goals  OT Goals(current goals can now be found in the care plan section)  Progress towards OT goals: Progressing toward goals  Acute Rehab OT Goals Patient Stated Goal: to go to rehab OT Goal Formulation: With patient Time For Goal Achievement: 06/18/23 Potential to Achieve Goals: Good  Plan      Co-evaluation                  AM-PAC OT "6 Clicks" Daily Activity     Outcome Measure   Help from another person eating meals?: None Help from another person taking care of personal grooming?: A Little Help from another person toileting, which includes using toliet, bedpan, or urinal?: A Little Help from another person bathing (including washing, rinsing, drying)?: A Little Help from another person to put on and taking off regular upper body clothing?: A Little Help from another person to put on and taking off regular lower body clothing?: A Little 6 Click Score: 19    End of Session Equipment Utilized During Treatment: Gait belt;Other (comment) (BSC)  OT Visit Diagnosis: Unsteadiness on feet (R26.81)   Activity Tolerance Patient tolerated treatment well   Patient Left in bed;with call bell/phone within reach;with bed alarm set;with family/visitor present   Nurse Communication Mobility status;Other (comment) (gauze applied to R AKA)        Time: 1027-2536 OT Time Calculation (min): 32 min  Charges: OT General Charges $OT Visit: 1 Visit OT Treatments $Self Care/Home Management : 23-37 mins  Curry Dove., COTA/L Acute Rehabilitation Services [phone]  Katherine Archer 06/07/2023, 10:14 AM

## 2023-06-07 NOTE — Progress Notes (Signed)
 Physical Medicine and Rehabilitation Consult Reason for Consult:right AKA Referring Physician: Danford     HPI: Katherine Archer is a 68 y.o. female with a history of hypertension as well as PAD with multiple bypass surgeries in the past status post right femoropopliteal bypass graft on 05/11/2023.  Patient was readmitted on 05/29/2023 with septic shock due to infected surgical area.  A graft excision and washout was performed on the same day.  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  Eliquis  was stopped after amputation as it was being used for her PAD.  Vacuum in place for right groin wound.  Patient last got up with physical therapy over the weekend and was min assist +2 for sit to stand transfers and pivoted using heel-toe method with progression of the small hops using rolling walker.  Occupational Therapy worked with the patient yesterday and she was min to mod assist for basic ADL tasks.  Patient was independent prior to arrival using a single-point cane and rolling walker.  She lives with children who are available for assistance at home.  She has a 1 level house with 5 steps to enter.       Home: Home Living Family/patient expects to be discharged to:: Private residence Living Arrangements: Children, Other relatives (daughter and grandson) Available Help at Discharge: Family, Available 24 hours/day Type of Home: Mobile home Home Access: Stairs to enter [employer] of Steps: 5 Entrance Stairs-Rails: Right, Left, Can reach both Home Layout: One level Bathroom Shower/Tub: Health visitor: Standard Bathroom Accessibility: Yes Home Equipment: Cane - single point, Agricultural consultant (2 wheels)  Lives With: Daughter, Family  Functional History: Prior Function Prior Level of Function :  Independent/Modified Independent Mobility Comments: Alternates between using SPC and RW. Reports no recent falls. ADLs Comments: Pt typically Independent with ADLs. Pt reports often taking a sponge bath due to the tub/shower bring small. Has family assist with driving Functional Status:  Mobility: Bed Mobility Overal bed mobility: Needs Assistance Bed Mobility: Supine to Sit Supine to sit: Min assist General bed mobility comments: ModA to shift hips towards EOB with bed pad. Pt able to move LE's towards EOB with slight assist for trunk elevation Transfers Overall transfer level: Needs assistance Equipment used: Rolling walker (2 wheels) Transfers: Sit to/from Stand, Bed to chair/wheelchair/BSC Sit to Stand: Mod assist Bed to/from chair/wheelchair/BSC transfer type:: Step pivot Step pivot transfers: Min assist, +2 safety/equipment General transfer comment: x2 and takes 4-5 hops forward with close chair follow   ADL: ADL Overall ADL's : Needs assistance/impaired Eating/Feeding: Set up, Sitting Grooming: Set up, Sitting Upper Body Bathing: Minimal assistance, Sitting Lower Body Bathing: Minimal assistance, Sitting/lateral leans, Moderate assistance Upper Body Dressing : Minimal assistance, Sitting Lower Body Dressing: Minimal assistance, Moderate assistance Toilet Transfer: Moderate assistance, Stand-pivot, BSC/3in1, Rolling walker (2 wheels) Toileting- Clothing Manipulation and Hygiene: Contact guard assist Functional mobility during ADLs: Moderate assistance, Rolling walker (2 wheels)   Cognition: Cognition Orientation Level: Oriented X4 Cognition Arousal: Alert Behavior During Therapy: WFL for tasks assessed/performed     Review of Systems  Constitutional:  Negative for fever.  HENT: Negative.    Eyes: Negative.   Respiratory: Negative.    Cardiovascular: Negative.   Gastrointestinal: Negative.   Genitourinary: Negative.   Musculoskeletal:  Positive for joint pain.   Skin:  Negative for rash.  Neurological:  Positive for sensory  change.  Psychiatric/Behavioral:  Negative for substance abuse. The patient does not have insomnia.         Past Medical History:  Diagnosis Date   Cervical polyp 09/18/2018   Critical lower limb ischemia (HCC)     Encounter for screening mammogram for breast cancer 05/05/2016   Fibrocystic breast changes of both breasts 04/29/2015   Hyperlipidemia     Hypertension     PAD (peripheral artery disease) (HCC) 08/23/2019   Stroke (HCC) 08/23/2019             Past Surgical History:  Procedure Laterality Date   ABDOMINAL AORTOGRAM W/LOWER EXTREMITY Bilateral 08/23/2019    Procedure: ABDOMINAL AORTOGRAM W/ Bilateral LOWER EXTREMITY [place];  Surgeon: Richrd Char, MD;  Location: [facility];  Service: Cardiovascular;  Laterality: Bilateral;   AMPUTATION Right 05/31/2023    Procedure: AMPUTATION, ABOVE KNEE SARTORIOUS MUSCLE FLAP;  Surgeon: Margherita Shell, MD;  Location: [facility] OR;  Service: Vascular;  Laterality: Right;   APPLICATION OF WOUND VAC Right 05/31/2023    Procedure: APPLICATION, WOUND VAC;  Surgeon: Margherita Shell, MD;  Location: [facility] OR;  Service: Vascular;  Laterality: Right;   APPLICATION, SKIN SUBSTITUTE Right 05/12/2023    Procedure: APPLICATION, SKIN SUBSTITUTE KERECIS 38SQ CM;  Surgeon: Margherita Shell, MD;  Location: [facility] OR;  Service: Vascular;  Laterality: Right;   AVGG REMOVAL Right 05/29/2023    Procedure: REMOVAL BYPASS GRAFT RIGHT LEG;  Surgeon: Kayla Part, MD;  Location: [facility] OR;  Service: Vascular;  Laterality: Right;   AXILLARY-FEMORAL BYPASS GRAFT Right 11/11/2019    Procedure: BYPASS GRAFT RIGHT AXILLA-BIFEMORAL;  Surgeon: Richrd Char, MD;  Location: [facility] OR;  Service: Vascular;  Laterality: Right;   BRAIN SURGERY       BREAST SURGERY Bilateral 2014    1980 R breast surgery   CEREBRAL ANEURYSM REPAIR       ENDARTERECTOMY FEMORAL Bilateral 11/11/2019    Procedure: ENDARTERECTOMY FEMORAL  BILATERALLY;  Surgeon: Richrd Char, MD;  Location: [facility] OR;  Service: Vascular;  Laterality: Bilateral;   ENDARTERECTOMY FEMORAL Right 01/26/2022    Procedure: REDO RIGHT COMMON FEMORAL ENDARTERECTOMY USING 0.8CM X 7.6CM HEMASHIELD PLATINUM FINESSE PATCH;  Surgeon: Margherita Shell, MD;  Location: [facility] OR;  Service: Vascular;  Laterality: Right;   FEMORAL-POPLITEAL BYPASS GRAFT Right 01/26/2022    Procedure: RIGHT FEMORAL-POPLITEAL BYPASS GRAFT USING GORE X 80CM PROPATEN GRAFT;  Surgeon: Margherita Shell, MD;  Location: [facility] OR;  Service: Vascular;  Laterality: Right;   FEMORAL-POPLITEAL BYPASS GRAFT Right 05/12/2023    Procedure: BYPASS GRAFT FEMORAL-POPLITEAL ARTERY;  Surgeon: Margherita Shell, MD;  Location: [facility] OR;  Service: Vascular;  Laterality: Right;   INCISION AND DRAINAGE OF WOUND Right 05/31/2023    Procedure: IRRIGATION AND DEBRIDEMENT WOUND;  Surgeon: Margherita Shell, MD;  Location: [facility] OR;  Service: Vascular;  Laterality: Right;  RIGHT GROIN WASHOUT   IR RADIOLOGIST EVAL & MGMT   04/02/2020   IR RADIOLOGIST EVAL & MGMT   03/29/2021   THROMBECTOMY FEMORAL ARTERY Right 01/26/2022    Procedure: THROMBECTOMY RIGHT FEMORAL ARTERY;  Surgeon: Margherita Shell, MD;  Location: [facility] OR;  Service: Vascular;  Laterality: Right;   THROMBECTOMY OF BYPASS GRAFT FEMORAL- TIBIAL ARTERY Right 05/12/2023    Procedure: THROMBECTOMY, BYPASS GRAFT, ARTERIAL;  Surgeon: Margherita Shell, MD;  Location: [facility] OR;  Service: Vascular;  Laterality: Right;   TUBAL LIGATION   1990  Family History  Problem Relation Age of Onset   Heart Problems Father     Heart Problems Brother     Heart Problems Brother          Social History:  reports that she quit smoking about 3 years ago. Her smoking use included cigarettes. She has never been exposed to tobacco smoke. She has never used smokeless tobacco. She reports that she does not drink alcohol  and does not use drugs. Allergies:  Allergies  No Active  Allergies         Medications Prior to Admission  Medication Sig Dispense Refill   alendronate (FOSAMAX) 70 MG tablet Take 70 mg by mouth once a week.       apixaban  (ELIQUIS ) 5 MG TABS tablet Take 1 tablet (5 mg total) by mouth 2 (two) times daily. 180 tablet 0   ezetimibe  (ZETIA ) 10 MG tablet Take 10 mg by mouth daily.       hydrALAZINE  (APRESOLINE ) 25 MG tablet Take 25 mg by mouth 3 (three) times daily as needed (for BP >180/100).       rosuvastatin  (CRESTOR ) 40 MG tablet Take 40 mg by mouth daily.       TIADYLT  ER 180 MG 24 hr capsule Take 180 mg by mouth daily.       valsartan (DIOVAN) 160 MG tablet Take 160 mg by mouth daily.       acetaminophen  (TYLENOL ) 325 MG tablet Take 2 tablets (650 mg total) by mouth every 4 (four) hours as needed for mild pain (pain score 1-3). (Patient not taking: Reported on 05/28/2023)       oxyCODONE -acetaminophen  (PERCOCET/ROXICET) 5-325 MG tablet Take 1 tablet by mouth every 8 (eight) hours as needed for severe pain (pain score 7-10). (Patient not taking: Reported on 05/28/2023) 15 tablet 0   polyethylene glycol powder (GLYCOLAX /MIRALAX ) 17 GM/SCOOP powder Take 1 capful (17 g) by mouth daily as needed for mild constipation. 238 g 0            Blood pressure (!) 151/66, pulse 70, temperature (!) 97.5 F (36.4 C), temperature source Oral, resp. rate 18, height 5\' 6"  (1.676 m), weight 69.4 kg, SpO2 97%. Physical Exam Constitutional:      Appearance: She is not ill-appearing.  HENT:     Head: Normocephalic.     Right Ear: There is no impacted cerumen.     Left Ear: There is no impacted cerumen.     Nose: Nose normal.     Mouth/Throat:     Pharynx: Oropharynx is clear.  Eyes:     Pupils: Pupils are equal, round, and reactive to light.  Cardiovascular:     Rate and Rhythm: Normal rate.     Pulses: Normal pulses.  Pulmonary:     Effort: Pulmonary effort is normal.  Abdominal:     Palpations: Abdomen is soft.  Musculoskeletal:     Cervical back:  Normal range of motion.     Comments: Early pes planus deformity left foot. Callus on fallen arch as well as 1st MT head which are tender.  Skin:    Comments: Right AK incision well-approximated but with bruising/blistering along medial half of incision and more proximally on leg. Vac in right groin  Neurological:     Mental Status: She is alert.     Comments: Alert and oriented x 3. Normal insight and awareness. Intact Memory. Normal language and speech. Cranial nerve exam unremarkable. MMT: BUE 5/5. RLE 3/5 HF. LLE 4-HF  and KE and 4/5 ADF/PF. Decreased LT in left foot. DTR's tr. No abnl resting tone.[name]    Psychiatric:        Mood and Affect: Mood normal.        Behavior: Behavior normal.        Lab Results Last 24 Hours       Results for orders placed or performed during the hospital encounter of 05/28/23 (from the past 24 hours)  Glucose, capillary     Status: None    Collection Time: 06/04/23 12:44 PM  Result Value Ref Range    Glucose-Capillary 94 70 - 99 mg/dL  Glucose, capillary     Status: Abnormal    Collection Time: 06/04/23  5:00 PM  Result Value Ref Range    Glucose-Capillary 102 (H) 70 - 99 mg/dL  Glucose, capillary     Status: None    Collection Time: 06/04/23  9:22 PM  Result Value Ref Range    Glucose-Capillary 96 70 - 99 mg/dL  CBC     Status: Abnormal    Collection Time: 06/05/23  5:00 AM  Result Value Ref Range    WBC 14.8 (H) 4.0 - 10.5 K/uL    RBC 3.17 (L) 3.87 - 5.11 MIL/uL    Hemoglobin 9.6 (L) 12.0 - 15.0 g/dL    HCT 60.4 (L) 54.0 - 46.0 %    MCV 90.2 80.0 - 100.0 fL    MCH 30.3 26.0 - 34.0 pg    MCHC 33.6 30.0 - 36.0 g/dL    RDW 98.1 19.1 - 47.8 %    Platelets 261 150 - 400 K/uL    nRBC 0.0 0.0 - 0.2 %  Glucose, capillary     Status: None    Collection Time: 06/05/23  5:42 AM  Result Value Ref Range    Glucose-Capillary 91 70 - 99 mg/dL  Renal function panel     Status: Abnormal    Collection Time: 06/05/23  6:00 AM  Result Value Ref Range     Sodium 130 (L) 135 - 145 mmol/L    Potassium 3.6 3.5 - 5.1 mmol/L    Chloride 98 98 - 111 mmol/L    CO2 24 22 - 32 mmol/L    Glucose, Bld 91 70 - 99 mg/dL    BUN <5 (L) 8 - 23 mg/dL    Creatinine, Ser 2.95 0.44 - 1.00 mg/dL    Calcium  8.6 (L) 8.9 - 10.3 mg/dL    Phosphorus 2.8 2.5 - 4.6 mg/dL    Albumin  1.9 (L) 3.5 - 5.0 g/dL    GFR, Estimated >62 >13 mL/min    Anion gap 8 5 - 15  Glucose, capillary     Status: Abnormal    Collection Time: 06/05/23 11:28 AM  Result Value Ref Range    Glucose-Capillary 111 (H) 70 - 99 mg/dL      Imaging Results (Last 48 hours)  No results found.     Assessment/Plan: Diagnosis: 68 year old female status post right above-knee amputation Does the need for close, 24 hr/day medical supervision in concert with the patient's rehab needs make it unreasonable for this patient to be served in a less intensive setting? Yes Co-Morbidities requiring supervision/potential complications:  -Septic shock/infectious disease considerations -Wound care considerations that both the stump and groin -Hypertension -Hyponatremia -Postop pain, phantom limb pain, pre-prosthetic education -orthotic considerations for left foot Due to bladder management, bowel management, safety, skin/wound care, disease management, medication administration, pain management, and patient education, does  the patient require 24 hr/day rehab nursing? Yes Does the patient require coordinated care of a physician, rehab nurse, therapy disciplines of PT, OT to address physical and functional deficits in the context of the above medical diagnosis(es)? Yes Addressing deficits in the following areas: balance, endurance, locomotion, strength, transferring, bowel/bladder control, bathing, dressing, feeding, grooming, toileting, and psychosocial support Can the patient actively participate in an intensive therapy program of at least 3 hrs of therapy per day at least 5 days per week? Yes The potential for  patient to make measurable gains while on inpatient rehab is excellent Anticipated functional outcomes upon discharge from inpatient rehab are supervision  with PT, supervision with OT at w/c level. Estimated rehab length of stay to reach the above functional goals is: 8-11 days Anticipated discharge destination: Home Overall Rehab/Functional Prognosis: excellent   POST ACUTE RECOMMENDATIONS: This patient's condition is appropriate for continued rehabilitative care in the following setting: CIR Patient has agreed to participate in recommended program. Yes Note that insurance prior authorization may be required for reimbursement for recommended care.   Comment: Pt is motivated. Has supportive family.     MEDICAL RECOMMENDATIONS: Lovenox 40mg  every day for dvt proph Might benefit from orthotic shoe to preserve skin/arch left foot. Titrate gabapentin  to TID if needed for phantom limb pain     I have personally performed a face to face diagnostic evaluation of this patient. Additionally, I have examined the patient's medical record including any pertinent labs and radiographic images.     Thanks,   Rawland Caddy, MD 06/05/2023       Revision History  Routing History

## 2023-06-07 NOTE — Progress Notes (Signed)
 PMR Admission Coordinator Pre-Admission Assessment   Patient: Katherine Archer is an 68 y.o., female MRN: [id number] DOB: 1955/12/04 Height: 5\' 6"  (167.6 cm) Weight: 65.9 kg                                                                                                                                               Insurance Information HMO:     PPO: yes     PCP:      IPA:      80/20:      OTHER:  PRIMARY: [employer] Medicare      Policy#: [id number]      Subscriber: patient CM Name: Jyl Or      Phone#: [phone]     Fax#: [phone] Pre-Cert#: [id number]   approved 6/3 until 6/9   Employer:  Benefits:  Phone #: [phone]     Name:  Eff. Date: 01/04/23-still active     Deduct: does not have      Out of Pocket Max: $4,150 ($1,325 met)      Life Max: NA  CIR: $300/day co-pay with a max co-pay of $1,800/admission      SNF: $10/day co-pay for days 1-20, $214/day co-pay for days 21-100 Outpatient: $10 do-pay/visit     Co-Pay:  Home Health: 100% coverage      Co-Pay:  DME: 80% coverage     Co-Pay: 20% co-insurance Providers: in-network  SECONDARY:[place]           Policy#:   [id number] s    Phone#:    Artist:       Phone#:    The Engineer, materials Information Summary" for patients in Inpatient Rehabilitation Facilities with attached "Privacy Act Statement-Health Care Records" was provided and verbally reviewed with: Family   Emergency Contact Information Contact Information       Name Relation Home Work Mobile    Revera,Melody Granddaughter     [phone]    Lourdez, Mcgahan Sister     [phone]    Revera,Annie Daughter [phone]         Other Contacts       Name Relation Home Work Mobile    Henderson,Luz Daughter     [phone]         Current Medical History  Patient Admitting Diagnosis: right AKA   History of Present Illness: Pt is a 68 year old female with medical hx significant for: PAD on Eliquis  s/p redo right femoral artery  exposure with thrombectomy, right popliteal artery exposure with bypass graft on 5/9, HTN, hyperlipidemia, stroke. Pt presented to [facility] on 05/28/23 with fevers and LE swelling. Pt reported intermittent nausea and vomiting with some diarrhea. Pt reported pain in right groin and leg. Also reported wounds have opened and started to have purulent drainage. Pt admitted with septic shock d/t infected right fem-pop bypass. Pt's Hgb was  4.4. Pt transfused 3 units PRBC. Vascular surgery consulted. Pt underwent graft excision and washout by Dr. Rosalva Comber on 5/26. Pt underwent right groin wash out and right AKA by Dr. Charlotte Cookey on 05/31/23. Therapy evaluations completed and CIR recommended d/t pt's deficits in functional mobility.  Glasgow Coma Scale Score: 15   Patient's medical record from [facility] has been reviewed by the rehabilitation admission coordinator and physician.   Past Medical History      Past Medical History:  Diagnosis Date   Cervical polyp 09/18/2018   Critical lower limb ischemia (HCC)     Encounter for screening mammogram for breast cancer 05/05/2016   Fibrocystic breast changes of both breasts 04/29/2015   Hyperlipidemia     Hypertension     PAD (peripheral artery disease) (HCC) 08/23/2019   Stroke (HCC) 08/23/2019        Has the patient had major surgery during 100 days prior to admission? Yes   Family History  family history includes Heart Problems in her brother, brother, and father.   Current Medications   Current Medications    Current Facility-Administered Medications:    0.9 %  sodium chloride  infusion (Manually program via Guardrails IV Fluids), , Intravenous, Once, Rhyne, Samantha J, PA-C, Held at 05/28/23 2239   0.9 %  sodium chloride  infusion, 10 mL/hr, Intravenous, Once, Rhyne, Samantha J, PA-C, Last Rate: 10 mL/hr at 06/07/23 0402, 10 mL/hr at 06/07/23 0402   acetaminophen  (TYLENOL ) tablet 650 mg, 650 mg, Oral, Q6H PRN, Rhyne, Samantha J, PA-C, 650  mg at 05/30/23 1259   amLODipine  (NORVASC ) tablet 10 mg, 10 mg, Oral, Daily, Danford, Willis Harter, MD, 10 mg at 06/07/23 4259   aspirin  EC tablet 81 mg, 81 mg, Oral, Daily, Danford, Willis Harter, MD, 81 mg at 06/07/23 5638   Chlorhexidine  Gluconate Cloth 2 % PADS 6 each, 6 each, Topical, Daily, Rhyne, Samantha J, PA-C, 6 each at 06/07/23 7564   docusate sodium  (COLACE) capsule 100 mg, 100 mg, Oral, BID PRN, Rhyne, Samantha J, PA-C, 100 mg at 06/06/23 3329   ezetimibe  (ZETIA ) tablet 10 mg, 10 mg, Oral, Daily, Harris, Whitney D, NP, 10 mg at 06/07/23 5188   feeding supplement (ENSURE PLUS HIGH PROTEIN) liquid 237 mL, 237 mL, Oral, BID BM, Parker, Alicia C, NP, 237 mL at 06/07/23 4166   gabapentin  (NEURONTIN ) capsule 300 mg, 300 mg, Oral, BID, Margherita Shell, MD, 300 mg at 06/07/23 0820   heparin  injection 5,000 Units, 5,000 Units, Subcutaneous, Q8H, Rhyne, Samantha J, PA-C, 5,000 Units at 06/07/23 0550   hydrALAZINE  (APRESOLINE ) injection 10-40 mg, 10-40 mg, Intravenous, Q4H PRN, Rhyne, Samantha J, PA-C, 20 mg at 06/02/23 2204   hydrALAZINE  (APRESOLINE ) tablet 25 mg, 25 mg, Oral, Q8H, Harris, Whitney D, NP, 25 mg at 06/07/23 0550   hydrochlorothiazide  (HYDRODIURIL ) tablet 12.5 mg, 12.5 mg, Oral, Daily, Danford, Willis Harter, MD, 12.5 mg at 06/07/23 0820   HYDROmorphone  (DILAUDID ) injection 1 mg, 1 mg, Intravenous, Q3H PRN, Rhyne, Samantha J, PA-C, 1 mg at 06/06/23 2242   insulin  aspart (novoLOG ) injection 0-9 Units, 0-9 Units, Subcutaneous, TID WC, Rhyne, Samantha J, PA-C, 1 Units at 06/06/23 1627   irbesartan  (AVAPRO ) tablet 75 mg, 75 mg, Oral, Daily, Rhyne, Samantha J, PA-C, 75 mg at 06/07/23 0630   ondansetron  (ZOFRAN ) injection 4 mg, 4 mg, Intravenous, Q6H PRN, Rhyne, Samantha J, PA-C, 4 mg at 06/01/23 0348   Oral care mouth rinse, 15 mL, Mouth Rinse, PRN, Josiah Nigh, MD  oxyCODONE  (Oxy IR/ROXICODONE ) immediate release tablet 5-10 mg, 5-10 mg, Oral, Q6H PRN, Rhyne, Samantha J, PA-C, 10  mg at 06/07/23 0249   polyethylene glycol (MIRALAX  / GLYCOLAX ) packet 17 g, 17 g, Oral, Daily PRN, Rhyne, Samantha J, PA-C, 17 g at 06/06/23 0917   [START ON 06/09/2023] rosuvastatin  (CRESTOR ) tablet 40 mg, 40 mg, Oral, Daily, Danford, Willis Harter, MD   sodium chloride  flush (NS) 0.9 % injection 10-40 mL, 10-40 mL, Intracatheter, Q12H, Rhyne, Samantha J, PA-C, 10 mL at 06/07/23 4540   sodium chloride  flush (NS) 0.9 % injection 10-40 mL, 10-40 mL, Intracatheter, PRN, Rhyne, Samantha J, PA-C   sodium chloride  flush (NS) 0.9 % injection 10-40 mL, 10-40 mL, Intracatheter, PRN, Josiah Nigh, MD   vancomycin  (VANCOREADY) IVPB 1500 mg/300 mL, 1,500 mg, Intravenous, Daily, Sierra Dresser, [facility], Last Rate: 150 mL/hr at 06/07/23 0908, 1,500 mg at 06/07/23 0908     Patients Current Diet:  Diet Order                  Diet regular Room service appropriate? Yes; Fluid consistency: Thin  Diet effective now                       Precautions / Restrictions Precautions Precautions: Fall Precaution/Restrictions Comments: R AKA, wound vac Restrictions Weight Bearing Restrictions Per Provider Order: Yes RLE Weight Bearing Per Provider Order: Non weight bearing    Has the patient had 2 or more falls or a fall with injury in the past year?No   Prior Activity Level Community (5-7x/wk): gets out of the house daily   Prior Functional Level Prior Function Prior Level of Function : Independent/Modified Independent Mobility Comments: Alternates between using SPC and RW. Reports no recent falls. ADLs Comments: Pt typically Independent with ADLs. Pt reports often taking a sponge bath due to the tub/shower bring small. Has family assist with driving   Self Care: Did the patient need help bathing, dressing, using the toilet or eating?  Independent   Indoor Mobility: Did the patient need assistance with walking from room to room (with or without device)? Independent   Stairs: Did the patient need  assistance with internal or external stairs (with or without device)? Independent   Functional Cognition: Did the patient need help planning regular tasks such as shopping or remembering to take medications? Independent   Patient Information Are you of Hispanic, Latino/a,or Spanish origin?: E. Yes, another Hispanic, Latino, or Spanish origin What is your race?: N. Other Pacific Islander Do you need or want an interpreter to communicate with a doctor or health care staff?: 1. Yes   Patient's Response To:  Health Literacy and Transportation Is the patient able to respond to health literacy and transportation needs?: Yes Health Literacy - How often do you need to have someone help you when you read instructions, pamphlets, or other written material from your doctor or pharmacy?: Never In the past 12 months, has lack of transportation kept you from medical appointments or from getting medications?: Yes In the past 12 months, has lack of transportation kept you from meetings, work, or from getting things needed for daily living?: No   Home Assistive Devices / Equipment Home Equipment: Cane - single point, Agricultural consultant (2 wheels)   Prior Device Use: Indicate devices/aids used by the patient prior to current illness, exacerbation or injury? Walker   Current Functional Level Cognition   Orientation Level: Oriented X4    Extremity Assessment (includes  Sensation/Coordination)   Upper Extremity Assessment: Overall WFL for tasks assessed  Lower Extremity Assessment: Defer to PT evaluation RLE Deficits / Details: R AKA, Good quad contraction and ability to SLR RLE Sensation: WNL LLE Deficits / Details: Grossly 4+/5 LLE Sensation: WNL     ADLs   Overall ADL's : Needs assistance/impaired Eating/Feeding: Set up, Sitting Grooming: Wash/dry hands, Sitting, Set up Upper Body Bathing: Set up, Sitting Upper Body Bathing Details (indicate cue type and reason): from EOB Lower Body Bathing: Contact  guard assist, Sitting/lateral leans Lower Body Bathing Details (indicate cue type and reason): lateral leans to wash LB from EOB Upper Body Dressing : Minimal assistance, Sitting Upper Body Dressing Details (indicate cue type and reason): assist needed to manage tele wires Lower Body Dressing: Set up Lower Body Dressing Details (indicate cue type and reason): able to don sock via figure 4 with set- up assist from EOB Toilet Transfer: Contact guard assist, Squat-pivot, BSC/3in1 Toilet Transfer Details (indicate cue type and reason): squat pivot from EOB<>BSC Toileting- Clothing Manipulation and Hygiene: Sitting/lateral lean, Contact guard assist Toileting - Clothing Manipulation Details (indicate cue type and reason): completing anterior pericare via lateral leans with CGA Tub/Shower Transfer Details (indicate cue type and reason): discussed pt having walkin shower at home with lip, pts goal is to use crutches to transfer into shower Functional mobility during ADLs: Contact guard assist (squat pivot) General ADL Comments: ADL participation impacted by impaired balance     Mobility   Overal bed mobility: Needs Assistance Bed Mobility: Supine to Sit, Sit to Supine Supine to sit: Modified independent (Device/Increase time), Used rails, HOB elevated Sit to supine: Modified independent (Device/Increase time), HOB elevated, Used rails General bed mobility comments: extra time, exits R side of bed     Transfers   Overall transfer level: Needs assistance Equipment used: None Transfers: Bed to chair/wheelchair/BSC Sit to Stand: Contact guard assist, Min assist Bed to/from chair/wheelchair/BSC transfer type:: Squat pivot Squat pivot transfers: Contact guard assist Step pivot transfers: Contact guard assist General transfer comment: CGA to pivot from EOB<>BSC, good safety awareness and hand placement noted throughout transfer     Ambulation / Gait / Stairs / Wheelchair Mobility    Ambulation/Gait Ambulation/Gait assistance: Contact guard assist Gait Distance (Feet): 18 Feet (x2) Assistive device: Rolling walker (2 wheels) Gait Pattern/deviations: Decreased step length - left, Decreased stride length, Step-to pattern General Gait Details: Pt ambulates in room with RW & CGA, good ability to use BUE to offload LLE so not hopping on L foot Gait velocity: decreased     Posture / Balance Dynamic Sitting Balance Sitting balance - Comments: sitting EOB ~ 8 mins for bathing with no LOB Balance Overall balance assessment: Needs assistance Sitting-balance support: No upper extremity supported, Feet supported Sitting balance-Leahy Scale: Good Sitting balance - Comments: sitting EOB ~ 8 mins for bathing with no LOB Standing balance support: Bilateral upper extremity supported, During functional activity, Reliant on assistive device for balance Standing balance-Leahy Scale: Poor Standing balance comment: requires BUE support for balance     Special needs/care consideration Wound Vac groin/right, Skin Wound-Dehisced: thigh/anterior, right; Erythema/Redness: leg/right; Surgical incision: thigh/right and leg/right, Contact Precautions and Diabetic management Novolog  0-9 units 3x daily with meals To be on home long term antibiotics and Melody is prepared to learn how to give      Previous Home Environment  Living Arrangements: Children, Other relatives (daughter and grandson)  Lives With: Daughter, Family Available Help at Discharge: Family, Available 24  hours/day Type of Home: Mobile home Home Layout: One level Home Access: Stairs to enter Entrance Stairs-Rails: Right, Left, Can reach both Entrance Stairs-Number of Steps: 5 Bathroom Shower/Tub: Health visitor: Pharmacist, community: Yes Home Care Services: No   Discharge Living Setting Plans for Discharge Living Setting: House (granddaughter Melody's house) Type of Home at Discharge:  House Discharge Home Layout: One level Discharge Home Access: Stairs to enter (a ramp is being built) Science writer: Can reach both Entrance Stairs-Number of Steps: 4 Discharge Bathroom Shower/Tub: Tub/shower unit Discharge Bathroom Toilet: Standard Discharge Bathroom Accessibility: Yes How Accessible: Accessible via walker Does the patient have any problems obtaining your medications?: No   Social/Family/Support Systems Anticipated Caregiver: Melody, granddaughter Anticipated Industrial/product designer Information: [phone] Caregiver Availability: 24/7 Discharge Plan Discussed with Primary Caregiver: Yes Is Caregiver In Agreement with Plan?: Yes Does Caregiver/Family have Issues with Lodging/Transportation while Pt is in Rehab?: No   Goals Patient/Family Goal for Rehab: Supervision: PT/OT Expected length of stay: 8-11 days Pt/Family Agrees to Admission and willing to participate: Yes Program Orientation Provided & Reviewed with Pt/Caregiver Including Roles  & Responsibilities: Yes   Decrease burden of Care through IP rehab admission: NA   Possible need for SNF placement upon discharge:Not anticipated   Patient Condition: This patient's condition remains as documented in the consult dated 06/05/23, in which the Rehabilitation Physician determined and documented that the patient's condition is appropriate for intensive rehabilitative care in an inpatient rehabilitation facility. Will admit to inpatient rehab today.   Preadmission Screen Completed By:  Amiel Kalata, CCC-SLP, 06/07/2023 10:36 AM ______________________________________________________________________   Discussed status with Dr. Alessandra Ancona on 06/07/23 at 0900 and received approval for admission today.   Admission Coordinator:  Amiel Kalata, time 1034/Date 06/07/23        Revision History

## 2023-06-07 NOTE — TOC Transition Note (Signed)
 Transition of Care (TOC) - Discharge Note Sherin Dingwall RN, BSN Transitions of Care Unit 4E- RN Case Manager See Treatment Team for direct phone #   Patient Details  Name: Katherine Archer MRN: [id number] Date of Birth: December 16, 1955  Transition of Care [facility]) CM/SW Contact:  Rox Cope, RN Phone Number: 06/07/2023, 1:15 PM   Clinical Narrative:    Notified that pt has bed for INPT rehab admit today and insurance approval.  Pt stable for transition to [facility] INPT rehab. Pt will need 6 Wks of IV abx end date 7/9- HH needs will be addressed prior to discharge from CIR.    No further TOC interventions needed.    Final next level of care: IP Rehab Facility Barriers to Discharge: Barriers Resolved   Patient Goals and CMS Choice Patient states their goals for this hospitalization and ongoing recovery are:: wants to be able to go home CMS Medicare.gov Compare Post Acute Care list provided to:: Patient Represenative (must comment) Choice offered to / list presented to : Adult Children      Discharge Placement               [facility] INPT rehab        Discharge Plan and Services Additional resources added to the After Visit Summary for     Discharge Planning Services: CM Consult Post Acute Care Choice: Home Health                    HH Arranged: RN [facility] Agency: [facility] Date HH Agency Contacted: 06/01/23 Time HH Agency Contacted: 1305 Representative spoke with at [facility] Agency: Kay Parson RN  Social Drivers of Health (SDOH) Interventions SDOH Screenings   Food Insecurity: No Food Insecurity (06/01/2023)  Housing: Low Risk  (05/30/2023)  Transportation Needs: No Transportation Needs (05/30/2023)  Utilities: Not At Risk (05/30/2023)  Depression (PHQ2-9): Low Risk  (06/29/2020)  Social Connections: Socially Isolated (05/30/2023)  Tobacco Use: Medium Risk (05/31/2023)     Readmission Risk Interventions    06/07/2023    1:15 PM 05/15/2023   11:59 AM 01/28/2022   11:32 AM   Readmission Risk Prevention Plan  Post Dischage Appt  Complete Complete  Medication Screening  Complete Complete  Transportation Screening  Complete Complete  Home Care Screening Complete    Medication Review (RN CM) Complete

## 2023-06-07 NOTE — Discharge Summary (Signed)
 Physician Discharge Summary  Patient ID: Kamaree Berkel MRN: [id number] DOB/AGE: [birth year] 68 y.o.  Admit date: 06/07/2023 Discharge date: 06/16/2023  Discharge Diagnoses:  Principal Problem:   Right above-knee amputee [facility]) Active Problems:   Protein-calorie malnutrition, severe DVT prophylaxis Post op anemia ID/sepsis Hypertension Hyperlipidemia  Discharged Condition: Stable  Significant Diagnostic Studies: US  EKG Site Rite Result Date: 06/14/2023 If Site Rite image not attached, placement could not be confirmed due to current cardiac rhythm.  US  EKG SITE RITE Result Date: 06/01/2023 If Site Rite image not attached, placement could not be confirmed due to current cardiac rhythm.  DG CHEST PORT 1 VIEW Result Date: 05/29/2023 CLINICAL DATA:  Postop from right lower extremity bypass graft. Follow-up pulmonary edema. EXAM: PORTABLE CHEST 1 VIEW COMPARISON:  Prior today FINDINGS: Right jugular center venous catheter again seen with tip overlying the inferior right atrium. Heart size is within normal limits. Diffuse interstitial infiltrates are again seen, consistent with diffuse interstitial edema. No focal consolidation or pleural effusion. IMPRESSION: No significant change in diffuse pulmonary interstitial edema. Electronically Signed   By: Marlyce Sine M.D.   On: 05/29/2023 11:32   CT Angio Aortobifemoral W and/or Wo Contrast Result Date: 05/29/2023 CLINICAL DATA:  Postop infection, right femoral thrombectomy and popliteal bypass, vascular requested runoff. EXAM: CT ANGIOGRAPHY OF ABDOMINAL AORTA WITH ILIOFEMORAL RUNOFF TECHNIQUE: Multidetector CT imaging of the abdomen, pelvis and lower extremities was performed using the standard protocol during bolus administration of intravenous contrast. Multiplanar CT image reconstructions and MIPs were obtained to evaluate the vascular anatomy. RADIATION DOSE REDUCTION: This exam was performed according to the departmental dose-optimization program  which includes automated exposure control, adjustment of the mA and/or kV according to patient size and/or use of iterative reconstruction technique. CONTRAST:  OMNIPAQUE  IOHEXOL  350 MG/ML SOLN COMPARISON:  CT 05/11/2023 FINDINGS: VASCULAR Aorta: Advanced mixed density atherosclerotic plaque throughout the aorta with mild narrowing of the infrarenal abdominal aorta. No aneurysm or dissection. Celiac: Patent without aneurysm or dissection. SMA: Abdominal a noncalcified atherosclerotic plaque causes moderate narrowing of the proximal SMA. Renals: Severe right and moderate left renal artery narrowing. IMA: Patent. RIGHT Lower Extremity Inflow: Moderate stenosis in the right common femoral artery. 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 Outflow: Right axillary to femoral bypass graft is patent. Patent femoral to femoral bypass graft. New femoral-popliteal bypass graft which is patent. Patent popliteal artery. Chronically occluded right profundus femoral artery stent graft. There is peripherally enhancing fluid and gas surrounding the stent graft concerning for infection. The fluid and gas tract from the anastomotic site in the anterior right thigh along the profunda femoral stent graft and along the popliteal artery into the proximal posterior calf musculature. Multiple tracks extend from the fluid collection to the medial skin surface (for example series 5/image 341 and 5/412). Runoff: Patent three vessel runoff to the ankle. LEFT Lower Extremity Inflow: Severe narrowing of the left common iliac artery. Chronic occlusion of the left external iliac artery. Patent femoral-femoral bypass graft. Outflow: Patent occlusion of the mid to distal SFA. Patent popliteal artery. Diminutive left superficial femoral artery. Profunda femoral artery is patent. Runoff: Patent three vessel runoff to the ankle. Veins: No obvious venous abnormality within the limitations of this arterial phase study. Review of the MIP  images confirms the above findings. NON-VASCULAR Lower chest: Cardiomegaly.  No acute abnormality. Hepatobiliary: No acute abnormality. Pancreas: Unremarkable. Spleen: Unremarkable. Adrenals/Urinary Tract: Stable adrenal glands. No urinary calculi or hydronephrosis. Foley catheter and gas in the  distended bladder. Stomach/Bowel: Normal caliber large and small bowel. Colonic diverticulosis without diverticulitis. No bowel wall thickening. Stomach is within normal limits. Right inguinal lymph nodes measuring up to 1.4 cm are likely reactive. Reproductive: Pessary.  No acute abnormality. Other: No free intraperitoneal fluid or air. Musculoskeletal: No acute fracture. IMPRESSION: 1. Peripherally enhancing fluid and gas surrounding the chronically occluded right profunda femoral artery stent graft concerning for infection/abscess. The fluid and gas extend from the anastomotic site in the upper anterior right thigh along the profunda femoral stent graft and along the popliteal artery into the proximal posterior calf musculature. Multiple tracks extend from the fluid collection to the medial skin surface. 2. Patent right axillary to femoral and femoral to femoral bypass grafts. New right femoral-popliteal bypass graft is patent. 3. Patent three vessel runoff to the ankles bilaterally. 4. Aortic Atherosclerosis (ICD10-I70.0). Electronically Signed   By: Rozell Cornet M.D.   On: 05/29/2023 03:17   DG Chest Portable 1 View Result Date: 05/29/2023 CLINICAL DATA:  Central line placement EXAM: PORTABLE CHEST 1 VIEW COMPARISON:  Radiograph 05/28/2023 FINDINGS: Right IJ CVC tip extends through the right atrium with tip near the inferior cavoatrial junction. Recommend retraction. Stable cardiomediastinal silhouette. Aortic atherosclerotic calcification. Diffuse interstitial coarsening. No focal consolidation, pleural effusion, or pneumothorax. No displaced rib fractures. IMPRESSION: Right IJ CVC tip extends through the right  atrium with tip near the inferior cavoatrial junction. Recommend retraction. Diffuse interstitial coarsening suggestive of edema. Electronically Signed   By: Rozell Cornet M.D.   On: 05/29/2023 00:46   DG Chest Port 1 View Result Date: 05/28/2023 CLINICAL DATA:  Sepsis, wound infection EXAM: PORTABLE CHEST 1 VIEW COMPARISON:  05/14/2023 FINDINGS: Single frontal view of the chest demonstrates a stable cardiac silhouette. No airspace disease, effusion, or pneumothorax. No acute bony abnormalities. IMPRESSION: 1. Stable chest, no acute process. Electronically Signed   By: Bobbye Burrow M.D.   On: 05/28/2023 21:42    Labs:  Basic Metabolic Panel: Recent Labs  Lab 06/12/23 0518 06/15/23 1339 06/16/23 1759  NA 137 134*  --   K 4.0 4.1  --   CL 104 97*  --   CO2 26 26  --   GLUCOSE 96 119*  --   BUN 9 14  --   CREATININE 0.62 0.84  --   CALCIUM  9.2 8.8*  --   MG  --   --  2.0    CBC: Recent Labs  Lab 06/12/23 0518 06/15/23 1339  WBC 8.4 7.6  HGB 8.7* 8.7*  HCT 27.2* 27.5*  MCV 95.4 96.5  PLT 392 367    CBG: No results for input(s): GLUCAP in the last 168 hours.   Brief HPI:   Luva Metzger is a 68 y.o. right-handed female with history significant for hypertension hyperlipidemia quit smoking 3 years ago PAD maintained on Eliquis  with multiple revascularization procedures including status post right femoral-popliteal BGD 05/12/2023 per Dr. Charlotte Cookey and discharged on 05/15/2023.  Per chart review lives with daughter and grandson 1 level home 5 steps to entry.  Alternates between using a straight point cane and rolling walker for mobility.  Presented 05/28/2023 with chills nausea as well as wound dehiscence suspect sepsis with WBC 31,000 and underwent reexposure of right common femoral artery popliteal artery with ligation of right axillobifemoral bypass graft excision of right common femoral artery excision of right femoral to above-knee popliteal artery bypass with above-knee popliteal  artery ligation right sided greater saphenous vein saphenectomy as well as  excision of right femoral to below-knee popliteal artery bypass 05/29/2023 per Dr. Rozanna Corner.  Wound VAC was applied to right groin.  Poor healing of vascular site necessitating the need for right AKA 05/31/2023 per Dr. Charlotte Cookey.  [facility] course acute blood loss anemia 8.9.  She was cleared to begin subcutaneous heparin  for DVT prophylaxis.  No plan to resume Eliquis  that she had been on for PAD.  Patient initially placed on daptomycin  for wound coverage plan to transition to doxycycline however with noted elevated CK to 3217 felt to be secondary to daptomycin  with infectious disease consulted antibiotic transition to vancomycin  for now.  Therapy evaluations completed due to patient decreased functional mobility was admitted for a comprehensive rehab program.     Hospital Course: Saiya Crist was admitted to rehab 06/07/2023 for inpatient therapies to consist of PT, ST and OT at least three hours five days a week. Past admission physiatrist, therapy team and rehab RN have worked together to provide customized collaborative inpatient rehab.  Pertaining to patient's right AKA 05/31/2023 after failed revascularization procedure remained stable initial wound VAC to right groin followed by vascular surgery.  She had been cleared for subcutaneous heparin  for DVT prophylaxis remained on low-dose aspirin .  Patient had been on Eliquis  for PAD in the past no current plan to resume.  Pain management use of Neurontin  300 mg twice daily as well as oxycodone  as needed.  Postoperative anemia stable no further bleeding episodes.  ID/sepsis initially on daptomycin  changed to vancomycin  due to elevated CK follow-up infectious disease awaiting long-term plan of care antibiotic.  Blood pressure controlled on present regimen monitor with increased mobility follow-up outpatient.  Hyperlipidemia initially hold of Crestor  due to elevated CK which had improved  moved to 603.  During routine follow-up by vascular surgery for anticipated discharge monitoring of wound VAC site with increased drainage as well as copious fibrinous exudate without tissue closure over ax-fem  stump felt irrigation and debridement was needed and patient underwent I&D of right groin including subcutaneous tissue placement of wound VAC as well as redo of right AKA site 06/16/2023 per Dr. Charlotte Cookey.  Patient was discharged to acute care services for ongoing care.   Blood pressures were monitored on TID basis and remained controlled and monitored      Rehab course: During patient's stay in rehab weekly team conferences were held to monitor patient's progress, set goals and discuss barriers to dis    Media Information   Document Information  Photos    06/15/2023 07:47  Attached To:  Hospital Encounter on 06/07/23  Source Information  Cordie Deters, PA-C  [facility]  Media Information   Document Information  Photos    06/08/2023 07:10  Attached To:  Hospital Encounter on 06/07/23  Source Information  Schuh, McKenzi P, PA-C  [facility]  Document History   charge. At admission, patient required contact-guard assist 18 feet rolling walker contact-guard step pivot transfers  He/She  has had improvement in activity tolerance, balance, postural control as well as ability to compensate for deficits. He/She has had improvement in functional use RUE/LUE  and RLE/LLE as well as improvement in awareness.  Perform squat pivot transfer to bed to wheelchair with the use of armrest wheelchair supervision.  Perform stand pivot transfers wheelchair to car simulated with rolling walker supervision.  Perform squat pivot transfer wheelchair to rehab apartment bed with supervision.  Perform amatory transfer to bedside commode over toilet x 2 with supervision.  Bed mobility throughout sessions during ADLs modified independent.  Ambulates bed to bathroom to sink  with supervision using rolling walker.  Self propels wheelchair supervision.  Modified independent for straight and even pathways.  Full family teaching completed plan discharge to home       Disposition: Discharged to acute care services    Diet: Regular  Special Instructions: No driving smoking or alcohol   Follow-up with PCP on resuming Crestor  as CK improves  Medications at discharge. 1.  Tylenol  as needed 2.  Norvasc  10 mg p.o. daily 3.  Aspirin  81 mg p.o. daily 4.  Colace 100 mg twice daily as needed constipation 5.  Zetia  10 mg p.o. daily 6.  Neurontin  300 mg p.o. twice daily 7.  Hydralazine  25 mg every 8 hours 8.  HCTZ 12.5 mg p.o. daily 9.  Avapro  75 mg p.o. daily 10.  Oxycodone  5 to 10 mg every 6 hours as needed pain 11.  MiraLAX  daily as needed constipation 12.  Fosamax 70 mg weekly 13.  OxyContin  10 mg nightly 14.  Zinc  sulfate 220 mg daily 15.  Intravenous vancomycin  1250 mg daily through 07/12/2023 and stop 16.  Vitamin C  250 mg twice daily 17.  Ferrous sulfate  325 mg daily with breakfast   30-35 minutes were spent completing discharge summary and discharge planning  Discharge Instructions     [facility] pharmacist to adjust dose for Vancomycin , Aminoglycosides and other anti-infective therapies as requested by physician.   Complete by: As directed    [facility] to provide Cath Flo 2mg    Complete by: As directed    Administer for PICC line occlusion and as ordered by physician for other access device issues.   Ambulatory referral to Physical Medicine Rehab   Complete by: As directed    Moderate complexity follow-up 1 to 2 weeks right AKA   Anaphylaxis Kit: Provided to treat any anaphylactic reaction to the medication being provided to the patient if First Dose or when requested by physician   Complete by: As directed    Epinephrine 1mg /ml vial / amp: Administer 0.3mg  (0.81ml) subcutaneously once for moderate to severe anaphylaxis,  nurse to call physician and pharmacy when reaction occurs and call 911 if needed for immediate care   Diphenhydramine 50mg /ml IV vial: Administer 25-50mg  IV/IM PRN for first dose reaction, rash, itching, mild reaction, nurse to call physician and pharmacy when reaction occurs   Sodium Chloride  0.9% NS 500ml IV: Administer if needed for hypovolemic blood pressure drop or as ordered by physician after call to physician with anaphylactic reaction   Change dressing on IV access line weekly and PRN   Complete by: As directed    Flush IV access with Sodium Chloride  0.9% and Heparin  10 units/ml or 100 units/ml   Complete by: As directed    Home infusion instructions - [facility]   Complete by: As directed    Instructions: Flush IV access with Sodium Chloride  0.9% and Heparin  10units/ml or 100units/ml   Change dressing on IV access line: Weekly and PRN   Instructions Cath Flo 2mg : Administer for PICC Line occlusion and as ordered by physician for other access device   [facility] pharmacist to adjust dose for: Vancomycin , Aminoglycosides and other anti-infective therapies as requested by physician   Method of administration may be changed at the discretion of home infusion pharmacist based upon assessment of the patient and/or caregiver's ability to self-administer the medication ordered   Complete by: As directed  Outpatient Parenteral Antibiotic Therapy Information Antibiotic: Vancomycin  IVPB; Indications for use: Bacteremia/Right groin graft infection; End Date: 07/12/2023   Complete by: As directed    Antibiotic: Vancomycin  IVPB   Indications for use: Bacteremia/Right groin graft infection   End Date: 07/12/2023        Follow-up Information     Margherita Shell, MD Follow up.   Specialties: Vascular Surgery, Cardiology Why: Call for appointment Contact information: [address] [place] [address] [phone]         Liane Redman, MD Follow up.    Specialty: Infectious Diseases Why: Call for appointment Contact information: [address] [place] [address] [phone]         Lylia Sand, MD Follow up.   Specialty: Physical Medicine and Rehabilitation Why: Office to call for appointment Contact information: [address] [place] [address] [phone]                 Signed: Sterling Eisenmenger 06/18/2023, 3:15 PM

## 2023-06-07 NOTE — Discharge Instructions (Addendum)
 Inpatient Rehab Discharge Instructions  Katherine Archer Discharge date and time: No discharge date for patient encounter.   Activities/Precautions/ Functional Status: Activity: As tolerated Diet: Regular Wound Care: Routine skin checks Functional status:  ___ No restrictions     ___ Walk up steps independently ___ 24/7 supervision/assistance   ___ Walk up steps with assistance ___ Intermittent supervision/assistance  ___ Bathe/dress independently ___ Walk with walker     _x__ Bathe/dress with assistance ___ Walk Independently    ___ Shower independently ___ Walk with assistance    ___ Shower with assistance ___ No alcohol      ___ Return to work/school ________  Special Instructions: No driving smoking or alcohol   Continue intravenous vancomycin  1250 mg daily through 07/12/2023 and stop  Follow-up with PCP on resuming Crestor  as CK improves   COMMUNITY REFERRALS UPON DISCHARGE:    Home Health:   PT     OT      RN                Agency:[facility] [phone]    Medical Equipment/Items Ordered: WHEELCHAIR    RECEIVED A BEDSIDE COMMODE IN 2021 CAN NOT GET ANOTHER ONE UNTIL 2026                                          [facility]                                                 Agency/Supplier: [facility]   [phone]                                                               [facility] REFERRAL PLACED     My questions have been answered and I understand these instructions. I will adhere to these goals and the provided educational materials after my discharge from the hospital.  Patient/Caregiver Signature _______________________________ Date __________  Clinician Signature _______________________________________ Date __________  Please bring this form and your medication list with you to all your follow-up doctor's appointments.

## 2023-06-07 NOTE — Discharge Summary (Signed)
 Physician Discharge Summary  Katherine Archer UJW:[id number] DOB: 03-05-1955 DOA: 05/28/2023  PCP: Barbar Levine, MD  Admit date: 05/28/2023  Discharge date: 06/07/2023  Admitted From: Home  Disposition:  Acute inpatient rehab  Recommendations for Outpatient Follow-up:  Follow up with PCP in 1-2 weeks. Please obtain BMP/CBC in one week. Advised to follow-up with vascular surgery as scheduled. Patient being discharged on Vancomycin  until 07/12/2023. At that time, ID recommend transition to doxycycline+penicillin  for 2 months after that doxycyline suppression indefinitely  Home Health: None Equipment/Devices:Wound vac  Discharge Condition: Stable CODE STATUS:DNR Diet recommendation: Heart Healthy  Brief [facility] Course: This 68 y.o. F with HTN, HLD, PVD with hx. axillo-fem bypass in 2021, fem-pop bypass Jan 2024 and recent admission earlier this month for critical limb ischemia requiring repeat fem-pop bypass with PTFE who presented with severe pain and swelling of operative leg, purulent drainage, and Hgb 4.4 g/dL.  Admitted to ICU with septic shock on pressors, blood transfusion.  He underwent excision of her previous femoral grafts on 5/26 by Dr. Rosalva Comber.  Underwent AKA and wound flap placement on 5/28 by Dr. Charlotte Cookey. Transferred Out of ICU on 5/29.  Cultures growing group A streptococcus and MRSA graft infection.  ID recommended lifelong suppression with doxycycline.  Repeat blood cultures no growth and transitioned to daptomycin .  ID recommended to continue vancomycin  until July9/25.  At that time ID recommended transition to doxycycline plus penicillin  for 2 months after that doxycycline suppression indefinitely.  Patient is being discharged to CIR.  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  Intraoperative cultures from 5/26 positive for MRSA. She has an old synthetic ax-fem graft still in place in her body, this is ligated, but ID recommend lifelong suppression. Repeat blood cultures 5/27 no growth and transitioned to Daptomycin .  Subsequently noted to have rising CK so transitioned back to Vanc. - Transitioned to vancomycin  - Repeat CK on Friday - Consult ID, appreciate cares   -  ID recommended for Vancomycin  on discharge.  EOT 7/9 - At that time, ID recommend transition to doxycycline+penicillin  for 2 months after that doxycyline suppression indefinitely      PAD (peripheral artery disease) (HCC) S/p Above knee amputation : Eliquis  stopped this admission.  This was indicated for critical limb ischemia, but status post amputation is no longer required.  There is some skin changes to the stump 6/1, concern for ischemic change, these remain stable, not progressing.   Vascular surgery will follow at CIR   - Continue new aspirin . - Continue Zetia . - Hold Crestor  given rising CK - Repeat CK on Friday and likely resume Crestor      Elevated CK: I see chart history of "statin myopathy" in problem list but no description of it in notes and she has been on Crestor  since 2020 - Repeat CK Friday - Hold statin for now   Normocytic anemia Hgb 4.4 on admission, etiology probably unclear.  Transfused 3u PRBCs prior to surgery Hemoglobin trending slightly down, no clinical bleeding observed.   Hypertension Hyperlipidemia BP improving -  Continue new amlodipine , hydrochlorothiazide   - Continue hydralazine , irbesartan .          Discharge Instructions  Discharge  Instructions     [facility] pharmacist to adjust dose for Vancomycin , Aminoglycosides and other anti-infective therapies as requested by physician.   Complete by: As directed    [facility] pharmacist to adjust dose for Vancomycin , Aminoglycosides and other anti-infective therapies as requested by physician.   Complete by: As directed    [facility] to provide Cath Flo 2mg    Complete by: As directed    Administer for PICC line occlusion and as ordered by physician for other access device issues.   [facility] to provide Cath Flo 2mg    Complete by: As directed    Administer for PICC line occlusion and as ordered by physician for other access device issues.   Anaphylaxis Kit: Provided to treat any anaphylactic reaction to the medication being provided to the patient if First Dose or when requested by physician   Complete by: As directed    Epinephrine 1mg /ml vial / amp: Administer 0.3mg  (0.19ml) subcutaneously once for moderate to severe anaphylaxis, nurse to call physician and pharmacy when reaction occurs and call 911 if needed for immediate care   Diphenhydramine 50mg /ml IV vial: Administer 25-50mg  IV/IM PRN for first dose reaction, rash, itching, mild reaction, nurse to call physician and pharmacy when reaction occurs   Sodium Chloride  0.9% NS 500ml IV: Administer if needed for hypovolemic blood pressure drop or as ordered by physician after call to physician with anaphylactic reaction   Anaphylaxis Kit: Provided to treat any anaphylactic reaction to the medication being provided to the patient if First Dose or when requested by physician   Complete by: As directed    Epinephrine 1mg /ml vial / amp: Administer 0.3mg  (0.37ml) subcutaneously once for moderate to severe anaphylaxis, nurse to call physician and pharmacy when reaction occurs and call 911 if needed for immediate care   Diphenhydramine 50mg /ml IV vial: Administer 25-50mg  IV/IM PRN for first  dose reaction, rash, itching, mild reaction, nurse to call physician and pharmacy when reaction occurs   Sodium Chloride  0.9% NS 500ml IV: Administer if needed for hypovolemic blood pressure drop or as ordered by physician after call to physician with anaphylactic reaction   Call MD for:  difficulty breathing, headache or visual disturbances   Complete by: As directed    Call MD for:  persistant dizziness or light-headedness   Complete by: As directed    Call MD for:  persistant nausea and vomiting   Complete by: As directed    Change dressing on IV access line weekly and PRN   Complete by: As directed    Change dressing on IV access line weekly and PRN   Complete by: As directed    Diet - low sodium heart healthy   Complete by: As directed    Diet Carb Modified   Complete by: As directed    Discharge instructions   Complete by: As directed    Advised to follow-up with primary care physician in 1 week. Advised to follow-up with vascular surgery as scheduled. Patient being discharged on daptomycin  until 07/12/2023. At that time, ID recommend transition to doxycycline+penicillin  for 2 months after that doxycyline suppression indefinitely   Discharge wound care:   Complete by: As directed    Follow-up wound care and inpatient rehab   Flush IV access with Sodium Chloride  0.9% and Heparin  10 units/ml or 100 units/ml  Complete by: As directed    Flush IV access with Sodium Chloride  0.9% and Heparin  10 units/ml or 100 units/ml   Complete by: As directed    Home infusion instructions - [facility]   Complete by: As directed    Instructions: Flush IV access with Sodium Chloride  0.9% and Heparin  10units/ml or 100units/ml   Change dressing on IV access line: Weekly and PRN   Instructions Cath Flo 2mg : Administer for PICC Line occlusion and as ordered by physician for other access device   [facility] pharmacist to adjust dose for: Vancomycin , Aminoglycosides and other  anti-infective therapies as requested by physician   Home infusion instructions - [facility]   Complete by: As directed    Instructions: Flush IV access with Sodium Chloride  0.9% and Heparin  10units/ml or 100units/ml   Change dressing on IV access line: Weekly and PRN   Instructions Cath Flo 2mg : Administer for PICC Line occlusion and as ordered by physician for other access device   [facility] pharmacist to adjust dose for: Vancomycin , Aminoglycosides and other anti-infective therapies as requested by physician   Increase activity slowly   Complete by: As directed    Method of administration may be changed at the discretion of home infusion pharmacist based upon assessment of the patient and/or caregiver's ability to self-administer the medication ordered   Complete by: As directed    Method of administration may be changed at the discretion of home infusion pharmacist based upon assessment of the patient and/or caregiver's ability to self-administer the medication ordered   Complete by: As directed       Allergies as of 06/07/2023   No Active Allergies      Medication List     STOP taking these medications    acetaminophen  325 MG tablet Commonly known as: TYLENOL    Eliquis  5 MG Tabs tablet Generic drug: apixaban    oxyCODONE -acetaminophen  5-325 MG tablet Commonly known as: PERCOCET/ROXICET       TAKE these medications    alendronate 70 MG tablet Commonly known as: FOSAMAX Take 70 mg by mouth once a week.   amLODipine  10 MG tablet Commonly known as: NORVASC  Tome 1 tableta (10 mg en total) por va oral diariamente. (Take 1 tablet (10 mg total) by mouth daily.) Start taking on: June 08, 2023   aspirin  EC 81 MG tablet Take 1 tablet (81 mg total) by mouth daily. Swallow whole. Start taking on: June 08, 2023   ezetimibe  10 MG tablet Commonly known as: ZETIA  Take 10 mg by mouth daily.   gabapentin  300 MG capsule Commonly known as:  NEURONTIN  Take 1 capsule (300 mg total) by mouth 2 (two) times daily.   hydrALAZINE  25 MG tablet Commonly known as: APRESOLINE  Take 25 mg by mouth 3 (three) times daily as needed (for BP >180/100).   hydrochlorothiazide  12.5 MG tablet Commonly known as: HYDRODIURIL  Take 1 tablet (12.5 mg total) by mouth daily. Start taking on: June 08, 2023   polyethylene glycol powder 17 GM/SCOOP powder Commonly known as: GLYCOLAX /MIRALAX  Take 1 capful (17 g) by mouth daily as needed for mild constipation.   rosuvastatin  40 MG tablet Commonly known as: CRESTOR  Take 40 mg by mouth daily.   Tiadylt  ER 180 MG 24 hr capsule Generic drug: diltiazem  Take 180 mg by mouth daily.   valsartan 160 MG tablet Commonly known as: DIOVAN Take 160 mg by mouth daily.   vancomycin  IVPB Inject 1,500 mg into the vein daily. Indication:  Bacteremia/R-groin graft infection First Dose: Yes Last Day of Therapy:  07/12/23 Labs - "Sunday/Monday:  CBC/D, BMP, and vancomycin trough. Labs - Thursday:  BMP and vancomycin trough Labs - Once weekly: ESR and CRP Method of administration:Elastomeric Method of administration may be changed at the discretion of the patient and/or caregiver's ability to self-administer the medication ordered.               Discharge Care Instructions  (From admission, onward)           Start     Ordered   06/07/23 0000  Change dressing on IV access line weekly and PRN  (Home infusion instructions - [facility] )        06/07/23 1227   06/07/23 0000  Discharge wound care:       Comments: Follow-up wound care and inpatient rehab   06/07/23 1229   06/07/23 0000  Change dressing on IV access line weekly and PRN  (Home infusion instructions - [facility] )        06" /04/25 1324            Follow-up Information     Barbar Levine, MD Follow up in 1 week(s).   Specialty: Family Medicine Contact information: [address] [place]  [address] [phone]         Young Hensen, MD Follow up in 1 week(s).   Specialty: Vascular Surgery Contact information: [address] [place] [address] [phone]                No Active Allergies  Consultations: Vascular surgery   Procedures/Studies: US  EKG SITE RITE Result Date: 06/01/2023 If Site Rite image not attached, placement could not be confirmed due to current cardiac rhythm.  DG CHEST PORT 1 VIEW Result Date: 05/29/2023 CLINICAL DATA:  Postop from right lower extremity bypass graft. Follow-up pulmonary edema. EXAM: PORTABLE CHEST 1 VIEW COMPARISON:  Prior today FINDINGS: Right jugular center venous catheter again seen with tip overlying the inferior right atrium. Heart size is within normal limits. Diffuse interstitial infiltrates are again seen, consistent with diffuse interstitial edema. No focal consolidation or pleural effusion. IMPRESSION: No significant change in diffuse pulmonary interstitial edema. Electronically Signed   By: Marlyce Sine M.D.   On: 05/29/2023 11:32   CT Angio Aortobifemoral W and/or Wo Contrast Result Date: 05/29/2023 CLINICAL DATA:  Postop infection, right femoral thrombectomy and popliteal bypass, vascular requested runoff. EXAM: CT ANGIOGRAPHY OF ABDOMINAL AORTA WITH ILIOFEMORAL RUNOFF TECHNIQUE: Multidetector CT imaging of the abdomen, pelvis and lower extremities was performed using the standard protocol during bolus administration of intravenous contrast. Multiplanar CT image reconstructions and MIPs were obtained to evaluate the vascular anatomy. RADIATION DOSE REDUCTION: This exam was performed according to the departmental dose-optimization program which includes automated exposure control, adjustment of the mA and/or kV according to patient size and/or use of iterative reconstruction technique. CONTRAST:  OMNIPAQUE  IOHEXOL  350 MG/ML SOLN COMPARISON:  CT 05/11/2023 FINDINGS: VASCULAR Aorta: Advanced mixed  density atherosclerotic plaque throughout the aorta with mild narrowing of the infrarenal abdominal aorta. No aneurysm or dissection. Celiac: Patent without aneurysm or dissection. SMA: Abdominal a noncalcified atherosclerotic plaque causes moderate narrowing of the proximal SMA. Renals: Severe right and moderate left renal artery narrowing. IMA: Patent. RIGHT Lower Extremity Inflow: Moderate stenosis in the right common femoral artery. 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 Outflow: Right axillary to femoral bypass graft is patent. Patent  femoral to femoral bypass graft. New femoral-popliteal bypass graft which is patent. Patent popliteal artery. Chronically occluded right profundus femoral artery stent graft. There is peripherally enhancing fluid and gas surrounding the stent graft concerning for infection. The fluid and gas tract from the anastomotic site in the anterior right thigh along the profunda femoral stent graft and along the popliteal artery into the proximal posterior calf musculature. Multiple tracks extend from the fluid collection to the medial skin surface (for example series 5/image 341 and 5/412). Runoff: Patent three vessel runoff to the ankle. LEFT Lower Extremity Inflow: Severe narrowing of the left common iliac artery. Chronic occlusion of the left external iliac artery. Patent femoral-femoral bypass graft. Outflow: Patent occlusion of the mid to distal SFA. Patent popliteal artery. Diminutive left superficial femoral artery. Profunda femoral artery is patent. Runoff: Patent three vessel runoff to the ankle. Veins: No obvious venous abnormality within the limitations of this arterial phase study. Review of the MIP images confirms the above findings. NON-VASCULAR Lower chest: Cardiomegaly.  No acute abnormality. Hepatobiliary: No acute abnormality. Pancreas: Unremarkable. Spleen: Unremarkable. Adrenals/Urinary Tract: Stable adrenal glands. No urinary calculi or hydronephrosis.  Foley catheter and gas in the distended bladder. Stomach/Bowel: Normal caliber large and small bowel. Colonic diverticulosis without diverticulitis. No bowel wall thickening. Stomach is within normal limits. Right inguinal lymph nodes measuring up to 1.4 cm are likely reactive. Reproductive: Pessary.  No acute abnormality. Other: No free intraperitoneal fluid or air. Musculoskeletal: No acute fracture. IMPRESSION: 1. Peripherally enhancing fluid and gas surrounding the chronically occluded right profunda femoral artery stent graft concerning for infection/abscess. The fluid and gas extend from the anastomotic site in the upper anterior right thigh along the profunda femoral stent graft and along the popliteal artery into the proximal posterior calf musculature. Multiple tracks extend from the fluid collection to the medial skin surface. 2. Patent right axillary to femoral and femoral to femoral bypass grafts. New right femoral-popliteal bypass graft is patent. 3. Patent three vessel runoff to the ankles bilaterally. 4. Aortic Atherosclerosis (ICD10-I70.0). Electronically Signed   By: Rozell Cornet M.D.   On: 05/29/2023 03:17   DG Chest Portable 1 View Result Date: 05/29/2023 CLINICAL DATA:  Central line placement EXAM: PORTABLE CHEST 1 VIEW COMPARISON:  Radiograph 05/28/2023 FINDINGS: Right IJ CVC tip extends through the right atrium with tip near the inferior cavoatrial junction. Recommend retraction. Stable cardiomediastinal silhouette. Aortic atherosclerotic calcification. Diffuse interstitial coarsening. No focal consolidation, pleural effusion, or pneumothorax. No displaced rib fractures. IMPRESSION: Right IJ CVC tip extends through the right atrium with tip near the inferior cavoatrial junction. Recommend retraction. Diffuse interstitial coarsening suggestive of edema. Electronically Signed   By: Rozell Cornet M.D.   On: 05/29/2023 00:46   DG Chest Port 1 View Result Date: 05/28/2023 CLINICAL DATA:   Sepsis, wound infection EXAM: PORTABLE CHEST 1 VIEW COMPARISON:  05/14/2023 FINDINGS: Single frontal view of the chest demonstrates a stable cardiac silhouette. No airspace disease, effusion, or pneumothorax. No acute bony abnormalities. IMPRESSION: 1. Stable chest, no acute process. Electronically Signed   By: Bobbye Burrow M.D.   On: 05/28/2023 21:42   DG CHEST PORT 1 VIEW Result Date: 05/14/2023 CLINICAL DATA:  [id number] Fever [id number] EXAM: PORTABLE CHEST 1 VIEW COMPARISON:  November 29, 2021 FINDINGS: The cardiomediastinal silhouette is unchanged in contour.Atherosclerotic calcifications. No pleural effusion. No pneumothorax. No acute pleuroparenchymal abnormality. Chronic superior subluxation of the RIGHT clavicle in relation to the scapula. IMPRESSION: No acute cardiopulmonary abnormality. Electronically Signed  By: Clancy Crimes M.D.   On: 05/14/2023 11:33   CT ANGIO AO+BIFEM W & OR WO CONTRAST Result Date: 05/12/2023 CLINICAL DATA:  Claudication or leg ischemia. Right leg is unable to bear weight and cold to touch. History of fem-pop bypass on the right leg. EXAM: CT ANGIOGRAPHY OF ABDOMINAL AORTA WITH ILIOFEMORAL RUNOFF TECHNIQUE: Multidetector CT imaging of the abdomen, pelvis and lower extremities was performed using the standard protocol during bolus administration of intravenous contrast. Multiplanar CT image reconstructions and MIPs were obtained to evaluate the vascular anatomy. RADIATION DOSE REDUCTION: This exam was performed according to the departmental dose-optimization program which includes automated exposure control, adjustment of the mA and/or kV according to patient size and/or use of iterative reconstruction technique. CONTRAST:  OMNIPAQUE  IOHEXOL  350 MG/ML SOLN COMPARISON:  CT 11/29/2021 FINDINGS: VASCULAR Aorta: Advanced diffuse mixed density atherosclerotic plaque in the abdominal aorta without aneurysm or dissection. No hemodynamically significant stenosis. Celiac: No  aneurysm, dissection, or hemodynamically significant stenosis. SMA: Moderate narrowing in the proximal SMA secondary to predominantly noncalcified atherosclerotic plaque. No aneurysm or dissection. Renals: Calcified plaque causes moderate narrowing in both renal arteries. No aneurysm or dissection. IMA: Patent. RIGHT Lower Extremity Inflow: Moderate stenosis in the right common iliac artery. 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 Right axillary femoral bypass graft. The femoral to femoral bypass graft is patent. Outflow: Chronic occlusion of the right femoral graft which was supplying a right profunda femoral stent graft which is also nonopacified. There is collateral reconstitution of the distal superficial femoral artery superior to the anastomosis with the profunda femoral stent graft. There is occlusion of the superficial femoral and popliteal arteries distal to the anastomosis with the stent graft. Runoff: Opacification of the proximal posterior tibial artery. No vessels are opacified at the level of the ankle. LEFT Lower Extremity Inflow: Severe narrowing of the left common iliac artery. The left external iliac artery is chronically occluded. Outflow: The femoral-femoral portion of the axillary femoral bypass graft is patent and supplies the diminutive left superficial femoral artery and profunda femoral artery. There is occlusion of the mid to distal SFA. There is reconstitution of the popliteal artery. Runoff: Patent three vessel runoff to the ankle. Veins: No obvious venous abnormality within the limitations of this arterial phase study. Review of the MIP images confirms the above findings. NON-VASCULAR Lower chest: No acute abnormality. Hepatobiliary: No focal liver abnormality is seen. No gallstones, gallbladder wall thickening, or biliary dilatation. Pancreas: Unremarkable. No pancreatic ductal dilatation or surrounding inflammatory changes. Spleen: Normal in size without focal abnormality.  Adrenals/Urinary Tract: Adrenal glands are unremarkable. Kidneys are normal, without renal calculi, focal lesion, or hydronephrosis. Bladder is unremarkable. Stomach/Bowel: No bowel obstruction or bowel wall thickening. Colonic diverticulosis without diverticulitis. Lymphatic: No lymphadenopathy. Reproductive: Pessary.  Uterus and ovaries are unremarkable. Other: No free intraperitoneal fluid or air. Musculoskeletal: No acute fracture. IMPRESSION: Extensive atherosclerotic disease of the aorta, mesenteric, renal, and lower extremity arteries as described. Changes compared to 11/29/2021 include placement of a right profunda femoral artery stent graft which is occluded and occlusion of the distal superficial femoral artery/popliteal artery. Minimal opacification of the proximal right posterior tibial artery. The right runoff vessels are otherwise nonopacified. Critical Value/emergent results were called by telephone at the time of interpretation on 05/12/2023 at 12:05 am to provider Dr. Leida Puna, who verbally acknowledged these results. Electronically Signed   By: Rozell Cornet M.D.   On: 05/12/2023 00:07    Subjective: Patient was seen and  examined at bedside.  Overnight events noted. Patient reports feeling better.  Patient reports she wants to be discharged to acute rehab.  Discharge Exam: Vitals:   06/07/23 0739 06/07/23 1435  BP: (!) 153/47 (!) 127/49  Pulse: 67 70  Resp: 16 17  Temp: 97.7 F (36.5 C)   SpO2: 100% 97%   Vitals:   06/07/23 0354 06/07/23 0549 06/07/23 0739 06/07/23 1435  BP: (!) 138/49 (!) 134/56 (!) 153/47 (!) 127/49  Pulse: 70 65 67 70  Resp: 16 17 16 17   Temp: (!) 97.5 F (36.4 C)  97.7 F (36.5 C)   TempSrc: Oral  Oral   SpO2: 100% 96% 100% 97%  Weight: 65.9 kg     Height:        General: Pt is alert, awake, not in acute distress Cardiovascular: RRR, S1/S2 +, no rubs, no gallops Respiratory: CTA bilaterally, no wheezing, no rhonchi Abdominal: Soft, NT, ND,  bowel sounds + Extremities: no edema, no cyanosis    The results of significant diagnostics from this hospitalization (including imaging, microbiology, ancillary and laboratory) are listed below for reference.     Microbiology: Recent Results (from the past 240 hours)  Resp panel by RT-PCR (RSV, Flu A&B, Covid) Anterior Nasal Swab     Status: None   Collection Time: 05/28/23  9:27 PM   Specimen: Anterior Nasal Swab  Result Value Ref Range Status   SARS Coronavirus 2 by RT PCR NEGATIVE NEGATIVE Final   Influenza A by PCR NEGATIVE NEGATIVE Final   Influenza B by PCR NEGATIVE NEGATIVE Final    Comment: (NOTE) The Xpert Xpress SARS-CoV-2/FLU/RSV plus assay is intended as an aid in the diagnosis of influenza from Nasopharyngeal swab specimens and should not be used as a sole basis for treatment. Nasal washings and aspirates are unacceptable for Xpert Xpress SARS-CoV-2/FLU/RSV testing.  Fact Sheet for Patients: BloggerCourse.com  Fact Sheet for Healthcare Providers: SeriousBroker.it  This test is not yet approved or cleared by the [place]  FDA and has been authorized for detection and/or diagnosis of SARS-CoV-2 by FDA under an Emergency Use Authorization (EUA). This EUA will remain in effect (meaning this test can be used) for the duration of the COVID-19 declaration under Section 564(b)(1) of the Act, 21 U.S.C. section 360bbb-3(b)(1), unless the authorization is terminated or revoked.     Resp Syncytial Virus by PCR NEGATIVE NEGATIVE Final    Comment: (NOTE) Fact Sheet for Patients: BloggerCourse.com  Fact Sheet for Healthcare Providers: SeriousBroker.it  This test is not yet approved or cleared by the [place]  FDA and has been authorized for detection and/or diagnosis of SARS-CoV-2 by FDA under an Emergency Use Authorization (EUA). This EUA will remain in effect  (meaning this test can be used) for the duration of the COVID-19 declaration under Section 564(b)(1) of the Act, 21 U.S.C. section 360bbb-3(b)(1), unless the authorization is terminated or revoked.  Performed at [facility], [address], [place], [place] [address]   Blood Culture (routine x 2)     Status: Abnormal   Collection Time: 05/28/23  9:27 PM   Specimen: BLOOD RIGHT ARM  Result Value Ref Range Status   Specimen Description BLOOD RIGHT ARM  Final   Special Requests   Final    BOTTLES DRAWN AEROBIC ONLY Blood Culture results may not be optimal due to an inadequate volume of blood received in culture bottles   Culture  Setup Time   Final    GRAM POSITIVE COCCI  IN CHAINS BOTTLES DRAWN AEROBIC ONLY CRITICAL RESULT CALLED TO, READ BACK BY AND VERIFIED WITH: PHARMD ASerena Dana [phone] @ 2045 FH     Culture (A)  Final    GROUP A STREP (S.PYOGENES) ISOLATED HEALTH DEPARTMENT NOTIFIED Performed at [facility], [address], [place], [place] [address]    Report Status 05/31/2023 FINAL  Final   Organism ID, Bacteria GROUP A STREP (S.PYOGENES) ISOLATED  Final      Susceptibility   Group a strep (s.pyogenes) isolated - MIC*    PENICILLIN  <=0.06 SENSITIVE Sensitive     CEFTRIAXONE  <=0.12 SENSITIVE Sensitive     ERYTHROMYCIN <=0.12 SENSITIVE Sensitive     LEVOFLOXACIN 1 SENSITIVE Sensitive     VANCOMYCIN  0.5 SENSITIVE Sensitive     * GROUP A STREP (S.PYOGENES) ISOLATED  Blood Culture ID Panel (Reflexed)     Status: Abnormal   Collection Time: 05/28/23  9:27 PM  Result Value Ref Range Status   Enterococcus faecalis NOT DETECTED NOT DETECTED Final   Enterococcus Faecium NOT DETECTED NOT DETECTED Final   Listeria monocytogenes NOT DETECTED NOT DETECTED Final   Staphylococcus species NOT DETECTED NOT DETECTED Final   Staphylococcus aureus (BCID) NOT DETECTED NOT DETECTED Final   Staphylococcus epidermidis NOT DETECTED NOT DETECTED Final   Staphylococcus lugdunensis NOT  DETECTED NOT DETECTED Final   Streptococcus species DETECTED (A) NOT DETECTED Final    Comment: CRITICAL RESULT CALLED TO, READ BACK BY AND VERIFIED WITH: PHARMD ASerena Dana [phone] @ 2045 FH     Streptococcus agalactiae NOT DETECTED NOT DETECTED Final   Streptococcus pneumoniae NOT DETECTED NOT DETECTED Final   Streptococcus pyogenes DETECTED (A) NOT DETECTED Final    Comment: CRITICAL RESULT CALLED TO, READ BACK BY AND VERIFIED WITH: PHARMD ASerena Dana [id number] @ 2045 FH     A.calcoaceticus-baumannii NOT DETECTED NOT DETECTED Final   Bacteroides fragilis NOT DETECTED NOT DETECTED Final   Enterobacterales NOT DETECTED NOT DETECTED Final   Enterobacter cloacae complex NOT DETECTED NOT DETECTED Final   Escherichia coli NOT DETECTED NOT DETECTED Final   Klebsiella aerogenes NOT DETECTED NOT DETECTED Final   Klebsiella oxytoca NOT DETECTED NOT DETECTED Final   Klebsiella pneumoniae NOT DETECTED NOT DETECTED Final   Proteus species NOT DETECTED NOT DETECTED Final   Salmonella species NOT DETECTED NOT DETECTED Final   Serratia marcescens NOT DETECTED NOT DETECTED Final   Haemophilus influenzae NOT DETECTED NOT DETECTED Final   Neisseria meningitidis NOT DETECTED NOT DETECTED Final   Pseudomonas aeruginosa NOT DETECTED NOT DETECTED Final   Stenotrophomonas maltophilia NOT DETECTED NOT DETECTED Final   Candida albicans NOT DETECTED NOT DETECTED Final   Candida auris NOT DETECTED NOT DETECTED Final   Candida glabrata NOT DETECTED NOT DETECTED Final   Candida krusei NOT DETECTED NOT DETECTED Final   Candida parapsilosis NOT DETECTED NOT DETECTED Final   Candida tropicalis NOT DETECTED NOT DETECTED Final   Cryptococcus neoformans/gattii NOT DETECTED NOT DETECTED Final    Comment: Performed at [facility], [address] [place], [place] [address]  Blood Culture (routine x 2)     Status: None   Collection Time: 05/28/23 10:46 PM   Specimen: BLOOD  Result Value Ref Range Status   Specimen  Description BLOOD BLOOD LEFT ARM  Final   Special Requests   Final    AEROBIC BOTTLE ONLY Blood Culture results may not be optimal due to an inadequate volume of blood received in culture bottles  Culture   Final    NO GROWTH 5 DAYS Performed at [facility], [address], [place], [place] [address]    Report Status 06/02/2023 FINAL  Final  Surgical PCR screen     Status: None   Collection Time: 05/29/23  2:55 AM   Specimen: Nasal Mucosa; Nasal Swab  Result Value Ref Range Status   MRSA, PCR NEGATIVE NEGATIVE Final   Staphylococcus aureus NEGATIVE NEGATIVE Final    Comment: (NOTE) The Xpert SA Assay (FDA approved for NASAL specimens in patients 77 years of age and older), is one component of a comprehensive surveillance program. It is not intended to diagnose infection nor to guide or monitor treatment. Performed at [facility], [address], [place], [place] [address]   Aerobic/Anaerobic Culture w Gram Stain (surgical/deep wound)     Status: None (Preliminary result)   Collection Time: 05/29/23  9:11 AM   Specimen: Groin, Right; Abscess  Result Value Ref Range Status   Specimen Description ABSCESS  Final   Special Requests RIGHT GROIN  Final   Gram Stain   Final    WBC PRESENT, PREDOMINANTLY MONONUCLEAR RARE GRAM POSITIVE COCCI IN PAIRS    Culture   Final    RARE METHICILLIN RESISTANT STAPHYLOCOCCUS AUREUS MODERATE GROUP A STREP (S.PYOGENES) ISOLATED Beta hemolytic streptococci are predictably susceptible to penicillin  and other beta lactams. Susceptibility testing not routinely performed. NO ANAEROBES ISOLATED Sent to [facility] for further susceptibility testing. Performed at [facility], [address] [place], [place] [address]    Report Status PENDING  Incomplete   Organism ID, Bacteria METHICILLIN RESISTANT STAPHYLOCOCCUS AUREUS  Final      Susceptibility   Methicillin resistant staphylococcus aureus - MIC*    CIPROFLOXACIN >=8 RESISTANT  Resistant     ERYTHROMYCIN <=0.25 SENSITIVE Sensitive     GENTAMICIN  8 INTERMEDIATE Intermediate     OXACILLIN >=4 RESISTANT Resistant     TETRACYCLINE <=1 SENSITIVE Sensitive     VANCOMYCIN  <=0.5 SENSITIVE Sensitive     TRIMETH/SULFA 160 RESISTANT Resistant     CLINDAMYCIN <=0.25 SENSITIVE Sensitive     RIFAMPIN <=0.5 SENSITIVE Sensitive     Inducible Clindamycin NEGATIVE Sensitive     LINEZOLID  2 SENSITIVE Sensitive     * RARE METHICILLIN RESISTANT STAPHYLOCOCCUS AUREUS  MIC (1 Drug)-Right Groing abscess; 05/29/2023; Groin, Right; MRSA; Daptomycin      Status: Abnormal   Collection Time: 05/29/23  9:11 AM   Specimen: Groin, Right  Result Value Ref Range Status   Min Inhibitory Conc (1 Drug) Final report (A)  Corrected    Comment: (NOTE) Performed At: [facility] [address] [place], [place] [id number] Pearlean Botts MD OZ:[id number] CORRECTED ON 06/02 AT 6295: PREVIOUSLY REPORTED AS Preliminary report    Source WOUND RIGHT GROIN  Final    Comment: Performed at [facility], [address] [place], [place] [address]  MIC Result     Status: Abnormal   Collection Time: 05/29/23  9:11 AM  Result Value Ref Range Status   Result 1 (MIC) Comment (A)  Final    Comment: (NOTE) Methicillin - resistant Staphylococcus aureus Identification performed by account, not confirmed by this laboratory. DAPTOMYCIN    <=1 UG/ML = SUSCEPTIBLE Performed At: [facility] [address] [place], [place] [id number] Pearlean Botts MD ZD:[id number]   MRSA Next Gen by PCR, Nasal     Status: None   Collection Time: 05/29/23 12:21 PM   Specimen: Nasal  Mucosa; Nasal Swab  Result Value Ref Range Status   MRSA by PCR Next Gen NOT DETECTED NOT DETECTED Final    Comment: (NOTE) The GeneXpert MRSA Assay (FDA approved for NASAL specimens only), is one component of a comprehensive MRSA colonization surveillance program. It is not intended to diagnose MRSA infection nor to guide or  monitor treatment for MRSA infections. Test performance is not FDA approved in patients less than 18 years old. Performed at [facility], [address], [place], [place] [address]   Culture, blood (Routine X 2) w Reflex to ID Panel     Status: None   Collection Time: 05/30/23  5:42 PM   Specimen: BLOOD  Result Value Ref Range Status   Specimen Description BLOOD SITE NOT SPECIFIED  Final   Special Requests   Final    BOTTLES DRAWN AEROBIC ONLY Blood Culture adequate volume   Culture   Final    NO GROWTH 5 DAYS Performed at [facility], [address] [place], [place] [address]    Report Status 06/04/2023 FINAL  Final  Culture, blood (Routine X 2) w Reflex to ID Panel     Status: None   Collection Time: 05/30/23  5:42 PM   Specimen: BLOOD  Result Value Ref Range Status   Specimen Description BLOOD SITE NOT SPECIFIED  Final   Special Requests   Final    BOTTLES DRAWN AEROBIC ONLY Blood Culture adequate volume   Culture   Final    NO GROWTH 5 DAYS Performed at [facility], [address] [place], [place] [address]    Report Status 06/04/2023 FINAL  Final     Labs: BNP (last 3 results) No results for input(s): "BNP" in the last 8760 hours. Basic Metabolic Panel: Recent Labs  Lab 06/03/23 0415 06/04/23 0620 06/05/23 0600 06/06/23 0530 06/07/23 0500  NA 133* 130* 130* 133* 132*  K 4.1 4.0 3.6 3.7 4.0  CL 102 101 98 100 99  CO2 25 25 24 26 26   GLUCOSE 87 93 91 98 98  BUN 6* 6* <5* 8 11  CREATININE 0.64 0.68 0.73 0.62 0.53  CALCIUM  8.1* 8.4* 8.6* 8.6* 8.7*  PHOS 3.4 2.7 2.8 3.2 3.6   Liver Function Tests: Recent Labs  Lab 06/03/23 0415 06/04/23 0620 06/05/23 0600 06/06/23 0530 06/07/23 0500  ALBUMIN  1.7* 1.7* 1.9* 1.8* 1.9*   No results for input(s): "LIPASE", "AMYLASE" in the last 168 hours. No results for input(s): "AMMONIA" in the last 168 hours. CBC: Recent Labs  Lab 06/02/23 0500 06/03/23 0415 06/05/23 0500 06/06/23 0530  06/07/23 0550  WBC 22.0* 22.3* 14.8* 12.8* 15.6*  HGB 10.2* 10.2* 9.6* 8.8* 8.9*  HCT 30.7* 30.5* 28.6* 26.9* 27.8*  MCV 88.2 88.4 90.2 91.2 93.0  PLT 289 303 261 270 303   Cardiac Enzymes: Recent Labs  Lab 06/02/23 0500 06/06/23 0530  CKTOTAL 2,112* 3,217*   BNP: Invalid input(s): "POCBNP" CBG: Recent Labs  Lab 06/06/23 1119 06/06/23 1604 06/06/23 2223 06/07/23 0628 06/07/23 1056  GLUCAP 117* 139* 115* 101* 113*   D-Dimer No results for input(s): "DDIMER" in the last 72 hours. Hgb A1c No results for input(s): "HGBA1C" in the last 72 hours. Lipid Profile No results for input(s): "CHOL", "HDL", "LDLCALC", "TRIG", "CHOLHDL", "LDLDIRECT" in the last 72 hours. Thyroid  function studies No results for input(s): "TSH", "T4TOTAL", "T3FREE", "THYROIDAB" in the last 72 hours.  Invalid input(s): "FREET3" Anemia work up No results for input(s): "VITAMINB12", "  FOLATE", "FERRITIN", "TIBC", "IRON", "RETICCTPCT" in the last 72 hours. Urinalysis    Component Value Date/Time   COLORURINE AMBER (A) 05/28/2023 2130   APPEARANCEUR HAZY (A) 05/28/2023 2130   LABSPEC 1.014 05/28/2023 2130   PHURINE 5.0 05/28/2023 2130   GLUCOSEU NEGATIVE 05/28/2023 2130   HGBUR MODERATE (A) 05/28/2023 2130   BILIRUBINUR NEGATIVE 05/28/2023 2130   KETONESUR NEGATIVE 05/28/2023 2130   PROTEINUR 100 (A) 05/28/2023 2130   NITRITE NEGATIVE 05/28/2023 2130   LEUKOCYTESUR NEGATIVE 05/28/2023 2130   Sepsis Labs Recent Labs  Lab 06/03/23 0415 06/05/23 0500 06/06/23 0530 06/07/23 0550  WBC 22.3* 14.8* 12.8* 15.6*   Microbiology Recent Results (from the past 240 hours)  Resp panel by RT-PCR (RSV, Flu A&B, Covid) Anterior Nasal Swab     Status: None   Collection Time: 05/28/23  9:27 PM   Specimen: Anterior Nasal Swab  Result Value Ref Range Status   SARS Coronavirus 2 by RT PCR NEGATIVE NEGATIVE Final   Influenza A by PCR NEGATIVE NEGATIVE Final   Influenza B by PCR NEGATIVE NEGATIVE Final     Comment: (NOTE) The Xpert Xpress SARS-CoV-2/FLU/RSV plus assay is intended as an aid in the diagnosis of influenza from Nasopharyngeal swab specimens and should not be used as a sole basis for treatment. Nasal washings and aspirates are unacceptable for Xpert Xpress SARS-CoV-2/FLU/RSV testing.  Fact Sheet for Patients: BloggerCourse.com  Fact Sheet for Healthcare Providers: SeriousBroker.it  This test is not yet approved or cleared by the [place]  FDA and has been authorized for detection and/or diagnosis of SARS-CoV-2 by FDA under an Emergency Use Authorization (EUA). This EUA will remain in effect (meaning this test can be used) for the duration of the COVID-19 declaration under Section 564(b)(1) of the Act, 21 U.S.C. section 360bbb-3(b)(1), unless the authorization is terminated or revoked.     Resp Syncytial Virus by PCR NEGATIVE NEGATIVE Final    Comment: (NOTE) Fact Sheet for Patients: BloggerCourse.com  Fact Sheet for Healthcare Providers: SeriousBroker.it  This test is not yet approved or cleared by the [place]  FDA and has been authorized for detection and/or diagnosis of SARS-CoV-2 by FDA under an Emergency Use Authorization (EUA). This EUA will remain in effect (meaning this test can be used) for the duration of the COVID-19 declaration under Section 564(b)(1) of the Act, 21 U.S.C. section 360bbb-3(b)(1), unless the authorization is terminated or revoked.  Performed at [facility], [address] [place], [place] [address]   Blood Culture (routine x 2)     Status: Abnormal   Collection Time: 05/28/23  9:27 PM   Specimen: BLOOD RIGHT ARM  Result Value Ref Range Status   Specimen Description BLOOD RIGHT ARM  Final   Special Requests   Final    BOTTLES DRAWN AEROBIC ONLY Blood Culture results may not be optimal due to an inadequate volume of blood  received in culture bottles   Culture  Setup Time   Final    GRAM POSITIVE COCCI IN CHAINS BOTTLES DRAWN AEROBIC ONLY CRITICAL RESULT CALLED TO, READ BACK BY AND VERIFIED WITH: PHARMD ASerena Dana [phone] @ 2045 FH     Culture (A)  Final    GROUP A STREP (S.PYOGENES) ISOLATED HEALTH DEPARTMENT NOTIFIED Performed at [facility], [address], [place], [place] [address]    Report Status 05/31/2023 FINAL  Final   Organism ID, Bacteria GROUP A STREP (S.PYOGENES) ISOLATED  Final      Susceptibility  Group a strep (s.pyogenes) isolated - MIC*    PENICILLIN  <=0.06 SENSITIVE Sensitive     CEFTRIAXONE  <=0.12 SENSITIVE Sensitive     ERYTHROMYCIN <=0.12 SENSITIVE Sensitive     LEVOFLOXACIN 1 SENSITIVE Sensitive     VANCOMYCIN  0.5 SENSITIVE Sensitive     * GROUP A STREP (S.PYOGENES) ISOLATED  Blood Culture ID Panel (Reflexed)     Status: Abnormal   Collection Time: 05/28/23  9:27 PM  Result Value Ref Range Status   Enterococcus faecalis NOT DETECTED NOT DETECTED Final   Enterococcus Faecium NOT DETECTED NOT DETECTED Final   Listeria monocytogenes NOT DETECTED NOT DETECTED Final   Staphylococcus species NOT DETECTED NOT DETECTED Final   Staphylococcus aureus (BCID) NOT DETECTED NOT DETECTED Final   Staphylococcus epidermidis NOT DETECTED NOT DETECTED Final   Staphylococcus lugdunensis NOT DETECTED NOT DETECTED Final   Streptococcus species DETECTED (A) NOT DETECTED Final    Comment: CRITICAL RESULT CALLED TO, READ BACK BY AND VERIFIED WITH: PHARMD ASerena Dana [phone] @ 2045 FH     Streptococcus agalactiae NOT DETECTED NOT DETECTED Final   Streptococcus pneumoniae NOT DETECTED NOT DETECTED Final   Streptococcus pyogenes DETECTED (A) NOT DETECTED Final    Comment: CRITICAL RESULT CALLED TO, READ BACK BY AND VERIFIED WITH: PHARMD ASerena Dana [phone] @ 2045 FH     A.calcoaceticus-baumannii NOT DETECTED NOT DETECTED Final   Bacteroides fragilis NOT DETECTED NOT DETECTED Final   Enterobacterales  NOT DETECTED NOT DETECTED Final   Enterobacter cloacae complex NOT DETECTED NOT DETECTED Final   Escherichia coli NOT DETECTED NOT DETECTED Final   Klebsiella aerogenes NOT DETECTED NOT DETECTED Final   Klebsiella oxytoca NOT DETECTED NOT DETECTED Final   Klebsiella pneumoniae NOT DETECTED NOT DETECTED Final   Proteus species NOT DETECTED NOT DETECTED Final   Salmonella species NOT DETECTED NOT DETECTED Final   Serratia marcescens NOT DETECTED NOT DETECTED Final   Haemophilus influenzae NOT DETECTED NOT DETECTED Final   Neisseria meningitidis NOT DETECTED NOT DETECTED Final   Pseudomonas aeruginosa NOT DETECTED NOT DETECTED Final   Stenotrophomonas maltophilia NOT DETECTED NOT DETECTED Final   Candida albicans NOT DETECTED NOT DETECTED Final   Candida auris NOT DETECTED NOT DETECTED Final   Candida glabrata NOT DETECTED NOT DETECTED Final   Candida krusei NOT DETECTED NOT DETECTED Final   Candida parapsilosis NOT DETECTED NOT DETECTED Final   Candida tropicalis NOT DETECTED NOT DETECTED Final   Cryptococcus neoformans/gattii NOT DETECTED NOT DETECTED Final    Comment: Performed at [facility], [address], [place], [place] [address]  Blood Culture (routine x 2)     Status: None   Collection Time: 05/28/23 10:46 PM   Specimen: BLOOD  Result Value Ref Range Status   Specimen Description BLOOD BLOOD LEFT ARM  Final   Special Requests   Final    AEROBIC BOTTLE ONLY Blood Culture results may not be optimal due to an inadequate volume of blood received in culture bottles   Culture   Final    NO GROWTH 5 DAYS Performed at [facility], [address], [place], [place] [address]    Report Status 06/02/2023 FINAL  Final  Surgical PCR screen     Status: None   Collection Time: 05/29/23  2:55 AM   Specimen: Nasal Mucosa; Nasal Swab  Result Value Ref Range Status   MRSA, PCR NEGATIVE NEGATIVE Final   Staphylococcus aureus NEGATIVE NEGATIVE Final    Comment: (NOTE) The  Xpert SA Assay (FDA approved for NASAL specimens in patients 35 years of age and older), is one component of a comprehensive surveillance program. It is not intended to diagnose infection nor to guide or monitor treatment. Performed at [facility], [address], [place], [place] [address]   Aerobic/Anaerobic Culture w Gram Stain (surgical/deep wound)     Status: None (Preliminary result)   Collection Time: 05/29/23  9:11 AM   Specimen: Groin, Right; Abscess  Result Value Ref Range Status   Specimen Description ABSCESS  Final   Special Requests RIGHT GROIN  Final   Gram Stain   Final    WBC PRESENT, PREDOMINANTLY MONONUCLEAR RARE GRAM POSITIVE COCCI IN PAIRS    Culture   Final    RARE METHICILLIN RESISTANT STAPHYLOCOCCUS AUREUS MODERATE GROUP A STREP (S.PYOGENES) ISOLATED Beta hemolytic streptococci are predictably susceptible to penicillin  and other beta lactams. Susceptibility testing not routinely performed. NO ANAEROBES ISOLATED Sent to [facility] for further susceptibility testing. Performed at [facility], [address] [place], [place] [address]    Report Status PENDING  Incomplete   Organism ID, Bacteria METHICILLIN RESISTANT STAPHYLOCOCCUS AUREUS  Final      Susceptibility   Methicillin resistant staphylococcus aureus - MIC*    CIPROFLOXACIN >=8 RESISTANT Resistant     ERYTHROMYCIN <=0.25 SENSITIVE Sensitive     GENTAMICIN  8 INTERMEDIATE Intermediate     OXACILLIN >=4 RESISTANT Resistant     TETRACYCLINE <=1 SENSITIVE Sensitive     VANCOMYCIN  <=0.5 SENSITIVE Sensitive     TRIMETH/SULFA 160 RESISTANT Resistant     CLINDAMYCIN <=0.25 SENSITIVE Sensitive     RIFAMPIN <=0.5 SENSITIVE Sensitive     Inducible Clindamycin NEGATIVE Sensitive     LINEZOLID  2 SENSITIVE Sensitive     * RARE METHICILLIN RESISTANT STAPHYLOCOCCUS AUREUS  MIC (1 Drug)-Right Groing abscess; 05/29/2023; Groin, Right; MRSA; Daptomycin      Status: Abnormal   Collection Time: 05/29/23   9:11 AM   Specimen: Groin, Right  Result Value Ref Range Status   Min Inhibitory Conc (1 Drug) Final report (A)  Corrected    Comment: (NOTE) Performed At: [facility] [place] [address] [place], [place] [id number] Pearlean Botts MD WG:[id number] CORRECTED ON 06/02 AT 7846: PREVIOUSLY REPORTED AS Preliminary report    Source WOUND RIGHT GROIN  Final    Comment: Performed at [facility], [address] [place], [place] [address]  MIC Result     Status: Abnormal   Collection Time: 05/29/23  9:11 AM  Result Value Ref Range Status   Result 1 (MIC) Comment (A)  Final    Comment: (NOTE) Methicillin - resistant Staphylococcus aureus Identification performed by account, not confirmed by this laboratory. DAPTOMYCIN    <=1 UG/ML = SUSCEPTIBLE Performed At: [facility] [address] [place], [place] [id number] Pearlean Botts MD NU:[id number]   MRSA Next Gen by PCR, Nasal     Status: None   Collection Time: 05/29/23 12:21 PM   Specimen: Nasal Mucosa; Nasal Swab  Result Value Ref Range Status   MRSA by PCR Next Gen NOT DETECTED NOT DETECTED Final    Comment: (NOTE) The GeneXpert MRSA Assay (FDA approved for NASAL specimens only), is one component of a comprehensive MRSA colonization surveillance program. It is not intended to diagnose MRSA infection nor to guide or monitor treatment for MRSA infections. Test performance is not FDA approved in patients less than 66 years old. Performed at [facility], [address], [place], [place] [address]   Culture, blood (Routine X 2) w Reflex to ID Panel     Status: None   Collection Time: 05/30/23  5:42 PM   Specimen: BLOOD  Result Value Ref Range Status   Specimen Description BLOOD SITE NOT SPECIFIED  Final   Special Requests   Final    BOTTLES DRAWN AEROBIC ONLY Blood Culture adequate volume   Culture   Final    NO GROWTH 5 DAYS Performed at [facility], [address], [place], [place] [address]     Report Status 06/04/2023 FINAL  Final  Culture, blood (Routine X 2) w Reflex to ID Panel     Status: None   Collection Time: 05/30/23  5:42 PM   Specimen: BLOOD  Result Value Ref Range Status   Specimen Description BLOOD SITE NOT SPECIFIED  Final   Special Requests   Final    BOTTLES DRAWN AEROBIC ONLY Blood Culture adequate volume   Culture   Final    NO GROWTH 5 DAYS Performed at [facility], [address] [place], [place] [address]    Report Status 06/04/2023 FINAL  Final     Time coordinating discharge: Over 30 minutes  SIGNED:   Magdalene School, MD  [facility] 06/07/2023, 3:04 PM Pager   If 7PM-7AM, please contact night-coverage

## 2023-06-07 NOTE — H&P (Signed)
 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 Charlotte Cookey and discharged home 05/15/2023.  Per chart review patient lives with daughter and grandson.  1 level mobile home home 5 steps to entry.  Alternates between using a straight point cane and rolling walker for mobility.  Reportedly independent with ADLs.  Presented 05/28/2023 with chills/nausea with wound dehiscence/suspect sepsis with WBC 31,000 and underwent reexposure of right common femoral artery/popliteal artery with ligation of right axillobifemoral bypass graft excision of right common femoral artery excision of right femoral to above-knee popliteal artery bypass with above-knee popliteal artery ligation right sided greater saphenous vein saphenectomy as well as excision of right femoral to below-knee popliteal artery bypass 05/29/2023 per Dr. Rozanna Corner.  Wound VAC was placed to right groin.  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 Charlotte Cookey..  Hospital course acute blood loss anemia 8.9.  She was cleared to begin subcutaneous heparin  for DVT prophylaxis..  Patient initially placed on daptomycin  for wound coverage and plan to transition to doxycycline however patient with noted elevated CK to 3217 felt to be secondary to daptomycin  with infectious disease Dr. Artemio Larry consulted antibiotic transition to vancomycin  for now.  Therapy evaluations completed due to patient's decreased functional mobility was admitted for a comprehensive rehab program. Complains of right lower extremity residual limb pain but appears to be  well controlled at this time.  Review of Systems  Constitutional:  Negative for chills and fever.  HENT:  Negative for hearing loss.   Eyes:  Negative for blurred vision and double vision.  Respiratory:  Negative for cough, shortness of breath and wheezing.   Cardiovascular:  Positive for leg swelling. Negative for chest pain and palpitations.  Gastrointestinal:  Positive for constipation. Negative for heartburn, nausea and vomiting.  Genitourinary:  Negative for dysuria, flank pain and hematuria.  Musculoskeletal:  Positive for joint pain.  Skin:  Negative for rash.  Neurological:  Positive for sensory change.  All other systems reviewed and are negative.  Past Medical History:  Diagnosis Date   Cervical polyp 09/18/2018   Critical lower limb ischemia (HCC)    Encounter for screening mammogram for breast cancer 05/05/2016   Fibrocystic breast changes of both breasts 04/29/2015   Hyperlipidemia    Hypertension    PAD (peripheral artery disease) (HCC) 08/23/2019   Stroke (HCC) 08/23/2019   Past Surgical History:  Procedure Laterality Date   ABDOMINAL AORTOGRAM W/LOWER EXTREMITY Bilateral 08/23/2019   Procedure: ABDOMINAL AORTOGRAM W/ Bilateral LOWER EXTREMITY [place];  Surgeon: Richrd Char, MD;  [facility]: [facility];  Service: Cardiovascular;  Laterality: Bilateral;   AMPUTATION Right 05/31/2023   Procedure: AMPUTATION, ABOVE KNEE SARTORIOUS MUSCLE FLAP;  Surgeon: Margherita Shell, MD;  Location: [facility] OR;  Service: Vascular;  Laterality: Right;   APPLICATION OF WOUND VAC Right 05/31/2023   Procedure: APPLICATION, WOUND VAC;  Surgeon: Margherita Shell, MD;  Location: [facility] OR;  Service: Vascular;  Laterality: Right;   APPLICATION, SKIN SUBSTITUTE Right 05/12/2023   Procedure: APPLICATION, SKIN SUBSTITUTE KERECIS 38SQ CM;  Surgeon: Margherita Shell, MD;  Location: [facility] OR;  Service: Vascular;  Laterality: Right;   AVGG REMOVAL Right  05/29/2023   Procedure: REMOVAL BYPASS GRAFT RIGHT LEG;   Surgeon: Kayla Part, MD;  Location: [facility] OR;  Service: Vascular;  Laterality: Right;   AXILLARY-FEMORAL BYPASS GRAFT Right 11/11/2019   Procedure: BYPASS GRAFT RIGHT AXILLA-BIFEMORAL;  Surgeon: Richrd Char, MD;  Location: [facility] OR;  Service: Vascular;  Laterality: Right;   BRAIN SURGERY     BREAST SURGERY Bilateral 2014   1980 R breast surgery   CEREBRAL ANEURYSM REPAIR     ENDARTERECTOMY FEMORAL Bilateral 11/11/2019   Procedure: ENDARTERECTOMY FEMORAL BILATERALLY;  Surgeon: Richrd Char, MD;  Location: [facility] OR;  Service: Vascular;  Laterality: Bilateral;   ENDARTERECTOMY FEMORAL Right 01/26/2022   Procedure: REDO RIGHT COMMON FEMORAL ENDARTERECTOMY USING 0.8CM X 7.6CM HEMASHIELD PLATINUM FINESSE PATCH;  Surgeon: Margherita Shell, MD;  [facility]: [facility];  Service: Vascular;  Laterality: Right;   FEMORAL-POPLITEAL BYPASS GRAFT Right 01/26/2022   Procedure: RIGHT FEMORAL-POPLITEAL BYPASS GRAFT USING GORE X 80CM PROPATEN GRAFT;  Surgeon: Margherita Shell, MD;  Location: [facility];  Service: Vascular;  Laterality: Right;   FEMORAL-POPLITEAL BYPASS GRAFT Right 05/12/2023   Procedure: BYPASS GRAFT FEMORAL-POPLITEAL ARTERY;  Surgeon: Margherita Shell, MD;  [facility]: [facility];  Service: Vascular;  Laterality: Right;   INCISION AND DRAINAGE OF WOUND Right 05/31/2023   Procedure: IRRIGATION AND DEBRIDEMENT WOUND;  Surgeon: Margherita Shell, MD;  [facility]: [facility];  Service: Vascular;  Laterality: Right;  RIGHT GROIN WASHOUT   IR RADIOLOGIST EVAL & MGMT  04/02/2020   IR RADIOLOGIST EVAL & MGMT  03/29/2021   THROMBECTOMY FEMORAL ARTERY Right 01/26/2022   Procedure: THROMBECTOMY RIGHT FEMORAL ARTERY;  Surgeon: Margherita Shell, MD;  [facility]: [facility];  Service: Vascular;  Laterality: Right;   THROMBECTOMY OF BYPASS GRAFT FEMORAL- TIBIAL ARTERY Right 05/12/2023   Procedure: THROMBECTOMY, BYPASS GRAFT, ARTERIAL;  Surgeon: Margherita Shell, MD;  Location: [facility];  Service: Vascular;  Laterality: Right;   TUBAL LIGATION   1990   Family History  Problem Relation Age of Onset   Heart Problems Father    Heart Problems Brother    Heart Problems Brother    Social History:  reports that she quit smoking about 3 years ago. Her smoking use included cigarettes. She has never been exposed to tobacco smoke. She has never used smokeless tobacco. She reports that she does not drink alcohol  and does not use drugs. Allergies: No Active Allergies Medications Prior to Admission  Medication Sig Dispense Refill   alendronate (FOSAMAX) 70 MG tablet Take 70 mg by mouth once a week.     apixaban  (ELIQUIS ) 5 MG TABS tablet Take 1 tablet (5 mg total) by mouth 2 (two) times daily. 180 tablet 0   ezetimibe  (ZETIA ) 10 MG tablet Take 10 mg by mouth daily.     hydrALAZINE  (APRESOLINE ) 25 MG tablet Take 25 mg by mouth 3 (three) times daily as needed (for BP >180/100).     rosuvastatin  (CRESTOR ) 40 MG tablet Take 40 mg by mouth daily.     TIADYLT  ER 180 MG 24 hr capsule Take 180 mg by mouth daily.     valsartan (DIOVAN) 160 MG tablet Take 160 mg by mouth daily.     acetaminophen  (TYLENOL ) 325 MG tablet Take 2 tablets (650 mg total) by mouth every 4 (four) hours as needed for mild pain (pain score 1-3). (Patient not taking: Reported on 05/28/2023)     oxyCODONE -acetaminophen  (PERCOCET/ROXICET) 5-325 MG tablet Take 1 tablet by mouth every 8 (  eight) hours as needed for severe pain (pain score 7-10). (Patient not taking: Reported on 05/28/2023) 15 tablet 0   polyethylene glycol powder (GLYCOLAX /MIRALAX ) 17 GM/SCOOP powder Take 1 capful (17 g) by mouth daily as needed for mild constipation. 238 g 0   Home: Home Living Family/patient expects to be discharged to:: Private residence Living Arrangements: Children, Other relatives (daughter and grandson) Available Help at Discharge: Family, Available 24 hours/day Type of Home: Mobile home Home Access: Stairs to enter [employer] of Steps: 5 Entrance Stairs-Rails: Right, Left, Can  reach both Home Layout: One level Bathroom Shower/Tub: Health visitor: Standard Bathroom Accessibility: Yes Home Equipment: Cane - single point, Agricultural consultant (2 wheels)  Lives With: Daughter, Family   Functional History: Prior Function Prior Level of Function : Independent/Modified Independent Mobility Comments: Alternates between using SPC and RW. Reports no recent falls. ADLs Comments: Pt typically Independent with ADLs. Pt reports often taking a sponge bath due to the tub/shower bring small. Has family assist with driving   Functional Status:  Mobility: Bed Mobility Overal bed mobility: Needs Assistance Bed Mobility: Supine to Sit, Sit to Supine Supine to sit: Modified independent (Device/Increase time), Used rails, HOB elevated Sit to supine: Modified independent (Device/Increase time), HOB elevated, Used rails General bed mobility comments: extra time, exits R side of bed Transfers Overall transfer level: Needs assistance Equipment used: None Transfers: Bed to chair/wheelchair/BSC Sit to Stand: Contact guard assist, Min assist Bed to/from chair/wheelchair/BSC transfer type:: Squat pivot Squat pivot transfers: Contact guard assist Step pivot transfers: Contact guard assist General transfer comment: CGA to pivot from EOB<>BSC, good safety awareness and hand placement noted throughout transfer Ambulation/Gait Ambulation/Gait assistance: Contact guard assist Gait Distance (Feet): 18 Feet (x2) Assistive device: Rolling walker (2 wheels) Gait Pattern/deviations: Decreased step length - left, Decreased stride length, Step-to pattern General Gait Details: Pt ambulates in room with RW & CGA, good ability to use BUE to offload LLE so not hopping on L foot Gait velocity: decreased   ADL: ADL Overall ADL's : Needs assistance/impaired Eating/Feeding: Set up, Sitting Grooming: Wash/dry hands, Sitting, Set up Upper Body Bathing: Set up, Sitting Upper Body Bathing  Details (indicate cue type and reason): from EOB Lower Body Bathing: Contact guard assist, Sitting/lateral leans Lower Body Bathing Details (indicate cue type and reason): lateral leans to wash LB from EOB Upper Body Dressing : Minimal assistance, Sitting Upper Body Dressing Details (indicate cue type and reason): assist needed to manage tele wires Lower Body Dressing: Set up Lower Body Dressing Details (indicate cue type and reason): able to don sock via figure 4 with set- up assist from EOB Toilet Transfer: Contact guard assist, Squat-pivot, BSC/3in1 Toilet Transfer Details (indicate cue type and reason): squat pivot from EOB<>BSC Toileting- Clothing Manipulation and Hygiene: Sitting/lateral lean, Contact guard assist Toileting - Clothing Manipulation Details (indicate cue type and reason): completing anterior pericare via lateral leans with CGA Tub/Shower Transfer Details (indicate cue type and reason): discussed pt having walkin shower at home with lip, pts goal is to use crutches to transfer into shower Functional mobility during ADLs: Contact guard assist (squat pivot) General ADL Comments: ADL participation impacted by impaired balance   Cognition: Cognition Orientation Level: Oriented X4 Cognition Arousal: Alert Behavior During Therapy: WFL for tasks assessed/performed, Impulsive   Physical Exam: There were no vitals taken for this visit. Gen: no distress, normal appearing HEENT: oral mucosa pink and moist, NCAT Cardio: Reg rate Chest: normal effort, normal rate of breathing Abd:  soft, non-distended Ext: no edema Psych: pleasant, normal affect Skin: staples C/D/I, +RLE hematoma/skin darkening, +minimal bloody drainage, wound vac is in place Neuro: Alert and oriented x3 MMT: 5/5 with the exception of right lower extremity- AKA present  Results for orders placed or performed during the hospital encounter of 05/28/23 (from the past 48 hours)  Glucose, capillary     Status:  Abnormal   Collection Time: 06/05/23  3:57 PM  Result Value Ref Range   Glucose-Capillary 114 (H) 70 - 99 mg/dL    Comment: Glucose reference range applies only to samples taken after fasting for at least 8 hours.  Glucose, capillary     Status: Abnormal   Collection Time: 06/05/23  9:14 PM  Result Value Ref Range   Glucose-Capillary 103 (H) 70 - 99 mg/dL    Comment: Glucose reference range applies only to samples taken after fasting for at least 8 hours.   Comment 1 Notify RN    Comment 2 Document in Chart   Renal function panel     Status: Abnormal   Collection Time: 06/06/23  5:30 AM  Result Value Ref Range   Sodium 133 (L) 135 - 145 mmol/L   Potassium 3.7 3.5 - 5.1 mmol/L   Chloride 100 98 - 111 mmol/L   CO2 26 22 - 32 mmol/L   Glucose, Bld 98 70 - 99 mg/dL    Comment: Glucose reference range applies only to samples taken after fasting for at least 8 hours.   BUN 8 8 - 23 mg/dL   Creatinine, Ser 1.61 0.44 - 1.00 mg/dL   Calcium  8.6 (L) 8.9 - 10.3 mg/dL   Phosphorus 3.2 2.5 - 4.6 mg/dL   Albumin  1.8 (L) 3.5 - 5.0 g/dL   GFR, Estimated >09 >60 mL/min    Comment: (NOTE) Calculated using the CKD-EPI Creatinine Equation (2021)    Anion gap 7 5 - 15    Comment: Performed at [facility], [address] [place], [place] [address]  CK     Status: Abnormal   Collection Time: 06/06/23  5:30 AM  Result Value Ref Range   Total CK 3,217 (H) 38 - 234 U/L    Comment: Performed at [facility], [address], [place], [place] [address]  CBC     Status: Abnormal   Collection Time: 06/06/23  5:30 AM  Result Value Ref Range   WBC 12.8 (H) 4.0 - 10.5 K/uL   RBC 2.95 (L) 3.87 - 5.11 MIL/uL   Hemoglobin 8.8 (L) 12.0 - 15.0 g/dL   HCT 47.8 (L) 29.5 - 62.1 %   MCV 91.2 80.0 - 100.0 fL   MCH 29.8 26.0 - 34.0 pg   MCHC 32.7 30.0 - 36.0 g/dL   RDW 30.8 65.7 - 84.6 %   Platelets 270 150 - 400 K/uL   nRBC 0.0 0.0 - 0.2 %    Comment: Performed at [facility], [address] [place], [place] [address]  Glucose, capillary     Status: None   Collection Time: 06/06/23  6:08 AM  Result Value Ref Range   Glucose-Capillary 96 70 - 99 mg/dL    Comment: Glucose reference range applies only to samples taken after fasting for at least 8 hours.  Glucose, capillary     Status: Abnormal   Collection Time: 06/06/23 11:19 AM  Result Value Ref Range   Glucose-Capillary 117 (H) 70 - 99 mg/dL    Comment: Glucose  reference range applies only to samples taken after fasting for at least 8 hours.  Glucose, capillary     Status: Abnormal   Collection Time: 06/06/23  4:04 PM  Result Value Ref Range   Glucose-Capillary 139 (H) 70 - 99 mg/dL    Comment: Glucose reference range applies only to samples taken after fasting for at least 8 hours.  Glucose, capillary     Status: Abnormal   Collection Time: 06/06/23 10:23 PM  Result Value Ref Range   Glucose-Capillary 115 (H) 70 - 99 mg/dL    Comment: Glucose reference range applies only to samples taken after fasting for at least 8 hours.  Renal function panel     Status: Abnormal   Collection Time: 06/07/23  5:00 AM  Result Value Ref Range   Sodium 132 (L) 135 - 145 mmol/L   Potassium 4.0 3.5 - 5.1 mmol/L   Chloride 99 98 - 111 mmol/L   CO2 26 22 - 32 mmol/L   Glucose, Bld 98 70 - 99 mg/dL    Comment: Glucose reference range applies only to samples taken after fasting for at least 8 hours.   BUN 11 8 - 23 mg/dL   Creatinine, Ser 4.09 0.44 - 1.00 mg/dL   Calcium  8.7 (L) 8.9 - 10.3 mg/dL   Phosphorus 3.6 2.5 - 4.6 mg/dL   Albumin  1.9 (L) 3.5 - 5.0 g/dL   GFR, Estimated >81 >19 mL/min    Comment: (NOTE) Calculated using the CKD-EPI Creatinine Equation (2021)    Anion gap 7 5 - 15    Comment: Performed at [facility], [address] [place], [place] [address]  CBC     Status: Abnormal   Collection Time: 06/07/23  5:50 AM  Result Value Ref Range   WBC 15.6 (H) 4.0 - 10.5 K/uL   RBC 2.99 (L) 3.87 - 5.11 MIL/uL    Hemoglobin 8.9 (L) 12.0 - 15.0 g/dL   HCT 95.6 (L) 21.3 - 08.6 %   MCV 93.0 80.0 - 100.0 fL   MCH 29.8 26.0 - 34.0 pg   MCHC 32.0 30.0 - 36.0 g/dL   RDW 57.8 46.9 - 62.9 %   Platelets 303 150 - 400 K/uL   nRBC 0.0 0.0 - 0.2 %    Comment: Performed at [facility], [address] [place], [place] [address]  Glucose, capillary     Status: Abnormal   Collection Time: 06/07/23  6:28 AM  Result Value Ref Range   Glucose-Capillary 101 (H) 70 - 99 mg/dL    Comment: Glucose reference range applies only to samples taken after fasting for at least 8 hours.  Glucose, capillary     Status: Abnormal   Collection Time: 06/07/23 10:56 AM  Result Value Ref Range   Glucose-Capillary 113 (H) 70 - 99 mg/dL    Comment: Glucose reference range applies only to samples taken after fasting for at least 8 hours.   No results found.    There were no vitals taken for this visit.  Medical Problem List and Plan: 1. 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  Antithrombotics: -DVT/anticoagulation:  Pharmaceutical: Heparin   -antiplatelet therapy: continue Aspirin  81 mg daily  3. Pain Management: continue Neurontin  300 mg twice daily, oxycodone  as needed  4. Mood/Behavior/Sleep: Provide emotional support  -antipsychotic agents: N/A  5. Neuropsych/cognition: This patient is capable of making decisions on her own behalf.  6. Skin/Wound Care: Wound VAC right groin.  Follow-up wound care nurse.  7. Fluids/Electrolytes/Nutrition: Routine follow-up and outs follow-up chemistries  8.  Postoperative anemia.  Follow-up CBC  9.ID/Sepsis.  Daptomycin  changed to vancomycin  due to elevated CK.  Follow-up infectious disease.  Await long-term plan of care on antibiotic  10.  Hypertension.  BP reviewed: currently systolic is elevated and diastolic is soft:  Continue Avapro  75 mg daily, Norvasc  10 mg daily, hydralazine  25 mg every 8 hours, HCTZ 12.5 mg daily.  Monitor with increased mobility  11.  Hyperlipidemia.  Will discuss holding Crestor  with pharmacy due to elevated CK.  12. Hyperglycemia: hemoglobin A1c reviewed and is lower than normal, continue to monitor  Everlyn Hockey Angiulli, PA-C   I have personally performed a face to face diagnostic evaluation, including, but not limited to relevant history and physical exam findings, of this patient and developed relevant assessment and plan.  Additionally, I have reviewed and concur with the physician assistant's documentation above.  Deasia Chiu, MD

## 2023-06-07 NOTE — Progress Notes (Addendum)
 Progress Note    06/07/2023 6:55 AM 7 Days Post-Op  Subjective:  sitting up in bed eating breakfast.  Denies any pain.   afebrile  Vitals:   06/07/23 0354 06/07/23 0549  BP: (!) 138/49 (!) 134/56  Pulse: 70 65  Resp: 16 17  Temp: (!) 97.5 F (36.4 C)   SpO2: 100% 96%    Physical Exam: General:  no distress Lungs:  non labored Incisions:  right groin with vac with good seal; right medial thigh incision with staples in tact and clean.  Extremities:  bandage on mid incision with some bloody drainage present.  Medial aspect and overall stump unchanged in appearance.  Unable to express any drainage from incision.       CBC    Component Value Date/Time   WBC 12.8 (H) 06/06/2023 0530   RBC 2.95 (L) 06/06/2023 0530   HGB 8.8 (L) 06/06/2023 0530   HGB 14.2 03/23/2021 0928   HCT 26.9 (L) 06/06/2023 0530   HCT 42.1 03/23/2021 0928   PLT 270 06/06/2023 0530   PLT 135 (L) 03/23/2021 0928   MCV 91.2 06/06/2023 0530   MCV 93 03/23/2021 0928   MCH 29.8 06/06/2023 0530   MCHC 32.7 06/06/2023 0530   RDW 15.4 06/06/2023 0530   RDW 12.0 03/23/2021 0928   LYMPHSABS 1.5 05/28/2023 2120   LYMPHSABS 1.9 03/23/2021 0928   MONOABS 0.9 05/28/2023 2120   EOSABS 0.0 05/28/2023 2120   EOSABS 0.3 03/23/2021 0928   BASOSABS 0.1 05/28/2023 2120   BASOSABS 0.0 03/23/2021 0928    BMET    Component Value Date/Time   NA 133 (L) 06/06/2023 0530   NA 138 01/15/2020 1128   K 3.7 06/06/2023 0530   CL 100 06/06/2023 0530   CO2 26 06/06/2023 0530   GLUCOSE 98 06/06/2023 0530   BUN 8 06/06/2023 0530   BUN 8 01/15/2020 1128   CREATININE 0.62 06/06/2023 0530   CALCIUM  8.6 (L) 06/06/2023 0530   GFRNONAA >60 06/06/2023 0530   GFRAA 80 01/15/2020 1128    INR    Component Value Date/Time   INR 1.5 (H) 05/29/2023 0030     Intake/Output Summary (Last 24 hours) at 06/07/2023 0655 Last data filed at 06/07/2023 0555 Gross per 24 hour  Intake 2040.33 ml  Output 26 ml  Net 2014.33 ml       Assessment/Plan:  69 y.o. female is s/p:  Ligation of the right axillobifemoral bypass graft limb Excision of the right common femoral artery Excision of right femoral to above-knee popliteal artery bypass with above-knee popliteal artery ligation Right sided greater saphenous vein saphenectomy Excision of the right femoral to below-knee popliteal artery bypass with greater saphenous vein patch Femoral to above-knee popliteal bypass tunnel with 15 French drain Femoral to below-knee popliteal bypass tunnel with 19 French drain Groin with white and blue VAC sponge  05/29/2023 by Dr. Rosalva Comber   And    I&D right groin with Sartorious muscle flap, placement of Kerecis and abx beads and vac placement and right AKA on 05/31/2023 by Dr. Charlotte Cookey  7 Days Post-Op   -wound vac with good seal.  Overall appearance of stump appears unchanged.  There was bandage in place with bloody drainage overnight - I was unable to express any drainage from incision, therefore I left the bandage off.   -pt currently on Daptomycin  with end date of 7/9. Plan to transition to doxycycline, PCN for 2 months and the doxy for suppression indefinitely as she has  an axillobifemoral bypass (ligation of right limb) -DVT prophylaxis:  sq heparin  -pt has approval for CIR but no bed available yesterday.  Will follow peripherally when she goes to CIR.   Maryanna Smart, PA-C [facility] [phone] 06/07/2023 6:55 AM  I have seen and evaluated the patient. I agree with the PA note as documented above.  Right AKA unchanged since my initial evaluation on Saturday.  Okay for CIR.  VAC to the groin with good seal.  Young Hensen, MD [facility]: [phone]

## 2023-06-07 NOTE — Progress Notes (Signed)
 Mobility Specialist Progress Note:    06/07/23 1524  Mobility  Activity Transferred to/from [facility]  Level of Assistance Moderate assist, patient does 50-74%  Assistive Device Front wheel walker  Distance Ambulated (ft) 1 ft  RLE Weight Bearing Per Provider Order NWB  Activity Response Tolerated well  Mobility Referral Yes  Mobility visit 1 Mobility  Mobility Specialist Start Time (ACUTE ONLY) 1515  Mobility Specialist Stop Time (ACUTE ONLY) 1520  Mobility Specialist Time Calculation (min) (ACUTE ONLY) 5 min   Pt received in bed, requesting assistance to transfer B>BSC. Required ModA to stand with RW d/t R BKA. Tolerated well, asx throughout. Void successful, peri care completed independently. Returned pt to bed lying comfortably with all needs met, call bell in reach and bed alarm on.    Murielle Stang Mobility Specialist Please contact via Special educational needs teacher or  Rehab office at [phone]

## 2023-06-07 NOTE — Consult Note (Signed)
 WOC team consulted for management of wound vac.  WOC team followed patient as inpatient, last NPWT dressing change 06/05/2023 (see WOC consult note).  This is a weekly peel and place dressing and is due to be changed again Monday 06/12/2023.   WOC team will follow as above.   Thank you,    Ronni Colace MSN, RN-BC, [employer] [phone]

## 2023-06-07 NOTE — Progress Notes (Signed)
    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 Plan was for 6 weeks of Daptomycin . Notified of increasing CK levels with most recent from 06/06/23 being 3,217. Will discontinue daptomycin  and start vancomycin  for remainder of 6 weeks through 07/12/23.   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    Labs weekly while on IV antibiotics: _X_ CBC with differential _X_ BMP (Biweekly) __ CMP _X_ CRP _X_ ESR _X_ Vancomycin  trough __ CK  _X_ Please pull PIC at completion of IV antibiotics __ Please leave PIC in place until doctor has seen patient or been notified  Fax weekly labs to [phone]  Clinic Follow Up Appt: 06/21/23 at 10:45 am    Marlan Silva, NP [facility]  06/07/2023  1:25 PM

## 2023-06-07 NOTE — Discharge Instructions (Signed)
 Advised to follow-up with primary care physician in 1 week. Advised to follow-up with vascular surgery as scheduled. Patient being discharged on daptomycin  until 07/12/2023. At that time, ID recommend transition to doxycycline+penicillin  for 2 months after that doxycyline suppression indefinitely

## 2023-06-08 DIAGNOSIS — D62 Acute posthemorrhagic anemia: Secondary | ICD-10-CM

## 2023-06-08 DIAGNOSIS — Z89611 Acquired absence of right leg above knee: Secondary | ICD-10-CM

## 2023-06-08 DIAGNOSIS — I739 Peripheral vascular disease, unspecified: Secondary | ICD-10-CM | POA: Diagnosis not present

## 2023-06-08 DIAGNOSIS — E871 Hypo-osmolality and hyponatremia: Secondary | ICD-10-CM

## 2023-06-08 DIAGNOSIS — I10 Essential (primary) hypertension: Secondary | ICD-10-CM | POA: Diagnosis not present

## 2023-06-08 LAB — COMPREHENSIVE METABOLIC PANEL WITH GFR
ALT: 116 U/L — ABNORMAL HIGH (ref 0–44)
AST: 115 U/L — ABNORMAL HIGH (ref 15–41)
Albumin: 1.9 g/dL — ABNORMAL LOW (ref 3.5–5.0)
Alkaline Phosphatase: 72 U/L (ref 38–126)
Anion gap: 6 (ref 5–15)
BUN: 12 mg/dL (ref 8–23)
CO2: 27 mmol/L (ref 22–32)
Calcium: 8.7 mg/dL — ABNORMAL LOW (ref 8.9–10.3)
Chloride: 101 mmol/L (ref 98–111)
Creatinine, Ser: 0.55 mg/dL (ref 0.44–1.00)
GFR, Estimated: >60 mL/min (ref >60)
Glucose, Bld: 116 mg/dL — ABNORMAL HIGH (ref 70–99)
Potassium: 3.8 mmol/L (ref 3.5–5.1)
Sodium: 134 mmol/L — ABNORMAL LOW (ref 135–145)
Total Bilirubin: 0.8 mg/dL (ref 0.0–1.2)
Total Protein: 7.1 g/dL (ref 6.5–8.1)

## 2023-06-08 LAB — AEROBIC/ANAEROBIC CULTURE W GRAM STAIN (SURGICAL/DEEP WOUND)

## 2023-06-08 LAB — CBC WITH DIFFERENTIAL/PLATELET
Abs Immature Granulocytes: 0.34 10*3/uL — ABNORMAL HIGH (ref 0.00–0.07)
Basophils Absolute: 0 10*3/uL (ref 0.0–0.1)
Basophils Relative: 0 %
Eosinophils Absolute: 0.1 10*3/uL (ref 0.0–0.5)
Eosinophils Relative: 1 %
HCT: 28.3 % — ABNORMAL LOW (ref 36.0–46.0)
Hemoglobin: 8.6 g/dL — ABNORMAL LOW (ref 12.0–15.0)
Immature Granulocytes: 2 %
Lymphocytes Relative: 16 %
Lymphs Abs: 2.2 10*3/uL (ref 0.7–4.0)
MCH: 28.9 pg (ref 26.0–34.0)
MCHC: 30.4 g/dL (ref 30.0–36.0)
MCV: 95 fL (ref 80.0–100.0)
Monocytes Absolute: 1 10*3/uL (ref 0.1–1.0)
Monocytes Relative: 7 %
Neutro Abs: 10.4 10*3/uL — ABNORMAL HIGH (ref 1.7–7.7)
Neutrophils Relative %: 74 %
Platelets: 315 10*3/uL (ref 150–400)
RBC: 2.98 MIL/uL — ABNORMAL LOW (ref 3.87–5.11)
RDW: 15.7 % — ABNORMAL HIGH (ref 11.5–15.5)
WBC: 14.2 10*3/uL — ABNORMAL HIGH (ref 4.0–10.5)
nRBC: 0 % (ref 0.0–0.2)

## 2023-06-08 LAB — GLUCOSE, CAPILLARY
Glucose-Capillary: 106 mg/dL — ABNORMAL HIGH (ref 70–99)
Glucose-Capillary: 95 mg/dL (ref 70–99)
Glucose-Capillary: 105 mg/dL — ABNORMAL HIGH (ref 70–99)

## 2023-06-08 MED ORDER — SENNA 8.6 MG PO TABS
2.0000 | ORAL_TABLET | Freq: Every day | ORAL | Status: DC
  Administered 2023-06-08 – 2023-06-13 (×5): 17.2 mg via ORAL
  Filled 2023-06-08 (×6): qty 2

## 2023-06-08 MED ORDER — ZINC SULFATE 220 (50 ZN) MG PO CAPS
220.0000 mg | ORAL_CAPSULE | Freq: Every day | ORAL | Status: DC
  Administered 2023-06-08 – 2023-06-16 (×9): 220 mg via ORAL
  Filled 2023-06-08 (×9): qty 1

## 2023-06-08 MED ORDER — POLYETHYLENE GLYCOL 3350 17 G PO PACK
17.0000 g | PACK | Freq: Every day | ORAL | Status: DC
  Administered 2023-06-08 – 2023-06-15 (×8): 17 g via ORAL
  Filled 2023-06-08 (×9): qty 1

## 2023-06-08 MED ORDER — FERROUS SULFATE 325 (65 FE) MG PO TABS
325.0000 mg | ORAL_TABLET | Freq: Every day | ORAL | Status: DC
  Administered 2023-06-08 – 2023-06-16 (×9): 325 mg via ORAL
  Filled 2023-06-08 (×9): qty 1

## 2023-06-08 MED ORDER — VITAMIN C 500 MG PO TABS
250.0000 mg | ORAL_TABLET | Freq: Two times a day (BID) | ORAL | Status: DC
  Administered 2023-06-08 – 2023-06-16 (×16): 250 mg via ORAL
  Filled 2023-06-08 (×16): qty 1

## 2023-06-08 NOTE — Progress Notes (Signed)
  Progress Note    06/08/2023 7:35 AM * No surgery found *  Subjective:  denies any pain    Vitals:   06/07/23 2000 06/08/23 0540  BP: (!) 156/59 (!) 149/39  Pulse: 69 74  Resp:  16  Temp: 98.6 F (37 C) 98.1 F (36.7 C)  SpO2: 100% 100%    Physical Exam: General:  resting comfortably, NAD Lungs:  nonlabored Incisions:  right groin with peel and place vac with good seal. Right medial thigh incision intact and dry.  Extremities:  right AKA appears essentially unchanged with bloody drainage centrally and blistering medially       CBC    Component Value Date/Time   WBC 14.2 (H) 06/08/2023 0509   RBC 2.98 (L) 06/08/2023 0509   HGB 8.6 (L) 06/08/2023 0509   HGB 14.2 03/23/2021 0928   HCT 28.3 (L) 06/08/2023 0509   HCT 42.1 03/23/2021 0928   PLT 315 06/08/2023 0509   PLT 135 (L) 03/23/2021 0928   MCV 95.0 06/08/2023 0509   MCV 93 03/23/2021 0928   MCH 28.9 06/08/2023 0509   MCHC 30.4 06/08/2023 0509   RDW 15.7 (H) 06/08/2023 0509   RDW 12.0 03/23/2021 0928   LYMPHSABS 2.2 06/08/2023 0509   LYMPHSABS 1.9 03/23/2021 0928   MONOABS 1.0 06/08/2023 0509   EOSABS 0.1 06/08/2023 0509   EOSABS 0.3 03/23/2021 0928   BASOSABS 0.0 06/08/2023 0509   BASOSABS 0.0 03/23/2021 0928    BMET    Component Value Date/Time   NA 134 (L) 06/08/2023 0509   NA 138 01/15/2020 1128   K 3.8 06/08/2023 0509   CL 101 06/08/2023 0509   CO2 27 06/08/2023 0509   GLUCOSE 116 (H) 06/08/2023 0509   BUN 12 06/08/2023 0509   BUN 8 01/15/2020 1128   CREATININE 0.55 06/08/2023 0509   CALCIUM  8.7 (L) 06/08/2023 0509   GFRNONAA >60 06/08/2023 0509   GFRAA 80 01/15/2020 1128    INR    Component Value Date/Time   INR 1.5 (H) 05/29/2023 0030    No intake or output data in the 24 hours ending 06/08/23 0735    Assessment/Plan:  68 y.o. female is s/p:  Ligation of the right axillobifemoral bypass graft limb Excision of the right common femoral artery Excision of right femoral to  above-knee popliteal artery bypass with above-knee popliteal artery ligation Right sided greater saphenous vein saphenectomy Excision of the right femoral to below-knee popliteal artery bypass with greater saphenous vein patch Femoral to above-knee popliteal bypass tunnel with 15 French drain Femoral to below-knee popliteal bypass tunnel with 19 French drain Groin with white and blue VAC sponge  05/29/2023 by Dr. Rosalva Comber  I&D right groin with Sartorious muscle flap, placement of Kerecis and abx beads and vac placement and right AKA on 05/31/2023 by Dr. Charlotte Cookey  8 Days Post-Op   -She is doing well this morning without any complaints. She denies any pain -Right groin with wound vac with good seal. Plans for peel and place change on Monday with WOC team -Right AKA looks okay with some unchanged blistering medially   Deneise Finlay, PA-C [facility] [phone] 06/08/2023 7:35 AM

## 2023-06-08 NOTE — Progress Notes (Signed)
 Inpatient Rehabilitation Center Individual Statement of Services  Patient Name:  Katherine Archer  Date:  06/08/2023  Welcome to the Inpatient Rehabilitation Center.  Our goal is to provide you with an individualized program based on your diagnosis and situation, designed to meet your specific needs.  With this comprehensive rehabilitation program, you will be expected to participate in at least 3 hours of rehabilitation therapies Monday-Friday, with modified therapy programming on the weekends.  Your rehabilitation program will include the following services:  Physical Therapy (PT), Occupational Therapy (OT), 24 hour per day rehabilitation nursing, Therapeutic Recreaction (TR), Neuropsychology, Care Coordinator, Rehabilitation Medicine, Nutrition Services, and Pharmacy Services  Weekly team conferences will be held on Wednesday to discuss your progress.  Your Inpatient Rehabilitation Care Coordinator will talk with you frequently to get your input and to update you on team discussions.  Team conferences with you and your family in attendance may also be held.  Expected length of stay: 7-10 days  Overall anticipated outcome: independent with device  Depending on your progress and recovery, your program may change. Your Inpatient Rehabilitation Care Coordinator will coordinate services and will keep you informed of any changes. Your Inpatient Rehabilitation Care Coordinator's name and contact numbers are listed  below.  The following services may also be recommended but are not provided by the Inpatient Rehabilitation Center:   Home Health Rehabiltiation Services Outpatient Rehabilitation Services    Arrangements will be made to provide these services after discharge if needed.  Arrangements include referral to agencies that provide these services.  Your insurance has been verified to be:  [employer] Your primary doctor is:  Manette Section  Pertinent information will be shared with your  doctor and your insurance company.  Inpatient Rehabilitation Care Coordinator:  Adrianna Albee, Buzz Cass [phone] or Justine Oms  Information discussed with and copy given to patient by: Mardell Shade, 06/08/2023, 11:26 AM

## 2023-06-08 NOTE — Evaluation (Signed)
 Occupational Therapy Assessment and Plan  Patient Details  Name: Katherine Archer MRN: [id number] Date of Birth: [birth year]  OT Diagnosis: acute pain, muscle weakness (generalized), and decreased activity tolerance Rehab Potential: Rehab Potential (ACUTE ONLY): Good ELOS: 7-10 days   Today's Date: 06/08/2023 OT Individual Time: 1105-1200 OT Individual Time Calculation (min): 55 min     Hospital Problem: Principal Problem:   Right above-knee amputee [facility])   Past Medical History:  Past Medical History:  Diagnosis Date   Cervical polyp 09/18/2018   Critical lower limb ischemia (HCC)    Encounter for screening mammogram for breast cancer 05/05/2016   Fibrocystic breast changes of both breasts 04/29/2015   Hyperlipidemia    Hypertension    PAD (peripheral artery disease) (HCC) 08/23/2019   Stroke (HCC) 08/23/2019   Past Surgical History:  Past Surgical History:  Procedure Laterality Date   ABDOMINAL AORTOGRAM W/LOWER EXTREMITY Bilateral 08/23/2019   Procedure: ABDOMINAL AORTOGRAM W/ Bilateral LOWER EXTREMITY [place];  Surgeon: Richrd Char, MD;  Location: [facility] INVASIVE CV LAB;  Service: Cardiovascular;  Laterality: Bilateral;   AMPUTATION Right 05/31/2023   Procedure: AMPUTATION, ABOVE KNEE SARTORIOUS MUSCLE FLAP;  Surgeon: Margherita Shell, MD;  Location: [facility] OR;  Service: Vascular;  Laterality: Right;   APPLICATION OF WOUND VAC Right 05/31/2023   Procedure: APPLICATION, WOUND VAC;  Surgeon: Margherita Shell, MD;  Location: [facility] OR;  Service: Vascular;  Laterality: Right;   APPLICATION, SKIN SUBSTITUTE Right 05/12/2023   Procedure: APPLICATION, SKIN SUBSTITUTE KERECIS 38SQ CM;  Surgeon: Margherita Shell, MD;  Location: [facility] OR;  Service: Vascular;  Laterality: Right;   AVGG REMOVAL Right 05/29/2023   Procedure: REMOVAL BYPASS GRAFT RIGHT LEG;  Surgeon: Kayla Part, MD;  Location: [facility] OR;  Service: Vascular;  Laterality: Right;   AXILLARY-FEMORAL BYPASS GRAFT Right 11/11/2019   Procedure: BYPASS  GRAFT RIGHT AXILLA-BIFEMORAL;  Surgeon: Richrd Char, MD;  Location: [facility] OR;  Service: Vascular;  Laterality: Right;   BRAIN SURGERY     BREAST SURGERY Bilateral 2014   1980 R breast surgery   CEREBRAL ANEURYSM REPAIR     ENDARTERECTOMY FEMORAL Bilateral 11/11/2019   Procedure: ENDARTERECTOMY FEMORAL BILATERALLY;  Surgeon: Richrd Char, MD;  Location[facility] OR;  Service: Vascular;  Laterality: Bilateral;   ENDARTERECTOMY FEMORAL Right 01/26/2022   Procedure: REDO RIGHT COMMON FEMORAL ENDARTERECTOMY USING 0.8CM X 7.6CM HEMASHIELD PLATINUM FINESSE PATCH;  Surgeon: Margherita Shell, MD;  Location: [facility] OR;  Service: Vascular;  Laterality: Right;   FEMORAL-POPLITEAL BYPASS GRAFT Right 01/26/2022   Procedure: RIGHT FEMORAL-POPLITEAL BYPASS GRAFT USING GORE X 80CM PROPATEN GRAFT;  Surgeon: Margherita Shell, MD;  Location: [facility] OR;  Service: Vascular;  Laterality: Right;   FEMORAL-POPLITEAL BYPASS GRAFT Right 05/12/2023   Procedure: BYPASS GRAFT FEMORAL-POPLITEAL ARTERY;  Surgeon: Margherita Shell, MD;  Location: [facility] OR;  Service: Vascular;  Laterality: Right;   INCISION AND DRAINAGE OF WOUND Right 05/31/2023   Procedure: IRRIGATION AND DEBRIDEMENT WOUND;  Surgeon: Margherita Shell, MD;  Location: [facility] OR;  Service: Vascular;  Laterality: Right;  RIGHT GROIN WASHOUT   IR RADIOLOGIST EVAL & MGMT  04/02/2020   IR RADIOLOGIST EVAL & MGMT  03/29/2021   THROMBECTOMY FEMORAL ARTERY Right 01/26/2022   Procedure: THROMBECTOMY RIGHT FEMORAL ARTERY;  Surgeon: Margherita Shell, MD;  Location: [facility] OR;  Service: Vascular;  Laterality: Right;   THROMBECTOMY OF BYPASS GRAFT FEMORAL- TIBIAL ARTERY Right 05/12/2023   Procedure: THROMBECTOMY, BYPASS GRAFT, ARTERIAL;  Surgeon: Charlotte Cookey,  Josetta Niece, MD;  Location: [facility] OR;  Service: Vascular;  Laterality: Right;   TUBAL LIGATION  1990    Assessment & Plan Clinical Impression: Patient is a 68 year old right-handed non-English-speaking female with history of hypertension, CVA,  hyperlipidemia, quit smoking 3 years ago, PAD maintained on Eliquis  with multiple revascularizations including s/p right femoral-popliteal BGD 05/12/2023 per Dr. Charlotte Cookey and discharged home 05/15/2023.  Per chart review patient lives with daughter and grandson.  1 level mobile home home 5 steps to entry.  Alternates between using a straight point cane and rolling walker for mobility.  Reportedly independent with ADLs.  Presented 05/28/2023 with chills/nausea with wound dehiscence/suspect sepsis with WBC 31,000 and underwent reexposure of right common femoral artery/popliteal artery with ligation of right axillobifemoral bypass graft excision of right common femoral artery excision of right femoral to above-knee popliteal artery bypass with above-knee popliteal artery ligation right sided greater saphenous vein saphenectomy as well as excision of right femoral to below-knee popliteal artery bypass 05/29/2023 per Dr. Rozanna Corner.  Wound VAC was placed to right groin.  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 Charlotte Cookey. [facility] course acute blood loss anemia 8.9.  She was cleared to begin subcutaneous heparin  for DVT prophylaxis..  Patient initially placed on daptomycin  for wound coverage and plan to transition to doxycycline however patient with noted elevated CK to 3217 felt to be secondary to daptomycin  with infectious disease Dr. Artemio Larry consulted antibiotic transition to vancomycin  for now.  Therapy evaluations completed due to patient's decreased functional mobility was admitted for a comprehensive rehab program. Complains of right lower extremity residual limb pain but appears to be well controlled at this time. Patient transferred to CIR on 06/07/2023 .    Patient currently requires min with basic self-care skills secondary to muscle weakness, decreased cardiorespiratoy endurance, and decreased standing balance and decreased balance strategies.  Prior to  hospitalization, patient could complete BADLs with independent .  Patient will benefit from skilled intervention to increase independence with basic self-care skills and increase level of independence with iADL prior to discharge home with care partner.  Anticipate patient will require intermittent supervision and no further OT follow recommended.  OT - End of Session Activity Tolerance: Tolerates 10 - 20 min activity with multiple rests Endurance Deficit: Yes OT Assessment Rehab Potential (ACUTE ONLY): Good OT Barriers to Discharge: Decreased caregiver support;Home environment access/layout;Wound Care;Weight bearing restrictions OT Patient demonstrates impairments in the following area(s): Balance;Endurance;Pain;Safety;Skin Integrity OT Basic ADL's Functional Problem(s): Bathing;Dressing;Toileting OT Advanced ADL's Functional Problem(s): Simple Meal Preparation;Light Housekeeping OT Transfers Functional Problem(s): Toilet;Tub/Shower OT Additional Impairment(s): None OT Plan OT Intensity: Minimum of 1-2 x/day, 45 to 90 minutes OT Frequency: 5 out of 7 days OT Duration/Estimated Length of Stay: 7-10 days OT Treatment/Interventions: Balance/vestibular training;Community reintegration;Discharge planning;Disease mangement/prevention;DME/adaptive equipment instruction;Functional electrical stimulation;Functional mobility training;Cognitive remediation/compensation;Neuromuscular re-education;Pain management;Patient/family education;Psychosocial support;UE/LE Strength taining/ROM;Therapeutic Exercise;Therapeutic Activities;Splinting/orthotics;Wheelchair propulsion/positioning;Skin care/wound managment;Visual/perceptual remediation/compensation;Self Care/advanced ADL retraining;UE/LE Coordination activities OT Basic Self-Care Anticipated Outcome(s): Mod I OT Toileting Anticipated Outcome(s): Mod I OT Bathroom Transfers Anticipated Outcome(s): Mod I OT Recommendation Recommendations for Other  Services: Neuropsych consult Patient destination: Home Follow Up Recommendations: Home health OT vs No OT-follow up  Equipment Recommended: To be determined   OT Evaluation Precautions/Restrictions  Precautions Precautions: Fall Recall of Precautions/Restrictions: Intact Precaution/Restrictions Comments: R AKA, wound vac, RUE PICC-IV antibiotics Restrictions Weight Bearing Restrictions Per Provider Order: Yes RLE Weight Bearing Per Provider Order: Non weight bearing General Chart Reviewed: Yes  Family/Caregiver Present: No Vital Signs Therapy Vitals Pulse Rate: 67 BP: (!) 160/57 Patient Position (if appropriate): Lying Pain Pain Assessment Pain Scale: 0-10 Home Living/Prior Functioning Home Living Family/patient expects to be discharged to:: Private residence Living Arrangements: Children, Other relatives Available Help at Discharge: Family, Available 24 hours/day (Grand-daughter works from home.) Type of Home: Mobile home Home Access: Ramped entrance (Planning to build a ramp.) Home Layout: One level Bathroom Shower/Tub: Other (comment) (Patient unaware of bathroom setup.)  Lives With: Family (Plans to discharge to grand-daughter's home.) Prior Function Level of Independence: Independent with basic ADLs, Independent with homemaking with ambulation, Independent with gait, Independent with transfers, Requires assistive device for independence (Intermeidate use of SPC.) Vision Baseline Vision/History: 0 No visual deficits Ability to See in Adequate Light: 0 Adequate Patient Visual Report: No change from baseline Vision Assessment?: No apparent visual deficits Perception  Perception: Within Functional Limits Praxis Praxis: WFL Cognition Cognition Overall Cognitive Status: Within Functional Limits for tasks assessed Arousal/Alertness: Awake/alert Memory: Appears intact Awareness: Appears intact Problem Solving: Appears intact Safety/Judgment: Appears intact Brief  Interview for Mental Status (BIMS) Repetition of Three Words (First Attempt): 3 Temporal Orientation: Year: Correct Temporal Orientation: Month: Accurate within 5 days Temporal Orientation: Day: Incorrect Recall: "Sock": Yes, no cue required Recall: "Blue": Yes, no cue required Recall: "Bed": Yes, no cue required BIMS Summary Score: 14 Sensation Sensation Light Touch: Impaired Detail Peripheral sensation comments: LLE numbness, 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 Light Touch Impaired Details: Impaired RUE;Impaired LLE Hot/Cold: Appears Intact Proprioception: Impaired by gross assessment Coordination Gross Motor Movements are Fluid and Coordinated: No Fine Motor Movements are Fluid and Coordinated: Yes Coordination and Movement Description: Deficits due to R AKA. Motor  Motor Motor: Within Functional Limits  Trunk/Postural Assessment  Cervical Assessment Cervical Assessment: Exceptions to [facility] (Forward head) Thoracic Assessment Thoracic Assessment: Exceptions to [facility] (Rounded shoulders) Lumbar Assessment Lumbar Assessment: Exceptions to [facility] (Posterior pelvic tilt) Postural Control Postural Control: Within Functional Limits  Balance Balance Balance Assessed: Yes Static Sitting Balance Static Sitting - Balance Support: Feet supported Static Sitting - Level of Assistance: 7: Independent Dynamic Sitting Balance Dynamic Sitting - Balance Support: During functional activity Dynamic Sitting - Level of Assistance: 6: Modified independent (Device/Increase time) Static Standing Balance Static Standing - Balance Support: Bilateral upper extremity supported Static Standing - Level of Assistance: 5: Stand by assistance (SUP-CGA) Dynamic Standing Balance Dynamic Standing - Balance Support: During functional activity Dynamic Standing - Level of Assistance: 5: Stand by assistance (CGA) Extremity/Trunk Assessment RUE Assessment RUE Assessment: Within Functional Limits LUE  Assessment LUE Assessment: Exceptions to [facility] Active Range of Motion (AROM) Comments: WFL General Strength Comments: 3+/5  Care Tool Care Tool Self Care Eating   Eating Assist Level: Independent    Oral Care    Oral Care Assist Level: Set up assist    Bathing   Body parts bathed by patient: Right arm;Left arm;Chest;Abdomen;Front perineal area;Buttocks;Right upper leg;Left upper leg;Left lower leg;Face   Body parts n/a: Right lower leg Assist Level: Contact Guard/Touching assist    Upper Body Dressing(including orthotics)   What is the patient wearing?: Pull over shirt   Assist Level: Set up assist    Lower Body Dressing (excluding footwear)   What is the patient wearing?: Pants Assist for lower body dressing: Contact Guard/Touching assist    Putting on/Taking off footwear   What is the patient wearing?: Non-skid slipper socks Assist for footwear: Set up assist       Care  Tool Toileting Toileting activity   Assist for toileting: Contact Guard/Touching assist     Care Tool Bed Mobility Roll left and right activity        Sit to lying activity        Lying to sitting on side of bed activity         Care Tool Transfers Sit to stand transfer        Chair/bed transfer         Toilet transfer   Assist Level: Contact Guard/Touching assist     Care Tool Cognition  Expression of Ideas and Wants Expression of Ideas and Wants: 4. Without difficulty (complex and basic) - expresses complex messages without difficulty and with speech that is clear and easy to understand  Understanding Verbal and Non-Verbal Content Understanding Verbal and Non-Verbal Content: 4. Understands (complex and basic) - clear comprehension without cues or repetitions   Memory/Recall Ability Memory/Recall Ability : Current season;Location of own room;Staff names and faces;That he or she is in a hospital/hospital unit   Refer to Care Plan for Long Term Goals  SHORT TERM GOAL WEEK 1 OT Short  Term Goal 1 (Week 1): STGs=LTGs due to patient's estimated length of stay.  Recommendations for other services: Neuropsych   Skilled Therapeutic Intervention Session began with introduction to OT role, OT POC, and general orientation to rehab unit/schedule. Pt completes full-body dressing with levels of assistance noted below. Pt performs step-hop transfer from EOB>recliner with CGA + RW. Pt remained sitting in recliner with LPN present to administer care. ADL ADL Eating: Independent Where Assessed-Eating: Bed level;Chair Grooming: Supervision/safety;Setup Where Assessed-Grooming: Edge of bed;Chair Upper Body Bathing: Supervision/safety;Setup Where Assessed-Upper Body Bathing: Edge of bed Lower Body Bathing: Contact guard Where Assessed-Lower Body Bathing: Edge of bed Upper Body Dressing: Supervision/safety;Setup Where Assessed-Upper Body Dressing: Edge of bed Lower Body Dressing: Contact guard Where Assessed-Lower Body Dressing: Edge of bed Toileting: Contact guard Where Assessed-Toileting: Teacher, adult education: Furniture conservator/restorer Method: Proofreader: Psychiatric nurse: Unable to assess Mobility  Transfers Sit to Stand: Contact Guard/Touching assist Stand to Sit: Contact Guard/Touching assist   Discharge Criteria: Patient will be discharged from OT if patient refuses treatment 3 consecutive times without medical reason, if treatment goals not met, if there is a change in medical status, if patient makes no progress towards goals or if patient is discharged from hospital.  The above assessment, treatment plan, treatment alternatives and goals were discussed and mutually agreed upon: by patient  Artemus Biles, OTR/L, MSOT  06/08/2023, 12:15 PM

## 2023-06-08 NOTE — Progress Notes (Signed)
 Initial Nutrition Assessment  DOCUMENTATION CODES:   Severe malnutrition in context of chronic illness  INTERVENTION:   Continue Ensure Plus High Protein po BID, each supplement provides 350 kcal and 20 grams of protein.  Discussed importance of protein intake at meals and snacks for wound healing  Add  250 mg Vitamin C BID 220 mg zinc  sulfate x 14 days  NUTRITION DIAGNOSIS:   Severe Malnutrition related to chronic illness as evidenced by severe muscle depletion, severe fat depletion.  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 Pt is non-English speaking.   Spoke with pt who reports weight loss recently but unsure how much. Pt had been given an ensure max and had drank it. Confirmed with RN order for ensure plus/high protein.   Per vascular pt with ongoing blistering at AKA site.  Meal Completion: 100%  Medications reviewed and include: ensure plus high protein BID, 325 mg ferrous sulfate, miralax , senokot  Labs reviewed:  WBC 14.2    NUTRITION - FOCUSED PHYSICAL EXAM:  Flowsheet Row Most Recent Value  Orbital Region Severe depletion  Upper Arm Region Severe depletion  Thoracic and Lumbar Region Severe depletion  Buccal Region Severe depletion  Temple Region Severe depletion  Clavicle Bone Region Severe depletion  Clavicle and Acromion Bone Region Severe depletion  Scapular Bone Region Severe depletion  Dorsal Hand Severe depletion  Patellar Region Severe depletion  Anterior Thigh Region Severe depletion  Posterior Calf Region Severe depletion  Edema (RD Assessment) None  Hair  Reviewed  Eyes Reviewed  Mouth Reviewed  Skin Reviewed  Nails Reviewed       Diet Order:   Diet Order             Diet regular Room service appropriate? Yes; Fluid consistency: Thin  Diet effective now                   EDUCATION NEEDS:   Education needs have been addressed  Skin:  Skin Assessment:  (R AKA)  Last BM:  6/3  Height:   Ht Readings from Last 1 Encounters:  06/07/23 5\' 6"  (1.676 m)    Weight:   Wt Readings from Last 1 Encounters:  06/07/23 65.8 kg    BMI:  Body mass index is 23.41 kg/m.  Estimated Nutritional Needs:   Kcal:  1700-1900  Protein:  85-100 grams  Fluid:  >1.7 L/day  Randine Butcher., RD, LDN, CNSC See AMiON for contact information

## 2023-06-08 NOTE — Progress Notes (Signed)
 Inpatient Rehabilitation Care Coordinator Assessment and Plan Patient Details  Name: Katherine Archer MRN: [id number] Date of Birth: 1955/12/09  Today's Date: 06/08/2023  Hospital Problems: Principal Problem:   Right above-knee amputee [facility])  Past Medical History:  Past Medical History:  Diagnosis Date   Cervical polyp 09/18/2018   Critical lower limb ischemia (HCC)    Encounter for screening mammogram for breast cancer 05/05/2016   Fibrocystic breast changes of both breasts 04/29/2015   Hyperlipidemia    Hypertension    PAD (peripheral artery disease) (HCC) 08/23/2019   Stroke (HCC) 08/23/2019   Past Surgical History:  Past Surgical History:  Procedure Laterality Date   ABDOMINAL AORTOGRAM W/LOWER EXTREMITY Bilateral 08/23/2019   Procedure: ABDOMINAL AORTOGRAM W/ Bilateral LOWER EXTREMITY [place];  Surgeon: Richrd Char, MD;  Location: [facility];  Service: Cardiovascular;  Laterality: Bilateral;   AMPUTATION Right 05/31/2023   Procedure: AMPUTATION, ABOVE KNEE SARTORIOUS MUSCLE FLAP;  Surgeon: Margherita Shell, MD;  Location: [facility] OR;  Service: Vascular;  Laterality: Right;   APPLICATION OF WOUND VAC Right 05/31/2023   Procedure: APPLICATION, WOUND VAC;  Surgeon: Margherita Shell, MD;  Location: [facility] OR;  Service: Vascular;  Laterality: Right;   APPLICATION, SKIN SUBSTITUTE Right 05/12/2023   Procedure: APPLICATION, SKIN SUBSTITUTE KERECIS 38SQ CM;  Surgeon: Margherita Shell, MD;  Location: [facility] OR;  Service: Vascular;  Laterality: Right;   AVGG REMOVAL Right 05/29/2023   Procedure: REMOVAL BYPASS GRAFT RIGHT LEG;  Surgeon: Kayla Part, MD;  Location: [facility] OR;  Service: Vascular;  Laterality: Right;   AXILLARY-FEMORAL BYPASS GRAFT Right 11/11/2019   Procedure: BYPASS GRAFT RIGHT AXILLA-BIFEMORAL;  Surgeon: Richrd Char, MD;  Location: [facility] OR;  Service: Vascular;  Laterality: Right;   BRAIN SURGERY     BREAST SURGERY Bilateral 2014   1980 R breast surgery   CEREBRAL ANEURYSM REPAIR      ENDARTERECTOMY FEMORAL Bilateral 11/11/2019   Procedure: ENDARTERECTOMY FEMORAL BILATERALLY;  Surgeon: Richrd Char, MD;  Location: [facility] OR;  Service: Vascular;  Laterality: Bilateral;   ENDARTERECTOMY FEMORAL Right 01/26/2022   Procedure: REDO RIGHT COMMON FEMORAL ENDARTERECTOMY USING 0.8CM X 7.6CM HEMASHIELD PLATINUM FINESSE PATCH;  Surgeon: Margherita Shell, MD;  Location: [facility] OR;  Service: Vascular;  Laterality: Right;   FEMORAL-POPLITEAL BYPASS GRAFT Right 01/26/2022   Procedure: RIGHT FEMORAL-POPLITEAL BYPASS GRAFT USING GORE X 80CM PROPATEN GRAFT;  Surgeon: Margherita Shell, MD;  Location: [facility] OR;  Service: Vascular;  Laterality: Right;   FEMORAL-POPLITEAL BYPASS GRAFT Right 05/12/2023   Procedure: BYPASS GRAFT FEMORAL-POPLITEAL ARTERY;  Surgeon: Margherita Shell, MD;  Location: [facility];  Service: Vascular;  Laterality: Right;   INCISION AND DRAINAGE OF WOUND Right 05/31/2023   Procedure: IRRIGATION AND DEBRIDEMENT WOUND;  Surgeon: Margherita Shell, MD;  Location: [facility] OR;  Service: Vascular;  Laterality: Right;  RIGHT GROIN WASHOUT   IR RADIOLOGIST EVAL & MGMT  04/02/2020   IR RADIOLOGIST EVAL & MGMT  03/29/2021   THROMBECTOMY FEMORAL ARTERY Right 01/26/2022   Procedure: THROMBECTOMY RIGHT FEMORAL ARTERY;  Surgeon: Margherita Shell, MD;  Location: [facility] OR;  Service: Vascular;  Laterality: Right;   THROMBECTOMY OF BYPASS GRAFT FEMORAL- TIBIAL ARTERY Right 05/12/2023   Procedure: THROMBECTOMY, BYPASS GRAFT, ARTERIAL;  Surgeon: Margherita Shell, MD;  Location: [facility] OR;  Service: Vascular;  Laterality: Right;   TUBAL LIGATION  1990   Social History:  reports that she quit smoking about 3 years ago. Her smoking use included  cigarettes. She has never been exposed to tobacco smoke. She has never used smokeless tobacco. She reports that she does not drink alcohol  and does not use drugs.  Family / Support Systems Marital Status: Divorced Patient Roles: Parent, Other (Comment) (Grandparent) Children:  luz-daughter [phone]  Annie-daughter [phone] Other Supports: melody-granddaughter [phone]  Pam Bode [phone] Anticipated Caregiver: melody Ability/Limitations of Caregiver: Granddaughter can provide assist according to pt Caregiver Availability: 24/7 Family Dynamics: Close knit with family and extended family, she lives with gradndaughter who will be assisting her at discharge.  Social History Preferred language: Spanish Religion: None Cultural Background: Speaks Spanish and needs an Engineer, technical sales Education: HS Health Literacy - How often do you need to have someone help you when you read instructions, pamphlets, or other written material from your doctor or pharmacy?: Never Writes: Yes Employment Status: Retired Marine scientist Issues: NA Guardian/Conservator: None-according to MD pt is capable of making her own decisions while here   Abuse/Neglect Abuse/Neglect Assessment Can Be Completed: Yes Physical Abuse: Denies Verbal Abuse: Denies Sexual Abuse: Denies Exploitation of patient/patient's resources: Denies Self-Neglect: Denies  Patient response to: Social Isolation - How often do you feel lonely or isolated from those around you?: Rarely  Emotional Status Pt's affect, behavior and adjustment status: Pt is motivated to do well and regain as much independence as she can. She is not feelng well today but is still trying to do therapies. Recent Psychosocial Issues: other health issues Psychiatric History: No history/issues may benefit from seeing neuro-psych while here Substance Abuse History: NA  Patient / Family Perceptions, Expectations & Goals Pt/Family understanding of illness & functional limitations: Pt can explain her amputation and issues surrounding it. She is trying to do well and does talk with the MD when able. She does defer to her granddaughter to talk with the MD's due to she speaks english Premorbid pt/family roles/activities:  mm, grandmother, sibling, etc Anticipated changes in roles/activities/participation: resume Pt/family expectations/goals: Pt states": " I hope to do well and recover from this."  [employer]: None Premorbid Home Care/DME Agencies: None Transportation available at discharge: family Is the patient able to respond to transportation needs?: Yes In the past 12 months, has lack of transportation kept you from medical appointments or from getting medications?: No In the past 12 months, has lack of transportation kept you from meetings, work, or from getting things needed for daily living?: No Resource referrals recommended: Neuropsychology  Discharge Planning Living Arrangements: Children, Other relatives Support Systems: Children, Other relatives, Friends/neighbors Type of Residence: Private residence Insurance Resources: Media planner (specify) Radiographer, therapeutic) Financial Resources: Tree surgeon, Family Support Financial Screen Referred: No Living Expenses: Lives with family Money Management: Family Does the patient have any problems obtaining your medications?: No Home Management: granddaughter Patient/Family Preliminary Plans: Return home with granddaughter who can be there with her and proivide assist. Awaiting return call from granddaughter to confirm this. Aware being evaluated today and goals being set for stay here. Care Coordinator Barriers to Discharge: Insurance for SNF coverage, Weight bearing restrictions, Wound Care, IV antibiotics Care Coordinator Anticipated Follow Up Needs: HH/OP  Clinical Impression Pleasant female who is not feeling well today but trying in therapies. Her family is involved and supportive. Awaiting granddaughter call back to confirm plan.  Mardell Shade 06/08/2023, 11:25 AM

## 2023-06-08 NOTE — Evaluation (Signed)
 Physical Therapy Assessment and Plan  Patient Details  Name: Katherine Archer MRN: [id number] Date of Birth: 01-18-1955  PT Diagnosis: Abnormal posture, Abnormality of gait, Difficulty walking, Edema, Impaired sensation, Muscle weakness, and Pain in residual limb Rehab Potential: Good ELOS: 7-10 days   Today's Date: 06/08/2023 PT Individual Time: 0900-1013, 7829-5621 PT Individual Time Calculation (min): 73 min, 57 min   Hospital Problem: Principal Problem:   Right above-knee amputee [facility])   Past Medical History:  Past Medical History:  Diagnosis Date   Cervical polyp 09/18/2018   Critical lower limb ischemia (HCC)    Encounter for screening mammogram for breast cancer 05/05/2016   Fibrocystic breast changes of both breasts 04/29/2015   Hyperlipidemia    Hypertension    PAD (peripheral artery disease) (HCC) 08/23/2019   Stroke (HCC) 08/23/2019   Past Surgical History:  Past Surgical History:  Procedure Laterality Date   ABDOMINAL AORTOGRAM W/LOWER EXTREMITY Bilateral 08/23/2019   Procedure: ABDOMINAL AORTOGRAM W/ Bilateral LOWER EXTREMITY [place];  Surgeon: Richrd Char, MD;  Location: [facility] INVASIVE CV LAB;  Service: Cardiovascular;  Laterality: Bilateral;   AMPUTATION Right 05/31/2023   Procedure: AMPUTATION, ABOVE KNEE SARTORIOUS MUSCLE FLAP;  Surgeon: Margherita Shell, MD;  Location: [facility] OR;  Service: Vascular;  Laterality: Right;   APPLICATION OF WOUND VAC Right 05/31/2023   Procedure: APPLICATION, WOUND VAC;  Surgeon: Margherita Shell, MD;  Location: [facility] OR;  Service: Vascular;  Laterality: Right;   APPLICATION, SKIN SUBSTITUTE Right 05/12/2023   Procedure: APPLICATION, SKIN SUBSTITUTE KERECIS 38SQ CM;  Surgeon: Margherita Shell, MD;  Location: [facility] OR;  Service: Vascular;  Laterality: Right;   AVGG REMOVAL Right 05/29/2023   Procedure: REMOVAL BYPASS GRAFT RIGHT LEG;  Surgeon: Kayla Part, MD;  Location: [facility] OR;  Service: Vascular;  Laterality: Right;   AXILLARY-FEMORAL BYPASS GRAFT  Right 11/11/2019   Procedure: BYPASS GRAFT RIGHT AXILLA-BIFEMORAL;  Surgeon: Richrd Char, MD;  Location: [facility] OR;  Service: Vascular;  Laterality: Right;   BRAIN SURGERY     BREAST SURGERY Bilateral 2014   1980 R breast surgery   CEREBRAL ANEURYSM REPAIR     ENDARTERECTOMY FEMORAL Bilateral 11/11/2019   Procedure: ENDARTERECTOMY FEMORAL BILATERALLY;  Surgeon: Richrd Char, MD;  Location: [facility] OR;  Service: Vascular;  Laterality: Bilateral;   ENDARTERECTOMY FEMORAL Right 01/26/2022   Procedure: REDO RIGHT COMMON FEMORAL ENDARTERECTOMY USING 0.8CM X 7.6CM HEMASHIELD PLATINUM FINESSE PATCH;  Surgeon: Margherita Shell, MD;  Location: [facility] OR;  Service: Vascular;  Laterality: Right;   FEMORAL-POPLITEAL BYPASS GRAFT Right 01/26/2022   Procedure: RIGHT FEMORAL-POPLITEAL BYPASS GRAFT USING GORE X 80CM PROPATEN GRAFT;  Surgeon: Margherita Shell, MD;  [facility]: [facility] OR;  Service: Vascular;  Laterality: Right;   FEMORAL-POPLITEAL BYPASS GRAFT Right 05/12/2023   Procedure: BYPASS GRAFT FEMORAL-POPLITEAL ARTERY;  Surgeon: Margherita Shell, MD;  [facility]: [facility];  Service: Vascular;  Laterality: Right;   INCISION AND DRAINAGE OF WOUND Right 05/31/2023   Procedure: IRRIGATION AND DEBRIDEMENT WOUND;  Surgeon: Margherita Shell, MD;  Location: [facility] OR;  Service: Vascular;  Laterality: Right;  RIGHT GROIN WASHOUT   IR RADIOLOGIST EVAL & MGMT  04/02/2020   IR RADIOLOGIST EVAL & MGMT  03/29/2021   THROMBECTOMY FEMORAL ARTERY Right 01/26/2022   Procedure: THROMBECTOMY RIGHT FEMORAL ARTERY;  Surgeon: Margherita Shell, MD;  [facility]: [facility];  Service: Vascular;  Laterality: Right;   THROMBECTOMY OF BYPASS GRAFT FEMORAL- TIBIAL ARTERY Right 05/12/2023   Procedure: THROMBECTOMY, BYPASS  GRAFT, ARTERIAL;  Surgeon: Margherita Shell, MD;  Location: [facility] OR;  Service: Vascular;  Laterality: Right;   TUBAL LIGATION  1990    Assessment & Plan Clinical Impression: Patient is a 68 y.o. year old female with history of hypertension, CVA,  hyperlipidemia, quit smoking 3 years ago, PAD maintained on Eliquis  with multiple revascularizations including s/p right femoral-popliteal BGD 05/12/2023 per Dr. Charlotte Cookey and discharged home 05/15/2023.  Per chart review patient lives with daughter and grandson.  1 level mobile home home 5 steps to entry.  Alternates between using a straight point cane and rolling walker for mobility.  Reportedly independent with ADLs.  Presented 05/28/2023 with chills/nausea with wound dehiscence/suspect sepsis with WBC 31,000 and underwent reexposure of right common femoral artery/popliteal artery with ligation of right axillobifemoral bypass graft excision of right common femoral artery excision of right femoral to above-knee popliteal artery bypass with above-knee popliteal artery ligation right sided greater saphenous vein saphenectomy as well as excision of right femoral to below-knee popliteal artery bypass 05/29/2023 per Dr. Rozanna Corner.  Wound VAC was placed to right groin.  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 Charlotte Cookey..  Hospital course acute blood loss anemia 8.9.  She was cleared to begin subcutaneous heparin  for DVT prophylaxis..  Patient initially placed on daptomycin  for wound coverage and plan to transition to doxycycline however patient with noted elevated CK to 3217 felt to be secondary to daptomycin  with infectious disease Dr. Artemio Larry consulted antibiotic transition to vancomycin  for now.  Therapy evaluations completed due to patient's decreased functional mobility was admitted for a comprehensive rehab program. Complains of right lower extremity residual limb pain but appears to be well controlled at this time.   Patient currently requires CGA/min A with mobility secondary to muscle weakness and muscle joint tightness, decreased cardiorespiratoy endurance, and decreased sitting balance, decreased standing balance, decreased balance strategies, and  difficulty maintaining precautions.  Prior to hospitalization, patient was modified independent  with mobility and lived with Family (plans to discharge to granddaughters home) in a Mobile home home.  Home access is 5Ramped entrance, Stairs to enter (pt reports plan to install ramp).  Patient will benefit from skilled PT intervention to maximize safe functional mobility, minimize fall risk, and decrease caregiver burden for planned discharge home with intermittent assist.  Anticipate patient will benefit from follow up OP at discharge.  PT - End of Session Activity Tolerance: Tolerates 30+ min activity with multiple rests Endurance Deficit: Yes PT Assessment Rehab Potential (ACUTE/IP ONLY): Good PT Barriers to Discharge: Inaccessible home environment;Home environment access/layout;IV antibiotics;Wound Care;Weight bearing restrictions PT Patient demonstrates impairments in the following area(s): Balance;Behavior;Edema;Endurance;Motor;Pain;Safety;Sensory;Skin Integrity PT Transfers Functional Problem(s): Bed Mobility;Bed to Chair;Car PT Locomotion Functional Problem(s): Ambulation;Wheelchair Mobility PT Plan PT Intensity: Minimum of 1-2 x/day ,45 to 90 minutes PT Frequency: 5 out of 7 days PT Duration Estimated Length of Stay: 7-10 days PT Treatment/Interventions: Ambulation/gait training;Community reintegration;DME/adaptive equipment instruction;Neuromuscular re-education;Psychosocial support;Stair training;UE/LE Strength taining/ROM;Wheelchair propulsion/positioning;Balance/vestibular training;Discharge planning;Pain management;Skin care/wound management;Therapeutic Activities;UE/LE Coordination activities;Cognitive remediation/compensation;Disease management/prevention;Functional mobility training;Patient/family education;Splinting/orthotics;Therapeutic Exercise;Visual/perceptual remediation/compensation PT Transfers Anticipated Outcome(s): mod I PT Locomotion Anticipated Outcome(s):  supervision PT Recommendation Recommendations for Other Services: Neuropsych consult;Therapeutic Recreation consult Therapeutic Recreation Interventions: Pet therapy;Stress management Follow Up Recommendations: Home health PT;Outpatient PT Patient destination: Home Equipment Recommended: To be determined Equipment Details: pt has RW, pt needs WC   PT Evaluation Precautions/Restrictions Precautions Precautions: Fall Recall of Precautions/Restrictions: Intact Precaution/Restrictions Comments: R AKA, wound vac,  RUE PICC-IV antibiotics Restrictions Weight Bearing Restrictions Per Provider Order: Yes RLE Weight Bearing Per Provider Order: Non weight bearing Pain Interference Pain Interference Pain Effect on Sleep: 1. Rarely or not at all Pain Interference with Therapy Activities: 2. Occasionally Pain Interference with Day-to-Day Activities: 2. Occasionally Home Living/Prior Functioning Home Living Living Arrangements: Children;Other relatives Available Help at Discharge: Family;Available 24 hours/day (granddaughter works from home) Type of Home: Mobile home Home Access: Ramped entrance;Stairs to enter (pt reports plan to install ramp) Entrance Stairs-Number of Steps: 5 Entrance Stairs-Rails: Right;Left;Can reach both Home Layout: One level Bathroom Shower/Tub: Other (comment) (pt unaware of bathroom set up) Bathroom Toilet: Standard Bathroom Accessibility: Yes  Lives With: Family (plans to discharge to granddaughters home) Prior Function Level of Independence: Independent with basic ADLs;Independent with homemaking with ambulation;Independent with gait;Independent with transfers;Requires assistive device for independence (intermediate use of SPC)  Able to Take Stairs?: Yes Driving: No (pt reports she stopped driving in 7829) Vision/Perception  Vision - History Ability to See in Adequate Light: 0 Adequate Perception Perception: Within Functional Limits Praxis Praxis: WFL   Cognition Overall Cognitive Status: Within Functional Limits for tasks assessed Arousal/Alertness: Awake/alert Orientation Level: Oriented X4 Memory: Appears intact Awareness: Appears intact Problem Solving: Appears intact Safety/Judgment: Appears intact Sensation Sensation Light Touch: Impaired Detail Peripheral sensation comments: LLE numbness, 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 Light Touch Impaired Details: Impaired RUE;Impaired LLE Hot/Cold: Appears Intact Proprioception: Impaired by gross assessment Coordination Gross Motor Movements are Fluid and Coordinated: No Fine Motor Movements are Fluid and Coordinated: Yes Coordination and Movement Description: Deficits due to R AKA. Motor  Motor Motor: Within Functional Limits  Trunk/Postural Assessment  Cervical Assessment Cervical Assessment: Exceptions to [facility] (forward head) Thoracic Assessment Thoracic Assessment: Exceptions to [facility] (rounded shoulders) Lumbar Assessment Lumbar Assessment: Exceptions to [facility] (posterior pelvic tilt) Postural Control Postural Control: Within Functional Limits  Balance Balance Balance Assessed: Yes Static Sitting Balance Static Sitting - Balance Support: Feet supported Static Sitting - Level of Assistance: 7: Independent Dynamic Sitting Balance Dynamic Sitting - Balance Support: During functional activity Dynamic Sitting - Level of Assistance: 6: Modified independent (Device/Increase time) Static Standing Balance Static Standing - Balance Support: Bilateral upper extremity supported Static Standing - Level of Assistance: 5: Stand by assistance (supervision/CGA) Dynamic Standing Balance Dynamic Standing - Balance Support: During functional activity Dynamic Standing - Level of Assistance: 5: Stand by assistance (CGA) Extremity Assessment  RUE Assessment RUE Assessment: Within Functional Limits LUE Assessment LUE Assessment: Exceptions to [facility] Active Range of Motion (AROM)  Comments: WFL General Strength Comments: 3+/5 RLE Assessment RLE Assessment: Exceptions to [facility] General Strength Comments: grossly 3/5 LLE Assessment LLE Assessment: Within Functional Limits  Care Tool Care Tool Bed Mobility Roll left and right activity   Roll left and right assist level: Minimal Assistance - Patient > 75%    Sit to lying activity   Sit to lying assist level: Supervision/Verbal cueing    Lying to sitting on side of bed activity   Lying to sitting on side of bed assist level: the ability to move from lying on the back to sitting on the side of the bed with no back support.: Supervision/Verbal cueing     Care Tool Transfers Sit to stand transfer   Sit to stand assist level: Contact Guard/Touching assist    Chair/bed transfer   Chair/bed transfer assist level: Contact Guard/Touching assist    Car transfer Car transfer activity did not occur: Safety/medical concerns (pain/fatigue/lightheadedness)  Care Tool Locomotion Ambulation   Assist level: Contact Guard/Touching assist Assistive device: Walker-rolling Max distance: 15  Walk 10 feet activity   Assist level: Contact Guard/Touching assist Assistive device: Walker-rolling   Walk 50 feet with 2 turns activity Walk 50 feet with 2 turns activity did not occur: Safety/medical concerns (pain/fatigue/lightheadedness)      Walk 150 feet activity Walk 150 feet activity did not occur: Safety/medical concerns      Walk 10 feet on uneven surfaces activity Walk 10 feet on uneven surfaces activity did not occur: Safety/medical concerns      Stairs Stair activity did not occur: Safety/medical concerns        Walk up/down 1 step activity Walk up/down 1 step or curb (drop down) activity did not occur: Safety/medical concerns      Walk up/down 4 steps activity Walk up/down 4 steps activity did not occur: Safety/medical concerns      Walk up/down 12 steps activity Walk up/down 12 steps activity did not  occur: Safety/medical concerns      Pick up small objects from floor   Pick up small object from the floor assist level: Total Assistance - Patient < 25%    Wheelchair Is the patient using a wheelchair?: Yes Type of Wheelchair: Manual   Wheelchair assist level: Supervision/Verbal cueing Max wheelchair distance: 150  Wheel 50 feet with 2 turns activity   Assist Level: Supervision/Verbal cueing  Wheel 150 feet activity   Assist Level: Supervision/Verbal cueing    Refer to Care Plan for Long Term Goals  SHORT TERM GOAL WEEK 1 PT Short Term Goal 1 (Week 1): STG=LTG 2/2 ELOS  Recommendations for other services: Neuropsych and Therapeutic Recreation  Pet therapy and Stress management  Skilled Therapeutic Intervention Mobility Bed Mobility Bed Mobility: Rolling Right;Rolling Left;Supine to Sit;Sit to Supine Rolling Right: Minimal Assistance - Patient > 75% Rolling Left: Minimal Assistance - Patient > 75% Supine to Sit: Supervision/Verbal cueing Sit to Supine: Supervision/Verbal cueing Transfers Transfers: Stand to Sit;Sit to Stand;Stand Pivot Transfers Sit to Stand: Contact Guard/Touching assist Stand to Sit: Contact Guard/Touching assist Stand Pivot Transfers: Contact Guard/Touching assist Transfer (Assistive device): Rolling walker Locomotion  Gait Ambulation: Yes Gait Assistance: Contact Guard/Touching assist Gait Distance (Feet): 15 Feet Assistive device: Rolling walker Gait Gait: Yes Gait Pattern: Impaired (hop to) Gait velocity: decreased Stairs / Additional Locomotion Stairs: No (pain/fatigue/lightheadedness) Naval architect Mobility: Yes Wheelchair Assistance: Doctor, general practice: Both upper extremities Wheelchair Parts Management: Needs assistance Distance: 150   Discharge Criteria: Patient will be discharged from PT if patient refuses treatment 3 consecutive times without medical reason, if treatment goals not  met, if there is a change in medical status, if patient makes no progress towards goals or if patient is discharged from hospital.  The above assessment, treatment plan, treatment alternatives and goals were discussed and mutually agreed upon: by patient  Today's Interventions  Pt supine in bed upon arrival. Interpretor present to translate throughout session. Pt agreeable to therapy but requesting urgent need to use bathroom. PT managing wound vac throughout session.   Evaluation completed (see details above and below) with education on PT POC and goals and individual treatment initiated with focus on transfer training.   Bed mobility: rolling to R and L with min A (with use of bedrails) to donn AKA ace wrap. Supine to sit, sit to supine with supervision and increased time.   Transfers: sit to stand, stand pivot transfer with RW and CGA, verbal cues  provided for technique with emphasis on R LE NWB  Gait: gait x15 feet BSC over toilet to bed with RW and CGA, verbal cues provided for safety with RW.   WC mobility: provided pt with 16x16 WC with cushion,  pt self propelled WC 150 feet with B UE and supervision.   Began to trial car transfer however discontinued 2/2 increased pain and lightheadedness. Opted to return to bed for safety. Pt supine in bed at end of session with all needs within reach and bed alarm on with nurse performing assessment.   Treatment Session 2   Pt seated in recliner upon arrival. Pt agreeable to therapy. Pt denies any pain. Interpretor present throughout session.   PT managing wound vac and IV throughout session   Pt requesting to use bathroom. Pt performed stand pivot transfer recliner to [facility] with RW and CGA. Pt continent of bladder. Pt performed pericare while seated with supervision. Pt donned pants while standing with CGA.   AKA shrinkers (size small and medium) delivered during session. Donned size medium--will progress towards size small.   Doffed ace wrap  and donned AKA shrink with increased time to navigate wound vac tubing.   Pt performed single leg glute bridge 2x5 holding for 5 seconds, verbal cues provided for technique.   Pt supine in bed with all needs within reach and bed alarm on.      [facility], [place], DPT  06/08/2023, 12:25 PM

## 2023-06-08 NOTE — Progress Notes (Addendum)
 PROGRESS NOTE   Subjective/Complaints: Pt with right stump pain. Trying to have bm   ROS: Patient denies fever, rash, sore throat, blurred vision, dizziness, nausea, vomiting, diarrhea, cough, shortness of breath or chest pain,    headache, or mood change.    Objective:   No results found. Recent Labs    06/07/23 2054 06/08/23 0509  WBC 15.8* 14.2*  HGB 8.5* 8.6*  HCT 27.1* 28.3*  PLT 301 315   Recent Labs    06/07/23 0500 06/07/23 2054 06/08/23 0509  NA 132*  --  134*  K 4.0  --  3.8  CL 99  --  101  CO2 26  --  27  GLUCOSE 98  --  116*  BUN 11  --  12  CREATININE 0.53 0.72 0.55  CALCIUM  8.7*  --  8.7*    Intake/Output Summary (Last 24 hours) at 06/08/2023 0859 Last data filed at 06/08/2023 0700 Gross per 24 hour  Intake 237 ml  Output --  Net 237 ml        Physical Exam: Vital Signs Blood pressure (!) 149/39, pulse 74, temperature 98.1 F (36.7 C), temperature source Oral, resp. rate 16, height 5\' 6"  (1.676 m), weight 65.8 kg, SpO2 100%.  General: Alert and oriented x 3, No apparent distress HEENT: Head is normocephalic, atraumatic, PERRLA, EOMI, sclera anicteric, oral mucosa pink and moist, dentition intact, ext ear canals clear,  Neck: Supple without JVD or lymphadenopathy Heart: Reg rate and rhythm. No murmurs rubs or gallops Chest: CTA bilaterally without wheezes, rales, or rhonchi; no distress Abdomen: Soft, non-tender, non-distended, bowel sounds positive. Extremities: No clubbing, cyanosis, or edema. Pulses are 2+ Psych: Pt's affect is appropriate. Pt is cooperative Skin: Right groin wound with vac. Right AKA, medial thigh incisions well approximated with staples.  mild s/s drainage, ecchymoses and blistering medially around incision and proximally on thigh Neuro:  Alert and oriented x 3. Normal insight and awareness. Intact Memory. Normal language and speech. Cranial nerve exam unremarkable.  MMT: 5/5 BUE. RLE limited by pain but at least 3/5 with HF, LLE 4-5/5. Prox to distal. .   Musculoskeletal: right AK sensitive to touch.  swollen   Assessment/Plan: 1. Functional deficits which require 3+ hours per day of interdisciplinary therapy in a comprehensive inpatient rehab setting. Physiatrist is providing close team supervision and 24 hour management of active medical problems listed below. Physiatrist and rehab team continue to assess barriers to discharge/monitor patient progress toward functional and medical goals  Care Tool:  Bathing              Bathing assist       Upper Body Dressing/Undressing Upper body dressing        Upper body assist      Lower Body Dressing/Undressing Lower body dressing            Lower body assist       Toileting Toileting    Toileting assist Assist for toileting: Moderate Assistance - Patient 50 - 74%     Transfers Chair/bed transfer  Transfers assist  Chair/bed transfer activity did not occur: Safety/medical concerns  Locomotion Ambulation   Ambulation assist              Walk 10 feet activity   Assist           Walk 50 feet activity   Assist           Walk 150 feet activity   Assist           Walk 10 feet on uneven surface  activity   Assist           Wheelchair     Assist               Wheelchair 50 feet with 2 turns activity    Assist            Wheelchair 150 feet activity     Assist          Blood pressure (!) 149/39, pulse 74, temperature 98.1 F (36.7 C), temperature source Oral, resp. rate 16, height 5\' 6"  (1.676 m), weight 65.8 kg, SpO2 100%.  Medical Problem List and Plan: 1. Functional deficits secondary to right AKA  and I&D groin with sartorius muscle flap 05/31/2023 after failed revascularization procedure             -patient may not shower while wound vac is in place             -ELOS/Goals: 8-11 days S              -Patient is beginning CIR therapies today including PT and OT     2.  Antithrombotics: -DVT/anticoagulation:  Pharmaceutical: Heparin              -antiplatelet therapy: continue Aspirin  81 mg daily   3. Pain Management: continue Neurontin  300 mg twice daily, oxycodone  as needed   4. Mood/Behavior/Sleep: Provide emotional support             -antipsychotic agents: N/A   5. Neuropsych/cognition: This patient is capable of making decisions on her own behalf.   6. Skin/Wound Care:  6/5-Wound VAC right groin--per surgery change on Monday by WOC RN.        -continue dry dressing to AKA incision, monitor blistering   7. Fluids/Electrolytes/Nutrition: Routine follow-up and outs follow-up chemistries   6/5-mild hyponatremia- 134 (sl improvement), continue to monitor  -albumin  only 1.9--->protein supps daily 8.  Postoperative anemia.  Follow-up CBC   9.ID/Sepsis.  Daptomycin  changed to vancomycin  due to elevated CK.  Follow-up infectious disease.  Await long-term plan of care on antibiotic   6/5- wbcs fairly stable at 14k       -CK ordered for Friday 10.  Hypertension.  BP reviewed: currently systolic is elevated and diastolic is soft: Continue Avapro  75 mg daily, Norvasc  10 mg daily, hydralazine  25 mg every 8 hours, HCTZ 12.5 mg daily.  Monitor with increased mobility   6/5 controlled at present 11.  Hyperlipidemia.  Will discuss holding Crestor  with pharmacy due to elevated CK.   12. Hyperglycemia: hemoglobin A1c reviewed and is lower than normal, continue to monitor 13. ABLA:  -hgb 8.6. may have reached nadir--recheck Monday   -Fe++ supp 14. Slow transit constipation?  6/5-?BM on 6/3. pt seems to be struggling to have BM this morning  -f/u with nursing  -add daily miralax  and senna at night     LOS: 1 days A FACE TO FACE EVALUATION WAS PERFORMED  Rawland Caddy 06/08/2023, 8:59 AM

## 2023-06-08 NOTE — Plan of Care (Signed)
  Problem: RH Balance Goal: LTG Patient will maintain dynamic standing with ADLs (OT) Description: LTG:  Patient will maintain dynamic standing balance with assist during activities of daily living (OT)  Flowsheets (Taken 06/08/2023 1233) LTG: Pt will maintain dynamic standing balance during ADLs with: Independent with assistive device   Problem: RH Bathing Goal: LTG Patient will bathe all body parts with assist levels (OT) Description: LTG: Patient will bathe all body parts with assist levels (OT) Flowsheets (Taken 06/08/2023 1233) LTG: Pt will perform bathing with assistance level/cueing: Independent with assistive device    Problem: RH Dressing Goal: LTG Patient will perform lower body dressing w/assist (OT) Description: LTG: Patient will perform lower body dressing with assist, with/without cues in positioning using equipment (OT) Flowsheets (Taken 06/08/2023 1233) LTG: Pt will perform lower body dressing with assistance level of: Independent with assistive device   Problem: RH Toileting Goal: LTG Patient will perform toileting task (3/3 steps) with assistance level (OT) Description: LTG: Patient will perform toileting task (3/3 steps) with assistance level (OT)  Flowsheets (Taken 06/08/2023 1233) LTG: Pt will perform toileting task (3/3 steps) with assistance level: Independent with assistive device   Problem: RH Simple Meal Prep Goal: LTG Patient will perform simple meal prep w/assist (OT) Description: LTG: Patient will perform simple meal prep with assistance, with/without cues (OT). Flowsheets (Taken 06/08/2023 1233) LTG: Pt will perform simple meal prep with assistance level of: Independent with assistive device   Problem: RH Light Housekeeping Goal: LTG Patient will perform light housekeeping w/assist (OT) Description: LTG: Patient will perform light housekeeping with assistance, with/without cues (OT). Flowsheets (Taken 06/08/2023 1233) LTG: Pt will perform light housekeeping with  assistance level of: Independent with assistive device   Problem: RH Toilet Transfers Goal: LTG Patient will perform toilet transfers w/assist (OT) Description: LTG: Patient will perform toilet transfers with assist, with/without cues using equipment (OT) Flowsheets (Taken 06/08/2023 1233) LTG: Pt will perform toilet transfers with assistance level of: Independent with assistive device   Problem: RH Tub/Shower Transfers Goal: LTG Patient will perform tub/shower transfers w/assist (OT) Description: LTG: Patient will perform tub/shower transfers with assist, with/without cues using equipment (OT) Flowsheets (Taken 06/08/2023 1233) LTG: Pt will perform tub/shower stall transfers with assistance level of: Supervision/Verbal cueing

## 2023-06-08 NOTE — Progress Notes (Signed)
 Inpatient Rehabilitation  Patient information reviewed and entered into eRehab system by Feliberto Gottron, M.A., CCC-SLP, Rehab Quality Coordinator.  Information including medical coding, functional ability and quality indicators will be reviewed and updated through discharge.

## 2023-06-08 NOTE — Progress Notes (Signed)
 Inpatient Rehabilitation Admission Medication Review by a Pharmacist  A complete drug regimen review was completed for this patient to identify any potential clinically significant medication issues.  High Risk Drug Classes Is patient taking? Indication by Medication  Antipsychotic No   Anticoagulant Yes Heparin  SQ -VTE prophylaxis  Antibiotic Yes, as an intravenous medication Vancomycin  -bacteremia/ graft infection   Opioid Yes Oxycodone  -pain management   Antiplatelet Yes Aspirin - PAD, limb ischemia  Hypoglycemics/insulin  No   Vasoactive Medication Yes Amlodipine ,Irbesartan , hydralazine , hydrochlorothiazide  - HTN  Chemotherapy No   Other Yes APAP -pain management  Ferrous sulfate- anemia Gabapentin  -neuropathic pain Zetia  - HLD     Type of Medication Issue Identified Description of Issue Recommendation(s)  Drug Interaction(s) (clinically significant)     Duplicate Therapy     Allergy     No Medication Administration End Date     Incorrect Dose     Additional Drug Therapy Needed     Significant med changes from prior encounter (inform family/care partners about these prior to discharge).    Other       Clinically significant medication issues were identified that warrant physician communication and completion of prescribed/recommended actions by midnight of the next day:  Yes  On [facility] acute hospitalization , Rosuvastatin  (crestor ) held due to CK trending up ---CK 2112> 3217.  --- previous daptomycin  DC'd  and holding Crestor  until Friday per Dr. Darlyn Eke Plan to repeat CK 6/6 Friday Current order in for Crestor  to resume on 06/09/22 Friday at 0800;   Recommend to discontinue Crestor  until after CK result on 6/6 AM evaluated and trending down <1000.   Name of provider notified for urgent issues identified:    Dr. Rayleen Cal, and Troy Furnish, PA   Provider Method of Notification:  EPIC chat messaging   Pharmacist comments:   Troy Furnish, PA ordered to discontinue  Crestor  for now , until CK result evauated.  ID pharmacist said usually hold statin until CK reduced to <1000.  Time spent performing this drug regimen review (minutes):  25   Alisa Irish, [place] Clinical Pharmacist 06/08/2023 2:22 PM

## 2023-06-09 DIAGNOSIS — E1159 Type 2 diabetes mellitus with other circulatory complications: Secondary | ICD-10-CM

## 2023-06-09 DIAGNOSIS — Z794 Long term (current) use of insulin: Secondary | ICD-10-CM

## 2023-06-09 DIAGNOSIS — Z89611 Acquired absence of right leg above knee: Secondary | ICD-10-CM | POA: Diagnosis not present

## 2023-06-09 DIAGNOSIS — I10 Essential (primary) hypertension: Secondary | ICD-10-CM | POA: Diagnosis not present

## 2023-06-09 LAB — GLUCOSE, CAPILLARY
Glucose-Capillary: 99 mg/dL (ref 70–99)
Glucose-Capillary: 110 mg/dL — ABNORMAL HIGH (ref 70–99)

## 2023-06-09 LAB — CK: Total CK: 1049 U/L — ABNORMAL HIGH (ref 38–234)

## 2023-06-09 NOTE — Progress Notes (Signed)
 Physical Therapy Group Session Note  Patient Details  Name: Katherine Archer MRN: [id number] Date of Birth: 07/24/1955  Today's Date: 06/09/2023 PT Missed Time: 60 Minutes Missed Time Reason: Nursing care  Skilled Therapeutic Interventions/Progress Updates:   Pt missed amputee group session this afternoon due to dressing change/wound vac care. Will attempt to make up missed time as pt becomes available. 60 minutes missed of skilled therapy services.  Therapy Documentation Precautions:  Precautions Precautions: Fall Recall of Precautions/Restrictions: Intact Precaution/Restrictions Comments: R AKA, wound vac, RUE PICC-IV antibiotics Restrictions Weight Bearing Restrictions Per Provider Order: Yes RLE Weight Bearing Per Provider Order: Non weight bearing   Therapy/Group: Group Therapy Nicolas Barren Zaunegger Nena Bank PT, DPT 06/09/2023, 11:37 AM

## 2023-06-09 NOTE — Consult Note (Signed)
 WOC Nurse wound follow up Reason for Consult: Alarming machine (blockage). Notice a poor pressure on the wound dressing 25 mmHg up to 50 mmHg. Decided to change the peel and place dressing to groin. Wound type: surgical Pressure Injury POA: NA Measurement: 14 cm x 3,5 cm x 0.5 cm. Tunneling on 12 o'clock with 4 cm.  Wound bed: 60% yellow slough, 40% red and Muscle exposition.  Drainage (amount, consistency, odor) Moderate amount, 300 ml serosanguinous in cannister at 1300. Changed Canister. Periwound: intact Dressing procedure/placement/frequency: Removed old NPWT dressing Cleansed wound with normal saline Periwound skin protected with skin barrier wipe or window framed with drape Skin protected to the hip with VAC drape for foam bridge  Filled wound with 1 piece of black foam  Sealed NPWT dressing at HG/142mmHG  Patient didn't received IV/PO pain medication per bedside nurse prior to dressing change Patient tolerated procedure well  WOC nurse will continue to provide NPWT dressing changed due to the complexity of the dressing change.   WOC team will follow on FRI.   Please reconsult if further assistance is needed. Thank-you,  Rachel Budds BSN, RN, [employer], WOC  (Pager: [phone])

## 2023-06-09 NOTE — Progress Notes (Signed)
 Met with patient to review current situation, team conference and plan of care. Reviewed diet, medications, incision care. Patient understood some. Will follow up when family in room. Noted blister unopened on incision site. Continue to follow along to provide educational needs to facilitate preparation for discharge.

## 2023-06-09 NOTE — Progress Notes (Signed)
 PROGRESS NOTE   Subjective/Complaints: Pt with right stump pain. Occ phantom sensation but no phantom pain.     ROS: Patient denies fever, rash, sore throat, blurred vision, dizziness, nausea, vomiting, diarrhea, cough, shortness of breath or chest pain,    headache, or mood change.    Objective:   No results found. Recent Labs    06/07/23 2054 06/08/23 0509  WBC 15.8* 14.2*  HGB 8.5* 8.6*  HCT 27.1* 28.3*  PLT 301 315   Recent Labs    06/07/23 0500 06/07/23 2054 06/08/23 0509  NA 132*  --  134*  K 4.0  --  3.8  CL 99  --  101  CO2 26  --  27  GLUCOSE 98  --  116*  BUN 11  --  12  CREATININE 0.53 0.72 0.55  CALCIUM  8.7*  --  8.7*   No intake or output data in the 24 hours ending 06/09/23 0728       Physical Exam: Vital Signs Blood pressure (!) 120/53, pulse (!) 59, temperature 98.3 F (36.8 C), resp. rate 17, height 5\' 6"  (1.676 m), weight 67.9 kg, SpO2 100%.   General: No acute distress Mood and affect are appropriate Heart: Regular rate and rhythm no rubs murmurs or extra sounds Lungs: Clear to auscultation, breathing unlabored, no rales or wheezes Abdomen: Positive bowel sounds, soft nontender to palpation, nondistended Extremities: BUE and LLENo clubbing, cyanosis, or edema  Psych: Pt's affect is appropriate. Pt is cooperative Skin: Right groin wound with vac. Right AKA, medial thigh incisions well approximated with staples.  mild s/s drainage, ecchymoses and blistering medially around incision and proximally on thigh Left foot with plantar callus , mildly tender Neuro:  Alert and oriented x 3. Normal insight and awareness. Intact Memory. Normal language and speech. Cranial nerve exam unremarkable. MMT: 5/5 BUE. Musculoskeletal: right AK sensitive to touch.  swollen   Assessment/Plan: 1. Functional deficits which require 3+ hours per day of interdisciplinary therapy in a comprehensive inpatient  rehab setting. Physiatrist is providing close team supervision and 24 hour management of active medical problems listed below. Physiatrist and rehab team continue to assess barriers to discharge/monitor patient progress toward functional and medical goals  Care Tool:  Bathing    Body parts bathed by patient: Right arm, Left arm, Chest, Abdomen, Front perineal area, Buttocks, Right upper leg, Left upper leg, Left lower leg, Face     Body parts n/a: Right lower leg   Bathing assist Assist Level: Contact Guard/Touching assist     Upper Body Dressing/Undressing Upper body dressing   What is the patient wearing?: Pull over shirt    Upper body assist Assist Level: Set up assist    Lower Body Dressing/Undressing Lower body dressing      What is the patient wearing?: Pants     Lower body assist Assist for lower body dressing: Contact Guard/Touching assist     Toileting Toileting    Toileting assist Assist for toileting: Contact Guard/Touching assist     Transfers Chair/bed transfer  Transfers assist  Chair/bed transfer activity did not occur: Safety/medical concerns  Chair/bed transfer assist level: Contact Guard/Touching assist  Locomotion Ambulation   Ambulation assist      Assist level: Contact Guard/Touching assist Assistive device: Walker-rolling Max distance: 15   Walk 10 feet activity   Assist     Assist level: Contact Guard/Touching assist Assistive device: Walker-rolling   Walk 50 feet activity   Assist Walk 50 feet with 2 turns activity did not occur: Safety/medical concerns (pain/fatigue/lightheadedness)         Walk 150 feet activity   Assist Walk 150 feet activity did not occur: Safety/medical concerns         Walk 10 feet on uneven surface  activity   Assist Walk 10 feet on uneven surfaces activity did not occur: Safety/medical concerns         Wheelchair     Assist Is the patient using a wheelchair?:  Yes Type of Wheelchair: Manual    Wheelchair assist level: Supervision/Verbal cueing Max wheelchair distance: 150    Wheelchair 50 feet with 2 turns activity    Assist        Assist Level: Supervision/Verbal cueing   Wheelchair 150 feet activity     Assist      Assist Level: Supervision/Verbal cueing   Blood pressure (!) 120/53, pulse (!) 59, temperature 98.3 F (36.8 C), resp. rate 17, height 5\' 6"  (1.676 m), weight 67.9 kg, SpO2 100%.  Medical Problem List and Plan: 1. Functional deficits secondary to right AKA  and I&D groin with sartorius muscle flap 05/31/2023 after failed revascularization procedure             -patient may not shower while wound vac is in place             -ELOS/Goals: 8-11 days S             -Patient is beginning CIR therapies today including PT and OT     2.  Antithrombotics: -DVT/anticoagulation:  Pharmaceutical: Heparin              -antiplatelet therapy: continue Aspirin  81 mg daily   3. Pain Management: continue Neurontin  300 mg twice daily, oxycodone  as needed   4. Mood/Behavior/Sleep: Provide emotional support             -antipsychotic agents: N/A   5. Neuropsych/cognition: This patient is capable of making decisions on her own behalf.   6. Skin/Wound Care:  6/5-Wound VAC right groin--per surgery change on Monday by WOC RN.        -continue dry dressing to AKA incision, monitor blistering   7. Fluids/Electrolytes/Nutrition: Routine follow-up and outs follow-up chemistries   6/5-mild hyponatremia- 134 (sl improvement), continue to monitor  -albumin  only 1.9--->protein supps daily 8.  Postoperative anemia.  Follow-up CBC   9.ID/Sepsis.  Daptomycin  changed to vancomycin  due to elevated CK.  Follow-up infectious disease.  Await long-term plan of care on antibiotic   6/5- wbcs fairly stable at 14k       -CK ordered for Friday 10.  Hypertension.  BP reviewed: currently systolic is elevated and diastolic is soft: Continue Avapro  75  mg daily, Norvasc  10 mg daily, hydralazine  25 mg every 8 hours, HCTZ 12.5 mg daily.  Monitor with increased mobility    Vitals:   06/09/23 0525 06/09/23 0526  BP: (!) 120/56 (!) 120/53  Pulse: (!) 59   Resp: 17   Temp: 98.3 F (36.8 C)   SpO2: 100%     11.  Hyperlipidemia.  Will discuss holding Crestor  with pharmacy due to elevated CK.   12. Hyperglycemia:  hemoglobin A1c reviewed and is lower than normal, continue to monitor 13. ABLA:  -hgb 8.6. may have reached nadir--recheck Monday   -Fe++ supp 14. Slow transit constipation?  6/5-?BM on 6/3. pt seems to be struggling to have BM this morning  -f/u with nursing  -add daily miralax  and senna at night     LOS: 2 days A FACE TO FACE EVALUATION WAS PERFORMED  Katherine Archer 06/09/2023, 7:28 AM

## 2023-06-09 NOTE — Progress Notes (Signed)
 Physical Therapy Session Note  Patient Details  Name: Katherine Archer MRN: [id number] Date of Birth: Apr 08, 1955  Today's Date: 06/09/2023 PT Individual Time: 4098-1191 PT Individual Time Calculation (min): 68 min  PT Missed Time: 7 minutes PT Missed Time Reason: Toileting  Short Term Goals: Week 1:  PT Short Term Goal 1 (Week 1): STG=LTG 2/2 ELOS  Skilled Therapeutic Interventions/Progress Updates:   Received pt supine in bed asleep with interpreter present. Pt agreeable to PT treatment and reported pain 5/10 in R residual limb (premedciated). Session with emphasis on functional mobility/transfers, generalized strengthening and endurance, dynamic standing balance/coordination, and ambulation. Pt transferred supine<>sitting R EOB with supervision/mod I. Pt performed all transfers with RW and CGA throughout session.   Pt performed WC mobility 182ft using BUE and supervision with emphasis on UE strength/coordination. Pt then ambulated 39ft with RW and CGA with assist to manage wound vac and IV pole. Pt with excellent recall of landing softly on L leg. Pt then performed seated tricep extensions 2x10 with emphasis on UE strength.   Transitioned into supine/sidelying/prone with supervision and performed the following exercises with emphasis on LE strength, ROM, and contracture prevention: -L single leg bridges 2x10 -chest press with 10lb dowel 2x15 -attempted shoulder extensions into physioball, but pt unable to complete due to increasing groin/abdominal pain -L sidelying R hip abduction 2x10 -L sidelying R hip extension 2x10 -prone L hamstring curls 2x15 Pt requested to sit up a few times during exercises to stretch due to pain and fatigue. Pt ambulated 22ft with RW and CGA back to [facility], then requested to "rest". Transported back to room and pt reported urge to toilet. Ambulated ~18ft with RW and CGA into bathroom and required min A for clothing management. Pt left in care of NT for toileting. 7 minutes  missed of skilled physical therapy for toileting.   Therapy Documentation Precautions:  Precautions Precautions: Fall Recall of Precautions/Restrictions: Intact Precaution/Restrictions Comments: R AKA, wound vac, RUE PICC-IV antibiotics Restrictions Weight Bearing Restrictions Per Provider Order: Yes RLE Weight Bearing Per Provider Order: Non weight bearing  Therapy/Group: Individual Therapy Nicolas Barren Zaunegger Nena Bank PT, DPT 06/09/2023, 7:05 AM

## 2023-06-09 NOTE — Progress Notes (Signed)
 Occupational Therapy Session Note  Patient Details  Name: Javen Ridings MRN: [id number] Date of Birth: [birth year]  Today's Date: 06/09/2023 OT Individual Time: 0850-1000 OT Individual Time Calculation (min): 70 min    Short Term Goals: Week 1:  OT Short Term Goal 1 (Week 1): STGs=LTGs due to patient's estimated length of stay.  Skilled Therapeutic Interventions/Progress Updates:  Skilled OT intervention completed with focus on limb care education, functional transfers, dynamic standing balance. Pt received seated EOB with interpreter and nursing present for meds. Pt agreeable to session. Unrated pain reported in R residual limb especially with shrinker donning and changes in position. OT offered rest breaks and repositioning throughout for pain reduction.  Pt noted to have R AKA exposed/without shrinker. Education provided on importance of shrinker for edema and Chief Operating Officer. Pt agreeable to donning. Transitioned sit > supine with mod I. Lowered pants via single L leg bridge. Required initial max cues for sequencing and assist for threading of wound vac at hip through shrinker. Mod A overall to donn grey shrinker with hip anchor. Pt able to bridge again to retrieve hip strap, and to redonn pants. Transitioned supine > sit mod I. CGA sit > stand using RW, and CGA stand pivot with RW > w/c.   Pt self-propelled in w/c about 250 ft + 25 ft with 1 intermittent rest break for fatigue. Cues needed for w/c positioning next to mat. CGA sit > stand and stand pivot with RW > EOM. Pt participated in the following dynamic standing balance and endurance tasks to promote independence and safety during BADLs and functional mobility: -cornhole toss activity. CGA sit > stand with RW, and CGA/close supervision needed for balance when RUE unsupported. No LOB however pt needing extended time to lower into sitting due to R limb discomfort.  CGA sit > stand and stand pivot with RW > w/c. Transported dependently in  w/c > room. Stood CGA then ambulated about 15 ft to recliner with RW and great LLE control and no LOB; assist only for wound vac management. Pt remained seated in recliner with LLE elevated, with chair alarm on/activated, and with all needs in reach at end of session.   Therapy Documentation Precautions:  Precautions Precautions: Fall Recall of Precautions/Restrictions: Intact Precaution/Restrictions Comments: R AKA, wound vac, RUE PICC-IV antibiotics Restrictions Weight Bearing Restrictions Per Provider Order: Yes RLE Weight Bearing Per Provider Order: Non weight bearing    Therapy/Group: Individual Therapy  Ruthanna Covert, MS, OTR/L  06/09/2023, 12:09 PM

## 2023-06-10 DIAGNOSIS — E43 Unspecified severe protein-calorie malnutrition: Secondary | ICD-10-CM

## 2023-06-10 HISTORY — DX: Unspecified severe protein-calorie malnutrition: E43

## 2023-06-10 MED ORDER — OXYCODONE HCL ER 10 MG PO T12A
10.0000 mg | EXTENDED_RELEASE_TABLET | Freq: Every day | ORAL | Status: DC
  Administered 2023-06-10 – 2023-06-15 (×6): 10 mg via ORAL
  Filled 2023-06-10 (×6): qty 1

## 2023-06-10 NOTE — IPOC Note (Signed)
 Overall Plan of Care [facility]) Patient Details Name: Katherine Archer MRN: [id number] DOB: 1955/10/09  Admitting Diagnosis: Right above-knee amputee [facility])  Hospital Problems: Principal Problem:   Right above-knee amputee [facility]) Active Problems:   Protein-calorie malnutrition, severe     Functional Problem List: Nursing Bladder, Bowel, Edema, Endurance, Medication Management, Motor, Pain, Perception, Safety, Skin Integrity  PT Balance, Behavior, Edema, Endurance, Motor, Pain, Safety, Sensory, Skin Integrity  OT Balance, Endurance, Pain, Safety, Skin Integrity  SLP    TR         Basic ADL's: OT Bathing, Dressing, Toileting     Advanced  ADL's: OT Simple Meal Preparation, Light Housekeeping     Transfers: PT Bed Mobility, Bed to Chair, Customer service manager, Tub/Shower     Locomotion: PT Ambulation, Wheelchair Mobility     Additional Impairments: OT None  SLP        TR      Anticipated Outcomes Item Anticipated Outcome  Self Feeding    Swallowing      Basic self-care  Mod I  Toileting  Mod I   Bathroom Transfers Mod I  Bowel/Bladder  manage bowels with medications/ manage bladder with toileting assistance  Transfers  mod I  Locomotion  supervision  Communication     Cognition     Pain  <4 w/ prns  Safety/Judgment  manage safety with supervision assistance   Therapy Plan: PT Intensity: Minimum of 1-2 x/day ,45 to 90 minutes PT Frequency: 5 out of 7 days PT Duration Estimated Length of Stay: 7-10 days OT Intensity: Minimum of 1-2 x/day, 45 to 90 minutes OT Frequency: 5 out of 7 days OT Duration/Estimated Length of Stay: 7-10 days     Team Interventions: Nursing Interventions Patient/Family Education, Skin Care/Wound Management, Bladder Management, Bowel Management, Disease Management/Prevention, Pain Management, Medication Management, Discharge Planning  PT interventions Ambulation/gait training, Community reintegration, DME/adaptive equipment instruction,  Neuromuscular re-education, Psychosocial support, Stair training, UE/LE Strength taining/ROM, Wheelchair propulsion/positioning, Warden/ranger, Discharge planning, Pain management, Skin care/wound management, Therapeutic Activities, UE/LE Coordination activities, Cognitive remediation/compensation, Disease management/prevention, Functional mobility training, Patient/family education, Splinting/orthotics, Therapeutic Exercise, Visual/perceptual remediation/compensation  OT Interventions Warden/ranger, Community reintegration, Discharge planning, Disease mangement/prevention, DME/adaptive equipment instruction, Functional electrical stimulation, Functional mobility training, Cognitive remediation/compensation, Neuromuscular re-education, Pain management, Patient/family education, Psychosocial support, UE/LE Strength taining/ROM, Therapeutic Exercise, Therapeutic Activities, Splinting/orthotics, Wheelchair propulsion/positioning, Skin care/wound managment, Visual/perceptual remediation/compensation, Self Care/advanced ADL retraining, UE/LE Coordination activities  SLP Interventions    TR Interventions    SW/CM Interventions Discharge Planning, Psychosocial Support, Patient/Family Education   Barriers to Discharge MD  Medical stability  Nursing Decreased caregiver support, Home environment access/layout, Weight bearing restrictions, Wound Care, IV antibiotics Discharge: House  Discharge Home Layout: One level  Discharge Home Access: Stairs to enter (a ramp is being built)  Entrance Stairs-Rails: Can reach both  Entrance Stairs-Number of Steps: 4  PT Inaccessible home environment, Home environment access/layout, IV antibiotics, Wound Care, Weight bearing restrictions    OT Decreased caregiver support, Home environment access/layout, Wound Care, Weight bearing restrictions    SLP      SW Insurance for SNF coverage, Weight bearing restrictions, Wound Care, IV antibiotics      Team Discharge Planning: Destination: PT-Home ,OT- Home , SLP-  Projected Follow-up: PT-Home health PT, Outpatient PT, OT-  Home health OT, SLP-  Projected Equipment Needs: PT-To be determined, OT- To be determined, SLP-  Equipment Details: PT-pt has RW, pt needs WC, OT-  Patient/family involved in discharge planning:  PT- Patient,  OT-Patient, SLP-   MD ELOS: 7-10d Medical Rehab Prognosis:  Good Assessment: The patient has been admitted for CIR therapies with the diagnosis of R AKA. The team will be addressing functional mobility, strength, stamina, balance, safety, adaptive techniques and equipment, self-care, bowel and bladder mgt, patient and caregiver education, pain management . Goals have been set at mod I. Anticipated discharge destination is Home .        See Team Conference Notes for weekly updates to the plan of care

## 2023-06-10 NOTE — Progress Notes (Signed)
 PROGRESS NOTE  Ipad interpreter service used Subjective/Complaints: Woke up at night to go to BR but then had problem falling back asleep due to pain ,    ROS: Patient denies abd pain , N/V/D or breathing poblems  Objective:   No results found. Recent Labs    06/07/23 2054 06/08/23 0509  WBC 15.8* 14.2*  HGB 8.5* 8.6*  HCT 27.1* 28.3*  PLT 301 315   Recent Labs    06/07/23 2054 06/08/23 0509  NA  --  134*  K  --  3.8  CL  --  101  CO2  --  27  GLUCOSE  --  116*  BUN  --  12  CREATININE 0.72 0.55  CALCIUM   --  8.7*    Intake/Output Summary (Last 24 hours) at 06/10/2023 0917 Last data filed at 06/10/2023 0820 Gross per 24 hour  Intake 1746 ml  Output 300 ml  Net 1446 ml         Physical Exam: Vital Signs Blood pressure (!) 137/49, pulse 67, temperature 98 F (36.7 C), resp. rate 16, height 5\' 6"  (1.676 m), weight 62 kg, SpO2 100%.   General: No acute distress Mood and affect are appropriate Heart: Regular rate and rhythm no rubs murmurs or extra sounds Lungs: Clear to auscultation, breathing unlabored, no rales or wheezes Abdomen: Positive bowel sounds, soft nontender to palpation, nondistended Extremities: BUE and LLENo clubbing, cyanosis, or edema  Psych: Pt's affect is appropriate. Pt is cooperative Skin: Right groin wound with vac. Right AKA, medial thigh incisions well approximated with staples.  mild s/s drainage, ecchymoses and blistering medially around incision and proximally on thigh Left foot with plantar callus , mildly tender Neuro:  Alert and oriented x 3. Normal insight and awareness. Intact Memory. Normal language and speech. Cranial nerve exam unremarkable. MMT: 5/5 BUE. Musculoskeletal: right AK sensitive to touch.  swollen   Assessment/Plan: 1. Functional deficits which require 3+ hours per day of interdisciplinary therapy in a comprehensive inpatient rehab setting. Physiatrist is  providing close team supervision and 24 hour management of active medical problems listed below. Physiatrist and rehab team continue to assess barriers to discharge/monitor patient progress toward functional and medical goals  Care Tool:  Bathing    Body parts bathed by patient: Right arm, Left arm, Chest, Abdomen, Front perineal area, Buttocks, Right upper leg, Left upper leg, Left lower leg, Face     Body parts n/a: Right lower leg   Bathing assist Assist Level: Contact Guard/Touching assist     Upper Body Dressing/Undressing Upper body dressing   What is the patient wearing?: Pull over shirt    Upper body assist Assist Level: Set up assist    Lower Body Dressing/Undressing Lower body dressing      What is the patient wearing?: Pants     Lower body assist Assist for lower body dressing: Contact Guard/Touching assist     Toileting Toileting    Toileting assist Assist for toileting: Contact Guard/Touching assist     Transfers Chair/bed transfer  Transfers assist  Chair/bed transfer activity did not occur: Safety/medical concerns  Chair/bed transfer assist level: Contact Guard/Touching assist  Locomotion Ambulation   Ambulation assist      Assist level: Contact Guard/Touching assist Assistive device: Walker-rolling Max distance: 49ft   Walk 10 feet activity   Assist     Assist level: Contact Guard/Touching assist Assistive device: Walker-rolling   Walk 50 feet activity   Assist Walk 50 feet with 2 turns activity did not occur: Safety/medical concerns (pain/fatigue/lightheadedness)         Walk 150 feet activity   Assist Walk 150 feet activity did not occur: Safety/medical concerns         Walk 10 feet on uneven surface  activity   Assist Walk 10 feet on uneven surfaces activity did not occur: Safety/medical concerns         Wheelchair     Assist Is the patient using a wheelchair?: Yes Type of Wheelchair: Manual     Wheelchair assist level: Supervision/Verbal cueing Max wheelchair distance: 150    Wheelchair 50 feet with 2 turns activity    Assist        Assist Level: Supervision/Verbal cueing   Wheelchair 150 feet activity     Assist      Assist Level: Supervision/Verbal cueing   Blood pressure (!) 137/49, pulse 67, temperature 98 F (36.7 C), resp. rate 16, height 5\' 6"  (1.676 m), weight 62 kg, SpO2 100%.  Medical Problem List and Plan: 1. Functional deficits secondary to right AKA  and I&D groin with sartorius muscle flap 05/31/2023 after failed revascularization procedure             -patient may not shower while wound vac is in place             -ELOS/Goals: 8-11 days S             -Patient is beginning CIR therapies today including PT and OT     2.  Antithrombotics: -DVT/anticoagulation:  Pharmaceutical: Heparin              -antiplatelet therapy: continue Aspirin  81 mg daily   3. Pain Management: continue Neurontin  300 mg twice daily, oxycodone  as needed  add low dose oxycontin  at noc 4. Mood/Behavior/Sleep: Provide emotional support             -antipsychotic agents: N/A   5. Neuropsych/cognition: This patient is capable of making decisions on her own behalf.   6. Skin/Wound Care:  6/5-Wound VAC right groin--per surgery change on Monday by WOC RN.        -continue dry dressing to AKA incision, monitor blistering   7. Fluids/Electrolytes/Nutrition: Routine follow-up and outs follow-up chemistries   6/5-mild hyponatremia- 134 (sl improvement), continue to monitor  -albumin  only 1.9--->protein supps daily 8.  Postoperative anemia.  Follow-up CBC   9.ID/Sepsis.  Daptomycin  changed to vancomycin  due to elevated CK.  Follow-up infectious disease.  Await long-term plan of care on antibiotic   6/5- wbcs fairly stable at 14k       -CK ordered for Friday 10.  Hypertension.  BP reviewed: currently systolic is elevated and diastolic is soft: Continue Avapro  75 mg daily,  Norvasc  10 mg daily, hydralazine  25 mg every 8 hours, HCTZ 12.5 mg daily.  Monitor with increased mobility    Vitals:   06/10/23 0541 06/10/23 0828  BP: (!) 148/51 (!) 137/49  Pulse: 62 67  Resp: 16   Temp: 98 F (36.7 C)   SpO2: 100% 100%    11.  Hyperlipidemia.  Will discuss holding Crestor  with pharmacy due to elevated CK.  12. Hyperglycemia: hemoglobin A1c reviewed and is lower than normal, continue to monitor 13. ABLA:  -hgb 8.6. may have reached nadir--recheck Monday   -Fe++ supp 14. Slow transit constipation?  6/5-?BM on 6/3. pt seems to be struggling to have BM this morning  -f/u with nursing  -add daily miralax  and senna at night     LOS: 3 days A FACE TO FACE EVALUATION WAS PERFORMED  Katherine Archer 06/10/2023, 9:17 AM

## 2023-06-11 LAB — VANCOMYCIN, PEAK: Vancomycin Pk: 43 ug/mL — ABNORMAL HIGH (ref 30–40)

## 2023-06-11 NOTE — Progress Notes (Signed)
 Occupational Therapy Session Note  Patient Details  Name: Kalani Baray MRN: [id number] Date of Birth: 10-11-1955  Today's Date: 06/11/2023 OT Individual Time: 1300-1400 OT Individual Time Calculation (min): 60 min    Short Term Goals: Week 1:  OT Short Term Goal 1 (Week 1): STGs=LTGs due to patient's estimated length of stay.  Skilled Therapeutic Interventions/Progress Updates:    1:1 Pt received in the bed and reported her groin hurt but was able to continue; allowed for rest breaks prn. Pt's incision site at the staples were bleeding through bandage and then limb shrinker. Ot and RN changed bandaged and instead of taping it to her LE applied kurlex and ace wrap. Where pt had been wearing the limb shrinker the wound vac line had made an indention in her skin - education on needed to frequently move the line to avoid skin break down. Pt came to EOB and ambulated with RW to the bathroom and performed toileting and toilet transfer with close supervision. PT demonstrate safe hand placement with use of RW.   Discussed granddaughter home setup ; especially the bathroom. We knew they are renovating it but unsure of the shower situation. Pt educated on and practiced tub bench transfer into tub shower with supervision with RW. Also performed shower stall transfers- hopping backwards over the threshold and then forwards out with regular shower seat in the shower. Pt able to perform with close supervision with cues for RW management. Granddaughter was back in the room and discussed her home - she reports the renovation is not completed and dont know what the shower situation is yet. Discussed she would be able to access both types of showers with DME.   Pt ambulated back from ADL apartment to the room with RW with close supervision to contact guard with extra time. PT did need cue to slow down and not try to "run." Pt returned to room and into bed with extra time due to pain. Pt left with family at  bedside.  Interpreter present for therapy session.   Therapy Documentation Precautions:  Precautions Precautions: Fall Recall of Precautions/Restrictions: Intact Precaution/Restrictions Comments: R AKA, wound vac, RUE PICC-IV antibiotics Restrictions Weight Bearing Restrictions Per Provider Order: No RLE Weight Bearing Per Provider Order: Non weight bearing  Therapy Vitals BP: (!) 172/78 (given hydralazine ) Pain: Pain Assessment Pain Scale: 0-10 Pain Location: Leg Pain Intervention(s): Medication (See eMAR) requested to get back into bed at end of session ; groin was hurting more than residual limb   Therapy/Group: Individual Therapy  Henrene Locust [facility] 06/11/2023, 3:47 PM

## 2023-06-11 NOTE — Progress Notes (Signed)
 PROGRESS NOTE   Subjective/Complaints: Slept throught the night awake and in no pain this am , smiling    ROS: Patient denies abd pain , N/V/D or breathing poblems  Objective:   No results found. No results for input(s): "WBC", "HGB", "HCT", "PLT" in the last 72 hours.  No results for input(s): "NA", "K", "CL", "CO2", "GLUCOSE", "BUN", "CREATININE", "CALCIUM " in the last 72 hours.   Intake/Output Summary (Last 24 hours) at 06/11/2023 0831 Last data filed at 06/10/2023 1338 Gross per 24 hour  Intake 117 ml  Output --  Net 117 ml         Physical Exam: Vital Signs Blood pressure (!) 145/52, pulse 64, temperature 98.6 F (37 C), temperature source Oral, resp. rate 18, height 5\' 6"  (1.676 m), weight 62.7 kg, SpO2 100%.   General: No acute distress Mood and affect are appropriate Heart: Regular rate and rhythm no rubs murmurs or extra sounds Lungs: Clear to auscultation, breathing unlabored, no rales or wheezes Abdomen: Positive bowel sounds, soft nontender to palpation, nondistended Extremities: BUE and LLENo clubbing, cyanosis, or edema  Psych: Pt's affect is appropriate. Pt is cooperative Skin: Right groin wound with vac. Right AKA, medial thigh incisions well approximated with staples.  mild s/s drainage, ecchymoses and blistering medially around incision and proximally on thigh Left foot with plantar callus , mildly tender Neuro:  Alert and oriented x 3. Normal insight and awareness. Intact Memory. Normal language and speech. Cranial nerve exam unremarkable. MMT: 5/5 BUE. Musculoskeletal: right AK sensitive to touch.  swollen Exam unchanged 6/8  Assessment/Plan: 1. Functional deficits which require 3+ hours per day of interdisciplinary therapy in a comprehensive inpatient rehab setting. Physiatrist is providing close team supervision and 24 hour management of active medical problems listed below. Physiatrist and  rehab team continue to assess barriers to discharge/monitor patient progress toward functional and medical goals  Care Tool:  Bathing    Body parts bathed by patient: Right arm, Left arm, Chest, Abdomen, Front perineal area, Buttocks, Right upper leg, Left upper leg, Left lower leg, Face     Body parts n/a: Right lower leg   Bathing assist Assist Level: Contact Guard/Touching assist     Upper Body Dressing/Undressing Upper body dressing   What is the patient wearing?: Pull over shirt    Upper body assist Assist Level: Set up assist    Lower Body Dressing/Undressing Lower body dressing      What is the patient wearing?: Pants     Lower body assist Assist for lower body dressing: Contact Guard/Touching assist     Toileting Toileting    Toileting assist Assist for toileting: Contact Guard/Touching assist     Transfers Chair/bed transfer  Transfers assist  Chair/bed transfer activity did not occur: Safety/medical concerns  Chair/bed transfer assist level: Contact Guard/Touching assist     Locomotion Ambulation   Ambulation assist      Assist level: Contact Guard/Touching assist Assistive device: Walker-rolling Max distance: 52ft   Walk 10 feet activity   Assist     Assist level: Contact Guard/Touching assist Assistive device: Walker-rolling   Walk 50 feet activity   Assist Walk 50 feet with  2 turns activity did not occur: Safety/medical concerns (pain/fatigue/lightheadedness)         Walk 150 feet activity   Assist Walk 150 feet activity did not occur: Safety/medical concerns         Walk 10 feet on uneven surface  activity   Assist Walk 10 feet on uneven surfaces activity did not occur: Safety/medical concerns         Wheelchair     Assist Is the patient using a wheelchair?: Yes Type of Wheelchair: Manual    Wheelchair assist level: Supervision/Verbal cueing Max wheelchair distance: 150    Wheelchair 50 feet with  2 turns activity    Assist        Assist Level: Supervision/Verbal cueing   Wheelchair 150 feet activity     Assist      Assist Level: Supervision/Verbal cueing   Blood pressure (!) 145/52, pulse 64, temperature 98.6 F (37 C), temperature source Oral, resp. rate 18, height 5\' 6"  (1.676 m), weight 62.7 kg, SpO2 100%.  Medical Problem List and Plan: 1. Functional deficits secondary to right AKA  and I&D groin with sartorius muscle flap 05/31/2023 after failed revascularization procedure             -patient may not shower while wound vac is in place             -ELOS/Goals: 8-11 days S             -Patient is beginning CIR therapies today including PT and OT     2.  Antithrombotics: -DVT/anticoagulation:  Pharmaceutical: Heparin              -antiplatelet therapy: continue Aspirin  81 mg daily   3. Pain Management: continue Neurontin  300 mg twice daily, oxycodone  as needed  add low dose oxycontin  at noc- was helpful with sleeping throught the night , would anticipate using only during CIR stay 4. Mood/Behavior/Sleep: Provide emotional support             -antipsychotic agents: N/A   5. Neuropsych/cognition: This patient is capable of making decisions on her own behalf.   6. Skin/Wound Care:  6/5-Wound VAC right groin--per surgery change on Monday by WOC RN.        -continue dry dressing to AKA incision, monitor blistering   7. Fluids/Electrolytes/Nutrition: Routine follow-up and outs follow-up chemistries   6/5-mild hyponatremia- 134 (sl improvement), continue to monitor  -albumin  only 1.9--->protein supps daily 8.  Postoperative anemia.  Follow-up CBC   9.ID/Sepsis.  Daptomycin  changed to vancomycin  due to elevated CK.  Follow-up infectious disease.  Await long-term plan of care on antibiotic   6/5- wbcs fairly stable at 14k       -CK ordered for Friday 10.  Hypertension.  BP reviewed: currently systolic is elevated and diastolic is soft: Continue Avapro  75 mg  daily, Norvasc  10 mg daily, hydralazine  25 mg every 8 hours, HCTZ 12.5 mg daily.  Monitor with increased mobility    Vitals:   06/10/23 2229 06/11/23 0521  BP: (!) 152/57 (!) 145/52  Pulse:  64  Resp:  18  Temp:  98.6 F (37 C)  SpO2:  100%    11.  Hyperlipidemia.  Will discuss holding Crestor  with pharmacy due to elevated CK.   12. Hyperglycemia: hemoglobin A1c reviewed and is lower than normal, continue to monitor 13. ABLA:  -hgb 8.6. may have reached nadir--recheck Monday   -Fe++ supp 14. Slow transit constipation?  6/5-?BM on 6/3. pt seems  to be struggling to have BM this morning  -f/u with nursing  -add daily miralax  and senna at night     LOS: 4 days A FACE TO FACE EVALUATION WAS PERFORMED  Katherine Archer 06/11/2023, 8:31 AM

## 2023-06-11 NOTE — Progress Notes (Signed)
 Occupational Therapy Session Note  Patient Details  Name: Katherine Archer MRN: [id number] Date of Birth: December 12, 1955  Today's Date: 06/11/2023 OT Individual Time: 0900-1015 OT Individual Time Calculation (min): 75 min    Short Term Goals: Week 1:  OT Short Term Goal 1 (Week 1): STGs=LTGs due to patient's estimated length of stay.  Skilled Therapeutic Interventions/Progress Updates:    Patient resting in bed at the time of arrival.  Patient indicates a pain response of 8 on 0-10 with residual limb and shaper in place. Interpreter present to assist with language barrier. Patient in agreement with working on sit to stands and core strengthening. Patient was instructed in relaxation breathing to alleviate her pain response. Patient instructed in the importance of skin checks. Patient able to apply lotion with s/uA to RLE. Patient able to come from supine in bed to EOB with SBA. The pt was able to come from sit to stand using the RW with CGA. Patient was able to ambulate to the restroom using the RW with close S.  The pt was able to complete toileting hygiene with close S. The pt was was able to ambulate to the sink and wash her face and hands with s/uA. The pt went on to brush her teeth with s/u A in standing using the RW with close S  Following BADL rrelated task,  the pt was able to tolerate a resistance in standing 3x for a count of 10 using the RW for additional balance while positioned in front of the bed. The pt went on to complete a towel exercise against resistance seated EOB  3 sets of 10.  The pt went on to complete the UB cycle for 20 minutes in duration with 2 rest breaks. At the end of the session, the pt was returned to bed LOF by transferring from  EOB to supine in bed with SBA.  The call light and bed side table were both placed within reach with all additional needs addressed prior to exiting the room.  Therapy Documentation Precautions:  Precautions Precautions: Fall Recall of  Precautions/Restrictions: Intact Precaution/Restrictions Comments: R AKA, wound vac, RUE PICC-IV antibiotics Restrictions Weight Bearing Restrictions Per Provider Order: No RLE Weight Bearing Per Provider Order: Non weight bearing Therapy/Group: Individual Therapy  Moises Ang 06/11/2023, 5:37 PM

## 2023-06-11 NOTE — Progress Notes (Signed)
 Physical Therapy Session Note  Patient Details  Name: Katherine Archer MRN: [id number] Date of Birth: [birth year]  Today's Date: 06/11/2023 PT Individual Time: 1101-1145 PT Individual Time Calculation (min): 44 min  and Today's Date: 06/11/2023 PT Missed Time: 16 Minutes Missed Time Reason: Patient fatigue  Short Term Goals: Week 1:  PT Short Term Goal 1 (Week 1): STG=LTG 2/2 ELOS  Skilled Therapeutic Interventions/Progress Updates:      Pt sitting EOB upon arrival. Pt agreeable to therapy. Pt denies any pain however endorses increased sleepiness/fatigue today (at end of session).   Nurse present to start IV antibiotics.   Discussed pt home entry. Pt reports 5 steps with B HR. Pt previously informed PT that pt family is installing a ramp, but today reports she needs to follow up with her daughter.   Initiated stair navigation in case the ramp is not installed prior to D/C.   PT provided demonstration of stair navigation with shower chair technique versus hop to.   Pt reports preference for hop to technique.   Trialed stair navigation on 3 inch steps, pt performed x1 with B UE with min A, opted to discontinue 2/2 pt fatigue, and mangement of IV/wound vac.   Pt navigated 4 6 inch steps with shower chair technique and CGA/min A, with therapist helping with management of shower chair. Verbal and tactile cues provided for sequencing.   Pt requesting to use bathroom.   Pt ambulated doorway of room to bathroom with RW and CGA/close supervision. Pt continent of bladder. Pt donned/doffed pants with supervision.   Pt performed ambulatory transfer to bed with RW.   Pt supine in bed with all needs within reach and bed alarm on.   Pt missed 16 minutes 2/2 fatigue.     Therapy Documentation Precautions:  Precautions Precautions: Fall Recall of Precautions/Restrictions: Intact Precaution/Restrictions Comments: R AKA, wound vac, RUE PICC-IV antibiotics Restrictions Weight Bearing  Restrictions Per Provider Order: No RLE Weight Bearing Per Provider Order: Non weight bearing  Therapy/Group: Individual Therapy  [facility] Jenney Modest, [place], DPT  06/11/2023, 12:35 PM

## 2023-06-12 DIAGNOSIS — K5900 Constipation, unspecified: Secondary | ICD-10-CM

## 2023-06-12 DIAGNOSIS — M79604 Pain in right leg: Secondary | ICD-10-CM

## 2023-06-12 LAB — VANCOMYCIN, TROUGH: Vancomycin Tr: 10 ug/mL — ABNORMAL LOW (ref 15–20)

## 2023-06-12 LAB — BASIC METABOLIC PANEL WITH GFR
Anion gap: 7 (ref 5–15)
BUN: 9 mg/dL (ref 8–23)
CO2: 26 mmol/L (ref 22–32)
Calcium: 9.2 mg/dL (ref 8.9–10.3)
Chloride: 104 mmol/L (ref 98–111)
Creatinine, Ser: 0.62 mg/dL (ref 0.44–1.00)
GFR, Estimated: >60 mL/min (ref >60)
Glucose, Bld: 96 mg/dL (ref 70–99)
Potassium: 4 mmol/L (ref 3.5–5.1)
Sodium: 137 mmol/L (ref 135–145)

## 2023-06-12 LAB — CBC
HCT: 27.2 % — ABNORMAL LOW (ref 36.0–46.0)
Hemoglobin: 8.7 g/dL — ABNORMAL LOW (ref 12.0–15.0)
MCH: 30.5 pg (ref 26.0–34.0)
MCHC: 32 g/dL (ref 30.0–36.0)
MCV: 95.4 fL (ref 80.0–100.0)
Platelets: 392 10*3/uL (ref 150–400)
RBC: 2.85 MIL/uL — ABNORMAL LOW (ref 3.87–5.11)
RDW: 15.9 % — ABNORMAL HIGH (ref 11.5–15.5)
WBC: 8.4 10*3/uL (ref 4.0–10.5)
nRBC: 0 % (ref 0.0–0.2)

## 2023-06-12 LAB — CK: Total CK: 603 U/L — ABNORMAL HIGH (ref 38–234)

## 2023-06-12 MED ORDER — VANCOMYCIN HCL 1250 MG/250ML IV SOLN
1250.0000 mg | Freq: Every day | INTRAVENOUS | Status: DC
  Administered 2023-06-12 – 2023-06-16 (×4): 1250 mg via INTRAVENOUS
  Filled 2023-06-12 (×4): qty 250

## 2023-06-12 MED ORDER — VANCOMYCIN HCL 1.25 G IV SOLR
1250.0000 mg | Freq: Every day | INTRAVENOUS | Status: DC
  Filled 2023-06-12: qty 25

## 2023-06-12 NOTE — Progress Notes (Signed)
 Physical Therapy Session Note  Patient Details  Name: Katherine Archer MRN: [id number] Date of Birth: October 17, 1955  Today's Date: 06/12/2023 PT Individual Time: 1505-1540 PT Individual Time Calculation (min): 35 min   Short Term Goals: Week 1:  PT Short Term Goal 1 (Week 1): STG=LTG 2/2 ELOS  Skilled Therapeutic Interventions/Progress Updates:    Pt presents in room in bed, reporting incisional pain in RLE, premedicated. Interpreter present throughout session. Therapist offered to rewrap dressing however pt declined due to pt reporting ongoing bleeding. Session focused on transfer training, education on managing vascular disease, and gait training. Pt completes bed mobility with modI, completes transfers with CGA with RW demonstrating minimal postural sway. Pt completes WC mobility with BUEs to main gym supervision completed to promote BUE endurance. Pt completes gait 53' with RW with CGA demonstrating excellent postural stability and foot clearance. Pt educated on smoking cessation as ongoing management of vascular disease with pt verbalizing understanding. Pt returns to room and completes transfer back to bed with supervision with RW. Pt returns to supine and remains with all needs within reach, all 4 bed rails elevated per pt request, and call light in place at end of session.  Therapy Documentation Precautions:  Precautions Precautions: Fall Recall of Precautions/Restrictions: Intact Precaution/Restrictions Comments: R AKA, wound vac, RUE PICC-IV antibiotics Restrictions Weight Bearing Restrictions Per Provider Order: No RLE Weight Bearing Per Provider Order: Non weight bearing   Therapy/Group: Individual Therapy  Annia Kilts PT, DPT 06/12/2023, 3:56 PM

## 2023-06-12 NOTE — Progress Notes (Addendum)
  Progress Note    06/12/2023 7:42 AM * No surgery found *  Subjective:  pain in R groin and AKA has been tolerable since being discharged to rehab   Vitals:   06/11/23 1841 06/12/23 0327  BP: (!) 163/56 (!) 164/58  Pulse: 71 (!) 59  Resp: 17 18  Temp: 98.1 F (36.7 C) 97.6 F (36.4 C)  SpO2: 100% 100%   Physical Exam: Lungs:  non labored Incisions:  R groin with wound vac, good seal, serosanguinous collection in cannister; R AKA incision pictured below; some devitalized skin edges but not frankly necrotic Extremities:  L foot warm well perfused; palpable fem fem pulse Neurologic: A&O    CBC    Component Value Date/Time   WBC 8.4 06/12/2023 0518   RBC 2.85 (L) 06/12/2023 0518   HGB 8.7 (L) 06/12/2023 0518   HGB 14.2 03/23/2021 0928   HCT 27.2 (L) 06/12/2023 0518   HCT 42.1 03/23/2021 0928   PLT 392 06/12/2023 0518   PLT 135 (L) 03/23/2021 0928   MCV 95.4 06/12/2023 0518   MCV 93 03/23/2021 0928   MCH 30.5 06/12/2023 0518   MCHC 32.0 06/12/2023 0518   RDW 15.9 (H) 06/12/2023 0518   RDW 12.0 03/23/2021 0928   LYMPHSABS 2.2 06/08/2023 0509   LYMPHSABS 1.9 03/23/2021 0928   MONOABS 1.0 06/08/2023 0509   EOSABS 0.1 06/08/2023 0509   EOSABS 0.3 03/23/2021 0928   BASOSABS 0.0 06/08/2023 0509   BASOSABS 0.0 03/23/2021 0928    BMET    Component Value Date/Time   NA 134 (L) 06/08/2023 0509   NA 138 01/15/2020 1128   K 3.8 06/08/2023 0509   CL 101 06/08/2023 0509   CO2 27 06/08/2023 0509   GLUCOSE 116 (H) 06/08/2023 0509   BUN 12 06/08/2023 0509   BUN 8 01/15/2020 1128   CREATININE 0.55 06/08/2023 0509   CALCIUM  8.7 (L) 06/08/2023 0509   GFRNONAA >60 06/08/2023 0509   GFRAA 80 01/15/2020 1128    INR    Component Value Date/Time   INR 1.5 (H) 05/29/2023 0030     Intake/Output Summary (Last 24 hours) at 06/12/2023 9147 Last data filed at 06/11/2023 1800 Gross per 24 hour  Intake 358 ml  Output --  Net 358 ml     Assessment/Plan:  68 y.o. female is  s/p R leg bypass infection with subsequent AKA   Some devitalized skin edges but stump is not frankly necrotic Continue daily and prn dressing changes R groin with peel and place vac with good seal; this was changed on Friday due to loss of seal; change again Thursday or Friday Encouraged nutrition Vascular will continue to follow intermittently    Katherine Deters, PA-C [facility] [phone] 06/12/2023 7:42 AM

## 2023-06-12 NOTE — Progress Notes (Signed)
 Physical Therapy Session Note  Patient Details  Name: Katherine Archer MRN: [id number] Date of Birth: [birth year]  Today's Date: 06/12/2023 PT Individual Time: 3244-0102 PT Individual Time Calculation (min): 60 min   Short Term Goals: Week 1:  PT Short Term Goal 1 (Week 1): STG=LTG 2/2 ELOS  Skilled Therapeutic Interventions/Progress Updates:      Pt received from nursing using bathroom upon arrival. Pt continent of bladder. Pt performed pericare with supervision while seated. Pt donned pants with RW and CGA/supervision.   Therapist managing wound vac throughout session.   Followed up with pt regarding ramp. Pt reports she a family member is a Surveyor, [employer] and plans to build ramp however the bathroom rennovations take priority so the ramp will likely not be complete until late June. Education provided regarding ELOS, and plan to continue with stair training for discharge home.  Pt verbalized understanding and agreeable. Home measurement sheet provided to pt/pt daughter wit emphasis on measuring height of stairs.  Discussed family attending family training to assist with stair navigation. Pt granddaughter plans to attend family training 6/10 at 10am.   Pt navigated 4 6 inch steps with B HR and CGA with hop to gait, pt demos good technique with light landing. Pt required seated rest break at top of steps for fatigue.   Pt performed ambulatory transfer to bed with RW and CGA. Pt supine in bed with all needs within reach and bed alarm on.   Therapy Documentation Precautions:  Precautions Precautions: Fall Recall of Precautions/Restrictions: Intact Precaution/Restrictions Comments: R AKA, wound vac, RUE PICC-IV antibiotics Restrictions Weight Bearing Restrictions Per Provider Order: No RLE Weight Bearing Per Provider Order: Non weight bearing  Therapy/Group: Individual Therapy  [facility] Jenney Modest, [place], DPT  06/12/2023, 7:48 AM

## 2023-06-12 NOTE — Progress Notes (Signed)
 Pharmacy Antibiotic Note  Katherine Archer is a 68 y.o. female admitted on 06/07/2023 with worsening RLE pain and swelling. Pt had a fem-pop bypass with redo and recently worsening swelling. Found to have bacteremia and right groin graft infection. Pharmacy has been consulted to transition from daptomycin  to vancomycin  dosing for increasing CK  2100 > 3200.  Vancomycin  peak 6/8 @ 43, vancomycin  trough 6/9 @ 10, calculated AUC 575. Will reduce to vancomycin  1250mg  IV Q24h for estimated AUC 478.   Plan: Reduce to vancomycin  1250mg  IV Q24h  F/u cultures, clinical progress, levels as indicated End date: 07/12/23   Height: 5\' 6"  (167.6 cm) Weight: 61.4 kg (135 lb 5.8 oz) IBW/kg (Calculated) : 59.3  Temp (24hrs), Avg:97.9 F (36.6 C), Min:97.6 F (36.4 C), Max:98.1 F (36.7 C)  Recent Labs  Lab 06/06/23 0530 06/07/23 0500 06/07/23 0550 06/07/23 2054 06/08/23 0509 06/11/23 1334 06/12/23 0518 06/12/23 0907  WBC 12.8*  --  15.6* 15.8* 14.2*  --  8.4  --   CREATININE 0.62 0.53  --  0.72 0.55  --  0.62  --   VANCOTROUGH  --   --   --   --   --   --   --  10*  VANCOPEAK  --   --   --   --   --  43*  --   --     Estimated Creatinine Clearance: 64.8 mL/min (by C-G formula based on SCr of 0.62 mg/dL).    No Active Allergies  Antimicrobials this admission: Vanc 5/26 > 5/27, 6/3 >> Zosyn  5/26 > 5/27 PCN 5/27 > 5/29 Zyvox  5/27 > 5/29 Dapto 5/29 > 6/03  Microbiology results: 5/25 BCx: GPC in 1/2 bottles, BCID with strep pyogenes 5/26 Abscess Cx: MRSA, strep pyogenes 5/27 BCx: negative  Thank you for allowing pharmacy to be a part of this patient's care.  Claudia Cuff, PharmD, BCPS Clinical Pharmacist

## 2023-06-12 NOTE — Progress Notes (Signed)
 PROGRESS NOTE   Subjective/Complaints: Reports sleep medication made her fall asleep very quickly yesterday. Reports BM yesterday.    ROS: Patient denies fever, HA, N/V/D/C, Chest pain  Objective:   No results found. Recent Labs    06/12/23 0518  WBC 8.4  HGB 8.7*  HCT 27.2*  PLT 392    Recent Labs    06/12/23 0518  NA 137  K 4.0  CL 104  CO2 26  GLUCOSE 96  BUN 9  CREATININE 0.62  CALCIUM  9.2     Intake/Output Summary (Last 24 hours) at 06/12/2023 1027 Last data filed at 06/12/2023 0912 Gross per 24 hour  Intake 368 ml  Output --  Net 368 ml         Physical Exam: Vital Signs Blood pressure (!) 164/58, pulse (!) 59, temperature 97.6 F (36.4 C), temperature source Oral, resp. rate 18, height 5\' 6"  (1.676 m), weight 61.4 kg, SpO2 100%.   General: No acute distress, working with therapy in the gym Mood and affect are appropriate Heart: Regular rate and rhythm no rubs murmurs or extra sounds Lungs: Clear to auscultation, breathing unlabored, no rales or wheezes Abdomen: Positive bowel sounds, soft nontender to palpation, nondistended Extremities: BUE and LLENo clubbing, cyanosis, or edema  Psych: Pt's affect is appropriate. Pt is cooperative Skin: Right groin wound with vac, small amount of serosanguinous fluid in canister, Right AKA, medial thigh incisions well approximated with staples.  mild s/s drainage, ecchymoses and blistering medially around incision and proximally on thigh Left foot with plantar callus , mildly tender Neuro:  Alert and oriented x 3. Normal insight and awareness. Intact Memory. Normal language and speech. Cranial nerve exam unremarkable. MMT: 5/5 BUE. Musculoskeletal: right AK sensitive to touch.  swollen Exam unchanged 6/9  Assessment/Plan: 1. Functional deficits which require 3+ hours per day of interdisciplinary therapy in a comprehensive inpatient rehab  setting. Physiatrist is providing close team supervision and 24 hour management of active medical problems listed below. Physiatrist and rehab team continue to assess barriers to discharge/monitor patient progress toward functional and medical goals  Care Tool:  Bathing    Body parts bathed by patient: Right arm, Left arm, Chest, Abdomen, Front perineal area, Buttocks, Right upper leg, Left upper leg, Left lower leg, Face     Body parts n/a: Right lower leg   Bathing assist Assist Level: Contact Guard/Touching assist     Upper Body Dressing/Undressing Upper body dressing   What is the patient wearing?: Pull over shirt    Upper body assist Assist Level: Set up assist    Lower Body Dressing/Undressing Lower body dressing      What is the patient wearing?: Pants     Lower body assist Assist for lower body dressing: Contact Guard/Touching assist     Toileting Toileting    Toileting assist Assist for toileting: Supervision/Verbal cueing     Transfers Chair/bed transfer  Transfers assist  Chair/bed transfer activity did not occur: Safety/medical concerns  Chair/bed transfer assist level: Contact Guard/Touching assist     Locomotion Ambulation   Ambulation assist      Assist level: Contact Guard/Touching assist Assistive device: Walker-rolling Max distance:  67ft   Walk 10 feet activity   Assist     Assist level: Contact Guard/Touching assist Assistive device: Walker-rolling   Walk 50 feet activity   Assist Walk 50 feet with 2 turns activity did not occur: Safety/medical concerns (pain/fatigue/lightheadedness)         Walk 150 feet activity   Assist Walk 150 feet activity did not occur: Safety/medical concerns         Walk 10 feet on uneven surface  activity   Assist Walk 10 feet on uneven surfaces activity did not occur: Safety/medical concerns         Wheelchair     Assist Is the patient using a wheelchair?: Yes Type of  Wheelchair: Manual    Wheelchair assist level: Supervision/Verbal cueing Max wheelchair distance: 150    Wheelchair 50 feet with 2 turns activity    Assist        Assist Level: Supervision/Verbal cueing   Wheelchair 150 feet activity     Assist      Assist Level: Supervision/Verbal cueing   Blood pressure (!) 164/58, pulse (!) 59, temperature 97.6 F (36.4 C), temperature source Oral, resp. rate 18, height 5\' 6"  (1.676 m), weight 61.4 kg, SpO2 100%.  Medical Problem List and Plan: 1. Functional deficits secondary to right AKA  and I&D groin with sartorius muscle flap 05/31/2023 after failed revascularization procedure             -patient may not shower while wound vac is in place             -ELOS/Goals: 8-11 days S             -Continue PT, OT    2.  Antithrombotics: -DVT/anticoagulation:  Pharmaceutical: Heparin              -antiplatelet therapy: continue Aspirin  81 mg daily   3. Pain Management: continue Neurontin  300 mg twice daily, oxycodone  as needed  add low dose oxycontin  at noc- was helpful with sleeping throught the night , would anticipate using only during CIR stay  6/9 do no see a specific medication for sleep, I think she got oxycodone  that helped her sleep quickly, continue current regimen for now  4. Mood/Behavior/Sleep: Provide emotional support             -antipsychotic agents: N/A   5. Neuropsych/cognition: This patient is capable of making decisions on her own behalf.   6. Skin/Wound Care:  6/5-Wound VAC right groin--per surgery change on Monday by WOC RN.        -continue dry dressing to AKA incision, monitor blistering   7. Fluids/Electrolytes/Nutrition: Routine follow-up and outs follow-up chemistries   6/5-mild hyponatremia- 134 (sl improvement), continue to monitor  -albumin  only 1.9--->protein supps daily  6/9 Na up to 137- monitor  8.  Postoperative anemia.  Follow-up CBC   9.ID/Sepsis.  Daptomycin  changed to vancomycin  due to  elevated CK.  Follow-up infectious disease.  Await long-term plan of care on antibiotic   6/5- wbcs fairly stable at 14k       -CK ordered for Friday-603 6/9 improved 10.  Hypertension.  BP reviewed: currently systolic is elevated and diastolic is soft: Continue Avapro  75 mg daily, Norvasc  10 mg daily, hydralazine  25 mg every 8 hours, HCTZ 12.5 mg daily.  Monitor with increased mobility  6/9 continue to monitor trend for now    Vitals:   06/11/23 1841 06/12/23 0327  BP: (!) 163/56 (!) 164/58  Pulse: 71 (!)  59  Resp: 17 18  Temp: 98.1 F (36.7 C) 97.6 F (36.4 C)  SpO2: 100% 100%    11.  Hyperlipidemia.  Will discuss holding Crestor  with pharmacy due to elevated CK.   12. Hyperglycemia: hemoglobin A1c reviewed and is lower than normal, continue to monitor 13. ABLA:  -hgb 8.6. may have reached nadir--recheck Monday   -Fe++ supp  -6/9 hgb stable at 8.7 14. Slow transit constipation?  6/5-?BM on 6/3. pt seems to be struggling to have BM this morning  -f/u with nursing  -add daily miralax  and senna at night  -6/9 Pt reports BM yesterday, continue to follow     LOS: 5 days A FACE TO FACE EVALUATION WAS PERFORMED  Lylia Sand 06/12/2023, 10:27 AM

## 2023-06-12 NOTE — Progress Notes (Addendum)
 Nutrition Follow-up / Consult  DOCUMENTATION CODES:   Severe malnutrition in context of chronic illness  INTERVENTION:   Continue vanilla Ensure Plus High Protein po BID, each supplement provides 350 kcal and 20 grams of protein. Continue vitamin C  and zinc  sulfate to promote wound healing. Add [place] yogurt to meals TID.  Add Magic cup TID with meals, each supplement provides 290 kcal and 9 grams of protein. Education provided on increasing protein intake.  NUTRITION DIAGNOSIS:   Severe Malnutrition related to chronic illness as evidenced by severe muscle depletion, severe fat depletion.  Ongoing   GOAL:   Patient will meet greater than or equal to 90% of their needs  Progressing   MONITOR:   PO intake, Supplement acceptance  REASON FOR ASSESSMENT:   Consult Wound healing  ASSESSMENT:   Pt with PMH of HTN, CVA, HLD, PAD with hx of multiple revascularizations with recent admissions d/c'ed home 5/12 after revascularization then admitted to [facility] 5/25 with wound dehiscence/sepsis s/p R AKA 5/28 now admitted to rehab. Pt is non-English speaking.  Spoke with patient along with her daughter. Patient declined using interpretor since daughter was present. Patient does not like the hospital food, so family has been bringing in some foods for patient to eat. She is not a big meat eater, she likes chicken, but not the chicken she receives here. She likes Ensure Plus High Protein, as long as it is vanilla. She also likes [place] yogurt, all flavors, RD to add to meal trays. Patient is trying to avoid sugary items to avoid increasing blood sugar.   Provided patient with Spanish education handout, "Tips for Adding Protein" from the Academy [employer]. Patient and daughter were both appreciative. Discussed other foods (besides meat) that are good sources of protein.   Currently on a regular diet, meal intakes 0-100%.  Labs reviewed.   Medications reviewed and include  vitamin C , zinc  sulfate, ferrous sulfate , miralax , senokot.  Admit weight 65.8 kg Current weight 61.4 kg  Diet Order:   Diet Order             Diet regular Room service appropriate? Yes; Fluid consistency: Thin  Diet effective now                   EDUCATION NEEDS:   Education needs have been addressed  Skin:  Skin Assessment:  (R AKA)  Last BM:  6/6  Height:   Ht Readings from Last 1 Encounters:  06/07/23 5\' 6"  (1.676 m)    Weight:   Wt Readings from Last 1 Encounters:  06/12/23 61.4 kg    BMI:  Body mass index is 21.85 kg/m.  Estimated Nutritional Needs:   Kcal:  1700-1900  Protein:  85-100 grams  Fluid:  >1.7 L/day   Barnet Boots RD, LDN, CNSC Contact via secure chat. If unavailable, use group chat "RD Inpatient."

## 2023-06-12 NOTE — Group Note (Signed)
 Patient Details Name: Maryuri Warnke MRN: [id number] DOB: 01-28-1955 Today's Date: 06/12/2023  Time Calculation: OT Group Time Calculation OT Group Start Time: 1115 OT Group Stop Time: 1200 OT Group Time Calculation (min): 45 min      Group Description: w/c group: Description: Pt participated in UE/Wheelchair skills group to focus on functional endurance, BUE strengthening, w/c mobility training and w/c parts management re-training in a social setting. Education provided and return demonstration of management of legrests, brakes, and armrests on w/c. Education provided on energy conservation, efficient propulsion technique, and proper positioning for shoulder preservation.   Individual level documentation: Pt requires mod I assist for w/c mobility and mod I for parts management. Pt engaged in  of w/c push ups for UB strengthening.  Pain: No indications   Precautions:    Mollie Anger [facility] 06/12/2023, 12:38 PM

## 2023-06-12 NOTE — Progress Notes (Signed)
 Occupational Therapy Session Note  Patient Details  Name: Katherine Archer MRN: [id number] Date of Birth: May 25, 1955  Today's Date: 06/12/2023 OT Individual Time: 4098-1191 OT Individual Time Calculation (min): 57 min    Short Term Goals: Week 1:  OT Short Term Goal 1 (Week 1): STGs=LTGs due to patient's estimated length of stay.  Skilled Therapeutic Interventions/Progress Updates:   Pt greeted semi reclined in bed, pt agreeable to OT intervention.    Interpreter present for most of session.  Waiting on nursing to complete dressing for incision however nurse did not receive orders in reports ( nurse reaching out for clarification)   therefore donned gauze over incision d/t mild drainage and donned ace wrap in limb wrapping as pt declined wearing AKA shrinker. Nurse aware.   Transfers/bed mobility/functional mobility: pt completed supine>sit MODI. Pt completed stand pivot transfer throughout session with and CGA with RW.   Therapeutic activity:  Pt completed standing tolerance task with pt instructed to stand with RW to reach of BOS to place/remove clothespins on basketball goal net to simualte IADL tasks. Pt completed task with CGA with no LOB, pt able to maintain balance with unilateral support with CGA. Pt able to alternate reaching with BUES with CGA.    Ended session with pt seated in recliner with all needs within reach.            Therapy Documentation Precautions:  Precautions Precautions: Fall Recall of Precautions/Restrictions: Intact Precaution/Restrictions Comments: R AKA, wound vac, RUE PICC-IV antibiotics Restrictions Weight Bearing Restrictions Per Provider Order: No RLE Weight Bearing Per Provider Order: Non weight bearing  Pain: Unrated pain reported in R AKA, rest breaks provided as needed.    Therapy/Group: Individual Therapy  Mollie Anger [facility] 06/12/2023, 12:09 PM

## 2023-06-13 DIAGNOSIS — T827XXD Infection and inflammatory reaction due to other cardiac and vascular devices, implants and grafts, subsequent encounter: Secondary | ICD-10-CM

## 2023-06-13 MED ORDER — OXYCODONE HCL 5 MG PO TABS
5.0000 mg | ORAL_TABLET | ORAL | Status: DC | PRN
  Administered 2023-06-13 – 2023-06-15 (×9): 10 mg via ORAL
  Administered 2023-06-16: 5 mg via ORAL
  Filled 2023-06-13 (×10): qty 2
  Filled 2023-06-13: qty 1

## 2023-06-13 MED ORDER — SORBITOL 70 % SOLN
30.0000 mL | Freq: Every day | Status: DC | PRN
  Administered 2023-06-13: 30 mL via ORAL
  Filled 2023-06-13: qty 30

## 2023-06-13 NOTE — Progress Notes (Signed)
 Physical Therapy Session Note  Patient Details  Name: Katherine Archer MRN: [id number] Date of Birth: Oct 16, 1955  Today's Date: 06/13/2023 PT Individual Time: 4098-1191, 4782-9562 PT Individual Time Calculation (min): 57 min, 59,   Short Term Goals: Week 1:  PT Short Term Goal 1 (Week 1): STG=LTG 2/2 ELOS  Skilled Therapeutic Interventions/Progress Updates:     Treatment Session 1  Pt supine in bed upon arrival. Pt agreeable to therapy. Pt denies any pain. Interpretor present to translate throughout session.   Pt reports dry dressing changed during night shift. Therapist added ace wrap to R LE residual limb.   Therpaist managing wound vac thorughout session.   Education provided regarding importance of daily skin inspection of B LE. Education provided for recognizing signs and symptoms of pressure injury,  infection and ischemia.  Education provided for importance of wearing tennis shoe on L LE for protection of L LE and to reduce risk of pressure injury. Education provided regarding importance of limiting unnecessary forces on L LE to reduce risk of pressure injury. Recommended pt use WC where accessibel and ambulate short distances only for bathroom access (where WC is inaccessible) to limit unnecessary forces on R LE.   Pt utilized skin inspection mirror to perform skin inspection on LLE, therpaist emphasized importance of looking in between toes as well as all other aspects of foot. Pt simulated skin inspection of R LE 2/2 dressing and ace wrap recently being changed. Therapist recommended pt perform skin inspection next time dressing is removed. Pt verbalized understanding and agreeable. Therapist provided pt with skin inspection mirror.   Pt performed squat pivot transfer bed to [facility] with use of arm rest of WC with supervision, education provided for positioning of WC, and technique for donning arm rests. Pt demos improved carry over with squat pivot transfer WC to bed.   Pt performed stand  pivot transfer WC to car simulator with RW and supervision.   Education provided how to fold RW and WC, plan to provide education to pt granddaughter during family training as well.   Pt supine in bed at end of session with all needs within reach and bed alarm on.   Treatment Session 2   Pt supine in bed upon arrival. Pt agreeable to therapy. Pt denies any pain.   Pt granddaughter unable to attend scheduled family training today. Rescheduled for tomorrow at 2:45 during OT session 2/2 pt caregiver availability.   Pt performed squat pivot transfer WC to rehab apartment bed with supervision, education provided for postioning WC closer to bed and removing leg rest prior to transferring. Pt performed bed mobility with mod I on apartment bed.   Pt performed ambulatory transfer to [facility] over toilet x2 throughout session in rehab apartment with RW and supervision 2/2 diarrhea. Pt continent of bowel and bladder. Notified nurse and MD of diarrhea.   Provided pt with bag for front of pt walker.   Pt utilized RW and reacher to pick up 3 cones from multi height surfaces.   Pt utilized reacher to pick up objects at multi heights from [facility] level, verbal cues provided for positioning of WC to side of object.   Pt supine in bed at end of session with all needs within reach and bed alarm on.         Therapy Documentation Precautions:  Precautions Precautions: Fall Recall of Precautions/Restrictions: Intact Precaution/Restrictions Comments: R AKA, wound vac, RUE PICC-IV antibiotics Restrictions Weight Bearing Restrictions Per Provider Order: Yes RLE Weight Bearing  Per Provider Order: Non weight bearing  Therapy/Group: Individual Therapy  [facility] Jenney Modest, [place], DPT  06/13/2023, 7:37 AM

## 2023-06-13 NOTE — Progress Notes (Signed)
 PROGRESS NOTE   Subjective/Complaints: Pt having pain at R residual limb after therapy, 8-9/10. She is not due for pain medications yet. Reports Bm yesterday.    ROS: Patient denies Chills, HA, N/V/D/C, Chest pain, SOB  Objective:   No results found. Recent Labs    06/12/23 0518  WBC 8.4  HGB 8.7*  HCT 27.2*  PLT 392    Recent Labs    06/12/23 0518  NA 137  K 4.0  CL 104  CO2 26  GLUCOSE 96  BUN 9  CREATININE 0.62  CALCIUM  9.2     Intake/Output Summary (Last 24 hours) at 06/13/2023 1036 Last data filed at 06/12/2023 2103 Gross per 24 hour  Intake 837 ml  Output --  Net 837 ml         Physical Exam: Vital Signs Blood pressure (!) 145/57, pulse 67, temperature 98.8 F (37.1 C), temperature source Oral, resp. rate 17, height 5\' 6"  (1.676 m), weight 69.1 kg, SpO2 99%.   General: No acute distress, appears uncomfortable in bed Mood and affect are appropriate Heart: Regular rate and rhythm no rubs murmurs or extra sounds Lungs: Clear to auscultation, breathing unlabored, no rales or wheezes Abdomen: Positive bowel sounds, soft nontender to palpation, nondistended Extremities: BUE and LLE No clubbing, cyanosis, or edema  Psych: Pt's affect is appropriate. Pt is cooperative Skin: Right groin wound with vac, small amount of serosanguinous fluid in canister, Right AKA, medial thigh incisions well approximated with staples.  mild s/s drainage, ecchymoses and blistering medially around incision and proximally on thigh Left foot with plantar callus , mildly tender Neuro:  Alert and oriented x 3. Normal insight and awareness. Intact Memory. Normal language and speech. Cranial nerve exam unremarkable. MMT: 5/5 BUE. Musculoskeletal: right AK sensitive to touch.  swollen Exam unchanged 6/10  Assessment/Plan: 1. Functional deficits which require 3+ hours per day of interdisciplinary therapy in a comprehensive  inpatient rehab setting. Physiatrist is providing close team supervision and 24 hour management of active medical problems listed below. Physiatrist and rehab team continue to assess barriers to discharge/monitor patient progress toward functional and medical goals  Care Tool:  Bathing    Body parts bathed by patient: Right arm, Left arm, Chest, Abdomen, Front perineal area, Buttocks, Right upper leg, Left upper leg, Left lower leg, Face     Body parts n/a: Right lower leg   Bathing assist Assist Level: Contact Guard/Touching assist     Upper Body Dressing/Undressing Upper body dressing   What is the patient wearing?: Pull over shirt    Upper body assist Assist Level: Set up assist    Lower Body Dressing/Undressing Lower body dressing      What is the patient wearing?: Pants     Lower body assist Assist for lower body dressing: Contact Guard/Touching assist     Toileting Toileting    Toileting assist Assist for toileting: Supervision/Verbal cueing     Transfers Chair/bed transfer  Transfers assist  Chair/bed transfer activity did not occur: Safety/medical concerns  Chair/bed transfer assist level: Supervision/Verbal cueing     Locomotion Ambulation   Ambulation assist      Assist level: Contact Guard/Touching  assist Assistive device: Walker-rolling Max distance: 92ft   Walk 10 feet activity   Assist     Assist level: Contact Guard/Touching assist Assistive device: Walker-rolling   Walk 50 feet activity   Assist Walk 50 feet with 2 turns activity did not occur: Safety/medical concerns (pain/fatigue/lightheadedness)         Walk 150 feet activity   Assist Walk 150 feet activity did not occur: Safety/medical concerns         Walk 10 feet on uneven surface  activity   Assist Walk 10 feet on uneven surfaces activity did not occur: Safety/medical concerns         Wheelchair     Assist Is the patient using a wheelchair?:  Yes Type of Wheelchair: Manual    Wheelchair assist level: Independent Max wheelchair distance: 150    Wheelchair 50 feet with 2 turns activity    Assist        Assist Level: Independent   Wheelchair 150 feet activity     Assist      Assist Level: Independent   Blood pressure (!) 145/57, pulse 67, temperature 98.8 F (37.1 C), temperature source Oral, resp. rate 17, height 5\' 6"  (1.676 m), weight 69.1 kg, SpO2 99%.  Medical Problem List and Plan: 1. Functional deficits secondary to right AKA  and I&D groin with sartorius muscle flap 05/31/2023 after failed revascularization procedure             -patient may not shower while wound vac is in place             -ELOS/Goals: 8-11 days S             -Continue PT, OT    2.  Antithrombotics: -DVT/anticoagulation:  Pharmaceutical: Heparin              -antiplatelet therapy: continue Aspirin  81 mg daily   3. Pain Management: continue Neurontin  300 mg twice daily, oxycodone  as needed  add low dose oxycontin  at noc- was helpful with sleeping throught the night , would anticipate using only during CIR stay  6/9 do no see a specific medication for sleep, I think she got oxycodone  that helped her sleep quickly, continue current regimen for now   6/10 Will change PRN oxycodone  to Q4h 4. Mood/Behavior/Sleep: Provide emotional support             -antipsychotic agents: N/A   5. Neuropsych/cognition: This patient is capable of making decisions on her own behalf.   6. Skin/Wound Care:  6/5-Wound VAC right groin--per surgery change on Monday by WOC RN.        -continue dry dressing to AKA incision, monitor blistering   7. Fluids/Electrolytes/Nutrition: Routine follow-up and outs follow-up chemistries   6/5-mild hyponatremia- 134 (sl improvement), continue to monitor  -albumin  only 1.9--->protein supps daily  6/9 Na up to 137- monitor  8.  Postoperative anemia.  Follow-up CBC   9.ID/Sepsis. Group A Streptococcus vascular graft  infection/bacteremia.  Daptomycin  changed to vancomycin  due to elevated CK.  Follow-up infectious disease.  Await long-term plan of care on antibiotic   6/5- wbcs fairly stable at 14k       -CK ordered for Friday-603 6/9 improved  ID note on 6/4- Vancomycin  through 07/12/23 10.  Hypertension.  BP reviewed: currently systolic is elevated and diastolic is soft: Continue Avapro  75 mg daily, Norvasc  10 mg daily, hydralazine  25 mg every 8 hours, HCTZ 12.5 mg daily.  Monitor with increased mobility  6/10 BP  a little labile, continue current regimen for now, would like to avoid causing hypotension    Vitals:   06/12/23 2103 06/13/23 0501  BP: (!) 153/53 (!) 145/57  Pulse: 65 67  Resp: 16 17  Temp: 98.7 F (37.1 C) 98.8 F (37.1 C)  SpO2: 100% 99%    11.  Hyperlipidemia.  Will discuss holding Crestor  with pharmacy due to elevated CK.   12. Hyperglycemia: hemoglobin A1c reviewed and is lower than normal, continue to monitor 13. ABLA:  -hgb 8.6. may have reached nadir--recheck Monday   -Fe++ supp  -6/9 hgb stable at 8.7 14. Slow transit constipation?  6/5-?BM on 6/3. pt seems to be struggling to have BM this morning  -f/u with nursing  -add daily miralax  and senna at night  -6/9 Pt reports BM yesterday, continue to follow  -6/10 Pt reports BM yesterday, do not see documented, add sorbitol PRN     LOS: 6 days A FACE TO FACE EVALUATION WAS PERFORMED  Katherine Archer 06/13/2023, 10:36 AM

## 2023-06-13 NOTE — Progress Notes (Signed)
 Occupational Therapy Session Note  Patient Details  Name: Katherine Archer MRN: [id number] Date of Birth: 1955/03/10  Today's Date: 06/13/2023 OT Individual Time: 1030-1141 OT Individual Time Calculation (min): 71 min    Short Term Goals: Week 1:  OT Short Term Goal 1 (Week 1): STGs=LTGs due to patient's estimated length of stay.   Skilled Therapeutic Interventions/Progress Updates:    Pt bed level at time of session, OT donning/doffing PPE as needed. Pt stating she took herself to the bathroom last night, recommended waiting for staff 2/2 safety but pt states she needs to go at that time and can't wait. Bed mobility throughout session supine <> sit with MOD I, ambulating bed <> bathroom > sink with Supervision and using RW, OT providing supervision to recommend wound vac placement. Note pt does tasks with little assistance but some decreased safety noted - determined to perform tasks. 3/3 toileting tasks and transfer all with supervision. Standing at sink for hand hygiene same manner. Self propelling in wheelchair with Supervision for uneven ground but MOD I for straight and even pathways, practiced straightaways, on/off elevators, around obstacles, turning 180* and 90*, etc. Back in room scoot transfer supervision. Alarm on call bell in reach wound vac plugged in.  Therapy Documentation Precautions:  Precautions Precautions: Fall Recall of Precautions/Restrictions: Intact Precaution/Restrictions Comments: R AKA, wound vac, RUE PICC-IV antibiotics Restrictions Weight Bearing Restrictions Per Provider Order: Yes RLE Weight Bearing Per Provider Order: Non weight bearing     Therapy/Group: Individual Therapy  Doroteo Gasmen 06/13/2023, 7:29 AM

## 2023-06-13 NOTE — Progress Notes (Signed)
 Occupational Therapy Session Note  Patient Details  Name: Smrithi Pigford MRN: [id number] Date of Birth: 1955-10-19  Session 1: {CHL IP REHAB OT TIME CALCULATIONS:304400400} Session 2: {CHL IP REHAB OT TIME CALCULATIONS:304400400}  Short Term Goals: {OT XBJ:[id number]}  Skilled Therapeutic Interventions/Progress Updates:    Session 1: Patient agreeable to participate in OT session. Reports *** pain level.   Patient participated in skilled OT session focusing on ***. Therapist facilitated/assessed/developed/educated/integrated/elicited *** in order to improve/facilitate/promote   Session 2: Completed family training with ***. Therapist educated family regarding ***. Education also provided on strategies and compensatory techniques to use if ***. Education provided for gait belt use and handling technique. *** completed hands on training for *** and *** transfer with therapist providing VC as needed for technique and form. All education completed and all questions    Therapy Documentation Precautions:  Precautions Precautions: Fall Recall of Precautions/Restrictions: Intact Precaution/Restrictions Comments: R AKA, wound vac, RUE PICC-IV antibiotics Restrictions Weight Bearing Restrictions Per Provider Order: Yes RLE Weight Bearing Per Provider Order: Non weight bearing    Therapy/Group: Individual Therapy  Carollee Circle, OTR/L,CBIS  Supplemental OT - [facility] and [facility] Secure Chat Preferred   06/13/2023, 9:36 PM

## 2023-06-13 NOTE — Progress Notes (Addendum)
 Patient ID: Katherine Archer, female   DOB: 04/04/1955, 68 y.o.   MRN: [id number] Hearing from team pt will be ready to go home this week. Have let [facility] know discharge 6/12 so can education daughter on IV antibiotics for home and also to make sure home health can get out to see her in a timely manner  3:00 PM Now needs wound vac will fax order to [employer] to start insurance auth. Will make sure home health is aware of this need and has nursing coverage for this along with IV antibiotics

## 2023-06-13 NOTE — Plan of Care (Signed)
  Problem: RH BOWEL ELIMINATION Goal: RH STG MANAGE BOWEL WITH ASSISTANCE Description: STG Manage Bowel with supervision Assistance. Outcome: Progressing   Problem: RH BLADDER ELIMINATION Goal: RH STG MANAGE BLADDER WITH ASSISTANCE Description: STG Manage Bladder With supervision  Assistance Outcome: Progressing   Problem: RH SKIN INTEGRITY Goal: RH STG SKIN FREE OF INFECTION/BREAKDOWN Description: Manage skin free of infection/breakdown with supervision assistance Outcome: Progressing

## 2023-06-14 ENCOUNTER — Other Ambulatory Visit: Payer: Self-pay

## 2023-06-14 ENCOUNTER — Other Ambulatory Visit (HOSPITAL_COMMUNITY): Payer: Self-pay

## 2023-06-14 MED ORDER — EZETIMIBE 10 MG PO TABS
10.0000 mg | ORAL_TABLET | Freq: Every day | ORAL | 0 refills | Status: DC
Start: 2023-06-14 — End: 2023-07-10
  Filled 2023-06-14: qty 30, 30d supply, fill #0

## 2023-06-14 MED ORDER — IRBESARTAN 75 MG PO TABS
75.0000 mg | ORAL_TABLET | Freq: Every day | ORAL | 0 refills | Status: DC
Start: 2023-06-15 — End: 2023-07-04
  Filled 2023-06-14: qty 30, 30d supply, fill #0

## 2023-06-14 MED ORDER — FERROUS SULFATE 325 (65 FE) MG PO TABS
325.0000 mg | ORAL_TABLET | Freq: Every day | ORAL | 0 refills | Status: DC
  Filled 2023-06-14: qty 30, 30d supply, fill #0

## 2023-06-14 MED ORDER — OXYCODONE HCL ER 10 MG PO T12A
10.0000 mg | EXTENDED_RELEASE_TABLET | Freq: Every day | ORAL | 0 refills | Status: DC
  Filled 2023-06-14: qty 14, 14d supply, fill #0

## 2023-06-14 MED ORDER — ASCORBIC ACID 500 MG PO TABS
250.0000 mg | ORAL_TABLET | Freq: Two times a day (BID) | ORAL | 0 refills | Status: DC
Start: 2023-06-14 — End: 2023-07-10
  Filled 2023-06-14: qty 30, 30d supply, fill #0

## 2023-06-14 MED ORDER — VANCOMYCIN HCL 1250 MG/250ML IV SOLN
1250.0000 mg | Freq: Every day | INTRAVENOUS | Status: DC
Start: 2023-06-15 — End: 2023-07-04

## 2023-06-14 MED ORDER — AMLODIPINE BESYLATE 10 MG PO TABS
10.0000 mg | ORAL_TABLET | Freq: Every day | ORAL | 0 refills | Status: DC
  Filled 2023-06-14: qty 30, 30d supply, fill #0

## 2023-06-14 MED ORDER — ACETAMINOPHEN 325 MG PO TABS: 650.0000 mg | ORAL_TABLET | Freq: Four times a day (QID) | ORAL | Status: DC | PRN

## 2023-06-14 MED ORDER — SENNA 8.6 MG PO TABS
1.0000 | ORAL_TABLET | Freq: Every day | ORAL | Status: DC
  Administered 2023-06-14: 8.6 mg via ORAL
  Filled 2023-06-14: qty 1

## 2023-06-14 MED ORDER — HYDROCHLOROTHIAZIDE 12.5 MG PO TABS
12.5000 mg | ORAL_TABLET | Freq: Every day | ORAL | 0 refills | Status: DC
  Filled 2023-06-14: qty 30, 30d supply, fill #0

## 2023-06-14 MED ORDER — GABAPENTIN 300 MG PO CAPS
300.0000 mg | ORAL_CAPSULE | Freq: Two times a day (BID) | ORAL | 0 refills | Status: DC
  Filled 2023-06-14: qty 60, 30d supply, fill #0

## 2023-06-14 MED ORDER — ZINC SULFATE 220 (50 ZN) MG PO TABS
220.0000 mg | ORAL_TABLET | Freq: Every day | ORAL | 0 refills | Status: DC
Start: 2023-06-15 — End: 2023-07-13
  Filled 2023-06-14: qty 30, 30d supply, fill #0

## 2023-06-14 MED ORDER — OXYCODONE HCL 5 MG PO TABS
5.0000 mg | ORAL_TABLET | ORAL | 0 refills | Status: DC | PRN
  Filled 2023-06-14: qty 30, 5d supply, fill #0

## 2023-06-14 MED ORDER — HYDRALAZINE HCL 25 MG PO TABS
25.0000 mg | ORAL_TABLET | Freq: Three times a day (TID) | ORAL | 0 refills | Status: DC
Start: 2023-06-14 — End: 2023-07-10
  Filled 2023-06-14: qty 90, 30d supply, fill #0

## 2023-06-14 MED ORDER — VANCOMYCIN IV (FOR PTA / DISCHARGE USE ONLY): 1250.0000 mg | INTRAVENOUS | 0 refills | Status: DC

## 2023-06-14 NOTE — Progress Notes (Signed)
 Inpatient Rehabilitation Care Coordinator Discharge Note   Patient Details  Name: Katherine Archer MRN: [id number] Date of Birth: 06-20-1955   Discharge location: TRANSFERRED TO ACUTE AFTER I & D  Length of Stay: 8 DAYS  Discharge activity level: MOD/I LEVEL  Home/community participation: ACTIVE  Patient response NW:GNFAOZ Literacy - How often do you need to have someone help you when you read instructions, pamphlets, or other written material from your doctor or pharmacy?: Never  Patient response HY:QMVHQI Isolation - How often do you feel lonely or isolated from those around you?: Rarely  Services provided included: MD, RD, PT, OT, RN, CM, TR, SW  Financial Services:  Field seismologist Utilized: Physiological scientist MEDICARE  Choices offered to/list presented to: PT AND DAUGHTER  Follow-up services arranged:  Home Health, DME, Patient/Family has no preference for HH/DME agencies Home Health Agency: [facility]  PT  OT  RN    DME : [facility]   [facility]    Patient response to transportation need: Is the patient able to respond to transportation needs?: Yes In the past 12 months, has lack of transportation kept you from medical appointments or from getting medications?: No In the past 12 months, has lack of transportation kept you from meetings, work, or from getting things needed for daily living?: No   Patient/Family verbalized understanding of follow-up arrangements:  Yes  Individual responsible for coordination of the follow-up plan: LUZ-DAUGHTER [phone]  Confirmed correct DME delivered: Mardell Shade 06/14/2023    Comments (or additional information):DAUGHTER AND GRANDDAUGHTER WERE HERE FOR EDUCATION. BOTH FEEL COMFORTABLE WITH DC HOME  Summary of Stay    Date/Time Discharge Planning CSW  06/14/23 0916 HOme with daughter who is coming today for family education. Will need education on IV antibitoics and wound vac  ordered awaiting insurance approval. RGD       Christene Pounds, Maralee Senate

## 2023-06-14 NOTE — Patient Care Conference (Addendum)
 Inpatient RehabilitationTeam Conference and Plan of Care Update Date: 06/14/2023   Time: 1209 pm     Patient Name: Katherine Archer      Medical Record Number: [id number]  Date of Birth: Mar 03, 1955 Sex: Female         Room/Bed: 4M13C/4M13C-01 Payor Info: Payor: [employer] MEDICARE / Plan: Raina Bunting MEDICARE HMO/PPO / Product Type: *No Product type* /    Admit Date/Time:  06/07/2023  5:27 PM  Primary Diagnosis:  Right above-knee amputee [facility])  Hospital Problems: Principal Problem:   Right above-knee amputee [facility]) Active Problems:   Protein-calorie malnutrition, severe    Expected Discharge Date: Expected Discharge Date: 06/15/23  Team Members Present: Physician leading conference: Dr. Lylia Sand Social Worker Present: Adrianna Albee, LCSW Nurse Present: Jerene Monks, RN PT Present: Jenney Modest, PT OT Present: Carollee Circle, OT PPS Coordinator present : Jestine Moron, SLP     Current Status/Progress Goal Weekly Team Focus  Bowel/Bladder      Continent of bowel and bladder    Maintain continence of bowel and bladder    Assess continence of bowel and bladder  Swallow/Nutrition/ Hydration               ADL's   Setup for UB, CGA for LB, CGA step/hop transfers   Mod I   family training    Mobility   bed mobility mod I, sit to stand mod I, stand pivot transfer RW and mod I. Squat pivot transfer supervision/mod I, gait x 25+ feet with RW and supervision/mod I, stair navigation x5 6 inch steps with hop to gait and CGA   supervision/mod I  D/C 6/12 barriers to discharge IV antibiotics and wound vac, and pt discharge destination  follow up: HHPT and HH nursing, DME needs 16x16 WC, pt has RW    Building services engineer Observations               Pain      Rt aka incision    <4 w/ prns    Assess pain q shift  Skin      Incision site lt aka Incision noted with blistering  Wound vac right groin    Incision/wound vac free  from infection     Assess wound vac and incision site q shift    Discharge Planning:  HOme with daughter who is coming today for family education. Will need education on IV antibitoics and wound vac ordered awaiting insurance approval.    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 Patient limited by pain, wound vac and IV antibiotics til 07/12/23.  Patient on target to meet rehab goals: yes, currently patient requires set up assist for upper body care and CGA for lower body care. Patient transfers with supervision/mod I squat pivot and able to ambulate 25' using a rolling walker with supervision/mod I assistance. Family educated on incision/wound care dressing changes.  Overall goals for discharge are set for supervision/mod I assistance.  See Care Plan and progress notes for long and short-term goals.   Revisions to Treatment Plan:  Wound vac IV antibiotics   Teaching Needs: Safety, transfers, toileting, medications, wound vac , incision care and dressing changes, dietary modifications, etc.   Current Barriers to Discharge: Decreased caregiver support, Home enviroment access/layout, IV antibiotics, Wound care, and wound vac  Possible Resolutions to Barriers: Family education Home health follow-up DME: W/C,[facility]- wound vac  Medical Summary Current Status: AKA, wound, pain, HTN, ABX, constipation,  Barriers to Discharge: Complicated Wound;Medical stability;Self-care education  Barriers to Discharge Comments: AKA, wound, pain, HTN, ABX, constipation, Possible Resolutions to [employer] Focus: wound vac, IV abx, monitor bowel function, pain control   Continued Need for Acute Rehabilitation Level of Care: The patient requires daily medical management by a physician with specialized training in physical medicine and rehabilitation for the following reasons: Direction of a multidisciplinary physical rehabilitation program to  maximize functional independence : Yes Medical management of patient stability for increased activity during participation in an intensive rehabilitation regime.: Yes Analysis of laboratory values and/or radiology reports with any subsequent need for medication adjustment and/or medical intervention. : Yes   I attest that I was present, lead the team conference, and concur with the assessment and plan of the team.   Anely Spiewak Gayo 06/14/2023, 1209 pm

## 2023-06-14 NOTE — Progress Notes (Signed)
 Inpatient Rehabilitation Discharge Medication Review by a Pharmacist  A complete drug regimen review was completed for this patient to identify any potential clinically significant medication issues.  High Risk Drug Classes Is patient taking? Indication by Medication  Antipsychotic No   Anticoagulant No   Antibiotic Yes, as an intravenous medication IV Vancomycin  - strep infection, MRSA infection,  end date 7/9  Opioid Yes Oxycodone  - pain  Antiplatelet No   Hypoglycemics/insulin  No   Vasoactive Medication Yes Irbesartan , hydralazine , amlodipine , HCTZ- BP  Chemotherapy No   Other Yes Ferrous sulfate  - anemia  Zinc  - nutrition Ezetimibe  - cholesterol  Gabapentin  - pain Aledronate - osteoporosis      Type of Medication Issue Identified Description of Issue Recommendation(s)  Drug Interaction(s) (clinically significant)     Duplicate Therapy     Allergy     No Medication Administration End Date     Incorrect Dose     Additional Drug Therapy Needed     Significant med changes from prior encounter (inform family/care partners about these prior to discharge).    Other       Clinically significant medication issues were identified that warrant physician communication and completion of prescribed/recommended actions by midnight of the next day:  No  Name of provider notified for urgent issues identified:   Provider Method of Notification:     Pharmacist comments: None  Time spent performing this drug regimen review (minutes):  20 minutes  Thank you. Lennice Quivers, PharmD

## 2023-06-14 NOTE — Progress Notes (Signed)
 PROGRESS NOTE   Subjective/Complaints: Pain controlled today. Vascular was contacted will evaluate wound vac tomorrow before discharge. Pain controlled. LBM yesterday- loose   ROS: Patient denies Chills, HA, Abdominal pain, N/V, Chest pain, SOB  Objective:   US  EKG Site Rite Result Date: 06/14/2023 If Site Rite image not attached, placement could not be confirmed due to current cardiac rhythm.  Recent Labs    06/12/23 0518  WBC 8.4  HGB 8.7*  HCT 27.2*  PLT 392    Recent Labs    06/12/23 0518  NA 137  K 4.0  CL 104  CO2 26  GLUCOSE 96  BUN 9  CREATININE 0.62  CALCIUM  9.2     Intake/Output Summary (Last 24 hours) at 06/14/2023 1210 Last data filed at 06/13/2023 1800 Gross per 24 hour  Intake 600 ml  Output --  Net 600 ml         Physical Exam: Vital Signs Blood pressure (!) 148/51, pulse 61, temperature 98 F (36.7 C), temperature source Oral, resp. rate 16, height 5' 6 (1.676 m), weight 62 kg, SpO2 100%.   Spoke with her with help of interpreter-spanish   General: No acute distress, appears uncomfortable in bed Mood and affect are appropriate Heart: Regular rate and rhythm no rubs murmurs or extra sounds Lungs: Clear to auscultation, breathing unlabored, no rales or wheezes Abdomen: Positive bowel sounds, soft nontender to palpation, nondistended Extremities: BUE and LLE No clubbing, cyanosis, or edema  Psych: Pt's affect is appropriate. Pt is cooperative Skin: Right groin wound with vac, small amount of serosanguinous fluid in canister, Right AKA, medial thigh incisions well approximated with staples.  mild s/s drainage, ecchymoses and blistering medially around incision and proximally on thigh Left foot with plantar callus , mildly tender Neuro:  Alert and oriented x 3. Normal insight and awareness. Intact Memory. Normal language and speech. Cranial nerve exam unremarkable. MMT: 5/5 BUE.  Musculoskeletal: right AK sensitive to touch.  swollen Exam unchanged 6/11  Assessment/Plan: 1. Functional deficits which require 3+ hours per day of interdisciplinary therapy in a comprehensive inpatient rehab setting. Physiatrist is providing close team supervision and 24 hour management of active medical problems listed below. Physiatrist and rehab team continue to assess barriers to discharge/monitor patient progress toward functional and medical goals  Care Tool:  Bathing    Body parts bathed by patient: Right arm, Left arm, Chest, Abdomen, Front perineal area, Buttocks, Right upper leg, Left upper leg, Left lower leg, Face     Body parts n/a: Right lower leg   Bathing assist Assist Level: Contact Guard/Touching assist     Upper Body Dressing/Undressing Upper body dressing   What is the patient wearing?: Pull over shirt    Upper body assist Assist Level: Set up assist    Lower Body Dressing/Undressing Lower body dressing      What is the patient wearing?: Pants     Lower body assist Assist for lower body dressing: Contact Guard/Touching assist     Toileting Toileting    Toileting assist Assist for toileting: Supervision/Verbal cueing     Transfers Chair/bed transfer  Transfers assist  Chair/bed transfer activity did not occur:  Safety/medical concerns  Chair/bed transfer assist level: Supervision/Verbal cueing     Locomotion Ambulation   Ambulation assist      Assist level: Contact Guard/Touching assist Assistive device: Walker-rolling Max distance: 50ft   Walk 10 feet activity   Assist     Assist level: Contact Guard/Touching assist Assistive device: Walker-rolling   Walk 50 feet activity   Assist Walk 50 feet with 2 turns activity did not occur: Safety/medical concerns (pain/fatigue/lightheadedness)         Walk 150 feet activity   Assist Walk 150 feet activity did not occur: Safety/medical concerns         Walk 10 feet  on uneven surface  activity   Assist Walk 10 feet on uneven surfaces activity did not occur: Safety/medical concerns         Wheelchair     Assist Is the patient using a wheelchair?: Yes Type of Wheelchair: Manual    Wheelchair assist level: Independent Max wheelchair distance: 150    Wheelchair 50 feet with 2 turns activity    Assist        Assist Level: Independent   Wheelchair 150 feet activity     Assist      Assist Level: Independent   Blood pressure (!) 148/51, pulse 61, temperature 98 F (36.7 C), temperature source Oral, resp. rate 16, height 5' 6 (1.676 m), weight 62 kg, SpO2 100%.  Medical Problem List and Plan: 1. Functional deficits secondary to right AKA  and I&D groin with sartorius muscle flap 05/31/2023 after failed revascularization procedure             -patient may not shower while wound vac is in place             -ELOS/Goals: 8-11 days S             -Continue PT, OT  -Team conference today please see physician documentation under team conference tab, met with team  to discuss problems,progress, and goals. Formulized individual treatment plan based on medical history, underlying problem and comorbidities.      2.  Antithrombotics: -DVT/anticoagulation:  Pharmaceutical: Heparin              -antiplatelet therapy: continue Aspirin  81 mg daily   3. Pain Management: continue Neurontin  300 mg twice daily, oxycodone  as needed  add low dose oxycontin  at noc- was helpful with sleeping throught the night , would anticipate using only during CIR stay  6/9 do no see a specific medication for sleep, I think she got oxycodone  that helped her sleep quickly, continue current regimen for now   6/10 Will change PRN oxycodone  to Q4h  6/11 pain controlled, continue current regimen for now 4. Mood/Behavior/Sleep: Provide emotional support             -antipsychotic agents: N/A   5. Neuropsych/cognition: This patient is capable of making decisions on  her own behalf.   6. Skin/Wound Care:  6/5-Wound VAC right groin--per surgery change on Monday by WOC RN.        -continue dry dressing to AKA incision, monitor blistering   7. Fluids/Electrolytes/Nutrition: Routine follow-up and outs follow-up chemistries   6/5-mild hyponatremia- 134 (sl improvement), continue to monitor  -albumin  only 1.9--->protein supps daily  6/9 Na up to 137- monitor  8.  Postoperative anemia.  Follow-up CBC   9.ID/Sepsis. Group A Streptococcus vascular graft infection/bacteremia.  Daptomycin  changed to vancomycin  due to elevated CK.  Follow-up infectious disease.  Await long-term plan of  care on antibiotic   6/5- wbcs fairly stable at 14k       -CK ordered for Friday-603 6/9 improved  ID note on 6/4- Vancomycin  through 07/12/23 10.  Hypertension.  BP reviewed: currently systolic is elevated and diastolic is soft: Continue Avapro  75 mg daily, Norvasc  10 mg daily, hydralazine  25 mg every 8 hours, HCTZ 12.5 mg daily.  Monitor with increased mobility  6/10-11 BP a little labile, continue current regimen for now, would like to avoid causing hypotension    Vitals:   06/14/23 1309 06/14/23 1418  BP: (!) 138/45 (!) 139/42  Pulse:  69  Resp:  16  Temp:  98.2 F (36.8 C)  SpO2:  100%    11.  Hyperlipidemia.  Will discuss holding Crestor  with pharmacy due to elevated CK.   12. Hyperglycemia: hemoglobin A1c reviewed and is lower than normal, continue to monitor 13. ABLA:  -hgb 8.6. may have reached nadir--recheck Monday   -Fe++ supp  -6/9 hgb stable at 8.7 14. Slow transit constipation?  6/5-?BM on 6/3. pt seems to be struggling to have BM this morning  -f/u with nursing  -add daily miralax  and senna at night  -6/9 Pt reports BM yesterday, continue to follow  -6/10 Pt reports BM yesterday, do not see documented, add sorbitol PRN  -6/11 LBM yesterday loose- will decrease senokot to 1 tab     LOS: 7 days A FACE TO FACE EVALUATION WAS PERFORMED  Lylia Sand 06/14/2023, 12:10 PM

## 2023-06-14 NOTE — Progress Notes (Signed)
 Occupational Therapy Discharge Summary  Patient Details  Name: Katherine Archer MRN: [id number] Date of Birth: 1955/10/25  Date of Discharge from OT service:June 14, 2023     Patient has met 8 of 8 long term goals due to improved activity tolerance, improved balance, postural control, ability to compensate for deficits, improved attention, improved awareness, and improved coordination.  Patient to discharge at overall Modified Independent level.  Patient's care partner is independent to provide the necessary physical assistance at discharge.    Reasons goals not met: n/a  Recommendation:  Patient will benefit from ongoing skilled OT services in home health setting to continue to advance functional skills in the area of BADL and Reduce care partner burden.  Equipment: TTB, manual WC  Reasons for discharge: treatment goals met and discharge from hospital  Patient/family agrees with progress made and goals achieved: Yes  OT Discharge Precautions/Restrictions  Precautions Precautions: Fall Recall of Precautions/Restrictions: Intact Precaution/Restrictions Comments: R AKA, wound vac, RUE PICC-IV antibiotics Restrictions Weight Bearing Restrictions Per Provider Order: Yes RLE Weight Bearing Per Provider Order: Non weight bearing Pain Pain Assessment Pain Scale: 0-10 Pain Score: 9  Pain Location: Leg Pain Orientation: Right Pain Descriptors / Indicators: Aching;Discomfort Pain Intervention(s): Medication (See eMAR) ADL ADL Eating: Independent Where Assessed-Eating: Bed level, Chair Grooming: Independent Where Assessed-Grooming: Wheelchair Upper Body Bathing: Modified independent Where Assessed-Upper Body Bathing: Wheelchair Lower Body Bathing: Modified independent Where Assessed-Lower Body Bathing: Wheelchair Upper Body Dressing: Modified independent (Device) Where Assessed-Upper Body Dressing: Wheelchair Lower Body Dressing: Modified independent Where Assessed-Lower Body  Dressing: Wheelchair Toileting: Modified independent Where Assessed-Toileting: Toilet, Psychiatrist Transfer: Modified independent Statistician Method: Proofreader: Bedside commode, Engineer, technical sales: Not assessed Film/video editor: Not assessed  Perception  Perception: Within Functional Limits Praxis Praxis: WFL Cognition Cognition Overall Cognitive Status: Within Functional Limits for tasks assessed Arousal/Alertness: Awake/alert Memory: Appears intact Awareness: Appears intact Problem Solving: Appears intact Safety/Judgment: Appears intact Brief Interview for Mental Status (BIMS) Repetition of Three Words (First Attempt): 3 Temporal Orientation: Year: Correct Temporal Orientation: Month: Accurate within 5 days Temporal Orientation: Day: Correct Recall: Sock: Yes, no cue required Recall: Blue: Yes, no cue required Recall: Bed: Yes, no cue required BIMS Summary Score: 15 Sensation Sensation Light Touch: Impaired Detail Peripheral sensation comments: LLE numbness, 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 Light Touch Impaired Details: Impaired RUE;Impaired LLE Hot/Cold: Appears Intact Proprioception: Impaired by gross assessment Coordination Gross Motor Movements are Fluid and Coordinated: No Fine Motor Movements are Fluid and Coordinated: Yes Coordination and Movement Description: Deficits due to R AKA. Motor  Motor Motor: Within Functional Limits Mobility  Bed Mobility Supine to Sit: Independent with assistive device Sit to Supine: Independent with assistive device Transfers Sit to Stand: Independent with assistive device Stand to Sit: Independent with assistive device  Trunk/Postural Assessment  Cervical Assessment Cervical Assessment: Exceptions to [facility] (forward head) Thoracic Assessment Thoracic Assessment: Exceptions to [facility] (rounded shoulders) Lumbar Assessment Lumbar Assessment:  Exceptions to [facility] (posterior pelvic tilt) Postural Control Postural Control: Within Functional Limits  Balance Balance Balance Assessed: Yes Static Sitting Balance Static Sitting - Balance Support: Feet supported Static Sitting - Level of Assistance: 7: Independent Dynamic Sitting Balance Dynamic Sitting - Balance Support: During functional activity Dynamic Sitting - Level of Assistance: 6: Modified independent (Device/Increase time) Static Standing Balance Static Standing - Balance Support: Bilateral upper extremity supported;During functional activity Static Standing - Level of Assistance: 6: Modified independent (Device/Increase time) Dynamic Standing Balance Dynamic  Standing - Balance Support: During functional activity;Bilateral upper extremity supported Dynamic Standing - Level of Assistance: 6: Modified independent (Device/Increase time) Extremity/Trunk Assessment RUE Assessment RUE Assessment: Within Functional Limits LUE Assessment LUE Assessment: Exceptions to [facility] Active Range of Motion (AROM) Comments: WFL in all ranges General Strength Comments: 4/4 grossly overall.   Carollee Circle, OTR/L,CBIS  Supplemental OT - [facility] and [facility] Secure Chat Preferred   06/15/2023, 8:30 AM

## 2023-06-14 NOTE — Progress Notes (Signed)
 Physical Therapy Session Note  Patient Details  Name: Katherine Archer MRN: [id number] Date of Birth: 1955-10-16  Today's Date: 06/14/2023 PT Individual Time: 0800-0908 PT Individual Time Calculation (min): 68 min   Short Term Goals: Week 1:  PT Short Term Goal 1 (Week 1): STG=LTG 2/2 ELOS  Skilled Therapeutic Interventions/Progress Updates:      Pt supine in bed upon arrival. Pt agreeable to therapy. Pt reports pain in residual limb, premedicated, improved with repositioning and desensitization techniques.   Interpretor present to translate.   Pt reports being very tired this morning 2/2 difficulty sleeping last night. Pt denies pain but reports discomfort throughout night and difficulty finding good position.    Pt able to provide teach back of need to perform skin inspection daily, pt able to provide teach back of signs and symptoms of infection/pressure injury and ischemia. Pt required reminder to ensure she perfors skin inspection not only on R LE but also L LE.   Nurse and PT presnt to change dressing and donn ace wrap.   Education provided for desensitization technique for management of pain. Pt performed and reports decrease in pain.   Reviewed and performed the following HEP: handout provided with spanish translation:   Access Code: [email or web address] URL: [email or web address] Date: 06/14/2023 Prepared by: Jenney Modest  Exercises - Wheelchair Push-Up (AKA)  - 1 x daily - 7 x weekly - 3 sets - 10 reps - Supine Single Leg Bridge with Sound Leg (AKA)  - 1 x daily - 7 x weekly - 3 sets - 10 reps - Prone Hip Extension with Residual Limb (AKA)  - 1 x daily - 7 x weekly - 3 sets - 10 reps - Sidelying Hip Abduction (AKA)  - 1 x daily - 7 x weekly - 3 sets - 10 reps - Sidelying Hip Abduction with Flexion and Extension (AKA)  - 1 x daily - 7 x weekly - 3 sets - 10 reps   Pt required increased time and multiple rest breaks 2/2 fatigue/pain.   Pt demos difficulty performing  prone hip extension, however able to tolerate prone position and sidelying hip extension. Education provided on importance of prone and supine positioning for prevention of hip flexor contraction.   Pt missed 7 min 2/2 pain/fatigue. Will attempt to make up missed time as able.   Pt supine in bed with all needs within reach and bed alarm on.        Therapy Documentation Precautions:  Precautions Precautions: Fall Recall of Precautions/Restrictions: Intact Precaution/Restrictions Comments: R AKA, wound vac, RUE PICC-IV antibiotics Restrictions Weight Bearing Restrictions Per Provider Order: Yes RLE Weight Bearing Per Provider Order: Non weight bearing  Therapy/Group: Individual Therapy  [facility], [place]den, DPT  06/14/2023, 7:59 AM

## 2023-06-14 NOTE — Progress Notes (Signed)
 Supplies for home wound care supplies given to family.

## 2023-06-14 NOTE — Progress Notes (Signed)
 Peripherally Inserted Central Catheter Placement  The IV Nurse has discussed with the patient and/or persons authorized to consent for the patient, the purpose of this procedure and the potential benefits and risks involved with this procedure.  The benefits include less needle sticks, lab draws from the catheter, and the patient may be discharged home with the catheter. Risks include, but not limited to, infection, bleeding, blood clot (thrombus formation), and puncture of an artery; nerve damage and irregular heartbeat and possibility to perform a PICC exchange if needed/ordered by physician.  Alternatives to this procedure were also discussed.  Bard Power PICC patient education guide, fact sheet on infection prevention and patient information card has been provided to patient /or left at bedside.    PICC Placement Documentation  PICC Single Lumen 06/14/23 Right Brachial 34 cm 0 cm (Active)  Indication for Insertion or Continuance of Line Home intravenous therapies (PICC only) 06/14/23 1354  Exposed Catheter (cm) 0 cm 06/14/23 1354  Site Assessment Clean, Dry, Intact 06/14/23 1354  Line Status Flushed;Saline locked;Blood return noted 06/14/23 1354  Dressing Type Transparent;Securing device 06/14/23 1354  Dressing Status Antimicrobial disc/dressing in place;Clean, Dry, Intact 06/14/23 1354  Line Care Connections checked and tightened 06/14/23 1354  Line Adjustment (NICU/IV Team Only) No 06/14/23 1354  Dressing Intervention New dressing;Adhesive placed at insertion site (IV team only) 06/14/23 1354  Dressing Change Due 06/21/23 06/14/23 1354    PICC Exchanged   Katherine Archer 06/14/2023, 1:55 PM

## 2023-06-14 NOTE — Progress Notes (Addendum)
 Patient ID: Geniene List, female   DOB: 1955-09-25, 68 y.o.   MRN: [id number]  Granddaughter present and speaks english to give team conference up date goals of supervision-mod/I and discharge date tomorrow 6/12. Awaiting wound vac approval and [facility] to educate daughter at 3:00 of IV antibiotics and for therapies to do stairs with. Have ordered wheelchair and in the room can not get bsc due to received one in 2021 and can only get every 5 years.. See daughter when here to see if any questions  3:18 PM Have delivered wound vac to take home to pt and daughter. Daughter has signed for it.

## 2023-06-15 ENCOUNTER — Other Ambulatory Visit (HOSPITAL_COMMUNITY): Payer: Self-pay

## 2023-06-15 DIAGNOSIS — S31109D Unspecified open wound of abdominal wall, unspecified quadrant without penetration into peritoneal cavity, subsequent encounter: Secondary | ICD-10-CM

## 2023-06-15 LAB — BASIC METABOLIC PANEL WITH GFR
Anion gap: 11 (ref 5–15)
BUN: 14 mg/dL (ref 8–23)
CO2: 26 mmol/L (ref 22–32)
Calcium: 8.8 mg/dL — ABNORMAL LOW (ref 8.9–10.3)
Chloride: 97 mmol/L — ABNORMAL LOW (ref 98–111)
Creatinine, Ser: 0.84 mg/dL (ref 0.44–1.00)
GFR, Estimated: >60 mL/min (ref >60)
Glucose, Bld: 119 mg/dL — ABNORMAL HIGH (ref 70–99)
Potassium: 4.1 mmol/L (ref 3.5–5.1)
Sodium: 134 mmol/L — ABNORMAL LOW (ref 135–145)

## 2023-06-15 LAB — CBC
HCT: 27.5 % — ABNORMAL LOW (ref 36.0–46.0)
Hemoglobin: 8.7 g/dL — ABNORMAL LOW (ref 12.0–15.0)
MCH: 30.5 pg (ref 26.0–34.0)
MCHC: 31.6 g/dL (ref 30.0–36.0)
MCV: 96.5 fL (ref 80.0–100.0)
Platelets: 367 10*3/uL (ref 150–400)
RBC: 2.85 MIL/uL — ABNORMAL LOW (ref 3.87–5.11)
RDW: 16.1 % — ABNORMAL HIGH (ref 11.5–15.5)
WBC: 7.6 10*3/uL (ref 4.0–10.5)
nRBC: 0 % (ref 0.0–0.2)

## 2023-06-15 MED ORDER — SENNA 8.6 MG PO TABS
2.0000 | ORAL_TABLET | Freq: Every day | ORAL | Status: DC
  Administered 2023-06-15: 17.2 mg via ORAL
  Filled 2023-06-15: qty 2

## 2023-06-15 MED ORDER — HYDRALAZINE HCL 25 MG PO TABS
37.5000 mg | ORAL_TABLET | Freq: Three times a day (TID) | ORAL | Status: DC
  Administered 2023-06-15: 37.5 mg via ORAL
  Filled 2023-06-15 (×2): qty 2

## 2023-06-15 NOTE — Progress Notes (Addendum)
 PROGRESS NOTE   Subjective/Complaints: Pt reports pain overall controlled. No new complaints. She was seen by vascular, R aka incision felt to be more nectrotic medially and exudate R groin.  Plan for OR tomorrow for debridement.    ROS: Patient denies fever, chills, HA, Abdominal pain, N/V, Chest pain, SOB  Objective:   US  EKG Site Rite Result Date: 06/14/2023 If Site Rite image not attached, placement could not be confirmed due to current cardiac rhythm.  No results for input(s): WBC, HGB, HCT, PLT in the last 72 hours.   No results for input(s): NA, K, CL, CO2, GLUCOSE, BUN, CREATININE, CALCIUM  in the last 72 hours.    Intake/Output Summary (Last 24 hours) at 06/15/2023 1325 Last data filed at 06/15/2023 1300 Gross per 24 hour  Intake 2190.05 ml  Output --  Net 2190.05 ml         Physical Exam: Vital Signs Blood pressure (!) 157/52, pulse 70, temperature 98.6 F (37 C), temperature source Oral, resp. rate 20, height 5' 6 (1.676 m), weight 62.4 kg, SpO2 100%.   Spoke with her with help of interpreter-spanish   General: No acute distress, appears uncomfortable in bed Mood and affect are appropriate Heart: Regular rate and rhythm no rubs murmurs or extra sounds Lungs: Clear to auscultation, breathing unlabored, no rales or wheezes Abdomen: Positive bowel sounds, soft nontender to palpation, nondistended Extremities: BUE and LLE No clubbing, cyanosis, or edema  Psych: Pt's affect is appropriate. Pt is cooperative Skin: Right groin wound wound vac has been removed,  Right AKA- with dry dressing- please see images from vascular surgery today       Neuro:  Alert and oriented x 3. Normal insight and awareness. Intact Memory. Normal language and speech. Cranial nerve exam unremarkable. MMT: 5/5 BUE. Musculoskeletal: right AK sensitive to touch.  swollen Prior neuro assessment is c/w  today's exam 06/15/2023.   Assessment/Plan: 1. Functional deficits which require 3+ hours per day of interdisciplinary therapy in a comprehensive inpatient rehab setting. Physiatrist is providing close team supervision and 24 hour management of active medical problems listed below. Physiatrist and rehab team continue to assess barriers to discharge/monitor patient progress toward functional and medical goals  Care Tool:  Bathing    Body parts bathed by patient: Right arm, Left arm, Chest, Abdomen, Front perineal area, Buttocks, Right upper leg, Left upper leg, Left lower leg, Face     Body parts n/a: Right lower leg   Bathing assist Assist Level: Independent with assistive device     Upper Body Dressing/Undressing Upper body dressing   What is the patient wearing?: Pull over shirt    Upper body assist Assist Level: Independent with assistive device    Lower Body Dressing/Undressing Lower body dressing      What is the patient wearing?: Pants     Lower body assist Assist for lower body dressing: Independent with assitive device     Toileting Toileting    Toileting assist Assist for toileting: Independent with assistive device     Transfers Chair/bed transfer  Transfers assist  Chair/bed transfer activity did not occur: Safety/medical concerns  Chair/bed transfer assist level: Independent with assistive  device     Locomotion Ambulation   Ambulation assist      Assist level: Independent with assistive device Assistive device: Walker-rolling Max distance: 30 ft   Walk 10 feet activity   Assist     Assist level: Independent with assistive device Assistive device: Walker-rolling   Walk 50 feet activity   Assist Walk 50 feet with 2 turns activity did not occur: Safety/medical concerns (fatigue)         Walk 150 feet activity   Assist Walk 150 feet activity did not occur: Safety/medical concerns         Walk 10 feet on uneven surface   activity   Assist Walk 10 feet on uneven surfaces activity did not occur: Safety/medical concerns   Assist level: Contact Guard/Touching assist Assistive device: Walker-rolling   Wheelchair     Assist Is the patient using a wheelchair?: Yes Type of Wheelchair: Manual    Wheelchair assist level: Independent Max wheelchair distance: 150    Wheelchair 50 feet with 2 turns activity    Assist        Assist Level: Independent   Wheelchair 150 feet activity     Assist      Assist Level: Independent   Blood pressure (!) 157/52, pulse 70, temperature 98.6 F (37 C), temperature source Oral, resp. rate 20, height 5' 6 (1.676 m), weight 62.4 kg, SpO2 100%.  Medical Problem List and Plan: 1. Functional deficits secondary to right AKA  and I&D groin with sartorius muscle flap 05/31/2023 after failed revascularization procedure             -patient may not shower while wound vac is in place             -ELOS/Goals: 8-11 days S             -Continue PT, OT  -OR tomorrow with vascular surgery for R groin debridement, discharge delayed due to this     2.  Antithrombotics: -DVT/anticoagulation:  Pharmaceutical: Heparin              -antiplatelet therapy: continue Aspirin  81 mg daily   3. Pain Management: continue Neurontin  300 mg twice daily, oxycodone  as needed  add low dose oxycontin  at noc- was helpful with sleeping throught the night , would anticipate using only during CIR stay  6/9 do no see a specific medication for sleep, I think she got oxycodone  that helped her sleep quickly, continue current regimen for now   6/10 Will change PRN oxycodone  to Q4h  6/11-12 pain controlled, continue current regimen for now 4. Mood/Behavior/Sleep: Provide emotional support             -antipsychotic agents: N/A   5. Neuropsych/cognition: This patient is capable of making decisions on her own behalf.   6. Skin/Wound Care:  6/5-Wound VAC right groin--per surgery change on  Monday by WOC RN.        -continue dry dressing to AKA incision, monitor blistering   7. Fluids/Electrolytes/Nutrition: Routine follow-up and outs follow-up chemistries   6/5-mild hyponatremia- 134 (sl improvement), continue to monitor  -albumin  only 1.9--->protein supps daily  6/9 Na up to 137- monitor  8.  Postoperative anemia.  Follow-up CBC   9.ID/Sepsis. Group A Streptococcus vascular graft infection/bacteremia.  Daptomycin  changed to vancomycin  due to elevated CK.  Follow-up infectious disease.  Await long-term plan of care on antibiotic   6/5- wbcs fairly stable at 14k       -CK ordered for  Friday-603 6/9 improved  ID note on 6/4- Vancomycin  through 07/12/23 10.  Hypertension.  BP reviewed: currently systolic is elevated and diastolic is soft: Continue Avapro  75 mg daily, Norvasc  10 mg daily, hydralazine  25 mg every 8 hours, HCTZ 12.5 mg daily.  Monitor with increased mobility  6/12 BP has been a little elevated, increase hydralazine  to 37.5mg  qh8    Vitals:   06/14/23 2113 06/15/23 0501  BP: (!) 147/53 (!) 157/52  Pulse:  70  Resp:  20  Temp:  98.6 F (37 C)  SpO2:  100%    11.  Hyperlipidemia.  Will discuss holding Crestor  with pharmacy due to elevated CK.   12. Hyperglycemia: hemoglobin A1c reviewed and is lower than normal, continue to monitor 13. ABLA:  -hgb 8.6. may have reached nadir--recheck Monday   -Fe++ supp  -6/9 hgb stable at 8.7  Recheck ordered 14. Slow transit constipation?  6/5-?BM on 6/3. pt seems to be struggling to have BM this morning  -f/u with nursing  -add daily miralax  and senna at night  -6/9 Pt reports BM yesterday, continue to follow  -6/10 Pt reports BM yesterday, do not see documented, add sorbitol PRN  -6/11 LBM yesterday loose- will decrease senokot to 1 tab  -6/12 LBM 2 days ago, will go back to 2 tabs senokot       LOS: 8 days A FACE TO FACE EVALUATION WAS PERFORMED  Katherine Archer 06/15/2023, 1:25 PM

## 2023-06-15 NOTE — Consult Note (Addendum)
 WOC Nurse wound follow up Vascular team is following for assessment and plan of care.  Refer to their note this AM; Vac has been discontinued at this time and the patient plans to go to the OR tomorrow for further debridement to right groin.    Please re-consult WOC team if further assistance is needed for Vac changes after the OR.  Thank-you,  Wiliam Harder MSN, RN, CWOCN, [place], CNS [phone]

## 2023-06-15 NOTE — Progress Notes (Addendum)
 Patient ID: Katherine Archer, female   DOB: [birth year], 68 y.o.   MRN: [id number]  According to MD pt is having an I & D and not discharging home today. Keep updated once hear anymore information  9:18 AM Hearing scheduled for I & D tomorrow not today. Told by MD and PA will stay here. Has been discharged from therapies met her goals.

## 2023-06-15 NOTE — Progress Notes (Signed)
 Physical Therapy Discharge Summary  Patient Details  Name: Katherine Archer MRN: [id number] Date of Birth: 1955-05-26  Date of Discharge from PT service:June 14, 2023  Patient has met 9 of 9 long term goals due to improved activity tolerance, improved balance, decreased pain, ability to compensate for deficits, improved attention, improved awareness, and improved coordination.  Patient to discharge at a wheelchair level wit short distance ambulation with RW Modified Independent. Pt requires CGA for stair navigation with B HR  Patient's care partner is independent to provide the necessary physical assistance at discharge.   Recommendation:  Patient will benefit from ongoing skilled PT services in home health setting to continue to advance safe functional mobility, address ongoing impairments in gait, balance, strength, endurance, and minimize fall risk.  Equipment: 16x16 WC, skin inspection mirror, pt has RW, recommended purchasing a reacher   Reasons for discharge: treatment goals met and discharge from hospital  Patient/family agrees with progress made and goals achieved: Yes  PT Discharge Precautions/Restrictions Precautions Precautions: Fall Recall of Precautions/Restrictions: Intact Precaution/Restrictions Comments: R AKA, wound vac, RUE PICC-IV antibiotics Restrictions Weight Bearing Restrictions Per Provider Order: Yes RLE Weight Bearing Per Provider Order: Non weight bearing Pain Interference Pain Interference Pain Effect on Sleep: 1. Rarely or not at all Pain Interference with Therapy Activities: 1. Rarely or not at all Pain Interference with Day-to-Day Activities: 2. 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 Light Touch Impaired Details: Impaired RUE;Impaired LLE Hot/Cold: Appears Intact Proprioception: Impaired by gross assessment Coordination Gross Motor Movements are Fluid and Coordinated: No Fine Motor Movements are Fluid and Coordinated: Yes Coordination and Movement Description: Deficits due to R AKA. Motor  Motor Motor: Within Functional Limits  Mobility Bed Mobility Bed Mobility: Rolling Right;Rolling Left;Supine to Sit;Sit to Supine Rolling Right: Independent with assistive device Rolling Left: Independent with assistive device Supine to Sit: Independent with assistive device Sit to Supine: Independent with assistive device Transfers Transfers: Sit to Stand;Stand to Sit;Stand Pivot Transfers Sit to Stand: Independent with assistive device Stand to Sit: Independent with assistive device Stand Pivot Transfers: Independent with assistive device Transfer (Assistive device): Rolling walker Locomotion  Gait Ambulation: Yes Gait Assistance: Independent with assistive device Gait Distance (Feet): 30 Feet Assistive device: Rolling walker Gait Gait: Yes Gait Pattern: Impaired (hop to) Gait velocity: decreased Stairs / Additional Locomotion Stairs: Yes Stairs Assistance: Contact Guard/Touching assist Stair Management Technique: Two rails Number of Stairs: 5 Height of Stairs: 6 Wheelchair Mobility Wheelchair Mobility: Yes Wheelchair Assistance: Independent with Scientist, research (life sciences): Both upper extremities Wheelchair Parts Management: Independent Distance: 150  Trunk/Postural Assessment  Cervical Assessment Cervical Assessment: Exceptions to [facility] (forward head) Thoracic Assessment Thoracic  Assessment: Exceptions to [facility] (rounded shoulders) Lumbar Assessment Lumbar Assessment: Exceptions to [facility] (posterior pelvic tilt) Postural Control Postural Control: Within Functional Limits  Balance Balance Balance Assessed: Yes Static Sitting Balance Static Sitting - Balance Support: Feet supported Static Sitting - Level of Assistance: 7: Independent Dynamic Sitting Balance Dynamic Sitting - Balance Support: During functional activity Dynamic Sitting - Level of Assistance: 6: Modified independent (Device/Increase time) Static Standing Balance Static Standing - Balance Support: Bilateral  upper extremity supported;During functional activity Static Standing - Level of Assistance: 6: Modified independent (Device/Increase time) Dynamic Standing Balance Dynamic Standing - Balance Support: During functional activity;Bilateral upper extremity supported Dynamic Standing - Level of Assistance: 6: Modified independent (Device/Increase time) Extremity Assessment  RLE Assessment RLE Assessment: Exceptions to [facility] General Strength Comments: grossly 3/5 LLE Assessment LLE Assessment: Within Functional Limits   [facility] Jenney Modest, PT, DPT  06/15/2023, 12:43 PM

## 2023-06-15 NOTE — Progress Notes (Signed)
  Progress Note    06/15/2023 8:59 AM * No surgery found *  Subjective:  pain R AKA stump   Vitals:   06/14/23 2113 06/15/23 0501  BP: (!) 147/53 (!) 157/52  Pulse:  70  Resp:  20  Temp:  98.6 F (37 C)  SpO2:  100%   Physical Exam: Lungs:  non labored Incisions:  R AKA incision necrotic medially and some drainage centrally; R groin with fibrinous exudate Neurologic: A&O       CBC    Component Value Date/Time   WBC 8.4 06/12/2023 0518   RBC 2.85 (L) 06/12/2023 0518   HGB 8.7 (L) 06/12/2023 0518   HGB 14.2 03/23/2021 0928   HCT 27.2 (L) 06/12/2023 0518   HCT 42.1 03/23/2021 0928   PLT 392 06/12/2023 0518   PLT 135 (L) 03/23/2021 0928   MCV 95.4 06/12/2023 0518   MCV 93 03/23/2021 0928   MCH 30.5 06/12/2023 0518   MCHC 32.0 06/12/2023 0518   RDW 15.9 (H) 06/12/2023 0518   RDW 12.0 03/23/2021 0928   LYMPHSABS 2.2 06/08/2023 0509   LYMPHSABS 1.9 03/23/2021 0928   MONOABS 1.0 06/08/2023 0509   EOSABS 0.1 06/08/2023 0509   EOSABS 0.3 03/23/2021 0928   BASOSABS 0.0 06/08/2023 0509   BASOSABS 0.0 03/23/2021 0928    BMET    Component Value Date/Time   NA 137 06/12/2023 0518   NA 138 01/15/2020 1128   K 4.0 06/12/2023 0518   CL 104 06/12/2023 0518   CO2 26 06/12/2023 0518   GLUCOSE 96 06/12/2023 0518   BUN 9 06/12/2023 0518   BUN 8 01/15/2020 1128   CREATININE 0.62 06/12/2023 0518   CALCIUM  9.2 06/12/2023 0518   GFRNONAA >60 06/12/2023 0518   GFRAA 80 01/15/2020 1128    INR    Component Value Date/Time   INR 1.5 (H) 05/29/2023 0030     Intake/Output Summary (Last 24 hours) at 06/15/2023 0859 Last data filed at 06/14/2023 1849 Gross per 24 hour  Intake 1954.05 ml  Output --  Net 1954.05 ml     Assessment/Plan:  68 y.o. female is s/p R leg bypass excision with subsequent AKA  R AKA incision appearing more necrotic medially; Also has some drainage centrally; she is at risk for failure requiring high revision Copious fibrinous exudate noted  in R groin without tissue closer over ax-fem stump which is immediately superior to the incision Patient had breakfast this morning; plan is to return to the OR tomorrow for debridement of R groin wound.  Patient is aware but Dr. Charlotte Cookey will discuss with her in further detail later today NPO past midnight Consent ordered    Cordie Deters, PA-C [facility] [phone] 06/15/2023 8:59 AM

## 2023-06-15 NOTE — Progress Notes (Signed)
   06/15/23 1057  Spiritual Encounters  Type of Visit Initial  Care provided to: Pt and family  Reason for visit Surgical  OnCall Visit No  Spiritual Framework  Community/Connection Family  Family Stress Factors Health changes   While walking through hallway, I was approached by the Pt's daughter, who informed me that her mother would be undergoing surgery again. I visited the Pt's room  to offer a brief presence and greeted both the Pt and her daughter.  I assured the daughter that [employer] are available , as she was gather information from the medical team regarding her mother's condition.

## 2023-06-16 ENCOUNTER — Encounter (HOSPITAL_COMMUNITY)
Admission: EM | Disposition: A | Discharge status: Rehabilitation Facility | Payer: Self-pay | Attending: Family Medicine

## 2023-06-16 ENCOUNTER — Inpatient Hospital Stay (HOSPITAL_COMMUNITY): Payer: Self-pay

## 2023-06-16 ENCOUNTER — Other Ambulatory Visit: Payer: Self-pay

## 2023-06-16 ENCOUNTER — Encounter (HOSPITAL_COMMUNITY): Payer: Self-pay | Admitting: Physical Medicine & Rehabilitation

## 2023-06-16 ENCOUNTER — Inpatient Hospital Stay (HOSPITAL_COMMUNITY)
Admission: EM | Admit: 2023-06-16 | Discharge: 2023-07-04 | DRG: 463 | Disposition: A | Discharge status: Rehabilitation Facility | Source: Other Acute Inpatient Hospital | Attending: Family Medicine | Admitting: Family Medicine

## 2023-06-16 ENCOUNTER — Encounter (HOSPITAL_COMMUNITY): Payer: Self-pay

## 2023-06-16 DIAGNOSIS — R0902 Hypoxemia: Secondary | ICD-10-CM | POA: Diagnosis not present

## 2023-06-16 DIAGNOSIS — S31109D Unspecified open wound of abdominal wall, unspecified quadrant without penetration into peritoneal cavity, subsequent encounter: Secondary | ICD-10-CM | POA: Diagnosis not present

## 2023-06-16 DIAGNOSIS — T8189XA Other complications of procedures, not elsewhere classified, initial encounter: Secondary | ICD-10-CM | POA: Diagnosis not present

## 2023-06-16 DIAGNOSIS — B950 Streptococcus, group A, as the cause of diseases classified elsewhere: Secondary | ICD-10-CM | POA: Diagnosis not present

## 2023-06-16 DIAGNOSIS — T8149XA Infection following a procedure, other surgical site, initial encounter: Secondary | ICD-10-CM

## 2023-06-16 DIAGNOSIS — I10 Essential (primary) hypertension: Secondary | ICD-10-CM | POA: Diagnosis not present

## 2023-06-16 DIAGNOSIS — T8789 Other complications of amputation stump: Principal | ICD-10-CM | POA: Diagnosis present

## 2023-06-16 DIAGNOSIS — T8131XA Disruption of external operation (surgical) wound, not elsewhere classified, initial encounter: Secondary | ICD-10-CM

## 2023-06-16 DIAGNOSIS — I96 Gangrene, not elsewhere classified: Secondary | ICD-10-CM | POA: Diagnosis not present

## 2023-06-16 DIAGNOSIS — S31103A Unspecified open wound of abdominal wall, right lower quadrant without penetration into peritoneal cavity, initial encounter: Secondary | ICD-10-CM | POA: Diagnosis not present

## 2023-06-16 DIAGNOSIS — T8781 Dehiscence of amputation stump: Secondary | ICD-10-CM

## 2023-06-16 DIAGNOSIS — E785 Hyperlipidemia, unspecified: Secondary | ICD-10-CM | POA: Diagnosis present

## 2023-06-16 DIAGNOSIS — I70261 Atherosclerosis of native arteries of extremities with gangrene, right leg: Secondary | ICD-10-CM | POA: Diagnosis not present

## 2023-06-16 DIAGNOSIS — Z515 Encounter for palliative care: Secondary | ICD-10-CM | POA: Diagnosis not present

## 2023-06-16 DIAGNOSIS — Z66 Do not resuscitate: Secondary | ICD-10-CM | POA: Diagnosis not present

## 2023-06-16 DIAGNOSIS — B3731 Acute candidiasis of vulva and vagina: Secondary | ICD-10-CM | POA: Diagnosis not present

## 2023-06-16 DIAGNOSIS — M79604 Pain in right leg: Secondary | ICD-10-CM | POA: Diagnosis not present

## 2023-06-16 DIAGNOSIS — I70221 Atherosclerosis of native arteries of extremities with rest pain, right leg: Secondary | ICD-10-CM | POA: Diagnosis not present

## 2023-06-16 DIAGNOSIS — Z87891 Personal history of nicotine dependence: Secondary | ICD-10-CM | POA: Diagnosis not present

## 2023-06-16 DIAGNOSIS — Z89621 Acquired absence of right hip joint: Secondary | ICD-10-CM | POA: Diagnosis not present

## 2023-06-16 DIAGNOSIS — B9562 Methicillin resistant Staphylococcus aureus infection as the cause of diseases classified elsewhere: Secondary | ICD-10-CM | POA: Diagnosis not present

## 2023-06-16 DIAGNOSIS — T8579XD Infection and inflammatory reaction due to other internal prosthetic devices, implants and grafts, subsequent encounter: Secondary | ICD-10-CM | POA: Diagnosis not present

## 2023-06-16 DIAGNOSIS — Y835 Amputation of limb(s) as the cause of abnormal reaction of the patient, or of later complication, without mention of misadventure at the time of the procedure: Secondary | ICD-10-CM | POA: Diagnosis present

## 2023-06-16 DIAGNOSIS — Y832 Surgical operation with anastomosis, bypass or graft as the cause of abnormal reaction of the patient, or of later complication, without mention of misadventure at the time of the procedure: Secondary | ICD-10-CM | POA: Diagnosis present

## 2023-06-16 DIAGNOSIS — Z8673 Personal history of transient ischemic attack (TIA), and cerebral infarction without residual deficits: Secondary | ICD-10-CM | POA: Diagnosis not present

## 2023-06-16 DIAGNOSIS — Z79899 Other long term (current) drug therapy: Secondary | ICD-10-CM | POA: Diagnosis not present

## 2023-06-16 DIAGNOSIS — I639 Cerebral infarction, unspecified: Secondary | ICD-10-CM

## 2023-06-16 DIAGNOSIS — R110 Nausea: Secondary | ICD-10-CM | POA: Diagnosis not present

## 2023-06-16 DIAGNOSIS — T8141XA Infection following a procedure, superficial incisional surgical site, initial encounter: Secondary | ICD-10-CM | POA: Diagnosis not present

## 2023-06-16 DIAGNOSIS — E871 Hypo-osmolality and hyponatremia: Secondary | ICD-10-CM | POA: Diagnosis not present

## 2023-06-16 DIAGNOSIS — Z89611 Acquired absence of right leg above knee: Secondary | ICD-10-CM | POA: Diagnosis not present

## 2023-06-16 DIAGNOSIS — N8189 Other female genital prolapse: Secondary | ICD-10-CM | POA: Diagnosis not present

## 2023-06-16 DIAGNOSIS — T8753 Necrosis of amputation stump, right lower extremity: Secondary | ICD-10-CM | POA: Diagnosis not present

## 2023-06-16 DIAGNOSIS — I700 Atherosclerosis of aorta: Secondary | ICD-10-CM | POA: Diagnosis not present

## 2023-06-16 DIAGNOSIS — I959 Hypotension, unspecified: Secondary | ICD-10-CM | POA: Diagnosis not present

## 2023-06-16 DIAGNOSIS — I2510 Atherosclerotic heart disease of native coronary artery without angina pectoris: Secondary | ICD-10-CM

## 2023-06-16 DIAGNOSIS — E43 Unspecified severe protein-calorie malnutrition: Secondary | ICD-10-CM | POA: Diagnosis present

## 2023-06-16 DIAGNOSIS — R7401 Elevation of levels of liver transaminase levels: Secondary | ICD-10-CM | POA: Diagnosis not present

## 2023-06-16 DIAGNOSIS — R140 Abdominal distension (gaseous): Secondary | ICD-10-CM | POA: Diagnosis not present

## 2023-06-16 DIAGNOSIS — Z7189 Other specified counseling: Secondary | ICD-10-CM | POA: Diagnosis not present

## 2023-06-16 DIAGNOSIS — Z9889 Other specified postprocedural states: Secondary | ICD-10-CM

## 2023-06-16 DIAGNOSIS — Z7983 Long term (current) use of bisphosphonates: Secondary | ICD-10-CM

## 2023-06-16 DIAGNOSIS — Z960 Presence of urogenital implants: Secondary | ICD-10-CM | POA: Diagnosis present

## 2023-06-16 DIAGNOSIS — Z7982 Long term (current) use of aspirin: Secondary | ICD-10-CM

## 2023-06-16 DIAGNOSIS — T827XXA Infection and inflammatory reaction due to other cardiac and vascular devices, implants and grafts, initial encounter: Secondary | ICD-10-CM | POA: Diagnosis present

## 2023-06-16 DIAGNOSIS — K5900 Constipation, unspecified: Secondary | ICD-10-CM | POA: Diagnosis not present

## 2023-06-16 DIAGNOSIS — N819 Female genital prolapse, unspecified: Secondary | ICD-10-CM | POA: Diagnosis present

## 2023-06-16 DIAGNOSIS — N898 Other specified noninflammatory disorders of vagina: Secondary | ICD-10-CM | POA: Diagnosis not present

## 2023-06-16 DIAGNOSIS — A419 Sepsis, unspecified organism: Secondary | ICD-10-CM | POA: Diagnosis not present

## 2023-06-16 DIAGNOSIS — S88112A Complete traumatic amputation at level between knee and ankle, left lower leg, initial encounter: Secondary | ICD-10-CM | POA: Diagnosis not present

## 2023-06-16 DIAGNOSIS — D62 Acute posthemorrhagic anemia: Secondary | ICD-10-CM | POA: Diagnosis not present

## 2023-06-16 DIAGNOSIS — D72829 Elevated white blood cell count, unspecified: Secondary | ICD-10-CM | POA: Diagnosis not present

## 2023-06-16 DIAGNOSIS — Z4781 Encounter for orthopedic aftercare following surgical amputation: Secondary | ICD-10-CM | POA: Diagnosis not present

## 2023-06-16 DIAGNOSIS — Z6821 Body mass index (BMI) 21.0-21.9, adult: Secondary | ICD-10-CM

## 2023-06-16 DIAGNOSIS — Z452 Encounter for adjustment and management of vascular access device: Secondary | ICD-10-CM | POA: Diagnosis not present

## 2023-06-16 DIAGNOSIS — T8149XD Infection following a procedure, other surgical site, subsequent encounter: Secondary | ICD-10-CM | POA: Diagnosis not present

## 2023-06-16 DIAGNOSIS — R7881 Bacteremia: Secondary | ICD-10-CM | POA: Diagnosis not present

## 2023-06-16 DIAGNOSIS — R339 Retention of urine, unspecified: Secondary | ICD-10-CM | POA: Diagnosis not present

## 2023-06-16 DIAGNOSIS — R109 Unspecified abdominal pain: Secondary | ICD-10-CM | POA: Diagnosis not present

## 2023-06-16 DIAGNOSIS — I739 Peripheral vascular disease, unspecified: Secondary | ICD-10-CM | POA: Diagnosis not present

## 2023-06-16 HISTORY — PX: REVISION AMPUTATION, BELOW THE KNEE: SHX7423

## 2023-06-16 HISTORY — PX: APPLICATION, SKIN SUBSTITUTE: SHX7530

## 2023-06-16 HISTORY — PX: APPLICATION OF WOUND VAC: SHX5189

## 2023-06-16 HISTORY — DX: Infection following a procedure, other surgical site, initial encounter: T81.49XA

## 2023-06-16 HISTORY — PX: INCISION AND DRAINAGE OF WOUND: SHX1803

## 2023-06-16 LAB — HEPATIC FUNCTION PANEL
ALT: 30 U/L (ref 0–44)
AST: 34 U/L (ref 15–41)
Albumin: 2.5 g/dL — ABNORMAL LOW (ref 3.5–5.0)
Alkaline Phosphatase: 70 U/L (ref 38–126)
Bilirubin, Direct: 0.1 mg/dL (ref 0.0–0.2)
Indirect Bilirubin: 0.7 mg/dL (ref 0.3–0.9)
Total Bilirubin: 0.8 mg/dL (ref 0.0–1.2)
Total Protein: 7.7 g/dL (ref 6.5–8.1)

## 2023-06-16 LAB — MAGNESIUM: Magnesium: 2 mg/dL (ref 1.7–2.4)

## 2023-06-16 SURGERY — IRRIGATION AND DEBRIDEMENT WOUND
Anesthesia: General | Site: Leg Upper | Laterality: Right

## 2023-06-16 MED ORDER — HYDROMORPHONE HCL 1 MG/ML IJ SOLN
1.0000 mg | INTRAMUSCULAR | Status: DC | PRN
  Administered 2023-06-16 – 2023-06-17 (×5): 1 mg via INTRAVENOUS
  Filled 2023-06-16 (×6): qty 1

## 2023-06-16 MED ORDER — SODIUM CHLORIDE 0.9 % IR SOLN
Status: DC | PRN
  Administered 2023-06-16: 3000 mL

## 2023-06-16 MED ORDER — PROPOFOL 10 MG/ML IV BOLUS
INTRAVENOUS | Status: DC | PRN
  Administered 2023-06-16: 110 mg via INTRAVENOUS

## 2023-06-16 MED ORDER — LIDOCAINE 2% (20 MG/ML) 5 ML SYRINGE
INTRAMUSCULAR | Status: DC | PRN
  Administered 2023-06-16: 60 mg via INTRAVENOUS

## 2023-06-16 MED ORDER — 0.9 % SODIUM CHLORIDE (POUR BTL) OPTIME
TOPICAL | Status: DC | PRN
  Administered 2023-06-16: 1000 mL

## 2023-06-16 MED ORDER — HYDROMORPHONE HCL 1 MG/ML IJ SOLN
0.2500 mg | INTRAMUSCULAR | Status: DC | PRN
  Administered 2023-06-16 (×2): 0.5 mg via INTRAVENOUS

## 2023-06-16 MED ORDER — ACETAMINOPHEN 500 MG PO TABS: 1000.0000 mg | ORAL_TABLET | Freq: Once | ORAL | Status: AC

## 2023-06-16 MED ORDER — CHLORHEXIDINE GLUCONATE 0.12 % MT SOLN
15.0000 mL | Freq: Once | OROMUCOSAL | Status: AC
  Administered 2023-06-16: 15 mL via OROMUCOSAL

## 2023-06-16 MED ORDER — HYDROMORPHONE HCL 1 MG/ML IJ SOLN
1.0000 mg | Freq: Four times a day (QID) | INTRAMUSCULAR | Status: DC | PRN
  Administered 2023-06-16: 1 mg via INTRAVENOUS
  Filled 2023-06-16: qty 1

## 2023-06-16 MED ORDER — ORAL CARE MOUTH RINSE: 15.0000 mL | Freq: Once | OROMUCOSAL | Status: AC

## 2023-06-16 MED ORDER — OXYCODONE HCL 5 MG PO TABS: 5.0000 mg | ORAL_TABLET | Freq: Once | ORAL | Status: DC | PRN

## 2023-06-16 MED ORDER — LACTATED RINGERS IV SOLN: INTRAVENOUS | Status: DC

## 2023-06-16 MED ORDER — PHENYLEPHRINE HCL-NACL 20-0.9 MG/250ML-% IV SOLN
INTRAVENOUS | Status: DC | PRN
  Administered 2023-06-16: 30 ug/min via INTRAVENOUS

## 2023-06-16 MED ORDER — PHENYLEPHRINE 80 MCG/ML (10ML) SYRINGE FOR IV PUSH (FOR BLOOD PRESSURE SUPPORT)
PREFILLED_SYRINGE | INTRAVENOUS | Status: DC | PRN
  Administered 2023-06-16: 80 ug via INTRAVENOUS

## 2023-06-16 MED ORDER — CHLORHEXIDINE GLUCONATE CLOTH 2 % EX PADS
6.0000 | MEDICATED_PAD | Freq: Every day | CUTANEOUS | Status: DC
  Administered 2023-06-16 – 2023-07-04 (×17): 6 via TOPICAL

## 2023-06-16 MED ORDER — PROPOFOL 10 MG/ML IV BOLUS
INTRAVENOUS | Status: AC
  Filled 2023-06-16: qty 20

## 2023-06-16 MED ORDER — OXYCODONE HCL 5 MG/5ML PO SOLN: 5.0000 mg | Freq: Once | ORAL | Status: DC | PRN

## 2023-06-16 MED ORDER — HEPARIN SODIUM (PORCINE) 5000 UNIT/ML IJ SOLN
5000.0000 [IU] | Freq: Two times a day (BID) | INTRAMUSCULAR | Status: DC
  Administered 2023-06-16 – 2023-06-20 (×9): 5000 [IU] via SUBCUTANEOUS
  Filled 2023-06-16 (×9): qty 1

## 2023-06-16 MED ORDER — ONDANSETRON HCL 4 MG/2ML IJ SOLN: 4.0000 mg | Freq: Once | INTRAMUSCULAR | Status: DC | PRN

## 2023-06-16 MED ORDER — VANCOMYCIN HCL 1.25 G IV SOLR
1250.0000 mg | INTRAVENOUS | Status: DC
  Administered 2023-06-17: 1250 mg via INTRAVENOUS
  Filled 2023-06-16 (×2): qty 25

## 2023-06-16 MED ORDER — MIDAZOLAM HCL 2 MG/2ML IJ SOLN
INTRAMUSCULAR | Status: AC
  Filled 2023-06-16: qty 2

## 2023-06-16 MED ORDER — LIDOCAINE 2% (20 MG/ML) 5 ML SYRINGE
INTRAMUSCULAR | Status: AC
  Filled 2023-06-16: qty 5

## 2023-06-16 MED ORDER — FENTANYL CITRATE (PF) 250 MCG/5ML IJ SOLN
INTRAMUSCULAR | Status: DC | PRN
  Administered 2023-06-16 (×2): 50 ug via INTRAVENOUS

## 2023-06-16 MED ORDER — OXYCODONE HCL 5 MG PO TABS
5.0000 mg | ORAL_TABLET | ORAL | Status: DC | PRN
  Administered 2023-06-16 – 2023-06-18 (×6): 10 mg via ORAL
  Filled 2023-06-16 (×6): qty 2

## 2023-06-16 MED ORDER — ONDANSETRON HCL 4 MG/2ML IJ SOLN
INTRAMUSCULAR | Status: DC | PRN
  Administered 2023-06-16: 4 mg via INTRAVENOUS

## 2023-06-16 MED ORDER — FENTANYL CITRATE (PF) 100 MCG/2ML IJ SOLN
INTRAMUSCULAR | Status: AC
Start: 2023-06-16 — End: 2023-06-16
  Filled 2023-06-16: qty 2

## 2023-06-16 MED ORDER — HYDROMORPHONE HCL 1 MG/ML IJ SOLN
INTRAMUSCULAR | Status: AC
  Filled 2023-06-16: qty 1

## 2023-06-16 MED ORDER — FENTANYL CITRATE (PF) 100 MCG/2ML IJ SOLN
INTRAMUSCULAR | Status: AC
  Filled 2023-06-16: qty 2

## 2023-06-16 MED ORDER — SODIUM CHLORIDE 0.9% FLUSH
3.0000 mL | Freq: Two times a day (BID) | INTRAVENOUS | Status: DC
  Administered 2023-06-16 – 2023-06-30 (×22): 3 mL via INTRAVENOUS
  Administered 2023-06-30: 10 mL via INTRAVENOUS
  Administered 2023-07-01 – 2023-07-03 (×6): 3 mL via INTRAVENOUS

## 2023-06-16 MED ORDER — FENTANYL CITRATE (PF) 250 MCG/5ML IJ SOLN
INTRAMUSCULAR | Status: AC
  Filled 2023-06-16: qty 5

## 2023-06-16 MED ORDER — DEXMEDETOMIDINE HCL IN NACL 80 MCG/20ML IV SOLN
INTRAVENOUS | Status: DC | PRN
  Administered 2023-06-16: 4 ug via INTRAVENOUS

## 2023-06-16 MED ORDER — OXYCODONE HCL ER 10 MG PO T12A
10.0000 mg | EXTENDED_RELEASE_TABLET | Freq: Every day | ORAL | Status: DC
  Administered 2023-06-17 – 2023-07-03 (×17): 10 mg via ORAL
  Filled 2023-06-16 (×18): qty 1

## 2023-06-16 MED ORDER — ACETAMINOPHEN 500 MG PO TABS
ORAL_TABLET | ORAL | Status: AC
  Administered 2023-06-16: 1000 mg via ORAL
  Filled 2023-06-16: qty 2

## 2023-06-16 MED ORDER — FENTANYL CITRATE (PF) 100 MCG/2ML IJ SOLN
25.0000 ug | INTRAMUSCULAR | Status: DC | PRN
  Administered 2023-06-16 (×3): 50 ug via INTRAVENOUS

## 2023-06-16 SURGICAL SUPPLY — 43 items
BAG COUNTER SPONGE SURGICOUNT (BAG) ×2 IMPLANT
BENZOIN TINCTURE PRP APPL 2/3 (GAUZE/BANDAGES/DRESSINGS) IMPLANT
BLADE SAGITTAL 25.0X1.19X90 (BLADE) IMPLANT
BNDG ELASTIC 4X5.8 VLCR STR LF (GAUZE/BANDAGES/DRESSINGS) IMPLANT
BNDG ELASTIC 6INX 5YD STR LF (GAUZE/BANDAGES/DRESSINGS) IMPLANT
BNDG ELASTIC 6X15 VLCR STRL LF (GAUZE/BANDAGES/DRESSINGS) IMPLANT
BNDG GAUZE DERMACEA FLUFF 4 (GAUZE/BANDAGES/DRESSINGS) IMPLANT
CANISTER SUCTION 3000ML PPV (SUCTIONS) ×2 IMPLANT
CANISTER WOUNDNEG PRESSURE 500 (CANNISTER) IMPLANT
CLIP TI MEDIUM 6 (CLIP) ×2 IMPLANT
CLIP TI WIDE RED SMALL 6 (CLIP) ×2 IMPLANT
COVER SURGICAL LIGHT HANDLE (MISCELLANEOUS) ×2 IMPLANT
DRAPE HALF SHEET 40X57 (DRAPES) IMPLANT
DRAPE U-SHAPE 76X120 STRL (DRAPES) IMPLANT
DRESSING VERAFLO CLEANSE CC (GAUZE/BANDAGES/DRESSINGS) IMPLANT
DRSG ADAPTIC 3X8 NADH LF (GAUZE/BANDAGES/DRESSINGS) IMPLANT
ELECTRODE REM PT RTRN 9FT ADLT (ELECTROSURGICAL) ×2 IMPLANT
GAUZE SPONGE 4X4 12PLY STRL (GAUZE/BANDAGES/DRESSINGS) ×2 IMPLANT
GAUZE SPONGE 4X4 12PLY STRL LF (GAUZE/BANDAGES/DRESSINGS) IMPLANT
GAUZE XEROFORM 5X9 LF (GAUZE/BANDAGES/DRESSINGS) IMPLANT
GLOVE SURG SS PI 7.5 STRL IVOR (GLOVE) ×6 IMPLANT
GOWN STRL REUS W/ TWL LRG LVL3 (GOWN DISPOSABLE) ×4 IMPLANT
GOWN STRL REUS W/ TWL XL LVL3 (GOWN DISPOSABLE) ×2 IMPLANT
GRAFT SKIN WND MICRO 38 (Tissue) IMPLANT
GRAFT SKIN WND OMEGA3 3X7 (Tissue) IMPLANT
IV NS IRRIG 3000ML ARTHROMATIC (IV SOLUTION) ×2 IMPLANT
KIT BASIN OR (CUSTOM PROCEDURE TRAY) ×2 IMPLANT
KIT TURNOVER KIT B (KITS) ×2 IMPLANT
NS IRRIG 1000ML POUR BTL (IV SOLUTION) ×2 IMPLANT
PACK CV ACCESS (CUSTOM PROCEDURE TRAY) IMPLANT
PACK GENERAL/GYN (CUSTOM PROCEDURE TRAY) ×2 IMPLANT
PACK UNIVERSAL I (CUSTOM PROCEDURE TRAY) ×2 IMPLANT
PAD ARMBOARD POSITIONER FOAM (MISCELLANEOUS) ×4 IMPLANT
PAD NEG PRESSURE SENSATRAC (MISCELLANEOUS) IMPLANT
SET HNDPC FAN SPRY TIP SCT (DISPOSABLE) IMPLANT
SUT ETHILON 3 0 PS 1 (SUTURE) IMPLANT
SUT VIC AB 2-0 CT1 18 (SUTURE) IMPLANT
SUT VIC AB 2-0 CTX 36 (SUTURE) IMPLANT
SUT VIC AB 3-0 SH 27X BRD (SUTURE) IMPLANT
SUT VIC AB 4-0 PS2 18 (SUTURE) IMPLANT
TIP FAN IRRIG PULSAVAC PLUS (DISPOSABLE) IMPLANT
TOWEL GREEN STERILE (TOWEL DISPOSABLE) ×2 IMPLANT
WATER STERILE IRR 1000ML POUR (IV SOLUTION) ×2 IMPLANT

## 2023-06-16 NOTE — Transfer of Care (Signed)
 Immediate Anesthesia Transfer of Care Note  Patient: Katherine Archer  Procedure(s) Performed: IRRIGATION AND DEBRIDEMENT WOUND (Right: Groin) REVISION AMPUTATION, ABOVE THE KNEE (Right: Leg Upper) APPLICATION, SKIN SUBSTITUTE (Right: Leg Upper) APPLICATION, WOUND VAC (Right: Groin)  Patient Location: PACU  Anesthesia Type:General  Level of Consciousness: awake  Airway & Oxygen Therapy: Patient Spontanous Breathing  Post-op Assessment: Report given to RN and Post -op Vital signs reviewed and stable  Post vital signs: Reviewed and stable  Last Vitals:  Vitals Value Taken Time  BP 137/47 06/16/23 15:15  Temp    Pulse 67 06/16/23 15:17  Resp 18 06/16/23 15:17  SpO2 94 % 06/16/23 15:17  Vitals shown include unfiled device data.  Last Pain:  Vitals:   06/16/23 1109  TempSrc:   PainSc: 0-No pain         Complications: No notable events documented.

## 2023-06-16 NOTE — Anesthesia Procedure Notes (Signed)
 Procedure Name: LMA Insertion Date/Time: 06/16/2023 1:39 PM  Performed by: Masson Nalepa A, CRNAPre-anesthesia Checklist: Patient identified, Emergency Drugs available, Suction available and Patient being monitored Patient Re-evaluated:Patient Re-evaluated prior to induction Oxygen Delivery Method: Circle System Utilized Preoxygenation: Pre-oxygenation with 100% oxygen Induction Type: IV induction Ventilation: Mask ventilation without difficulty LMA: LMA inserted LMA Size: 4.0 Number of attempts: 1 Airway Equipment and Method: Bite block Placement Confirmation: positive ETCO2 Tube secured with: Tape Dental Injury: Teeth and Oropharynx as per pre-operative assessment

## 2023-06-16 NOTE — Progress Notes (Signed)
 Patient ID: Katherine Archer, female   DOB: 12/16/1955, 68 y.o.   MRN: [id number] Have let granddaughter-Katherine Archer and pt along with Robin-bedside RN the wound vac is in swk office making sure it does not let lost in case pt goes to acute. Informed granddaughter to take the equipment in the room home when here today so this does not get lose either

## 2023-06-16 NOTE — Anesthesia Preprocedure Evaluation (Addendum)
 Anesthesia Evaluation  Patient identified by MRN, date of birth, ID band Patient awake    Reviewed: Allergy & Precautions, NPO status , Patient's Chart, lab work & pertinent test results  Airway Mallampati: II  TM Distance: >3 FB Neck ROM: Full    Dental  (+) Edentulous Lower, Edentulous Upper   Pulmonary former smoker   Pulmonary exam normal        Cardiovascular hypertension, Pt. on medications + CAD and + Peripheral Vascular Disease  Normal cardiovascular exam   '21 TTE - EF 60 to 65%. There is mild concentric left ventricular hypertrophy. Grade II diastolic dysfunction (pseudonormalization). There is mildly elevated pulmonary artery systolic pressure. The estimated right ventricular systolic pressure is 36.3 mmHg.      Neuro/Psych  Neuromuscular disease CVA, Residual Symptoms  negative psych ROS   GI/Hepatic negative GI ROS, Neg liver ROS,,,  Endo/Other  negative endocrine ROS    Renal/GU negative Renal ROS     Musculoskeletal  Ambulates with cane   Abdominal   Peds  Hematology  (+) Blood dyscrasia, anemia  On eliquis  INR 1.5    Anesthesia Other Findings   Reproductive/Obstetrics                             Anesthesia Physical Anesthesia Plan  ASA: 4  Anesthesia Plan: General   Post-op Pain Management: Tylenol  PO (pre-op)*   Induction: Intravenous  PONV Risk Score and Plan: 3 and Ondansetron , Dexamethasone  and Treatment may vary due to age or medical condition  Airway Management Planned: LMA  Additional Equipment: None  Intra-op Plan:   Post-operative Plan: Extubation in OR  Informed Consent: I have reviewed the patients History and Physical, chart, labs and discussed the procedure including the risks, benefits and alternatives for the proposed anesthesia with the patient or authorized representative who has indicated his/her understanding and acceptance.   Patient  has DNR.  Discussed DNR with patient and Suspend DNR.   Dental advisory given and Interpreter used for interview  Plan Discussed with: CRNA and Anesthesiologist  Anesthesia Plan Comments:        Anesthesia Quick Evaluation

## 2023-06-16 NOTE — Progress Notes (Signed)
 50mcg Fentanyl  wasted in stericycle with Ian Maine RN  Judy Null, RN

## 2023-06-16 NOTE — H&P (View-Only) (Signed)
  Progress Note    06/16/2023 8:01 AM * No surgery found *  Subjective:  pain R AKA.  Having chills   Vitals:   06/15/23 2040 06/16/23 0609  BP: (!) 135/51 (!) 133/46  Pulse: 72 69  Resp: 18 18  Temp: 98.6 F (37 C) 98.6 F (37 C)  SpO2: 99% 100%   Physical Exam: Lungs:  non labored Incisions:  R AKA with pain, central drainage; medial necrosis Extremities:  dressing left in place R groin Neurologic: A&O  CBC    Component Value Date/Time   WBC 7.6 06/15/2023 1339   RBC 2.85 (L) 06/15/2023 1339   HGB 8.7 (L) 06/15/2023 1339   HGB 14.2 03/23/2021 0928   HCT 27.5 (L) 06/15/2023 1339   HCT 42.1 03/23/2021 0928   PLT 367 06/15/2023 1339   PLT 135 (L) 03/23/2021 0928   MCV 96.5 06/15/2023 1339   MCV 93 03/23/2021 0928   MCH 30.5 06/15/2023 1339   MCHC 31.6 06/15/2023 1339   RDW 16.1 (H) 06/15/2023 1339   RDW 12.0 03/23/2021 0928   LYMPHSABS 2.2 06/08/2023 0509   LYMPHSABS 1.9 03/23/2021 0928   MONOABS 1.0 06/08/2023 0509   EOSABS 0.1 06/08/2023 0509   EOSABS 0.3 03/23/2021 0928   BASOSABS 0.0 06/08/2023 0509   BASOSABS 0.0 03/23/2021 0928    BMET    Component Value Date/Time   NA 134 (L) 06/15/2023 1339   NA 138 01/15/2020 1128   K 4.1 06/15/2023 1339   CL 97 (L) 06/15/2023 1339   CO2 26 06/15/2023 1339   GLUCOSE 119 (H) 06/15/2023 1339   BUN 14 06/15/2023 1339   BUN 8 01/15/2020 1128   CREATININE 0.84 06/15/2023 1339   CALCIUM  8.8 (L) 06/15/2023 1339   GFRNONAA >60 06/15/2023 1339   GFRAA 80 01/15/2020 1128    INR    Component Value Date/Time   INR 1.5 (H) 05/29/2023 0030     Intake/Output Summary (Last 24 hours) at 06/16/2023 0801 Last data filed at 06/15/2023 1300 Gross per 24 hour  Intake 236 ml  Output --  Net 236 ml     Assessment/Plan:  68 y.o. female is s/p R leg bypass excision with subsequent AKA   R AKA incision with central drainage and medial necrosis; high risk for revision; will discuss timing with Dr. Charlotte Cookey R groin  dressing left in place Plan is for return to OR today for debridement of R groin; will discuss R AKA revision with Dr. Charlotte Cookey; the above was discussed with the daughter who was present today and she is agreeable to proceed   Cordie Deters, PA-C [facility] [phone] 06/16/2023 8:01 AM  Spoke with patient and daughter about right AKA revision.  Will also wash out right groin.  May need graft excision and left ax-fem.  D/W family  Gareld June

## 2023-06-16 NOTE — H&P (Signed)
 History and Physical    Patient: Katherine Archer ZOX:[id number] DOB: Feb 16, 1955 DOA: 06/07/2023 DOS: the patient was seen and examined on 06/16/2023 PCP: Barbar Levine, MD  Patient coming from: Home Chief complaint:   HPI:  Katherine Archer is a 68 y.o. female with past medical history  of  old right-handed non-English-speaking female with history of hypertension, CVA, hyperlipidemia, quit smoking 3 years ago, PAD maintained on Eliquis  with multiple revascularizations including s/p right femoral-popliteal BGD 05/12/2023 per Dr. Charlotte Cookey and discharged home 05/15/2023.  Patient presented with sepsis and wound dehiscence and underwent reevaluation of her graft by vascular.  Patient had a wound VAC to the right groin.  Her hospital course was complicated by acute blood loss anemia.  Patient was seen by ID on 4 June with plans for discontinuation of daptomycin  due to elevated CPKs and vancomycin  for the remainder treatment until July 12, 2023. Pt came in today for surgical wounds breaking down. She is on IV antibiotics per ID. 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 Chart review shows she was admitted to inpatient rehab for PT, OT ST,and found to have right groin surgical site infection and planned s/p irrigation and wound debridement with revision amputation and wound vac placed today.   Hospitalist team asked to admit for further medical care with consultants.   ED Course: Pt in ed at bedside  is alert awake oriented irritable daughters at bedside are translating.  While unpacking mom's dose in the room. Vital signs in the ED were notable for the following:  Vitals:   06/16/23 0609 06/16/23 1056 06/16/23 1515 06/16/23 1530  BP: (!) 133/46 (!) 146/47 (!) 137/47 (!) 114/41  Pulse: 69 76 68 62  Temp: 98.6 F (37 C) 98.4 F (36.9 C) 98.1 F (36.7 C)   Resp: 18 18 18 17   Height:  5' 6 (1.676 m)    Weight: 68.2 kg 66.2 kg    SpO2: 100% 100% 95% 94%  TempSrc:  Oral Oral    BMI (Calculated): 24.28 23.57    BMP today showed sodium 134 chloride 97 glucose 119 LFTs added on and pending. CBC shows normal white count of 7.6 hemoglobin of 8.7 platelets of 367     >>While in the ED patient received the following: Medications  acetaminophen  (TYLENOL ) tablet 650 mg ( Oral MAR Hold 06/16/23 1055)  amLODipine  (NORVASC ) tablet 10 mg ( Oral Automatically Held 06/24/23 0800)  aspirin  EC tablet 81 mg ( Oral Automatically Held 06/24/23 0800)  ezetimibe  (ZETIA ) tablet 10 mg ( Oral Automatically Held 06/24/23 0800)  feeding supplement (ENSURE PLUS HIGH PROTEIN) liquid 237 mL ( Oral Automatically Held 06/24/23 1400)  gabapentin  (NEURONTIN ) capsule 300 mg ( Oral Automatically Held 07/01/23 2000)  heparin  injection 5,000 Units ( Subcutaneous Automatically Held 07/01/23 2200)  hydrochlorothiazide  (HYDRODIURIL ) tablet 12.5 mg ( Oral Automatically Held 06/24/23 0800)  irbesartan  (AVAPRO ) tablet 75 mg ( Oral Automatically Held 06/24/23 0800)  polyethylene glycol (MIRALAX  / GLYCOLAX ) packet 17 g ( Oral MAR Hold 06/16/23 1055)  sodium chloride  flush (NS) 0.9 % injection 10-40 mL ( Intracatheter Automatically Held 07/01/23 2000)  sodium chloride  flush (NS) 0.9 % injection 10-40 mL ( Intracatheter MAR Hold 06/16/23 1055)  Chlorhexidine  Gluconate Cloth 2 % PADS 6 each ( Topical Automatically Held 06/24/23 1700)  polyethylene glycol (MIRALAX  / GLYCOLAX ) packet 17 g ( Oral Automatically Held 06/24/23 0800)  ferrous sulfate  tablet 325 mg ( Oral Automatically Held 06/24/23  0800)  ascorbic acid  (VITAMIN C ) tablet 250 mg ( Oral Automatically Held 06/24/23 2000)  zinc  sulfate (50mg  elemental zinc ) capsule 220 mg ( Oral Automatically Held 06/21/23 0800)  oxyCODONE  (OXYCONTIN ) 12 hr tablet 10 mg ( Oral Automatically Held 06/26/23 2200)  vancomycin  (VANCOREADY) IVPB 1250 mg/250 mL ( Intravenous Automatically Held 06/28/23 1000)  oxyCODONE  (Oxy IR/ROXICODONE ) immediate release tablet 5-10 mg ( Oral MAR  Hold 06/16/23 1055)  sorbitol  70 % solution 30 mL ( Oral MAR Hold 06/16/23 1055)  hydrALAZINE  (APRESOLINE ) tablet 37.5 mg ( Oral Automatically Held 07/01/23 2200)  senna (SENOKOT) tablet 17.2 mg ( Oral Automatically Held 07/01/23 2200)  oxyCODONE  (Oxy IR/ROXICODONE ) immediate release tablet 5 mg (has no administration in time range)    Or  oxyCODONE  (ROXICODONE ) 5 MG/5ML solution 5 mg (has no administration in time range)  fentaNYL  (SUBLIMAZE ) injection 25-50 mcg (50 mcg Intravenous Given 06/16/23 1529)  ondansetron  (ZOFRAN ) injection 4 mg (has no administration in time range)  lactated ringers  infusion ( Intravenous Stopped 06/16/23 1508)  acetaminophen  (TYLENOL ) tablet 1,000 mg (1,000 mg Oral Given 06/16/23 1117)  chlorhexidine  (PERIDEX ) 0.12 % solution 15 mL (15 mLs Mouth/Throat Given 06/16/23 1117)    Or  Oral care mouth rinse ( Mouth Rinse See Alternative 06/16/23 1117)   ROS Past Medical History:  Diagnosis Date   Cervical polyp 09/18/2018   Critical lower limb ischemia (HCC)    Encounter for screening mammogram for breast cancer 05/05/2016   Fibrocystic breast changes of both breasts 04/29/2015   Hyperlipidemia    Hypertension    PAD (peripheral artery disease) (HCC) 08/23/2019   Stroke (HCC) 08/23/2019   Past Surgical History:  Procedure Laterality Date   ABDOMINAL AORTOGRAM W/LOWER EXTREMITY Bilateral 08/23/2019   Procedure: ABDOMINAL AORTOGRAM W/ Bilateral LOWER EXTREMITY [place];  Surgeon: Richrd Char, MD;  Location: [facility];  Service: Cardiovascular;  Laterality: Bilateral;   AMPUTATION Right 05/31/2023   Procedure: AMPUTATION, ABOVE KNEE SARTORIOUS MUSCLE FLAP;  Surgeon: Margherita Shell, MD;  Location: [facility] OR;  Service: Vascular;  Laterality: Right;   APPLICATION OF WOUND VAC Right 05/31/2023   Procedure: APPLICATION, WOUND VAC;  Surgeon: Margherita Shell, MD;  Location: [facility] OR;  Service: Vascular;  Laterality: Right;   APPLICATION, SKIN SUBSTITUTE Right 05/12/2023    Procedure: APPLICATION, SKIN SUBSTITUTE KERECIS 38SQ CM;  Surgeon: Margherita Shell, MD;  Location: [facility] OR;  Service: Vascular;  Laterality: Right;   AVGG REMOVAL Right 05/29/2023   Procedure: REMOVAL BYPASS GRAFT RIGHT LEG;  Surgeon: Kayla Part, MD;  Location: [facility] OR;  Service: Vascular;  Laterality: Right;   AXILLARY-FEMORAL BYPASS GRAFT Right 11/11/2019   Procedure: BYPASS GRAFT RIGHT AXILLA-BIFEMORAL;  Surgeon: Richrd Char, MD;  Location: [facility] OR;  Service: Vascular;  Laterality: Right;   BRAIN SURGERY     BREAST SURGERY Bilateral 2014   1980 R breast surgery   CEREBRAL ANEURYSM REPAIR     ENDARTERECTOMY FEMORAL Bilateral 11/11/2019   Procedure: ENDARTERECTOMY FEMORAL BILATERALLY;  Surgeon: Richrd Char, MD;  Location: [facility] OR;  Service: Vascular;  Laterality: Bilateral;   ENDARTERECTOMY FEMORAL Right 01/26/2022   Procedure: REDO RIGHT COMMON FEMORAL ENDARTERECTOMY USING 0.8CM X 7.6CM HEMASHIELD PLATINUM FINESSE PATCH;  Surgeon: Margherita Shell, MD;  Location: [facility] OR;  Service: Vascular;  Laterality: Right;   FEMORAL-POPLITEAL BYPASS GRAFT Right 01/26/2022   Procedure: RIGHT FEMORAL-POPLITEAL BYPASS GRAFT USING GORE X 80CM PROPATEN GRAFT;  Surgeon: Margherita Shell, MD;  Location: [facility] OR;  Service: Vascular;  Laterality: Right;   FEMORAL-POPLITEAL BYPASS GRAFT Right 05/12/2023   Procedure: BYPASS GRAFT FEMORAL-POPLITEAL ARTERY;  Surgeon: Margherita Shell, MD;  Location: [facility] OR;  Service: Vascular;  Laterality: Right;   INCISION AND DRAINAGE OF WOUND Right 05/31/2023   Procedure: IRRIGATION AND DEBRIDEMENT WOUND;  Surgeon: Margherita Shell, MD;  Location: [facility] OR;  Service: Vascular;  Laterality: Right;  RIGHT GROIN WASHOUT   IR RADIOLOGIST EVAL & MGMT  04/02/2020   IR RADIOLOGIST EVAL & MGMT  03/29/2021   THROMBECTOMY FEMORAL ARTERY Right 01/26/2022   Procedure: THROMBECTOMY RIGHT FEMORAL ARTERY;  Surgeon: Margherita Shell, MD;  Location: [facility] OR;  Service: Vascular;  Laterality: Right;    THROMBECTOMY OF BYPASS GRAFT FEMORAL- TIBIAL ARTERY Right 05/12/2023   Procedure: THROMBECTOMY, BYPASS GRAFT, ARTERIAL;  Surgeon: Margherita Shell, MD;  Location: [facility] OR;  Service: Vascular;  Laterality: Right;   TUBAL LIGATION  1990    reports that she quit smoking about 3 years ago. Her smoking use included cigarettes. She has never been exposed to tobacco smoke. She has never used smokeless tobacco. She reports that she does not drink alcohol  and does not use drugs. No Active Allergies Family History  Problem Relation Age of Onset   Heart Problems Father    Heart Problems Brother    Heart Problems Brother    Prior to Admission medications   Medication Sig Start Date End Date Taking? Authorizing Provider  vancomycin  IVPB Inject 1,250 mg into the vein daily. Indication:  BSI First Dose: Yes Last Day of Therapy:  07/12/2023 Labs - Sunday/Monday:  CBC/D, BMP, and vancomycin  trough. Labs - Thursday:  BMP and vancomycin  trough Labs - Once weekly: ESR and CRP Method of administration:Elastomeric Method of administration may be changed at the discretion of the patient and/or caregiver's ability to self-administer the medication ordered. 06/14/23 07/14/23 Yes Angiulli, Everlyn Hockey, PA-C  acetaminophen  (TYLENOL ) 325 MG tablet Take 2 tablets (650 mg total) by mouth every 6 (six) hours as needed for mild pain (pain score 1-3) or moderate pain (pain score 4-6). 06/14/23   Angiulli, Everlyn Hockey, PA-C  alendronate (FOSAMAX) 70 MG tablet Take 70 mg by mouth once a week. 12/28/20   [provider]  amLODipine  (NORVASC ) 10 MG tablet Take 1 tablet (10 mg total) by mouth daily. 06/14/23 07/14/23  Angiulli, Everlyn Hockey, PA-C  ascorbic acid  (VITAMIN C ) 500 MG tablet Take 0.5 tablets (250 mg total) by mouth 2 (two) times daily. 06/14/23   Angiulli, Everlyn Hockey, PA-C  aspirin  EC 81 MG tablet Take 1 tablet (81 mg total) by mouth daily. Swallow whole. 06/08/23   Magdalene School, MD  ezetimibe  (ZETIA ) 10 MG tablet Take 1  tablet (10 mg total) by mouth daily. 06/14/23   Angiulli, Everlyn Hockey, PA-C  ferrous sulfate  325 (65 FE) MG tablet Take 1 tablet (325 mg total) by mouth daily with breakfast. 06/15/23   Angiulli, Everlyn Hockey, PA-C  gabapentin  (NEURONTIN ) 300 MG capsule Take 1 capsule (300 mg total) by mouth 2 (two) times daily. 06/14/23 07/14/23  Angiulli, Everlyn Hockey, PA-C  hydrALAZINE  (APRESOLINE ) 25 MG tablet Take 25 mg by mouth 3 (three) times daily as needed (for BP >180/100). 05/15/23   [provider]  hydrALAZINE  (APRESOLINE ) 25 MG tablet Take 1 tablet (25 mg total) by mouth every 8 (eight) hours. 06/14/23   Angiulli, Everlyn Hockey, PA-C  hydrochlorothiazide  (HYDRODIURIL ) 12.5 MG tablet Take 1 tablet (12.5 mg total) by mouth daily. 06/14/23 07/14/23  Angiulli, Everlyn Hockey, PA-C  irbesartan  (AVAPRO ) 75 MG tablet Take 1 tablet (75 mg total) by mouth daily. 06/15/23   Angiulli, Everlyn Hockey, PA-C  oxyCODONE  (OXY IR/ROXICODONE ) 5 MG immediate release tablet Take 1-2 tablets (5-10 mg total) by mouth every 4 (four) hours as needed for severe pain (pain score 7-10). 06/14/23   Angiulli, Everlyn Hockey, PA-C  oxyCODONE  (OXYCONTIN ) 10 mg 12 hr tablet Take 1 tablet (10 mg total) by mouth at bedtime. 06/14/23   Angiulli, Daniel J, PA-C  polyethylene glycol powder (GLYCOLAX /MIRALAX ) 17 GM/SCOOP powder Take 1 capful (17 g) by mouth daily as needed for mild constipation. 05/15/23   Krishnan, Gokul, MD  rosuvastatin  (CRESTOR ) 40 MG tablet Take 40 mg by mouth daily. 11/21/21   [provider]  TIADYLT  ER 180 MG 24 hr capsule Take 180 mg by mouth daily.    [provider]  valsartan (DIOVAN) 160 MG tablet Take 160 mg by mouth daily. 11/18/21   [provider]  vancomycin  (VANCOREADY) 1250 MG/250ML SOLN Inject 250 mLs (1,250 mg total) into the vein daily. 06/15/23   Angiulli, Everlyn Hockey, PA-C  vancomycin  IVPB Inject 1,500 mg into the vein daily. Indication:  Bacteremia/R-groin graft infection First Dose: Yes Last Day of Therapy:   07/12/23 Labs - Sunday/Monday:  CBC/D, BMP, and vancomycin  trough. Labs - Thursday:  BMP and vancomycin  trough Labs - Once weekly: ESR and CRP Method of administration:Elastomeric Method of administration may be changed at the discretion of the patient and/or caregiver's ability to self-administer the medication ordered. 06/07/23 07/12/23  Liane Redman, MD  Zinc  Sulfate 220 (50 Zn) MG TABS Take 1 tablet (220 mg total) by mouth daily. 06/15/23   Sterling Eisenmenger, PA-C                                                                                 Vitals:   06/16/23 0609 06/16/23 1056 06/16/23 1515 06/16/23 1530  BP: (!) 133/46 (!) 146/47 (!) 137/47 (!) 114/41  Pulse: 69 76 68 62  Resp: 18 18 18 17   Temp: 98.6 F (37 C) 98.4 F (36.9 C) 98.1 F (36.7 C)   TempSrc: Oral Oral    SpO2: 100% 100% 95% 94%  Weight: 68.2 kg 66.2 kg    Height:  5' 6 (1.676 m)     Physical Exam Vitals and nursing note reviewed.  Constitutional:      General: She is not in acute distress. HENT:     Head: Normocephalic and atraumatic.     Right Ear: Hearing normal.     Left Ear: Hearing normal.     Nose: No nasal deformity.     Mouth/Throat:     Lips: Pink.   Eyes:     General: Lids are normal.     Extraocular Movements: Extraocular movements intact.    Cardiovascular:     Rate and Rhythm: Normal rate and regular rhythm.     Heart sounds: Normal heart sounds.  Pulmonary:     Effort: Pulmonary effort is normal.     Breath sounds: Normal breath sounds.  Abdominal:     General: Bowel sounds are normal. There is no distension.  Palpations: Abdomen is soft. There is no mass.     Tenderness: There is no abdominal tenderness.   Skin:    General: Skin is warm.   Neurological:     General: No focal deficit present.     Mental Status: She is alert and oriented to person, place, and time.     Cranial Nerves: Cranial nerves 2-12 are intact.   Psychiatric:        Speech: Speech normal.    Labs  on Admission: I have personally reviewed following labs and imaging studies CBC: Recent Labs  Lab 06/12/23 0518 06/15/23 1339  WBC 8.4 7.6  HGB 8.7* 8.7*  HCT 27.2* 27.5*  MCV 95.4 96.5  PLT 392 367   Basic Metabolic Panel: Recent Labs  Lab 06/12/23 0518 06/15/23 1339  NA 137 134*  K 4.0 4.1  CL 104 97*  CO2 26 26  GLUCOSE 96 119*  BUN 9 14  CREATININE 0.62 0.84  CALCIUM  9.2 8.8*   GFR: Estimated Creatinine Clearance: 61.7 mL/min (by C-G formula based on SCr of 0.84 mg/dL).  Cardiac Enzymes: Recent Labs  Lab 06/12/23 0518  CKTOTAL 603*   CBG: Recent Labs  Lab 06/09/23 2057  GLUCAP 110*   Urine analysis:    Component Value Date/Time   COLORURINE AMBER (A) 05/28/2023 2130   APPEARANCEUR HAZY (A) 05/28/2023 2130   LABSPEC 1.014 05/28/2023 2130   PHURINE 5.0 05/28/2023 2130   GLUCOSEU NEGATIVE 05/28/2023 2130   HGBUR MODERATE (A) 05/28/2023 2130   BILIRUBINUR NEGATIVE 05/28/2023 2130   KETONESUR NEGATIVE 05/28/2023 2130   PROTEINUR 100 (A) 05/28/2023 2130   NITRITE NEGATIVE 05/28/2023 2130   LEUKOCYTESUR NEGATIVE 05/28/2023 2130   Radiological Exams on Admission: No results found. Data Reviewed: Relevant notes from primary care and specialist visits, past discharge summaries as available in EHR, including Care Everywhere . Prior diagnostic testing as pertinent to current admission diagnoses, Updated medications and problem lists for reconciliation .ED course, including vitals, labs, imaging, treatment and response to treatment,Triage notes, nursing and pharmacy notes and ED provider's notes.Notable results as noted in HPI.Discussed case with EDMD/ ED APP/ or Specialty MD on call and as needed.  Assessment & Plan  >> Right groin infection:  Patient was status post right AKA, and incision felt more necrotic with exudate and patient was taken to the OR for debridement today. Wound VAC is in place.  Will continue patient's IV antibiotics with vancomycin ,  cefepime and Flagyl, or Zosyn .  Patient's prognosis is guarded as she has severe PAD and has had multiple revascularization.  Will defer to a.m. team to discuss goals of care once patient is awake and coherent and out of sedation from surgery. Continue with gentle IV fluid hydration monitor strict I's and O's indwelling Foley. Rectal tube if needed for any diarrhea.Pharmacy consult for antibiotics regimen plan.    >> PAD/right AKA: Due to poor wound healing infection. Antiplatelet therapy to be resumed 24 hours postop once appropriate. Statin therapy to be resumed once patient is able to take p.o.   >> Essential hypertension: Blood pressure 114/41 heart rate of 61 patient is afebrile at 98.1 O2 sats 92%. Currently I will hold home regimen of amlodipine , hydralazine , HCTZ, Avapro , valsartan.    DVT prophylaxis:  Heparin .   Consults:  Vascular.  Advance Care Planning:  Code Status: Limited: Do not attempt resuscitation (DNR) -DNR-LIMITED -Do Not Intubate/DNI   Verified with daughter [name].   Family Communication:  [name]: Daughter  Disposition Plan:  CIR. Severity of Illness: The appropriate patient status for this patient is INPATIENT. Inpatient status is judged to be reasonable and necessary in order to provide the required intensity of service to ensure the patient's safety. The patient's presenting symptoms, physical exam findings, and initial radiographic and laboratory data in the context of their chronic comorbidities is felt to place them at high risk for further clinical deterioration. Furthermore, it is not anticipated that the patient will be medically stable for discharge from the hospital within 2 midnights of admission.   * I certify that at the point of admission it is my clinical judgment that the patient will require inpatient hospital care spanning beyond 2 midnights from the point of admission due to high intensity of service, high risk for further deterioration and  high frequency of surveillance required.*    Meds ordered this encounter  Medications   acetaminophen  (TYLENOL ) tablet 650 mg   amLODipine  (NORVASC ) tablet 10 mg   aspirin  EC tablet 81 mg   DISCONTD: docusate sodium  (COLACE) capsule 100 mg   ezetimibe  (ZETIA ) tablet 10 mg   feeding supplement (ENSURE PLUS HIGH PROTEIN) liquid 237 mL    vanilla   gabapentin  (NEURONTIN ) capsule 300 mg   heparin  injection 5,000 Units   DISCONTD: hydrALAZINE  (APRESOLINE ) tablet 25 mg   hydrochlorothiazide  (HYDRODIURIL ) tablet 12.5 mg   irbesartan  (AVAPRO ) tablet 75 mg   DISCONTD: oxyCODONE  (Oxy IR/ROXICODONE ) immediate release tablet 5-10 mg    Refill:  0   polyethylene glycol (MIRALAX  / GLYCOLAX ) packet 17 g   DISCONTD: rosuvastatin  (CRESTOR ) tablet 40 mg   DISCONTD: vancomycin  (VANCOREADY) IVPB 1500 mg/300 mL    Indication::   Other Indication (list below)    Other Indication::   bacteremia/ graft infection   DISCONTD: heparin  injection 5,000 Units   sodium chloride  flush (NS) 0.9 % injection 10-40 mL   sodium chloride  flush (NS) 0.9 % injection 10-40 mL   Chlorhexidine  Gluconate Cloth 2 % PADS 6 each   polyethylene glycol (MIRALAX  / GLYCOLAX ) packet 17 g   DISCONTD: senna (SENOKOT) tablet 17.2 mg   ferrous sulfate  tablet 325 mg   ascorbic acid  (VITAMIN C ) tablet 250 mg   zinc  sulfate (50mg  elemental zinc ) capsule 220 mg   oxyCODONE  (OXYCONTIN ) 12 hr tablet 10 mg    Refill:  0   DISCONTD: Vancomycin  (VANCOCIN ) 1,250 mg in sodium chloride  0.9 % 250 mL IVPB    Indication::   Other Indication (list below)    Other Indication::   bacteremia/ graft infection   vancomycin  IVPB    Sig: Inject 1,250 mg into the vein daily. Indication:  BSI First Dose: Yes Last Day of Therapy:  07/12/2023 Labs - Sunday/Monday:  CBC/D, BMP, and vancomycin  trough. Labs - Thursday:  BMP and vancomycin  trough Labs - Once weekly: ESR and CRP Method of administration:Elastomeric Method of administration may be changed  at the discretion of the patient and/or caregiver's ability to self-administer the medication ordered.    Dispense:  30 Units    Refill:  0   vancomycin  (VANCOREADY) IVPB 1250 mg/250 mL    Indication::   Other Indication (list below)    Other Indication::   bacteremia/ graft infection   oxyCODONE  (Oxy IR/ROXICODONE ) immediate release tablet 5-10 mg    Refill:  0   sorbitol  70 % solution 30 mL   amLODipine  (NORVASC ) 10 MG tablet    Sig: Take 1 tablet (10 mg total) by mouth daily.  Dispense:  30 tablet    Refill:  0    Supervising Provider:   SWARTZ, ZACHARY T [2130]   ezetimibe  (ZETIA ) 10 MG tablet    Sig: Take 1 tablet (10 mg total) by mouth daily.    Dispense:  30 tablet    Refill:  0    Supervising Provider:   SWARTZ, ZACHARY T [2130]   gabapentin  (NEURONTIN ) 300 MG capsule    Sig: Take 1 capsule (300 mg total) by mouth 2 (two) times daily.    Dispense:  60 capsule    Refill:  0    Supervising Provider:   SWARTZ, ZACHARY T [2130]   hydrALAZINE  (APRESOLINE ) 25 MG tablet    Sig: Take 1 tablet (25 mg total) by mouth every 8 (eight) hours.    Dispense:  90 tablet    Refill:  0    Supervising Provider:   SWARTZ, ZACHARY T [2130]   hydrochlorothiazide  (HYDRODIURIL ) 12.5 MG tablet    Sig: Take 1 tablet (12.5 mg total) by mouth daily.    Dispense:  30 tablet    Refill:  0    Supervising Provider:   SWARTZ, ZACHARY T [2130]   irbesartan  (AVAPRO ) 75 MG tablet    Sig: Take 1 tablet (75 mg total) by mouth daily.    Dispense:  30 tablet    Refill:  0    Supervising Provider:   SWARTZ, ZACHARY T [2130]   acetaminophen  (TYLENOL ) 325 MG tablet    Sig: Take 2 tablets (650 mg total) by mouth every 6 (six) hours as needed for mild pain (pain score 1-3) or moderate pain (pain score 4-6).    Supervising Provider:   SWARTZ, ZACHARY T [2130]   ascorbic acid  (VITAMIN C ) 500 MG tablet    Sig: Take 0.5 tablets (250 mg total) by mouth 2 (two) times daily.    Dispense:  30 tablet    Refill:  0     Supervising Provider:   SWARTZ, ZACHARY T [2130]   ferrous sulfate  325 (65 FE) MG tablet    Sig: Take 1 tablet (325 mg total) by mouth daily with breakfast.    Dispense:  30 tablet    Refill:  0    Supervising Provider:   SWARTZ, ZACHARY T [2130]   oxyCODONE  (OXY IR/ROXICODONE ) 5 MG immediate release tablet    Sig: Take 1-2 tablets (5-10 mg total) by mouth every 4 (four) hours as needed for severe pain (pain score 7-10).    Dispense:  30 tablet    Refill:  0    Supervising Provider:   SWARTZ, ZACHARY T [2130]   oxyCODONE  (OXYCONTIN ) 10 mg 12 hr tablet    Sig: Take 1 tablet (10 mg total) by mouth at bedtime.    Dispense:  14 tablet    Refill:  0    Supervising Provider:   SWARTZ, ZACHARY T [2130]   vancomycin  (VANCOREADY) 1250 MG/250ML SOLN    Sig: Inject 250 mLs (1,250 mg total) into the vein daily.    Supervising Provider:   SWARTZ, ZACHARY T [2130]   Zinc  Sulfate 220 (50 Zn) MG TABS    Sig: Take 1 tablet (220 mg total) by mouth daily.    Dispense:  30 tablet    Refill:  0    Supervising Provider:   SWARTZ, ZACHARY T [[id number]]   DISCONTD: senna (SENOKOT) tablet 8.6 mg   hydrALAZINE  (APRESOLINE ) tablet 37.5 mg   senna (SENOKOT) tablet 17.2 mg  acetaminophen  (TYLENOL ) tablet 1,000 mg   OR Linked Order Group    oxyCODONE  (Oxy IR/ROXICODONE ) immediate release tablet 5 mg    oxyCODONE  (ROXICODONE ) 5 MG/5ML solution 5 mg   fentaNYL  (SUBLIMAZE ) injection 25-50 mcg   ondansetron  (ZOFRAN ) injection 4 mg   lactated ringers  infusion   OR Linked Order Group    chlorhexidine  (PERIDEX ) 0.12 % solution 15 mL    Oral care mouth rinse   acetaminophen  (TYLENOL ) 500 MG tablet    Block, Sarah E: cabinet override   DISCONTD: 0.9 % irrigation (POUR BTL)   DISCONTD: sodium chloride  irrigation 0.9 %     Orders Placed This Encounter  Procedures   US  EKG Site Rite   CK   CBC   Creatinine, serum   Comprehensive metabolic panel   CBC with Differential/Platelet   CBC   Basic metabolic panel  with GFR   Glucose, capillary   Glucose, capillary   Glucose, capillary   Glucose, capillary   Vancomycin , trough   CK   Glucose, capillary   Vancomycin , peak   Basic metabolic panel   CBC   Ambulatory referral to Physical Medicine Rehab   Diet NPO time specified Except for: Sips with Meds   Prevalon boot   Strict intake and output   Weigh on admission and daily   Vital signs   Notify physician (specify) if oral intake is less than 1000 ml within 24 hours   Assess, measure and document wounds on admission and then weekly on Wednesdays   Refer to Wound Care Standing order set for wound care, if needed.   Obtain provider order for WOC Consult for patients with Stage 3,4, unstageable and deep tissue pressure injuries   Intake and Output   Weigh patient   Initiate Oral Care Protocol   Initiate Carrier Fluid Protocol   Place TED hose   Activity as tolerated   Refer to Sidebar Report - CHG Cloths Sidebar   Patient Education: - [facility] Daily CHG Bathing   Apply needleless connector (cap)   May draw labs from central line   For first catheter occlusion notify IV team ([facility], [facility]) or provider at all other sites.   Home infusion instructions - [facility]   Method of administration may be changed at the discretion of home infusion pharmacist based upon assessment of the patient and/or caregiver's ability to self-administer the medication ordered   Anaphylaxis Kit: Provided to treat any anaphylactic reaction to the medication being provided to the patient if First Dose or when requested by physician   Change dressing on IV access line weekly and PRN   Flush IV access with Sodium Chloride  0.9% and Heparin  10 units/ml or 100 units/ml   [facility] to provide Cath Flo 2mg    [facility] pharmacist to adjust dose for Vancomycin , Aminoglycosides and other anti-infective therapies as requested by physician.   Outpatient Parenteral Antibiotic Therapy Information  Antibiotic: Vancomycin  IVPB; Indications for use: Bacteremia/Right groin graft infection; End Date: 07/12/2023   PICC line is ready to use, placement confirmed by [employer] Technology   WOUND CARE   Discharge  per PACU criteria   Vital signs   Cardiac monitoring   Notify physician (specify) Notify physician for pulse less than 60 or greater than 150, respiratory rate less than 12 or greater than 35, temperature greater than 38.5, urinary output less than .5 mL/kg/hr, systolic BP less than 50 or greater than...   Notify physician (specify) For blood glucose <  90 or >250 (after administering insulin ).   Notify physician (specify)   Vital signs Per Protocol   Notify physician (specify)   Diet Per Protocol   Pre-admission testing diagnosis   Medication Instructions Per Protocol   Discharge instructions   Void   IV Fluids Per Protocol   May use Lidocaine  1% - 2% subcutaneously prior to IV start if patient not allergic to Lidocaine    Follow Diabetes Medication Adjustment Guidelines Prior to Procedure and Surgery   Anesthesia Preoperative Order   Do not attempt resuscitation (DNR)- Limited -Do Not Intubate (DNI)   Consult to Registered Dietitian   vancomycin  per pharmacy consult   Consult to wound, ostomy, continence   Consult to wound, ostomy, continence   Consult to Registered Dietitian   Outpatient Parenteral Antibiotic Therapy Consult   Pharmacy Rehab Med Review Consult ([facility] Only)   Consult to wound, ostomy, continence   Contact precautions   OT eval and treat   PT eval and treat   Pulse oximetry, continuous   Oxygen therapy Mode or (Route): Nasal cannula   Continuous pulse oximetry   Oxygen therapy Mode or (Route): Nasal cannula; Keep O2 saturation between: >92%   Routine line care   Insert and maintain IV line   Admit to inpatient rehab    Author: Lavanda Porter, MD 12 pm- 8 pm. [facility]. 06/16/2023 3:31 PM >>Please note for any concern,or critical results after hours past  8pm please contact the [facility] floor coverage provider from 7 PM- 7 AM. For on call review [email or web address], username [email or web address] and PW: your phone number<<

## 2023-06-16 NOTE — Progress Notes (Signed)
 PROGRESS NOTE   Subjective/Complaints: No new concerns this am. Surgery scheduled today. She is NPO.    ROS: Patient denies  HA, Abdominal pain, N/V, Chest pain, SOB Reports feeling a little cold, but denies feeling like she has fever  Objective:   No results found.  Recent Labs    06/15/23 1339  WBC 7.6  HGB 8.7*  HCT 27.5*  PLT 367     Recent Labs    06/15/23 1339  NA 134*  K 4.1  CL 97*  CO2 26  GLUCOSE 119*  BUN 14  CREATININE 0.84  CALCIUM  8.8*      Intake/Output Summary (Last 24 hours) at 06/16/2023 1252 Last data filed at 06/15/2023 1300 Gross per 24 hour  Intake 236 ml  Output --  Net 236 ml         Physical Exam: Vital Signs Blood pressure (!) 146/47, pulse 76, temperature 98.4 F (36.9 C), temperature source Oral, resp. rate 18, height 5' 6 (1.676 m), weight 66.2 kg, SpO2 100%.   Spoke with her with help of interpreter-spanish   General: No acute distress, appears comfortable in bed Mood and affect are appropriate Heart: Regular rate and rhythm no rubs murmurs or extra sounds Lungs: Clear to auscultation, breathing unlabored, no rales or wheezes Abdomen: Positive bowel sounds, soft nontender to palpation, nondistended Extremities: BUE and LLE No clubbing, cyanosis, or edema  Psych: Pt's affect is appropriate. Pt is cooperative Skin: Right groin wound wound with dry dressing,   Right AKA- with dry dressing-    Images 6/12      Neuro:  Alert and oriented x 3. Normal insight and awareness. Intact Memory. Normal language and speech. Cranial nerve exam unremarkable. MMT: 5/5 BUE. Musculoskeletal: right AK sensitive to touch.  swollen Prior neuro assessment is c/w today's exam 06/16/2023.   Assessment/Plan: 1. Functional deficits which require 3+ hours per day of interdisciplinary therapy in a comprehensive inpatient rehab setting. Physiatrist is providing close team supervision  and 24 hour management of active medical problems listed below. Physiatrist and rehab team continue to assess barriers to discharge/monitor patient progress toward functional and medical goals  Care Tool:  Bathing    Body parts bathed by patient: Right arm, Left arm, Chest, Abdomen, Front perineal area, Buttocks, Right upper leg, Left upper leg, Left lower leg, Face     Body parts n/a: Right lower leg   Bathing assist Assist Level: Independent with assistive device     Upper Body Dressing/Undressing Upper body dressing   What is the patient wearing?: Pull over shirt    Upper body assist Assist Level: Independent with assistive device    Lower Body Dressing/Undressing Lower body dressing      What is the patient wearing?: Pants     Lower body assist Assist for lower body dressing: Independent with assitive device     Toileting Toileting    Toileting assist Assist for toileting: Independent with assistive device     Transfers Chair/bed transfer  Transfers assist  Chair/bed transfer activity did not occur: Safety/medical concerns  Chair/bed transfer assist level: Independent with assistive device     Locomotion Ambulation   Ambulation  assist      Assist level: Independent with assistive device Assistive device: Walker-rolling Max distance: 30 ft   Walk 10 feet activity   Assist     Assist level: Independent with assistive device Assistive device: Walker-rolling   Walk 50 feet activity   Assist Walk 50 feet with 2 turns activity did not occur: Safety/medical concerns (fatigue)         Walk 150 feet activity   Assist Walk 150 feet activity did not occur: Safety/medical concerns         Walk 10 feet on uneven surface  activity   Assist Walk 10 feet on uneven surfaces activity did not occur: Safety/medical concerns   Assist level: Contact Guard/Touching assist Assistive device: Walker-rolling   Wheelchair     Assist Is the  patient using a wheelchair?: Yes Type of Wheelchair: Manual    Wheelchair assist level: Independent Max wheelchair distance: 150    Wheelchair 50 feet with 2 turns activity    Assist        Assist Level: Independent   Wheelchair 150 feet activity     Assist      Assist Level: Independent   Blood pressure (!) 146/47, pulse 76, temperature 98.4 F (36.9 C), temperature source Oral, resp. rate 18, height 5' 6 (1.676 m), weight 66.2 kg, SpO2 100%.  Medical Problem List and Plan: 1. Functional deficits secondary to right AKA  and I&D groin with sartorius muscle flap 05/31/2023 after failed revascularization procedure             -patient may not shower while wound vac is in place             -ELOS/Goals: 8-11 days S             -Continue PT, OT  -Discharge delayed due to need for OR treatment  -OR today with vascular surgery for R groin debridement, discharge delayed due to this. Pt NPO, will hold ASA today      2.  Antithrombotics: -DVT/anticoagulation:  Pharmaceutical: Heparin              -antiplatelet therapy: continue Aspirin  81 mg daily   3. Pain Management: continue Neurontin  300 mg twice daily, oxycodone  as needed  add low dose oxycontin  at noc- was helpful with sleeping throught the night , would anticipate using only during CIR stay  6/9 do no see a specific medication for sleep, I think she got oxycodone  that helped her sleep quickly, continue current regimen for now   6/10 Will change PRN oxycodone  to Q4h  6/11-13 pain controlled, continue current regimen for now 4. Mood/Behavior/Sleep: Provide emotional support             -antipsychotic agents: N/A   5. Neuropsych/cognition: This patient is capable of making decisions on her own behalf.   6. Skin/Wound Care:  6/5-Wound VAC right groin--per surgery change on Monday by [facility] RN.        -continue dry dressing to AKA incision, monitor blistering   7. Fluids/Electrolytes/Nutrition: Routine follow-up and  outs follow-up chemistries   6/5-mild hyponatremia- 134 (sl improvement), continue to monitor  -albumin  only 1.9--->protein supps daily  6/9 Na up to 137- monitor  8.  Postoperative anemia.  Follow-up CBC   9.ID/Sepsis. Group A Streptococcus vascular graft infection/bacteremia.  Daptomycin  changed to vancomycin  due to elevated CK.  Follow-up infectious disease.  Await long-term plan of care on antibiotic   6/5- wbcs fairly stable at 14k       -  CK ordered for Friday-603 6/9 improved  ID note on 6/4- Vancomycin  through 07/12/23 10.  Hypertension.  BP reviewed: currently systolic is elevated and diastolic is soft: Continue Avapro  75 mg daily, Norvasc  10 mg daily, hydralazine  25 mg every 8 hours, HCTZ 12.5 mg daily.  Monitor with increased mobility  6/12 BP has been a little elevated, increase hydralazine  to 37.5mg  qh8  6/13 monitor response to med change    Vitals:   06/16/23 0609 06/16/23 1056  BP: (!) 133/46 (!) 146/47  Pulse: 69 76  Resp: 18 18  Temp: 98.6 F (37 C) 98.4 F (36.9 C)  SpO2: 100% 100%    11.  Hyperlipidemia.  Will discuss holding Crestor  with pharmacy due to elevated CK.   12. Hyperglycemia: hemoglobin A1c reviewed and is lower than normal, continue to monitor 13. ABLA:  -hgb 8.6. may have reached nadir--recheck Monday   -Fe++ supp  -6/13 HGB stable yesterday 8.7 14. Slow transit constipation?  6/5-?BM on 6/3. pt seems to be struggling to have BM this morning  -f/u with nursing  -add daily miralax  and senna at night  -6/9 Pt reports BM yesterday, continue to follow  -6/10 Pt reports BM yesterday, do not see documented, add sorbitol  PRN  -6/11 LBM yesterday loose- will decrease senokot to 1 tab  -6/12 LBM 2 days ago, will go back to 2 tabs senokot  -6/13 Pt denies feeling constipated, consider additional medication if no BM today,she has sorbtiol prn can use tonight if needed     LOS: 9 days A FACE TO FACE EVALUATION WAS PERFORMED  Katherine Archer 06/16/2023, 12:52 PM

## 2023-06-16 NOTE — Progress Notes (Signed)
 Called several times to get report on patient for report. No answer. Msged assigned RN, no response. Transport will be sent for patient soon.

## 2023-06-16 NOTE — Op Note (Signed)
 Patient name: Katherine Archer MRN: [id number] DOB: 1955-04-24 Sex: female  06/16/2023 Pre-operative Diagnosis: #1: Right groin infection.  #2: Nonhealing right above-knee amputation Post-operative diagnosis:  Same Surgeon:  Gareld June Assistants:  Naida [place], PA Procedure:   #1: I&D of right groin including subcutaneous tissue   #2: Placement of first 38 cm skin substitute (Kerecis)   #3: Placement of wound VAC to the right groin   #4: Redo right above-knee amputation Anesthesia:  General Blood Loss:  minimal Specimens: Right leg  Findings: The sartorius muscle flap was no longer covering the remaining femoral-femoral and right axillary femoral graft.  I irrigated the wound with 3 L of saline.  There was no obvious infection.  Despite lack of tissue coverage to the graft, there was no bleeding in the wound bed actually looked relatively healthy.  I did not feel there were any options for additional muscle flaps and so I elected to place Kerecis powder in the wound and then a sheet of Kerecis over top of the existing graft.  I closed the subcutaneous tissue and skin with interrupted 3-0 nylons and placed a blue sponge wound VAC.  The nonviable distal portion of her above-knee amputation was removed and I performed a higher above-knee amputation.  Muscle all looked very healthy  Indications: The patient underwent emergent excision of a right femoral popliteal bypass graft secondary to to infection.  She then went on to have an ischemic leg which required an above-knee amputation.  I placed a sartorius flap to cover her remaining graft in the right groin.  She has nonhealing of her groin and ischemic changes to her above-knee stump.  I have discussed proceeding with a revision of her amputation to a higher level and washout of her groin  Procedure:  The patient was identified in the holding area and taken to [facility] OR ROOM 16  The patient was then placed supine on the table. general anesthesia was  administered.  The patient was prepped and draped in the usual sterile fashion.  A time out was called and antibiotics were administered.  Due to the complexity of the procedure, an experienced assistant was necessary.  He helped with the irrigation and debridement of the wound as well as the amputation.  Initially began in the groin.  3 L of saline were used to irrigate the groin.  The tissue remaining all looked healthy and perfused.  There was no evidence of infection.  Unfortunately the muscle flap which was stretched because of its length to try to cover the graft had retracted somewhat in the graft and the upper portion of the wound was fully exposed.  I elected to give this 1 more chance at healing although she is at high risk for having this excised.  I placed Kerecis powder throughout the wound bed and then took a sheet of Kerecis and placed it over top of the remaining graft.  There was no tissue to get closure of the graft and so I used interrupted 3-0 nylon sutures to reapproximate the skin edges.  A blue sponge wound VAC was then placed and had a good seal.  It was connected to 75 mm of pressure.  Attention was then turned towards the above-knee amputation.  The patient had indicated where her pain began and so I made the incision at this level.  A fishmouth incision was made.  Cautery was used to divide the subcutaneous tissue.  The femur was then circumferentially exposed.  A  power saw was used to transect the femur.  The bone edges were smoothed with a rasp.  The remaining muscle was divided.  I clamped the neurovascular bundle and removed the excised amputation stump.  I then individually isolated the neurovascular bundle and ligated these with 2-0 silk proximal to the cut edge of the bone.  The wound was then irrigated.  Hemostasis was achieved.  The fascia was reapproximated with interrupted 2-0 Vicryl.  The skin was closed with staples.  Sterile dressings were applied.  The patient was  successfully extubated and taken recovery in stable condition.   Disposition: To PACU stable   V. Gareld June, M.D., [facility] [place] Office: [phone] Pager:  [phone]

## 2023-06-16 NOTE — Progress Notes (Addendum)
  Progress Note    06/16/2023 8:01 AM * No surgery found *  Subjective:  pain R AKA.  Having chills   Vitals:   06/15/23 2040 06/16/23 0609  BP: (!) 135/51 (!) 133/46  Pulse: 72 69  Resp: 18 18  Temp: 98.6 F (37 C) 98.6 F (37 C)  SpO2: 99% 100%   Physical Exam: Lungs:  non labored Incisions:  R AKA with pain, central drainage; medial necrosis Extremities:  dressing left in place R groin Neurologic: A&O  CBC    Component Value Date/Time   WBC 7.6 06/15/2023 1339   RBC 2.85 (L) 06/15/2023 1339   HGB 8.7 (L) 06/15/2023 1339   HGB 14.2 03/23/2021 0928   HCT 27.5 (L) 06/15/2023 1339   HCT 42.1 03/23/2021 0928   PLT 367 06/15/2023 1339   PLT 135 (L) 03/23/2021 0928   MCV 96.5 06/15/2023 1339   MCV 93 03/23/2021 0928   MCH 30.5 06/15/2023 1339   MCHC 31.6 06/15/2023 1339   RDW 16.1 (H) 06/15/2023 1339   RDW 12.0 03/23/2021 0928   LYMPHSABS 2.2 06/08/2023 0509   LYMPHSABS 1.9 03/23/2021 0928   MONOABS 1.0 06/08/2023 0509   EOSABS 0.1 06/08/2023 0509   EOSABS 0.3 03/23/2021 0928   BASOSABS 0.0 06/08/2023 0509   BASOSABS 0.0 03/23/2021 0928    BMET    Component Value Date/Time   NA 134 (L) 06/15/2023 1339   NA 138 01/15/2020 1128   K 4.1 06/15/2023 1339   CL 97 (L) 06/15/2023 1339   CO2 26 06/15/2023 1339   GLUCOSE 119 (H) 06/15/2023 1339   BUN 14 06/15/2023 1339   BUN 8 01/15/2020 1128   CREATININE 0.84 06/15/2023 1339   CALCIUM  8.8 (L) 06/15/2023 1339   GFRNONAA >60 06/15/2023 1339   GFRAA 80 01/15/2020 1128    INR    Component Value Date/Time   INR 1.5 (H) 05/29/2023 0030     Intake/Output Summary (Last 24 hours) at 06/16/2023 0801 Last data filed at 06/15/2023 1300 Gross per 24 hour  Intake 236 ml  Output --  Net 236 ml     Assessment/Plan:  68 y.o. female is s/p R leg bypass excision with subsequent AKA   R AKA incision with central drainage and medial necrosis; high risk for revision; will discuss timing with Dr. Charlotte Cookey R groin  dressing left in place Plan is for return to OR today for debridement of R groin; will discuss R AKA revision with Dr. Charlotte Cookey; the above was discussed with the daughter who was present today and she is agreeable to proceed   Cordie Deters, PA-C [facility] [phone] 06/16/2023 8:01 AM  Spoke with patient and daughter about right AKA revision.  Will also wash out right groin.  May need graft excision and left ax-fem.  D/W family  Gareld June

## 2023-06-16 NOTE — Interval H&P Note (Signed)
 History and Physical Interval Note:  06/16/2023 1:27 PM  Katherine Archer  has presented today for surgery, with the diagnosis of Infection surgical site Right groin.  The various methods of treatment have been discussed with the patient and family. After consideration of risks, benefits and other options for treatment, the patient has consented to  Procedure(s) with comments: IRRIGATION AND DEBRIDEMENT WOUND (Right) - Right Groin as a surgical intervention.  The patient's history has been reviewed, patient examined, no change in status, stable for surgery.  I have reviewed the patient's chart and labs.  Questions were answered to the patient's satisfaction.     Gareld June

## 2023-06-17 DIAGNOSIS — T8149XA Infection following a procedure, other surgical site, initial encounter: Secondary | ICD-10-CM | POA: Diagnosis not present

## 2023-06-17 MED ORDER — VITAMIN C 500 MG PO TABS
250.0000 mg | ORAL_TABLET | Freq: Two times a day (BID) | ORAL | Status: DC
  Administered 2023-06-17 – 2023-07-04 (×33): 250 mg via ORAL
  Filled 2023-06-17 (×33): qty 1

## 2023-06-17 MED ORDER — ENSURE PLUS HIGH PROTEIN PO LIQD
237.0000 mL | Freq: Two times a day (BID) | ORAL | Status: DC
  Administered 2023-06-17 – 2023-07-03 (×28): 237 mL via ORAL
  Filled 2023-06-17: qty 237

## 2023-06-17 MED ORDER — ZINC SULFATE 220 (50 ZN) MG PO CAPS
220.0000 mg | ORAL_CAPSULE | Freq: Every day | ORAL | Status: AC
  Administered 2023-06-17 – 2023-06-23 (×6): 220 mg via ORAL
  Filled 2023-06-17 (×6): qty 1

## 2023-06-17 MED ORDER — VANCOMYCIN HCL 1250 MG/250ML IV SOLN
1250.0000 mg | INTRAVENOUS | Status: DC
  Administered 2023-06-18 – 2023-06-23 (×6): 1250 mg via INTRAVENOUS
  Filled 2023-06-17 (×7): qty 250

## 2023-06-17 MED ORDER — ACETAMINOPHEN 325 MG PO TABS
650.0000 mg | ORAL_TABLET | Freq: Four times a day (QID) | ORAL | Status: DC | PRN
  Administered 2023-06-18 – 2023-06-26 (×6): 650 mg via ORAL
  Filled 2023-06-17 (×6): qty 2

## 2023-06-17 NOTE — Progress Notes (Signed)
 Initial Nutrition Assessment  DOCUMENTATION CODES:   Not applicable  INTERVENTION:   - Ensure Plus High Protein po BID, each supplement provides 350 kcal and 20 grams of protein  - [place] yogurt TID with meals for added protein  - Magic cup TID with meals, each supplement provides 290 kcal and 9 grams of protein  - Vitamin C  250 mg BID to support wound healing  - Zinc  sulfate 220 mg daily x 7 days to support wound healing (pt received zinc  sulfate from 6/05 to discharge from CIR on 6/13, so will only order for 7 days to decrease risk for copper deficiency)  NUTRITION DIAGNOSIS:   Increased nutrient needs related to wound healing as evidenced by estimated needs.  GOAL:   Patient will meet greater than or equal to 90% of their needs  MONITOR:   PO intake, Supplement acceptance, Labs, Weight trends, Skin  REASON FOR ASSESSMENT:   Consult Wound healing  ASSESSMENT:   68 year old female who was admitted to acute care from CIR on 06/16/23 due to R groin surgical site infection and planned I&D. 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  6/13 - s/p I&D of right groin, placement of skin substitute, placement of wound VAC to right groin, redo R AKA; full liquid diet  Unable to reach pt via phone call to room. Pt seen recently on 6/05 and 6/09 by RD team during CIR admission. Pt diagnosed with severe malnutrition in the setting of chronic illness. Suspect malnutrition persists but unable to confirm without NFPE. Previous NFPE completed on 6/05 showed severe fat and muscle depletions in all areas assessed.  During previous RD assessments, pt reported losing weight recently but was unsure how much. RD team has spoken with pt and daughter regarding importance of adequate protein intake. Pt likes vanilla Ensure Plus High Protein and all flavors of Greek yogurt. RD also added Magic Cups to meal trays. Will reorder these supplements as  well as vitamin C  and zinc  to support wound healing. Pt currently on a full liquid diet with no meal completions charted.  Medications reviewed and include: IV abx  Labs reviewed.  NUTRITION - FOCUSED PHYSICAL EXAM:  Unable to complete. RD working remotely.  Diet Order:   Diet Order             Diet full liquid Room service appropriate? Yes; Fluid consistency: Thin  Diet effective now                   EDUCATION NEEDS:   Not appropriate for education at this time  Skin:  Skin Assessment:  Skin Integrity Issues: Wound VAC: R groin Other: R AKA s/p redo  Last BM:  06/13/23  Height:   Ht Readings from Last 1 Encounters:  06/16/23 5' 6 (1.676 m)    Weight:   Wt Readings from Last 1 Encounters:  06/17/23 64.1 kg    BMI:  Body mass index is 22.81 kg/m.  Estimated Nutritional Needs:   Kcal:  1800-2000  Protein:  85-100 grams  Fluid:  >1.8 L    Katherine Headland, MS, RD, LDN Registered Dietitian II Please see AMiON for contact information.

## 2023-06-17 NOTE — Progress Notes (Signed)
 PROGRESS NOTE    Katherine Archer  ZOX:[id number] DOB: [birth year] DOA: 06/16/2023 PCP: Barbar Levine, MD   Brief Narrative:  Katherine Archer is a 68 y.o. female with past medical history of hypertension, CVA, hyperlipidemia, prior tobacco abuse(quit smoking 3 years ago), PAD maintained on Eliquis  with multiple revascularizations including recent right femoral-popliteal BGD 05/12/2023 with Dr. Charlotte Cookey who discharged to inpatient rehab.  Wound VAC discontinued 06/15/2023 with concern for ongoing infection at surgical site, vascular surgery requested readmission with medicine team given likely need for irrigation and debridement of right groin wound.  Assessment & Plan:   Principal Problem:   Surgical site infection  Rule out right groin infection PAD with recent femoropopliteal surgery 05/12/2023 Nonhealing right AKA Surgical note indicates 'no obvious infection with irrigation, noted poor tissue coverage of the graft -wound VAC replaced -redo AKA' - Defer to surgical team for further insight recommendations, continues on vancomycin  for coverage - Wound VAC Sharyn exchange per surgical team   HTN -labile but improving, hypertensive events likely driven by pain and discomfort HLD -low-fat diet, Tobacco abuse (former)  DVT prophylaxis: heparin  injection 5,000 Units Start: 06/16/23 2200 Code Status:   Code Status: Limited: Do not attempt resuscitation (DNR) -DNR-LIMITED -Do Not Intubate/DNI  Family Communication: None present  Status is: Inpatient  Dispo: The patient is from: Home              Anticipated d/c is to: To be determined              Anticipated d/c date is: To be determined pending surgical course              Patient currently not medically stable for discharge  Consultants:  Vascular surgery  Procedures:  I&D/washout 06/16/2023 AKA reevaluation 06/16/2023  Antimicrobials:  Vancomycin   Subjective: No acute issues or events overnight, pain currently well-controlled denies  nausea vomiting diarrhea constipation headache fevers chills or chest pain  Objective: Vitals:   06/17/23 0323 06/17/23 0500 06/17/23 0600 06/17/23 0731  BP: (!) 172/57   (!) 156/57  Pulse: 87 80 72   Resp: 20 15 (!) 22   Temp: 100 F (37.8 C)   98.2 F (36.8 C)  TempSrc: Oral   Oral  SpO2: 99% 97% 98%   Weight:        Intake/Output Summary (Last 24 hours) at 06/17/2023 0754 Last data filed at 06/17/2023 0408 Gross per 24 hour  Intake --  Output 1250 ml  Net -1250 ml   Filed Weights   06/17/23 0116  Weight: 64.1 kg    Examination:  General:  Pleasantly resting in bed, No acute distress. HEENT:  Normocephalic atraumatic.  Sclerae nonicteric, noninjected.  Extraocular movements intact bilaterally. Neck:  Without mass or deformity.  Trachea is midline. Lungs:  Clear to auscultate bilaterally without rhonchi, wheeze, or rales. Heart:  Regular rate and rhythm.  Without murmurs, rubs, or gallops. Abdomen:  Soft, nontender, nondistended.  Without guarding or rebound. Extremities: Right AKA, wound VAC to groin in place without leak Skin:  Warm and dry, no erythema.   Data Reviewed: I have personally reviewed following labs and imaging studies  CBC: Recent Labs  Lab 06/12/23 0518 06/15/23 1339  WBC 8.4 7.6  HGB 8.7* 8.7*  HCT 27.2* 27.5*  MCV 95.4 96.5  PLT 392 367   Basic Metabolic Panel: Recent Labs  Lab 06/12/23 0518 06/15/23 1339 06/16/23 1759  NA 137 134*  --   K 4.0 4.1  --  CL 104 97*  --   CO2 26 26  --   GLUCOSE 96 119*  --   BUN 9 14  --   CREATININE 0.62 0.84  --   CALCIUM  9.2 8.8*  --   MG  --   --  2.0   GFR: Estimated Creatinine Clearance: 61.7 mL/min (by C-G formula based on SCr of 0.84 mg/dL).  Liver Function Tests: Recent Labs  Lab 06/16/23 1759  AST 34  ALT 30  ALKPHOS 70  BILITOT 0.8  PROT 7.7  ALBUMIN  2.5*   Cardiac Enzymes: Recent Labs  Lab 06/12/23 0518  CKTOTAL 603*    No results found for this or any previous  visit (from the past 240 hours).       Radiology Studies: No results found.      Scheduled Meds:  Chlorhexidine  Gluconate Cloth  6 each Topical Daily   heparin   5,000 Units Subcutaneous Q12H   oxyCODONE   10 mg Oral QHS   sodium chloride  flush  3 mL Intravenous Q12H   Continuous Infusions:  vancomycin        LOS: 1 day   Time spent:  Haydee Lipa, DO [facility]  If 7PM-7AM, please contact night-coverage [email or web address]  06/17/2023, 7:54 AM

## 2023-06-17 NOTE — Anesthesia Postprocedure Evaluation (Signed)
 Anesthesia Post Note  Patient: Mariona Scholes  Procedure(s) Performed: IRRIGATION AND DEBRIDEMENT WOUND (Right: Groin) REVISION AMPUTATION, ABOVE THE KNEE (Right: Leg Upper) APPLICATION, SKIN SUBSTITUTE (Right: Leg Upper) APPLICATION, WOUND VAC (Right: Groin)     Patient location during evaluation: PACU Anesthesia Type: General Level of consciousness: awake and alert Pain management: pain level controlled Vital Signs Assessment: post-procedure vital signs reviewed and stable Respiratory status: spontaneous breathing, nonlabored ventilation, respiratory function stable and patient connected to nasal cannula oxygen Cardiovascular status: stable and blood pressure returned to baseline Anesthetic complications: no   No notable events documented.  Last Vitals:  Vitals:   06/16/23 1615 06/16/23 1630  BP: (!) 127/51 (!) 139/53  Pulse: 65 62  Resp: 17 17  Temp:  36.7 C  SpO2: 97% 97%    Last Pain:  Vitals:   06/16/23 1630  TempSrc:   PainSc: Asleep                 Juventino Oppenheim

## 2023-06-17 NOTE — Progress Notes (Signed)
 [facility] night cross cover note:  Prn acetaminophen  ordered for fever and this patient is hospitalized with known infection in the form of right groin infection, on iv abx.    Camelia Cavalier, DO Hospitalist

## 2023-06-17 NOTE — Progress Notes (Signed)
 Pharmacy Antibiotic Note  Katherine Archer is a 68 y.o. female admitted on 06/07/23 with worsening RLE pain and swelling. Pt had a fem-pop bypass with redo and recently worsening swelling. Found to have bacteremia and right groin graft infection. Switched from daptomycin  to vancomycin  for increasing CK  2100 > 3200. Discharged to rehab on 6/4 with plan to continue vancomycin  thru 7/9. Transferred back to inpatient for I&D on 6/13. Pharmacy has been consulted to continue antibiotics.  Last vanc levels obtained on 6/8-9, which resulted in dose decrease. Renal function stable. Appears vancomycin  dose not given on 6/10. Plan to recheck levels after 7 consecutive days of vancomycin .  Plan: Continue vancomycin  1250mg  IV Q24h Obtain vanc levels weekly (next levels due 6/17) F/u cultures, clinical progress, levels as indicated End date: 07/12/23   Weight: 64.1 kg (141 lb 5 oz)  Temp (24hrs), Avg:98.7 F (37.1 C), Min:98 F (36.7 C), Max:100 F (37.8 C)  Recent Labs  Lab 06/11/23 1334 06/12/23 0518 06/12/23 0907 06/15/23 1339  WBC  --  8.4  --  7.6  CREATININE  --  0.62  --  0.84  VANCOTROUGH  --   --  10*  --   VANCOPEAK 43*  --   --   --     Estimated Creatinine Clearance: 61.7 mL/min (by C-G formula based on SCr of 0.84 mg/dL).    Allergies  Allergen Reactions   Penicillins Other (See Comments)    Shaky and Dizziness 20 years ago    Antimicrobials this admission: Vanc 5/26 > 5/27, 6/3 >> Zosyn  5/26 > 5/27 PCN 5/27 > 5/29 Zyvox  5/27 > 5/29 Dapto 5/29 > 6/03  Microbiology results: 5/25 BCx: GPC in 1/2 bottles, BCID with strep pyogenes 5/26 Abscess Cx: MRSA, strep pyogenes 5/27 BCx: negative  Thank you for allowing pharmacy to be a part of this patient's care.  Abelina Abide, PharmD PGY1 Pharmacy Resident 06/17/2023 2:23 PM

## 2023-06-17 NOTE — Plan of Care (Signed)

## 2023-06-18 DIAGNOSIS — T8149XA Infection following a procedure, other surgical site, initial encounter: Secondary | ICD-10-CM | POA: Diagnosis not present

## 2023-06-18 MED ORDER — OXYCODONE HCL 5 MG PO TABS
5.0000 mg | ORAL_TABLET | Freq: Four times a day (QID) | ORAL | Status: DC | PRN
  Administered 2023-06-18 – 2023-06-27 (×23): 5 mg via ORAL
  Filled 2023-06-18 (×24): qty 1

## 2023-06-18 MED ORDER — HYDROMORPHONE HCL 1 MG/ML IJ SOLN
1.0000 mg | INTRAMUSCULAR | Status: DC | PRN
  Administered 2023-06-18 – 2023-07-01 (×36): 1 mg via INTRAVENOUS
  Filled 2023-06-18 (×37): qty 1

## 2023-06-18 NOTE — Progress Notes (Signed)
 PROGRESS NOTE    Katherine Archer  ZOX:[id number] DOB: May 23, 1955 DOA: 06/16/2023 PCP: Barbar Levine, MD   Brief Narrative:  Katherine Archer is a 68 y.o. female with past medical history of hypertension, CVA, hyperlipidemia, prior tobacco abuse(quit smoking 3 years ago), PAD maintained on Eliquis  with multiple revascularizations including recent right femoral-popliteal BGD 05/12/2023 with Dr. Charlotte Cookey who discharged to inpatient rehab.  Wound VAC discontinued 06/15/2023 with concern for ongoing infection at surgical site, vascular surgery requested readmission with medicine team given likely need for irrigation and debridement of right groin wound.  Assessment & Plan:   Principal Problem:   Surgical site infection  Rule out right groin infection PAD with recent femoropopliteal surgery 05/12/2023 Nonhealing right AKA Surgical note indicates 'no obvious infection with irrigation, noted poor tissue coverage of the graft -wound VAC replaced -redo AKA' - Defer to surgical team for further insight recommendations, continues on vancomycin  for coverage - Wound VAC exchange per surgical team - Decrease narcotic burden as tolerated/IV for breakthrough pain only  HTN -labile but improving, hypertensive events likely driven by pain and discomfort HLD -low-fat diet, Tobacco abuse (former)  DVT prophylaxis: heparin  injection 5,000 Units Start: 06/16/23 2200 Code Status:   Code Status: Limited: Do not attempt resuscitation (DNR) -DNR-LIMITED -Do Not Intubate/DNI  Family Communication: None present  Status is: Inpatient  Dispo: The patient is from: Home              Anticipated d/c is to: To be determined              Anticipated d/c date is: To be determined pending surgical course              Patient currently not medically stable for discharge  Consultants:  Vascular surgery  Procedures:  I&D/washout 06/16/2023 AKA reevaluation 06/16/2023  Antimicrobials:  Vancomycin   Subjective: No acute  issues or events overnight, pain currently well-controlled denies nausea vomiting diarrhea constipation headache fevers chills or chest pain  Objective: Vitals:   06/18/23 0034 06/18/23 0125 06/18/23 0203 06/18/23 0500  BP:   (!) 140/48   Pulse: 76 66 64   Resp: 20 (!) 21 20   Temp:  98.3 F (36.8 C) 98.9 F (37.2 C)   TempSrc:   Oral   SpO2: 98% 98% 98%   Weight: 63.6 kg   63.6 kg    Intake/Output Summary (Last 24 hours) at 06/18/2023 0754 Last data filed at 06/18/2023 0700 Gross per 24 hour  Intake --  Output 900 ml  Net -900 ml   Filed Weights   06/17/23 0116 06/18/23 0034 06/18/23 0500  Weight: 64.1 kg 63.6 kg 63.6 kg    Examination:  General:  Pleasantly resting in bed, No acute distress. HEENT:  Normocephalic atraumatic.  Sclerae nonicteric, noninjected.  Extraocular movements intact bilaterally. Neck:  Without mass or deformity.  Trachea is midline. Lungs:  Clear to auscultate bilaterally without rhonchi, wheeze, or rales. Heart:  Regular rate and rhythm.  Without murmurs, rubs, or gallops. Abdomen:  Soft, nontender, nondistended.  Without guarding or rebound. Extremities: Right AKA, wound VAC to groin in place without leak Skin:  Warm and dry, no erythema.   Data Reviewed: I have personally reviewed following labs and imaging studies  CBC: Recent Labs  Lab 06/12/23 0518 06/15/23 1339  WBC 8.4 7.6  HGB 8.7* 8.7*  HCT 27.2* 27.5*  MCV 95.4 96.5  PLT 392 367   Basic Metabolic Panel: Recent Labs  Lab 06/12/23 0518 06/15/23 1339  06/16/23 1759  NA 137 134*  --   K 4.0 4.1  --   CL 104 97*  --   CO2 26 26  --   GLUCOSE 96 119*  --   BUN 9 14  --   CREATININE 0.62 0.84  --   CALCIUM  9.2 8.8*  --   MG  --   --  2.0   GFR: Estimated Creatinine Clearance: 61.7 mL/min (by C-G formula based on SCr of 0.84 mg/dL).  Liver Function Tests: Recent Labs  Lab 06/16/23 1759  AST 34  ALT 30  ALKPHOS 70  BILITOT 0.8  PROT 7.7  ALBUMIN  2.5*   Cardiac  Enzymes: Recent Labs  Lab 06/12/23 0518  CKTOTAL 603*    No results found for this or any previous visit (from the past 240 hours).       Radiology Studies: No results found.      Scheduled Meds:  ascorbic acid   250 mg Oral BID   Chlorhexidine  Gluconate Cloth  6 each Topical Daily   feeding supplement  237 mL Oral BID BM   heparin   5,000 Units Subcutaneous Q12H   oxyCODONE   10 mg Oral QHS   sodium chloride  flush  3 mL Intravenous Q12H   zinc  sulfate (50mg  elemental zinc )  220 mg Oral Daily   Continuous Infusions:  vancomycin        LOS: 2 days   Time spent:  Haydee Lipa, DO [facility]  If 7PM-7AM, please contact night-coverage [email or web address]  06/18/2023, 7:54 AM

## 2023-06-18 NOTE — Progress Notes (Addendum)
  Progress Note    06/18/2023 8:20 AM * No surgery found *  Subjective:  she says she feels okay. Her right leg is a little sore    Vitals:   06/18/23 0203 06/18/23 0754  BP: (!) 140/48 (!) 141/46  Pulse: 64   Resp: 20   Temp: 98.9 F (37.2 C) 98.7 F (37.1 C)  SpO2: 98%     Physical Exam: General:  sitting up in bed, NAD Lungs:  nonlabored Incisions:  right groin with wound vac with good seal. R AKA intact with minimal serosanguineous drainage   CBC    Component Value Date/Time   WBC 7.6 06/15/2023 1339   RBC 2.85 (L) 06/15/2023 1339   HGB 8.7 (L) 06/15/2023 1339   HGB 14.2 03/23/2021 0928   HCT 27.5 (L) 06/15/2023 1339   HCT 42.1 03/23/2021 0928   PLT 367 06/15/2023 1339   PLT 135 (L) 03/23/2021 0928   MCV 96.5 06/15/2023 1339   MCV 93 03/23/2021 0928   MCH 30.5 06/15/2023 1339   MCHC 31.6 06/15/2023 1339   RDW 16.1 (H) 06/15/2023 1339   RDW 12.0 03/23/2021 0928   LYMPHSABS 2.2 06/08/2023 0509   LYMPHSABS 1.9 03/23/2021 0928   MONOABS 1.0 06/08/2023 0509   EOSABS 0.1 06/08/2023 0509   EOSABS 0.3 03/23/2021 0928   BASOSABS 0.0 06/08/2023 0509   BASOSABS 0.0 03/23/2021 0928    BMET    Component Value Date/Time   NA 134 (L) 06/15/2023 1339   NA 138 01/15/2020 1128   K 4.1 06/15/2023 1339   CL 97 (L) 06/15/2023 1339   CO2 26 06/15/2023 1339   GLUCOSE 119 (H) 06/15/2023 1339   BUN 14 06/15/2023 1339   BUN 8 01/15/2020 1128   CREATININE 0.84 06/15/2023 1339   CALCIUM  8.8 (L) 06/15/2023 1339   GFRNONAA >60 06/15/2023 1339   GFRAA 80 01/15/2020 1128    INR    Component Value Date/Time   INR 1.5 (H) 05/29/2023 0030     Intake/Output Summary (Last 24 hours) at 06/18/2023 0820 Last data filed at 06/18/2023 0700 Gross per 24 hour  Intake --  Output 900 ml  Net -900 ml      Assessment/Plan:  68 y.o. female is 2 days post op, s/p: right groin irrigation and debridement, placement of kerecis and wound vac to the right groin, and revision of  right AKA   -She is feeling okay this morning. Her right AKA is moderately tender to touch -Right groin with wound vac with good seal -Right AKA intact with staples and mild serosanguineous drainage. Continue dry dressing changes as needed -She was febrile last night with Tmax 102.60F. CBC ordered this morning, not obtained yet -Vancomycin  ordered by primary -Continue wound vac therapy for now. Wound vac change likely at bedside early this week   Deneise Finlay, PA-C [facility] [phone] 06/18/2023 8:20 AM  I agree with the above.  The patient was febrile overnight.  Her stump does not appear to be the source.  I was then concerned about her groin.  We will change her VAC tomorrow.  I did discuss the possibility with her daughter that we may need to consider resection and removal of her remaining axillary femoral graft and simultaneously place a new left axillary femoral graft.  I will see how she does with regards to her fever curve.  Gareld June

## 2023-06-18 NOTE — Plan of Care (Signed)
  Problem: RH Balance Goal: LTG Patient will maintain dynamic standing balance (PT) Description: LTG:  Patient will maintain dynamic standing balance with assistance during mobility activities (PT) Outcome: Completed/Met   Problem: Sit to Stand Goal: LTG:  Patient will perform sit to stand with assistance level (PT) Description: LTG:  Patient will perform sit to stand with assistance level (PT) Outcome: Completed/Met   Problem: RH Bed Mobility Goal: LTG Patient will perform bed mobility with assist (PT) Description: LTG: Patient will perform bed mobility with assistance, with/without cues (PT). Outcome: Completed/Met   Problem: RH Bed to Chair Transfers Goal: LTG Patient will perform bed/chair transfers w/assist (PT) Description: LTG: Patient will perform bed to chair transfers with assistance (PT). Outcome: Completed/Met   Problem: RH Car Transfers Goal: LTG Patient will perform car transfers with assist (PT) Description: LTG: Patient will perform car transfers with assistance (PT). Outcome: Completed/Met   Problem: RH Ambulation Goal: LTG Patient will ambulate in controlled environment (PT) Description: LTG: Patient will ambulate in a controlled environment, # of feet with assistance (PT). Outcome: Completed/Met Goal: LTG Patient will ambulate in home environment (PT) Description: LTG: Patient will ambulate in home environment, # of feet with assistance (PT). Outcome: Completed/Met   Problem: RH Wheelchair Mobility Goal: LTG Patient will propel w/c in controlled environment (PT) Description: LTG: Patient will propel wheelchair in controlled environment, # of feet with assist (PT) Outcome: Completed/Met Goal: LTG Patient will propel w/c in home environment (PT) Description: LTG: Patient will propel wheelchair in home environment, # of feet with assistance (PT). Outcome: Completed/Met

## 2023-06-18 NOTE — Plan of Care (Signed)

## 2023-06-19 ENCOUNTER — Encounter (HOSPITAL_COMMUNITY): Payer: Self-pay | Admitting: Surgery

## 2023-06-19 DIAGNOSIS — T8149XA Infection following a procedure, other surgical site, initial encounter: Secondary | ICD-10-CM | POA: Diagnosis not present

## 2023-06-19 LAB — COMPREHENSIVE METABOLIC PANEL WITH GFR
ALT: 35 U/L (ref 0–44)
AST: 53 U/L — ABNORMAL HIGH (ref 15–41)
Albumin: 1.9 g/dL — ABNORMAL LOW (ref 3.5–5.0)
Alkaline Phosphatase: 63 U/L (ref 38–126)
Anion gap: 10 (ref 5–15)
BUN: 14 mg/dL (ref 8–23)
CO2: 25 mmol/L (ref 22–32)
Calcium: 8.6 mg/dL — ABNORMAL LOW (ref 8.9–10.3)
Chloride: 94 mmol/L — ABNORMAL LOW (ref 98–111)
Creatinine, Ser: 0.59 mg/dL (ref 0.44–1.00)
GFR, Estimated: >60 mL/min (ref >60)
Glucose, Bld: 106 mg/dL — ABNORMAL HIGH (ref 70–99)
Potassium: 3.4 mmol/L — ABNORMAL LOW (ref 3.5–5.1)
Sodium: 129 mmol/L — ABNORMAL LOW (ref 135–145)
Total Bilirubin: 0.6 mg/dL (ref 0.0–1.2)
Total Protein: 6.6 g/dL (ref 6.5–8.1)

## 2023-06-19 LAB — CK: Total CK: 1132 U/L — ABNORMAL HIGH (ref 38–234)

## 2023-06-19 LAB — CBC
HCT: 24.5 % — ABNORMAL LOW (ref 36.0–46.0)
Hemoglobin: 7.8 g/dL — ABNORMAL LOW (ref 12.0–15.0)
MCH: 30.2 pg (ref 26.0–34.0)
MCHC: 31.8 g/dL (ref 30.0–36.0)
MCV: 95 fL (ref 80.0–100.0)
Platelets: 284 10*3/uL (ref 150–400)
RBC: 2.58 MIL/uL — ABNORMAL LOW (ref 3.87–5.11)
RDW: 15.6 % — ABNORMAL HIGH (ref 11.5–15.5)
WBC: 10.8 10*3/uL — ABNORMAL HIGH (ref 4.0–10.5)
nRBC: 0 % (ref 0.0–0.2)

## 2023-06-19 NOTE — Plan of Care (Signed)
 Patient was discharged to acute care for I and D of the right groin.

## 2023-06-19 NOTE — Plan of Care (Signed)
  Problem: Clinical Measurements: Goal: Ability to maintain clinical measurements within normal limits will improve Outcome: Progressing Goal: Diagnostic test results will improve Outcome: Progressing Goal: Respiratory complications will improve Outcome: Progressing Goal: Cardiovascular complication will be avoided Outcome: Progressing   Problem: Nutrition: Goal: Adequate nutrition will be maintained Outcome: Progressing   Problem: Coping: Goal: Level of anxiety will decrease Outcome: Progressing   Problem: Elimination: Goal: Will not experience complications related to bowel motility Outcome: Progressing Goal: Will not experience complications related to urinary retention Outcome: Progressing   Problem: Pain Managment: Goal: General experience of comfort will improve and/or be controlled Outcome: Progressing   Problem: Safety: Goal: Ability to remain free from injury will improve Outcome: Progressing

## 2023-06-19 NOTE — Plan of Care (Signed)

## 2023-06-19 NOTE — Plan of Care (Signed)
  Problem: RH Balance Goal: LTG Patient will maintain dynamic standing with ADLs (OT) Description: LTG:  Patient will maintain dynamic standing balance with assist during activities of daily living (OT)  Outcome: Completed/Met   Problem: RH Bathing Goal: LTG Patient will bathe all body parts with assist levels (OT) Description: LTG: Patient will bathe all body parts with assist levels (OT) Outcome: Completed/Met   Problem: RH Dressing Goal: LTG Patient will perform lower body dressing w/assist (OT) Description: LTG: Patient will perform lower body dressing with assist, with/without cues in positioning using equipment (OT) Outcome: Completed/Met   Problem: RH Toileting Goal: LTG Patient will perform toileting task (3/3 steps) with assistance level (OT) Description: LTG: Patient will perform toileting task (3/3 steps) with assistance level (OT)  Outcome: Completed/Met   Problem: RH Simple Meal Prep Goal: LTG Patient will perform simple meal prep w/assist (OT) Description: LTG: Patient will perform simple meal prep with assistance, with/without cues (OT). Outcome: Completed/Met   Problem: RH Light Housekeeping Goal: LTG Patient will perform light housekeeping w/assist (OT) Description: LTG: Patient will perform light housekeeping with assistance, with/without cues (OT). Outcome: Completed/Met   Problem: RH Toilet Transfers Goal: LTG Patient will perform toilet transfers w/assist (OT) Description: LTG: Patient will perform toilet transfers with assist, with/without cues using equipment (OT) Outcome: Completed/Met   Problem: RH Tub/Shower Transfers Goal: LTG Patient will perform tub/shower transfers w/assist (OT) Description: LTG: Patient will perform tub/shower transfers with assist, with/without cues using equipment (OT) Outcome: Completed/Met   

## 2023-06-19 NOTE — Progress Notes (Signed)
   06/19/23 1246  TOC Brief Assessment  Insurance and Status Reviewed  Patient has primary care physician Yes  Home environment has been reviewed home w/ family Radio broadcast assistant)  Prior level of function: w/ assist  Prior/Current Home Services Current home services (has referral to [facility] for HH needs, Pam following for home IV abx needs)  Social Drivers of Health Review SDOH reviewed no interventions necessary  Readmission risk has been reviewed Yes  Transition of care needs transition of care needs identified, TOC will continue to follow    Pt readmitted from [facility]- s/p: right groin irrigation and debridement, placement of kerecis and wound vac to the right groin, and revision of right AKA - noted plans to return to OR this wed 6/18 for further vascular procedures.  CM will follow for transition needs- noted on chart review of d/c planning in CIR- that pt was set up with home VAC (VAC was approved and delivered) [facility] following for [facility] needs, will follow up any home IV abx needs- family was educated by home infusion liaison Pam prior to re-admit.

## 2023-06-19 NOTE — H&P (View-Only) (Signed)
 Progress Note    06/19/2023 8:17 AM * No surgery found *  Subjective: She has felt feverish for the past day and has been sweating on and off   Vitals:   06/19/23 0417 06/19/23 0724  BP: (!) 142/56 (!) 131/47  Pulse: 73 67  Resp: 18   Temp: 98.6 F (37 C) 98.7 F (37.1 C)  SpO2: 100% 100%    Physical Exam: General: Laying in bed, NAD Cardiac: Regular Lungs: Nonlabored Incisions: Right groin wound VAC changed.  Right groin wound partially closed with expressible pus from the proximal wound bed.  Right AKA incision viable and intact  CBC    Component Value Date/Time   WBC 10.8 (H) 06/19/2023 0700   RBC 2.58 (L) 06/19/2023 0700   HGB 7.8 (L) 06/19/2023 0700   HGB 14.2 03/23/2021 0928   HCT 24.5 (L) 06/19/2023 0700   HCT 42.1 03/23/2021 0928   PLT 284 06/19/2023 0700   PLT 135 (L) 03/23/2021 0928   MCV 95.0 06/19/2023 0700   MCV 93 03/23/2021 0928   MCH 30.2 06/19/2023 0700   MCHC 31.8 06/19/2023 0700   RDW 15.6 (H) 06/19/2023 0700   RDW 12.0 03/23/2021 0928   LYMPHSABS 2.2 06/08/2023 0509   LYMPHSABS 1.9 03/23/2021 0928   MONOABS 1.0 06/08/2023 0509   EOSABS 0.1 06/08/2023 0509   EOSABS 0.3 03/23/2021 0928   BASOSABS 0.0 06/08/2023 0509   BASOSABS 0.0 03/23/2021 0928    BMET    Component Value Date/Time   NA 129 (L) 06/19/2023 0423   NA 138 01/15/2020 1128   K 3.4 (L) 06/19/2023 0423   CL 94 (L) 06/19/2023 0423   CO2 25 06/19/2023 0423   GLUCOSE 106 (H) 06/19/2023 0423   BUN 14 06/19/2023 0423   BUN 8 01/15/2020 1128   CREATININE 0.59 06/19/2023 0423   CALCIUM  8.6 (L) 06/19/2023 0423   GFRNONAA >60 06/19/2023 0423   GFRAA 80 01/15/2020 1128    INR    Component Value Date/Time   INR 1.5 (H) 05/29/2023 0030     Intake/Output Summary (Last 24 hours) at 06/19/2023 0817 Last data filed at 06/19/2023 0306 Gross per 24 hour  Intake 477 ml  Output 700 ml  Net -223 ml      Assessment/Plan:  68 y.o. female is 3 days postop, s/p: right groin  irrigation and debridement, placement of kerecis and wound vac to the right groin, and revision of right AKA    - She is doing okay this morning.  No documented fevers in the past 24 hours, however she has felt feverish and has had intermittent sweats -Right AKA incision intact and viable -Right groin wound VAC was changed this morning.  Right groin wound is partially closed with pus that can be manually expressed from the proximal portion of the wound bed. -CBC obtained this morning demonstrates leukocytosis  at 10.8k -Currently on IV vancomycin  -Dr. Charlotte Cookey was also at patient's bedside.  We have discussed with the patient and family that it appears her right axillary-femoral bypass remains infected and will require excision.  Dr. Charlotte Cookey has discussed with the family and patient that she will require removal of her right axillary femoral bypass and femorofemoral bypass.  During the same surgery, he will also likely create a left axillary to left femoral bypass to preserve blood flow to the left leg.  Surgery will likely be this Wednesday   Deneise Finlay, [facility] [phone] 06/19/2023 8:17 AM  I  agree with the above.  I have seen and evaluated the patient.  The right groin wound VAC was changed.  She had purulent drainage coming from the incision.  I probed this with a Q-tip.  This is right on top of her residual axillary femoral graft.  The daughter was at the bedside.  I think that we have tried everything to salvage this graft but nothing has been successful and so now it is time to remove the graft.  In order to do so she is going to need some form of revascularization of the left leg, likely a left axillary femoral graft after we remove the right-sided graft.  I discussed this with the daughter.  Will plan on doing this on Wednesday.  Gareld June

## 2023-06-19 NOTE — Progress Notes (Signed)
 PROGRESS NOTE    Katherine Archer  ZOX:[id number] DOB: [birth year] DOA: 06/16/2023 PCP: Barbar Levine, MD   Brief Narrative:  Katherine Archer is a 68 y.o. female with past medical history of hypertension, CVA, hyperlipidemia, prior tobacco abuse(quit smoking 3 years ago), PAD maintained on Eliquis  with multiple revascularizations including recent right femoral-popliteal BGD 05/12/2023 with Dr. Charlotte Cookey who discharged to inpatient rehab.  Wound VAC discontinued 06/15/2023 with concern for ongoing infection at surgical site, vascular surgery requested readmission with medicine team given likely need for irrigation and debridement of right groin wound.  Assessment & Plan:   Principal Problem:   Surgical site infection  Goals of care  - Lengthy discussion with patient and family today, it appears they are looking into transitioning to palliative care and wish to transport their mother back home, they indicate she has a 3-hour flight.  Unclear if patient can fly with a wound VAC or if she will need more secure bandaging prior to discharge if this is their wish. - Appreciate insight recommendations from vascular team -will likely require a multiple-disciplinary discussion to safely discharge her  Rule out right groin infection PAD with recent femoropopliteal surgery 05/12/2023 Nonhealing right AKA Surgical note indicates 'no obvious infection with irrigation, noted poor tissue coverage of the graft -wound VAC replaced -redo AKA' - Mild fever 48h ago x1 - CBC with minimally elevated leukocyte count - Defer to surgical team for further insight recommendations, continues on vancomycin  for coverage - Wound VAC exchange per surgical team - Decrease narcotic burden as tolerated/IV for breakthrough pain only  HTN -labile but improving, hypertensive events likely driven by pain and discomfort HLD -low-fat diet, Tobacco abuse (former)  DVT prophylaxis: heparin  injection 5,000 Units Start: 06/16/23 2200 Code  Status:   Code Status: Limited: Do not attempt resuscitation (DNR) -DNR-LIMITED -Do Not Intubate/DNI  Family Communication: None present  Status is: Inpatient  Dispo: The patient is from: Home              Anticipated d/c is to: To be determined family indicates they want to take the mother home, which requires a 3-hour flight              Anticipated d/c date is: To be determined pending surgical course              Patient currently not medically stable for discharge  Consultants:  Vascular surgery  Procedures:  I&D/washout 06/16/2023 AKA reevaluation 06/16/2023  Antimicrobials:  Vancomycin   Subjective: No acute issues or events overnight, pain currently well-controlled denies nausea vomiting diarrhea constipation headache fevers chills or chest pain  Objective: Vitals:   06/18/23 2359 06/19/23 0306 06/19/23 0417 06/19/23 0724  BP: (!) 143/49  (!) 142/56 (!) 131/47  Pulse: 72  73 67  Resp: 18  18   Temp: 98.5 F (36.9 C)  98.6 F (37 C) 98.7 F (37.1 C)  TempSrc: Oral  Oral Oral  SpO2: 100%  100% 100%  Weight:  63 kg      Intake/Output Summary (Last 24 hours) at 06/19/2023 0751 Last data filed at 06/19/2023 0306 Gross per 24 hour  Intake 477 ml  Output 700 ml  Net -223 ml   Filed Weights   06/18/23 0034 06/18/23 0500 06/19/23 0306  Weight: 63.6 kg 63.6 kg 63 kg    Examination:  General:  Pleasantly resting in bed, No acute distress. HEENT:  Normocephalic atraumatic.  Sclerae nonicteric, noninjected.  Extraocular movements intact bilaterally. Neck:  Without mass or  deformity.  Trachea is midline. Lungs:  Clear to auscultate bilaterally without rhonchi, wheeze, or rales. Heart:  Regular rate and rhythm.  Without murmurs, rubs, or gallops. Abdomen:  Soft, nontender, nondistended.  Without guarding or rebound. Extremities: Right AKA, wound VAC to groin in place without leak Skin:  Warm and dry, no erythema.   Data Reviewed: I have personally reviewed following  labs and imaging studies  CBC: Recent Labs  Lab 06/15/23 1339 06/19/23 0700  WBC 7.6 10.8*  HGB 8.7* 7.8*  HCT 27.5* 24.5*  MCV 96.5 95.0  PLT 367 284   Basic Metabolic Panel: Recent Labs  Lab 06/15/23 1339 06/16/23 1759 06/19/23 0423  NA 134*  --  129*  K 4.1  --  3.4*  CL 97*  --  94*  CO2 26  --  25  GLUCOSE 119*  --  106*  BUN 14  --  14  CREATININE 0.84  --  0.59  CALCIUM  8.8*  --  8.6*  MG  --  2.0  --    GFR: Estimated Creatinine Clearance: 64.8 mL/min (by C-G formula based on SCr of 0.59 mg/dL).  Liver Function Tests: Recent Labs  Lab 06/16/23 1759 06/19/23 0423  AST 34 53*  ALT 30 35  ALKPHOS 70 63  BILITOT 0.8 0.6  PROT 7.7 6.6  ALBUMIN  2.5* 1.9*   Cardiac Enzymes: Recent Labs  Lab 06/19/23 0423  CKTOTAL 1,132*    No results found for this or any previous visit (from the past 240 hours).       Radiology Studies: No results found.      Scheduled Meds:  ascorbic acid   250 mg Oral BID   Chlorhexidine  Gluconate Cloth  6 each Topical Daily   feeding supplement  237 mL Oral BID BM   heparin   5,000 Units Subcutaneous Q12H   oxyCODONE   10 mg Oral QHS   sodium chloride  flush  3 mL Intravenous Q12H   zinc  sulfate (50mg  elemental zinc )  220 mg Oral Daily   Continuous Infusions:  vancomycin  1,250 mg (06/18/23 1025)     LOS: 3 days   Time spent:  Haydee Lipa, DO [facility]  If 7PM-7AM, please contact night-coverage [email or web address]  06/19/2023, 7:51 AM

## 2023-06-19 NOTE — Progress Notes (Addendum)
 Progress Note    06/19/2023 8:17 AM * No surgery found *  Subjective: She has felt feverish for the past day and has been sweating on and off   Vitals:   06/19/23 0417 06/19/23 0724  BP: (!) 142/56 (!) 131/47  Pulse: 73 67  Resp: 18   Temp: 98.6 F (37 C) 98.7 F (37.1 C)  SpO2: 100% 100%    Physical Exam: General: Laying in bed, NAD Cardiac: Regular Lungs: Nonlabored Incisions: Right groin wound VAC changed.  Right groin wound partially closed with expressible pus from the proximal wound bed.  Right AKA incision viable and intact  CBC    Component Value Date/Time   WBC 10.8 (H) 06/19/2023 0700   RBC 2.58 (L) 06/19/2023 0700   HGB 7.8 (L) 06/19/2023 0700   HGB 14.2 03/23/2021 0928   HCT 24.5 (L) 06/19/2023 0700   HCT 42.1 03/23/2021 0928   PLT 284 06/19/2023 0700   PLT 135 (L) 03/23/2021 0928   MCV 95.0 06/19/2023 0700   MCV 93 03/23/2021 0928   MCH 30.2 06/19/2023 0700   MCHC 31.8 06/19/2023 0700   RDW 15.6 (H) 06/19/2023 0700   RDW 12.0 03/23/2021 0928   LYMPHSABS 2.2 06/08/2023 0509   LYMPHSABS 1.9 03/23/2021 0928   MONOABS 1.0 06/08/2023 0509   EOSABS 0.1 06/08/2023 0509   EOSABS 0.3 03/23/2021 0928   BASOSABS 0.0 06/08/2023 0509   BASOSABS 0.0 03/23/2021 0928    BMET    Component Value Date/Time   NA 129 (L) 06/19/2023 0423   NA 138 01/15/2020 1128   K 3.4 (L) 06/19/2023 0423   CL 94 (L) 06/19/2023 0423   CO2 25 06/19/2023 0423   GLUCOSE 106 (H) 06/19/2023 0423   BUN 14 06/19/2023 0423   BUN 8 01/15/2020 1128   CREATININE 0.59 06/19/2023 0423   CALCIUM  8.6 (L) 06/19/2023 0423   GFRNONAA >60 06/19/2023 0423   GFRAA 80 01/15/2020 1128    INR    Component Value Date/Time   INR 1.5 (H) 05/29/2023 0030     Intake/Output Summary (Last 24 hours) at 06/19/2023 0817 Last data filed at 06/19/2023 0306 Gross per 24 hour  Intake 477 ml  Output 700 ml  Net -223 ml      Assessment/Plan:  68 y.o. female is 3 days postop, s/p: right groin  irrigation and debridement, placement of kerecis and wound vac to the right groin, and revision of right AKA    - She is doing okay this morning.  No documented fevers in the past 24 hours, however she has felt feverish and has had intermittent sweats -Right AKA incision intact and viable -Right groin wound VAC was changed this morning.  Right groin wound is partially closed with pus that can be manually expressed from the proximal portion of the wound bed. -CBC obtained this morning demonstrates leukocytosis  at 10.8k -Currently on IV vancomycin  -Dr. Charlotte Cookey was also at patient's bedside.  We have discussed with the patient and family that it appears her right axillary-femoral bypass remains infected and will require excision.  Dr. Charlotte Cookey has discussed with the family and patient that she will require removal of her right axillary femoral bypass and femorofemoral bypass.  During the same surgery, he will also likely create a left axillary to left femoral bypass to preserve blood flow to the left leg.  Surgery will likely be this Wednesday   Katherine Archer, [facility] [phone] 06/19/2023 8:17 AM  I  agree with the above.  I have seen and evaluated the patient.  The right groin wound VAC was changed.  She had purulent drainage coming from the incision.  I probed this with a Q-tip.  This is right on top of her residual axillary femoral graft.  The daughter was at the bedside.  I think that we have tried everything to salvage this graft but nothing has been successful and so now it is time to remove the graft.  In order to do so she is going to need some form of revascularization of the left leg, likely a left axillary femoral graft after we remove the right-sided graft.  I discussed this with the daughter.  Will plan on doing this on Wednesday.  Katherine Archer

## 2023-06-20 DIAGNOSIS — Z515 Encounter for palliative care: Secondary | ICD-10-CM

## 2023-06-20 DIAGNOSIS — Z7189 Other specified counseling: Secondary | ICD-10-CM

## 2023-06-20 LAB — CBC
HCT: 25.2 % — ABNORMAL LOW (ref 36.0–46.0)
Hemoglobin: 8 g/dL — ABNORMAL LOW (ref 12.0–15.0)
MCH: 30.5 pg (ref 26.0–34.0)
MCHC: 31.7 g/dL (ref 30.0–36.0)
MCV: 96.2 fL (ref 80.0–100.0)
Platelets: 320 10*3/uL (ref 150–400)
RBC: 2.62 MIL/uL — ABNORMAL LOW (ref 3.87–5.11)
RDW: 15.4 % (ref 11.5–15.5)
WBC: 9.5 10*3/uL (ref 4.0–10.5)
nRBC: 0 % (ref 0.0–0.2)

## 2023-06-20 LAB — VANCOMYCIN, PEAK: Vancomycin Pk: 31 ug/mL (ref 30–40)

## 2023-06-20 LAB — GLUCOSE, CAPILLARY: Glucose-Capillary: 124 mg/dL — ABNORMAL HIGH (ref 70–99)

## 2023-06-20 NOTE — Progress Notes (Addendum)
 Progress Note    06/20/2023 7:33 AM * No surgery found *  Subjective: her right groin and AKA stump are more tender today   Vitals:   06/19/23 2339 06/20/23 0400  BP: (!) 121/55 (!) 128/48  Pulse: 74 77  Resp: 19 15  Temp: 99.3 F (37.4 C) 97.7 F (36.5 C)  SpO2: 100% 100%    Physical Exam: General:  sitting up in bed, NAD Lungs:  nonlabored Incisions:  right groin wound with wound vac with weak seal. Pus is slowly leaking from the edge of the wound vac dressing. Some new erythema around the medial portion of the right AKA incision  CBC    Component Value Date/Time   WBC 10.8 (H) 06/19/2023 0700   RBC 2.58 (L) 06/19/2023 0700   HGB 7.8 (L) 06/19/2023 0700   HGB 14.2 03/23/2021 0928   HCT 24.5 (L) 06/19/2023 0700   HCT 42.1 03/23/2021 0928   PLT 284 06/19/2023 0700   PLT 135 (L) 03/23/2021 0928   MCV 95.0 06/19/2023 0700   MCV 93 03/23/2021 0928   MCH 30.2 06/19/2023 0700   MCHC 31.8 06/19/2023 0700   RDW 15.6 (H) 06/19/2023 0700   RDW 12.0 03/23/2021 0928   LYMPHSABS 2.2 06/08/2023 0509   LYMPHSABS 1.9 03/23/2021 0928   MONOABS 1.0 06/08/2023 0509   EOSABS 0.1 06/08/2023 0509   EOSABS 0.3 03/23/2021 0928   BASOSABS 0.0 06/08/2023 0509   BASOSABS 0.0 03/23/2021 0928    BMET    Component Value Date/Time   NA 129 (L) 06/19/2023 0423   NA 138 01/15/2020 1128   K 3.4 (L) 06/19/2023 0423   CL 94 (L) 06/19/2023 0423   CO2 25 06/19/2023 0423   GLUCOSE 106 (H) 06/19/2023 0423   BUN 14 06/19/2023 0423   BUN 8 01/15/2020 1128   CREATININE 0.59 06/19/2023 0423   CALCIUM  8.6 (L) 06/19/2023 0423   GFRNONAA >60 06/19/2023 0423   GFRAA 80 01/15/2020 1128    INR    Component Value Date/Time   INR 1.5 (H) 05/29/2023 0030     Intake/Output Summary (Last 24 hours) at 06/20/2023 0733 Last data filed at 06/20/2023 0600 Gross per 24 hour  Intake 974.86 ml  Output 1000 ml  Net -25.14 ml      Assessment/Plan:  68 y.o. female is 4 days post op, s/p: right  groin irrigation and debridement, placement of kerecis and wound vac to the right groin, and revision of right AKA    -She seems somewhat uncomfortable this morning and reports increased tenderness to her right groin and right AKA stump -On IV vancomycin . CBC this morning pending. Tmax yesterday was 99.49F -Right AKA incision is intact with some new erythema to the medial portion of the stump -Right groin with wound vac with weak seal. There appears to be pus slowly draining out of the distal portion of the wound vac bandage. I attempted to patch this area with a tegaderm. Will likely take down the wound vac today and pack the wound with a WTD dressing -Patient and family are considering taking the patient home instead of pursuing further surgery. She is tentatively scheduled for surgery tomorrow. She will be discussing her options today with palliative care   Deneise Finlay, PA-C [facility] [phone] 06/20/2023 7:33 AM  I agree with the above.  I have seen and evaluated the patient.  Family is not at the bedside.  She is complaining of pain in her right groin.  She continues to have purulent drainage.  I see no other option but to remove her graft in the right groin.  In doing so she will be left with an ischemic left leg and so a left axillary femoral graft will need to be placed.  The patient has had discussions with palliative, however wants to proceed with surgery tomorrow.  She will be DNR/DNI.  I will try to reach out to her daughter later this evening.   Gareld June

## 2023-06-20 NOTE — Progress Notes (Signed)
 PROGRESS NOTE    Katherine Archer  ZOX:[id number] DOB: June 30, 1955 DOA: 06/16/2023 PCP: Barbar Levine, MD   Brief Narrative:  Katherine Archer is a 68 y.o. female with past medical history of hypertension, CVA, hyperlipidemia, prior tobacco abuse(quit smoking 3 years ago), PAD maintained on Eliquis  with multiple revascularizations including recent right femoral-popliteal BGD 05/12/2023 with Dr. Charlotte Cookey who discharged to inpatient rehab.  Wound VAC discontinued 06/15/2023 with concern for ongoing infection at surgical site, vascular surgery requested readmission with medicine team given likely need for irrigation and debridement of right groin wound.  Assessment & Plan:   Principal Problem:   Surgical site infection  Rule out right groin infection PAD with recent femoropopliteal surgery 05/12/2023 Nonhealing right AKA Surgical note indicates 'no obvious infection with irrigation, noted poor tissue coverage of the graft -wound VAC replaced -redo AKA' - Mild fever 48h ago x1 - CBC with minimally elevated leukocyte count - Defer to surgical team for further insight recommendations, continues on vancomycin  for coverage - Wound VAC exchange per surgical team - Decrease narcotic burden as tolerated/IV for breakthrough pain only  Goals of care  - Appreciate palliative care assistance with this case - After further discussion with palliative care myself and vascular surgery patient is agreeable to move forward with procedure tomorrow - She remains DNR at this time but otherwise continue aggressive treatment in the interim. - Disposition planning, family hopes to keep patient here for ongoing medical care even if she were to transition to palliative or hospice, they have decided traveling with her while sick would be difficult.  HTN -labile but improving, hypertensive events likely driven by pain and discomfort HLD -low-fat diet, Tobacco abuse (former)  DVT prophylaxis: heparin  injection 5,000 Units  Start: 06/16/23 2200 Code Status:   Code Status: Limited: Do not attempt resuscitation (DNR) -DNR-LIMITED -Do Not Intubate/DNI  Family Communication: None present  Status is: Inpatient  Dispo: The patient is from: Home              Anticipated d/c is to: Pending              Anticipated d/c date is: To be determined pending surgical course              Patient currently not medically stable for discharge  Consultants:  Vascular surgery  Procedures:  I&D/washout 06/16/2023 AKA reevaluation 06/16/2023 Tentative planned left axillary femoral graft 06/21/2023  Antimicrobials:  Vancomycin   Subjective: No acute issues or events overnight, pain currently well-controlled denies nausea vomiting diarrhea constipation headache fevers chills or chest pain  Objective: Vitals:   06/19/23 2038 06/19/23 2339 06/20/23 0400 06/20/23 0700  BP: (!) 138/51 (!) 121/55 (!) 128/48 (!) 130/51  Pulse: 74 74 77   Resp: 12 19 15    Temp: 98.9 F (37.2 C) 99.3 F (37.4 C) 97.7 F (36.5 C) (!) 97.5 F (36.4 C)  TempSrc: Oral Oral Oral Oral  SpO2: 99% 100% 100%   Weight:   69.4 kg     Intake/Output Summary (Last 24 hours) at 06/20/2023 0757 Last data filed at 06/20/2023 0600 Gross per 24 hour  Intake 974.86 ml  Output 1000 ml  Net -25.14 ml   Filed Weights   06/18/23 0500 06/19/23 0306 06/20/23 0400  Weight: 63.6 kg 63 kg 69.4 kg    Examination:  General:  Pleasantly resting in bed, No acute distress. HEENT:  Normocephalic atraumatic.  Sclerae nonicteric, noninjected.  Extraocular movements intact bilaterally. Neck:  Without mass or deformity.  Trachea  is midline. Lungs:  Clear to auscultate bilaterally without rhonchi, wheeze, or rales. Heart:  Regular rate and rhythm.  Without murmurs, rubs, or gallops. Abdomen:  Soft, nontender, nondistended.  Without guarding or rebound. Extremities: Right AKA, wound VAC to groin in place without leak Skin:  Warm and dry, no erythema.   Data Reviewed:  I have personally reviewed following labs and imaging studies  CBC: Recent Labs  Lab 06/15/23 1339 06/19/23 0700  WBC 7.6 10.8*  HGB 8.7* 7.8*  HCT 27.5* 24.5*  MCV 96.5 95.0  PLT 367 284   Basic Metabolic Panel: Recent Labs  Lab 06/15/23 1339 06/16/23 1759 06/19/23 0423  NA 134*  --  129*  K 4.1  --  3.4*  CL 97*  --  94*  CO2 26  --  25  GLUCOSE 119*  --  106*  BUN 14  --  14  CREATININE 0.84  --  0.59  CALCIUM  8.8*  --  8.6*  MG  --  2.0  --    GFR: Estimated Creatinine Clearance: 64.8 mL/min (by C-G formula based on SCr of 0.59 mg/dL).  Liver Function Tests: Recent Labs  Lab 06/16/23 1759 06/19/23 0423  AST 34 53*  ALT 30 35  ALKPHOS 70 63  BILITOT 0.8 0.6  PROT 7.7 6.6  ALBUMIN  2.5* 1.9*   Cardiac Enzymes: Recent Labs  Lab 06/19/23 0423  CKTOTAL 1,132*    No results found for this or any previous visit (from the past 240 hours).       Radiology Studies: No results found.      Scheduled Meds:  ascorbic acid   250 mg Oral BID   Chlorhexidine  Gluconate Cloth  6 each Topical Daily   feeding supplement  237 mL Oral BID BM   heparin   5,000 Units Subcutaneous Q12H   oxyCODONE   10 mg Oral QHS   sodium chloride  flush  3 mL Intravenous Q12H   zinc  sulfate (50mg  elemental zinc )  220 mg Oral Daily   Continuous Infusions:  vancomycin  1,250 mg (06/19/23 0950)     LOS: 4 days   Time spent:  Haydee Lipa, DO [facility]  If 7PM-7AM, please contact night-coverage [email or web address]  06/20/2023, 7:57 AM

## 2023-06-20 NOTE — Consult Note (Signed)
 Consultation Note Date: 06/20/2023   Patient Name: Katherine Archer  DOB: 01-Jun-1955  MRN: [id number]  Age / Sex: 68 y.o., female  PCP: Barbar Levine, MD Referring Physician: Haydee Lipa, MD  Reason for Consultation: Establishing goals of care  HPI/Patient Profile: 69 y.o. female  with past medical history of PAD, critical limb ischemia s/p R femoral-popliteal artery bypass s/p R AKA, HTN, HLD admitted on 06/16/2023 with purulent drainage at right groin and poor healing or R AKA.   Clinical Assessment and Goals of Care: Consult received and chart review completed. Katherine Archer is known to me from previous hospitalization and conversation prior to amputation. I met today with Katherine Archer and daughter, Katherine Archer, at bedside. 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   I had a conversation with Katherine Archer and Katherine Archer about path forward. Katherine Archer has grimacing at times throughout our conversation but overall in good spirits and making jokes. Ultimately they have decided that they wish to move forward with surgical intervention. They feel that there is some hope given from vascular surgery that this can be helpful for her. They are also relying on faith for hope for healing. They report that she did very well with rehab and they have been pleased with progress. They are not ready to stop interventions and hopes for improvement as she is still having some level of quality of life.   We further discussed plans for the time she may decide comfort care. They have had a chance to speak more as a family and all agree that in the future if she elects comfort care they will pursue that care here locally and no plans to go to [place] as she had mentioned previously. They understand that the care and resources from medical care as well  as family support is not as good there.   Goals are clear. All questions/concerns addressed. Emotional support provided.   Primary Decision Maker PATIENT    SUMMARY OF RECOMMENDATIONS   - DNR/DNI decided last hospital stay in my conversations - Plans to continue treatment and surgical intervention as needed - continues to have quality of life  Code Status/Advance Care Planning: DNR   Symptom Management:  Per attending and vascular surgery.   Prognosis:  Unable to determine  Discharge Planning: To Be Determined      Primary Diagnoses: Present on Admission:  Surgical site infection   I have reviewed the medical record, interviewed the patient and family, and examined the patient. The following aspects are pertinent.  Past Medical History:  Diagnosis Date   Cervical polyp 09/18/2018   Critical lower limb ischemia (HCC)    Encounter for screening mammogram for breast cancer 05/05/2016   Fibrocystic breast changes of both breasts 04/29/2015   Hyperlipidemia    Hypertension    PAD (peripheral artery disease) (HCC) 08/23/2019   Stroke (HCC) 08/23/2019   Social History   Socioeconomic History   Marital  status: Divorced    Spouse name: Not on file   Number of children: Not on file   Years of education: Not on file   Highest education level: Not on file  Occupational History   Not on file  Tobacco Use   Smoking status: Former    Current packs/day: 0.00    Types: Cigarettes    Quit date: 10/04/2019    Years since quitting: 3.7    Passive exposure: Never   Smokeless tobacco: Never  Vaping Use   Vaping status: Never Used  Substance and Sexual Activity   Alcohol  use: No   Drug use: No   Sexual activity: Not Currently  Other Topics Concern   Not on file  Social History Narrative   Not on file   Social Drivers of Health   Financial Resource Strain: Not on file  Food Insecurity: No Food Insecurity (06/17/2023)   Hunger Vital Sign    Worried About Running Out of  Food in the Last Year: Never true    Ran Out of Food in the Last Year: Never true  Transportation Needs: No Transportation Needs (06/17/2023)   PRAPARE - Administrator, Civil Service (Medical): No    Lack of Transportation (Non-Medical): No  Physical Activity: Not on file  Stress: Not on file  Social Connections: Socially Isolated (06/17/2023)   Social Connection and Isolation Panel    Frequency of Communication with Friends and Family: More than three times a week    Frequency of Social Gatherings with Friends and Family: More than three times a week    Attends Religious Services: Never    Database administrator or Organizations: No    Attends Banker Meetings: Never    Marital Status: Widowed   Family History  Problem Relation Age of Onset   Heart Problems Father    Heart Problems Brother    Heart Problems Brother    Scheduled Meds:  ascorbic acid   250 mg Oral BID   Chlorhexidine  Gluconate Cloth  6 each Topical Daily   feeding supplement  237 mL Oral BID BM   heparin   5,000 Units Subcutaneous Q12H   oxyCODONE   10 mg Oral QHS   sodium chloride  flush  3 mL Intravenous Q12H   zinc  sulfate (50mg  elemental zinc )  220 mg Oral Daily   Continuous Infusions:  vancomycin  1,250 mg (06/20/23 0950)   PRN Meds:.acetaminophen , HYDROmorphone  (DILAUDID ) injection, oxyCODONE  Allergies  Allergen Reactions   Penicillins Other (See Comments)    Shaky and Dizziness 20 years ago   Review of Systems  Constitutional:  Positive for appetite change.  Skin:  Positive for wound.    Physical Exam Vitals and nursing note reviewed.  Constitutional:      General: She is not in acute distress.    Appearance: She is ill-appearing.   Cardiovascular:     Rate and Rhythm: Normal rate.  Pulmonary:     Effort: No tachypnea, accessory muscle usage or respiratory distress.  Abdominal:     General: Abdomen is flat.   Skin:    Comments: R AKA - wound vac to groin - drainage  below wound vac   Neurological:     Mental Status: She is alert and oriented to person, place, and time.     Vital Signs: BP (!) 144/50 (BP Location: Left Arm)   Pulse 77   Temp (!) 97.3 F (36.3 C) (Axillary)   Resp 15   Wt 69.4 kg  SpO2 100%   BMI 24.69 kg/m  Pain Scale: 0-10 POSS *See Group Information*: 1-Acceptable,Awake and alert Pain Score: Asleep   SpO2: SpO2: 100 % O2 Device:SpO2: 100 % O2 Flow Rate: .   IO: Intake/output summary:  Intake/Output Summary (Last 24 hours) at 06/20/2023 1212 Last data filed at 06/20/2023 0600 Gross per 24 hour  Intake 734.86 ml  Output 1000 ml  Net -265.14 ml    LBM: Last BM Date : 06/16/23 Baseline Weight: Weight: 64.1 kg Most recent weight: Weight: 69.4 kg     Palliative Assessment/Data:     Time Total: 75 min  Greater than 50%  of this time was spent counseling and coordinating care related to the above assessment and plan.  Signed by: Vila Grayer, NP Palliative Medicine Team Pager # [phone] (M-F 8a-5p) Team Phone # [phone] (Nights/Weekends)

## 2023-06-20 NOTE — Plan of Care (Signed)
  Problem: Clinical Measurements: Goal: Ability to maintain clinical measurements within normal limits will improve Outcome: Progressing Goal: Will remain free from infection Outcome: Progressing   Problem: Elimination: Goal: Will not experience complications related to bowel motility Outcome: Progressing Goal: Will not experience complications related to urinary retention Outcome: Progressing   Problem: Pain Managment: Goal: General experience of comfort will improve and/or be controlled Outcome: Progressing   Problem: Health Behavior/Discharge Planning: Goal: Ability to manage health-related needs will improve Outcome: Progressing

## 2023-06-21 ENCOUNTER — Encounter (HOSPITAL_COMMUNITY): Payer: Self-pay | Admitting: Internal Medicine

## 2023-06-21 ENCOUNTER — Inpatient Hospital Stay: Admitting: Internal Medicine

## 2023-06-21 ENCOUNTER — Other Ambulatory Visit (HOSPITAL_COMMUNITY): Payer: Self-pay

## 2023-06-21 ENCOUNTER — Other Ambulatory Visit: Payer: Self-pay

## 2023-06-21 ENCOUNTER — Inpatient Hospital Stay (HOSPITAL_COMMUNITY): Admitting: Certified Registered Nurse Anesthetist

## 2023-06-21 ENCOUNTER — Encounter (HOSPITAL_COMMUNITY)
Admission: EM | Disposition: A | Discharge status: Rehabilitation Facility | Payer: Self-pay | Attending: Family Medicine

## 2023-06-21 DIAGNOSIS — I2510 Atherosclerotic heart disease of native coronary artery without angina pectoris: Secondary | ICD-10-CM

## 2023-06-21 DIAGNOSIS — I10 Essential (primary) hypertension: Secondary | ICD-10-CM

## 2023-06-21 DIAGNOSIS — Z87891 Personal history of nicotine dependence: Secondary | ICD-10-CM

## 2023-06-21 DIAGNOSIS — T8149XA Infection following a procedure, other surgical site, initial encounter: Secondary | ICD-10-CM | POA: Diagnosis not present

## 2023-06-21 DIAGNOSIS — T8149XD Infection following a procedure, other surgical site, subsequent encounter: Secondary | ICD-10-CM | POA: Diagnosis not present

## 2023-06-21 DIAGNOSIS — Z9889 Other specified postprocedural states: Secondary | ICD-10-CM

## 2023-06-21 DIAGNOSIS — T827XXA Infection and inflammatory reaction due to other cardiac and vascular devices, implants and grafts, initial encounter: Secondary | ICD-10-CM

## 2023-06-21 DIAGNOSIS — Z89611 Acquired absence of right leg above knee: Secondary | ICD-10-CM

## 2023-06-21 HISTORY — PX: APPLICATION, SKIN SUBSTITUTE: SHX7530

## 2023-06-21 HISTORY — PX: REMOVAL OF GRAFT: SHX6361

## 2023-06-21 HISTORY — PX: AXILLARY-FEMORAL BYPASS GRAFT: SHX894

## 2023-06-21 HISTORY — PX: INCISION AND DRAINAGE OF WOUND: SHX1803

## 2023-06-21 HISTORY — PX: APPLICATION OF WOUND VAC: SHX5189

## 2023-06-21 LAB — POCT I-STAT 7, (LYTES, BLD GAS, ICA,H+H)
Acid-Base Excess: 0 mmol/L (ref 0.0–2.0)
Bicarbonate: 23.8 mmol/L (ref 20.0–28.0)
Calcium, Ion: 1.14 mmol/L — ABNORMAL LOW (ref 1.15–1.40)
HCT: 19 % — ABNORMAL LOW (ref 36.0–46.0)
Hemoglobin: 6.5 g/dL — CL (ref 12.0–15.0)
O2 Saturation: 100 %
Potassium: 4.1 mmol/L (ref 3.5–5.1)
Sodium: 133 mmol/L — ABNORMAL LOW (ref 135–145)
TCO2: 25 mmol/L (ref 22–32)
pCO2 arterial: 35.3 mmHg (ref 32–48)
pH, Arterial: 7.436 (ref 7.35–7.45)
pO2, Arterial: 243 mmHg — ABNORMAL HIGH (ref 83–108)

## 2023-06-21 LAB — POCT ACTIVATED CLOTTING TIME
Activated Clotting Time: 199 s
Activated Clotting Time: 205 s
Activated Clotting Time: 228 s

## 2023-06-21 LAB — PREPARE RBC (CROSSMATCH)

## 2023-06-21 SURGERY — REMOVAL, GRAFT
Anesthesia: General | Site: Groin | Laterality: Right

## 2023-06-21 MED ORDER — PROTAMINE SULFATE 10 MG/ML IV SOLN
INTRAVENOUS | Status: DC | PRN
Start: 2023-06-21 — End: 2023-06-21
  Administered 2023-06-21: 50 mg via INTRAVENOUS

## 2023-06-21 MED ORDER — MIDAZOLAM HCL 2 MG/2ML IJ SOLN
INTRAMUSCULAR | Status: DC | PRN
  Administered 2023-06-21: 2 mg via INTRAVENOUS

## 2023-06-21 MED ORDER — FENTANYL CITRATE (PF) 250 MCG/5ML IJ SOLN
INTRAMUSCULAR | Status: AC
  Filled 2023-06-21: qty 5

## 2023-06-21 MED ORDER — SODIUM CHLORIDE 0.9% IV SOLUTION: Freq: Once | INTRAVENOUS | Status: DC

## 2023-06-21 MED ORDER — VANCOMYCIN HCL 1000 MG IV SOLR
INTRAVENOUS | Status: AC
  Filled 2023-06-21: qty 20

## 2023-06-21 MED ORDER — MIDAZOLAM HCL 2 MG/2ML IJ SOLN
INTRAMUSCULAR | Status: AC
  Filled 2023-06-21: qty 2

## 2023-06-21 MED ORDER — PHENYLEPHRINE HCL-NACL 20-0.9 MG/250ML-% IV SOLN
INTRAVENOUS | Status: DC | PRN
  Administered 2023-06-21: 50 ug/min via INTRAVENOUS

## 2023-06-21 MED ORDER — HEPARIN SODIUM (PORCINE) 5000 UNIT/ML IJ SOLN
5000.0000 [IU] | Freq: Three times a day (TID) | INTRAMUSCULAR | Status: DC
  Administered 2023-06-22 – 2023-06-28 (×18): 5000 [IU] via SUBCUTANEOUS
  Filled 2023-06-21 (×18): qty 1

## 2023-06-21 MED ORDER — GENTAMICIN SULFATE 40 MG/ML IJ SOLN
INTRAMUSCULAR | Status: AC
  Filled 2023-06-21: qty 6

## 2023-06-21 MED ORDER — CHLORHEXIDINE GLUCONATE 0.12 % MT SOLN
15.0000 mL | Freq: Once | OROMUCOSAL | Status: AC
  Administered 2023-06-21: 15 mL via OROMUCOSAL

## 2023-06-21 MED ORDER — HEPARIN 6000 UNIT IRRIGATION SOLUTION
Status: DC | PRN
Start: 2023-06-21 — End: 2023-06-21
  Administered 2023-06-21: 1

## 2023-06-21 MED ORDER — SURGIFLO WITH THROMBIN (HEMOSTATIC MATRIX KIT) OPTIME
TOPICAL | Status: DC | PRN
  Administered 2023-06-21: 1 via TOPICAL

## 2023-06-21 MED ORDER — HYDROMORPHONE HCL 1 MG/ML IJ SOLN
INTRAMUSCULAR | Status: AC
  Filled 2023-06-21: qty 0.5

## 2023-06-21 MED ORDER — ONDANSETRON HCL 4 MG/2ML IJ SOLN
INTRAMUSCULAR | Status: AC
  Filled 2023-06-21: qty 2

## 2023-06-21 MED ORDER — ORAL CARE MOUTH RINSE: 15.0000 mL | Freq: Once | OROMUCOSAL | Status: AC

## 2023-06-21 MED ORDER — LACTATED RINGERS IV SOLN: INTRAVENOUS | Status: DC

## 2023-06-21 MED ORDER — PHENYLEPHRINE 80 MCG/ML (10ML) SYRINGE FOR IV PUSH (FOR BLOOD PRESSURE SUPPORT)
PREFILLED_SYRINGE | INTRAVENOUS | Status: DC | PRN
  Administered 2023-06-21: 80 ug via INTRAVENOUS

## 2023-06-21 MED ORDER — HEMOSTATIC AGENTS (NO CHARGE) OPTIME
TOPICAL | Status: DC | PRN
  Administered 2023-06-21: 1 via TOPICAL

## 2023-06-21 MED ORDER — SODIUM CHLORIDE 0.9 % IR SOLN
Status: DC | PRN
  Administered 2023-06-21: 3000 mL

## 2023-06-21 MED ORDER — HYDROMORPHONE HCL 1 MG/ML IJ SOLN
INTRAMUSCULAR | Status: DC | PRN
  Administered 2023-06-21 (×3): .5 mg via INTRAVENOUS

## 2023-06-21 MED ORDER — ONDANSETRON HCL 4 MG/2ML IJ SOLN
INTRAMUSCULAR | Status: DC | PRN
  Administered 2023-06-21: 4 mg via INTRAVENOUS

## 2023-06-21 MED ORDER — LIDOCAINE 2% (20 MG/ML) 5 ML SYRINGE
INTRAMUSCULAR | Status: DC | PRN
  Administered 2023-06-21: 40 mg via INTRAVENOUS

## 2023-06-21 MED ORDER — GABAPENTIN 300 MG PO CAPS
300.0000 mg | ORAL_CAPSULE | Freq: Two times a day (BID) | ORAL | Status: DC
  Administered 2023-06-21 – 2023-07-04 (×25): 300 mg via ORAL
  Filled 2023-06-21 (×25): qty 1

## 2023-06-21 MED ORDER — PROPOFOL 10 MG/ML IV BOLUS
INTRAVENOUS | Status: AC
  Filled 2023-06-21: qty 20

## 2023-06-21 MED ORDER — HEPARIN SODIUM (PORCINE) 1000 UNIT/ML IJ SOLN
INTRAMUSCULAR | Status: DC | PRN
  Administered 2023-06-21: 6000 [IU] via INTRAVENOUS
  Administered 2023-06-21: 2000 [IU] via INTRAVENOUS
  Administered 2023-06-21 (×2): 1000 [IU] via INTRAVENOUS

## 2023-06-21 MED ORDER — ROCURONIUM BROMIDE 10 MG/ML (PF) SYRINGE
PREFILLED_SYRINGE | INTRAVENOUS | Status: AC
  Filled 2023-06-21: qty 10

## 2023-06-21 MED ORDER — HYDRALAZINE HCL 25 MG PO TABS
25.0000 mg | ORAL_TABLET | Freq: Three times a day (TID) | ORAL | Status: DC
  Administered 2023-06-21 – 2023-07-04 (×35): 25 mg via ORAL
  Filled 2023-06-21 (×38): qty 1

## 2023-06-21 MED ORDER — DEXAMETHASONE SODIUM PHOSPHATE 10 MG/ML IJ SOLN
INTRAMUSCULAR | Status: AC
  Filled 2023-06-21: qty 1

## 2023-06-21 MED ORDER — SUGAMMADEX SODIUM 200 MG/2ML IV SOLN
INTRAVENOUS | Status: DC | PRN
  Administered 2023-06-21: 150 mg via INTRAVENOUS

## 2023-06-21 MED ORDER — ROCURONIUM BROMIDE 10 MG/ML (PF) SYRINGE
PREFILLED_SYRINGE | INTRAVENOUS | Status: DC | PRN
  Administered 2023-06-21: 10 mg via INTRAVENOUS
  Administered 2023-06-21: 50 mg via INTRAVENOUS
  Administered 2023-06-21 (×2): 20 mg via INTRAVENOUS

## 2023-06-21 MED ORDER — PROPOFOL 10 MG/ML IV BOLUS
INTRAVENOUS | Status: DC | PRN
  Administered 2023-06-21: 100 mg via INTRAVENOUS

## 2023-06-21 MED ORDER — SODIUM CHLORIDE 0.9 % IV SOLN: INTRAVENOUS | Status: DC | PRN

## 2023-06-21 MED ORDER — LIDOCAINE 2% (20 MG/ML) 5 ML SYRINGE
INTRAMUSCULAR | Status: AC
  Filled 2023-06-21: qty 5

## 2023-06-21 MED ORDER — 0.9 % SODIUM CHLORIDE (POUR BTL) OPTIME
TOPICAL | Status: DC | PRN
  Administered 2023-06-21: 1000 mL

## 2023-06-21 MED ORDER — FENTANYL CITRATE (PF) 250 MCG/5ML IJ SOLN
INTRAMUSCULAR | Status: DC | PRN
  Administered 2023-06-21: 100 ug via INTRAVENOUS
  Administered 2023-06-21 (×3): 50 ug via INTRAVENOUS

## 2023-06-21 MED ORDER — HEPARIN 6000 UNIT IRRIGATION SOLUTION
Status: AC
  Filled 2023-06-21: qty 500

## 2023-06-21 MED ORDER — ALBUMIN HUMAN 5 % IV SOLN: INTRAVENOUS | Status: DC | PRN

## 2023-06-21 MED ORDER — AMLODIPINE BESYLATE 10 MG PO TABS
10.0000 mg | ORAL_TABLET | Freq: Every day | ORAL | Status: DC
  Administered 2023-06-21 – 2023-07-04 (×13): 10 mg via ORAL
  Filled 2023-06-21 (×13): qty 1

## 2023-06-21 MED ORDER — PROTAMINE SULFATE 10 MG/ML IV SOLN
INTRAVENOUS | Status: AC
  Filled 2023-06-21: qty 5

## 2023-06-21 MED ORDER — DEXAMETHASONE SODIUM PHOSPHATE 10 MG/ML IJ SOLN
INTRAMUSCULAR | Status: DC | PRN
  Administered 2023-06-21: 10 mg via INTRAVENOUS

## 2023-06-21 MED ORDER — SODIUM CHLORIDE 0.9 % IV SOLN
INTRAVENOUS | Status: DC | PRN
Start: 2023-06-21 — End: 2023-06-21

## 2023-06-21 SURGICAL SUPPLY — 45 items
BAG COUNTER SPONGE SURGICOUNT (BAG) ×3 IMPLANT
BNDG ELASTIC 6X10 VLCR STRL LF (GAUZE/BANDAGES/DRESSINGS) IMPLANT
CANISTER SUCTION 3000ML PPV (SUCTIONS) ×3 IMPLANT
CANISTER WOUNDNEG PRESSURE 500 (CANNISTER) IMPLANT
CLIP TI MEDIUM 24 (CLIP) ×3 IMPLANT
CLIP TI WIDE RED SMALL 24 (CLIP) ×3 IMPLANT
CONNECTOR Y WND VAC (MISCELLANEOUS) IMPLANT
COVER SURGICAL LIGHT HANDLE (MISCELLANEOUS) IMPLANT
DERMABOND ADVANCED .7 DNX12 (GAUZE/BANDAGES/DRESSINGS) ×3 IMPLANT
DRAPE HALF SHEET 40X57 (DRAPES) IMPLANT
DRESSING VERAFLO CLEANSE CC (GAUZE/BANDAGES/DRESSINGS) IMPLANT
DRSG COVADERM 4X6 (GAUZE/BANDAGES/DRESSINGS) IMPLANT
DRSG TEGADERM 4X4.75 (GAUZE/BANDAGES/DRESSINGS) IMPLANT
ELECTRODE BLDE 4.0 EZ CLN MEGD (MISCELLANEOUS) IMPLANT
ELECTRODE REM PT RTRN 9FT ADLT (ELECTROSURGICAL) ×3 IMPLANT
GAUZE SPONGE 4X4 12PLY STRL (GAUZE/BANDAGES/DRESSINGS) IMPLANT
GLOVE SURG SS PI 7.5 STRL IVOR (GLOVE) ×9 IMPLANT
GOWN STRL REUS W/ TWL LRG LVL3 (GOWN DISPOSABLE) ×6 IMPLANT
GOWN STRL REUS W/ TWL XL LVL3 (GOWN DISPOSABLE) ×3 IMPLANT
GRAFT PROPATEN W/RING 6X80X60 (Vascular Products) IMPLANT
GRAFT SKIN WND MICRO 38 (Tissue) IMPLANT
HEMOSTAT SNOW SURGICEL 2X4 (HEMOSTASIS) IMPLANT
HEMOSTAT SURGICEL NU-KNIT 6X9 (HEMOSTASIS) IMPLANT
INSERT FOGARTY SM (MISCELLANEOUS) IMPLANT
KIT BASIN OR (CUSTOM PROCEDURE TRAY) ×3 IMPLANT
KIT TURNOVER KIT B (KITS) ×3 IMPLANT
NS IRRIG 1000ML POUR BTL (IV SOLUTION) ×6 IMPLANT
PACK PERIPHERAL VASCULAR (CUSTOM PROCEDURE TRAY) ×3 IMPLANT
PAD ARMBOARD POSITIONER FOAM (MISCELLANEOUS) ×6 IMPLANT
PAD NEG PRESSURE SENSATRAC (MISCELLANEOUS) IMPLANT
SET HNDPC FAN SPRY TIP SCT (DISPOSABLE) IMPLANT
SPONGE T-LAP 18X18 ~~LOC~~+RFID (SPONGE) IMPLANT
STAPLER SKIN PROX 35W (STAPLE) IMPLANT
SURGIFLO W/THROMBIN 8M KIT (HEMOSTASIS) IMPLANT
SUT PROLENE 5 0 C 1 24 (SUTURE) ×3 IMPLANT
SUT PROLENE 6 0 BV (SUTURE) ×3 IMPLANT
SUT PROLENE 6 0 C 1 30 (SUTURE) IMPLANT
SUT SILK 2 0 SH (SUTURE) ×3 IMPLANT
SUT SILK 3-0 18XBRD TIE 12 (SUTURE) IMPLANT
SUT VIC AB 2-0 CT1 TAPERPNT 27 (SUTURE) ×6 IMPLANT
SUT VIC AB 3-0 SH 27X BRD (SUTURE) ×6 IMPLANT
SUT VIC AB 4-0 PS2 18 (SUTURE) ×6 IMPLANT
TOWEL GREEN STERILE (TOWEL DISPOSABLE) ×3 IMPLANT
UNDERPAD 30X36 HEAVY ABSORB (UNDERPADS AND DIAPERS) ×3 IMPLANT
WATER STERILE IRR 1000ML POUR (IV SOLUTION) ×3 IMPLANT

## 2023-06-21 NOTE — Progress Notes (Signed)
 [facility] ROUNDING NOTE Katherine Archer OVF:[id number]  DOB: 1955-09-03  DOA: 06/16/2023  PCP: Katherine Levine, MD  06/21/2023,3:51 PM  LOS: 5 days    Code Status: Full code   from: CIR current Dispo: Unclear if   68 year old female with known HTN HLD PAD on Eliquis  Prior smoker until 2022 Right femoropopliteal EGD 05/12/2023 discharged 05/15/2023--complicated by wound dehiscence sepsis 5/25-underwent reexposure R CFA ligation right accessory lobe bifemoral bypass graft excision of right CFA excision right femoral to above knee popliteal bypass etc. 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  Patient was admitted to rehab on 6/4 and in the process of rehab at [facility] on routine follow-up there had increased drainage and fibrinous exudate and it was felt that I&D of groin and redo right AKA site would be needed Patient was discharged back to acute care  6/13 right groin infection had an I&D and placement of 38 centimeter squared Kerecis graft with wound VAC and redo right above-knee amputation 6/18 removal of right axillofemoral and femorofemoral bypass    Plan  Nonhealing AKA, infected groin Status post removal of bypass 6/18 Continues on vancomycin  monotherapy, as wound culture from 5/26 showed MRSA Repeat blood culture 5/27 however no growth Duration antibiotics, wound VAC and further procedure to vascular Pain control currently Oxy IR 5 every 6 as needed severe pain, continue chronic OxyContin  10 at bedtime For breakthrough pain Dilaudid  1 mg every 3 as needed May use gabapentin  300 twice daily Aspirin  to be resumed according to vascular  HTN HLD HCTZ 12.5 Avapro  75 amlodipine  10 on hold As blood pressure is elevated resume amlodipine  10 and hydralazine  25 every 8 Resuming statin etc. when more stable  Goals of care DNR at this time but hoping to continue aggressive treatment to salvage limb  DVT prophylaxis: Heparin   Status is: Inpatient Remains inpatient appropriate because:   Awaiting  stability      Subjective: She had a little bit of visual disturbance but that is improved after reorienting her and giving her eyedrops She has some moderate pain Daughter is at the bedside who serves as Nurse, learning disability (patient's daughter as a Nurse, learning disability here at [employer]) no fever   Objective + exam Vitals:   06/21/23 1445 06/21/23 1500 06/21/23 1515 06/21/23 1536  BP: (!) 141/54 132/82  (!) 139/52  Pulse: 72 65 66 70  Resp: 16 16 16 13   Temp: 98 F (36.7 C)  98 F (36.7 C)   TempSrc:      SpO2: 100% 99% 100% 100%  Weight:      Height:       Filed Weights   06/20/23 0400 06/21/23 0337 06/21/23 0756  Weight: 69.4 kg 62.1 kg (P) 62.1 kg    Examination: Coherent pleasant but frail female no distress .  Changes to left pectoral area with bandage over right Foley catheter in place Wound VAC in place over right AKA site not disturbed Abdomen soft  Data Reviewed: reviewed   CBC    Component Value Date/Time   WBC 9.5 06/20/2023 0830   RBC 2.62 (L) 06/20/2023 0830   HGB 8.0 (L) 06/20/2023 0830   HGB 14.2 03/23/2021 0928   HCT 25.2 (L) 06/20/2023 0830   HCT 42.1 03/23/2021 0928   PLT 320 06/20/2023 0830   PLT 135 (L) 03/23/2021 0928   MCV 96.2 06/20/2023 0830   MCV 93 03/23/2021 0928   MCH 30.5 06/20/2023 0830   MCHC 31.7 06/20/2023 0830   RDW 15.4 06/20/2023 0830  RDW 12.0 03/23/2021 0928   LYMPHSABS 2.2 06/08/2023 0509   LYMPHSABS 1.9 03/23/2021 0928   MONOABS 1.0 06/08/2023 0509   EOSABS 0.1 06/08/2023 0509   EOSABS 0.3 03/23/2021 0928   BASOSABS 0.0 06/08/2023 0509   BASOSABS 0.0 03/23/2021 0928      Latest Ref Rng & Units 06/19/2023    4:23 AM 06/16/2023    5:59 PM 06/15/2023    1:39 PM  CMP  Glucose 70 - 99 mg/dL 865   784   BUN 8 - 23 mg/dL 14   14   Creatinine 6.96 - 1.00 mg/dL 2.95   2.84   Sodium 132 - 145 mmol/L 129   134   Potassium 3.5 - 5.1 mmol/L 3.4   4.1   Chloride 98 - 111 mmol/L 94   97   CO2 22 - 32 mmol/L 25   26   Calcium  8.9 - 10.3  mg/dL 8.6   8.8   Total Protein 6.5 - 8.1 g/dL 6.6  7.7    Total Bilirubin 0.0 - 1.2 mg/dL 0.6  0.8    Alkaline Phos 38 - 126 U/L 63  70    AST 15 - 41 U/L 53  34    ALT 0 - 44 U/L 35  30      Scheduled Meds:  ascorbic acid   250 mg Oral BID   Chlorhexidine  Gluconate Cloth  6 each Topical Daily   feeding supplement  237 mL Oral BID BM   [START ON 06/22/2023] heparin   5,000 Units Subcutaneous Q8H   oxyCODONE   10 mg Oral QHS   sodium chloride  flush  3 mL Intravenous Q12H   zinc  sulfate (50mg  elemental zinc )  220 mg Oral Daily   Continuous Infusions:  vancomycin  1,250 mg (06/20/23 0950)    Time 42  Katherine Glisson, MD  [facility]

## 2023-06-21 NOTE — Anesthesia Procedure Notes (Signed)
 Procedure Name: Intubation Date/Time: 06/21/2023 8:54 AM  Performed by: Johann Muta, CRNAPre-anesthesia Checklist: Patient identified, Emergency Drugs available, Suction available and Patient being monitored Patient Re-evaluated:Patient Re-evaluated prior to induction Oxygen Delivery Method: Circle System Utilized Preoxygenation: Pre-oxygenation with 100% oxygen Induction Type: IV induction Ventilation: Mask ventilation without difficulty Laryngoscope Size: Mac and 3 Grade View: Grade I Tube type: Oral Number of attempts: 1 Airway Equipment and Method: Stylet and Oral airway Placement Confirmation: ETT inserted through vocal cords under direct vision, positive ETCO2 and breath sounds checked- equal and bilateral Secured at: 21 cm Tube secured with: Tape Dental Injury: Teeth and Oropharynx as per pre-operative assessment

## 2023-06-21 NOTE — Anesthesia Procedure Notes (Signed)
 Arterial Line Insertion Start/End6/18/2025 9:08 AM, 06/21/2023 9:10 AM Performed by: Micheal Agent, DO, anesthesiologist  Patient location: OR. Preanesthetic checklist: patient identified, IV checked, site marked, risks and benefits discussed, surgical consent, monitors and equipment checked, pre-op evaluation, timeout performed and anesthesia consent Lidocaine  1% used for infiltration and patient sedated Right, radial was placed Catheter size: 20 G Hand hygiene performed  and maximum sterile barriers used   Attempts: 1 Procedure performed without using ultrasound guided technique. Following insertion, dressing applied and Biopatch. Post procedure assessment: normal and unchanged  Post procedure complications: second provider assisted and unsuccessful attempts. Patient tolerated the procedure well with no immediate complications.

## 2023-06-21 NOTE — Anesthesia Preprocedure Evaluation (Addendum)
 Anesthesia Evaluation  Patient identified by MRN, date of birth, ID band Patient awake    Reviewed: Allergy & Precautions, NPO status , Patient's Chart, lab work & pertinent test results  Airway Mallampati: II  TM Distance: >3 FB Neck ROM: Full    Dental  (+) Edentulous Upper, Edentulous Lower   Pulmonary former smoker   Pulmonary exam normal        Cardiovascular hypertension, Pt. on medications + CAD and + Peripheral Vascular Disease   Rhythm:Regular Rate:Normal     Neuro/Psych CVA  negative psych ROS   GI/Hepatic negative GI ROS, Neg liver ROS,,,  Endo/Other  negative endocrine ROS    Renal/GU negative Renal ROS  negative genitourinary   Musculoskeletal negative musculoskeletal ROS (+)    Abdominal Normal abdominal exam  (+)   Peds  Hematology  (+) Blood dyscrasia, anemia Lab Results      Component                Value               Date                      WBC                      9.5                 06/20/2023                HGB                      8.0 (L)             06/20/2023                HCT                      25.2 (L)            06/20/2023                MCV                      96.2                06/20/2023                PLT                      320                 06/20/2023              Anesthesia Other Findings   Reproductive/Obstetrics                             Anesthesia Physical Anesthesia Plan  ASA: 3  Anesthesia Plan: General   Post-op Pain Management:    Induction: Intravenous  PONV Risk Score and Plan: 3 and Ondansetron , Dexamethasone , Treatment may vary due to age or medical condition and Midazolam   Airway Management Planned: Mask and Oral ETT  Additional Equipment: None  Intra-op Plan:   Post-operative Plan: Extubation in OR  Informed Consent: I have reviewed the patients History and Physical, chart, labs and discussed the procedure  including the risks, benefits and alternatives for the proposed anesthesia with  the patient or authorized representative who has indicated his/her understanding and acceptance.     Dental advisory given  Plan Discussed with: CRNA  Anesthesia Plan Comments:        Anesthesia Quick Evaluation

## 2023-06-21 NOTE — Op Note (Signed)
 Patient name: Katherine Archer MRN: [id number] DOB: 08-31-1955 Sex: female  06/21/2023 Pre-operative Diagnosis: Infected bypass graft Post-operative diagnosis:  Same Surgeon:  Gareld June Assistants:  Margaret Sharp, PA Procedure:   #1: Left axillary to left common femoral artery bypass graft with 6 mm external ring PTFE   #2: Redo exposure left common femoral artery   #3: Redo exposure of right axillary artery   #4: Removal of right axillary femoral graft   #5: Removal of right to left femoral-femoral bypass graft   #6: I&D of right groin incision   #7: Placement of first 38 cm skin substitute (Kerecis   #8: I&D of right above-knee amputation   #9: Wound VAC to right groin (13 x 3 x 2 cm) Anesthesia:  General Blood Loss:  200 cc Specimens:  none  Findings: A small piece of dacryon was left on the axillary artery on the right.  There was no evidence of infection of the bypass graft in the left groin or in the left axilla.  Indications: This is a 68 year old female who presented initially with a infected right femoral below-knee popliteal artery bypass graft.  She has a history of a right axillary bifemoral graft.  The femoral-popliteal bypass graft was removed and the axillary and femoral-femoral graft were left.  She ended up getting an ischemic leg requiring amputation.  She has gone back multiple times for washout of her groin.  She has failed all possible attempts at salvaging her axillary femoral graft and now comes in today for removal.  Procedure:  The patient was identified in the holding area and taken to [facility] OR ROOM 11  The patient was then placed supine on the table. general anesthesia was administered.  The patient was prepped and draped in the usual sterile fashion.  A time out was called and antibiotics were administered.  An experienced assistant was necessary to expedite the procedure given its complexity.  She helped with exposure by providing suction and retraction.  She help with  the anastomoses by following the suture.  She helped with wound closure.  I initially began in the left groin.  The previous longitudinal groin incision was opened with a 10 blade.  Cautery and sharp dissection were used to dissect out the femoral-femoral graft.  The superficial femoral artery was exposed as were 2 profunda branches in the common femoral artery.  There was no evidence of infection in the left groin.  Next attention was turned towards the left axilla.  A infraclavicular incision was made 1 fingerbreadth below the clavicle.  Cautery was used to divide subcutaneous tissue down to the pectoral fascia which was opened with cautery.  I then developed an avascular plane between the pectoralis muscle fibers.  Through this window I exposed the axillary artery.  The brachial plexus was noted to be directly anterior to the axillary artery as well as the axillary vein.  These had to be gently removed.  I did divide a portion of the pectoralis minor muscle.  Next a long Gore tunneler was used to create a tunnel between the 2 incisions and a 6 mm external ring PTFE graft was brought through the tunnel.  The patient was fully heparinized.  Heparin  levels were redosed appropriately.  The axillary artery was then occluded with vascular clamps.  A #11 blade was used to make an arteriotomy which was extended longitudinally with Potts scissors.  The graft was beveled to fit the size the arteriotomy and a running anastomosis  was created with 6-0 Prolene.  Prior to completion the appropriate flushing maneuvers were performed.  Once the clamps were removed there was excellent flow through the graft.  Next, the femoral-femoral graft was occluded as were the profunda vessels.  The femoral-femoral graft was then transected.  I ended up removing the entire dacryon graft in the right groin.  The superficial femoral and proximal external iliac arteries were occluded.  I then spatulated the Gore-Tex graft to fit the size the  arteriotomy and a running anastomosis was created with 6-0 Prolene.  Prior to completion the appropriate flushing maneuvers were performed and the anastomosis was completed.  The patient had excellent profundofemoral Doppler signals.  The heparin  was then reversed with 50 mg of protamine .  Hemostasis was achieved.  The axillary incision was closed by reapproximating the fascia with 3-0 Vicryl the subtenons tissue with 3-0 Vicryl and skin with 4-0 Vicryl.  The groin incision was closed by reapproximating the subcutaneous tissue over top of the bypass with 2-0 Vicryl.  An additional subcutaneous layer was closed followed by subcuticular closure.  Dermabond was applied to these incisions.  These were then covered with Ioban.  A suprapubic incision was then made and the femoral-femoral graft was dissected out and brought from the left groin into the suprapubic incision.  I then opened the previous right axillary incision.  Cautery was used to divide subcutaneous tissue down to the fascia which was opened.  I then identified the graft.  There was no evidence of infection at this level.  There was a fair amount of scar tissue and so I elected to leave a cuff of the graft on the axillary artery.  The graft was then transected and the proximal end oversewn with 2 layers of 5-0 Prolene.  Next a made a counterincision over top of the graft in the chest and dissected out the graft between the axillary incision and this incision and brought the graft out.  I then irrigated the axillary incision and closed it with 2 layers of Vicryl and subcuticular closure.  Dermabond was applied.  This incision was then covered with a Tegaderm.  Finally, the right groin incision was opened by removing the sutures.  The remaining graft was dissected free and then the entire graft was fully removed.  It was encased with purulent material.  I then irrigated the groin with 3 L of saline.  Hemostasis was achieved.  I placed Kerecis powder  throughout the wound followed by a blue sponge wound VAC.  I then examined the above-knee amputation.  The medial aspect had purulent appearing fluid draining from it and so I removed multiple staples along the medial aspect.  The fascia sutures were not intact and there was a cavity going down to the bone.  I elected to pack this with gauze after irrigating it.  Sterile dressings were applied.  The patient was successfully extubated and taken recovery in stable condition.   Disposition: To PACU stable   V. Gareld June, M.D., [facility] [place] Office: [phone] Pager:  [phone]

## 2023-06-21 NOTE — Transfer of Care (Signed)
 Immediate Anesthesia Transfer of Care Note  Patient: Katherine Archer  Procedure(s) Performed: REMOVAL, GRAFT, RIGHT AXILLARY BIFEMORAL GRAFT (Right) CREATION, BYPASS, ARTERIAL, LEFT AXILLARY TO LEFT FEMORAL, USING GRAFT (Left: Groin) APPLICATION, SKIN SUBSTITUTE (Right: Groin) APPLICATION, WOUND VAC (Right: Groin) IRRIGATION AND DEBRIDEMENT OF RIGHT GROIN AND RIGHT ABOVE KNEE AMPUTATION (Right: Groin)  Patient Location: PACU  Anesthesia Type:General  Level of Consciousness: awake and drowsy  Airway & Oxygen Therapy: Patient Spontanous Breathing and Patient connected to face mask oxygen  Post-op Assessment: Report given to RN and Post -op Vital signs reviewed and stable  Post vital signs: Reviewed and stable  Last Vitals:  Vitals Value Taken Time  BP 145/58 06/21/23 13:45  Temp    Pulse 68 06/21/23 13:46  Resp 18 06/21/23 13:46  SpO2 100 % 06/21/23 13:46  Vitals shown include unfiled device data.  Last Pain:  Vitals:   06/21/23 0759  TempSrc:   PainSc: 5       Patients Stated Pain Goal: 0 (06/18/23 1601)  Complications: No notable events documented.

## 2023-06-21 NOTE — Progress Notes (Signed)
  Progress Note    06/21/2023 7:15 AM * Day of Surgery *  Subjective:  uncomfortable, right groin is hurting her. Still agreeable to surgery    Vitals:   06/21/23 0337 06/21/23 0455  BP: (!) 146/52   Pulse: 65 70  Resp: 20 17  Temp: 98.6 F (37 C)   SpO2: 100% 100%    Physical Exam: General:  sitting up in bed, NAD Lungs:  nonlabored Incisions:  right groin with wound vac with some continued slow drainage  Extremities:  right AKA intact   CBC    Component Value Date/Time   WBC 9.5 06/20/2023 0830   RBC 2.62 (L) 06/20/2023 0830   HGB 8.0 (L) 06/20/2023 0830   HGB 14.2 03/23/2021 0928   HCT 25.2 (L) 06/20/2023 0830   HCT 42.1 03/23/2021 0928   PLT 320 06/20/2023 0830   PLT 135 (L) 03/23/2021 0928   MCV 96.2 06/20/2023 0830   MCV 93 03/23/2021 0928   MCH 30.5 06/20/2023 0830   MCHC 31.7 06/20/2023 0830   RDW 15.4 06/20/2023 0830   RDW 12.0 03/23/2021 0928   LYMPHSABS 2.2 06/08/2023 0509   LYMPHSABS 1.9 03/23/2021 0928   MONOABS 1.0 06/08/2023 0509   EOSABS 0.1 06/08/2023 0509   EOSABS 0.3 03/23/2021 0928   BASOSABS 0.0 06/08/2023 0509   BASOSABS 0.0 03/23/2021 0928    BMET    Component Value Date/Time   NA 129 (L) 06/19/2023 0423   NA 138 01/15/2020 1128   K 3.4 (L) 06/19/2023 0423   CL 94 (L) 06/19/2023 0423   CO2 25 06/19/2023 0423   GLUCOSE 106 (H) 06/19/2023 0423   BUN 14 06/19/2023 0423   BUN 8 01/15/2020 1128   CREATININE 0.59 06/19/2023 0423   CALCIUM  8.6 (L) 06/19/2023 0423   GFRNONAA >60 06/19/2023 0423   GFRAA 80 01/15/2020 1128    INR    Component Value Date/Time   INR 1.5 (H) 05/29/2023 0030     Intake/Output Summary (Last 24 hours) at 06/21/2023 0715 Last data filed at 06/21/2023 0345 Gross per 24 hour  Intake --  Output 1300 ml  Net -1300 ml      Assessment/Plan:  68 y.o. female going to OR today   -She is doing okay this morning. She is having worsening pain in the right groin -Right AKA intact with continued  erythema medially -She has been NPO past midnight. She is scheduled for removal of right axillary-femoral and fem-fem bypass today for infection. During surgery she will also get a new left ax-fem bypass -She remains agreeable for surgery  Deneise Finlay, PA-C [facility] [phone] 06/21/2023 7:15 AM

## 2023-06-21 NOTE — Interval H&P Note (Signed)
 History and Physical Interval Note:  06/21/2023 7:54 AM  Katherine Archer  has presented today for surgery, with the diagnosis of infection of surgical site.  The various methods of treatment have been discussed with the patient and family. After consideration of risks, benefits and other options for treatment, the patient has consented to  Procedure(s): REMOVAL, GRAFT (Right) as a surgical intervention.  The patient's history has been reviewed, patient examined, no change in status, stable for surgery.  I have reviewed the patient's chart and labs.  Questions were answered to the patient's satisfaction.     Gareld June

## 2023-06-22 ENCOUNTER — Encounter (HOSPITAL_COMMUNITY): Payer: Self-pay | Admitting: Surgery

## 2023-06-22 DIAGNOSIS — T8149XA Infection following a procedure, other surgical site, initial encounter: Secondary | ICD-10-CM | POA: Diagnosis not present

## 2023-06-22 LAB — TYPE AND SCREEN
ABO/RH(D): O POS
Antibody Screen: NEGATIVE
Unit division: 0
Unit division: 0

## 2023-06-22 LAB — CBC WITH DIFFERENTIAL/PLATELET
Abs Immature Granulocytes: 0.21 10*3/uL — ABNORMAL HIGH (ref 0.00–0.07)
Basophils Absolute: 0 10*3/uL (ref 0.0–0.1)
Basophils Relative: 0 %
Eosinophils Absolute: 0 10*3/uL (ref 0.0–0.5)
Eosinophils Relative: 0 %
HCT: 28.5 % — ABNORMAL LOW (ref 36.0–46.0)
Hemoglobin: 9.6 g/dL — ABNORMAL LOW (ref 12.0–15.0)
Immature Granulocytes: 2 %
Lymphocytes Relative: 15 %
Lymphs Abs: 2.1 10*3/uL (ref 0.7–4.0)
MCH: 30.3 pg (ref 26.0–34.0)
MCHC: 33.7 g/dL (ref 30.0–36.0)
MCV: 89.9 fL (ref 80.0–100.0)
Monocytes Absolute: 1.5 10*3/uL — ABNORMAL HIGH (ref 0.1–1.0)
Monocytes Relative: 11 %
Neutro Abs: 10.1 10*3/uL — ABNORMAL HIGH (ref 1.7–7.7)
Neutrophils Relative %: 72 %
Platelets: 311 10*3/uL (ref 150–400)
RBC: 3.17 MIL/uL — ABNORMAL LOW (ref 3.87–5.11)
RDW: 16 % — ABNORMAL HIGH (ref 11.5–15.5)
WBC: 13.9 10*3/uL — ABNORMAL HIGH (ref 4.0–10.5)
nRBC: 0 % (ref 0.0–0.2)

## 2023-06-22 LAB — BPAM RBC
Blood Product Expiration Date: 202507112359
Blood Product Expiration Date: 202507112359
ISSUE DATE / TIME: 202506181249
ISSUE DATE / TIME: 202506181249
Unit Type and Rh: 5100
Unit Type and Rh: 5100

## 2023-06-22 LAB — BASIC METABOLIC PANEL WITH GFR
Anion gap: 10 (ref 5–15)
BUN: 6 mg/dL — ABNORMAL LOW (ref 8–23)
CO2: 24 mmol/L (ref 22–32)
Calcium: 8.8 mg/dL — ABNORMAL LOW (ref 8.9–10.3)
Chloride: 101 mmol/L (ref 98–111)
Creatinine, Ser: 0.39 mg/dL — ABNORMAL LOW (ref 0.44–1.00)
GFR, Estimated: >60 mL/min (ref >60)
Glucose, Bld: 109 mg/dL — ABNORMAL HIGH (ref 70–99)
Potassium: 3.7 mmol/L (ref 3.5–5.1)
Sodium: 135 mmol/L (ref 135–145)

## 2023-06-22 MED ORDER — ONDANSETRON HCL 4 MG/2ML IJ SOLN: 4.0000 mg | Freq: Four times a day (QID) | INTRAMUSCULAR | Status: DC | PRN

## 2023-06-22 NOTE — Anesthesia Postprocedure Evaluation (Signed)
 Anesthesia Post Note  Patient: Katherine Archer  Procedure(s) Performed: REMOVAL, GRAFT, RIGHT AXILLARY BIFEMORAL GRAFT (Right) CREATION, BYPASS, ARTERIAL, LEFT AXILLARY TO LEFT FEMORAL, USING GRAFT (Left: Groin) APPLICATION, SKIN SUBSTITUTE (Right: Groin) APPLICATION, WOUND VAC (Right: Groin) IRRIGATION AND DEBRIDEMENT OF RIGHT GROIN AND RIGHT ABOVE KNEE AMPUTATION (Right: Groin)     Patient location during evaluation: PACU Anesthesia Type: General Level of consciousness: awake and alert Pain management: pain level controlled Vital Signs Assessment: post-procedure vital signs reviewed and stable Respiratory status: spontaneous breathing, nonlabored ventilation, respiratory function stable and patient connected to nasal cannula oxygen Cardiovascular status: blood pressure returned to baseline and stable Postop Assessment: no apparent nausea or vomiting Anesthetic complications: no   No notable events documented.  Last Vitals:  Vitals:   06/22/23 1621 06/22/23 1926  BP: (!) 128/45 (!) 137/50  Pulse: 92 71  Resp: (!) 21 20  Temp: 36.6 C 36.8 C  SpO2: 100% 100%    Last Pain:  Vitals:   06/22/23 1926  TempSrc: Oral  PainSc:                  [name] [name]

## 2023-06-22 NOTE — Progress Notes (Addendum)
 Progress Note    06/22/2023 8:06 AM 1 Day Post-Op  Subjective:  she says her right groin feels better. Denies any pain in the left foot   Vitals:   06/22/23 0224 06/22/23 0756  BP: (!) 128/44 (!) 134/52  Pulse: 69 63  Resp: 20 (!) 23  Temp: 98.1 F (36.7 C) 98.5 F (36.9 C)  SpO2: 100% 100%    Physical Exam: General:  resting comfortably Lungs:  nonlabored Incisions:  bilateral axillary incisions intact and dry without hematoma. Right sided abdominal incisions bandaged and dry. L groin incision intact without drainage or hematoma Extremities:  right groin with wound vac with good seal. R AKA bandaged and dry. Medial portion of right AKA packed with gauze dressing. Brisk left DP/PT doppler signals  CBC    Component Value Date/Time   WBC 13.9 (H) 06/22/2023 0615   RBC 3.17 (L) 06/22/2023 0615   HGB 9.6 (L) 06/22/2023 0615   HGB 14.2 03/23/2021 0928   HCT 28.5 (L) 06/22/2023 0615   HCT 42.1 03/23/2021 0928   PLT 311 06/22/2023 0615   PLT 135 (L) 03/23/2021 0928   MCV 89.9 06/22/2023 0615   MCV 93 03/23/2021 0928   MCH 30.3 06/22/2023 0615   MCHC 33.7 06/22/2023 0615   RDW 16.0 (H) 06/22/2023 0615   RDW 12.0 03/23/2021 0928   LYMPHSABS 2.1 06/22/2023 0615   LYMPHSABS 1.9 03/23/2021 0928   MONOABS 1.5 (H) 06/22/2023 0615   EOSABS 0.0 06/22/2023 0615   EOSABS 0.3 03/23/2021 0928   BASOSABS 0.0 06/22/2023 0615   BASOSABS 0.0 03/23/2021 0928    BMET    Component Value Date/Time   NA 135 06/22/2023 0615   NA 138 01/15/2020 1128   K 3.7 06/22/2023 0615   CL 101 06/22/2023 0615   CO2 24 06/22/2023 0615   GLUCOSE 109 (H) 06/22/2023 0615   BUN 6 (L) 06/22/2023 0615   BUN 8 01/15/2020 1128   CREATININE 0.39 (L) 06/22/2023 0615   CALCIUM  8.8 (L) 06/22/2023 0615   GFRNONAA >60 06/22/2023 0615   GFRAA 80 01/15/2020 1128    INR    Component Value Date/Time   INR 1.5 (H) 05/29/2023 0030     Intake/Output Summary (Last 24 hours) at 06/22/2023 0806 Last data  filed at 06/22/2023 0756 Gross per 24 hour  Intake 3036 ml  Output 2765 ml  Net 271 ml      Assessment/Plan:  68 y.o. female is 1 day post op, s/p: #1: Left axillary to left common femoral artery bypass graft with 6 mm external ring PTFE #2: Redo exposure left common femoral artery #3: Redo exposure of right axillary artery #4: Removal of right axillary femoral graft #5: Removal of right to left femoral-femoral bypass graft #6: I&D of right groin incision #7: Placement of first 38 cm skin substitute (Kerecis #8: I&D of right above-knee amputation f#9: Wound VAC to right groin   -She seems to be feeling better this morning and reports significantly less pain in her right groin -Bilateral axillary and left groin incisions are intact and dry without hematoma. Right sided abdominal incisions intact, bandaged, and dry -Right AKA bandaged and dry. The medial portion of the stump is packed with a dry gauze dressing -Right groin with wound vac with good seal, serosanguineous output in canister -Hgb improved today at 9.6 today after transfusion yesterday -LLE well perfused with brisk DP/PT doppler signals -WBC count elevated at 13.9 this morning, likely reactive due to surgery yesterday. No  fevers yesterday. -Recommend continued discussions with palliative care   Deneise Finlay, PA-C [facility] [phone] 06/22/2023 8:06 AM  I agree with the above.  I seen and evaluated the patient.  The biggest concern is her right leg amputation.  I had to debride this in the operating room and concerned about its ability to heal.  This was discussed with the daughter yesterday.  Our only option would be a hip disarticulation.  We will see what ongoing discussions with palliative care reveal.  Gareld June

## 2023-06-22 NOTE — Progress Notes (Signed)
 Palliative:  HPI: 69 y.o. female  with past medical history of PAD, critical limb ischemia s/p R femoral-popliteal artery bypass s/p R AKA, HTN, HLD admitted on 06/16/2023 with purulent drainage at right groin and poor healing or R AKA.   Noted Dr. Mick Alamin recommendation that hip disarticulation may be needed. I met today with Katherine Archer but no family at bedside. She appears to be in pain but denies need for pain medication. She does report nausea so I ordered Zofran  and asked RN to administer. [name] wishes to try and rest. She agrees that I can call and speak with daughter, Katherine Archer, to discuss.   I spoke with daughter, Katherine Archer. Katherine Archer plans to be present at bedside this evening. Katherine Archer has spoken with Dr. Charlotte Cookey and understands recommendation for hip disarticulation. Katherine Archer has not spoken with her mother about this yet. She wanted to wait until this evening when her mother had some time to rest and get anesthesia out of her system to have a more appropriate conversation. Katherine Archer plans to be here in the morning ~7-10 am and again in the afternoon. I will meet with them tomorrow morning to follow up on their conversation and wishes moving forward. Katherine Archer is very supportive to her mother's wishes and they have ongoing open conversations as a family.   All questions/concerns addressed. Emotional support provided. Discussed with Dr. Samtani.   Exam: Awake, alert. Appears uncomfortable. Breathing regular, unlabored. Abd soft. R AKA. Wounds dressed.   Plan:  - DNR/DNI - Family discussions regarding desire to pursue hip disarticulation - I will follow up with them in the morning  25 min  Katherine Grayer, NP Palliative Medicine Team Pager [phone] (Please see [email or web address] for schedule) Team Phone [phone]

## 2023-06-22 NOTE — Progress Notes (Addendum)
 [facility] ROUNDING NOTE Katherine Archer YNW:[id number]  DOB: 01/05/1955  DOA: 06/16/2023  PCP: Barbar Levine, MD  06/22/2023,3:19 PM  LOS: 6 days    Code Status: Full code   from: CIR current Dispo: Unclear if   68 year old female with known HTN HLD PAD on Eliquis  Prior smoker until 2022 Right femoropopliteal EGD 05/12/2023 discharged 05/15/2023--complicated by wound dehiscence sepsis 5/25-underwent reexposure R CFA ligation right accessory lobe bifemoral bypass graft excision of right CFA excision right femoral to above knee popliteal bypass etc. 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  Patient was admitted to rehab on 6/4 and in the process of rehab at [facility] on routine follow-up there had increased drainage and fibrinous exudate and it was felt that I&D of groin and redo right AKA site would be needed Patient was discharged back to acute care  6/13 right groin infection had an I&D and placement of 38 centimeter squared Kerecis graft with wound VAC and redo right above-knee amputation 6/18 removal of right axillofemoral and femorofemoral bypass    Plan  Nonhealing AKA, infected groin--last blood culture 5/27 was negative Status post removal of bypass 6/18 Continues on vancomycin  monotherapy, as wound culture from 5/26 showed MRSA--Duration antibiotics, wound VAC and further procedure to vascular--- mild leukocytosis either.  Versus related to underlying infection Some concern for poor healing and need for disarticulation of hip as wound seem to be breaking down-palliative engaged for discussions regarding the same Pain control c Oxy IR 5 every 6 as needed severe pain, continue chronic OxyContin  10 at bedtime---For breakthrough pain Dilaudid  1 mg every 3 as needed Scheduled gabapentin  300 twice daily Aspirin  to resume per discretion of vascular  HTN HLD HCTZ 12.5 Avapro  75  Continue amlodipine  10 and hydralazine  25 every 8 Resuming statin etc. when more stable  Acute anemia of blood  loss Looks like was transfused 1 unit perioperatively 6/18 Hemoglobin stable Trend labs  Goals of care DNR at this time-further discussions ongoing with palliative medicine and vascular  DVT prophylaxis: Heparin   Status is: Inpatient Remains inpatient appropriate because:   Awaiting stability      Subjective:  Sleeping when I went into the room-aroused--pain 6/10-has not been able to sit up because of postop changes to belly with sutures etc. Food remains untouched   Objective + exam Vitals:   06/21/23 2323 06/22/23 0224 06/22/23 0756 06/22/23 1202  BP: (!) 150/44 (!) 128/44 (!) 134/52 (!) 119/42  Pulse: 81 69 63 67  Resp: 20 20 (!) 23 (!) 24  Temp: 98.4 F (36.9 C) 98.1 F (36.7 C) 98.5 F (36.9 C) 98.5 F (36.9 C)  TempSrc: Oral Oral Oral Oral  SpO2: 100% 100% 100% 100%  Weight:      Height:       Filed Weights   06/20/23 0400 06/21/23 0337 06/21/23 0756  Weight: 69.4 kg 62.1 kg (P) 62.1 kg    Examination:  EOMI NCAT no focal deficit no icterus no pallor Postop changes to chest on both sides S1-S2 no murmur seems to be sinus Abdomen soft staples in lower quadrant wound VAC in place Slightly dehiscing AKA on lateral aspect of right leg Coherent  Data Reviewed: reviewed   CBC    Component Value Date/Time   WBC 13.9 (H) 06/22/2023 0615   RBC 3.17 (L) 06/22/2023 0615   HGB 9.6 (L) 06/22/2023 0615   HGB 14.2 03/23/2021 0928   HCT 28.5 (L) 06/22/2023 0615   HCT 42.1 03/23/2021 0928   PLT  311 06/22/2023 0615   PLT 135 (L) 03/23/2021 0928   MCV 89.9 06/22/2023 0615   MCV 93 03/23/2021 0928   MCH 30.3 06/22/2023 0615   MCHC 33.7 06/22/2023 0615   RDW 16.0 (H) 06/22/2023 0615   RDW 12.0 03/23/2021 0928   LYMPHSABS 2.1 06/22/2023 0615   LYMPHSABS 1.9 03/23/2021 0928   MONOABS 1.5 (H) 06/22/2023 0615   EOSABS 0.0 06/22/2023 0615   EOSABS 0.3 03/23/2021 0928   BASOSABS 0.0 06/22/2023 0615   BASOSABS 0.0 03/23/2021 0928      Latest Ref Rng & Units  06/22/2023    6:15 AM 06/21/2023   12:43 PM 06/19/2023    4:23 AM  CMP  Glucose 70 - 99 mg/dL 409   811   BUN 8 - 23 mg/dL 6   14   Creatinine 9.14 - 1.00 mg/dL 7.82   9.56   Sodium 213 - 145 mmol/L 135  133  129   Potassium 3.5 - 5.1 mmol/L 3.7  4.1  3.4   Chloride 98 - 111 mmol/L 101   94   CO2 22 - 32 mmol/L 24   25   Calcium  8.9 - 10.3 mg/dL 8.8   8.6   Total Protein 6.5 - 8.1 g/dL   6.6   Total Bilirubin 0.0 - 1.2 mg/dL   0.6   Alkaline Phos 38 - 126 U/L   63   AST 15 - 41 U/L   53   ALT 0 - 44 U/L   35     Scheduled Meds:  amLODipine   10 mg Oral Daily   ascorbic acid   250 mg Oral BID   Chlorhexidine  Gluconate Cloth  6 each Topical Daily   feeding supplement  237 mL Oral BID BM   gabapentin   300 mg Oral BID   heparin   5,000 Units Subcutaneous Q8H   hydrALAZINE   25 mg Oral Q8H   oxyCODONE   10 mg Oral QHS   sodium chloride  flush  3 mL Intravenous Q12H   zinc  sulfate (50mg  elemental zinc )  220 mg Oral Daily   Continuous Infusions:  vancomycin  1,250 mg (06/22/23 1108)    Time 42  Verlie Glisson, MD  [facility]

## 2023-06-23 LAB — BASIC METABOLIC PANEL WITH GFR
Anion gap: 7 (ref 5–15)
BUN: 8 mg/dL (ref 8–23)
CO2: 26 mmol/L (ref 22–32)
Calcium: 8.7 mg/dL — ABNORMAL LOW (ref 8.9–10.3)
Chloride: 99 mmol/L (ref 98–111)
Creatinine, Ser: 0.4 mg/dL — ABNORMAL LOW (ref 0.44–1.00)
GFR, Estimated: >60 mL/min (ref >60)
Glucose, Bld: 97 mg/dL (ref 70–99)
Potassium: 3.8 mmol/L (ref 3.5–5.1)
Sodium: 132 mmol/L — ABNORMAL LOW (ref 135–145)

## 2023-06-23 LAB — CBC WITH DIFFERENTIAL/PLATELET
Abs Immature Granulocytes: 0.48 10*3/uL — ABNORMAL HIGH (ref 0.00–0.07)
Basophils Absolute: 0 10*3/uL (ref 0.0–0.1)
Basophils Relative: 0 %
Eosinophils Absolute: 0.2 10*3/uL (ref 0.0–0.5)
Eosinophils Relative: 2 %
HCT: 28.8 % — ABNORMAL LOW (ref 36.0–46.0)
Hemoglobin: 9.3 g/dL — ABNORMAL LOW (ref 12.0–15.0)
Immature Granulocytes: 4 %
Lymphocytes Relative: 16 %
Lymphs Abs: 2 10*3/uL (ref 0.7–4.0)
MCH: 29.6 pg (ref 26.0–34.0)
MCHC: 32.3 g/dL (ref 30.0–36.0)
MCV: 91.7 fL (ref 80.0–100.0)
Monocytes Absolute: 1.4 10*3/uL — ABNORMAL HIGH (ref 0.1–1.0)
Monocytes Relative: 11 %
Neutro Abs: 8.1 10*3/uL — ABNORMAL HIGH (ref 1.7–7.7)
Neutrophils Relative %: 67 %
Platelets: 319 10*3/uL (ref 150–400)
RBC: 3.14 MIL/uL — ABNORMAL LOW (ref 3.87–5.11)
RDW: 15.9 % — ABNORMAL HIGH (ref 11.5–15.5)
WBC: 12.1 10*3/uL — ABNORMAL HIGH (ref 4.0–10.5)
nRBC: 0 % (ref 0.0–0.2)

## 2023-06-23 LAB — VANCOMYCIN, PEAK: Vancomycin Pk: 34 ug/mL (ref 30–40)

## 2023-06-23 MED ORDER — DOCUSATE SODIUM 100 MG PO CAPS
100.0000 mg | ORAL_CAPSULE | Freq: Two times a day (BID) | ORAL | Status: DC | PRN
  Administered 2023-06-23 – 2023-06-25 (×2): 100 mg via ORAL
  Filled 2023-06-23 (×3): qty 1

## 2023-06-23 MED ORDER — LINEZOLID 600 MG PO TABS
600.0000 mg | ORAL_TABLET | Freq: Two times a day (BID) | ORAL | Status: DC
  Filled 2023-06-23: qty 1

## 2023-06-23 MED ORDER — ADULT MULTIVITAMIN W/MINERALS CH
1.0000 | ORAL_TABLET | Freq: Every day | ORAL | Status: DC
  Administered 2023-06-23 – 2023-07-04 (×11): 1 via ORAL
  Filled 2023-06-23 (×11): qty 1

## 2023-06-23 MED ORDER — VANCOMYCIN HCL 1250 MG/250ML IV SOLN
1250.0000 mg | INTRAVENOUS | Status: DC
  Administered 2023-06-24: 1250 mg via INTRAVENOUS
  Filled 2023-06-23: qty 250

## 2023-06-23 MED ORDER — POLYETHYLENE GLYCOL 3350 17 G PO PACK
17.0000 g | PACK | Freq: Every day | ORAL | Status: DC
  Administered 2023-06-23 – 2023-07-04 (×9): 17 g via ORAL
  Filled 2023-06-23 (×10): qty 1

## 2023-06-23 MED ORDER — DOCUSATE SODIUM 50 MG PO CAPS
50.0000 mg | ORAL_CAPSULE | Freq: Every day | ORAL | Status: DC
  Administered 2023-06-23 – 2023-06-24 (×2): 50 mg via ORAL
  Filled 2023-06-23 (×3): qty 1

## 2023-06-23 NOTE — Progress Notes (Signed)
 Nutrition Follow-up  DOCUMENTATION CODES:   Not applicable  INTERVENTION:  Continue Ensure Plus High Protein po BID, each supplement provides 350 kcal and 20 grams of protein Continue Magic cup TID with meals, each supplement provides 290 kcal and 9 grams of protein Continue [place] Yogurt TID with meals Contiue Vitamin C  250 mg BID for wound healing Multivitamin w/ minerals daily  NUTRITION DIAGNOSIS:  Increased nutrient needs related to wound healing as evidenced by estimated needs. - Ongoing  GOAL:  Patient will meet greater than or equal to 90% of their needs - Ongoing  MONITOR:  PO intake, Supplement acceptance, Labs, Weight trends, Skin  REASON FOR ASSESSMENT:  Consult Wound healing  ASSESSMENT:  68 year old female who was admitted to acute care from CIR on 06/16/23 due to R groin surgical site infection and planned I&D. 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  6/13 - s/p I&D of right groin, placement of skin substitute, placement of wound VAC to right groin, redo R AKA; full liquid diet  6/14 - diet advanced to Heart Healthy 6/18 - Op, s/p  Left axillary to left common femoral artery bypass graft with 6 mm external ring PTFE, Redo exposure left common femoral artery & right axillary artery, Removal of right axillary femoral graft & right to left femoral-femoral bypass graft, I&D of right groin incision & R above-knee amputation, Placement of first 38 cm skin substitute (Kerecis), and Wound VAC to right groin (13 x 3 x 2 cm)  Pt laying in bed, no family at bedside. Reports she is eating ok, drinking the supplements. Denies any nausea or vomiting. Pt requesting pain medications; RD let RN know of pt request. RD encouraged pt to continue with adequate PO intake to help with wound healing.  Ongoing discussions regarding next steps. Vascular team recommends R hip disarticulation. Palliative care consulted to help with  goals of care.   Meal Intake 6/16: 50-100% x 2 meals 6/19: 50% x 3 meals  Admission Weight: 64.1 kg Current Weight: 63.7 kg (6/20)  Nutrition Related Medications: Vitamin C , Colace, Zinc  Sulfate, IV antibiotics  Labs: Sodium 132, Potassium 3.8, BUN 8, Creatinine 0.40    UOP: 2200 mL x 24 hrs Wound VAC output: 25 mL x 24 hrs   NUTRITION - FOCUSED PHYSICAL EXAM: Deferred to follow-up due to   Diet Order:   Diet Order             Diet regular Fluid consistency: Thin  Diet effective now                   EDUCATION NEEDS:   Not appropriate for education at this time  Skin:  Skin Assessment: Skin Integrity Issues: Skin Integrity Issues:: Wound VAC, Other (Comment) Wound Vac: R groin Other: R AKA s/p redo  Last BM:  6/19  Height:  Ht Readings from Last 1 Encounters:  06/21/23 (P) 5' 6 (1.676 m)   Weight:  Wt Readings from Last 1 Encounters:  06/23/23 63.7 kg   Ideal Body Weight:  59.1 kg  BMI:  Body mass index is 22.67 kg/m (pended).  Estimated Nutritional Needs:  Kcal:  1800-2000 Protein:  85-100 grams Fluid:  >1.8 L   Doneta Furbish RD, LDN Clinical Dietitian

## 2023-06-23 NOTE — Progress Notes (Signed)
 Palliative:  HPI: 68 y.o. female  with past medical history of PAD, critical limb ischemia s/p R femoral-popliteal artery bypass s/p R AKA, HTN, HLD admitted on 06/16/2023 with purulent drainage at right groin and poor healing or R AKA.   I met today with Ms. Parrow and daughter, [name], at bedside. [name] is [employer] Spanish interpretor and Ms. Rinehimer prefers to have [name] translate (noting she understands English well but more difficult to speak [place]). She is feeling much better today as compared to although still with pain. Ms. Kapfer and [name] have had conversations yesterday evening as well as this morning. They spoke with [facility]. Ultimately Ms. Wente has decided that she would like to proceed with hip disarticulation. She is not ready to stop. She would like to proceed with hopes of having some healing and ultimate return home and have time with her family. She is also hoping that she can have improved healing that will ultimately decrease her pain burden.   I discussed with [name] that I believe that Ms. Goehring will share with us  when she reaches a point she does not want to keep pushing forward. [name] agrees that she believes her mother will share her wishes openly and honestly. They will await further instruction from surgical team for hip disarticulation timing. [name] expresses concern for increased fluid intake - we discussed with Ms. Douglas to encourage fluids - when watching television take a few sips with each commercial. I provided her with Ensure. We discussed importance of fluid and nutrition/protein for wound healing.   All questions/concerns addressed. Emotional support provided. Updated Dr. Haywood Lisle and [facility].   Exam: Awake, alert. Still with discomfort but improved from yesterday. Breathing regular, unlabored. Abd soft. R AKA. Wounds dressed.    Plan:  - DNR/DNI - Agrees to pursue hip disarticulation  35 min  Vila Grayer, NP Palliative Medicine Team Pager [phone] (Please see [email or web address] for  schedule) Team Phone [phone]

## 2023-06-23 NOTE — Progress Notes (Addendum)
 Progress Note    06/23/2023 7:56 AM 2 Days Post-Op  Subjective:  Looking and feeling better this morning. She is having left groin incisional pain and pain in right AKA stump   Vitals:   06/22/23 2327 06/23/23 0349  BP: (!) 146/43 (!) 134/51  Pulse: 78 73  Resp: (!) 21 17  Temp: 98.9 F (37.2 C) 99.2 F (37.3 C)  SpO2: 100% 100%   Physical Exam: Cardiac:  regular Lungs:  non labored Incisions: Incisions are all well appearing without hematoma Right AKA medial stump dressings with serous drainage Right groin wound vac with good seal, SS output in canister LLE remains well perfused and warm Extremities:  LLE well perfused and warm  Abdomen:  soft Neurologic: alert and oriented  CBC    Component Value Date/Time   WBC 12.1 (H) 06/23/2023 0448   RBC 3.14 (L) 06/23/2023 0448   HGB 9.3 (L) 06/23/2023 0448   HGB 14.2 03/23/2021 0928   HCT 28.8 (L) 06/23/2023 0448   HCT 42.1 03/23/2021 0928   PLT 319 06/23/2023 0448   PLT 135 (L) 03/23/2021 0928   MCV 91.7 06/23/2023 0448   MCV 93 03/23/2021 0928   MCH 29.6 06/23/2023 0448   MCHC 32.3 06/23/2023 0448   RDW 15.9 (H) 06/23/2023 0448   RDW 12.0 03/23/2021 0928   LYMPHSABS 2.0 06/23/2023 0448   LYMPHSABS 1.9 03/23/2021 0928   MONOABS 1.4 (H) 06/23/2023 0448   EOSABS 0.2 06/23/2023 0448   EOSABS 0.3 03/23/2021 0928   BASOSABS 0.0 06/23/2023 0448   BASOSABS 0.0 03/23/2021 0928    BMET    Component Value Date/Time   NA 132 (L) 06/23/2023 0448   NA 138 01/15/2020 1128   K 3.8 06/23/2023 0448   CL 99 06/23/2023 0448   CO2 26 06/23/2023 0448   GLUCOSE 97 06/23/2023 0448   BUN 8 06/23/2023 0448   BUN 8 01/15/2020 1128   CREATININE 0.40 (L) 06/23/2023 0448   CALCIUM  8.7 (L) 06/23/2023 0448   GFRNONAA >60 06/23/2023 0448   GFRAA 80 01/15/2020 1128    INR    Component Value Date/Time   INR 1.5 (H) 05/29/2023 0030     Intake/Output Summary (Last 24 hours) at 06/23/2023 0756 Last data filed at 06/23/2023  0500 Gross per 24 hour  Intake 970 ml  Output 1375 ml  Net -405 ml     Assessment/Plan:  68 y.o. female is s/p: #1: Left axillary to left common femoral artery bypass graft with 6 mm external ring PTFE #2: Redo exposure left common femoral artery #3: Redo exposure of right axillary artery #4: Removal of right axillary femoral graft #5: Removal of right to left femoral-femoral bypass graft #6: I&D of right groin incision #7: Placement of first 38 cm skin substitute (Kerecis #8: I&D of right above-knee amputation f#9: Wound VAC to right groin  Looking and feeling better this morning. She is having left groin incisional pain and pain in right AKA stump Incisions are all well appearing without hematoma Right AKA medial stump dressings with serous drainage Right groin wound vac with good seal, SS output in canister LLE remains well perfused and warm H&H stable WBC trending down. Remains on Vanc Continued discussions with patient regarding proceeding with hip disarticulation Appreciate palliative care and TR[facility] assistance Patient and daughter are aware of current situation and patient expresses some time to think about how she would like to proceed    Katherine Finical, PA-C [facility] [phone]  06/23/2023 7:56 AM  I have independently interviewed and examined patient and agree with PA assessment and plan above.  I have asked Dr. Julio Ohm to evaluate the patient for consideration of right sided hip disarticulation.  Her left sided wounds are healing well and the left foot is well-perfused.  Katherine Archer C. Vikki Graves, MD [facility] [place] Office: [phone] Pager: [phone]

## 2023-06-23 NOTE — Progress Notes (Signed)
 [facility] ROUNDING NOTE Katherine Archer [id number]  DOB: 03/04/1955  DOA: 06/16/2023  PCP: Barbar Levine, MD  06/23/2023,4:08 PM  LOS: 7 days    Code Status: Full code   from: CIR current Dispo: Unclear if   68 year old female with known HTN HLD PAD on Eliquis  Prior smoker until 2022 Right femoropopliteal EGD 05/12/2023 discharged 05/15/2023--complicated by wound dehiscence sepsis 5/25-underwent reexposure R CFA ligation right accessory lobe bifemoral bypass graft excision of right CFA excision right femoral to above knee popliteal bypass etc. 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  Patient was admitted to rehab on 6/4 and in the process of rehab at [facility] on routine follow-up there had increased drainage and fibrinous exudate and it was felt that I&D of groin and redo right AKA site would be needed Patient was discharged back to acute care  6/13 right groin infection had an I&D and placement of 38 centimeter squared Kerecis graft with wound VAC and redo right above-knee amputation 6/18 removal of right axillofemoral and femorofemoral bypass   Plan  Nonhealing right AKA infected groin bypass removed 6/18 Treating presumed underlying MRSA from wound culture 5/26 Duration vancomycin  as per vascular-poor healing of right AKA--and Dr. Julio Ohm orthopedics to evaluate for hip disarticulation--still has mild leukocytosis despite antibiotics Tylenol  650 every 6 as needed moderate pain Oxy IR 5 every 6 as needed severe pain Dilaudid  1 mg every 3 as needed breakthrough Continue chronic OxyContin  10 at bedtime--gabapentin  300 twice daily surgeries Aspirin  on hold at this time resume after all procedures  HTN HLD Continue amlodipine  10 hydralazine  25-- pressures are little high-add agent in the next 24 if continues to be high  Anemia acute blood loss from surgery-transfused perioperative 618 Hemoglobin went from 6.5-9.3-continue management  Goals of care She is DNR but has elected to undergo  disarticulation awaiting Dr. Julio Ohm to see  DVT prophylaxis: Heparin   Status is: Inpatient Remains inpatient appropriate because:   Awaiting stability    Subjective:  Pain is moderate occasionally paroxysmal --she understands discussion with Dr. Reola Casino this morning and awaits orthopedic eval No chest pain no fever wound seems to be oozing a bit  Objective + exam Vitals:   06/23/23 0500 06/23/23 0804 06/23/23 1149 06/23/23 1514  BP:  (!) 130/47 (!) 130/39 (!) 131/48  Pulse:      Resp:      Temp:  (!) 97.5 F (36.4 C) 98.3 F (36.8 C) 99.9 F (37.7 C)  TempSrc:  Oral Oral Oral  SpO2:      Weight: 63.7 kg     Height:       Filed Weights   06/21/23 0337 06/21/23 0756 06/23/23 0500  Weight: 62.1 kg (P) 62.1 kg 63.7 kg    Examination:  EOMI NCAT no focal deficit no icterus no pallor Postop changes to chest on both sides S1-S2 no murmur seems to be sinus Abdomen soft staples in lower quadrant wound VAC in place Slightly dehiscing AKA on lateral aspect of right leg with some oozing from the wound Coherent but seems tired  Data Reviewed: reviewed   CBC    Component Value Date/Time   WBC 12.1 (H) 06/23/2023 0448   RBC 3.14 (L) 06/23/2023 0448   HGB 9.3 (L) 06/23/2023 0448   HGB 14.2 03/23/2021 0928   HCT 28.8 (L) 06/23/2023 0448   HCT 42.1 03/23/2021 0928   PLT 319 06/23/2023 0448   PLT 135 (L) 03/23/2021 0928   MCV 91.7 06/23/2023 0448   MCV 93  03/23/2021 0928   MCH 29.6 06/23/2023 0448   MCHC 32.3 06/23/2023 0448   RDW 15.9 (H) 06/23/2023 0448   RDW 12.0 03/23/2021 0928   LYMPHSABS 2.0 06/23/2023 0448   LYMPHSABS 1.9 03/23/2021 0928   MONOABS 1.4 (H) 06/23/2023 0448   EOSABS 0.2 06/23/2023 0448   EOSABS 0.3 03/23/2021 0928   BASOSABS 0.0 06/23/2023 0448   BASOSABS 0.0 03/23/2021 0928      Latest Ref Rng & Units 06/23/2023    4:48 AM 06/22/2023    6:15 AM 06/21/2023   12:43 PM  CMP  Glucose 70 - 99 mg/dL 97  696    BUN 8 - 23 mg/dL 8  6    Creatinine  2.95 - 1.00 mg/dL 2.84  1.32    Sodium 440 - 145 mmol/L 132  135  133   Potassium 3.5 - 5.1 mmol/L 3.8  3.7  4.1   Chloride 98 - 111 mmol/L 99  101    CO2 22 - 32 mmol/L 26  24    Calcium  8.9 - 10.3 mg/dL 8.7  8.8      Scheduled Meds:  amLODipine   10 mg Oral Daily   ascorbic acid   250 mg Oral BID   Chlorhexidine  Gluconate Cloth  6 each Topical Daily   docusate sodium   50 mg Oral Daily   feeding supplement  237 mL Oral BID BM   gabapentin   300 mg Oral BID   heparin   5,000 Units Subcutaneous Q8H   hydrALAZINE   25 mg Oral Q8H   multivitamin with minerals  1 tablet Oral Daily   oxyCODONE   10 mg Oral QHS   sodium chloride  flush  3 mL Intravenous Q12H   zinc  sulfate (50mg  elemental zinc )  220 mg Oral Daily   Continuous Infusions:  [START ON 06/24/2023] vancomycin       Time 22  Verlie Glisson, MD  [facility]

## 2023-06-24 DIAGNOSIS — I70261 Atherosclerosis of native arteries of extremities with gangrene, right leg: Secondary | ICD-10-CM

## 2023-06-24 DIAGNOSIS — T8781 Dehiscence of amputation stump: Secondary | ICD-10-CM

## 2023-06-24 HISTORY — DX: Dehiscence of amputation stump: T87.81

## 2023-06-24 HISTORY — DX: Atherosclerosis of native arteries of extremities with gangrene, right leg: I70.261

## 2023-06-24 LAB — BASIC METABOLIC PANEL WITH GFR
Anion gap: 7 (ref 5–15)
BUN: 6 mg/dL — ABNORMAL LOW (ref 8–23)
CO2: 25 mmol/L (ref 22–32)
Calcium: 8.4 mg/dL — ABNORMAL LOW (ref 8.9–10.3)
Chloride: 100 mmol/L (ref 98–111)
Creatinine, Ser: 0.49 mg/dL (ref 0.44–1.00)
GFR, Estimated: >60 mL/min (ref >60)
Glucose, Bld: 103 mg/dL — ABNORMAL HIGH (ref 70–99)
Potassium: 3.7 mmol/L (ref 3.5–5.1)
Sodium: 132 mmol/L — ABNORMAL LOW (ref 135–145)

## 2023-06-24 LAB — CBC WITH DIFFERENTIAL/PLATELET
Abs Immature Granulocytes: 0.65 10*3/uL — ABNORMAL HIGH (ref 0.00–0.07)
Basophils Absolute: 0.1 10*3/uL (ref 0.0–0.1)
Basophils Relative: 0 %
Eosinophils Absolute: 0.3 10*3/uL (ref 0.0–0.5)
Eosinophils Relative: 2 %
HCT: 30 % — ABNORMAL LOW (ref 36.0–46.0)
Hemoglobin: 9.8 g/dL — ABNORMAL LOW (ref 12.0–15.0)
Immature Granulocytes: 5 %
Lymphocytes Relative: 15 %
Lymphs Abs: 1.8 10*3/uL (ref 0.7–4.0)
MCH: 29.7 pg (ref 26.0–34.0)
MCHC: 32.7 g/dL (ref 30.0–36.0)
MCV: 90.9 fL (ref 80.0–100.0)
Monocytes Absolute: 1.4 10*3/uL — ABNORMAL HIGH (ref 0.1–1.0)
Monocytes Relative: 11 %
Neutro Abs: 8.3 10*3/uL — ABNORMAL HIGH (ref 1.7–7.7)
Neutrophils Relative %: 67 %
Platelets: 334 10*3/uL (ref 150–400)
RBC: 3.3 MIL/uL — ABNORMAL LOW (ref 3.87–5.11)
RDW: 15.3 % (ref 11.5–15.5)
WBC: 12.5 10*3/uL — ABNORMAL HIGH (ref 4.0–10.5)
nRBC: 0 % (ref 0.0–0.2)

## 2023-06-24 LAB — VANCOMYCIN, RANDOM: Vancomycin Rm: 6 ug/mL

## 2023-06-24 MED ORDER — VANCOMYCIN HCL 750 MG/150ML IV SOLN
750.0000 mg | Freq: Two times a day (BID) | INTRAVENOUS | Status: DC
  Administered 2023-06-25 – 2023-06-30 (×11): 750 mg via INTRAVENOUS
  Filled 2023-06-24 (×11): qty 150

## 2023-06-24 MED ORDER — METOPROLOL SUCCINATE ER 25 MG PO TB24
12.5000 mg | ORAL_TABLET | Freq: Every day | ORAL | Status: DC
  Administered 2023-06-24 – 2023-07-04 (×10): 12.5 mg via ORAL
  Filled 2023-06-24 (×10): qty 1

## 2023-06-24 NOTE — Consult Note (Signed)
 ORTHOPAEDIC CONSULTATION  REQUESTING PHYSICIAN: Samtani, Jai-Gurmukh, MD  Chief Complaint: Gangrenous ulceration and wound dehiscence right above-knee amputation.  HPI: Katherine Archer is a 68 y.o. female who presents with wound dehiscence right groin with ischemic right below-knee amputation status post revascularization.  Past Medical History:  Diagnosis Date   Cervical polyp 09/18/2018   Critical lower limb ischemia (HCC)    Encounter for screening mammogram for breast cancer 05/05/2016   Fibrocystic breast changes of both breasts 04/29/2015   Hyperlipidemia    Hypertension    PAD (peripheral artery disease) (HCC) 08/23/2019   Stroke (HCC) 08/23/2019   Past Surgical History:  Procedure Laterality Date   ABDOMINAL AORTOGRAM W/LOWER EXTREMITY Bilateral 08/23/2019   Procedure: ABDOMINAL AORTOGRAM W/ Bilateral LOWER EXTREMITY [place];  Surgeon: Harvey Carlin BRAVO, MD;  [facility]: [facility];  Service: Cardiovascular;  Laterality: Bilateral;   AMPUTATION Right 05/31/2023   Procedure: AMPUTATION, ABOVE KNEE SARTORIOUS MUSCLE FLAP;  Surgeon: Serene Gaile ORN, MD;  Location: [facility] OR;  Service: Vascular;  Laterality: Right;   APPLICATION OF WOUND VAC Right 05/31/2023   Procedure: APPLICATION, WOUND VAC;  Surgeon: Serene Gaile ORN, MD;  Location: [facility];  Service: Vascular;  Laterality: Right;   APPLICATION OF WOUND VAC Right 06/16/2023   Procedure: APPLICATION, WOUND VAC;  Surgeon: Serene Gaile ORN, MD;  Location: [facility];  Service: Vascular;  Laterality: Right;   APPLICATION OF WOUND VAC Right 06/21/2023   Procedure: APPLICATION, WOUND VAC;  Surgeon: Serene Gaile ORN, MD;  [facility]: [facility];  Service: Vascular;  Laterality: Right;   APPLICATION, SKIN SUBSTITUTE Right 05/12/2023   Procedure: APPLICATION, SKIN SUBSTITUTE KERECIS 38SQ CM;  Surgeon: Serene Gaile ORN, MD;  Location: [facility];  Service: Vascular;  Laterality: Right;   APPLICATION, SKIN SUBSTITUTE Right 06/16/2023   Procedure: APPLICATION, SKIN  SUBSTITUTE;  Surgeon: Serene Gaile ORN, MD;  [facility];  Service: Vascular;  Laterality: Right;   APPLICATION, SKIN SUBSTITUTE Right 06/21/2023   Procedure: APPLICATION, SKIN SUBSTITUTE;  Surgeon: Serene Gaile ORN, MD;  [facility];  Service: Vascular;  Laterality: Right;   AVGG REMOVAL Right 05/29/2023   Procedure: REMOVAL BYPASS GRAFT RIGHT LEG;  Surgeon: Lanis Fonda BRAVO, MD;  Location: [facility] OR;  Service: Vascular;  Laterality: Right;   AXILLARY-FEMORAL BYPASS GRAFT Right 11/11/2019   Procedure: BYPASS GRAFT RIGHT AXILLA-BIFEMORAL;  Surgeon: Harvey Carlin BRAVO, MD;  [facility];  Service: Vascular;  Laterality: Right;   AXILLARY-FEMORAL BYPASS GRAFT Left 06/21/2023   Procedure: CREATION, BYPASS, ARTERIAL, LEFT AXILLARY TO LEFT FEMORAL, USING GRAFT;  Surgeon: Serene Gaile ORN, MD;  [facility];  Service: Vascular;  Laterality: Left;   BRAIN SURGERY     BREAST SURGERY Bilateral 2014   1980 R breast surgery   CEREBRAL ANEURYSM REPAIR     ENDARTERECTOMY FEMORAL Bilateral 11/11/2019   Procedure: ENDARTERECTOMY FEMORAL BILATERALLY;  Surgeon: Harvey Carlin BRAVO, MD;  Location: [facility] OR;  Service: Vascular;  Laterality: Bilateral;   ENDARTERECTOMY FEMORAL Right 01/26/2022   Procedure: REDO RIGHT COMMON FEMORAL ENDARTERECTOMY USING 0.8CM X 7.6CM HEMASHIELD PLATINUM FINESSE PATCH;  Surgeon: Serene Gaile ORN, MD;  [facility];  Service: Vascular;  Laterality: Right;   FEMORAL-POPLITEAL BYPASS GRAFT Right 01/26/2022   Procedure: RIGHT FEMORAL-POPLITEAL BYPASS GRAFT USING GORE X 80CM PROPATEN GRAFT;  Surgeon: Serene Gaile ORN, MD;  [facility];  Service: Vascular;  Laterality: Right;   FEMORAL-POPLITEAL BYPASS GRAFT Right 05/12/2023   Procedure: BYPASS GRAFT FEMORAL-POPLITEAL ARTERY;  Surgeon:  Serene Gaile ORN, MD;  Location: [facility] OR;  Service: Vascular;  Laterality: Right;   INCISION AND DRAINAGE OF WOUND Right 05/31/2023   Procedure: IRRIGATION AND DEBRIDEMENT WOUND;  Surgeon: Serene Gaile ORN, MD;  [facility];  Service: Vascular;  Laterality: Right;  RIGHT GROIN WASHOUT   INCISION AND DRAINAGE OF WOUND Right 06/16/2023   Procedure: IRRIGATION AND DEBRIDEMENT WOUND;  Surgeon: Serene Gaile ORN, MD;  [facility];  Service: Vascular;  Laterality: Right;  Right Groin   INCISION AND DRAINAGE OF WOUND Right 06/21/2023   Procedure: IRRIGATION AND DEBRIDEMENT OF RIGHT GROIN AND RIGHT ABOVE KNEE AMPUTATION;  Surgeon: Serene Gaile ORN, MD;  [facility];  Service: Vascular;  Laterality: Right;   IR RADIOLOGIST EVAL & MGMT  04/02/2020   IR RADIOLOGIST EVAL & MGMT  03/29/2021   REMOVAL OF GRAFT Right 06/21/2023   Procedure: REMOVAL, GRAFT, RIGHT AXILLARY BIFEMORAL GRAFT;  Surgeon: Serene Gaile ORN, MD;  [facility];  Service: Vascular;  Laterality: Right;   REVISION AMPUTATION, BELOW THE KNEE Right 06/16/2023   Procedure: REVISION AMPUTATION, ABOVE THE KNEE;  Surgeon: Serene Gaile ORN, MD;  [facility];  Service: Vascular;  Laterality: Right;   THROMBECTOMY FEMORAL ARTERY Right 01/26/2022   Procedure: THROMBECTOMY RIGHT FEMORAL ARTERY;  Surgeon: Serene Gaile ORN, MD;  [facility];  Service: Vascular;  Laterality: Right;   THROMBECTOMY OF BYPASS GRAFT FEMORAL- TIBIAL ARTERY Right 05/12/2023   Procedure: THROMBECTOMY, BYPASS GRAFT, ARTERIAL;  Surgeon: Serene Gaile ORN, MD;  Location: [facility];  Service: Vascular;  Laterality: Right;   TUBAL LIGATION  1990   Social History   Socioeconomic History   Marital status: Divorced    Spouse name: Not on file   Number of children: Not on file   Years of education: Not on file   Highest education level: Not on file  Occupational History   Not on file  Tobacco Use   Smoking status: Former    Current packs/day: 0.00    Types: Cigarettes    Quit date: 10/04/2019    Years since quitting: 3.7    Passive exposure: Never   Smokeless tobacco: Never  Vaping Use   Vaping status: Never Used  Substance and Sexual Activity   Alcohol  use: No    Drug use: No   Sexual activity: Not Currently  Other Topics Concern   Not on file  Social History Narrative   Not on file   Social Drivers of Health   Financial Resource Strain: Not on file  Food Insecurity: No Food Insecurity (06/17/2023)   Hunger Vital Sign    Worried About Running Out of Food in the Last Year: Never true    Ran Out of Food in the Last Year: Never true  Transportation Needs: No Transportation Needs (06/17/2023)   PRAPARE - Transportation    Lack of Transportation (Medical): No    Lack of Transportation (Non-Medical): No  Physical Activity: Not on file  Stress: Not on file  Social Connections: Socially Isolated (06/17/2023)   Social Connection and Isolation Panel    Frequency of Communication with Friends and Family: More than three times a week    Frequency of Social Gatherings with Friends and Family: More than three times a week    Attends Religious Services: Never    Database administrator or Organizations: No    Attends Banker Meetings: Never    Marital Status: Widowed   Family History  Problem  Relation Age of Onset   Heart Problems Father    Heart Problems Brother    Heart Problems Brother    - negative except otherwise stated in the family history section Allergies  Allergen Reactions   Penicillins Other (See Comments)    Shaky and Dizziness 20 years ago   Prior to Admission medications   Medication Sig Start Date End Date Taking? Authorizing Provider  acetaminophen  (TYLENOL ) 325 MG tablet Take 2 tablets (650 mg total) by mouth every 6 (six) hours as needed for mild pain (pain score 1-3) or moderate pain (pain score 4-6). 06/14/23  Yes Angiulli, Toribio PARAS, PA-C  alendronate (FOSAMAX) 70 MG tablet Take 70 mg by mouth once a week. 12/28/20  Yes [provider]  amLODipine  (NORVASC ) 10 MG tablet Take 1 tablet (10 mg total) by mouth daily. 06/14/23 07/14/23 Yes Angiulli, Toribio PARAS, PA-C  ascorbic acid  (VITAMIN C ) 500 MG tablet Take  0.5 tablets (250 mg total) by mouth 2 (two) times daily. 06/14/23  Yes Angiulli, Toribio PARAS, PA-C  aspirin  EC 81 MG tablet Take 1 tablet (81 mg total) by mouth daily. Swallow whole. 06/08/23  Yes Leotis Bogus, MD  ezetimibe  (ZETIA ) 10 MG tablet Take 1 tablet (10 mg total) by mouth daily. 06/14/23  Yes Angiulli, Toribio PARAS, PA-C  ferrous sulfate  325 (65 FE) MG tablet Take 1 tablet (325 mg total) by mouth daily with breakfast. 06/15/23  Yes Angiulli, Toribio PARAS, PA-C  gabapentin  (NEURONTIN ) 300 MG capsule Take 1 capsule (300 mg total) by mouth 2 (two) times daily. 06/14/23 07/14/23 Yes Angiulli, Toribio PARAS, PA-C  hydrALAZINE  (APRESOLINE ) 25 MG tablet Take 1 tablet (25 mg total) by mouth every 8 (eight) hours. 06/14/23  Yes Angiulli, Toribio PARAS, PA-C  hydrochlorothiazide  (HYDRODIURIL ) 12.5 MG tablet Take 1 tablet (12.5 mg total) by mouth daily. 06/14/23 07/14/23 Yes Angiulli, Toribio PARAS, PA-C  irbesartan  (AVAPRO ) 75 MG tablet Take 1 tablet (75 mg total) by mouth daily. 06/15/23  Yes Angiulli, Toribio PARAS, PA-C  oxyCODONE  (OXY IR/ROXICODONE ) 5 MG immediate release tablet Take 1-2 tablets (5-10 mg total) by mouth every 4 (four) hours as needed for severe pain (pain score 7-10). 06/14/23  Yes Angiulli, Toribio PARAS, PA-C  oxyCODONE  (OXYCONTIN ) 10 mg 12 hr tablet Take 1 tablet (10 mg total) by mouth at bedtime. 06/14/23  Yes Angiulli, Daniel J, PA-C  polyethylene glycol powder (GLYCOLAX /MIRALAX ) 17 GM/SCOOP powder Take 1 capful (17 g) by mouth daily as needed for mild constipation. 05/15/23  Yes Verdene Purchase, MD  vancomycin  (VANCOREADY) 1250 MG/250ML SOLN Inject 250 mLs (1,250 mg total) into the vein daily. 06/15/23  Yes Angiulli, Toribio PARAS, PA-C  Zinc  Sulfate 220 (50 Zn) MG TABS Take 1 tablet (220 mg total) by mouth daily. 06/15/23  Yes Angiulli, Toribio PARAS, PA-C  vancomycin  IVPB Inject 1,500 mg into the vein daily. Indication:  Bacteremia/R-groin graft infection First Dose: Yes Last Day of Therapy:  07/12/23 Labs - Sunday/Monday:  CBC/D,  BMP, and vancomycin  trough. Labs - Thursday:  BMP and vancomycin  trough Labs - Once weekly: ESR and CRP Method of administration:Elastomeric Method of administration may be changed at the discretion of the patient and/or caregiver's ability to self-administer the medication ordered. 06/07/23 07/12/23  Luiz Channel, MD  vancomycin  IVPB Inject 1,250 mg into the vein daily. Indication:  BSI First Dose: Yes Last Day of Therapy:  07/12/2023 Labs - Sunday/Monday:  CBC/D, BMP, and vancomycin  trough. Labs - Thursday:  BMP and vancomycin  trough Labs - Once weekly:  ESR and CRP Method of administration:Elastomeric Method of administration may be changed at the discretion of the patient and/or caregiver's ability to self-administer the medication ordered. 06/14/23 07/14/23  Angiulli, Toribio PARAS, PA-C   No results found. - pertinent xrays, CT, MRI studies were reviewed and independently interpreted  Positive ROS: All other systems have been reviewed and were otherwise negative with the exception of those mentioned in the HPI and as above.  Physical Exam: General: Alert, no acute distress Psychiatric: Patient is competent for consent with normal mood and affect Lymphatic: No axillary or cervical lymphadenopathy Cardiovascular: No pedal edema Respiratory: No cyanosis, no use of accessory musculature GI: No organomegaly, abdomen is soft and non-tender    Images:  @ENCIMAGES @  Labs:  Lab Results  Component Value Date   HGBA1C 4.7 (L) 05/30/2023   HGBA1C 5.3 08/24/2019   REPTSTATUS 06/04/2023 FINAL 05/30/2023   REPTSTATUS 06/04/2023 FINAL 05/30/2023   GRAMSTAIN  05/29/2023    WBC PRESENT, PREDOMINANTLY MONONUCLEAR RARE GRAM POSITIVE COCCI IN PAIRS    CULT  05/30/2023    NO GROWTH 5 DAYS Performed at [facility] Lab, [address], [place], [place] [address]    CULT  05/30/2023    NO GROWTH 5 DAYS Performed at [facility], [address] [place], [place] [address]    [facility]  METHICILLIN RESISTANT STAPHYLOCOCCUS AUREUS 05/29/2023    Lab Results  Component Value Date   ALBUMIN  1.9 (L) 06/19/2023   ALBUMIN  2.5 (L) 06/16/2023   ALBUMIN  1.9 (L) 06/08/2023        Latest Ref Rng & Units 06/24/2023    5:18 AM 06/23/2023    4:48 AM 06/22/2023    6:15 AM  CBC EXTENDED  WBC 4.0 - 10.5 K/uL 12.5  12.1  13.9   RBC 3.87 - 5.11 MIL/uL 3.30  3.14  3.17   Hemoglobin 12.0 - 15.0 g/dL 9.8  9.3  9.6   HCT 63.9 - 46.0 % 30.0  28.8  28.5   Platelets 150 - 400 K/uL 334  319  311   NEUT# 1.7 - 7.7 K/uL 8.3  8.1  10.1   Lymph# 0.7 - 4.0 K/uL 1.8  2.0  2.1     Neurologic: Patient does not have protective sensation bilateral lower extremities.   MUSCULOSKELETAL:   Skin: Examination the above-knee amputation has ischemic necrotic wound dehiscence.  There is dehiscence of the groin wound.  Patient's hemoglobin is 9.8 with a white cell count of 12.5.  Albumin  1.9.  No cellulitis.  Assessment: Assessment: Severe peripheral vascular disease with dehiscence of the right groin wound and dehiscence of the right above-knee amputation with gangrenous changes.  Plan: Discussed with the patient and her family surgical treatment would be a hip disarticulation on the right.  With patient's poor nutritional status and poor microcirculation she has an increased risk of wound not healing and surgical complications.  Patient and family state they understand and wish to proceed with surgery at this time.  I will plan for surgery on Wednesday.  Thank you for the consult and the opportunity to see Ms. Lacara Dunsworth, MD [facility] [phone] 10:27 AM

## 2023-06-24 NOTE — Progress Notes (Addendum)
  Progress Note    06/24/2023 8:34 AM 3 Days Post-Op  Subjective:  pain R AKA stump   Vitals:   06/24/23 0508 06/24/23 0811  BP: (!) 130/43 (!) 131/42  Pulse:  66  Resp:  20  Temp:  97.7 F (36.5 C)  SpO2:  100%   Physical Exam: Lungs:  non labored Incisions:  R groin with vac with good seal; R AKA with fibrinous exudate and devitalized tissue; L chest and groin c/d/i Extremities:  L foot warm with motor and sensation intact Neurologic: A&O  CBC    Component Value Date/Time   WBC 12.5 (H) 06/24/2023 0518   RBC 3.30 (L) 06/24/2023 0518   HGB 9.8 (L) 06/24/2023 0518   HGB 14.2 03/23/2021 0928   HCT 30.0 (L) 06/24/2023 0518   HCT 42.1 03/23/2021 0928   PLT 334 06/24/2023 0518   PLT 135 (L) 03/23/2021 0928   MCV 90.9 06/24/2023 0518   MCV 93 03/23/2021 0928   MCH 29.7 06/24/2023 0518   MCHC 32.7 06/24/2023 0518   RDW 15.3 06/24/2023 0518   RDW 12.0 03/23/2021 0928   LYMPHSABS 1.8 06/24/2023 0518   LYMPHSABS 1.9 03/23/2021 0928   MONOABS 1.4 (H) 06/24/2023 0518   EOSABS 0.3 06/24/2023 0518   EOSABS 0.3 03/23/2021 0928   BASOSABS 0.1 06/24/2023 0518   BASOSABS 0.0 03/23/2021 0928    BMET    Component Value Date/Time   NA 132 (L) 06/24/2023 0518   NA 138 01/15/2020 1128   K 3.7 06/24/2023 0518   CL 100 06/24/2023 0518   CO2 25 06/24/2023 0518   GLUCOSE 103 (H) 06/24/2023 0518   BUN 6 (L) 06/24/2023 0518   BUN 8 01/15/2020 1128   CREATININE 0.49 06/24/2023 0518   CALCIUM  8.4 (L) 06/24/2023 0518   GFRNONAA >60 06/24/2023 0518   GFRAA 80 01/15/2020 1128    INR    Component Value Date/Time   INR 1.5 (H) 05/29/2023 0030     Intake/Output Summary (Last 24 hours) at 06/24/2023 0834 Last data filed at 06/24/2023 0600 Gross per 24 hour  Intake 480 ml  Output 2700 ml  Net -2220 ml     Assessment/Plan:  68 y.o. female with infected R ax-bifemoral bypass 3 Days Post-Op   L foot warm and well perfused R AKA failing; Dr. Harden consulted for hip  disarticulation; after discussion with family and palliative, they would like to proceed Continue wound vac R groin; we will change this next week   Donnice Sender, PA-C [facility] [phone] 06/24/2023 8:34 AM  I have independently interviewed and examined patient and agree with PA assessment and plan above.  Dr. Crist assistance much appreciated.  Alfonzia Woolum C. Sheree, MD [facility] [place] Office: [phone] Pager: [phone]

## 2023-06-24 NOTE — Progress Notes (Signed)
 Pharmacy Antibiotic Note  Katherine Archer is a 68 y.o. female admitted on 06/16/2023 with bacteremia and right groin graft infection. Katherine Archer  Pharmacy has been consulted for vancomycin  dosing. Ortho planning surgery for Wednesday 06/28/23  Aim for Vancomycin  trough 15-20 or AUC 400-550  Can consider Linezolid  for last 2 weeks of (IV) therapy? After initial 6 weeks, plan to transition to doxycycline and penicillin  for 2 months, then doxycyline suppression indefinitely Last note from ID was 06/07/23  Levels:  6/20 Peak      = 34 @1236  6/21 Random =  6 @0518   Plan: Adjust vancomycin  to 750 mg IV every 12 hours  (estAUC 438.9) End Date: 07/12/23 Monitor clinical progress, cultures/sensitivities, renal function, abx plan Vancomycin  levels as indicated   Height: (P) 5' 6 (167.6 cm) Weight: 68.1 kg (150 lb 2.1 oz) IBW/kg (Calculated) : (P) 59.3  Temp (24hrs), Avg:98.6 F (37 C), Min:97.7 F (36.5 C), Max:99.9 F (37.7 C)  Recent Labs  Lab 06/19/23 0423 06/19/23 0700 06/20/23 0830 06/20/23 1400 06/22/23 0615 06/23/23 0448 06/23/23 1236 06/24/23 0518  WBC  --  10.8* 9.5  --  13.9* 12.1*  --  12.5*  CREATININE 0.59  --   --   --  0.39* 0.40*  --  0.49  VANCOPEAK  --   --   --  31  --   --  34  --   VANCORANDOM  --   --   --   --   --   --   --  6    Estimated Creatinine Clearance: 64.8 mL/min (by C-G formula based on SCr of 0.49 mg/dL).    Allergies  Allergen Reactions   Penicillins Other (See Comments)    Shaky and Dizziness 20 years ago    Dose adjustments this admission: 6/21 adjust vanc (peak 34, random 6)  Antimicrobials this admission: Vanc 5/26 > 5/27, 6/3 >> (7/9) Zosyn  5/26 > 5/27 PCN 5/27 > 5/29 Zyvox  5/27 > 5/29 Dapto 5/29 > 6/03   Microbiology results: 5/25 BCx: GPC in 1/2 bottles, BCID with strep pyogenes 5/26 Abscess Cx: MRSA, strep pyogenes 5/27 BCx: negative   Thank you for allowing pharmacy to be a part of this patient's care.   Katherine Archer,  PharmD 06/24/2023 12:01 PM  **Pharmacist phone directory can be found on [email or web address] listed under [facility]**

## 2023-06-24 NOTE — Plan of Care (Signed)
  Problem: Clinical Measurements: Goal: Cardiovascular complication will be avoided Outcome: Progressing   Problem: Coping: Goal: Level of anxiety will decrease Outcome: Progressing   Problem: Elimination: Goal: Will not experience complications related to bowel motility Outcome: Progressing   Problem: Pain Managment: Goal: General experience of comfort will improve and/or be controlled Outcome: Progressing   Problem: Skin Integrity: Goal: Risk for impaired skin integrity will decrease Outcome: Progressing

## 2023-06-24 NOTE — Progress Notes (Signed)
 [facility] ROUNDING NOTE Katherine Archer FMW:[id number]  DOB: 08-Dec-1955  DOA: 06/16/2023  PCP: Katherine Glisson, MD  06/24/2023,3:50 PM  LOS: 8 days    Code Status: Full code   from: CIR current Dispo: Unclear if   68 year old female with known HTN HLD PAD on Eliquis  Prior smoker until 2022 Right femoropopliteal EGD 05/12/2023 discharged 05/15/2023--complicated by wound dehiscence sepsis 5/25-underwent reexposure R CFA ligation right accessory lobe bifemoral bypass graft excision of right CFA excision right femoral to above knee popliteal bypass etc. 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  Patient was admitted to rehab on 6/4 and in the process of rehab at [facility] on routine follow-up there had increased drainage and fibrinous exudate and it was felt that I&D of groin and redo right AKA site would be needed Patient was discharged back to acute care  6/13 right groin infection had an I&D and placement of 38 centimeter squared Kerecis graft with wound VAC and redo right above-knee amputation 6/18 removal of right axillofemoral and femorofemoral bypass   Plan  Nonhealing right AKA infected groin bypass removed 6/18 Treating presumed underlying MRSA from wound culture 5/26 Duration vancomycin  as per vascular-poor healing of right AKA-- Patient has consented for  Dr. Harden to perform Hip disarticulation 06/28/23--family on board Pain controlled on Tylenol  650 every 6 as needed moderate pain Oxy IR 5 every 6 as needed severe pain Dilaudid  1 mg every 3 as needed breakthrough Continue chronic OxyContin  10 at bedtime--gabapentin  300 twice daily surgeries Aspirin  on hold at this time resume after all procedures  HTN HLD Continue amlodipine  10 hydralazine  25-- adding toprol  xl 12.5  Anemia acute blood loss from surgery-transfused perioperative 618 Hemoglobin apporp Go to less frequent lab draw  Goals of care She is DNR but has elected to undergo disarticulation   DVT prophylaxis: Heparin   Status is:  Inpatient Remains inpatient appropriate because:   Awaiting stability    Subjective:  Doing well Pain moderate Electing on surgery  Objective + exam Vitals:   06/24/23 0504 06/24/23 0508 06/24/23 0811 06/24/23 1251  BP:  (!) 130/43 (!) 131/42 (!) 164/44  Pulse: 72  66 91  Resp: 16  20 20   Temp:   97.7 F (36.5 C) (!) 97.5 F (36.4 C)  TempSrc:   Oral Oral  SpO2: 100%  100% 100%  Weight: 68.1 kg     Height:       Filed Weights   06/21/23 0756 06/23/23 0500 06/24/23 0504  Weight: (P) 62.1 kg 63.7 kg 68.1 kg    Examination:  EOMI NCAT no focal deficit no icterus no pallor Postop changes to chest on both sides S1-S2 no murmur seems to be sinus Abdomen soft-- staples in lower quadrant wound VAC in place Re-inforced bandage on RLE stump   Data Reviewed: reviewed   CBC    Component Value Date/Time   WBC 12.5 (H) 06/24/2023 0518   RBC 3.30 (L) 06/24/2023 0518   HGB 9.8 (L) 06/24/2023 0518   HGB 14.2 03/23/2021 0928   HCT 30.0 (L) 06/24/2023 0518   HCT 42.1 03/23/2021 0928   PLT 334 06/24/2023 0518   PLT 135 (L) 03/23/2021 0928   MCV 90.9 06/24/2023 0518   MCV 93 03/23/2021 0928   MCH 29.7 06/24/2023 0518   MCHC 32.7 06/24/2023 0518   RDW 15.3 06/24/2023 0518   RDW 12.0 03/23/2021 0928   LYMPHSABS 1.8 06/24/2023 0518   LYMPHSABS 1.9 03/23/2021 0928   MONOABS 1.4 (H) 06/24/2023 0518  EOSABS 0.3 06/24/2023 0518   EOSABS 0.3 03/23/2021 0928   BASOSABS 0.1 06/24/2023 0518   BASOSABS 0.0 03/23/2021 0928      Latest Ref Rng & Units 06/24/2023    5:18 AM 06/23/2023    4:48 AM 06/22/2023    6:15 AM  CMP  Glucose 70 - 99 mg/dL 896  97  890   BUN 8 - 23 mg/dL 6  8  6    Creatinine 0.44 - 1.00 mg/dL 9.50  9.59  9.60   Sodium 135 - 145 mmol/L 132  132  135   Potassium 3.5 - 5.1 mmol/L 3.7  3.8  3.7   Chloride 98 - 111 mmol/L 100  99  101   CO2 22 - 32 mmol/L 25  26  24    Calcium  8.9 - 10.3 mg/dL 8.4  8.7  8.8     Scheduled Meds:  amLODipine   10 mg Oral  Daily   ascorbic acid   250 mg Oral BID   Chlorhexidine  Gluconate Cloth  6 each Topical Daily   docusate sodium   50 mg Oral Daily   feeding supplement  237 mL Oral BID BM   gabapentin   300 mg Oral BID   heparin   5,000 Units Subcutaneous Q8H   hydrALAZINE   25 mg Oral Q8H   metoprolol  succinate  12.5 mg Oral Daily   multivitamin with minerals  1 tablet Oral Daily   oxyCODONE   10 mg Oral QHS   polyethylene glycol  17 g Oral Daily   sodium chloride  flush  3 mL Intravenous Q12H   Continuous Infusions:  [START ON 06/25/2023] vancomycin       Time 15  Katherine [name], MD  [facility]

## 2023-06-24 NOTE — H&P (View-Only) (Signed)
 ORTHOPAEDIC CONSULTATION  REQUESTING PHYSICIAN: Samtani, Jai-Gurmukh, MD  Chief Complaint: Gangrenous ulceration and wound dehiscence right above-knee amputation.  HPI: Katherine Archer is a 68 y.o. female who presents with wound dehiscence right groin with ischemic right below-knee amputation status post revascularization.  Past Medical History:  Diagnosis Date   Cervical polyp 09/18/2018   Critical lower limb ischemia (HCC)    Encounter for screening mammogram for breast cancer 05/05/2016   Fibrocystic breast changes of both breasts 04/29/2015   Hyperlipidemia    Hypertension    PAD (peripheral artery disease) (HCC) 08/23/2019   Stroke (HCC) 08/23/2019   Past Surgical History:  Procedure Laterality Date   ABDOMINAL AORTOGRAM W/LOWER EXTREMITY Bilateral 08/23/2019   Procedure: ABDOMINAL AORTOGRAM W/ Bilateral LOWER EXTREMITY [place];  Surgeon: Harvey Carlin BRAVO, MD;  Location: [facility];  Service: Cardiovascular;  Laterality: Bilateral;   AMPUTATION Right 05/31/2023   Procedure: AMPUTATION, ABOVE KNEE SARTORIOUS MUSCLE FLAP;  Surgeon: Serene Gaile ORN, MD;  Location: [facility] OR;  Service: Vascular;  Laterality: Right;   APPLICATION OF WOUND VAC Right 05/31/2023   Procedure: APPLICATION, WOUND VAC;  Surgeon: Serene Gaile ORN, MD;  Location: [facility] OR;  Service: Vascular;  Laterality: Right;   APPLICATION OF WOUND VAC Right 06/16/2023   Procedure: APPLICATION, WOUND VAC;  Surgeon: Serene Gaile ORN, MD;  Location: [facility] OR;  Service: Vascular;  Laterality: Right;   APPLICATION OF WOUND VAC Right 06/21/2023   Procedure: APPLICATION, WOUND VAC;  Surgeon: Serene Gaile ORN, MD;  Location: [facility];  Service: Vascular;  Laterality: Right;   APPLICATION, SKIN SUBSTITUTE Right 05/12/2023   Procedure: APPLICATION, SKIN SUBSTITUTE KERECIS 38SQ CM;  Surgeon: Serene Gaile ORN, MD;  Location: [facility];  Service: Vascular;  Laterality: Right;   APPLICATION, SKIN SUBSTITUTE Right 06/16/2023   Procedure: APPLICATION, SKIN  SUBSTITUTE;  Surgeon: Serene Gaile ORN, MD;  Location: [facility];  Service: Vascular;  Laterality: Right;   APPLICATION, SKIN SUBSTITUTE Right 06/21/2023   Procedure: APPLICATION, SKIN SUBSTITUTE;  Surgeon: Serene Gaile ORN, MD;  Location: [facility];  Service: Vascular;  Laterality: Right;   AVGG REMOVAL Right 05/29/2023   Procedure: REMOVAL BYPASS GRAFT RIGHT LEG;  Surgeon: Lanis Fonda BRAVO, MD;  Location: [facility] OR;  Service: Vascular;  Laterality: Right;   AXILLARY-FEMORAL BYPASS GRAFT Right 11/11/2019   Procedure: BYPASS GRAFT RIGHT AXILLA-BIFEMORAL;  Surgeon: Harvey Carlin BRAVO, MD;  Location: [facility];  Service: Vascular;  Laterality: Right;   AXILLARY-FEMORAL BYPASS GRAFT Left 06/21/2023   Procedure: CREATION, BYPASS, ARTERIAL, LEFT AXILLARY TO LEFT FEMORAL, USING GRAFT;  Surgeon: Serene Gaile ORN, MD;  Location: [facility];  Service: Vascular;  Laterality: Left;   BRAIN SURGERY     BREAST SURGERY Bilateral 2014   1980 R breast surgery   CEREBRAL ANEURYSM REPAIR     ENDARTERECTOMY FEMORAL Bilateral 11/11/2019   Procedure: ENDARTERECTOMY FEMORAL BILATERALLY;  Surgeon: Harvey Carlin BRAVO, MD;  Location: [facility] OR;  Service: Vascular;  Laterality: Bilateral;   ENDARTERECTOMY FEMORAL Right 01/26/2022   Procedure: REDO RIGHT COMMON FEMORAL ENDARTERECTOMY USING 0.8CM X 7.6CM HEMASHIELD PLATINUM FINESSE PATCH;  Surgeon: Serene Gaile ORN, MD;  [facility]: [facility];  Service: Vascular;  Laterality: Right;   FEMORAL-POPLITEAL BYPASS GRAFT Right 01/26/2022   Procedure: RIGHT FEMORAL-POPLITEAL BYPASS GRAFT USING GORE X 80CM PROPATEN GRAFT;  Surgeon: Serene Gaile ORN, MD;  [facility];  Service: Vascular;  Laterality: Right;   FEMORAL-POPLITEAL BYPASS GRAFT Right 05/12/2023   Procedure: BYPASS GRAFT FEMORAL-POPLITEAL ARTERY;  Surgeon:  Serene Gaile ORN, MD;  Location: [facility] OR;  Service: Vascular;  Laterality: Right;   INCISION AND DRAINAGE OF WOUND Right 05/31/2023   Procedure: IRRIGATION AND DEBRIDEMENT WOUND;  Surgeon: Serene Gaile ORN, MD;  Location: [facility];  Service: Vascular;  Laterality: Right;  RIGHT GROIN WASHOUT   INCISION AND DRAINAGE OF WOUND Right 06/16/2023   Procedure: IRRIGATION AND DEBRIDEMENT WOUND;  Surgeon: Serene Gaile ORN, MD;  [facility]: [facility];  Service: Vascular;  Laterality: Right;  Right Groin   INCISION AND DRAINAGE OF WOUND Right 06/21/2023   Procedure: IRRIGATION AND DEBRIDEMENT OF RIGHT GROIN AND RIGHT ABOVE KNEE AMPUTATION;  Surgeon: Serene Gaile ORN, MD;  [facility]: [facility];  Service: Vascular;  Laterality: Right;   IR RADIOLOGIST EVAL & MGMT  04/02/2020   IR RADIOLOGIST EVAL & MGMT  03/29/2021   REMOVAL OF GRAFT Right 06/21/2023   Procedure: REMOVAL, GRAFT, RIGHT AXILLARY BIFEMORAL GRAFT;  Surgeon: Serene Gaile ORN, MD;  [facility]: [facility];  Service: Vascular;  Laterality: Right;   REVISION AMPUTATION, BELOW THE KNEE Right 06/16/2023   Procedure: REVISION AMPUTATION, ABOVE THE KNEE;  Surgeon: Serene Gaile ORN, MD;  [facility];  Service: Vascular;  Laterality: Right;   THROMBECTOMY FEMORAL ARTERY Right 01/26/2022   Procedure: THROMBECTOMY RIGHT FEMORAL ARTERY;  Surgeon: Serene Gaile ORN, MD;  [facility];  Service: Vascular;  Laterality: Right;   THROMBECTOMY OF BYPASS GRAFT FEMORAL- TIBIAL ARTERY Right 05/12/2023   Procedure: THROMBECTOMY, BYPASS GRAFT, ARTERIAL;  Surgeon: Serene Gaile ORN, MD;  Location: [facility];  Service: Vascular;  Laterality: Right;   TUBAL LIGATION  1990   Social History   Socioeconomic History   Marital status: Divorced    Spouse name: Not on file   Number of children: Not on file   Years of education: Not on file   Highest education level: Not on file  Occupational History   Not on file  Tobacco Use   Smoking status: Former    Current packs/day: 0.00    Types: Cigarettes    Quit date: 10/04/2019    Years since quitting: 3.7    Passive exposure: Never   Smokeless tobacco: Never  Vaping Use   Vaping status: Never Used  Substance and Sexual Activity   Alcohol  use: No    Drug use: No   Sexual activity: Not Currently  Other Topics Concern   Not on file  Social History Narrative   Not on file   Social Drivers of Health   Financial Resource Strain: Not on file  Food Insecurity: No Food Insecurity (06/17/2023)   Hunger Vital Sign    Worried About Running Out of Food in the Last Year: Never true    Ran Out of Food in the Last Year: Never true  Transportation Needs: No Transportation Needs (06/17/2023)   PRAPARE - Transportation    Lack of Transportation (Medical): No    Lack of Transportation (Non-Medical): No  Physical Activity: Not on file  Stress: Not on file  Social Connections: Socially Isolated (06/17/2023)   Social Connection and Isolation Panel    Frequency of Communication with Friends and Family: More than three times a week    Frequency of Social Gatherings with Friends and Family: More than three times a week    Attends Religious Services: Never    Database administrator or Organizations: No    Attends Banker Meetings: Never    Marital Status: Widowed   Family History  Problem  Relation Age of Onset   Heart Problems Father    Heart Problems Brother    Heart Problems Brother    - negative except otherwise stated in the family history section Allergies  Allergen Reactions   Penicillins Other (See Comments)    Shaky and Dizziness 20 years ago   Prior to Admission medications   Medication Sig Start Date End Date Taking? Authorizing Provider  acetaminophen  (TYLENOL ) 325 MG tablet Take 2 tablets (650 mg total) by mouth every 6 (six) hours as needed for mild pain (pain score 1-3) or moderate pain (pain score 4-6). 06/14/23  Yes Angiulli, Toribio PARAS, PA-C  alendronate (FOSAMAX) 70 MG tablet Take 70 mg by mouth once a week. 12/28/20  Yes [provider]  amLODipine  (NORVASC ) 10 MG tablet Take 1 tablet (10 mg total) by mouth daily. 06/14/23 07/14/23 Yes Angiulli, Toribio PARAS, PA-C  ascorbic acid  (VITAMIN C ) 500 MG tablet Take  0.5 tablets (250 mg total) by mouth 2 (two) times daily. 06/14/23  Yes Angiulli, Toribio PARAS, PA-C  aspirin  EC 81 MG tablet Take 1 tablet (81 mg total) by mouth daily. Swallow whole. 06/08/23  Yes Leotis Bogus, MD  ezetimibe  (ZETIA ) 10 MG tablet Take 1 tablet (10 mg total) by mouth daily. 06/14/23  Yes Angiulli, Toribio PARAS, PA-C  ferrous sulfate  325 (65 FE) MG tablet Take 1 tablet (325 mg total) by mouth daily with breakfast. 06/15/23  Yes Angiulli, Toribio PARAS, PA-C  gabapentin  (NEURONTIN ) 300 MG capsule Take 1 capsule (300 mg total) by mouth 2 (two) times daily. 06/14/23 07/14/23 Yes Angiulli, Toribio PARAS, PA-C  hydrALAZINE  (APRESOLINE ) 25 MG tablet Take 1 tablet (25 mg total) by mouth every 8 (eight) hours. 06/14/23  Yes Angiulli, Toribio PARAS, PA-C  hydrochlorothiazide  (HYDRODIURIL ) 12.5 MG tablet Take 1 tablet (12.5 mg total) by mouth daily. 06/14/23 07/14/23 Yes Angiulli, Toribio PARAS, PA-C  irbesartan  (AVAPRO ) 75 MG tablet Take 1 tablet (75 mg total) by mouth daily. 06/15/23  Yes Angiulli, Toribio PARAS, PA-C  oxyCODONE  (OXY IR/ROXICODONE ) 5 MG immediate release tablet Take 1-2 tablets (5-10 mg total) by mouth every 4 (four) hours as needed for severe pain (pain score 7-10). 06/14/23  Yes Angiulli, Toribio PARAS, PA-C  oxyCODONE  (OXYCONTIN ) 10 mg 12 hr tablet Take 1 tablet (10 mg total) by mouth at bedtime. 06/14/23  Yes Angiulli, Daniel J, PA-C  polyethylene glycol powder (GLYCOLAX /MIRALAX ) 17 GM/SCOOP powder Take 1 capful (17 g) by mouth daily as needed for mild constipation. 05/15/23  Yes Verdene Purchase, MD  vancomycin  (VANCOREADY) 1250 MG/250ML SOLN Inject 250 mLs (1,250 mg total) into the vein daily. 06/15/23  Yes Angiulli, Toribio PARAS, PA-C  Zinc  Sulfate 220 (50 Zn) MG TABS Take 1 tablet (220 mg total) by mouth daily. 06/15/23  Yes Angiulli, Toribio PARAS, PA-C  vancomycin  IVPB Inject 1,500 mg into the vein daily. Indication:  Bacteremia/R-groin graft infection First Dose: Yes Last Day of Therapy:  07/12/23 Labs - Sunday/Monday:  CBC/D,  BMP, and vancomycin  trough. Labs - Thursday:  BMP and vancomycin  trough Labs - Once weekly: ESR and CRP Method of administration:Elastomeric Method of administration may be changed at the discretion of the patient and/or caregiver's ability to self-administer the medication ordered. 06/07/23 07/12/23  Luiz Channel, MD  vancomycin  IVPB Inject 1,250 mg into the vein daily. Indication:  BSI First Dose: Yes Last Day of Therapy:  07/12/2023 Labs - Sunday/Monday:  CBC/D, BMP, and vancomycin  trough. Labs - Thursday:  BMP and vancomycin  trough Labs - Once weekly:  ESR and CRP Method of administration:Elastomeric Method of administration may be changed at the discretion of the patient and/or caregiver's ability to self-administer the medication ordered. 06/14/23 07/14/23  Angiulli, Toribio PARAS, PA-C   No results found. - pertinent xrays, CT, MRI studies were reviewed and independently interpreted  Positive ROS: All other systems have been reviewed and were otherwise negative with the exception of those mentioned in the HPI and as above.  Physical Exam: General: Alert, no acute distress Psychiatric: Patient is competent for consent with normal mood and affect Lymphatic: No axillary or cervical lymphadenopathy Cardiovascular: No pedal edema Respiratory: No cyanosis, no use of accessory musculature GI: No organomegaly, abdomen is soft and non-tender    Images:  @ENCIMAGES @  Labs:  Lab Results  Component Value Date   HGBA1C 4.7 (L) 05/30/2023   HGBA1C 5.3 08/24/2019   REPTSTATUS 06/04/2023 FINAL 05/30/2023   REPTSTATUS 06/04/2023 FINAL 05/30/2023   GRAMSTAIN  05/29/2023    WBC PRESENT, PREDOMINANTLY MONONUCLEAR RARE GRAM POSITIVE COCCI IN PAIRS    CULT  05/30/2023    NO GROWTH 5 DAYS Performed at [facility] Lab, [address], [place], [place] [address]    CULT  05/30/2023    NO GROWTH 5 DAYS Performed at [facility], [address] [place], [place] [address]    [facility]  METHICILLIN RESISTANT STAPHYLOCOCCUS AUREUS 05/29/2023    Lab Results  Component Value Date   ALBUMIN  1.9 (L) 06/19/2023   ALBUMIN  2.5 (L) 06/16/2023   ALBUMIN  1.9 (L) 06/08/2023        Latest Ref Rng & Units 06/24/2023    5:18 AM 06/23/2023    4:48 AM 06/22/2023    6:15 AM  CBC EXTENDED  WBC 4.0 - 10.5 K/uL 12.5  12.1  13.9   RBC 3.87 - 5.11 MIL/uL 3.30  3.14  3.17   Hemoglobin 12.0 - 15.0 g/dL 9.8  9.3  9.6   HCT 63.9 - 46.0 % 30.0  28.8  28.5   Platelets 150 - 400 K/uL 334  319  311   NEUT# 1.7 - 7.7 K/uL 8.3  8.1  10.1   Lymph# 0.7 - 4.0 K/uL 1.8  2.0  2.1     Neurologic: Patient does not have protective sensation bilateral lower extremities.   MUSCULOSKELETAL:   Skin: Examination the above-knee amputation has ischemic necrotic wound dehiscence.  There is dehiscence of the groin wound.  Patient's hemoglobin is 9.8 with a white cell count of 12.5.  Albumin  1.9.  No cellulitis.  Assessment: Assessment: Severe peripheral vascular disease with dehiscence of the right groin wound and dehiscence of the right above-knee amputation with gangrenous changes.  Plan: Discussed with the patient and her family surgical treatment would be a hip disarticulation on the right.  With patient's poor nutritional status and poor microcirculation she has an increased risk of wound not healing and surgical complications.  Patient and family state they understand and wish to proceed with surgery at this time.  I will plan for surgery on Wednesday.  Thank you for the consult and the opportunity to see Ms. Lacara Dunsworth, MD [facility] [phone] 10:27 AM

## 2023-06-25 LAB — GLUCOSE, CAPILLARY: Glucose-Capillary: 109 mg/dL — ABNORMAL HIGH (ref 70–99)

## 2023-06-25 MED ORDER — SENNOSIDES-DOCUSATE SODIUM 8.6-50 MG PO TABS
1.0000 | ORAL_TABLET | Freq: Two times a day (BID) | ORAL | Status: DC
  Administered 2023-06-25 – 2023-07-04 (×16): 1 via ORAL
  Filled 2023-06-25 (×17): qty 1

## 2023-06-25 NOTE — Progress Notes (Signed)
 [facility] ROUNDING NOTE Katherine Archer FMW:[id number]  DOB: 04-13-1955  DOA: 06/16/2023  PCP: [name], MD  06/25/2023,2:20 PM  LOS: 9 days    Code Status: Full code   from: CIR current Dispo: Unclear if   68 year old female with known HTN HLD PAD on Eliquis  Prior smoker until 2022 Right femoropopliteal EGD 05/12/2023 discharged 05/15/2023--complicated by wound dehiscence sepsis 5/25-underwent reexposure R CFA ligation right accessory lobe bifemoral bypass graft excision of right CFA excision right femoral to above knee popliteal bypass etc. 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  Patient was admitted to rehab on 6/4 and in the process of rehab at [facility] on routine follow-up there had increased drainage and fibrinous exudate and it was felt that I&D of groin and redo right AKA site would be needed Patient was discharged back to acute care  6/13 right groin infection had an I&D and placement of 38 centimeter squared Kerecis graft with wound VAC and redo right above-knee amputation 6/18 removal of right axillofemoral and femorofemoral bypass   Plan  Nonhealing right AKA infected groin bypass removed 6/18 Treating presumed underlying MRSA from wound culture 5/26 Duration vancomycin  as per vascular-poor healing of right AKA--Patient has consented for  Dr. Harden to perform Hip disarticulation 06/28/23--family on board--- they are requesting a discussion of what the operation would look like from Dr. Harden Pain controlled on Tylenol  650 every 6 as needed moderate pain Oxy IR 5 every 6 as needed severe pain Dilaudid  1 mg every 3 as needed breakthrough--required IV Dilaudid  earlier today Continue chronic OxyContin  10 at bedtime--gabapentin  300 twice daily surgeries Aspirin  on hold at this time resume after all procedures  HTN HLD Continue amlodipine  10 hydralazine  25-- added toprol  xl 12.5-pressures are better controlled  Anemia acute blood loss from surgery-transfused perioperative 618 Hemoglobin  apporp Go to less frequent lab draw  Goals of care She is DNR but has elected to undergo disarticulation   DVT prophylaxis: Heparin   Status is: Inpatient Remains inpatient appropriate because:   Awaiting stability    Subjective:  Moderate pain at times needed IV Dilaudid  No stools so we will be adding senna  Objective + exam Vitals:   06/25/23 0213 06/25/23 0500 06/25/23 0738 06/25/23 1214  BP: (!) 125/43  (!) 132/47 (!) 127/45  Pulse: 72 62 73 62  Resp: 20 (!) 21 (!) 21 19  Temp: 99 F (37.2 C)  97.6 F (36.4 C) 97.9 F (36.6 C)  TempSrc: Oral  Oral Oral  SpO2: 100% 100% 100% 100%  Weight:  68.3 kg    Height:       Filed Weights   06/23/23 0500 06/24/23 0504 06/25/23 0500  Weight: 63.7 kg 68.1 kg 68.3 kg    Examination:  EOMI NCAT no focal deficit no icterus no pallor Postop changes to chest on both sides S1-S2 no murmur seems to be sinus Abdomen soft-- staples in lower quadrant wound VAC in place Re-inforced bandage on RLE stump not disturbed today   Data Reviewed: reviewed   CBC    Component Value Date/Time   WBC 12.5 (H) 06/24/2023 0518   RBC 3.30 (L) 06/24/2023 0518   HGB 9.8 (L) 06/24/2023 0518   HGB 14.2 03/23/2021 0928   HCT 30.0 (L) 06/24/2023 0518   HCT 42.1 03/23/2021 0928   PLT 334 06/24/2023 0518   PLT 135 (L) 03/23/2021 0928   MCV 90.9 06/24/2023 0518   MCV 93 03/23/2021 0928   MCH 29.7 06/24/2023 0518  MCHC 32.7 06/24/2023 0518   RDW 15.3 06/24/2023 0518   RDW 12.0 03/23/2021 0928   LYMPHSABS 1.8 06/24/2023 0518   LYMPHSABS 1.9 03/23/2021 0928   MONOABS 1.4 (H) 06/24/2023 0518   EOSABS 0.3 06/24/2023 0518   EOSABS 0.3 03/23/2021 0928   BASOSABS 0.1 06/24/2023 0518   BASOSABS 0.0 03/23/2021 0928      Latest Ref Rng & Units 06/24/2023    5:18 AM 06/23/2023    4:48 AM 06/22/2023    6:15 AM  CMP  Glucose 70 - 99 mg/dL 896  97  890   BUN 8 - 23 mg/dL 6  8  6    Creatinine 0.44 - 1.00 mg/dL 9.50  9.59  9.60   Sodium 135 - 145  mmol/L 132  132  135   Potassium 3.5 - 5.1 mmol/L 3.7  3.8  3.7   Chloride 98 - 111 mmol/L 100  99  101   CO2 22 - 32 mmol/L 25  26  24    Calcium  8.9 - 10.3 mg/dL 8.4  8.7  8.8     Scheduled Meds:  amLODipine   10 mg Oral Daily   ascorbic acid   250 mg Oral BID   Chlorhexidine  Gluconate Cloth  6 each Topical Daily   docusate sodium   50 mg Oral Daily   feeding supplement  237 mL Oral BID BM   gabapentin   300 mg Oral BID   heparin   5,000 Units Subcutaneous Q8H   hydrALAZINE   25 mg Oral Q8H   metoprolol  succinate  12.5 mg Oral Daily   multivitamin with minerals  1 tablet Oral Daily   oxyCODONE   10 mg Oral QHS   polyethylene glycol  17 g Oral Daily   sodium chloride  flush  3 mL Intravenous Q12H   Continuous Infusions:  vancomycin  750 mg (06/25/23 0541)    Time 20  Colen Grimes, MD  [facility]

## 2023-06-25 NOTE — Progress Notes (Addendum)
  Progress Note    06/25/2023 8:51 AM 4 Days Post-Op  Subjective:  no complaints   Vitals:   06/25/23 0500 06/25/23 0738  BP:  (!) 132/47  Pulse: 62 73  Resp: (!) 21 (!) 21  Temp:  97.6 F (36.4 C)  SpO2: 100% 100%   Physical Exam: Lungs:  non labored Incisions:  R groin vac with good seal Extremities:  R AKA bandage left in place Neurologic: A&O  CBC    Component Value Date/Time   WBC 12.5 (H) 06/24/2023 0518   RBC 3.30 (L) 06/24/2023 0518   HGB 9.8 (L) 06/24/2023 0518   HGB 14.2 03/23/2021 0928   HCT 30.0 (L) 06/24/2023 0518   HCT 42.1 03/23/2021 0928   PLT 334 06/24/2023 0518   PLT 135 (L) 03/23/2021 0928   MCV 90.9 06/24/2023 0518   MCV 93 03/23/2021 0928   MCH 29.7 06/24/2023 0518   MCHC 32.7 06/24/2023 0518   RDW 15.3 06/24/2023 0518   RDW 12.0 03/23/2021 0928   LYMPHSABS 1.8 06/24/2023 0518   LYMPHSABS 1.9 03/23/2021 0928   MONOABS 1.4 (H) 06/24/2023 0518   EOSABS 0.3 06/24/2023 0518   EOSABS 0.3 03/23/2021 0928   BASOSABS 0.1 06/24/2023 0518   BASOSABS 0.0 03/23/2021 0928    BMET    Component Value Date/Time   NA 132 (L) 06/24/2023 0518   NA 138 01/15/2020 1128   K 3.7 06/24/2023 0518   CL 100 06/24/2023 0518   CO2 25 06/24/2023 0518   GLUCOSE 103 (H) 06/24/2023 0518   BUN 6 (L) 06/24/2023 0518   BUN 8 01/15/2020 1128   CREATININE 0.49 06/24/2023 0518   CALCIUM  8.4 (L) 06/24/2023 0518   GFRNONAA >60 06/24/2023 0518   GFRAA 80 01/15/2020 1128    INR    Component Value Date/Time   INR 1.5 (H) 05/29/2023 0030     Intake/Output Summary (Last 24 hours) at 06/25/2023 0851 Last data filed at 06/24/2023 2307 Gross per 24 hour  Intake --  Output 2350 ml  Net -2350 ml     Assessment/Plan:  68 y.o. female is s/p infected ax-bifemoral bypass 4 Days Post-Op   L foot warm and well perfused R AKA failing; plans noted for hip disarticulation with Dr. Harden Wednesday; family and patient in agreement Wound vac can be changed at the time of  surgery   Donnice Sender, PA-C [facility] [phone] 06/25/2023 8:51 AM  I have independently interviewed and examined patient and agree with PA assessment and plan above.  Left axillary and groin wounds healing well left foot is warm and well-perfused.  Plan for right hip disarticulation with Dr. Harden on Wednesday patient is in agreement.  Christen Wardrop C. Sheree, MD [facility] [place] Office: [phone] Pager: [phone]

## 2023-06-26 NOTE — Progress Notes (Signed)
 TR[facility] ROUNDING NOTE Katherine Archer FMW:[id number]  DOB: 1955/10/24  DOA: 06/16/2023  PCP: [name], MD  06/26/2023,4:34 PM  LOS: 10 days    Code Status: Full code   from: CIR current Dispo: Unclear if   68 year old female with known HTN HLD PAD on Eliquis  Prior smoker until 2022 Right femoropopliteal EGD 05/12/2023 discharged 05/15/2023--complicated by wound dehiscence sepsis 5/25-underwent reexposure R CFA ligation right accessory lobe bifemoral bypass graft excision of right CFA excision right femoral to above knee popliteal bypass etc. 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  Patient was admitted to rehab on 6/4 and in the process of rehab at [facility] on routine follow-up there had increased drainage and fibrinous exudate and it was felt that I&D of groin and redo right AKA site would be needed Patient was discharged back to acute care  6/13 right groin infection had an I&D and placement of 38 centimeter squared Kerecis graft with wound VAC and redo right above-knee amputation 6/18 removal of right axillofemoral and femorofemoral bypass   Plan  Nonhealing right AKA infected groin bypass removed 6/18 Treating presumed underlying MRSA from wound culture 5/26 Duration vancomycin  until performance ofHip disarticulation 06/28/23--family on board--- they are requesting a discussion of what the operation would look like from Dr. Harden Pain controlled on Tylenol  650 every 6 as needed moderate pain Oxy IR 5 every 6 as needed severe pain Dilaudid  1 mg every 3 as needed breakthrough-controlled overall Continue chronic OxyContin  10 at bedtime--gabapentin  300 twice daily surgeries Aspirin  on hold at this time resume after all procedures  HTN HLD Continue amlodipine  10 hydralazine  25-- added toprol  xl 12.5-pressures are better controlled  Anemia acute blood loss from surgery-transfused perioperative 618 Hemoglobin apporp Go to less frequent lab draw  Goals of care She is DNR but has elected to  undergo disarticulation   DVT prophylaxis: Heparin   Status is: Inpatient Remains inpatient appropriate because:   Awaiting stability    Subjective:  Seems fair no new changes  Objective + exam Vitals:   06/26/23 0748 06/26/23 1105 06/26/23 1215 06/26/23 1524  BP: (!) 125/55 (!) 116/40 (!) 134/45 (!) 123/44  Pulse: 75 66 74 65  Resp: (!) 24 (!) 21 16 (!) 21  Temp: 98.4 F (36.9 C) 98.4 F (36.9 C) 97.7 F (36.5 C) 97.6 F (36.4 C)  TempSrc: Oral Oral Oral Oral  SpO2: 100% 98% 100% 97%  Weight:      Height:       Filed Weights   06/24/23 0504 06/25/23 0500 06/26/23 0320  Weight: 68.1 kg 68.3 kg 69.2 kg    Examination:  EOMI NCAT no focal deficit no icterus no pallor Postop changes to chest on both sides S1-S2 no murmur seems to be sinus Abdomen soft-- staples in lower quadrant wound VAC in place no new changes RLE stump not disturbed today at patient request   Data Reviewed: reviewed   CBC    Component Value Date/Time   WBC 12.5 (H) 06/24/2023 0518   RBC 3.30 (L) 06/24/2023 0518   HGB 9.8 (L) 06/24/2023 0518   HGB 14.2 03/23/2021 0928   HCT 30.0 (L) 06/24/2023 0518   HCT 42.1 03/23/2021 0928   PLT 334 06/24/2023 0518   PLT 135 (L) 03/23/2021 0928   MCV 90.9 06/24/2023 0518   MCV 93 03/23/2021 0928   MCH 29.7 06/24/2023 0518   MCHC 32.7 06/24/2023 0518   RDW 15.3 06/24/2023 0518   RDW 12.0 03/23/2021 0928   LYMPHSABS  1.8 06/24/2023 0518   LYMPHSABS 1.9 03/23/2021 0928   MONOABS 1.4 (H) 06/24/2023 0518   EOSABS 0.3 06/24/2023 0518   EOSABS 0.3 03/23/2021 0928   BASOSABS 0.1 06/24/2023 0518   BASOSABS 0.0 03/23/2021 0928      Latest Ref Rng & Units 06/24/2023    5:18 AM 06/23/2023    4:48 AM 06/22/2023    6:15 AM  CMP  Glucose 70 - 99 mg/dL 896  97  890   BUN 8 - 23 mg/dL 6  8  6    Creatinine 0.44 - 1.00 mg/dL 9.50  9.59  9.60   Sodium 135 - 145 mmol/L 132  132  135   Potassium 3.5 - 5.1 mmol/L 3.7  3.8  3.7   Chloride 98 - 111 mmol/L 100  99   101   CO2 22 - 32 mmol/L 25  26  24    Calcium  8.9 - 10.3 mg/dL 8.4  8.7  8.8     Scheduled Meds:  amLODipine   10 mg Oral Daily   ascorbic acid   250 mg Oral BID   Chlorhexidine  Gluconate Cloth  6 each Topical Daily   feeding supplement  237 mL Oral BID BM   gabapentin   300 mg Oral BID   heparin   5,000 Units Subcutaneous Q8H   hydrALAZINE   25 mg Oral Q8H   metoprolol  succinate  12.5 mg Oral Daily   multivitamin with minerals  1 tablet Oral Daily   oxyCODONE   10 mg Oral QHS   polyethylene glycol  17 g Oral Daily   senna-docusate  1 tablet Oral BID   sodium chloride  flush  3 mL Intravenous Q12H   Continuous Infusions:  vancomycin  750 mg (06/26/23 1536)    Time 20  Colen Grimes, MD  [facility]

## 2023-06-26 NOTE — Progress Notes (Signed)
  Progress Note    06/26/2023 7:54 AM 5 Days Post-Op  Subjective:  no major complaints   Vitals:   06/26/23 0320 06/26/23 0748  BP: (!) 124/46 (!) 125/55  Pulse: 64 75  Resp: 20 (!) 24  Temp: 99.7 F (37.6 C) 98.4 F (36.9 C)  SpO2: 98% 100%   Physical Exam: Cardiac:  regular Lungs:  non labored Incisions:  right groin with VAC to suction, left infraclavicular, flank, and left groin incisions are clean. Dry and intact. No swelling or hematoma Extremities:  right AKA wound with wet to dry dressings in place, ss drainage Neurologic: alert and oriented   CBC    Component Value Date/Time   WBC 12.5 (H) 06/24/2023 0518   RBC 3.30 (L) 06/24/2023 0518   HGB 9.8 (L) 06/24/2023 0518   HGB 14.2 03/23/2021 0928   HCT 30.0 (L) 06/24/2023 0518   HCT 42.1 03/23/2021 0928   PLT 334 06/24/2023 0518   PLT 135 (L) 03/23/2021 0928   MCV 90.9 06/24/2023 0518   MCV 93 03/23/2021 0928   MCH 29.7 06/24/2023 0518   MCHC 32.7 06/24/2023 0518   RDW 15.3 06/24/2023 0518   RDW 12.0 03/23/2021 0928   LYMPHSABS 1.8 06/24/2023 0518   LYMPHSABS 1.9 03/23/2021 0928   MONOABS 1.4 (H) 06/24/2023 0518   EOSABS 0.3 06/24/2023 0518   EOSABS 0.3 03/23/2021 0928   BASOSABS 0.1 06/24/2023 0518   BASOSABS 0.0 03/23/2021 0928    BMET    Component Value Date/Time   NA 132 (L) 06/24/2023 0518   NA 138 01/15/2020 1128   K 3.7 06/24/2023 0518   CL 100 06/24/2023 0518   CO2 25 06/24/2023 0518   GLUCOSE 103 (H) 06/24/2023 0518   BUN 6 (L) 06/24/2023 0518   BUN 8 01/15/2020 1128   CREATININE 0.49 06/24/2023 0518   CALCIUM  8.4 (L) 06/24/2023 0518   GFRNONAA >60 06/24/2023 0518   GFRAA 80 01/15/2020 1128    INR    Component Value Date/Time   INR 1.5 (H) 05/29/2023 0030     Intake/Output Summary (Last 24 hours) at 06/26/2023 0754 Last data filed at 06/26/2023 0754 Gross per 24 hour  Intake 150 ml  Output 7335 ml  Net -7185 ml     Assessment/Plan:  68 y.o. female is s/p infected  ax-bifemoral bypass  Left leg remains well perfused and warm Incisions are intact and well appearing Right Groin with VAC to suction. To be changed at time of OR Remains on VAC Right AKA with wet to dry dressings in place Plan is for right hip disarticulation with Dr. Harden on Wednesday 6/25   Teretha Damme, PA-C [facility] [phone] 06/26/2023 7:54 AM

## 2023-06-27 LAB — BASIC METABOLIC PANEL WITH GFR
Anion gap: 7 (ref 5–15)
BUN: 10 mg/dL (ref 8–23)
CO2: 27 mmol/L (ref 22–32)
Calcium: 8.6 mg/dL — ABNORMAL LOW (ref 8.9–10.3)
Chloride: 99 mmol/L (ref 98–111)
Creatinine, Ser: 0.43 mg/dL — ABNORMAL LOW (ref 0.44–1.00)
GFR, Estimated: >60 mL/min (ref >60)
Glucose, Bld: 91 mg/dL (ref 70–99)
Potassium: 4 mmol/L (ref 3.5–5.1)
Sodium: 133 mmol/L — ABNORMAL LOW (ref 135–145)

## 2023-06-27 LAB — CBC WITH DIFFERENTIAL/PLATELET
Abs Immature Granulocytes: 0.55 10*3/uL — ABNORMAL HIGH (ref 0.00–0.07)
Basophils Absolute: 0.1 10*3/uL (ref 0.0–0.1)
Basophils Relative: 0 %
Eosinophils Absolute: 0.3 10*3/uL (ref 0.0–0.5)
Eosinophils Relative: 2 %
HCT: 29.3 % — ABNORMAL LOW (ref 36.0–46.0)
Hemoglobin: 9.4 g/dL — ABNORMAL LOW (ref 12.0–15.0)
Immature Granulocytes: 4 %
Lymphocytes Relative: 14 %
Lymphs Abs: 2 10*3/uL (ref 0.7–4.0)
MCH: 29.9 pg (ref 26.0–34.0)
MCHC: 32.1 g/dL (ref 30.0–36.0)
MCV: 93.3 fL (ref 80.0–100.0)
Monocytes Absolute: 1.1 10*3/uL — ABNORMAL HIGH (ref 0.1–1.0)
Monocytes Relative: 7 %
Neutro Abs: 10.3 10*3/uL — ABNORMAL HIGH (ref 1.7–7.7)
Neutrophils Relative %: 73 %
Platelets: 355 10*3/uL (ref 150–400)
RBC: 3.14 MIL/uL — ABNORMAL LOW (ref 3.87–5.11)
RDW: 15.2 % (ref 11.5–15.5)
WBC: 14.3 10*3/uL — ABNORMAL HIGH (ref 4.0–10.5)
nRBC: 0 % (ref 0.0–0.2)

## 2023-06-27 LAB — MRSA NEXT GEN BY PCR, NASAL: MRSA by PCR Next Gen: NOT DETECTED

## 2023-06-27 MED ORDER — OXYCODONE HCL 5 MG PO TABS
5.0000 mg | ORAL_TABLET | Freq: Four times a day (QID) | ORAL | Status: DC | PRN
  Administered 2023-06-27 – 2023-06-29 (×3): 10 mg via ORAL
  Filled 2023-06-27 (×3): qty 2

## 2023-06-27 MED ORDER — POVIDONE-IODINE 10 % EX SWAB: 2.0000 | Freq: Once | CUTANEOUS | Status: DC

## 2023-06-27 MED ORDER — ENSURE PRE-SURGERY PO LIQD
296.0000 mL | Freq: Once | ORAL | Status: AC
  Administered 2023-06-27: 296 mL via ORAL
  Filled 2023-06-27: qty 296

## 2023-06-27 MED ORDER — CEFAZOLIN SODIUM-DEXTROSE 2-4 GM/100ML-% IV SOLN
2.0000 g | INTRAVENOUS | Status: AC
  Administered 2023-06-28: 2 g via INTRAVENOUS
  Filled 2023-06-27: qty 100

## 2023-06-27 MED ORDER — ALTEPLASE 2 MG IJ SOLR
2.0000 mg | Freq: Once | INTRAMUSCULAR | Status: DC
  Filled 2023-06-27: qty 2

## 2023-06-27 MED ORDER — CHLORHEXIDINE GLUCONATE 4 % EX SOLN
60.0000 mL | Freq: Once | CUTANEOUS | Status: AC
  Administered 2023-06-28: 4 via TOPICAL
  Filled 2023-06-27: qty 60

## 2023-06-27 NOTE — Progress Notes (Addendum)
 TR[facility] ROUNDING NOTE Katherine Archer FMW:[id number]  DOB: [birth year]  DOA: 06/16/2023  PCP: [name], MD  06/27/2023,2:51 PM  LOS: 11 days    Code Status: Full code   from: CIR current Dispo: Unclear if   68 year old female with known HTN HLD PAD on Eliquis  Prior smoker until 2022 Right femoropopliteal EGD 05/12/2023 discharged 05/15/2023--complicated by wound dehiscence sepsis 5/25-underwent reexposure R CFA ligation right accessory lobe bifemoral bypass graft excision of right CFA excision right femoral to above knee popliteal bypass etc. 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  Patient was admitted to rehab on 6/4 and in the process of rehab at [facility] on routine follow-up there had increased drainage and fibrinous exudate and it was felt that I&D of groin and redo right AKA site would be needed Patient was discharged back to acute care  6/13 right groin infection had an I&D and placement of 38 centimeter squared Kerecis graft with wound VAC and redo right above-knee amputation 6/18 removal of right axillofemoral and femorofemoral bypass   Plan  Nonhealing right AKA infected groin bypass removed 6/18 Treating presumed underlying MRSA from wound culture 5/26 Duration vancomycin  Hip disarticulation 06/28/23--family on board--- they have discussed with Dr. Harden team Continues Tylenol  650 every 6 -increase  Oxy IR 5 -10 every 6  as is still requiring 3-4 times a day IV Dilaudid  Continue chronic OxyContin  10 at bedtime--gabapentin  300 twice daily  Aspirin  on hold at this time resume after all procedures  HTN HLD Continue amlodipine  10 hydralazine  25-- added toprol  xl 12.5-pressures are better   Anemia acute blood loss from surgery-transfused perioperative 6/18 Hemoglobin apporp Repeat labs in a.m.  Goals of care She is DNR but has elected to undergo disarticulation   Updated daughter [name] and aware of surgery and aware of what it might entail  DVT prophylaxis: Heparin   Status is:  Inpatient Remains inpatient appropriate because:   Awaiting stability    Subjective:  No distress seems comfortable  Objective + exam Vitals:   06/27/23 0841 06/27/23 1132 06/27/23 1139 06/27/23 1403  BP: (!) 140/63 (!) 122/51  (!) 135/48  Pulse: 71 69 64   Resp:  16  (!) 21  Temp: 97.7 F (36.5 C) 98.4 F (36.9 C)    TempSrc: Oral Oral    SpO2:  92% 100%   Weight:      Height:       Filed Weights   06/24/23 0504 06/25/23 0500 06/26/23 0320  Weight: 68.1 kg 68.3 kg 69.2 kg    Examination:  EOMI NCAT no focal deficit no icterus no pallor Postop changes to chest on both sides S1-S2 no murmur seems to be sinus Abdomen soft-- staples in lower quadrant wound VAC in place no new changes RLE stump not disturbed today at patient request   Data Reviewed: reviewed   CBC    Component Value Date/Time   WBC 14.3 (H) 06/27/2023 0830   RBC 3.14 (L) 06/27/2023 0830   HGB 9.4 (L) 06/27/2023 0830   HGB 14.2 03/23/2021 0928   HCT 29.3 (L) 06/27/2023 0830   HCT 42.1 03/23/2021 0928   PLT 355 06/27/2023 0830   PLT 135 (L) 03/23/2021 0928   MCV 93.3 06/27/2023 0830   MCV 93 03/23/2021 0928   MCH 29.9 06/27/2023 0830   MCHC 32.1 06/27/2023 0830   RDW 15.2 06/27/2023 0830   RDW 12.0 03/23/2021 0928   LYMPHSABS 2.0 06/27/2023 0830   LYMPHSABS 1.9 03/23/2021 0928   MONOABS  1.1 (H) 06/27/2023 0830   EOSABS 0.3 06/27/2023 0830   EOSABS 0.3 03/23/2021 0928   BASOSABS 0.1 06/27/2023 0830   BASOSABS 0.0 03/23/2021 0928      Latest Ref Rng & Units 06/27/2023    8:30 AM 06/24/2023    5:18 AM 06/23/2023    4:48 AM  CMP  Glucose 70 - 99 mg/dL 91  896  97   BUN 8 - 23 mg/dL 10  6  8    Creatinine 0.44 - 1.00 mg/dL 9.56  9.50  9.59   Sodium 135 - 145 mmol/L 133  132  132   Potassium 3.5 - 5.1 mmol/L 4.0  3.7  3.8   Chloride 98 - 111 mmol/L 99  100  99   CO2 22 - 32 mmol/L 27  25  26    Calcium  8.9 - 10.3 mg/dL 8.6  8.4  8.7     Scheduled Meds:  alteplase  2 mg Intracatheter  Once   amLODipine   10 mg Oral Daily   ascorbic acid   250 mg Oral BID   chlorhexidine   60 mL Topical Once   Chlorhexidine  Gluconate Cloth  6 each Topical Daily   feeding supplement  237 mL Oral BID BM   gabapentin   300 mg Oral BID   heparin   5,000 Units Subcutaneous Q8H   hydrALAZINE   25 mg Oral Q8H   metoprolol  succinate  12.5 mg Oral Daily   multivitamin with minerals  1 tablet Oral Daily   oxyCODONE   10 mg Oral QHS   polyethylene glycol  17 g Oral Daily   povidone-iodine   2 Application Topical Once   senna-docusate  1 tablet Oral BID   sodium chloride  flush  3 mL Intravenous Q12H   Continuous Infusions:  [START ON 06/28/2023]  ceFAZolin  (ANCEF ) IV     vancomycin  750 mg (06/27/23 0526)    Time 20  Colen Grimes, MD  [facility]

## 2023-06-27 NOTE — Progress Notes (Signed)
 Patients family member believes she has received her last heparin  injection too soon to receive another. I explained patient is scheduled to receive heparin  every 8 hours, however the heparin  was still refused.

## 2023-06-27 NOTE — Anesthesia Preprocedure Evaluation (Signed)
 Anesthesia Evaluation  Patient identified by MRN, date of birth, ID band Patient awake    Reviewed: Allergy & Precautions, NPO status , Patient's Chart, lab work & pertinent test results  History of Anesthesia Complications Negative for: history of anesthetic complications  Airway Mallampati: III  TM Distance: >3 FB Neck ROM: Full   Comment: Previous grade I view with MAC 3, easy mask Dental  (+) Dental Advisory Given, Edentulous Upper, Edentulous Lower   Pulmonary neg shortness of breath, neg sleep apnea, neg COPD, neg recent URI, former smoker   Pulmonary exam normal breath sounds clear to auscultation       Cardiovascular hypertension, Pt. 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, left-sided weakness), Residual Symptoms    GI/Hepatic negative GI ROS, Neg liver ROS,,,  Endo/Other  negative endocrine ROS    Renal/GU negative Renal ROS     Musculoskeletal   Abdominal   Peds  Hematology  (+) Blood dyscrasia, anemia Lab Results      Component                Value               Date                      WBC                      14.3 (H)            06/27/2023                HGB                      9.4 (L)             06/27/2023                HCT                      29.3 (L)            06/27/2023                MCV                      93.3                06/27/2023                PLT                      355                 06/27/2023              Anesthesia Other Findings   Reproductive/Obstetrics                             Anesthesia Physical Anesthesia Plan  ASA: 4  Anesthesia Plan: General   Post-op Pain Management: Tylenol  PO (pre-op)*   Induction:  Intravenous  PONV Risk Score and Plan: 3 and Ondansetron , Dexamethasone  and Treatment may vary due to age or medical condition  Airway Management Planned: Oral ETT  Additional Equipment:   Intra-op Plan:   Post-operative Plan: Extubation in OR  Informed Consent: I have reviewed the patients History and Physical, chart, labs and discussed the procedure including the risks, benefits and alternatives for the proposed anesthesia with the patient or authorized representative who has indicated his/her understanding and acceptance.   Patient has DNR.  Discussed DNR with patient and Suspend DNR.   Dental advisory given  Plan Discussed with: CRNA and Anesthesiologist  Anesthesia Plan Comments: (Risks of general anesthesia discussed including, but not limited to, sore throat, hoarse voice, chipped/damaged teeth, injury to vocal cords, nausea and vomiting, allergic reactions, lung infection, heart attack, stroke, and death. All questions answered.  Patient signed waiver to interpreter and had family member interpreting. )       Anesthesia Quick Evaluation

## 2023-06-27 NOTE — Progress Notes (Signed)
    Subjective  -   no complaints.  Not complaining of pain this morning   Physical Exam:  Bilateral infraclavicular incisions well-healed.  The left groin incision is well-healed.  Wound VAC remains on the right groin.  The left leg is warm well-perfused.       Assessment/Plan:    Appreciate Dr. Crist assistance.  The patient has been unable to heal a high above-knee amputation secondary to bypass graft removal and an adequate blood supply.  Her last option is a hip disarticulation.  Daughter and patient are in agreement to proceed.  Katherine Archer 06/27/2023 9:50 AM --  Vitals:   06/27/23 0814 06/27/23 0841  BP: (!) 124/44 (!) 140/63  Pulse: 69 71  Resp:    Temp:  97.7 F (36.5 C)  SpO2:      Intake/Output Summary (Last 24 hours) at 06/27/2023 0950 Last data filed at 06/27/2023 0340 Gross per 24 hour  Intake --  Output 2350 ml  Net -2350 ml     Laboratory CBC    Component Value Date/Time   WBC 14.3 (H) 06/27/2023 0830   HGB 9.4 (L) 06/27/2023 0830   HGB 14.2 03/23/2021 0928   HCT 29.3 (L) 06/27/2023 0830   HCT 42.1 03/23/2021 0928   PLT 355 06/27/2023 0830   PLT 135 (L) 03/23/2021 0928    BMET    Component Value Date/Time   NA 132 (L) 06/24/2023 0518   NA 138 01/15/2020 1128   K 3.7 06/24/2023 0518   CL 100 06/24/2023 0518   CO2 25 06/24/2023 0518   GLUCOSE 103 (H) 06/24/2023 0518   BUN 6 (L) 06/24/2023 0518   BUN 8 01/15/2020 1128   CREATININE 0.49 06/24/2023 0518   CALCIUM  8.4 (L) 06/24/2023 0518   GFRNONAA >60 06/24/2023 0518   GFRAA 80 01/15/2020 1128    COAG Lab Results  Component Value Date   INR 1.5 (H) 05/29/2023   INR 1.1 05/11/2023   INR 1.0 01/24/2022   No results found for: PTT  Antibiotics Anti-infectives (From admission, onward)    Start     Dose/Rate Route Frequency Ordered Stop   06/25/23 0500  vancomycin  (VANCOREADY) IVPB 750 mg/150 mL        750 mg 150 mL/hr over 60 Minutes Intravenous Every 12 hours 06/24/23  1204     06/24/23 1000  vancomycin  (VANCOREADY) IVPB 1250 mg/250 mL  Status:  Discontinued        1,250 mg 166.7 mL/hr over 90 Minutes Intravenous Every 24 hours 06/23/23 1423 06/24/23 1204   06/23/23 2200  linezolid  (ZYVOX ) tablet 600 mg  Status:  Discontinued        600 mg Oral Every 12 hours 06/23/23 1321 06/23/23 1423   06/18/23 1000  vancomycin  (VANCOREADY) IVPB 1250 mg/250 mL  Status:  Discontinued        1,250 mg 166.7 mL/hr over 90 Minutes Intravenous Every 24 hours 06/17/23 2249 06/23/23 1321   06/17/23 1000  Vancomycin  (VANCOCIN ) 1,250 mg in sodium chloride  0.9 % 250 mL IVPB  Status:  Discontinued        1,250 mg 166.7 mL/hr over 90 Minutes Intravenous Every 24 hours 06/16/23 1718 06/17/23 2248        V. Malvina Serene CLORE, M.D., [facility] [place] Office: [phone] Pager:  [phone]

## 2023-06-27 NOTE — Progress Notes (Signed)
 [facility]    A/P CLI non healing right AKA NPO past MN Plan for right hip disarticulation with Dr. Harden 06/28/23 Consent order placed.  Maurilio Deland Collet PA-C

## 2023-06-28 ENCOUNTER — Other Ambulatory Visit: Payer: Self-pay

## 2023-06-28 ENCOUNTER — Inpatient Hospital Stay (HOSPITAL_COMMUNITY)

## 2023-06-28 ENCOUNTER — Encounter (HOSPITAL_COMMUNITY)
Admission: EM | Disposition: A | Discharge status: Rehabilitation Facility | Payer: Self-pay | Attending: Family Medicine

## 2023-06-28 ENCOUNTER — Inpatient Hospital Stay (HOSPITAL_COMMUNITY): Payer: Self-pay | Admitting: Registered Nurse

## 2023-06-28 ENCOUNTER — Encounter (HOSPITAL_COMMUNITY): Payer: Self-pay | Admitting: Internal Medicine

## 2023-06-28 ENCOUNTER — Other Ambulatory Visit (HOSPITAL_COMMUNITY): Payer: Self-pay

## 2023-06-28 DIAGNOSIS — E785 Hyperlipidemia, unspecified: Secondary | ICD-10-CM

## 2023-06-28 DIAGNOSIS — T8753 Necrosis of amputation stump, right lower extremity: Secondary | ICD-10-CM

## 2023-06-28 DIAGNOSIS — I2510 Atherosclerotic heart disease of native coronary artery without angina pectoris: Secondary | ICD-10-CM

## 2023-06-28 DIAGNOSIS — I10 Essential (primary) hypertension: Secondary | ICD-10-CM

## 2023-06-28 HISTORY — PX: HIP DISARTICULATION: SHX5851

## 2023-06-28 LAB — BASIC METABOLIC PANEL WITH GFR
Anion gap: 7 (ref 5–15)
BUN: 10 mg/dL (ref 8–23)
CO2: 26 mmol/L (ref 22–32)
Calcium: 8.8 mg/dL — ABNORMAL LOW (ref 8.9–10.3)
Chloride: 100 mmol/L (ref 98–111)
Creatinine, Ser: 0.5 mg/dL (ref 0.44–1.00)
GFR, Estimated: >60 mL/min (ref >60)
Glucose, Bld: 101 mg/dL — ABNORMAL HIGH (ref 70–99)
Potassium: 4 mmol/L (ref 3.5–5.1)
Sodium: 133 mmol/L — ABNORMAL LOW (ref 135–145)

## 2023-06-28 LAB — CBC WITH DIFFERENTIAL/PLATELET
Abs Immature Granulocytes: 0.46 10*3/uL — ABNORMAL HIGH (ref 0.00–0.07)
Basophils Absolute: 0.1 10*3/uL (ref 0.0–0.1)
Basophils Relative: 1 %
Eosinophils Absolute: 0.2 10*3/uL (ref 0.0–0.5)
Eosinophils Relative: 2 %
HCT: 31.1 % — ABNORMAL LOW (ref 36.0–46.0)
Hemoglobin: 10.1 g/dL — ABNORMAL LOW (ref 12.0–15.0)
Immature Granulocytes: 5 %
Lymphocytes Relative: 15 %
Lymphs Abs: 1.5 10*3/uL (ref 0.7–4.0)
MCH: 29.4 pg (ref 26.0–34.0)
MCHC: 32.5 g/dL (ref 30.0–36.0)
MCV: 90.7 fL (ref 80.0–100.0)
Monocytes Absolute: 0.7 10*3/uL (ref 0.1–1.0)
Monocytes Relative: 7 %
Neutro Abs: 7.4 10*3/uL (ref 1.7–7.7)
Neutrophils Relative %: 70 %
Platelets: 309 10*3/uL (ref 150–400)
RBC: 3.43 MIL/uL — ABNORMAL LOW (ref 3.87–5.11)
RDW: 15.3 % (ref 11.5–15.5)
WBC: 10.3 10*3/uL (ref 4.0–10.5)
nRBC: 0 % (ref 0.0–0.2)

## 2023-06-28 LAB — TYPE AND SCREEN
ABO/RH(D): O POS
Antibody Screen: NEGATIVE

## 2023-06-28 LAB — PREPARE RBC (CROSSMATCH)

## 2023-06-28 SURGERY — DISARTICULATION, HIP
Anesthesia: General | Site: Hip | Laterality: Right

## 2023-06-28 MED ORDER — HYDROMORPHONE HCL 1 MG/ML IJ SOLN
INTRAMUSCULAR | Status: AC
  Filled 2023-06-28: qty 1

## 2023-06-28 MED ORDER — OXYCODONE HCL 5 MG/5ML PO SOLN
ORAL | Status: AC
  Filled 2023-06-28: qty 5

## 2023-06-28 MED ORDER — SODIUM CHLORIDE 0.9 % IR SOLN
Status: DC | PRN
  Administered 2023-06-28: 1000 mL

## 2023-06-28 MED ORDER — LABETALOL HCL 5 MG/ML IV SOLN: 10.0000 mg | INTRAVENOUS | Status: DC | PRN

## 2023-06-28 MED ORDER — ACETAMINOPHEN 500 MG PO TABS
ORAL_TABLET | ORAL | Status: AC
  Administered 2023-06-28: 1000 mg via ORAL
  Filled 2023-06-28: qty 2

## 2023-06-28 MED ORDER — FENTANYL CITRATE (PF) 250 MCG/5ML IJ SOLN
INTRAMUSCULAR | Status: AC
  Filled 2023-06-28: qty 5

## 2023-06-28 MED ORDER — SUGAMMADEX SODIUM 200 MG/2ML IV SOLN
INTRAVENOUS | Status: DC | PRN
  Administered 2023-06-28: 200 mg via INTRAVENOUS

## 2023-06-28 MED ORDER — CHLORHEXIDINE GLUCONATE 0.12 % MT SOLN
OROMUCOSAL | Status: AC
  Administered 2023-06-28: 15 mL via OROMUCOSAL
  Filled 2023-06-28: qty 15

## 2023-06-28 MED ORDER — VASHE WOUND IRRIGATION OPTIME
TOPICAL | Status: DC | PRN
  Administered 2023-06-28: 34 [oz_av]

## 2023-06-28 MED ORDER — PROPOFOL 10 MG/ML IV BOLUS
INTRAVENOUS | Status: AC
  Filled 2023-06-28: qty 20

## 2023-06-28 MED ORDER — LIDOCAINE 2% (20 MG/ML) 5 ML SYRINGE
INTRAMUSCULAR | Status: DC | PRN
  Administered 2023-06-28: 80 mg via INTRAVENOUS

## 2023-06-28 MED ORDER — SUGAMMADEX SODIUM 200 MG/2ML IV SOLN: INTRAVENOUS | Status: DC | PRN

## 2023-06-28 MED ORDER — SODIUM CHLORIDE (PF) 0.9 % IJ SOLN
INTRAMUSCULAR | Status: AC
  Filled 2023-06-28: qty 10

## 2023-06-28 MED ORDER — DEXAMETHASONE SODIUM PHOSPHATE 10 MG/ML IJ SOLN
INTRAMUSCULAR | Status: AC
  Filled 2023-06-28: qty 1

## 2023-06-28 MED ORDER — HYDROMORPHONE HCL 1 MG/ML IJ SOLN
1.0000 mg | Freq: Once | INTRAMUSCULAR | Status: AC
  Administered 2023-06-28: 1 mg via INTRAVENOUS

## 2023-06-28 MED ORDER — AMISULPRIDE (ANTIEMETIC) 5 MG/2ML IV SOLN: 10.0000 mg | Freq: Once | INTRAVENOUS | Status: DC | PRN

## 2023-06-28 MED ORDER — CHLORHEXIDINE GLUCONATE 0.12 % MT SOLN: 15.0000 mL | Freq: Once | OROMUCOSAL | Status: AC

## 2023-06-28 MED ORDER — ROCURONIUM BROMIDE 10 MG/ML (PF) SYRINGE
PREFILLED_SYRINGE | INTRAVENOUS | Status: DC | PRN
  Administered 2023-06-28: 40 mg via INTRAVENOUS

## 2023-06-28 MED ORDER — OXYCODONE HCL 5 MG PO TABS: 5.0000 mg | ORAL_TABLET | Freq: Once | ORAL | Status: AC | PRN

## 2023-06-28 MED ORDER — PHENOL 1.4 % MT LIQD: 1.0000 | OROMUCOSAL | Status: DC | PRN

## 2023-06-28 MED ORDER — SODIUM CHLORIDE 0.9 % IV SOLN: INTRAVENOUS | Status: DC

## 2023-06-28 MED ORDER — METOPROLOL TARTRATE 5 MG/5ML IV SOLN: 2.0000 mg | INTRAVENOUS | Status: DC | PRN

## 2023-06-28 MED ORDER — VANCOMYCIN HCL 1000 MG IV SOLR
INTRAVENOUS | Status: AC
Start: 2023-06-28 — End: 2023-06-28
  Filled 2023-06-28: qty 20

## 2023-06-28 MED ORDER — FLUCONAZOLE 100 MG PO TABS
100.0000 mg | ORAL_TABLET | Freq: Every day | ORAL | Status: DC
  Administered 2023-06-28 – 2023-07-01 (×4): 100 mg via ORAL
  Filled 2023-06-28 (×4): qty 1

## 2023-06-28 MED ORDER — ALUM & MAG HYDROXIDE-SIMETH 200-200-20 MG/5ML PO SUSP: 15.0000 mL | ORAL | Status: DC | PRN

## 2023-06-28 MED ORDER — HYDROMORPHONE HCL 1 MG/ML IJ SOLN
0.2500 mg | INTRAMUSCULAR | Status: DC | PRN
  Administered 2023-06-28 (×4): 0.5 mg via INTRAVENOUS

## 2023-06-28 MED ORDER — HYDRALAZINE HCL 20 MG/ML IJ SOLN: 5.0000 mg | INTRAMUSCULAR | Status: DC | PRN

## 2023-06-28 MED ORDER — ONDANSETRON HCL 4 MG/2ML IJ SOLN
INTRAMUSCULAR | Status: AC
  Filled 2023-06-28: qty 2

## 2023-06-28 MED ORDER — ONDANSETRON HCL 4 MG/2ML IJ SOLN
INTRAMUSCULAR | Status: AC
Start: 2023-06-28 — End: 2023-06-28
  Filled 2023-06-28: qty 2

## 2023-06-28 MED ORDER — ONDANSETRON HCL 4 MG/2ML IJ SOLN
INTRAMUSCULAR | Status: DC | PRN
  Administered 2023-06-28: 4 mg via INTRAVENOUS

## 2023-06-28 MED ORDER — PANTOPRAZOLE SODIUM 40 MG PO TBEC
40.0000 mg | DELAYED_RELEASE_TABLET | Freq: Every day | ORAL | Status: DC
  Administered 2023-06-28 – 2023-07-04 (×7): 40 mg via ORAL
  Filled 2023-06-28 (×7): qty 1

## 2023-06-28 MED ORDER — LACTATED RINGERS IV SOLN: INTRAVENOUS | Status: DC

## 2023-06-28 MED ORDER — DOCUSATE SODIUM 100 MG PO CAPS: 100.0000 mg | ORAL_CAPSULE | Freq: Every day | ORAL | Status: DC

## 2023-06-28 MED ORDER — FENTANYL CITRATE (PF) 250 MCG/5ML IJ SOLN
INTRAMUSCULAR | Status: DC | PRN
  Administered 2023-06-28 (×5): 50 ug via INTRAVENOUS

## 2023-06-28 MED ORDER — PHENYLEPHRINE 80 MCG/ML (10ML) SYRINGE FOR IV PUSH (FOR BLOOD PRESSURE SUPPORT)
PREFILLED_SYRINGE | INTRAVENOUS | Status: AC
  Filled 2023-06-28: qty 10

## 2023-06-28 MED ORDER — LIDOCAINE 2% (20 MG/ML) 5 ML SYRINGE
INTRAMUSCULAR | Status: AC
  Filled 2023-06-28: qty 5

## 2023-06-28 MED ORDER — SIMETHICONE 80 MG PO CHEW
80.0000 mg | CHEWABLE_TABLET | Freq: Four times a day (QID) | ORAL | Status: DC
  Administered 2023-06-28 (×3): 80 mg via ORAL
  Filled 2023-06-28 (×3): qty 1

## 2023-06-28 MED ORDER — VANCOMYCIN HCL 1000 MG IV SOLR
INTRAVENOUS | Status: DC | PRN
  Administered 2023-06-28: 1000 mg via TOPICAL

## 2023-06-28 MED ORDER — PROPOFOL 10 MG/ML IV BOLUS
INTRAVENOUS | Status: DC | PRN
  Administered 2023-06-28: 130 mg via INTRAVENOUS

## 2023-06-28 MED ORDER — MIDAZOLAM HCL 2 MG/2ML IJ SOLN
INTRAMUSCULAR | Status: AC
  Filled 2023-06-28: qty 2

## 2023-06-28 MED ORDER — ORAL CARE MOUTH RINSE: 15.0000 mL | Freq: Once | OROMUCOSAL | Status: AC

## 2023-06-28 MED ORDER — GUAIFENESIN-DM 100-10 MG/5ML PO SYRP: 15.0000 mL | ORAL_SOLUTION | ORAL | Status: DC | PRN

## 2023-06-28 MED ORDER — MAGNESIUM SULFATE 2 GM/50ML IV SOLN: 2.0000 g | Freq: Every day | INTRAVENOUS | Status: DC | PRN

## 2023-06-28 MED ORDER — POTASSIUM CHLORIDE CRYS ER 20 MEQ PO TBCR: 20.0000 meq | EXTENDED_RELEASE_TABLET | Freq: Every day | ORAL | Status: DC | PRN

## 2023-06-28 MED ORDER — PROPOFOL 10 MG/ML IV BOLUS
INTRAVENOUS | Status: AC
Start: 2023-06-28 — End: 2023-06-28
  Filled 2023-06-28: qty 20

## 2023-06-28 MED ORDER — ROCURONIUM BROMIDE 10 MG/ML (PF) SYRINGE
PREFILLED_SYRINGE | INTRAVENOUS | Status: AC
  Filled 2023-06-28: qty 10

## 2023-06-28 MED ORDER — ACETAMINOPHEN 500 MG PO TABS: 1000.0000 mg | ORAL_TABLET | Freq: Once | ORAL | Status: AC

## 2023-06-28 MED ORDER — OXYCODONE HCL 5 MG/5ML PO SOLN
5.0000 mg | Freq: Once | ORAL | Status: AC | PRN
  Administered 2023-06-28: 5 mg via ORAL

## 2023-06-28 MED ORDER — DEXAMETHASONE SODIUM PHOSPHATE 10 MG/ML IJ SOLN
INTRAMUSCULAR | Status: DC | PRN
  Administered 2023-06-28: 10 mg via INTRAVENOUS

## 2023-06-28 MED ORDER — VASOPRESSIN 20 UNIT/ML IV SOLN
INTRAVENOUS | Status: AC
  Filled 2023-06-28: qty 1

## 2023-06-28 SURGICAL SUPPLY — 33 items
BLADE SAW SGTL 73X25 THK (BLADE) ×1 IMPLANT
CANISTER WOUNDNEG PRESSURE 500 (CANNISTER) IMPLANT
COVER SURGICAL LIGHT HANDLE (MISCELLANEOUS) ×1 IMPLANT
DERMABOND ADVANCED .7 DNX12 (GAUZE/BANDAGES/DRESSINGS) IMPLANT
DRAPE DERMATAC (DRAPES) IMPLANT
DRAPE IMP U-DRAPE 54X76 (DRAPES) ×1 IMPLANT
DRAPE INCISE IOBAN 85X60 (DRAPES) ×2 IMPLANT
DRAPE SURG ORHT 6 SPLT 77X108 (DRAPES) ×2 IMPLANT
DRAPE U-SHAPE 47X51 STRL (DRAPES) ×1 IMPLANT
DRESSING PREVENA PLUS CUSTOM (GAUZE/BANDAGES/DRESSINGS) IMPLANT
DURAPREP 26ML APPLICATOR (WOUND CARE) ×1 IMPLANT
ELECTRODE REM PT RTRN 9FT ADLT (ELECTROSURGICAL) ×1 IMPLANT
EVACUATOR 1/8 PVC DRAIN (DRAIN) IMPLANT
GAUZE PAD ABD 8X10 STRL (GAUZE/BANDAGES/DRESSINGS) ×2 IMPLANT
GAUZE SPONGE 4X4 12PLY STRL (GAUZE/BANDAGES/DRESSINGS) ×1 IMPLANT
GLOVE BIOGEL PI IND STRL 9 (GLOVE) ×1 IMPLANT
GLOVE SURG ORTHO 9.0 STRL STRW (GLOVE) ×1 IMPLANT
GOWN STRL REUS W/ TWL XL LVL3 (GOWN DISPOSABLE) ×3 IMPLANT
GRAFT SKIN WND MICRO 38 (Tissue) IMPLANT
GRAFT SKIN WND SURGICLOSE M95 (Tissue) IMPLANT
KIT BASIN OR (CUSTOM PROCEDURE TRAY) ×1 IMPLANT
KIT TURNOVER KIT B (KITS) ×1 IMPLANT
MANIFOLD NEPTUNE II (INSTRUMENTS) ×1 IMPLANT
NS IRRIG 1000ML POUR BTL (IV SOLUTION) ×1 IMPLANT
PACK TOTAL JOINT (CUSTOM PROCEDURE TRAY) ×1 IMPLANT
PAD ARMBOARD POSITIONER FOAM (MISCELLANEOUS) ×2 IMPLANT
STAPLER SKIN PROX 35W (STAPLE) ×1 IMPLANT
SUT ETHIBOND NAB CT1 #1 30IN (SUTURE) ×1 IMPLANT
SUT ETHILON 2 0 PSLX (SUTURE) IMPLANT
SUT VIC AB 1 CT1 27XBRD ANBCTR (SUTURE) ×1 IMPLANT
SUT VIC AB 2-0 CTB1 (SUTURE) ×1 IMPLANT
TOWEL GREEN STERILE (TOWEL DISPOSABLE) ×1 IMPLANT
TOWEL GREEN STERILE FF (TOWEL DISPOSABLE) ×1 IMPLANT

## 2023-06-28 NOTE — Transfer of Care (Signed)
 Immediate Anesthesia Transfer of Care Note  Patient: Katherine Archer  Procedure(s) Performed: DISARTICULATION, HIP (Right: Hip)  Patient Location: PACU  Anesthesia Type:General  Level of Consciousness: awake, alert , and oriented  Airway & Oxygen Therapy: Patient Spontanous Breathing  Post-op Assessment: Report given to RN and Post -op Vital signs reviewed and stable  Post vital signs: Reviewed and stable  Last Vitals:  Vitals Value Taken Time  BP 131/52 06/28/23 10:31  Temp    Pulse 70 06/28/23 10:32  Resp 12 06/28/23 10:32  SpO2 100 % 06/28/23 10:32  Vitals shown include unfiled device data.  Last Pain:  Vitals:   06/28/23 0803  TempSrc:   PainSc: 0-No pain      Patients Stated Pain Goal: 0 (06/27/23 1613)  Complications: There were no known notable events for this encounter.

## 2023-06-28 NOTE — Evaluation (Signed)
 Physical Therapy Evaluation Patient Details Name: Katherine Archer MRN: [id number] DOB: [birth year] Today's Date: 06/28/2023  History of Present Illness  Pt is a 68 y/o F admitted from [facility] to acute services on 06/16/23. 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 Pt underwent R groin I&D & R AKA revision on 06/16/23, removal of R axillofemoral & femorofemoral bypass on 06/21/23. Pt underwent R hip disarticulation on 06/28/23. PMH: HLD, HTN, PAD, stroke  Clinical Impression  Pt seen for PT evaluation with pt received in bed, youngest daughter Katherine Archer) in room & electing to interpret. Pt requires encouragement but agreeable to sitting EOB. Pt is able to transition from semi fowler to long sitting, move to let LLE touch floor with extra time, behaviors demonstrating pain in R hip. Pt tolerates sitting EOB ~2 minutes before reporting too much fatigue & requests to lie back down; pt requires min assist to elevate LLE onto bed but is able to reposition in bed without assistance. Pt would benefit from ongoing PT services to progress mobility as able to reduce fall risk & caregiver burden & increase independence.      If plan is discharge home, recommend the following: A lot of help with walking and/or transfers;A lot of help with bathing/dressing/bathroom;Assist for transportation;Help with stairs or ramp for entrance;Assistance with cooking/housework   Can travel by private vehicle        Equipment Recommendations Other (comment) (defer to next venue)  Recommendations for Other Services  Rehab consult    Functional Status Assessment Patient has had a recent decline in their functional status and demonstrates the ability to make significant improvements in function in a reasonable and predictable amount of time.     Precautions / Restrictions Precautions Precautions: Fall Precaution/Restrictions  Comments: wound vac Restrictions Weight Bearing Restrictions Per Provider Order: Yes RLE Weight Bearing Per Provider Order: Non weight bearing      Mobility  Bed Mobility Overal bed mobility: Needs Assistance Bed Mobility: Supine to Sit, Sit to Supine     Supine to sit: Supervision, HOB elevated, Used rails Sit to supine: Min assist   General bed mobility comments: semi fowler to long sitting, sitting on EOB with LLE off of bed on floor, min assist to elevate L foot from floor>bed, pt able to bridge to scoot to center of bed    Transfers                        Ambulation/Gait                  Stairs            Wheelchair Mobility     Tilt Bed    Modified Rankin (Stroke Patients Only)       Balance Overall balance assessment: Needs assistance Sitting-balance support: Feet supported, Bilateral upper extremity supported Sitting balance-Leahy Scale: Fair Sitting balance - Comments: supervision static sitting                                     Pertinent Vitals/Pain Pain Assessment Pain Assessment: Faces Faces Pain Scale: Hurts little more Pain Location: R hip/surgical site Pain Descriptors / Indicators: Grimacing, Guarding, Discomfort Pain Intervention(s): Monitored during session, Limited activity within patient's tolerance    Home Living Family/patient expects to  be discharged to:: Private residence Living Arrangements: Children;Other relatives Available Help at Discharge: Family;Available 24 hours/day Type of Home: Mobile home Home Access: Ramped entrance;Stairs to enter (plans to have ramp installed by end of July) Entrance Stairs-Rails: Right;Left;Can reach both [employer] of Steps: 5   Home Layout: One level Home Equipment: Cane - single Librarian, academic (2 wheels)      Prior Function Prior Level of Function : Independent/Modified Independent             Mobility Comments: Alternates  between using SPC and RW. Reports no recent falls. ADLs Comments: Pt typically Independent with ADLs. Pt reports often taking a sponge bath due to the tub/shower bring small. Has family assist with driving     Extremity/Trunk Assessment   Upper Extremity Assessment Upper Extremity Assessment: Generalized weakness    Lower Extremity Assessment Lower Extremity Assessment: Generalized weakness RLE Deficits / Details: s/p R hip disarticulation       Communication   Communication Communication:  (daughters elected to interpret during session)    Cognition Arousal: Alert Behavior During Therapy: Anxious                             Following commands: Intact       Cueing Cueing Techniques: Verbal cues     General Comments      Exercises     Assessment/Plan    PT Assessment Patient needs continued PT services  PT Problem List Decreased strength;Pain;Decreased activity tolerance;Decreased balance;Decreased mobility;Decreased knowledge of precautions;Decreased range of motion;Decreased knowledge of use of DME;Decreased safety awareness;Decreased skin integrity;Impaired sensation       PT Treatment Interventions DME instruction;Modalities;Balance training;Neuromuscular re-education;Gait training;Stair training;Functional mobility training;Therapeutic activities;Therapeutic exercise;Manual techniques;Wheelchair mobility training;Patient/family education    PT Goals (Current goals can be found in the Care Plan section)  Acute Rehab PT Goals Patient Stated Goal: get better, eventually go home PT Goal Formulation: With patient/family Time For Goal Achievement: 07/12/23 Potential to Achieve Goals: Fair    Frequency Min 4X/week     Co-evaluation               AM-PAC PT 6 Clicks Mobility  Outcome Measure Help needed turning from your back to your side while in a flat bed without using bedrails?: A Little Help needed moving from lying on your back to  sitting on the side of a flat bed without using bedrails?: A Lot Help needed moving to and from a bed to a chair (including a wheelchair)?: Total Help needed standing up from a chair using your arms (e.g., wheelchair or bedside chair)?: Total Help needed to walk in hospital room?: Total Help needed climbing 3-5 steps with a railing? : Total 6 Click Score: 9    End of Session   Activity Tolerance: Patient limited by fatigue Patient left: in bed;with call bell/phone within reach;with bed alarm set;with family/visitor present Nurse Communication: Mobility status PT Visit Diagnosis: Difficulty in walking, not elsewhere classified (R26.2);Other abnormalities of gait and mobility (R26.89);Muscle weakness (generalized) (M62.81) Pain - Right/Left: Right Pain - part of body: Hip    Time: 8593-8575 PT Time Calculation (min) (ACUTE ONLY): 18 min   Charges:   PT Evaluation $PT Eval High Complexity: 1 High   PT General Charges $$ ACUTE PT VISIT: 1 Visit         Katherine Archer, PT, DPT 06/28/23, 2:34 PM   Katherine Archer 06/28/2023, 2:32 PM

## 2023-06-28 NOTE — Anesthesia Procedure Notes (Signed)
 Procedure Name: Intubation Date/Time: 06/28/2023 9:25 AM  Performed by: Christopher Comings, CRNAPre-anesthesia Checklist: Patient identified, Emergency Drugs available, Suction available and Patient being monitored Patient Re-evaluated:Patient Re-evaluated prior to induction Oxygen Delivery Method: Circle system utilized Preoxygenation: Pre-oxygenation with 100% oxygen Induction Type: IV induction Ventilation: Mask ventilation without difficulty Laryngoscope Size: Mac and 4 Grade View: Grade I Tube type: Oral Tube size: 7.0 mm Number of attempts: 1 Airway Equipment and Method: Stylet and Oral airway Placement Confirmation: ETT inserted through vocal cords under direct vision, positive ETCO2 and breath sounds checked- equal and bilateral Secured at: 21 cm Tube secured with: Tape Dental Injury: Teeth and Oropharynx as per pre-operative assessment

## 2023-06-28 NOTE — Hospital Course (Addendum)
 PMH of HTN, PAD, HLD, CVA, presented with wound dehiscence. Vascular surgery as well as orthopedic surgery were consulted. 1/24 right femoral endarterectomy and above-knee popliteal bypass 5/9 right femoral thrombectomy, right below-knee popliteal bypass 5/26 ligation of the bypass and redo above and below knee popliteal bypass. 5/28 I&D of right groin wound.  With sartorius muscle flap.  As well as right AKA. 6/13 redo right AKA with I&D. 6/18 removal of the bypass graft and I&D of the AKA. 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 Was on IV vancomycin  now on oral Zyvox . ID consult appreciated. Vascular surgery as well as orthopedic surgery following. Wound VAC is not working adequately per family.  Discussed with Dr. Harden as well as RN.  No alarms heard from the wound VAC.  Difficult to achieve good seal given groin wound per discussion with Dr. Harden.  Plan was to discontinue wound VAC on discharge.  Patient close to medical readiness for discharge.  Will discontinue wound VAC.  PAD. Vascular surgery following. Extensive vascular surgery requiring multiple redo procedures as well as graft removal. Currently has a wound VAC.  Postop nausea. X-ray abdomen unremarkable.  Vaginal candidiasis. Diflucan  for 1 week.  Vaginal pessary. Falling  behind on exchange. Appreciate GYN consultation. Outpatient follow-up recommended.  HTN. Blood pressure stable. Continue home regimen.  Anemia secondary to acute blood loss. Monitor H&H. Transfuse for hemoglobin less than 7.  1 PRBC given on 6/26.  1 more PRBC in 6/29.  Patient had a fever after PRBC currently resolved.  Perhaps use Tylenol  next time as a premedication.  Severe protein calorie malnutrition. Dietitian following. Placing the patient at a high risk for poor outcome.  Hyponatremia. Clinically  nonsignificant.  Leukocytosis. Likely stress reaction. Monitor.  DVT prophylaxis. Family had questions about patient's DVT prophylaxis and requested to switch to pills. I explained that given concern regards to anemia at present a pill DVT prophylaxis would not be safe due to long-acting nature of the medicine as well as proceed with a higher dose than heparin  5000 units twice daily. Family understood.  Currently DVT prophylaxis on hold due to anemia.  PICC line. Currently does not require central access.  Must require IV access therefore we will continue PICC line.  Discussed with IV team.

## 2023-06-28 NOTE — Anesthesia Postprocedure Evaluation (Signed)
 Anesthesia Post Note  Patient: Katherine Archer  Procedure(s) Performed: DISARTICULATION, HIP (Right: Hip)     Patient location during evaluation: PACU Anesthesia Type: General Level of consciousness: awake Pain management: pain level controlled Vital Signs Assessment: post-procedure vital signs reviewed and stable Respiratory status: spontaneous breathing, nonlabored ventilation and respiratory function stable Cardiovascular status: blood pressure returned to baseline and stable Postop Assessment: no apparent nausea or vomiting Anesthetic complications: no   There were no known notable events for this encounter.  Last Vitals:  Vitals:   06/28/23 1145 06/28/23 1159  BP: (!) 152/46 (!) 140/52  Pulse: 68 67  Resp: 20 16  Temp:  (!) 36.4 C  SpO2: 100% 100%    Last Pain:  Vitals:   06/28/23 1159  TempSrc: Oral  PainSc:                  [name]

## 2023-06-28 NOTE — Progress Notes (Signed)
 [facility] Progress Note Patient: Katherine Archer FMW:[id number] DOB: 1955/03/11 DOA: 06/16/2023  DOS: the patient was seen and examined on 06/28/2023  Brief Hospital Course: PMH of HTN, PAD, HLD, CVA, presented with wound dehiscence. Vascular surgery as well as orthopedic surgery were consulted.  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 Underwent AKA and hip disarticulation. Currently on IV antibiotic. Monitor.  PAD. Vascular surgery following. Underwent left common femoral artery bypass graft and I&D of the right AKA. Currently has a wound VAC.  Postop nausea. X-ray abdomen unremarkable.  Vaginal candidiasis. Initiating Diflucan.  Vaginal pessary. Following behind on exchange. Will discuss with GYN tomorrow.  HTN. Blood pressure stable. Continue home regimen.  Anemia secondary to acute blood loss. Monitor H&H. Transfuse for hemoglobin less than 8.   Subjective: No nausea no vomiting.  Reported some nausea.  Abdomen was distended.  Not passing gas.  Physical Exam: General: in Mild distress, No Rash Cardiovascular: S1 and S2 Present, No Murmur Respiratory: Good respiratory effort, Bilateral Air entry present. No Crackles, No wheezes Abdomen: Bowel Sound present, No tenderness Extremities: No edema, right AKA Neuro: Alert and oriented x3, no new focal deficit  Data Reviewed: I have Reviewed nursing notes, Vitals, and Lab results. Since last encounter, pertinent lab results CBC and BMP   . I have ordered test including CBC and BMP  . Ordered x-ray abdomen.  Disposition: Status is: Inpatient Remains inpatient appropriate because: Monitor for postop recovery  SCD's Start: 06/28/23 1155   Family Communication: Family at bedside Level of care: Progressive   Vitals:   06/28/23 1130 06/28/23 1145 06/28/23 1159 06/28/23 1623  BP: (!) 144/56 (!) 152/46 (!) 140/52 (!) 152/47  Pulse: 65 68 67 69  Resp: 14 20 16  (!) 22  Temp: 97.6 F  (36.4 C)  (!) 97.5 F (36.4 C) 97.7 F (36.5 C)  TempSrc:   Oral Oral  SpO2: 100% 100% 100% 100%  Weight:      Height:         Author: Yetta Blanch, MD 06/28/2023 7:18 PM  Please look on [email or web address] to find out who is on call.

## 2023-06-28 NOTE — Interval H&P Note (Signed)
 History and Physical Interval Note:  06/28/2023 6:45 AM  Katherine Archer  has presented today for surgery, with the diagnosis of Gangrene Right Above Knee Amputation.  The various methods of treatment have been discussed with the patient and family. After consideration of risks, benefits and other options for treatment, the patient has consented to  Procedure(s): DISARTICULATION, HIP (Right) as a surgical intervention.  The patient's history has been reviewed, patient examined, no change in status, stable for surgery.  I have reviewed the patient's chart and labs.  Questions were answered to the patient's satisfaction.     Shelsy Seng V [name]

## 2023-06-28 NOTE — Progress Notes (Signed)
 [facility]   Heparin  SQ held.  May restart once vac out put is < 50 CC in 24 hours.   Maurilio Deland Collet PA-C [phone]

## 2023-06-28 NOTE — Op Note (Signed)
 06/28/2023  10:33 AM  PATIENT:  Katherine Archer    PRE-OPERATIVE DIAGNOSIS:  Gangrene Right Above Knee Amputation  POST-OPERATIVE DIAGNOSIS:  Same  PROCEDURE:  DISARTICULATION, HIP Application Kerecis micro graft 95 cm x 1 and 38 cm x 1. Application of customizable wound VAC. Application of vancomycin  powder 1 g.  SURGEON:  Jerona LULLA Sage, MD  PHYSICIAN ASSISTANT: April Green ANESTHESIA:   General  PREOPERATIVE INDICATIONS:  [name] is a  68 y.o. female with a diagnosis of Gangrene Right Above Knee Amputation who failed conservative measures and elected for surgical management.    The risks benefits and alternatives were discussed with the patient preoperatively including but not limited to the risks of infection, bleeding, nerve injury, cardiopulmonary complications, the need for revision surgery, among others, and the patient was willing to proceed.  OPERATIVE IMPLANTS:   Implant Name Type Inv. Item Serial No. Manufacturer Lot No. LRB No. Used Action  GRAFT SKIN WND SURGICLOSE M95 - [id number] Tissue GRAFT SKIN WND SURGICLOSE M95  [employer] [id number] Right 1 Implanted  GRAFT SKIN WND MICRO 38 - [id number] Tissue GRAFT SKIN WND MICRO 38  [employer] [id number] Right 1 Implanted    @ENCIMAGES @  OPERATIVE FINDINGS: Patient had good petechial bleeding.  Wound margins were clear.  Excellent granulation tissue beneath the wound VAC that was in place.  OPERATIVE PROCEDURE: Patient brought the operating room and underwent general anesthetic.  After adequate levels anesthesia obtained patient's right lower extremity was prepped using DuraPrep draped into a sterile field a timeout was called.  A teardrop incision was made to excise the groin wound and extending distally.  The hip was disarticulated and the femur and necrotic skin and soft tissue and muscle was excised in 1 block of tissue.  The vascular bundles were suture-ligated with 2-0 silk over the femoral vessels.  The wound  was irrigated with Vashe.  The wound bed was filled with 95 cm of Kerecis micro graft with 1 g of vancomycin  powder.  The wound was closed using 2-0 nylon.  There is a small areas of defect in the wound closure and this was filled with 38 cm of Kerecis micro graft.  This was then covered with the customizable wound VAC sponge this had a good suction fit this was secured with Dermabond patient was extubated taken the PACU in stable condition.   DISCHARGE PLANNING:  Antibiotic duration: Postoperative antibiotics  Weightbearing: Nonweightbearing on the right  Pain medication: Opioid pathway  Dressing care/ Wound VAC: Continue wound VAC  Ambulatory devices: Transfer training  Discharge to: Skilled nursing  Follow-up: In the office 1 week post operative.

## 2023-06-29 ENCOUNTER — Encounter (HOSPITAL_COMMUNITY): Payer: Self-pay | Admitting: Orthopedic Surgery

## 2023-06-29 ENCOUNTER — Other Ambulatory Visit (HOSPITAL_COMMUNITY): Payer: Self-pay

## 2023-06-29 DIAGNOSIS — E785 Hyperlipidemia, unspecified: Secondary | ICD-10-CM

## 2023-06-29 DIAGNOSIS — B950 Streptococcus, group A, as the cause of diseases classified elsewhere: Secondary | ICD-10-CM

## 2023-06-29 DIAGNOSIS — D72829 Elevated white blood cell count, unspecified: Secondary | ICD-10-CM

## 2023-06-29 DIAGNOSIS — T827XXA Infection and inflammatory reaction due to other cardiac and vascular devices, implants and grafts, initial encounter: Secondary | ICD-10-CM

## 2023-06-29 DIAGNOSIS — N819 Female genital prolapse, unspecified: Secondary | ICD-10-CM

## 2023-06-29 DIAGNOSIS — D62 Acute posthemorrhagic anemia: Secondary | ICD-10-CM

## 2023-06-29 DIAGNOSIS — E871 Hypo-osmolality and hyponatremia: Secondary | ICD-10-CM

## 2023-06-29 DIAGNOSIS — Z89621 Acquired absence of right hip joint: Secondary | ICD-10-CM

## 2023-06-29 HISTORY — DX: Female genital prolapse, unspecified: N81.9

## 2023-06-29 LAB — BASIC METABOLIC PANEL WITH GFR
Anion gap: 9 (ref 5–15)
BUN: 8 mg/dL (ref 8–23)
CO2: 25 mmol/L (ref 22–32)
Calcium: 9.1 mg/dL (ref 8.9–10.3)
Chloride: 98 mmol/L (ref 98–111)
Creatinine, Ser: 0.49 mg/dL (ref 0.44–1.00)
GFR, Estimated: >60 mL/min (ref >60)
Glucose, Bld: 114 mg/dL — ABNORMAL HIGH (ref 70–99)
Potassium: 4.4 mmol/L (ref 3.5–5.1)
Sodium: 132 mmol/L — ABNORMAL LOW (ref 135–145)

## 2023-06-29 LAB — PROTIME-INR
INR: 1.1 (ref 0.8–1.2)
Prothrombin Time: 15.1 s (ref 11.4–15.2)

## 2023-06-29 LAB — CBC WITH DIFFERENTIAL/PLATELET
Abs Immature Granulocytes: 0.44 10*3/uL — ABNORMAL HIGH (ref 0.00–0.07)
Basophils Absolute: 0 10*3/uL (ref 0.0–0.1)
Basophils Relative: 0 %
Eosinophils Absolute: 0 10*3/uL (ref 0.0–0.5)
Eosinophils Relative: 0 %
HCT: 22.9 % — ABNORMAL LOW (ref 36.0–46.0)
Hemoglobin: 7.2 g/dL — ABNORMAL LOW (ref 12.0–15.0)
Immature Granulocytes: 2 %
Lymphocytes Relative: 11 %
Lymphs Abs: 2.3 10*3/uL (ref 0.7–4.0)
MCH: 29.3 pg (ref 26.0–34.0)
MCHC: 31.4 g/dL (ref 30.0–36.0)
MCV: 93.1 fL (ref 80.0–100.0)
Monocytes Absolute: 1.5 10*3/uL — ABNORMAL HIGH (ref 0.1–1.0)
Monocytes Relative: 7 %
Neutro Abs: 17.4 10*3/uL — ABNORMAL HIGH (ref 1.7–7.7)
Neutrophils Relative %: 80 %
Platelets: 369 10*3/uL (ref 150–400)
RBC: 2.46 MIL/uL — ABNORMAL LOW (ref 3.87–5.11)
RDW: 15.3 % (ref 11.5–15.5)
WBC: 21.6 10*3/uL — ABNORMAL HIGH (ref 4.0–10.5)
nRBC: 0 % (ref 0.0–0.2)

## 2023-06-29 LAB — CBC
HCT: 24.7 % — ABNORMAL LOW (ref 36.0–46.0)
Hemoglobin: 7.9 g/dL — ABNORMAL LOW (ref 12.0–15.0)
MCH: 29.8 pg (ref 26.0–34.0)
MCHC: 32 g/dL (ref 30.0–36.0)
MCV: 93.2 fL (ref 80.0–100.0)
Platelets: 362 10*3/uL (ref 150–400)
RBC: 2.65 MIL/uL — ABNORMAL LOW (ref 3.87–5.11)
RDW: 15.4 % (ref 11.5–15.5)
WBC: 18.6 10*3/uL — ABNORMAL HIGH (ref 4.0–10.5)
nRBC: 0 % (ref 0.0–0.2)

## 2023-06-29 LAB — PREPARE RBC (CROSSMATCH)

## 2023-06-29 LAB — FIBRINOGEN: Fibrinogen: 646 mg/dL — ABNORMAL HIGH (ref 210–475)

## 2023-06-29 LAB — HEMOGLOBIN AND HEMATOCRIT, BLOOD
HCT: 26.2 % — ABNORMAL LOW (ref 36.0–46.0)
Hemoglobin: 8.3 g/dL — ABNORMAL LOW (ref 12.0–15.0)

## 2023-06-29 MED ORDER — OXYCODONE HCL 5 MG PO TABS
5.0000 mg | ORAL_TABLET | Freq: Four times a day (QID) | ORAL | Status: DC | PRN
  Administered 2023-07-02 – 2023-07-04 (×2): 5 mg via ORAL
  Filled 2023-06-29 (×4): qty 1

## 2023-06-29 MED ORDER — ACETAMINOPHEN 325 MG PO TABS
650.0000 mg | ORAL_TABLET | Freq: Four times a day (QID) | ORAL | Status: DC | PRN
  Administered 2023-06-30 – 2023-07-03 (×2): 650 mg via ORAL
  Filled 2023-06-29 (×2): qty 2

## 2023-06-29 MED ORDER — ALUM & MAG HYDROXIDE-SIMETH 200-200-20 MG/5ML PO SUSP: 15.0000 mL | ORAL | Status: DC | PRN

## 2023-06-29 MED ORDER — HYDRALAZINE HCL 20 MG/ML IJ SOLN: 10.0000 mg | INTRAMUSCULAR | Status: DC | PRN

## 2023-06-29 MED ORDER — SODIUM CHLORIDE 0.9% IV SOLUTION: Freq: Once | INTRAVENOUS | Status: DC

## 2023-06-29 MED ORDER — OXYCODONE HCL 5 MG PO TABS
10.0000 mg | ORAL_TABLET | Freq: Four times a day (QID) | ORAL | Status: DC | PRN
  Administered 2023-06-29 – 2023-07-04 (×12): 10 mg via ORAL
  Filled 2023-06-29 (×14): qty 2

## 2023-06-29 NOTE — Progress Notes (Signed)
 Patient ID: Katherine Archer, female   DOB: October 10, 1955, 68 y.o.   MRN: [id number] Patient is postoperative day 1 right hip disarticulation.  She is resting comfortably without complaints.  There is 250 cc in the wound VAC canister there is not a good suction fit.  Discharge planning based on therapy and family's request.  Will discharge the wound VAC and transition to dry dressing change at discharge.

## 2023-06-29 NOTE — Consult Note (Signed)
 Impression: Pelvic organ prolapse with pessary in place Vaginal discharge  Recommendations: Pessaries are inert and can remain in place for an extended period of time. Her last pessary change with her OB/GYN was in February of this year with a Pap smear which was normal. Prior to this her previous pessary had been changed and July 2024. Patient is of pessaries frequently have increased vaginal discharge which can be malodorous at times. We can treat any infection if needed the most of the time there is no infection associated with this. Patient should follow-up with her OB/GYN as an outpatient for her next pessary change.  Reason for consult: Patient is a 68 y.o. 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  We are asked to see the patient regarding possible need for pessary change.  Patient has history of pessary placement for anterior and apical stage II pelvic organ prolapse.  She was fitted with a size 4 ring pessary and September 2022.  She continues to have routine visits for pessary maintenance with her OB/GYN.  Since admission she has had some white vaginal discharge which was treated with Diflucan.  She is without specific complaint today.  Her pessary is in place.  The patient is likely to require further SNF versus CIR placement following this hospitalization and so question was asked if we needed to change her pessary at this time.  Past Medical History:  Diagnosis Date   Cervical polyp 09/18/2018   Critical lower limb ischemia (HCC)    Encounter for screening mammogram for breast cancer 05/05/2016   Fibrocystic breast changes of both breasts 04/29/2015   Hyperlipidemia    Hypertension    PAD (peripheral artery disease) (HCC) 08/23/2019   Pelvic prolapse 06/2020   Pelvic ring in place, needs to be changed every 3 months   Stroke (HCC) 08/23/2019    Past Surgical History:  Procedure Laterality Date   ABDOMINAL  AORTOGRAM W/LOWER EXTREMITY Bilateral 08/23/2019   Procedure: ABDOMINAL AORTOGRAM W/ Bilateral LOWER EXTREMITY [place];  Surgeon: Harvey Carlin BRAVO, MD;  Location: [facility] INVASIVE CV LAB;  Service: Cardiovascular;  Laterality: Bilateral;   AMPUTATION Right 05/31/2023   Procedure: AMPUTATION, ABOVE KNEE SARTORIOUS MUSCLE FLAP;  Surgeon: Serene Gaile ORN, MD;  Location: [facility] OR;  Service: Vascular;  Laterality: Right;   APPLICATION OF WOUND VAC Right 05/31/2023   Procedure: APPLICATION, WOUND VAC;  Surgeon: Serene Gaile ORN, MD;  Location: [facility] OR;  Service: Vascular;  Laterality: Right;   APPLICATION OF WOUND VAC Right 06/16/2023   Procedure: APPLICATION, WOUND VAC;  Surgeon: Serene Gaile ORN, MD;  Location: [facility] OR;  Service: Vascular;  Laterality: Right;   APPLICATION OF WOUND VAC Right 06/21/2023   Procedure: APPLICATION, WOUND VAC;  Surgeon: Serene Gaile ORN, MD;  Location: [facility] OR;  Service: Vascular;  Laterality: Right;   APPLICATION, SKIN SUBSTITUTE Right 05/12/2023   Procedure: APPLICATION, SKIN SUBSTITUTE KERECIS 38SQ CM;  Surgeon: Serene Gaile ORN, MD;  Location: [facility] OR;  Service: Vascular;  Laterality: Right;   APPLICATION, SKIN SUBSTITUTE Right 06/16/2023   Procedure: APPLICATION, SKIN SUBSTITUTE;  Surgeon: Serene Gaile ORN, MD;  Location: [facility];  Service: Vascular;  Laterality: Right;   APPLICATION, SKIN SUBSTITUTE Right 06/21/2023   Procedure: APPLICATION, SKIN SUBSTITUTE;  Surgeon: Serene Gaile ORN, MD;  Location: [facility];  Service: Vascular;  Laterality: Right;   AVGG REMOVAL Right 05/29/2023   Procedure: REMOVAL BYPASS GRAFT RIGHT LEG;  Surgeon: Lanis Fonda BRAVO, MD;  Location: [facility];  Service: Vascular;  Laterality: Right;   AXILLARY-FEMORAL BYPASS GRAFT Right 11/11/2019   Procedure: BYPASS GRAFT RIGHT AXILLA-BIFEMORAL;  Surgeon: Harvey Carlin BRAVO, MD;  Location: [facility];  Service: Vascular;  Laterality: Right;   AXILLARY-FEMORAL BYPASS GRAFT Left 06/21/2023   Procedure: CREATION, BYPASS, ARTERIAL, LEFT  AXILLARY TO LEFT FEMORAL, USING GRAFT;  Surgeon: Serene Gaile ORN, MD;  Location: [facility];  Service: Vascular;  Laterality: Left;   BRAIN SURGERY     BREAST SURGERY Bilateral 2014   1980 R breast surgery   CEREBRAL ANEURYSM REPAIR     ENDARTERECTOMY FEMORAL Bilateral 11/11/2019   Procedure: ENDARTERECTOMY FEMORAL BILATERALLY;  Surgeon: Harvey Carlin BRAVO, MD;  Location: [facility] OR;  Service: Vascular;  Laterality: Bilateral;   ENDARTERECTOMY FEMORAL Right 01/26/2022   Procedure: REDO RIGHT COMMON FEMORAL ENDARTERECTOMY USING 0.8CM X 7.6CM HEMASHIELD PLATINUM FINESSE PATCH;  Surgeon: Serene Gaile ORN, MD;  Location: [facility];  Service: Vascular;  Laterality: Right;   FEMORAL-POPLITEAL BYPASS GRAFT Right 01/26/2022   Procedure: RIGHT FEMORAL-POPLITEAL BYPASS GRAFT USING GORE X 80CM PROPATEN GRAFT;  Surgeon: Serene Gaile ORN, MD;  Location: [facility];  Service: Vascular;  Laterality: Right;   FEMORAL-POPLITEAL BYPASS GRAFT Right 05/12/2023   Procedure: BYPASS GRAFT FEMORAL-POPLITEAL ARTERY;  Surgeon: Serene Gaile ORN, MD;  Location: [facility];  Service: Vascular;  Laterality: Right;   INCISION AND DRAINAGE OF WOUND Right 05/31/2023   Procedure: IRRIGATION AND DEBRIDEMENT WOUND;  Surgeon: Serene Gaile ORN, MD;  Location: [facility];  Service: Vascular;  Laterality: Right;  RIGHT GROIN WASHOUT   INCISION AND DRAINAGE OF WOUND Right 06/16/2023   Procedure: IRRIGATION AND DEBRIDEMENT WOUND;  Surgeon: Serene Gaile ORN, MD;  Location: [facility];  Service: Vascular;  Laterality: Right;  Right Groin   INCISION AND DRAINAGE OF WOUND Right 06/21/2023   Procedure: IRRIGATION AND DEBRIDEMENT OF RIGHT GROIN AND RIGHT ABOVE KNEE AMPUTATION;  Surgeon: Serene Gaile ORN, MD;  Location: [facility];  Service: Vascular;  Laterality: Right;   IR RADIOLOGIST EVAL & MGMT  04/02/2020   IR RADIOLOGIST EVAL & MGMT  03/29/2021   REMOVAL OF GRAFT Right 06/21/2023   Procedure: REMOVAL, GRAFT, RIGHT AXILLARY BIFEMORAL GRAFT;  Surgeon: Serene Gaile ORN, MD;   Location: [facility];  Service: Vascular;  Laterality: Right;   REVISION AMPUTATION, BELOW THE KNEE Right 06/16/2023   Procedure: REVISION AMPUTATION, ABOVE THE KNEE;  Surgeon: Serene Gaile ORN, MD;  Location: [facility];  Service: Vascular;  Laterality: Right;   THROMBECTOMY FEMORAL ARTERY Right 01/26/2022   Procedure: THROMBECTOMY RIGHT FEMORAL ARTERY;  Surgeon: Serene Gaile ORN, MD;  Location: [facility];  Service: Vascular;  Laterality: Right;   THROMBECTOMY OF BYPASS GRAFT FEMORAL- TIBIAL ARTERY Right 05/12/2023   Procedure: THROMBECTOMY, BYPASS GRAFT, ARTERIAL;  Surgeon: Serene Gaile ORN, MD;  Location: [facility];  Service: Vascular;  Laterality: Right;   TUBAL LIGATION  1990    Family History  Problem Relation Age of Onset   Heart Problems Father    Heart Problems Brother    Heart Problems Brother     Social History   Socioeconomic History   Marital status: Divorced    Spouse name: Not on file   Number of children: Not on file   Years of education: Not on file   Highest education level: Not on file  Occupational History   Not on file  Tobacco Use   Smoking status: Former    Current packs/day: 0.00    Types: Cigarettes  Quit date: 10/04/2019    Years since quitting: 3.7    Passive exposure: Never   Smokeless tobacco: Never  Vaping Use   Vaping status: Never Used  Substance and Sexual Activity   Alcohol  use: No   Drug use: No   Sexual activity: Not Currently  Other Topics Concern   Not on file  Social History Narrative   Not on file   Social Drivers of Health   Financial Resource Strain: Not on file  Food Insecurity: No Food Insecurity (06/17/2023)   Hunger Vital Sign    Worried About Running Out of Food in the Last Year: Never true    Ran Out of Food in the Last Year: Never true  Transportation Needs: No Transportation Needs (06/17/2023)   PRAPARE - Administrator, Civil Service (Medical): No    Lack of Transportation (Non-Medical): No  Physical Activity: Not on file   Stress: Not on file  Social Connections: Socially Isolated (06/17/2023)   Social Connection and Isolation Panel    Frequency of Communication with Friends and Family: More than three times a week    Frequency of Social Gatherings with Friends and Family: More than three times a week    Attends Religious Services: Never    Database administrator or Organizations: No    Attends Banker Meetings: Never    Marital Status: Widowed  Intimate Partner Violence: Unknown (06/17/2023)   Humiliation, Afraid, Rape, and Kick questionnaire    Fear of Current or Ex-Partner: No    Emotionally Abused: No    Physically Abused: Not on file    Sexually Abused: No     sodium chloride    Intravenous Once   amLODipine   10 mg Oral Daily   ascorbic acid   250 mg Oral BID   Chlorhexidine  Gluconate Cloth  6 each Topical Daily   feeding supplement  237 mL Oral BID BM   fluconazole  100 mg Oral Daily   gabapentin   300 mg Oral BID   hydrALAZINE   25 mg Oral Q8H   metoprolol  succinate  12.5 mg Oral Daily   multivitamin with minerals  1 tablet Oral Daily   oxyCODONE   10 mg Oral QHS   pantoprazole   40 mg Oral Daily   polyethylene glycol  17 g Oral Daily   senna-docusate  1 tablet Oral BID   sodium chloride  flush  3 mL Intravenous Q12H    Allergies  Allergen Reactions   Penicillins Other (See Comments)    Shaky and Dizziness 20 years ago    Review of Systems - History obtained from chart review  Exam Vitals:   06/29/23 1339 06/29/23 1454  BP: (!) 123/45   Pulse:    Resp:    Temp:  (P) 98.4 F (36.9 C)  SpO2:       Physical Examination: General appearance - chronically ill appearing Chest -  normal effort Abdomen -soft  Labs: Results for orders placed or performed during the hospital encounter of 06/16/23 (from the past 24 hours)  Basic metabolic panel with GFR     Status: Abnormal   Collection Time: 06/29/23  5:07 AM  Result Value Ref Range   Sodium 132 (L) 135 - 145 mmol/L    Potassium 4.4 3.5 - 5.1 mmol/L   Chloride 98 98 - 111 mmol/L   CO2 25 22 - 32 mmol/L   Glucose, Bld 114 (H) 70 - 99 mg/dL   BUN 8 8 - 23 mg/dL  Creatinine, Ser 0.49 0.44 - 1.00 mg/dL   Calcium  9.1 8.9 - 10.3 mg/dL   GFR, Estimated >39 >39 mL/min   Anion gap 9 5 - 15  CBC with Differential/Platelet     Status: Abnormal   Collection Time: 06/29/23  5:07 AM  Result Value Ref Range   WBC 21.6 (H) 4.0 - 10.5 K/uL   RBC 2.46 (L) 3.87 - 5.11 MIL/uL   Hemoglobin 7.2 (L) 12.0 - 15.0 g/dL   HCT 77.0 (L) 63.9 - 53.9 %   MCV 93.1 80.0 - 100.0 fL   MCH 29.3 26.0 - 34.0 pg   MCHC 31.4 30.0 - 36.0 g/dL   RDW 84.6 88.4 - 84.4 %   Platelets 369 150 - 400 K/uL   nRBC 0.0 0.0 - 0.2 %   Neutrophils Relative % 80 %   Neutro Abs 17.4 (H) 1.7 - 7.7 K/uL   Lymphocytes Relative 11 %   Lymphs Abs 2.3 0.7 - 4.0 K/uL   Monocytes Relative 7 %   Monocytes Absolute 1.5 (H) 0.1 - 1.0 K/uL   Eosinophils Relative 0 %   Eosinophils Absolute 0.0 0.0 - 0.5 K/uL   Basophils Relative 0 %   Basophils Absolute 0.0 0.0 - 0.1 K/uL   Immature Granulocytes 2 %   Abs Immature Granulocytes 0.44 (H) 0.00 - 0.07 K/uL  Prepare RBC (crossmatch)     Status: None   Collection Time: 06/29/23  8:30 AM  Result Value Ref Range   Order Confirmation      ORDER PROCESSED BY BLOOD BANK Performed at [facility], [address] [place], [place] [address]   Hemoglobin and hematocrit, blood     Status: Abnormal   Collection Time: 06/29/23  9:47 AM  Result Value Ref Range   Hemoglobin 8.3 (L) 12.0 - 15.0 g/dL   HCT 73.7 (L) 63.9 - 53.9 %  Protime-INR     Status: None   Collection Time: 06/29/23  9:47 AM  Result Value Ref Range   Prothrombin Time 15.1 11.4 - 15.2 seconds   INR 1.1 0.8 - 1.2  Fibrinogen      Status: Abnormal   Collection Time: 06/29/23  9:47 AM  Result Value Ref Range   Fibrinogen  646 (H) 210 - 475 mg/dL    Radiological Studies DG Abd Portable 1V Result Date: 06/28/2023 CLINICAL DATA:  Abdominal pain  and distention. EXAM: PORTABLE ABDOMEN - 1 VIEW COMPARISON:  [facility] bi-iliac CTA dated 05/29/2023. FINDINGS: Interval amputation of the right leg with no bone destruction or periosteal reaction involving the pelvic bones. No soft tissue gas. Right pelvic skin clips. Normal bowel-gas pattern. Right lower skin clips. Pessary. IMPRESSION: 1. Interval amputation of the right leg. 2. No acute abnormality. Electronically Signed   By: Elspeth Bathe M.D.   On: 06/28/2023 15:28   US  EKG Site Rite Result Date: 06/14/2023 If Site Rite image not attached, placement could not be confirmed due to current cardiac rhythm.  US  EKG SITE RITE Result Date: 06/01/2023 If Site Rite image not attached, placement could not be confirmed due to current cardiac rhythm.   Thank you so much for allowing us  to participate in the care of this patient.  We will continue to follow with you. Please call the attending OB/GYN physician with questions or concerns at [phone] M-F 8a-5p, after hours and on weekends, we can be reached at [phone].

## 2023-06-29 NOTE — PMR Pre-admission (Signed)
 PMR Admission Coordinator Pre-Admission Assessment  Patient: Katherine Archer is an 68 y.o., female MRN: [id number] DOB: May 18, 1955 Height: (P) 5' 6 (167.6 cm) Weight: 57 kg         Insurance Information HMO:     PPO: yes     PCP:      IPA:      80/20:      OTHER:  PRIMARY: [employer] Medicare HMO/PPO      Policy#:[id number]     Subscriber: pt CM Name: Tiffany      Phone#: [phone]     Fax#: [phone] Pre-Cert#: [id number] approved 7/1/ until 7/8      Employer:  Benefits:  Phone #: [phone]     Name: 6/30 Eff. Date: 01/04/23     Deduct: none      Out of Pocket Max: $4150      Life Max: none  CIR: $300 co pay per day days 1 until 6      SNF: $10 co pay per day days 1 until 20; $214 co pay per day days 21 until 100 Outpatient: $10 per visit     Co-Pay:  Home Health: 100%      Co-Pay:  DME: 80%     Co-Pay: 20% Providers: in network  SECONDARY: Medicaid of [place]      Policy#: [id number]        Financial Counselor:       Phone#:   The "Data Collection Information Summary" for patients in Inpatient Rehabilitation Facilities with attached "Privacy Act Statement-Health Care Records" was provided and verbally reviewed with: Family  Emergency Contact Information Contact Information     Name Relation Home Work Mobile   [place] Sister   [phone]   Revera,Melody Granddaughter   [phone]   Revera,Annie Daughter [phone]      Other Contacts     Name Relation Home Work Mobile   Henderson,Luz Daughter   [phone]      Current Medical History  Patient Admitting Diagnosis: hip disarticulation  History of Present Illness: Aldora Perman is a 68 year old female with history of HTN, stroke, pelvic prolapse (tx w/pessary) PAD with multiple procedures-->right axillary bifemoral BG 11/21, critical limb ischemia s/p thrombectomy with 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 Serene with ID recommending IV antibiotics followed by lifelong suppression with doxycycline.  She was admitted for intensive rehab program on 06/07/23 with wound VAC to right groin.    She continued to have issues with pain control, was found to have copious drainage with necrosis of medial aspect of wound as well as ischemia with necrosis of R-AKA incision. She was discharged to acute hospital on 06/16/23 for revision of AKA and I & D of groin wound but continued to have intermittent fevers up to 102 despite IV Vanc as well as issues with pain. Palliative care consulted and patient planned to continue treatment and surgical intervention as needed. She underwent removal of right axillofemoral and femorofemoral bypass on 06/18.  Dr. 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  Dr. 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     Dr. Fredirick consulted for input due on pessary as well as ongoing issues with white discharge which has been treated with diflucan .   She reported that patient with pessaries frequently have increased vaginal discharge can be malodorous and no signs of infection noted. Pessary cleaned and changed out 06/30/23 with decrease in drainage and marked edema/swelling of right labial majora and vaginal canal felt to be related to prior surgery.    Patient's medical record from [facility] has been reviewed by the rehabilitation admission coordinator and physician.  Past Medical History  Past Medical  History:  Diagnosis Date   Cervical polyp 09/18/2018   Critical lower limb ischemia (HCC)    Encounter for screening mammogram for breast cancer 05/05/2016   Fibrocystic breast changes of both breasts 04/29/2015   Hyperlipidemia    Hypertension    PAD (peripheral artery disease) (HCC) 08/23/2019   Pelvic prolapse 06/2020   Pelvic ring in place, needs to be changed every 3 months   Stroke [facility]) 08/23/2019   Has the patient had major surgery during 100 days prior to admission? Yes  Family History  family history includes Heart Problems in her brother, brother, and father.  Current Medications   Current Facility-Administered Medications:    0.9 %  sodium chloride  infusion (Manually program via Guardrails IV Fluids), , Intravenous, Once, Tobie Yetta HERO, MD   acetaminophen  (TYLENOL ) tablet 650 mg, 650 mg, Oral, Q6H PRN, Patel, Pranav M, MD, 650 mg at 07/03/23 0902   alum & mag hydroxide-simeth (MAALOX/MYLANTA) 200-200-20 MG/5ML suspension 15 mL, 15 mL, Oral, Q2H PRN, Patel, Pranav M, MD   amLODipine  (NORVASC ) tablet 10 mg, 10 mg, Oral, Daily, Collins, Emma M, PA-C, 10 mg at 07/04/23 1058   ascorbic acid  (VITAMIN C ) tablet 250 mg, 250 mg, Oral, BID, Collins, Emma M, PA-C, 250 mg at 07/04/23 1059   Chlorhexidine  Gluconate Cloth 2 % PADS 6 each, 6 each, Topical, Daily, Gerome Maurilio HERO, PA-C, 6 each at 07/03/23 9094   Fe Fum-Vit C-Vit B12-FA (TRIGELS-F FORTE) capsule 1 capsule, 1 capsule, Oral, Daily, Patel, Pranav M, MD, 1 capsule at 07/04/23 1111   feeding supplement (ENSURE PLUS HIGH PROTEIN) liquid 237 mL, 237 mL, Oral, BID BM, Collins, Emma M, PA-C, 237 mL at 07/03/23 9093   gabapentin  (NEURONTIN ) capsule 300 mg, 300 mg, Oral, BID, Gerome Maurilio M, PA-C, 300 mg at 07/04/23 1059   guaiFENesin -dextromethorphan (ROBITUSSIN DM) 100-10 MG/5ML syrup 15 mL, 15 mL, Oral, Q4H PRN, Gerome, Emma M, PA-C   hydrALAZINE  (APRESOLINE ) injection 10 mg, 10 mg, Intravenous, Q4H PRN, Patel, Pranav M, MD    hydrALAZINE  (APRESOLINE ) tablet 25 mg, 25 mg, Oral, Q8H, Collins, Emma M, PA-C, 25 mg at 07/04/23 9386   HYDROmorphone  (DILAUDID ) injection 0.5 mg, 0.5 mg, Intravenous, Q3H PRN, Patel, Pranav M, MD, 0.5 mg at 07/03/23 2335   linezolid  (ZYVOX ) tablet 600 mg, 600 mg, Oral, Q12H, Manandhar, Sabina, MD, 600 mg at 07/04/23 1111   metoprolol  succinate (TOPROL -XL) 24 hr tablet 12.5 mg, 12.5 mg, Oral, Daily, Collins, Emma M, PA-C, 12.5 mg at 07/04/23 1100   multivitamin with minerals tablet 1 tablet, 1 tablet, Oral, Daily, Gerome Maurilio M, PA-C, 1 tablet at 07/04/23 1058   ondansetron  (ZOFRAN ) injection 4 mg, 4 mg, Intravenous, Q6H PRN, Parker, Alicia C, NP   oxyCODONE  (Oxy IR/ROXICODONE ) immediate release tablet 5 mg, 5 mg,  Oral, Q6H PRN, 5 mg at 07/02/23 0600 **OR** oxyCODONE  (Oxy IR/ROXICODONE ) immediate release tablet 10 mg, 10 mg, Oral, Q6H PRN, Patel, Pranav M, MD, 10 mg at 07/04/23 0612   oxyCODONE  (OXYCONTIN ) 12 hr tablet 10 mg, 10 mg, Oral, QHS, Collins, Emma M, PA-C, 10 mg at 07/03/23 2130   pantoprazole  (PROTONIX ) EC tablet 40 mg, 40 mg, Oral, Daily, Gerome Herring M, PA-C, 40 mg at 07/04/23 1058   phenol (CHLORASEPTIC) mouth spray 1 spray, 1 spray, Mouth/Throat, PRN, Gerome, Emma M, PA-C   polyethylene glycol (MIRALAX  / GLYCOLAX ) packet 17 g, 17 g, Oral, Daily, Gerome Herring M, PA-C, 17 g at 07/04/23 1108   senna-docusate (Senokot-S) tablet 1 tablet, 1 tablet, Oral, BID, Collins, Emma M, PA-C, 1 tablet at 07/04/23 1058   sodium chloride  flush (NS) 0.9 % injection 3 mL, 3 mL, Intravenous, Q12H, Collins, Emma M, PA-C, 3 mL at 07/03/23 2132  Patients Current Diet:  Diet Order             Diet general           Diet regular Room service appropriate? Yes with Assist; Fluid consistency: Thin  Diet effective now                  Precautions / Restrictions Precautions Precautions: Fall Precaution/Restrictions Comments: Contact precs; wound vac, watch BP Restrictions Weight Bearing  Restrictions Per Provider Order: Yes RLE Weight Bearing Per Provider Order: Non weight bearing   Has the patient had 2 or more falls or a fall with injury in the past year?No  Prior Activity Level Community (5-7x/wk): pta got out of the house daily  Prior Functional Level Prior Function Prior Level of Function : Independent/Modified Independent Mobility Comments: Usually alternates between using SPC and RW ADLs Comments: Pt typically Independent with ADLs; prn for ADLs  Self Care: Did the patient need help bathing, dressing, using the toilet or eating?  Needed some help  Indoor Mobility: Did the patient need assistance with walking from room to room (with or without device)? Needed some help  Stairs: Did the patient need assistance with internal or external stairs (with or without device)? Needed some help  Functional Cognition: Did the patient need help planning regular tasks such as shopping or remembering to take medications? Needed some help  Patient Information Are you of Hispanic, Latino/a,or Spanish origin?: E. Yes, another Hispanic, Latino, or Spanish origin What is your race?: N. Other Pacific Islander Do you need or want an interpreter to communicate with a doctor or health care staff?: 1. Yes  Patient's Response To:  Health Literacy and Transportation Is the patient able to respond to health literacy and transportation needs?: Yes Health Literacy - How often do you need to have someone help you when you read instructions, pamphlets, or other written material from your doctor or pharmacy?: Never In the past 12 months, has lack of transportation kept you from medical appointments or from getting medications?: No In the past 12 months, has lack of transportation kept you from meetings, work, or from getting things needed for daily living?: No  Home Assistive Devices / Equipment Home Equipment: Cane - single point, Agricultural consultant (2 wheels)  Prior Device Use: Indicate  devices/aids used by the patient prior to current illness, exacerbation or injury? Manual wheelchair and Walker  Current Functional Level Cognition  Orientation Level: Oriented X4    Extremity Assessment (includes Sensation/Coordination)  Upper Extremity Assessment: Generalized weakness  Lower Extremity Assessment: Defer to  PT evaluation RLE Deficits / Details: s/p R hip disarticulation    ADLs  Overall ADL's : Needs assistance/impaired Eating/Feeding: Set up, Sitting Grooming: Wash/dry face, Oral care, Set up, Sitting Upper Body Bathing: Set up, Sitting Lower Body Bathing: Contact guard assist, Sit to/from stand Upper Body Dressing : Set up, Sitting Lower Body Dressing: Contact guard assist, Sit to/from stand Lower Body Dressing Details (indicate cue type and reason): pt able to reach LLE to thread pants, in standing fair balance for task Toilet Transfer: Contact guard assist, Stand-pivot, Rolling walker (2 wheels) Toilet Transfer Details (indicate cue type and reason): Simulated in room Toileting- Clothing Manipulation and Hygiene: Sitting/lateral lean, Contact guard assist Toileting - Clothing Manipulation Details (indicate cue type and reason): CGA for balance Functional mobility during ADLs: Contact guard assist, Rolling walker (2 wheels) General ADL Comments: Pt with adequate standing balance to progress ADLs    Mobility  Overal bed mobility: Needs Assistance Bed Mobility: Supine to Sit Supine to sit: Supervision, HOB elevated, Used rails Sit to supine: Min assist General bed mobility comments: increased time, min cues for line awareness and technique, pt utilizing bed features.    Transfers  Overall transfer level: Needs assistance Equipment used: Rolling walker (2 wheels) Transfers: Sit to/from Stand, Bed to chair/wheelchair/BSC Sit to Stand: Min assist Bed to/from chair/wheelchair/BSC transfer type:: Stand pivot Stand pivot transfers: Contact guard assist Squat  pivot transfers: Contact guard assist Step pivot transfers: Min assist General transfer comment: EOB>chair with arm rests, pt using back of LLE for stability with sit>stand, cued for changing her body position to rely less on bed/chair frame as not all chairs have brakes, but pt with limited carryover of cues; variable CGA to light minA with reciprocal STS x 10 reps    Ambulation / Gait / Stairs / Wheelchair Mobility  Ambulation/Gait Ambulation/Gait assistance: Editor, commissioning (Feet): 8 Feet Assistive device: Rolling walker (2 wheels) Gait Pattern/deviations:  (hop-to) General Gait Details: hop to gait pattern with RW and low foot clearance with Min A for balance with backward hopping and intermittent assist for improved RW/proximity to RW. Gait velocity: decreased Gait velocity interpretation: <1.31 ft/sec, indicative of household ambulator    Posture / Balance Dynamic Sitting Balance Sitting balance - Comments: supervision static sitting Balance Overall balance assessment: Needs assistance Sitting-balance support: Feet supported, Single extremity supported, No upper extremity supported Sitting balance-Leahy Scale: Fair Sitting balance - Comments: supervision static sitting Standing balance support: Bilateral upper extremity supported, During functional activity, Reliant on assistive device for balance Standing balance-Leahy Scale: Poor Standing balance comment: requires BUE support for balance and Min A    Special needs/care consideration Needs spanish interpreter     Previous Home Environment  Living Arrangements: Children, Other relatives  Lives With: Family Available Help at Discharge: Family, Available 24 hours/day Type of Home: Mobile home Home Layout: One level Home Access: Ramped entrance, Stairs to enter Entrance Stairs-Rails: Right, Left, Can reach both Entrance Stairs-Number of Steps: 5 Bathroom Shower/Tub: Sponge bathes at baseline Bathroom Toilet:  Standard Bathroom Accessibility: Yes How Accessible: Accessible via walker Home Care Services:  ([facility] had been arrnaged by CIR) Additional Comments: Pt admitted from CIR, progressing towards Mod I at w/c level  Discharge Living Setting Type of Home at Discharge: Mobile home Discharge Home Layout: One level Discharge Home Access: Ramped entrance Discharge Bathroom Shower/Tub: Tub/shower unit Discharge Bathroom Toilet: Standard Discharge Bathroom Accessibility: Yes How Accessible: Accessible via walker Does the patient have any problems obtaining  your medications?: No  Social/Family/Support Systems Contact Information: Melody, grand daughter and daughter, [name] Anticipated Caregiver: Melody and Architect Information: see contacts Ability/Limitations of Caregiver: Melody can provide assist Caregiver Availability: 24/7 Discharge Plan Discussed with Primary Caregiver: Yes Is Caregiver In Agreement with Plan?: Yes Does Caregiver/Family have Issues with Lodging/Transportation while Pt is in Rehab?: No  Goals Patient/Family Goal for Rehab: supervision with PT and OT Expected length of stay: ELOS 7 to 10 days Additional Information: recent CIR admit 6/4 until 06/16/23 and then admitted to acute for surgical plan Pt/Family Agrees to Admission and willing to participate: Yes Program Orientation Provided & Reviewed with Pt/Caregiver Including Roles  & Responsibilities: Yes  Decrease burden of Care through IP rehab admission: n/a  Possible need for SNF placement upon discharge:not anticipated  Patient Condition: This patient's condition remains as documented in the consult dated 06/29/23, in which the Rehabilitation Physician determined and documented that the patient's condition is appropriate for intensive rehabilitative care in an inpatient rehabilitation facility. Will admit to inpatient rehab today.  Preadmission Screen Completed By:  Alison Heron Lot, RN MSN, 07/04/2023 11:18 AM ______________________________________________________________________   Discussed status with Dr. Jaevin Medearis on 07/04/23 at 1117 and received approval for admission today.  Admission Coordinator:  Alison Heron Lot, RN MSN time [id number] Date 07/04/23

## 2023-06-29 NOTE — Progress Notes (Signed)
 Physical Therapy Treatment Patient Details Name: Katherine Archer MRN: [id number] DOB: 05-15-1955 Today's Date: 06/29/2023   History of Present Illness Pt is a 68 y/o F admitted from CIR to acute services on 06/16/23. 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 Pt underwent R groin I&D & R AKA revision on 06/16/23, removal of R axillofemoral & femorofemoral bypass on 06/21/23. Pt underwent R hip disarticulation on 06/28/23. PMH: HLD, HTN, PAD, stroke    PT Comments  Pt is progressing towards goals. Currently pt is supervision for bed mobility and CGA for squat pivot transfers. Pt as fatigued today due to low hgb. Deferred further mobility due to fatigue at this time. Due to pt current functional status, home set up and available assistance at home recommending skilled physical therapy services > 3 hours/day in order to address strength, balance and functional mobility to decrease risk for falls, injury, immobility, skin break down and re-hospitalization.      If plan is discharge home, recommend the following: A lot of help with walking and/or transfers;Assist for transportation;Help with stairs or ramp for entrance;Assistance with Systems developer cushion (measurements PT);Wheelchair (measurements PT);BSC/3in1       Precautions / Restrictions Precautions Precautions: Fall Recall of Precautions/Restrictions: Intact Precaution/Restrictions Comments: wound vac Restrictions Weight Bearing Restrictions Per Provider Order: Yes RLE Weight Bearing Per Provider Order: Non weight bearing     Mobility  Bed Mobility Overal bed mobility: Needs Assistance Bed Mobility: Supine to Sit     Supine to sit: Supervision, HOB elevated, Used rails          Transfers Overall transfer level: Needs assistance Equipment used: None Transfers: Bed to  chair/wheelchair/BSC       Squat pivot transfers: Contact guard assist     General transfer comment: CGA for squat pivot transfer with 3 squat pivots and occasional need for verbal/tactile/visual cues for safe hand placement.    Ambulation/Gait     General Gait Details: deferred today       Balance Overall balance assessment: Needs assistance Sitting-balance support: Feet supported, Bilateral upper extremity supported Sitting balance-Leahy Scale: Fair Sitting balance - Comments: supervision static sitting        Communication Communication Communication: No apparent difficulties;Other (comment) (video interpreter utilized)  Cognition Arousal: Alert Behavior During Therapy: WFL for tasks assessed/performed   PT - Cognitive impairments: No apparent impairments       Following commands: Intact      Cueing Cueing Techniques: Verbal cues     General Comments General comments (skin integrity, edema, etc.): Pt has wound vac on R surgical area.      Pertinent Vitals/Pain Pain Assessment Pain Assessment: Faces Faces Pain Scale: Hurts little more Pain Location: R hip/surgical site Pain Descriptors / Indicators: Grimacing, Guarding, Discomfort Pain Intervention(s): Monitored during session, Limited activity within patient's tolerance     PT Goals (current goals can now be found in the care plan section) Acute Rehab PT Goals Patient Stated Goal: get better, eventually go home PT Goal Formulation: With patient/family Time For Goal Achievement: 07/12/23 Potential to Achieve Goals: Fair Progress towards PT goals: Progressing toward goals    Frequency    Min 4X/week      PT Plan  Continue with current POC        AM-PAC PT 6 Clicks Mobility   Outcome Measure  Help needed  turning from your back to your side while in a flat bed without using bedrails?: A Little Help needed moving from lying on your back to sitting on the side of a flat bed without using  bedrails?: A Little Help needed moving to and from a bed to a chair (including a wheelchair)?: A Little Help needed standing up from a chair using your arms (e.g., wheelchair or bedside chair)?: A Lot Help needed to walk in hospital room?: Total Help needed climbing 3-5 steps with a railing? : Total 6 Click Score: 13    End of Session Equipment Utilized During Treatment: Gait belt Activity Tolerance: Patient limited by fatigue;Patient tolerated treatment well Patient left: in chair;with call bell/phone within reach Nurse Communication: Mobility status PT Visit Diagnosis: Difficulty in walking, not elsewhere classified (R26.2);Other abnormalities of gait and mobility (R26.89);Muscle weakness (generalized) (M62.81) Pain - Right/Left: Right Pain - part of body: Hip     Time: 1206-1230 PT Time Calculation (min) (ACUTE ONLY): 24 min  Charges:    $Therapeutic Activity: 23-37 mins PT General Charges $$ ACUTE PT VISIT: 1 Visit                     [name], DPT, CLT  Acute Rehabilitation Services Office: [phone] (Secure chat preferred)    [name] 06/29/2023, 12:58 PM

## 2023-06-29 NOTE — Consult Note (Incomplete Revision)
 I have seen and examined the patient. I have personally reviewed the clinical findings, laboratory findings, microbiological data and imaging studies. The assessment and treatment plan was discussed with the Nurse Practitioner. I agree with her/his recommendations except following additions/corrections.       [facility]    Date of Admission:  06/16/2023     Total days of antibiotics 32               Reason for Consult: Vascular Graft Infection / Group A Streptococcus Bacteremia   Referring Provider: Dr. 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 POD #1 and pain adequately controlled. Left groin was without infection. Suspect source control achieved with surgical intervention. MRSA was from abscess culture and not in blood stream with Group A Streptococcus. Will confirm source control with Orthopedics and consider remaining duration of antibiotics. For now, continue with current dose of vancomycin . Therapeutic drug monitoring of renal function and vancomycin  levels. Contact precautions. Post-operative wound care per Orthopedics. Remaining medical and supportive care per Internal Medicine.   PLAN:  Continue current dose of vancomycin . Post-operative wound care per Orthopedics.  Therapeutic drug monitoring of renal function and vancomycin  levels.  PICC line care per protocol.  Contact precautions for MRSA. Remaining medical and supportive care per Internal Medicine.    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 (HCC)   Atherosclerosis of native arteries of extremities with gangrene, right leg (HCC)    sodium chloride    Intravenous Once   amLODipine   10 mg Oral Daily   ascorbic acid   250 mg Oral BID   Chlorhexidine  Gluconate Cloth  6 each Topical Daily   feeding supplement  237 mL Oral BID BM   fluconazole  100 mg Oral Daily   gabapentin   300 mg Oral BID   hydrALAZINE   25 mg Oral Q8H   metoprolol  succinate  12.5 mg Oral Daily   multivitamin with minerals  1 tablet Oral Daily   oxyCODONE   10 mg Oral QHS   pantoprazole   40 mg Oral Daily   polyethylene glycol  17 g Oral Daily   senna-docusate  1 tablet Oral BID   sodium chloride  flush  3 mL Intravenous Q12H     HPI: Katherine Archer is a 68 y.o. female with previous medical history of hypertension peripheral artery disease status post redo common femoral endarterectomy in 2024 and thrombectomy in May 2025 presenting to the hospital with worsening right lower extremity pain, swelling, and purulent drainage from the wound site and found to have group A streptococcus bacteremia with abscess culture growing MRSA in the setting of postsurgical/graft infection.  Katherine Archer was last seen by the ID team on 06/01/2023 in the setting of group A streptococcus vascular graft infection and bacteremia with plan for 6 weeks of daptomycin  with course complicated by development of increasing CK levels with daptomycin  being discontinued and vancomycin  restarted with initial end date of 07/12/2023.  While in CIR she was noted to have increasing right above-knee amputation stump pain and on  evaluation appearing more necrotic with some drainage.  Vascular surgery had concern for nonhealing of her groin with ischemic changes to her above-knee amputation site.  Brought to the OR on 06/16/2023.  There were no signs of obvious infection.  The nonviable portion of her above-knee amputation was removed with a higher above-knee amputation being performed.  She  continued on vancomycin .  Course was complicated by a solitary fever of 102.1 F on 06/17/2023 and continued purulent drainage.  Plan was to remove her graft in her right groin and will be left with an ischemic left leg so a left axillary femoral graft would be placed.  Brought to the OR on 06/21/2023 with no evidence of infection in the left groin.  The right groin incision was opened and the remaining graft was dissected free and was encased with purulent material.  There was also purulent material noted at her above-knee amputation site.  Following discussion and evaluation by orthopedics decision was made for right hip disarticulation secondary to gangrene right above-knee amputation.  Katherine Archer is postop day #1 from right hip disarticulation and wound VAC placement.  Left lower extremity well-perfused and left groin healing well.  No further fevers.  No additional cultures were obtained.  She continues on vancomycin .  ID has been consulted for antibiotic recommendations.  Katherine Archer primary preferred language is Spanish and a medical interpreter is present via phone to aid in communication. Doing okay and pain is adequately controlled with medication. Tolerating antibiotics with no adverse side effects. PICC line right upper extremity. No new concerns/complaints.   Review of Systems: Review of Systems  Constitutional:  Negative for chills, fever and weight loss.  Respiratory:  Negative for cough, shortness of breath and wheezing.   Cardiovascular:  Negative for chest pain and leg swelling.  Gastrointestinal:  Negative for abdominal pain, constipation, diarrhea, nausea and vomiting.  Skin:  Negative for rash.     Past Medical History:  Diagnosis Date   Cervical polyp 09/18/2018   Critical lower limb ischemia (HCC)    Encounter for screening mammogram for breast cancer 05/05/2016   Fibrocystic breast changes of both breasts 04/29/2015   Hyperlipidemia    Hypertension    PAD (peripheral artery  disease) (HCC) 08/23/2019   Pelvic prolapse 06/2020   Pelvic ring in place, needs to be changed every 3 months   Stroke (HCC) 08/23/2019   Past Surgical History:  Procedure Laterality Date   ABDOMINAL AORTOGRAM W/LOWER EXTREMITY Bilateral 08/23/2019   Procedure: ABDOMINAL AORTOGRAM W/ Bilateral LOWER EXTREMITY Run[place];  Surgeon: Harvey Carlin BRAVO, MD;  Location: [facility] INVASIVE CV LAB;  Service: Cardiovascular;  Laterality: Bilateral;   AMPUTATION Right 05/31/2023   Procedure: AMPUTATION, ABOVE KNEE SARTORIOUS MUSCLE FLAP;  Surgeon: Serene Gaile ORN, MD;  Location: [facility] OR;  Service: Vascular;  Laterality: Right;   APPLICATION OF WOUND VAC Right 05/31/2023   Procedure: APPLICATION, WOUND VAC;  Surgeon: Serene Gaile ORN, MD;  Location: [facility] OR;  Service: Vascular;  Laterality: Right;   APPLICATION OF WOUND VAC Right 06/16/2023   Procedure: APPLICATION, WOUND VAC;  Surgeon: Serene Gaile ORN, MD;  Location: [facility] OR;  Service: Vascular;  Laterality: Right;   APPLICATION OF WOUND VAC Right 06/21/2023   Procedure: APPLICATION, WOUND VAC;  Surgeon: Serene Gaile ORN, MD;  Location: [facility] OR;  Service: Vascular;  Laterality: Right;   APPLICATION, SKIN SUBSTITUTE Right 05/12/2023   Procedure: APPLICATION, SKIN SUBSTITUTE KERECIS 38SQ CM;  Surgeon: Serene Gaile ORN, MD;  Location: [facility] OR;  Service: Vascular;  Laterality: Right;   APPLICATION, SKIN SUBSTITUTE Right 06/16/2023   Procedure: APPLICATION, SKIN SUBSTITUTE;  Surgeon: Serene Gaile ORN, MD;  Location: [facility];  Service: Vascular;  Laterality: Right;   APPLICATION, SKIN SUBSTITUTE Right 06/21/2023   Procedure: APPLICATION, SKIN SUBSTITUTE;  Surgeon: Serene Gaile ORN, MD;  Location: [facility];  Service: Vascular;  Laterality: Right;   AVGG REMOVAL Right 05/29/2023   Procedure: REMOVAL BYPASS GRAFT RIGHT LEG;  Surgeon: Lanis Fonda BRAVO, MD;  Location: [facility] OR;  Service: Vascular;  Laterality: Right;   AXILLARY-FEMORAL BYPASS GRAFT Right 11/11/2019   Procedure: BYPASS GRAFT RIGHT  AXILLA-BIFEMORAL;  Surgeon: Harvey Carlin BRAVO, MD;  [facility]: [facility];  Service: Vascular;  Laterality: Right;   AXILLARY-FEMORAL BYPASS GRAFT Left 06/21/2023   Procedure: CREATION, BYPASS, ARTERIAL, LEFT AXILLARY TO LEFT FEMORAL, USING GRAFT;  Surgeon: Serene Gaile ORN, MD;  Location: [facility];  Service: Vascular;  Laterality: Left;   BRAIN SURGERY     BREAST SURGERY Bilateral 2014   1980 R breast surgery   CEREBRAL ANEURYSM REPAIR     ENDARTERECTOMY FEMORAL Bilateral 11/11/2019   Procedure: ENDARTERECTOMY FEMORAL BILATERALLY;  Surgeon: Harvey Carlin BRAVO, MD;  Location[facility] OR;  Service: Vascular;  Laterality: Bilateral;   ENDARTERECTOMY FEMORAL Right 01/26/2022   Procedure: REDO RIGHT COMMON FEMORAL ENDARTERECTOMY USING 0.8CM X 7.6CM HEMASHIELD PLATINUM FINESSE PATCH;  Surgeon: Serene Gaile ORN, MD;  [facility];  Service: Vascular;  Laterality: Right;   FEMORAL-POPLITEAL BYPASS GRAFT Right 01/26/2022   Procedure: RIGHT FEMORAL-POPLITEAL BYPASS GRAFT USING GORE X 80CM PROPATEN GRAFT;  Surgeon: Serene Gaile ORN, MD;  [facility];  Service: Vascular;  Laterality: Right;   FEMORAL-POPLITEAL BYPASS GRAFT Right 05/12/2023   Procedure: BYPASS GRAFT FEMORAL-POPLITEAL ARTERY;  Surgeon: Serene Gaile ORN, MD;  Location: [facility] OR;  Service: Vascular;  Laterality: Right;   HIP DISARTICULATION Right 06/28/2023   Procedure: MARGA, HIP;  Surgeon: Harden Jerona GAILS, MD;  Location: [facility] OR;  Service: Orthopedics;  Laterality: Right;   INCISION AND DRAINAGE OF WOUND Right 05/31/2023   Procedure: IRRIGATION AND DEBRIDEMENT WOUND;  Surgeon: Serene Gaile ORN, MD;  [facility];  Service: Vascular;  Laterality: Right;  RIGHT GROIN WASHOUT   INCISION AND DRAINAGE OF WOUND Right 06/16/2023   Procedure: IRRIGATION AND DEBRIDEMENT WOUND;  Surgeon: Serene Gaile ORN, MD;  Location: [facility];  Service: Vascular;  Laterality: Right;  Right Groin   INCISION AND DRAINAGE OF WOUND Right 06/21/2023   Procedure: IRRIGATION AND  DEBRIDEMENT OF RIGHT GROIN AND RIGHT ABOVE KNEE AMPUTATION;  Surgeon: Serene Gaile ORN, MD;  Location: [facility];  Service: Vascular;  Laterality: Right;   IR RADIOLOGIST EVAL & MGMT  04/02/2020   IR RADIOLOGIST EVAL & MGMT  03/29/2021   REMOVAL OF GRAFT Right 06/21/2023   Procedure: REMOVAL, GRAFT, RIGHT AXILLARY BIFEMORAL GRAFT;  Surgeon: Serene Gaile ORN, MD;  [facility];  Service: Vascular;  Laterality: Right;   REVISION AMPUTATION, BELOW THE KNEE Right 06/16/2023   Procedure: REVISION AMPUTATION, ABOVE THE KNEE;  Surgeon: Serene Gaile ORN, MD;  [facility];  Service: Vascular;  Laterality: Right;   THROMBECTOMY FEMORAL ARTERY Right 01/26/2022   Procedure: THROMBECTOMY RIGHT FEMORAL ARTERY;  Surgeon: Serene Gaile ORN, MD;  Location: [facility];  Service: Vascular;  Laterality: Right;   THROMBECTOMY OF BYPASS GRAFT FEMORAL- TIBIAL ARTERY Right 05/12/2023   Procedure: THROMBECTOMY, BYPASS GRAFT, ARTERIAL;  Surgeon: Serene Gaile ORN, MD;  Location: [facility] OR;  Service: Vascular;  Laterality: Right;   TUBAL LIGATION  1990     Social History   Tobacco Use   Smoking status: Former    Current packs/day: 0.00    Types: Cigarettes    Quit date: 10/04/2019    Years since quitting: 3.7    Passive exposure: Never   Smokeless tobacco: Never  Vaping Use   Vaping status: Never Used  Substance Use Topics   Alcohol  use: No   Drug use: No    Family History  Problem Relation Age of Onset   Heart Problems Father    Heart Problems Brother    Heart Problems Brother     Allergies  Allergen Reactions   Penicillins Other (See Comments)    Shaky and Dizziness 20 years ago    OBJECTIVE: Blood pressure (!) 123/45, pulse 60, temperature 98.1 F (36.7 C), temperature source Oral, resp. rate 19, height (P) 5' 6 (1.676 m), weight (P) 60.3 kg, SpO2 100%.  Physical Exam Constitutional:      General: She is not in acute distress.    Appearance: She is well-developed.   Cardiovascular:     Rate and  Rhythm: Normal rate and regular rhythm.     Heart sounds: Normal heart sounds.  Pulmonary:     Effort: Pulmonary effort is normal.     Breath sounds: Normal breath sounds.   Skin:    General: Skin is warm and dry.   Neurological:     Mental Status: She is alert and oriented to person, place, and time.   Psychiatric:        Mood and Affect: Mood normal.     Lab Results Lab Results  Component Value Date   WBC 21.6 (H) 06/29/2023   HGB 8.3 (L) 06/29/2023   HCT 26.2 (L) 06/29/2023   MCV 93.1 06/29/2023   PLT 369 06/29/2023    Lab Results  Component Value Date   CREATININE 0.49 06/29/2023   BUN 8 06/29/2023   NA 132 (L) 06/29/2023   K 4.4 06/29/2023   CL 98 06/29/2023   CO2 25 06/29/2023    Lab Results  Component Value Date   ALT 35 06/19/2023   AST 53 (H) 06/19/2023   ALKPHOS 63 06/19/2023   BILITOT 0.6 06/19/2023     Microbiology: Recent Results (from the past 240 hours)  MRSA Next Gen by PCR, Nasal     Status: None   Collection Time: 06/27/23  4:43 PM   Specimen: Nasal Mucosa; Nasal Swab  Result Value Ref Range Status   MRSA by PCR Next Gen NOT DETECTED NOT DETECTED Final    Comment: (NOTE) The GeneXpert MRSA Assay (FDA approved for NASAL specimens only), is one component of a comprehensive MRSA colonization surveillance program. It is not intended to diagnose MRSA infection nor to guide or monitor treatment for MRSA infections. Test performance is not FDA approved in patients less than 60 years old. Performed at [facility], [address] [place], [place] [address]    Imaging DG Abd Portable 1V Result Date: 06/28/2023 CLINICAL DATA:  Abdominal pain and distention. EXAM: PORTABLE ABDOMEN - 1 VIEW COMPARISON:  Aorto bi-iliac CTA dated 05/29/2023. FINDINGS: Interval amputation of the right leg with no bone destruction or periosteal reaction involving the pelvic bones. No soft tissue gas. Right pelvic skin clips. Normal bowel-gas pattern. Right lower  skin clips. Pessary. IMPRESSION: 1. Interval amputation of the right leg. 2. No acute abnormality. Electronically Signed   By: Elspeth  Robynn M.D.   On: 06/28/2023 15:28   US  EKG Site Rite Result Date: 06/14/2023 If Site Rite image not attached, placement could not be confirmed due to current cardiac rhythm.  US  EKG SITE RITE Result Date: 06/01/2023 If Site Rite image not attached, placement could not be confirmed due to current cardiac rhythm.   I have personally spent 30 minutes involved in face-to-face and non-face-to-face activities for this patient on the day of the visit. Professional time spent includes the following activities: preparing to see the patient (review of tests), obtaining and reviewing separately obtained history (admission/discharge record), performing a medically appropriate examination, ordering medications, communicating with other health care professionals, documenting clinical information in the EMR, communicating results and counseling patient regarding medication and plan of care, and care coordination.    Greg Calone, NP [facility]  06/29/2023  1:42 PM

## 2023-06-29 NOTE — Progress Notes (Signed)
 Mobility Specialist Progress Note:    06/29/23 1456  Mobility  Activity Transferred from chair to bed  Level of Assistance Contact guard assist, steadying assist  Assistive Device Front wheel walker  Distance Ambulated (ft) 2 ft  Activity Response Tolerated well  Mobility Referral Yes  Mobility visit 1 Mobility  Mobility Specialist Start Time (ACUTE ONLY) 1445  Mobility Specialist Stop Time (ACUTE ONLY) 1455  Mobility Specialist Time Calculation (min) (ACUTE ONLY) 10 min   Pt received in chair, requesting assistance to transfer back to bed. Required CGA to stand and pivot with RW to bed. Tolerated well, asx throughout. Left with all needs met. Family in room.   Kamaria Lucia Mobility Specialist Please contact via Special educational needs teacher or  Rehab office at [phone]

## 2023-06-29 NOTE — Progress Notes (Signed)
 OT Cancellation Note  Patient Details Name: Katherine Archer MRN: [id number] DOB: 01-17-1955   Cancelled Treatment:    Reason Eval/Treat Not Completed: Medical issues which prohibited therapy (Per nursing got a call due to drop in Hgb and to wait till pm to check back in. Will continue to follow.)  Merick Kelleher K OTR/L  Acute Rehab Services  [phone] office number   Warrick Berber 06/29/2023, 8:12 AM

## 2023-06-29 NOTE — Progress Notes (Signed)
 Inpatient Rehabilitation Admissions Coordinator   I await OT eval to begin Auth for possible readmit to CIR. Please note Dr Winona rehab consult.  Heron Leavell, RN, MSN Rehab Admissions Coordinator [phone] 06/29/2023 2:02 PM

## 2023-06-29 NOTE — Progress Notes (Signed)
 [facility] Progress Note Patient: Katherine Archer FMW:[id number] DOB: 1955-10-22 DOA: 06/16/2023  DOS: the patient was seen and examined on 06/29/2023  Brief Hospital Course: PMH of HTN, PAD, HLD, CVA, presented with wound dehiscence. Vascular surgery as well as orthopedic surgery were consulted.  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 Underwent AKA and hip disarticulation. Currently on IV antibiotic.  ID consulted.  Appreciate assistance. Monitor.  PAD. Vascular surgery following. Underwent left common femoral artery bypass graft and I&D of the right AKA. Currently has a wound VAC.  Postop nausea. X-ray abdomen unremarkable.  Vaginal candidiasis. Initiating Diflucan.  Vaginal pessary. Falling  behind on exchange. Appreciate GYN consultation. Outpatient follow-up recommended.  HTN. Blood pressure stable. Continue home regimen.  Anemia secondary to acute blood loss. Monitor H&H. Transfuse for hemoglobin less than 8.  Patient has provided consent.  Initially ordered blood transfusion although on a recheck hemoglobin remained appropriately elevated to 8.3.   Subjective: No nausea no vomiting.  No bleeding.  No fever no chills.  Pain well-controlled.  Physical Exam: General: in Mild distress, No Rash Cardiovascular: S1 and S2 Present, No Murmur Respiratory: Good respiratory effort, Bilateral Air entry present. No Crackles, No wheezes Abdomen: Bowel Sound present, No tenderness Extremities: No edema, right AKA Neuro: Alert and oriented x3, no new focal deficit  Data Reviewed: I have Reviewed nursing notes, Vitals, and Lab results. Since last encounter, pertinent lab results CBC and BMP   . I have ordered test including CBC and BMP  . I have discussed pt's care plan and test results with discussed with ID and GYN  .   Disposition: Status is: Inpatient Remains inpatient appropriate because: Continue IV antibiotics  SCD's Start:  06/28/23 1155   Family Communication: No one at bedside Level of care: Progressive   Vitals:   06/29/23 1138 06/29/23 1339 06/29/23 1454 06/29/23 1455  BP: 92/64 (!) 123/45  (!) 150/51  Pulse: 60  78   Resp: 19   16  Temp: 98.1 F (36.7 C)  98.4 F (36.9 C)   TempSrc: Oral  Oral   SpO2: 100%  100%   Weight:      Height:         Author: Yetta Blanch, MD 06/29/2023 7:21 PM  Please look on [email or web address] to find out who is on call.

## 2023-06-29 NOTE — Consult Note (Addendum)
 I have seen and examined the patient. I have personally reviewed the clinical findings, laboratory findings, microbiological data and imaging studies. The assessment and treatment plan was discussed with the Nurse Practitioner. I agree with her/his recommendations except following additions/corrections.  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 Not in acute distress, HEENT wnl. Heart, lung and abdomen wnl. Rt hip with a wound vac. Left Lower extremity ok with no concerns for infection. Awake, alert and oriented, non focal exam. No rashes  Complete planned 6 weeks course of vancomycin  as planned to cover empiric treatment for MRSA endocarditis although negative blood cx. EOT 7/9. If planned for discharge or any iv issues, can be switched to PO linezolid  to complete remainder of the course through 7/9.  Monitor CBC, BMP and CPK Maintain contact precautions  ID will so, recall back with questions or concerns.   I spent 80 minutes involved in face-to-face and non-face-to-face activities for this patient on the day of the visit. Professional time spent includes the following activities: Preparing to see the patient (review of tests), Obtaining and reviewing separately obtained history (admission/discharge record), Performing a medically appropriate examination and evaluation , Ordering medications/labs, referring and communicating with other health care professionals, Documenting clinical information in the EMR, Independently interpreting results  (not separately reported), Communicating results to the patient/family/caregiver, Counseling and educating the patient/family/caregiver and Care coordination (not separately reported).      [facility]    Date of Admission:  06/16/2023     Total days of antibiotics 32               Reason for Consult: Vascular Graft Infection / Group A Streptococcus Bacteremia   Referring Provider: Dr. 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 POD #1 and pain adequately controlled. Left groin was without infection. Suspect source control achieved with surgical intervention. MRSA was from abscess culture and not in blood stream with Group A Streptococcus. Will confirm source control with Orthopedics and consider remaining duration of antibiotics. For now, continue with current dose of vancomycin . Therapeutic drug monitoring of renal function and vancomycin  levels. Contact precautions. Post-operative wound care per Orthopedics. Remaining medical and supportive care per Internal Medicine.   PLAN:  Continue current dose of vancomycin . Post-operative wound care per Orthopedics.  Therapeutic drug monitoring of renal function and vancomycin  levels.  PICC line care per protocol.  Contact precautions for MRSA. Remaining medical and supportive care per Internal Medicine.    Principal Problem:   Surgical site infection Active Problems:   Dehiscence of amputation stump of right lower extremity (HCC)   Atherosclerosis of native arteries of extremities with  gangrene, right leg (HCC)     sodium chloride    Intravenous Once   amLODipine   10 mg Oral Daily   ascorbic acid   250 mg Oral BID   Chlorhexidine  Gluconate Cloth  6 each Topical Daily   feeding supplement  237 mL Oral BID BM   fluconazole   100 mg Oral Daily   gabapentin   300 mg Oral BID   hydrALAZINE   25 mg Oral Q8H   metoprolol  succinate  12.5 mg Oral Daily   multivitamin with minerals  1 tablet Oral Daily   oxyCODONE   10 mg Oral QHS   pantoprazole   40 mg Oral Daily   polyethylene glycol  17 g Oral Daily   senna-docusate  1 tablet Oral BID   sodium chloride  flush  3 mL Intravenous Q12H     HPI: Katherine Archer is a 68 y.o. female with previous medical history of hypertension peripheral artery disease status post redo common femoral endarterectomy in 2024 and thrombectomy in May 2025 presenting to the hospital with worsening right lower extremity pain, swelling, and purulent drainage from the wound site and found to have group A streptococcus bacteremia with abscess culture growing MRSA in the setting of postsurgical/graft infection.  Katherine Archer was last seen by the ID team on 06/01/2023 in the setting of group A streptococcus vascular graft infection and bacteremia with plan for 6 weeks of daptomycin  with course complicated by development of increasing CK levels with daptomycin  being discontinued and vancomycin  restarted with initial end date of 07/12/2023.  While in CIR she was noted to have increasing right above-knee amputation stump pain and on evaluation appearing more necrotic with some drainage.  Vascular surgery had concern for nonhealing of her groin with ischemic changes to her above-knee amputation site.  Brought to the OR on 06/16/2023.  There were no signs of obvious infection.  The nonviable portion of her above-knee amputation was removed with a higher above-knee amputation being performed.  She continued on vancomycin .  Course was complicated by a solitary fever of 102.1 F on 06/17/2023 and continued purulent drainage.   Plan was to remove her graft in her right groin and will be left with an ischemic left leg so a left axillary femoral graft would be placed.  Brought to the OR on 06/21/2023 with no evidence of infection in the left groin.  The right groin incision was opened and the remaining graft was dissected free and was encased with purulent material.  There was also purulent material noted at her above-knee amputation site.  Following discussion and evaluation by orthopedics decision was made for right hip disarticulation secondary to gangrene right above-knee amputation.  Katherine Archer is postop day #1 from right hip disarticulation and wound VAC placement.  Left lower extremity well-perfused and left groin healing well.  No further fevers.  No additional cultures were obtained.  She continues on vancomycin .  ID has been consulted for antibiotic recommendations.  Katherine Archer primary preferred language is Spanish and a medical interpreter is present via phone to aid in communication. Doing okay and pain is adequately controlled with medication. Tolerating antibiotics with no adverse side effects. PICC line right upper extremity. No new concerns/complaints.   Review of Systems: Review of Systems  Constitutional:  Negative for chills, fever and weight loss.  Respiratory:  Negative for cough, shortness of breath and wheezing.   Cardiovascular:  Negative for chest pain and leg swelling.  Gastrointestinal:  Negative for abdominal pain, constipation, diarrhea, nausea and vomiting.  Skin:  Negative for rash.     Past Medical History:  Diagnosis Date   Cervical polyp 09/18/2018   Critical lower limb ischemia (HCC)    Encounter for screening mammogram for breast cancer 05/05/2016   Fibrocystic breast changes of both breasts 04/29/2015   Hyperlipidemia    Hypertension    PAD (peripheral artery disease) (HCC) 08/23/2019   Pelvic prolapse 06/2020   Pelvic ring in place, needs to be changed every 3 months   Stroke  (HCC) 08/23/2019   Past Surgical History:  Procedure Laterality Date   ABDOMINAL AORTOGRAM W/LOWER EXTREMITY Bilateral 08/23/2019   Procedure: ABDOMINAL AORTOGRAM W/ Bilateral LOWER EXTREMITY Run[place];  Surgeon: Harvey Carlin BRAVO, MD;  Location: [facility] INVASIVE CV LAB;  Service: Cardiovascular;  Laterality: Bilateral;   AMPUTATION Right 05/31/2023   Procedure: AMPUTATION, ABOVE KNEE SARTORIOUS MUSCLE FLAP;  Surgeon: Serene Gaile ORN, MD;  Location: [facility] OR;  Service: Vascular;  Laterality: Right;   APPLICATION OF WOUND VAC Right 05/31/2023   Procedure: APPLICATION, WOUND VAC;  Surgeon: Serene Gaile ORN, MD;  Location: [facility] OR;  Service: Vascular;  Laterality: Right;   APPLICATION OF WOUND VAC Right 06/16/2023   Procedure: APPLICATION, WOUND VAC;  Surgeon: Serene Gaile ORN, MD;  Location: [facility] OR;  Service: Vascular;  Laterality: Right;   APPLICATION OF WOUND VAC Right 06/21/2023   Procedure: APPLICATION, WOUND VAC;  Surgeon: Serene Gaile ORN, MD;  Location: [facility] OR;  Service: Vascular;  Laterality: Right;   APPLICATION, SKIN SUBSTITUTE Right 05/12/2023   Procedure: APPLICATION, SKIN SUBSTITUTE KERECIS 38SQ CM;  Surgeon: Serene Gaile ORN, MD;  Location: [facility] OR;  Service: Vascular;  Laterality: Right;   APPLICATION, SKIN SUBSTITUTE Right 06/16/2023   Procedure: APPLICATION, SKIN SUBSTITUTE;  Surgeon: Serene Gaile ORN, MD;  Location: [facility] OR;  Service: Vascular;  Laterality: Right;   APPLICATION, SKIN SUBSTITUTE Right 06/21/2023   Procedure: APPLICATION, SKIN SUBSTITUTE;  Surgeon: Serene Gaile ORN, MD;  Location: [facility] OR;  Service: Vascular;  Laterality: Right;   AVGG REMOVAL Right 05/29/2023   Procedure: REMOVAL BYPASS GRAFT RIGHT LEG;  Surgeon: Lanis Fonda BRAVO, MD;  Location: [facility] OR;  Service: Vascular;  Laterality: Right;   AXILLARY-FEMORAL BYPASS GRAFT Right 11/11/2019   Procedure: BYPASS GRAFT RIGHT AXILLA-BIFEMORAL;  Surgeon: Harvey Carlin BRAVO, MD;  Location: [facility] OR;  Service: Vascular;  Laterality: Right;    AXILLARY-FEMORAL BYPASS GRAFT Left 06/21/2023   Procedure: CREATION, BYPASS, ARTERIAL, LEFT AXILLARY TO LEFT FEMORAL, USING GRAFT;  Surgeon: Serene Gaile ORN, MD;  Location: [facility] OR;  Service: Vascular;  Laterality: Left;   BRAIN SURGERY     BREAST SURGERY Bilateral 2014   1980 R breast surgery   CEREBRAL ANEURYSM REPAIR     ENDARTERECTOMY FEMORAL Bilateral 11/11/2019   Procedure: ENDARTERECTOMY FEMORAL BILATERALLY;  Surgeon: Harvey Carlin BRAVO, MD;  Location: [facility] OR;  Service: Vascular;  Laterality: Bilateral;   ENDARTERECTOMY FEMORAL Right 01/26/2022   Procedure: REDO RIGHT COMMON FEMORAL ENDARTERECTOMY USING 0.8CM X 7.6CM HEMASHIELD PLATINUM FINESSE PATCH;  Surgeon: Serene Gaile ORN, MD;  Location: [facility] OR;  Service: Vascular;  Laterality: Right;   FEMORAL-POPLITEAL BYPASS GRAFT Right 01/26/2022   Procedure: RIGHT FEMORAL-POPLITEAL BYPASS GRAFT USING GORE X 80CM PROPATEN GRAFT;  Surgeon: Serene Gaile ORN, MD;  Location: [facility] OR;  Service: Vascular;  Laterality: Right;   FEMORAL-POPLITEAL BYPASS GRAFT Right 05/12/2023   Procedure: BYPASS GRAFT FEMORAL-POPLITEAL ARTERY;  Surgeon: Serene Gaile ORN, MD;  Location: [facility];  Service: Vascular;  Laterality: Right;   HIP  DISARTICULATION Right 06/28/2023   Procedure: DISARTICULATION, HIP;  Surgeon: Harden Jerona GAILS, MD;  Location: [facility] OR;  Service: Orthopedics;  Laterality: Right;   INCISION AND DRAINAGE OF WOUND Right 05/31/2023   Procedure: IRRIGATION AND DEBRIDEMENT WOUND;  Surgeon: Serene Gaile ORN, MD;  Location: [facility] OR;  Service: Vascular;  Laterality: Right;  RIGHT GROIN WASHOUT   INCISION AND DRAINAGE OF WOUND Right 06/16/2023   Procedure: IRRIGATION AND DEBRIDEMENT WOUND;  Surgeon: Serene Gaile ORN, MD;  Location: [facility] OR;  Service: Vascular;  Laterality: Right;  Right Groin   INCISION AND DRAINAGE OF WOUND Right 06/21/2023   Procedure: IRRIGATION AND DEBRIDEMENT OF RIGHT GROIN AND RIGHT ABOVE KNEE AMPUTATION;  Surgeon: Serene Gaile ORN, MD;  Location: [facility] OR;   Service: Vascular;  Laterality: Right;   IR RADIOLOGIST EVAL & MGMT  04/02/2020   IR RADIOLOGIST EVAL & MGMT  03/29/2021   REMOVAL OF GRAFT Right 06/21/2023   Procedure: REMOVAL, GRAFT, RIGHT AXILLARY BIFEMORAL GRAFT;  Surgeon: Serene Gaile ORN, MD;  Location: [facility];  Service: Vascular;  Laterality: Right;   REVISION AMPUTATION, BELOW THE KNEE Right 06/16/2023   Procedure: REVISION AMPUTATION, ABOVE THE KNEE;  Surgeon: Serene Gaile ORN, MD;  Location: [facility];  Service: Vascular;  Laterality: Right;   THROMBECTOMY FEMORAL ARTERY Right 01/26/2022   Procedure: THROMBECTOMY RIGHT FEMORAL ARTERY;  Surgeon: Serene Gaile ORN, MD;  Location: [facility] OR;  Service: Vascular;  Laterality: Right;   THROMBECTOMY OF BYPASS GRAFT FEMORAL- TIBIAL ARTERY Right 05/12/2023   Procedure: THROMBECTOMY, BYPASS GRAFT, ARTERIAL;  Surgeon: Serene Gaile ORN, MD;  Location: [facility] OR;  Service: Vascular;  Laterality: Right;   TUBAL LIGATION  1990     Social History   Tobacco Use   Smoking status: Former    Current packs/day: 0.00    Types: Cigarettes    Quit date: 10/04/2019    Years since quitting: 3.7    Passive exposure: Never   Smokeless tobacco: Never  Vaping Use   Vaping status: Never Used  Substance Use Topics   Alcohol  use: No   Drug use: No    Family History  Problem Relation Age of Onset   Heart Problems Father    Heart Problems Brother    Heart Problems Brother     Allergies  Allergen Reactions   Penicillins Other (See Comments)    Shaky and Dizziness 20 years ago    OBJECTIVE: Blood pressure (!) 123/45, pulse 60, temperature 98.1 F (36.7 C), temperature source Oral, resp. rate 19, height (P) 5' 6 (1.676 m), weight (P) 60.3 kg, SpO2 100%.  Physical Exam Constitutional:      General: She is not in acute distress.    Appearance: She is well-developed.   Cardiovascular:     Rate and Rhythm: Normal rate and regular rhythm.     Heart sounds: Normal heart sounds.  Pulmonary:     Effort: Pulmonary  effort is normal.     Breath sounds: Normal breath sounds.   Skin:    General: Skin is warm and dry.   Neurological:     Mental Status: She is alert and oriented to person, place, and time.   Psychiatric:        Mood and Affect: Mood normal.     Lab Results Lab Results  Component Value Date   WBC 21.6 (H) 06/29/2023   HGB 8.3 (L) 06/29/2023   HCT 26.2 (L) 06/29/2023   MCV 93.1 06/29/2023   PLT 369 06/29/2023  Lab Results  Component Value Date   CREATININE 0.49 06/29/2023   BUN 8 06/29/2023   NA 132 (L) 06/29/2023   K 4.4 06/29/2023   CL 98 06/29/2023   CO2 25 06/29/2023    Lab Results  Component Value Date   ALT 35 06/19/2023   AST 53 (H) 06/19/2023   ALKPHOS 63 06/19/2023   BILITOT 0.6 06/19/2023     Microbiology: Recent Results (from the past 240 hours)  MRSA Next Gen by PCR, Nasal     Status: None   Collection Time: 06/27/23  4:43 PM   Specimen: Nasal Mucosa; Nasal Swab  Result Value Ref Range Status   MRSA by PCR Next Gen NOT DETECTED NOT DETECTED Final    Comment: (NOTE) The GeneXpert MRSA Assay (FDA approved for NASAL specimens only), is one component of a comprehensive MRSA colonization surveillance program. It is not intended to diagnose MRSA infection nor to guide or monitor treatment for MRSA infections. Test performance is not FDA approved in patients less than 60 years old. Performed at [facility], [address], [place], [place] [address]    Imaging DG Abd Portable 1V Result Date: 06/28/2023 CLINICAL DATA:  Abdominal pain and distention. EXAM: PORTABLE ABDOMEN - 1 VIEW COMPARISON:  Aorto bi-iliac CTA dated 05/29/2023. FINDINGS: Interval amputation of the right leg with no bone destruction or periosteal reaction involving the pelvic bones. No soft tissue gas. Right pelvic skin clips. Normal bowel-gas pattern. Right lower skin clips. Pessary. IMPRESSION: 1. Interval amputation of the right leg. 2. No acute abnormality. Electronically  Signed   By: Elspeth Bathe M.D.   On: 06/28/2023 15:28   US  EKG Site Rite Result Date: 06/14/2023 If Site Rite image not attached, placement could not be confirmed due to current cardiac rhythm.  US  EKG SITE RITE Result Date: 06/01/2023 If Site Rite image not attached, placement could not be confirmed due to current cardiac rhythm.   I have personally spent 30 minutes involved in face-to-face and non-face-to-face activities for this patient on the day of the visit. Professional time spent includes the following activities: preparing to see the patient (review of tests), obtaining and reviewing separately obtained history (admission/discharge record), performing a medically appropriate examination, ordering medications, communicating with other health care professionals, documenting clinical information in the EMR, communicating results and counseling patient regarding medication and plan of care, and care coordination.    Greg Calone, NP [facility]  06/29/2023  1:42 PM

## 2023-06-29 NOTE — Consult Note (Signed)
 Physical Medicine and Rehabilitation Consult Reason for Consult:Rehab Referring Physician: Dr. Harden   HPI: Katherine Archer is a 68 y.o. right handed female with PMH of HTN, HLD, PAD with several previous revascularization procedures.  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 Serene.  She was treated with IV antibiotics at CK elevation at that time, Unasyn was changed to vancomycin .  Patient was admitted to CIR on 06/07/2023.  Patient was noted to have increased drainage and VAC site and was taken back to the  OR on 06/16/2023 for Right groin infection and nonhealing right above-the-knee amputation and had right groin irrigation and debridement with wound VAC placement and revision of right AKA.  On 619/25 she was taken back to the OR for left axillary to left common femoral artery bypass graft, and I&D of right AKA.  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 Harden on 06/28/2023.  Patient has been seen by PT and has functional deficits.  Patient lives in a mobile home with 5 steps to enter.  Family can assist 24/7 after discharge.   Home: Home Living Family/patient expects to be discharged to:: Private residence Living Arrangements: Children, Other relatives Available Help at Discharge: Family, Available 24 hours/day Type of Home: Mobile home Home Access: Ramped entrance, Stairs to enter (plans to have ramp installed by end of July) Entrance Stairs-Number of Steps: 5 Entrance Stairs-Rails: Right, Left, Can reach both Home Layout: One level Home Equipment: Cane - single point, Agricultural consultant (2 wheels)  Lives With: Family  Functional History: Prior Function Prior Level of Function : Independent/Modified Independent Mobility Comments: Alternates between using SPC and RW. Reports no recent falls. ADLs Comments: Pt typically Independent with ADLs. Pt reports often taking a sponge bath due to the tub/shower bring small. Has family assist with  driving Functional Status:  Mobility: Bed Mobility Overal bed mobility: Needs Assistance Bed Mobility: Supine to Sit, Sit to Supine Supine to sit: Supervision, HOB elevated, Used rails Sit to supine: Min assist General bed mobility comments: semi fowler to long sitting, sitting on EOB with LLE off of bed on floor, min assist to elevate L foot from floor>bed, pt able to bridge to scoot to center of bed        ADL:    Cognition: Cognition Orientation Level: Oriented X4 Cognition Arousal: Alert Behavior During Therapy: Anxious   Review of Systems  Constitutional:  Negative for chills and fever.  HENT:  Negative for congestion.   Eyes:  Negative for blurred vision and double vision.  Respiratory:  Negative for cough.   Cardiovascular:  Negative for chest pain.  Gastrointestinal:  Negative for abdominal pain, constipation, diarrhea, nausea and vomiting.  Genitourinary: Negative.   Musculoskeletal:  Positive for joint pain.  Skin:  Negative for rash.  Neurological:  Negative for sensory change, focal weakness and headaches.   Past Medical History:  Diagnosis Date   Cervical polyp 09/18/2018   Critical lower limb ischemia (HCC)    Encounter for screening mammogram for breast cancer 05/05/2016   Fibrocystic breast changes of both breasts 04/29/2015   Hyperlipidemia    Hypertension    PAD (peripheral artery disease) (HCC) 08/23/2019   Pelvic prolapse 06/2020   Pelvic ring in place, needs to be changed every 3 months   Stroke [facility]) 08/23/2019   Past Surgical History:  Procedure Laterality Date   ABDOMINAL AORTOGRAM W/LOWER EXTREMITY Bilateral 08/23/2019   Procedure: ABDOMINAL AORTOGRAM W/ Bilateral LOWER  EXTREMITY Runoff;  Surgeon: Harvey Carlin BRAVO, MD;  Location: [facility] INVASIVE CV LAB;  Service: Cardiovascular;  Laterality: Bilateral;   AMPUTATION Right 05/31/2023   Procedure: AMPUTATION, ABOVE KNEE SARTORIOUS MUSCLE FLAP;  Surgeon: Serene Gaile ORN, MD;  Location: [facility] OR;   Service: Vascular;  Laterality: Right;   APPLICATION OF WOUND VAC Right 05/31/2023   Procedure: APPLICATION, WOUND VAC;  Surgeon: Serene Gaile ORN, MD;  Location: [facility] OR;  Service: Vascular;  Laterality: Right;   APPLICATION OF WOUND VAC Right 06/16/2023   Procedure: APPLICATION, WOUND VAC;  Surgeon: Serene Gaile ORN, MD;  Location: [facility] OR;  Service: Vascular;  Laterality: Right;   APPLICATION OF WOUND VAC Right 06/21/2023   Procedure: APPLICATION, WOUND VAC;  Surgeon: Serene Gaile ORN, MD;  Location: [facility] OR;  Service: Vascular;  Laterality: Right;   APPLICATION, SKIN SUBSTITUTE Right 05/12/2023   Procedure: APPLICATION, SKIN SUBSTITUTE KERECIS 38SQ CM;  Surgeon: Serene Gaile ORN, MD;  Location: [facility] OR;  Service: Vascular;  Laterality: Right;   APPLICATION, SKIN SUBSTITUTE Right 06/16/2023   Procedure: APPLICATION, SKIN SUBSTITUTE;  Surgeon: Serene Gaile ORN, MD;  [facility]: [facility];  Service: Vascular;  Laterality: Right;   APPLICATION, SKIN SUBSTITUTE Right 06/21/2023   Procedure: APPLICATION, SKIN SUBSTITUTE;  Surgeon: Serene Gaile ORN, MD;  Location: [facility];  Service: Vascular;  Laterality: Right;   AVGG REMOVAL Right 05/29/2023   Procedure: REMOVAL BYPASS GRAFT RIGHT LEG;  Surgeon: Lanis Fonda BRAVO, MD;  Location[facility];  Service: Vascular;  Laterality: Right;   AXILLARY-FEMORAL BYPASS GRAFT Right 11/11/2019   Procedure: BYPASS GRAFT RIGHT AXILLA-BIFEMORAL;  Surgeon: Harvey Carlin BRAVO, MD;  Location: [facility];  Service: Vascular;  Laterality: Right;   AXILLARY-FEMORAL BYPASS GRAFT Left 06/21/2023   Procedure: CREATION, BYPASS, ARTERIAL, LEFT AXILLARY TO LEFT FEMORAL, USING GRAFT;  Surgeon: Serene Gaile ORN, MD;  Location: [facility];  Service: Vascular;  Laterality: Left;   BRAIN SURGERY     BREAST SURGERY Bilateral 2014   1980 R breast surgery   CEREBRAL ANEURYSM REPAIR     ENDARTERECTOMY FEMORAL Bilateral 11/11/2019   Procedure: ENDARTERECTOMY FEMORAL BILATERALLY;  Surgeon: Harvey Carlin BRAVO, MD;  Location: [facility]  OR;  Service: Vascular;  Laterality: Bilateral;   ENDARTERECTOMY FEMORAL Right 01/26/2022   Procedure: REDO RIGHT COMMON FEMORAL ENDARTERECTOMY USING 0.8CM X 7.6CM HEMASHIELD PLATINUM FINESSE PATCH;  Surgeon: Serene Gaile ORN, MD;  Location: [facility];  Service: Vascular;  Laterality: Right;   FEMORAL-POPLITEAL BYPASS GRAFT Right 01/26/2022   Procedure: RIGHT FEMORAL-POPLITEAL BYPASS GRAFT USING GORE X 80CM PROPATEN GRAFT;  Surgeon: Serene Gaile ORN, MD;  [facility]: [facility];  Service: Vascular;  Laterality: Right;   FEMORAL-POPLITEAL BYPASS GRAFT Right 05/12/2023   Procedure: BYPASS GRAFT FEMORAL-POPLITEAL ARTERY;  Surgeon: Serene Gaile ORN, MD;  [facility];  Service: Vascular;  Laterality: Right;   INCISION AND DRAINAGE OF WOUND Right 05/31/2023   Procedure: IRRIGATION AND DEBRIDEMENT WOUND;  Surgeon: Serene Gaile ORN, MD;  [facility]: [facility];  Service: Vascular;  Laterality: Right;  RIGHT GROIN WASHOUT   INCISION AND DRAINAGE OF WOUND Right 06/16/2023   Procedure: IRRIGATION AND DEBRIDEMENT WOUND;  Surgeon: Serene Gaile ORN, MD;  [facility];  Service: Vascular;  Laterality: Right;  Right Groin   INCISION AND DRAINAGE OF WOUND Right 06/21/2023   Procedure: IRRIGATION AND DEBRIDEMENT OF RIGHT GROIN AND RIGHT ABOVE KNEE AMPUTATION;  Surgeon: Serene Gaile ORN, MD;  [facility];  Service: Vascular;  Laterality: Right;   IR RADIOLOGIST  EVAL & MGMT  04/02/2020   IR RADIOLOGIST EVAL & MGMT  03/29/2021   REMOVAL OF GRAFT Right 06/21/2023   Procedure: REMOVAL, GRAFT, RIGHT AXILLARY BIFEMORAL GRAFT;  Surgeon: Serene Gaile ORN, MD;  [facility];  Service: Vascular;  Laterality: Right;   REVISION AMPUTATION, BELOW THE KNEE Right 06/16/2023   Procedure: REVISION AMPUTATION, ABOVE THE KNEE;  Surgeon: Serene Gaile ORN, MD;  [facility]: [facility];  Service: Vascular;  Laterality: Right;   THROMBECTOMY FEMORAL ARTERY Right 01/26/2022   Procedure: THROMBECTOMY RIGHT FEMORAL ARTERY;  Surgeon: Serene Gaile ORN, MD;   [facility]: [facility];  Service: Vascular;  Laterality: Right;   THROMBECTOMY OF BYPASS GRAFT FEMORAL- TIBIAL ARTERY Right 05/12/2023   Procedure: THROMBECTOMY, BYPASS GRAFT, ARTERIAL;  Surgeon: Serene Gaile ORN, MD;  Location: [facility];  Service: Vascular;  Laterality: Right;   TUBAL LIGATION  1990   Family History  Problem Relation Age of Onset   Heart Problems Father    Heart Problems Brother    Heart Problems Brother    Social History:  reports that she quit smoking about 3 years ago. Her smoking use included cigarettes. She has never been exposed to tobacco smoke. She has never used smokeless tobacco. She reports that she does not drink alcohol  and does not use drugs. Allergies:  Allergies  Allergen Reactions   Penicillins Other (See Comments)    Shaky and Dizziness 20 years ago   Medications Prior to Admission  Medication Sig Dispense Refill   acetaminophen  (TYLENOL ) 325 MG tablet Take 2 tablets (650 mg total) by mouth every 6 (six) hours as needed for mild pain (pain score 1-3) or moderate pain (pain score 4-6).     alendronate (FOSAMAX) 70 MG tablet Take 70 mg by mouth once a week.     amLODipine  (NORVASC ) 10 MG tablet Take 1 tablet (10 mg total) by mouth daily. 30 tablet 0   ascorbic acid  (VITAMIN C ) 500 MG tablet Take 0.5 tablets (250 mg total) by mouth 2 (two) times daily. 30 tablet 0   aspirin  EC 81 MG tablet Take 1 tablet (81 mg total) by mouth daily. Swallow whole. 30 tablet 12   ezetimibe  (ZETIA ) 10 MG tablet Take 1 tablet (10 mg total) by mouth daily. 30 tablet 0   ferrous sulfate  325 (65 FE) MG tablet Take 1 tablet (325 mg total) by mouth daily with breakfast. 30 tablet 0   gabapentin  (NEURONTIN ) 300 MG capsule Take 1 capsule (300 mg total) by mouth 2 (two) times daily. 60 capsule 0   hydrALAZINE  (APRESOLINE ) 25 MG tablet Take 1 tablet (25 mg total) by mouth every 8 (eight) hours. 90 tablet 0   hydrochlorothiazide  (HYDRODIURIL ) 12.5 MG tablet Take 1 tablet (12.5 mg total) by mouth  daily. 30 tablet 0   irbesartan  (AVAPRO ) 75 MG tablet Take 1 tablet (75 mg total) by mouth daily. 30 tablet 0   oxyCODONE  (OXY IR/ROXICODONE ) 5 MG immediate release tablet Take 1-2 tablets (5-10 mg total) by mouth every 4 (four) hours as needed for severe pain (pain score 7-10). 30 tablet 0   oxyCODONE  (OXYCONTIN ) 10 mg 12 hr tablet Take 1 tablet (10 mg total) by mouth at bedtime. 14 tablet 0   polyethylene glycol powder (GLYCOLAX /MIRALAX ) 17 GM/SCOOP powder Take 1 capful (17 g) by mouth daily as needed for mild constipation. 238 g 0   vancomycin  (VANCOREADY) 1250 MG/250ML SOLN Inject 250 mLs (1,250 mg total) into the vein daily.     Zinc  Sulfate  220 (50 Zn) MG TABS Take 1 tablet (220 mg total) by mouth daily. 30 tablet 0   vancomycin  IVPB Inject 1,500 mg into the vein daily. Indication:  Bacteremia/R-groin graft infection First Dose: Yes Last Day of Therapy:  07/12/23 Labs - Sunday/Monday:  CBC/D, BMP, and vancomycin  trough. Labs - Thursday:  BMP and vancomycin  trough Labs - Once weekly: ESR and CRP Method of administration:Elastomeric Method of administration may be changed at the discretion of the patient and/or caregiver's ability to self-administer the medication ordered.     vancomycin  IVPB Inject 1,250 mg into the vein daily. Indication:  BSI First Dose: Yes Last Day of Therapy:  07/12/2023 Labs - Sunday/Monday:  CBC/D, BMP, and vancomycin  trough. Labs - Thursday:  BMP and vancomycin  trough Labs - Once weekly: ESR and CRP Method of administration:Elastomeric Method of administration may be changed at the discretion of the patient and/or caregiver's ability to self-administer the medication ordered. 30 Units 0     Blood pressure 92/64, pulse 60, temperature 98.1 F (36.7 C), temperature source Oral, resp. rate 19, height (P) 5' 6 (1.676 m), weight (P) 60.3 kg, SpO2 100%. Physical Exam  General: No apparent distress, sitting in bedside chair HEENT: Head is normocephalic,  atraumatic, sclera anicteric, oral mucosa pink and moist Neck: Supple without JVD or lymphadenopathy Heart: Reg rate and rhythm. No murmurs rubs or gallops Chest: CTA bilaterally without wheezes, rales, or rhonchi; no distress Abdomen: Soft, non-tender, non-distended, bowel sounds positive. Extremities: Right hip disarticulation site with wound VAC in place Psych: Pt's affect is appropriate. Pt is cooperative Skin: Callus on plantar L foot Neuro:  AAOx3, normal insight and awareness, memory appears to be grossly intact, no speech or language deficits noted-she is primarily Spanish-speaking but knows some [place].  Cranial nerves II through XII intact  MOTOR: RUE: 5/5 Deltoid, 5/5 Biceps, 5/5 Triceps,5/5 Grip LUE: 5/5 Deltoid, 5/5 Biceps, 5/5 Triceps, 5/5 Grip  LLE: HF 4-/5, KE 4/5, ADF 4-/5, APF 4/5   SENSORY: Intact to light touch in all 4 extremities  Coordination: No ataxia or dysmetria noted  No hypertonia noted   Results for orders placed or performed during the hospital encounter of 06/16/23 (from the past 24 hours)  Basic metabolic panel with GFR     Status: Abnormal   Collection Time: 06/29/23  5:07 AM  Result Value Ref Range   Sodium 132 (L) 135 - 145 mmol/L   Potassium 4.4 3.5 - 5.1 mmol/L   Chloride 98 98 - 111 mmol/L   CO2 25 22 - 32 mmol/L   Glucose, Bld 114 (H) 70 - 99 mg/dL   BUN 8 8 - 23 mg/dL   Creatinine, Ser 9.50 0.44 - 1.00 mg/dL   Calcium  9.1 8.9 - 10.3 mg/dL   GFR, Estimated >39 >39 mL/min   Anion gap 9 5 - 15  CBC with Differential/Platelet     Status: Abnormal   Collection Time: 06/29/23  5:07 AM  Result Value Ref Range   WBC 21.6 (H) 4.0 - 10.5 K/uL   RBC 2.46 (L) 3.87 - 5.11 MIL/uL   Hemoglobin 7.2 (L) 12.0 - 15.0 g/dL   HCT 77.0 (L) 63.9 - 53.9 %   MCV 93.1 80.0 - 100.0 fL   MCH 29.3 26.0 - 34.0 pg   MCHC 31.4 30.0 - 36.0 g/dL   RDW 84.6 88.4 - 84.4 %   Platelets 369 150 - 400 K/uL   nRBC 0.0 0.0 - 0.2 %   Neutrophils Relative %  80 %    Neutro Abs 17.4 (H) 1.7 - 7.7 K/uL   Lymphocytes Relative 11 %   Lymphs Abs 2.3 0.7 - 4.0 K/uL   Monocytes Relative 7 %   Monocytes Absolute 1.5 (H) 0.1 - 1.0 K/uL   Eosinophils Relative 0 %   Eosinophils Absolute 0.0 0.0 - 0.5 K/uL   Basophils Relative 0 %   Basophils Absolute 0.0 0.0 - 0.1 K/uL   Immature Granulocytes 2 %   Abs Immature Granulocytes 0.44 (H) 0.00 - 0.07 K/uL  Prepare RBC (crossmatch)     Status: None   Collection Time: 06/29/23  8:30 AM  Result Value Ref Range   Order Confirmation      ORDER PROCESSED BY BLOOD BANK Performed at [facility], [address] [place], [place] [address]   Hemoglobin and hematocrit, blood     Status: Abnormal   Collection Time: 06/29/23  9:47 AM  Result Value Ref Range   Hemoglobin 8.3 (L) 12.0 - 15.0 g/dL   HCT 73.7 (L) 63.9 - 53.9 %  Protime-INR     Status: None   Collection Time: 06/29/23  9:47 AM  Result Value Ref Range   Prothrombin Time 15.1 11.4 - 15.2 seconds   INR 1.1 0.8 - 1.2  Fibrinogen      Status: Abnormal   Collection Time: 06/29/23  9:47 AM  Result Value Ref Range   Fibrinogen  646 (H) 210 - 475 mg/dL   DG Abd Portable 1V Result Date: 06/28/2023 CLINICAL DATA:  Abdominal pain and distention. EXAM: PORTABLE ABDOMEN - 1 VIEW COMPARISON:  Aorto bi-iliac CTA dated 05/29/2023. FINDINGS: Interval amputation of the right leg with no bone destruction or periosteal reaction involving the pelvic bones. No soft tissue gas. Right pelvic skin clips. Normal bowel-gas pattern. Right lower skin clips. Pessary. IMPRESSION: 1. Interval amputation of the right leg. 2. No acute abnormality. Electronically Signed   By: Elspeth Bathe M.D.   On: 06/28/2023 15:28    Assessment/Plan: Diagnosis: Right hip disarticulation Does the need for close, 24 hr/day medical supervision in concert with the patient's rehab needs make it unreasonable for this patient to be served in a less intensive setting? Yes Co-Morbidities requiring  supervision/potential complications:  -HTN, HLD, ABLA, constipation, Hyponatremia, Leukocytosis Due to bladder management, bowel management, safety, skin/wound care, disease management, medication administration, pain management, and patient education, does the patient require 24 hr/day rehab nursing? Yes Does the patient require coordinated care of a physician, rehab nurse, therapy disciplines of PT/OT to address physical and functional deficits in the context of the above medical diagnosis(es)? Yes Addressing deficits in the following areas: balance, endurance, locomotion, strength, transferring, bowel/bladder control, bathing, dressing, feeding, grooming, toileting, and psychosocial support Can the patient actively participate in an intensive therapy program of at least 3 hrs of therapy per day at least 5 days per week? Yes The potential for patient to make measurable gains while on inpatient rehab is excellent Anticipated functional outcomes upon discharge from inpatient rehab are supervision  with PT, supervision with OT, n/a with SLP. WC level Estimated rehab length of stay to reach the above functional goals is: 7-10 Anticipated discharge destination: Home Overall Rehab/Functional Prognosis: excellent  POST ACUTE RECOMMENDATIONS: This patient's condition is appropriate for continued rehabilitative care in the following setting: CIR Patient has agreed to participate in recommended program. Yes Note that insurance prior authorization may be required for reimbursement for recommended care.  Comment: I think she needed candidate for CIR,  rehab coordinator to follow-up   I have personally performed a face to face diagnostic evaluation of this patient. Additionally, I have examined the patient's medical record including any pertinent labs and radiographic images.    Thanks,  Murray Collier, MD 06/29/2023

## 2023-06-29 NOTE — Progress Notes (Signed)
  Progress Note    06/29/2023 7:39 AM 1 Day Post-Op  Subjective:  no complaints this morning, says pain overall well managed   Vitals:   06/28/23 2302 06/29/23 0311  BP: (!) 130/46 (!) 142/49  Pulse: 70 74  Resp: 19 16  Temp: 98.5 F (36.9 C) 98.2 F (36.8 C)  SpO2: 92%    Physical Exam: Cardiac:  regular Lungs:  non labored Incisions:  Right hip disarticulation with VAC to suction. Left groin and left infraclavicular incisions are intact and well appearing Extremities: Left lower extremity remains well perfused and warm Abdomen:  soft Neurologic: alert and oriented  CBC    Component Value Date/Time   WBC 10.3 06/28/2023 0640   RBC 3.43 (L) 06/28/2023 0640   HGB 10.1 (L) 06/28/2023 0640   HGB 14.2 03/23/2021 0928   HCT 31.1 (L) 06/28/2023 0640   HCT 42.1 03/23/2021 0928   PLT 309 06/28/2023 0640   PLT 135 (L) 03/23/2021 0928   MCV 90.7 06/28/2023 0640   MCV 93 03/23/2021 0928   MCH 29.4 06/28/2023 0640   MCHC 32.5 06/28/2023 0640   RDW 15.3 06/28/2023 0640   RDW 12.0 03/23/2021 0928   LYMPHSABS 1.5 06/28/2023 0640   LYMPHSABS 1.9 03/23/2021 0928   MONOABS 0.7 06/28/2023 0640   EOSABS 0.2 06/28/2023 0640   EOSABS 0.3 03/23/2021 0928   BASOSABS 0.1 06/28/2023 0640   BASOSABS 0.0 03/23/2021 0928    BMET    Component Value Date/Time   NA 132 (L) 06/29/2023 0507   NA 138 01/15/2020 1128   K 4.4 06/29/2023 0507   CL 98 06/29/2023 0507   CO2 25 06/29/2023 0507   GLUCOSE 114 (H) 06/29/2023 0507   BUN 8 06/29/2023 0507   BUN 8 01/15/2020 1128   CREATININE 0.49 06/29/2023 0507   CALCIUM  9.1 06/29/2023 0507   GFRNONAA >60 06/29/2023 0507   GFRAA 80 01/15/2020 1128    INR    Component Value Date/Time   INR 1.5 (H) 05/29/2023 0030     Intake/Output Summary (Last 24 hours) at 06/29/2023 0739 Last data filed at 06/28/2023 1945 Gross per 24 hour  Intake 600 ml  Output 2250 ml  Net -1650 ml     Assessment/Plan:  68 y.o. female is s/p excision of  infected R ax-bifemoral bypass with left axillary to CF bypass graft 6/18. POD#1 from hip Hip disarticulation by Dr. Harden  Pain well managed Right hip disarticulation with vac to suction. Wound management per Dr. Harden LLE remains well perfused and warm Left groin, left infraclavicular incisions intact and well appearing PT Recommending CIR Continue therapy Will continue to follow    Teretha Damme, PA-C [facility] [phone] 06/29/2023 7:39 AM

## 2023-06-29 NOTE — Progress Notes (Signed)
 Palliative:  HPI: 68 y.o. female with past medical history of PAD, critical limb ischemia s/p R femoral-popliteal artery bypass s/p R AKA, HTN, HLD admitted on 06/16/2023 with purulent drainage at right groin and poor healing or R AKA.    I met today with Katherine Archer along with daughter, [name], at bedside as well as Annie's 2 young grandchildren. [name] is in overall good spirits. She sat up in recliner today. She has escalating pain during my visit ultimately with some relief from OxyIR. She is trying to avoid pain medication if possible - we discussed also not waiting too late for medication or it will not work as well. They are hopeful to move back into CIR. ID is following for antibiotic recommendations. Ultimate plan is to return home - [name] is asking about hospital bed for in the home. I reassured her that CIR will assist with obtaining equipment for the home. [name] also voices concerns for poor intake - food as well as fluids. We discussed together and tips provided to assist with having increased intake and optimizing caloric intake.   All questions/concerns addressed. Emotional support provided.   Exam: Awake, alert. Discomfort after return to bed from chair but relief with medication. Breathing regular, unlabored. Abd soft. Wound vac to R hip area.   Plan: - DNR/DNI in place - Hopeful for CIR with goal to return home - Requesting hospital bed for the home  35 min  Bernarda Kitty, NP Palliative Medicine Team Pager [phone] (Please see [email or web address] for schedule) Team Phone [phone]

## 2023-06-29 NOTE — Progress Notes (Signed)
  Inpatient Rehabilitation Admissions Coordinator   I spoke with patient's granddaughter, Melody, by phone for rehab assessment. Patient previously at [facility] 6/4 until 06/16/23 and discharged to acute for further surgical care.We discussed goals and expectations of a possible CIR admit. Melody will check with her Mom, [name], to verify their preference for rehab venue. Family can provide expected caregiver support that is recommended . Dr Urbano will consult today. Please call me with any questions.   Heron Leavell, RN, MSN Rehab Admissions Coordinator [phone]

## 2023-06-30 ENCOUNTER — Encounter (HOSPITAL_COMMUNITY): Payer: Self-pay | Admitting: Interventional Radiology

## 2023-06-30 ENCOUNTER — Other Ambulatory Visit (HOSPITAL_COMMUNITY): Payer: Self-pay

## 2023-06-30 LAB — MAGNESIUM: Magnesium: 1.9 mg/dL (ref 1.7–2.4)

## 2023-06-30 LAB — VANCOMYCIN, TROUGH: Vancomycin Tr: 13 ug/mL — ABNORMAL LOW (ref 15–20)

## 2023-06-30 LAB — CBC WITH DIFFERENTIAL/PLATELET
Abs Immature Granulocytes: 0.47 10*3/uL — ABNORMAL HIGH (ref 0.00–0.07)
Basophils Absolute: 0 10*3/uL (ref 0.0–0.1)
Basophils Relative: 0 %
Eosinophils Absolute: 0.1 10*3/uL (ref 0.0–0.5)
Eosinophils Relative: 0 %
HCT: 23.8 % — ABNORMAL LOW (ref 36.0–46.0)
Hemoglobin: 7.4 g/dL — ABNORMAL LOW (ref 12.0–15.0)
Immature Granulocytes: 3 %
Lymphocytes Relative: 14 %
Lymphs Abs: 2.7 10*3/uL (ref 0.7–4.0)
MCH: 28.9 pg (ref 26.0–34.0)
MCHC: 31.1 g/dL (ref 30.0–36.0)
MCV: 93 fL (ref 80.0–100.0)
Monocytes Absolute: 1.5 10*3/uL — ABNORMAL HIGH (ref 0.1–1.0)
Monocytes Relative: 8 %
Neutro Abs: 13.9 10*3/uL — ABNORMAL HIGH (ref 1.7–7.7)
Neutrophils Relative %: 75 %
Platelets: 356 10*3/uL (ref 150–400)
RBC: 2.56 MIL/uL — ABNORMAL LOW (ref 3.87–5.11)
RDW: 15.3 % (ref 11.5–15.5)
WBC: 18.7 10*3/uL — ABNORMAL HIGH (ref 4.0–10.5)
nRBC: 0 % (ref 0.0–0.2)

## 2023-06-30 LAB — BASIC METABOLIC PANEL WITH GFR
Anion gap: 10 (ref 5–15)
BUN: 11 mg/dL (ref 8–23)
CO2: 27 mmol/L (ref 22–32)
Calcium: 8.6 mg/dL — ABNORMAL LOW (ref 8.9–10.3)
Chloride: 94 mmol/L — ABNORMAL LOW (ref 98–111)
Creatinine, Ser: 0.51 mg/dL (ref 0.44–1.00)
GFR, Estimated: >60 mL/min (ref >60)
Glucose, Bld: 97 mg/dL (ref 70–99)
Potassium: 4.3 mmol/L (ref 3.5–5.1)
Sodium: 131 mmol/L — ABNORMAL LOW (ref 135–145)

## 2023-06-30 LAB — CBC
HCT: 24.3 % — ABNORMAL LOW (ref 36.0–46.0)
Hemoglobin: 7.8 g/dL — ABNORMAL LOW (ref 12.0–15.0)
MCH: 29 pg (ref 26.0–34.0)
MCHC: 32.1 g/dL (ref 30.0–36.0)
MCV: 90.3 fL (ref 80.0–100.0)
Platelets: 319 10*3/uL (ref 150–400)
RBC: 2.69 MIL/uL — ABNORMAL LOW (ref 3.87–5.11)
RDW: 15.2 % (ref 11.5–15.5)
WBC: 17.4 10*3/uL — ABNORMAL HIGH (ref 4.0–10.5)
nRBC: 0 % (ref 0.0–0.2)

## 2023-06-30 MED ORDER — LINEZOLID 600 MG PO TABS
600.0000 mg | ORAL_TABLET | Freq: Two times a day (BID) | ORAL | Status: DC
  Administered 2023-06-30 – 2023-07-04 (×8): 600 mg via ORAL
  Filled 2023-06-30 (×9): qty 1

## 2023-06-30 NOTE — Progress Notes (Signed)
 Patient ID: Katherine Archer, female   DOB: 1955-07-15, 68 y.o.   MRN: [id number]   Impression: Pelvic organ prolapse with pessary in place No evidence of vaginal infection  Recommendations: Patient is status post pessary changed today she would not need another 1 for 3 months. 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  Patient has a pessary in place has been unable to have this change since February of this year.  Patient was seen in consultation yesterday and her daughter asked that we change the pessary so that we returned today to do this.  Past Medical History:  Diagnosis Date   Cervical polyp 09/18/2018   Critical lower limb ischemia (HCC)    Encounter for screening mammogram for breast cancer 05/05/2016   Fibrocystic breast changes of both breasts 04/29/2015   Hyperlipidemia    Hypertension    PAD (peripheral artery disease) (HCC) 08/23/2019   Pelvic prolapse 06/2020   Pelvic ring in place, needs to be changed every 3 months   Stroke (HCC) 08/23/2019    Past Surgical History:  Procedure Laterality Date   ABDOMINAL AORTOGRAM W/LOWER EXTREMITY Bilateral 08/23/2019   Procedure: ABDOMINAL AORTOGRAM W/ Bilateral LOWER EXTREMITY [place];  Surgeon: Harvey Carlin BRAVO, MD;  Location: [facility] INVASIVE CV LAB;  Service: Cardiovascular;  Laterality: Bilateral;   AMPUTATION Right 05/31/2023   Procedure: AMPUTATION, ABOVE KNEE SARTORIOUS MUSCLE FLAP;  Surgeon: Serene Gaile ORN, MD;  Location: [facility] OR;  Service: Vascular;  Laterality: Right;   APPLICATION OF WOUND VAC Right 05/31/2023   Procedure: APPLICATION, WOUND VAC;  Surgeon: Serene Gaile ORN, MD;  Location: [facility] OR;  Service: Vascular;  Laterality: Right;   APPLICATION OF WOUND VAC Right 06/16/2023   Procedure: APPLICATION, WOUND VAC;  Surgeon: Serene Gaile ORN, MD;   Location: [facility] OR;  Service: Vascular;  Laterality: Right;   APPLICATION OF WOUND VAC Right 06/21/2023   Procedure: APPLICATION, WOUND VAC;  Surgeon: Serene Gaile ORN, MD;  Location: [facility] OR;  Service: Vascular;  Laterality: Right;   APPLICATION, SKIN SUBSTITUTE Right 05/12/2023   Procedure: APPLICATION, SKIN SUBSTITUTE KERECIS 38SQ CM;  Surgeon: Serene Gaile ORN, MD;  Location: [facility] OR;  Service: Vascular;  Laterality: Right;   APPLICATION, SKIN SUBSTITUTE Right 06/16/2023   Procedure: APPLICATION, SKIN SUBSTITUTE;  Surgeon: Serene Gaile ORN, MD;  Location: [facility];  Service: Vascular;  Laterality: Right;   APPLICATION, SKIN SUBSTITUTE Right 06/21/2023   Procedure: APPLICATION, SKIN SUBSTITUTE;  Surgeon: Serene Gaile ORN, MD;  Location: [facility];  Service: Vascular;  Laterality: Right;   AVGG REMOVAL Right 05/29/2023   Procedure: REMOVAL BYPASS GRAFT RIGHT LEG;  Surgeon: Lanis Fonda BRAVO, MD;  Location: [facility] OR;  Service: Vascular;  Laterality: Right;   AXILLARY-FEMORAL BYPASS GRAFT Right 11/11/2019   Procedure: BYPASS GRAFT RIGHT AXILLA-BIFEMORAL;  Surgeon: Harvey Carlin BRAVO, MD;  Location: [facility];  Service: Vascular;  Laterality: Right;   AXILLARY-FEMORAL BYPASS GRAFT Left 06/21/2023   Procedure: CREATION, BYPASS, ARTERIAL, LEFT AXILLARY TO LEFT FEMORAL, USING GRAFT;  Surgeon: Serene Gaile ORN, MD;  Location: [facility];  Service: Vascular;  Laterality: Left;   BRAIN SURGERY     BREAST SURGERY Bilateral 2014   1980 R breast surgery   CEREBRAL ANEURYSM REPAIR     ENDARTERECTOMY FEMORAL Bilateral 11/11/2019   Procedure: ENDARTERECTOMY FEMORAL BILATERALLY;  Surgeon: Harvey Carlin BRAVO, MD;  Location: [facility];  Service: Vascular;  Laterality: Bilateral;   ENDARTERECTOMY FEMORAL Right 01/26/2022   Procedure: REDO RIGHT COMMON FEMORAL ENDARTERECTOMY USING 0.8CM X 7.6CM HEMASHIELD PLATINUM FINESSE PATCH;  Surgeon: Serene Gaile ORN, MD;  Location: [facility] OR;  Service: Vascular;  Laterality: Right;   FEMORAL-POPLITEAL BYPASS GRAFT  Right 01/26/2022   Procedure: RIGHT FEMORAL-POPLITEAL BYPASS GRAFT USING GORE X 80CM PROPATEN GRAFT;  Surgeon: Serene Gaile ORN, MD;  Location: [facility];  Service: Vascular;  Laterality: Right;   FEMORAL-POPLITEAL BYPASS GRAFT Right 05/12/2023   Procedure: BYPASS GRAFT FEMORAL-POPLITEAL ARTERY;  Surgeon: Serene Gaile ORN, MD;  Location: [facility] OR;  Service: Vascular;  Laterality: Right;   HIP DISARTICULATION Right 06/28/2023   Procedure: MARGA, HIP;  Surgeon: Harden Jerona GAILS, MD;  Location: [facility] OR;  Service: Orthopedics;  Laterality: Right;   INCISION AND DRAINAGE OF WOUND Right 05/31/2023   Procedure: IRRIGATION AND DEBRIDEMENT WOUND;  Surgeon: Serene Gaile ORN, MD;  Location: [facility];  Service: Vascular;  Laterality: Right;  RIGHT GROIN WASHOUT   INCISION AND DRAINAGE OF WOUND Right 06/16/2023   Procedure: IRRIGATION AND DEBRIDEMENT WOUND;  Surgeon: Serene Gaile ORN, MD;  Location: [facility];  Service: Vascular;  Laterality: Right;  Right Groin   INCISION AND DRAINAGE OF WOUND Right 06/21/2023   Procedure: IRRIGATION AND DEBRIDEMENT OF RIGHT GROIN AND RIGHT ABOVE KNEE AMPUTATION;  Surgeon: Serene Gaile ORN, MD;  Location: [facility];  Service: Vascular;  Laterality: Right;   IR RADIOLOGIST EVAL & MGMT  04/02/2020   IR RADIOLOGIST EVAL & MGMT  03/29/2021   REMOVAL OF GRAFT Right 06/21/2023   Procedure: REMOVAL, GRAFT, RIGHT AXILLARY BIFEMORAL GRAFT;  Surgeon: Serene Gaile ORN, MD;  Location: [facility];  Service: Vascular;  Laterality: Right;   REVISION AMPUTATION, BELOW THE KNEE Right 06/16/2023   Procedure: REVISION AMPUTATION, ABOVE THE KNEE;  Surgeon: Serene Gaile ORN, MD;  Location[facility];  Service: Vascular;  Laterality: Right;   THROMBECTOMY FEMORAL ARTERY Right 01/26/2022   Procedure: THROMBECTOMY RIGHT FEMORAL ARTERY;  Surgeon: Serene Gaile ORN, MD;  Location: [facility];  Service: Vascular;  Laterality: Right;   THROMBECTOMY OF BYPASS GRAFT FEMORAL- TIBIAL ARTERY Right 05/12/2023   Procedure: THROMBECTOMY, BYPASS  GRAFT, ARTERIAL;  Surgeon: Serene Gaile ORN, MD;  Location: [facility] OR;  Service: Vascular;  Laterality: Right;   TUBAL LIGATION  1990    Family History  Problem Relation Age of Onset   Heart Problems Father    Heart Problems Brother    Heart Problems Brother     Social History   Socioeconomic History   Marital status: Divorced    Spouse name: Not on file   Number of children: Not on file   Years of education: Not on file   Highest education level: Not on file  Occupational History   Not on file  Tobacco Use   Smoking status: Former    Current packs/day: 0.00    Types: Cigarettes    Quit date: 10/04/2019    Years since quitting: 3.7    Passive exposure: Never   Smokeless tobacco: Never  Vaping Use   Vaping status: Never Used  Substance and Sexual Activity   Alcohol  use: No   Drug use: No   Sexual activity: Not Currently  Other Topics Concern   Not on file  Social History Narrative   Not on file   Social Drivers of Health   Financial Resource Strain: Not on file  Food Insecurity: No Food Insecurity (  06/17/2023)   Hunger Vital Sign    Worried About Running Out of Food in the Last Year: Never true    Ran Out of Food in the Last Year: Never true  Transportation Needs: No Transportation Needs (06/17/2023)   PRAPARE - Administrator, Civil Service (Medical): No    Lack of Transportation (Non-Medical): No  Physical Activity: Not on file  Stress: Not on file  Social Connections: Socially Isolated (06/17/2023)   Social Connection and Isolation Panel    Frequency of Communication with Friends and Family: More than three times a week    Frequency of Social Gatherings with Friends and Family: More than three times a week    Attends Religious Services: Never    Database administrator or Organizations: No    Attends Banker Meetings: Never    Marital Status: Widowed  Intimate Partner Violence: Unknown (06/17/2023)   Humiliation, Afraid, Rape, and Kick  questionnaire    Fear of Current or Ex-Partner: No    Emotionally Abused: No    Physically Abused: Not on file    Sexually Abused: No     sodium chloride    Intravenous Once   amLODipine   10 mg Oral Daily   ascorbic acid   250 mg Oral BID   Chlorhexidine  Gluconate Cloth  6 each Topical Daily   feeding supplement  237 mL Oral BID BM   fluconazole   100 mg Oral Daily   gabapentin   300 mg Oral BID   hydrALAZINE   25 mg Oral Q8H   linezolid   600 mg Oral Q12H   metoprolol  succinate  12.5 mg Oral Daily   multivitamin with minerals  1 tablet Oral Daily   oxyCODONE   10 mg Oral QHS   pantoprazole   40 mg Oral Daily   polyethylene glycol  17 g Oral Daily   senna-docusate  1 tablet Oral BID   sodium chloride  flush  3 mL Intravenous Q12H    Allergies  Allergen Reactions   Penicillins Other (See Comments)    Shaky and Dizziness 20 years ago    Review of Systems - Negative except occasional vaginal itching  Exam Vitals:   06/30/23 0417 06/30/23 0811  BP: (!) 122/57 (!) 137/45  Pulse: 79 73  Resp: 18   Temp: 100.1 F (37.8 C) 98.2 F (36.8 C)  SpO2: 93% 100%   @SPO2 @  Physical Examination: General appearance - chronically ill appearing Chest -normal effort Heart - normal rate and regular rhythm Abdomen - soft, nontender, nondistended, no masses or organomegaly Pelvic -there is marked edema and swelling on the patient's right labia majora and vaginal canal related to prior surgery.  Pessary was removed and cleaned and repositioned easily. Patient tolerated the procedure well  Labs: Results for orders placed or performed during the hospital encounter of 06/16/23 (from the past 24 hours)  CBC     Status: Abnormal   Collection Time: 06/29/23  8:00 PM  Result Value Ref Range   WBC 18.6 (H) 4.0 - 10.5 K/uL   RBC 2.65 (L) 3.87 - 5.11 MIL/uL   Hemoglobin 7.9 (L) 12.0 - 15.0 g/dL   HCT 75.2 (L) 63.9 - 53.9 %   MCV 93.2 80.0 - 100.0 fL   MCH 29.8 26.0 - 34.0 pg   MCHC 32.0 30.0 -  36.0 g/dL   RDW 84.5 88.4 - 84.4 %   Platelets 362 150 - 400 K/uL   nRBC 0.0 0.0 - 0.2 %  Vancomycin , trough  Status: Abnormal   Collection Time: 06/30/23  4:19 AM  Result Value Ref Range   Vancomycin  Tr 13 (L) 15 - 20 ug/mL  Basic metabolic panel with GFR     Status: Abnormal   Collection Time: 06/30/23  4:19 AM  Result Value Ref Range   Sodium 131 (L) 135 - 145 mmol/L   Potassium 4.3 3.5 - 5.1 mmol/L   Chloride 94 (L) 98 - 111 mmol/L   CO2 27 22 - 32 mmol/L   Glucose, Bld 97 70 - 99 mg/dL   BUN 11 8 - 23 mg/dL   Creatinine, Ser 9.48 0.44 - 1.00 mg/dL   Calcium  8.6 (L) 8.9 - 10.3 mg/dL   GFR, Estimated >39 >39 mL/min   Anion gap 10 5 - 15  CBC with Differential/Platelet     Status: Abnormal   Collection Time: 06/30/23  4:19 AM  Result Value Ref Range   WBC 18.7 (H) 4.0 - 10.5 K/uL   RBC 2.56 (L) 3.87 - 5.11 MIL/uL   Hemoglobin 7.4 (L) 12.0 - 15.0 g/dL   HCT 76.1 (L) 63.9 - 53.9 %   MCV 93.0 80.0 - 100.0 fL   MCH 28.9 26.0 - 34.0 pg   MCHC 31.1 30.0 - 36.0 g/dL   RDW 84.6 88.4 - 84.4 %   Platelets 356 150 - 400 K/uL   nRBC 0.0 0.0 - 0.2 %   Neutrophils Relative % 75 %   Neutro Abs 13.9 (H) 1.7 - 7.7 K/uL   Lymphocytes Relative 14 %   Lymphs Abs 2.7 0.7 - 4.0 K/uL   Monocytes Relative 8 %   Monocytes Absolute 1.5 (H) 0.1 - 1.0 K/uL   Eosinophils Relative 0 %   Eosinophils Absolute 0.1 0.0 - 0.5 K/uL   Basophils Relative 0 %   Basophils Absolute 0.0 0.0 - 0.1 K/uL   Immature Granulocytes 3 %   Abs Immature Granulocytes 0.47 (H) 0.00 - 0.07 K/uL  Magnesium      Status: None   Collection Time: 06/30/23  4:19 AM  Result Value Ref Range   Magnesium  1.9 1.7 - 2.4 mg/dL    Radiological Studies DG Abd Portable 1V Result Date: 06/28/2023 CLINICAL DATA:  Abdominal pain and distention. EXAM: PORTABLE ABDOMEN - 1 VIEW COMPARISON:  Aorto bi-iliac CTA dated 05/29/2023. FINDINGS: Interval amputation of the right leg with no bone destruction or periosteal reaction involving the  pelvic bones. No soft tissue gas. Right pelvic skin clips. Normal bowel-gas pattern. Right lower skin clips. Pessary. IMPRESSION: 1. Interval amputation of the right leg. 2. No acute abnormality. Electronically Signed   By: Elspeth Bathe M.D.   On: 06/28/2023 15:28   US  EKG Site Rite Result Date: 06/14/2023 If Site Rite image not attached, placement could not be confirmed due to current cardiac rhythm.  US  EKG SITE RITE Result Date: 06/01/2023 If Site Rite image not attached, placement could not be confirmed due to current cardiac rhythm.   Thank you so much for allowing us  to participate in the care of this patient.  We will SIGN OFF for now. Please call the attending OB/GYN physician with questions or concerns at [phone] M-F 8a-5p, after hours and on weekends, we can be reached at [phone].

## 2023-06-30 NOTE — Progress Notes (Signed)
 OT Cancellation Note  Patient Details Name: Katherine Archer MRN: [id number] DOB: Jul 19, 1955   Cancelled Treatment:    Reason Eval/Treat Not Completed: Patient declined, no reason specified (reports tomorrow is better) Daughter reports the patient does better with in person translation and the machine she does not respond as well to the screen. Daughter will be present prior to 2pm 6/28 (she will leave for work around 2:30pm)  Ely Molt 06/30/2023, 2:29 PM

## 2023-06-30 NOTE — Progress Notes (Signed)
    [facility]  Date of Admission:  06/16/2023     Reason for Follow Up: Vascular graft infection           BRIEF PROGRESS NOTE:  Katherine Archer is POD #2 from right hip disarticulation in the setting of vascular graft infection complicated by ischemic changes and non-healing right above knee amputation. Left side unaffected. Source control appears to have been achieved. Will change antibiotics to Linezolid  600 mg PO bid for until previous end date of 07/12/23. Ok to remove PICC line if not needed for any other treatments.   Follow up with Dr. Dea on 07/20/23 at 1:30 pm.   Greg Launa Goedken, NP [facility]  06/30/2023  11:34 AM

## 2023-06-30 NOTE — Progress Notes (Signed)
 [facility] Progress Note Patient: Katherine Archer FMW:[id number] DOB: Oct 14, 1955 DOA: 06/16/2023  DOS: the patient was seen and examined on 06/30/2023  Brief Hospital Course: PMH of HTN, PAD, HLD, CVA, presented with wound dehiscence. Vascular surgery as well as orthopedic surgery were consulted.  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 Underwent AKA and hip disarticulation. Currently on IV vancomycin .  ID consulted.  Appreciate assistance. Monitor.  On Zyvox .  PAD. Vascular surgery following. Underwent left common femoral artery bypass graft and I&D of the right AKA. Currently has a wound VAC.  Postop nausea. X-ray abdomen unremarkable.  Vaginal candidiasis. Initiating Diflucan .  Vaginal pessary. Falling  behind on exchange. Appreciate GYN consultation. Outpatient follow-up recommended.  HTN. Blood pressure stable. Continue home regimen.  Anemia secondary to acute blood loss. Monitor H&H. Transfuse for hemoglobin less than 7.  1 PRBC given on 6/26.  Severe protein calorie malnutrition. Dietitian following. Placing the patient at a high risk for poor outcome.  Hyponatremia. Clinically nonsignificant.  Leukocytosis. Likely stress reaction. Monitor.   Subjective: Pain well-controlled.  No nausea no vomiting.  Wound VAC output bloody.  Physical Exam: General: in Mild distress, No Rash Cardiovascular: S1 and S2 Present, No Murmur Respiratory: Good respiratory effort, Bilateral Air entry present. No Crackles, No wheezes Abdomen: Bowel Sound present, abdominal tenderness at the incision site Extremities: No edema Neuro: Alert and oriented x3, no new focal deficit  Data Reviewed: I have Reviewed nursing notes, Vitals, and Lab results. Since last encounter, pertinent lab results CBC and BMP   . I have ordered test including CBC and BMP  .   Disposition: Status is: Inpatient Remains inpatient appropriate because: Needing  stability of H&H  SCD's Start: 06/28/23 1155   Family Communication: No one at bedside Level of care: Progressive   Vitals:   06/30/23 0546 06/30/23 0811 06/30/23 1218 06/30/23 1653  BP:  (!) 137/45 (!) 126/51 (!) 121/44  Pulse:  73 84 79  Resp:   (!) 22 (!) 22  Temp:  98.2 F (36.8 C) 98.1 F (36.7 C) 100.3 F (37.9 C)  TempSrc:  Oral Oral Oral  SpO2:  100% 99% 95%  Weight: 59.1 kg     Height:         Author: Yetta Blanch, MD 06/30/2023 6:16 PM  Please look on [email or web address] to find out who is on call.

## 2023-06-30 NOTE — Progress Notes (Signed)
 Physical Therapy Treatment Patient Details Name: Katherine Archer MRN: [id number] DOB: 11-23-1955 Today's Date: 06/30/2023   History of Present Illness Pt is a 68 y/o F admitted from CIR to acute services on 06/16/23. 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 Pt underwent R groin I&D & R AKA revision on 06/16/23, removal of R axillofemoral & femorofemoral bypass on 06/21/23. Pt underwent R hip disarticulation on 06/28/23. PMH: HLD, HTN, PAD, stroke    PT Comments  Pt is progressing towards goals. CGA to Min a for sit to stand and Min A for very short non-functional distance gait and transfers. Pt has supportive family at home. Due to pt current functional status, home set up and available assistance at home recommending skilled physical therapy services > 3 hours/day in order to address strength, balance and functional mobility to decrease risk for falls, injury, immobility, skin break down and re-hospitalization.     If plan is discharge home, recommend the following: A lot of help with walking and/or transfers;Assist for transportation;Help with stairs or ramp for entrance;Assistance with Systems developer cushion (measurements PT);Wheelchair (measurements PT);BSC/3in1       Precautions / Restrictions Precautions Precautions: Fall Recall of Precautions/Restrictions: Intact Precaution/Restrictions Comments: wound vac Restrictions Weight Bearing Restrictions Per Provider Order: Yes RLE Weight Bearing Per Provider Order: Non weight bearing     Mobility  Bed Mobility Overal bed mobility: Needs Assistance Bed Mobility: Supine to Sit     Supine to sit: Supervision, HOB elevated, Used rails     General bed mobility comments: increased time.    Transfers Overall transfer level: Needs assistance Equipment used: Rolling walker (2  wheels) Transfers: Sit to/from Stand, Bed to chair/wheelchair/BSC Sit to Stand: Contact guard assist   Step pivot transfers: Min assist       General transfer comment: CGA for sit to stand and Min A for step pivot transfer from EOB to recliner with low foot clearance Min A for balance.    Ambulation/Gait Ambulation/Gait assistance: Min assist Gait Distance (Feet): 4 Feet Assistive device: Rolling walker (2 wheels) Gait Pattern/deviations: Step-to pattern Gait velocity: decreased Gait velocity interpretation: <1.31 ft/sec, indicative of household ambulator   General Gait Details: hop to gait pattern with RW and low foot clearance with Min A for balance.      Balance Overall balance assessment: Needs assistance Sitting-balance support: Feet supported, Bilateral upper extremity supported Sitting balance-Leahy Scale: Fair Sitting balance - Comments: supervision static sitting   Standing balance support: Bilateral upper extremity supported, During functional activity, Reliant on assistive device for balance Standing balance-Leahy Scale: Poor Standing balance comment: requires BUE support for balance and Min A        Communication Communication Communication: No apparent difficulties;Other (comment) (video intereter utilized)  Cognition Arousal: Alert Behavior During Therapy: WFL for tasks assessed/performed   PT - Cognitive impairments: No apparent impairments     PT - Cognition Comments: Pt transferring to standing without PT beside her Following commands: Intact      Cueing Cueing Techniques: Verbal cues     General Comments General comments (skin integrity, edema, etc.): Pt has wound vac on R surgical area      Pertinent Vitals/Pain Pain Assessment Pain Assessment: Faces Faces Pain Scale: Hurts little more Pain Location: R hip/surgical site Pain Descriptors / Indicators: Grimacing, Guarding, Discomfort Pain Intervention(s): Monitored during session,  Limited  activity within patient's tolerance     PT Goals (current goals can now be found in the care plan section) Acute Rehab PT Goals Patient Stated Goal: get better, eventually go home PT Goal Formulation: With patient/family Time For Goal Achievement: 07/12/23 Potential to Achieve Goals: Fair Progress towards PT goals: Progressing toward goals    Frequency    Min 4X/week      PT Plan  Continue with current POC        AM-PAC PT 6 Clicks Mobility   Outcome Measure  Help needed turning from your back to your side while in a flat bed without using bedrails?: A Little Help needed moving from lying on your back to sitting on the side of a flat bed without using bedrails?: A Little Help needed moving to and from a bed to a chair (including a wheelchair)?: A Little Help needed standing up from a chair using your arms (e.g., wheelchair or bedside chair)?: A Little Help needed to walk in hospital room?: A Lot Help needed climbing 3-5 steps with a railing? : Total 6 Click Score: 15    End of Session Equipment Utilized During Treatment: Gait belt Activity Tolerance: Patient tolerated treatment well;Patient limited by pain Patient left: in chair;with call bell/phone within reach Nurse Communication: Mobility status PT Visit Diagnosis: Difficulty in walking, not elsewhere classified (R26.2);Other abnormalities of gait and mobility (R26.89);Muscle weakness (generalized) (M62.81) Pain - Right/Left: Right Pain - part of body: Hip     Time: 8753-8690 PT Time Calculation (min) (ACUTE ONLY): 23 min  Charges:    $Therapeutic Activity: 23-37 mins PT General Charges $$ ACUTE PT VISIT: 1 Visit                    [name], DPT, CLT  Acute Rehabilitation Services Office: [phone] (Secure chat preferred)    [name] 06/30/2023, 1:13 PM

## 2023-06-30 NOTE — Progress Notes (Signed)
 Subjective  - POD #2  Pain controlled   Physical Exam:  Vac to right hip Vascular incisions healing appropriately Left leg warm well-perfused       Assessment/Plan:    All infected grafts have been removed.  She has a new left axillary femoral graft.  Appreciate Dr. Crist help with management of the right leg and subsequent hip disarticulation.  Katherine Archer 06/30/2023 9:49 AM --  Vitals:   06/30/23 0417 06/30/23 0811  BP: (!) 122/57 (!) 137/45  Pulse: 79 73  Resp: 18   Temp: 100.1 F (37.8 C) 98.2 F (36.8 C)  SpO2: 93% 100%    Intake/Output Summary (Last 24 hours) at 06/30/2023 0949 Last data filed at 06/30/2023 0445 Gross per 24 hour  Intake 894.23 ml  Output 2575 ml  Net -1680.77 ml     Laboratory CBC    Component Value Date/Time   WBC 18.7 (H) 06/30/2023 0419   HGB 7.4 (L) 06/30/2023 0419   HGB 14.2 03/23/2021 0928   HCT 23.8 (L) 06/30/2023 0419   HCT 42.1 03/23/2021 0928   PLT 356 06/30/2023 0419   PLT 135 (L) 03/23/2021 0928    BMET    Component Value Date/Time   NA 131 (L) 06/30/2023 0419   NA 138 01/15/2020 1128   K 4.3 06/30/2023 0419   CL 94 (L) 06/30/2023 0419   CO2 27 06/30/2023 0419   GLUCOSE 97 06/30/2023 0419   BUN 11 06/30/2023 0419   BUN 8 01/15/2020 1128   CREATININE 0.51 06/30/2023 0419   CALCIUM  8.6 (L) 06/30/2023 0419   GFRNONAA >60 06/30/2023 0419   GFRAA 80 01/15/2020 1128    COAG Lab Results  Component Value Date   INR 1.1 06/29/2023   INR 1.5 (H) 05/29/2023   INR 1.1 05/11/2023   No results found for: PTT  Antibiotics Anti-infectives (From admission, onward)    Start     Dose/Rate Route Frequency Ordered Stop   06/28/23 1345  fluconazole  (DIFLUCAN ) tablet 100 mg        100 mg Oral Daily 06/28/23 1254     06/28/23 0941  vancomycin  (VANCOCIN ) powder  Status:  Discontinued          As needed 06/28/23 0942 06/28/23 1029   06/28/23 0746  ceFAZolin  (ANCEF ) IVPB 2g/100 mL premix        2 g 200  mL/hr over 30 Minutes Intravenous On call to O.R. 06/27/23 1246 06/28/23 0956   06/25/23 0500  vancomycin  (VANCOREADY) IVPB 750 mg/150 mL        750 mg 150 mL/hr over 60 Minutes Intravenous Every 12 hours 06/24/23 1204     06/24/23 1000  vancomycin  (VANCOREADY) IVPB 1250 mg/250 mL  Status:  Discontinued        1,250 mg 166.7 mL/hr over 90 Minutes Intravenous Every 24 hours 06/23/23 1423 06/24/23 1204   06/23/23 2200  linezolid  (ZYVOX ) tablet 600 mg  Status:  Discontinued        600 mg Oral Every 12 hours 06/23/23 1321 06/23/23 1423   06/18/23 1000  vancomycin  (VANCOREADY) IVPB 1250 mg/250 mL  Status:  Discontinued        1,250 mg 166.7 mL/hr over 90 Minutes Intravenous Every 24 hours 06/17/23 2249 06/23/23 1321   06/17/23 1000  Vancomycin  (VANCOCIN ) 1,250 mg in sodium chloride  0.9 % 250 mL IVPB  Status:  Discontinued        1,250 mg 166.7 mL/hr over 90 Minutes Intravenous Every  24 hours 06/16/23 1718 06/17/23 2248        V. Malvina Serene CLORE, M.D., [facility] [place] Office: [phone] Pager:  [phone]

## 2023-06-30 NOTE — Progress Notes (Signed)
 [facility] [place]  Subjective  - sitting up eating.  No complaints.   Objective (!) 137/45 73 98.2 F (36.8 C) (Oral) 18 100%  Intake/Output Summary (Last 24 hours) at 06/30/2023 0940 Last data filed at 06/30/2023 0445 Gross per 24 hour  Intake 894.23 ml  Output 2575 ml  Net -1680.77 ml    Right hip vac with suction in place not a good suction fit. Will maintain. Total 100 cc OP last 24 hours.   Assessment/Planning: Gangrene Right Above Knee Amputation   S/P Hip Disarticulation with tissue graft Kerecis and Vancomycin  powder followed by wound vac.   Vac 100 cc OP in vac last 24 hours   Plan will be to discharge vac and start dry dressing changes at discharge.   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) s/p multiple vascular interventions including AKA with eventual removal of complete removal of rt axillary fem graft, rt to left fem fem bypass graft ( confirmed with Vascular) + placement of  left fem to common femoral artery bypass graft, finally leading to Left hip disarticulation for source control ( no concerns for remaining infection per Dr Harden)   # secondary Group A strep bacteremia 2/2 above   Pending discharge disposition  Katherine Archer 06/30/2023 9:40 AM --  Laboratory Lab Results: Recent Labs    06/29/23 2000 06/30/23 0419  WBC 18.6* 18.7*  HGB 7.9* 7.4*  HCT 24.7* 23.8*  PLT 362 356   BMET Recent Labs    06/29/23 0507 06/30/23 0419  NA 132* 131*  K 4.4 4.3  CL 98 94*  CO2 25 27  GLUCOSE 114* 97  BUN 8 11  CREATININE 0.49 0.51  CALCIUM  9.1 8.6*    COAG Lab Results  Component Value Date   INR 1.1 06/29/2023   INR 1.5 (H) 05/29/2023   INR 1.1 05/11/2023   No results found for: PTT

## 2023-06-30 NOTE — Progress Notes (Signed)
 Nutrition Follow-up  DOCUMENTATION CODES:  Severe malnutrition in context of chronic illness  INTERVENTION:  Continue Ensure Plus High Protein po BID, each supplement provides 350 kcal and 20 grams of protein Continue Magic cup TID with meals, each supplement provides 290 kcal and 9 grams of protein Continue [place] Yogurt TID with meals Contiue Vitamin C  250 mg BID for wound healing Multivitamin w/ minerals daily   NUTRITION DIAGNOSIS:  Severe Malnutrition related to chronic illness (CVA, PAD) as evidenced by severe muscle depletion, severe fat depletion. - diagnosis updated 6/27  GOAL:  Patient will meet greater than or equal to 90% of their needs - unmet, progressing, addressing via meals and nutrition supplements  MONITOR:  PO intake, Supplement acceptance, Labs, Weight trends, Skin  REASON FOR ASSESSMENT:  Consult Wound healing  ASSESSMENT:  68 year old female who was admitted to acute care from CIR on 06/16/23 due to R groin surgical site infection and planned I&D. 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  6/13 - s/p I&D of right groin, placement of skin substitute, placement of wound VAC to right groin, redo R AKA; full liquid diet  6/14 - diet advanced to Heart Healthy 6/18 - Op, s/p  Left axillary to left common femoral artery bypass graft with 6 mm external ring PTFE, Redo exposure left common femoral artery & right axillary artery, Removal of right axillary femoral graft & right to left femoral-femoral bypass graft, I&D of right groin incision & R above-knee amputation, Placement of first 38 cm skin substitute (Kerecis), and Wound VAC to right groin (13 x 3 x 2 cm) 6/25 - s/p R hip disarticulation, wound VAC  Spoke with pt at bedside.  She is in good spirits. Noted snacks on bedside table.  Pt reports that she eats little but eats until she is comfortable as she doesn't want to eat too much. She endorses  continuing to consume the Ensure protein supplements about one to two times daily.   No documented output in flowsheet documentation from wound VAC  Admit weight: 64.1 kg Current weight: 59.1 kg  Medications: vitamin C  250mg  BID, diflucan , MVI, miralax , senna BID  Labs Sodium 131 Hgb 7.4  NUTRITION - FOCUSED PHYSICAL EXAM: Flowsheet Row Most Recent Value  Orbital Region Severe depletion  Upper Arm Region Severe depletion  Thoracic and Lumbar Region Severe depletion  Buccal Region Severe depletion  Temple Region Severe depletion  Clavicle Bone Region Severe depletion  Clavicle and Acromion Bone Region Severe depletion  Scapular Bone Region Severe depletion  Dorsal Hand Severe depletion  Patellar Region Severe depletion  Anterior Thigh Region Severe depletion  Posterior Calf Region Severe depletion    Diet Order:   Diet Order             Diet regular Room service appropriate? Yes; Fluid consistency: Thin  Diet effective now                   EDUCATION NEEDS:  Not appropriate for education at this time  Skin:  Skin Assessment: Skin Integrity Issues: Skin Integrity Issues:: Wound VAC, Other (Comment) Wound Vac: R groin Other: R AKA s/p redo  Last BM:  6/26  Height:  Ht Readings from Last 1 Encounters:  06/28/23 (P) 5' 6 (1.676 m)    Weight:  Wt Readings from Last 1 Encounters:  06/30/23 59.1 kg    Ideal Body Weight:  59.1 kg  BMI:  Body mass index is  21.03 kg/m (pended).  Estimated Nutritional Needs:   Kcal:  1800-2000  Protein:  85-100 grams  Fluid:  >1.8 L  Katherine Archer, RDN, LDN Clinical Nutrition See AMiON for contact information.

## 2023-06-30 NOTE — Progress Notes (Signed)
 Mobility Specialist Progress Note:    06/30/23 1352  Mobility  Activity Transferred from chair to bed  Level of Assistance Contact guard assist, steadying assist  Assistive Device Front wheel walker  Distance Ambulated (ft) 4 ft  RLE Weight Bearing Per Provider Order NWB  Activity Response Tolerated well  Mobility Referral Yes  Mobility visit 1 Mobility  Mobility Specialist Start Time (ACUTE ONLY) 1352  Mobility Specialist Stop Time (ACUTE ONLY) 1400  Mobility Specialist Time Calculation (min) (ACUTE ONLY) 8 min   Pt received in chair, agreeable to mobility session. Transferred C.B via CGA and RW. Tolerated well, asx throughout. Lying comfortably in bed with all needs met. Bed alarm on.  Cherell Colvin Mobility Specialist Please contact via Special educational needs teacher or  Rehab office at [phone]

## 2023-06-30 NOTE — Progress Notes (Signed)
 Inpatient Rehab Admissions Coordinator:   Following for my colleague, Heron Leavell.  Pt refused OT today.  Unable to send for insurance authorization without ability to demonstrate therapy needs in >1 discipline (need either OT or SLP).  We will have to try again on Monday.    Reche Lowers, PT, DPT Admissions Coordinator [phone] 06/30/23  3:45 PM

## 2023-07-01 LAB — TYPE AND SCREEN
Unit division: 0
Unit division: 0

## 2023-07-01 LAB — BASIC METABOLIC PANEL WITH GFR
Anion gap: 6 (ref 5–15)
BUN: 6 mg/dL — ABNORMAL LOW (ref 8–23)
CO2: 29 mmol/L (ref 22–32)
Calcium: 8.8 mg/dL — ABNORMAL LOW (ref 8.9–10.3)
Chloride: 96 mmol/L — ABNORMAL LOW (ref 98–111)
Creatinine, Ser: 0.49 mg/dL (ref 0.44–1.00)
GFR, Estimated: >60 mL/min (ref >60)
Glucose, Bld: 119 mg/dL — ABNORMAL HIGH (ref 70–99)
Potassium: 4.1 mmol/L (ref 3.5–5.1)
Sodium: 131 mmol/L — ABNORMAL LOW (ref 135–145)

## 2023-07-01 LAB — BPAM RBC
Blood Product Expiration Date: 202507112359
Blood Product Expiration Date: 202507232359
ISSUE DATE / TIME: 202506210913
ISSUE DATE / TIME: 202506241127
Unit Type and Rh: 5100
Unit Type and Rh: 5100

## 2023-07-01 LAB — CBC
HCT: 22.8 % — ABNORMAL LOW (ref 36.0–46.0)
Hemoglobin: 7.2 g/dL — ABNORMAL LOW (ref 12.0–15.0)
MCH: 29.1 pg (ref 26.0–34.0)
MCHC: 31.6 g/dL (ref 30.0–36.0)
MCV: 92.3 fL (ref 80.0–100.0)
Platelets: 313 10*3/uL (ref 150–400)
RBC: 2.47 MIL/uL — ABNORMAL LOW (ref 3.87–5.11)
RDW: 15 % (ref 11.5–15.5)
WBC: 16.2 10*3/uL — ABNORMAL HIGH (ref 4.0–10.5)
nRBC: 0 % (ref 0.0–0.2)

## 2023-07-01 LAB — MAGNESIUM: Magnesium: 2 mg/dL (ref 1.7–2.4)

## 2023-07-01 MED ORDER — HEPARIN SODIUM (PORCINE) 5000 UNIT/ML IJ SOLN
5000.0000 [IU] | Freq: Two times a day (BID) | INTRAMUSCULAR | Status: DC
  Administered 2023-07-01 – 2023-07-02 (×4): 5000 [IU] via SUBCUTANEOUS
  Filled 2023-07-01 (×4): qty 1

## 2023-07-01 MED ORDER — FLUCONAZOLE 150 MG PO TABS
150.0000 mg | ORAL_TABLET | ORAL | Status: AC
  Administered 2023-07-01 – 2023-07-04 (×2): 150 mg via ORAL
  Filled 2023-07-01 (×2): qty 1

## 2023-07-01 MED ORDER — HYDROMORPHONE HCL 1 MG/ML IJ SOLN
0.5000 mg | INTRAMUSCULAR | Status: DC | PRN
  Administered 2023-07-02 – 2023-07-04 (×6): 0.5 mg via INTRAVENOUS
  Filled 2023-07-01 (×7): qty 0.5

## 2023-07-01 NOTE — Progress Notes (Signed)
 Doctors need to look at the incision on the patient's right axillary area.  The incision looks crusty and inflamed.  Will inform on coming nurse.  Kristene Sotero BRAVO, RN

## 2023-07-01 NOTE — Evaluation (Signed)
 Occupational Therapy Evaluation Patient Details Name: Katherine Archer MRN: [id number] DOB: [birth year] Today's Date: 07/01/2023   History of Present Illness   Pt is a 68 y/o F admitted from CIR to acute services on 06/16/23. 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 Pt underwent R groin I&D & R AKA revision on 06/16/23, removal of R axillofemoral & femorofemoral bypass on 06/21/23. Pt underwent R hip disarticulation on 06/28/23. PMH: HLD, HTN, PAD, stroke     Clinical Impressions Pt admitted based on above, and was seen based on problem list below. PTA pt was completing CIR rehab, progressing towards Mod I with use of w/c. Today pt is requiring set up  to CGA for ADLs. Bed mobility and functional transfers are  CGA. Pt reporting progressing well while at CIR. Pt would highly benefit from return to >3 hours of skilled rehab daily. OT will continue to follow acutely to maximize functional independence.        If plan is discharge home, recommend the following:   A little help with walking and/or transfers;Assistance with cooking/housework;Assist for transportation;Help with stairs or ramp for entrance;A lot of help with bathing/dressing/bathroom     Functional Status Assessment   Patient has had a recent decline in their functional status and demonstrates the ability to make significant improvements in function in a reasonable and predictable amount of time.     Equipment Recommendations   Other (comment) (Defer to next venue)     Recommendations for Other Services   Rehab consult     Precautions/Restrictions   Precautions Precautions: Fall Recall of Precautions/Restrictions: Intact Precaution/Restrictions Comments: wound vac Restrictions Weight Bearing Restrictions Per Provider Order: Yes RLE Weight Bearing Per Provider Order: Non weight bearing     Mobility Bed  Mobility Overal bed mobility: Needs Assistance Bed Mobility: Supine to Sit     Supine to sit: Supervision, HOB elevated, Used rails     General bed mobility comments: increased time    Transfers Overall transfer level: Needs assistance Equipment used: Rolling walker (2 wheels) Transfers: Sit to/from Stand, Bed to chair/wheelchair/BSC Sit to Stand: Contact guard assist Stand pivot transfers: Contact guard assist         General transfer comment: CGA for balance, SPT to recliner      Balance Overall balance assessment: Needs assistance Sitting-balance support: Feet supported, Bilateral upper extremity supported Sitting balance-Leahy Scale: Fair Sitting balance - Comments: supervision static sitting   Standing balance support: Bilateral upper extremity supported, During functional activity, Reliant on assistive device for balance Standing balance-Leahy Scale: Poor Standing balance comment: requires BUE support for balance and Min A       ADL either performed or assessed with clinical judgement   ADL Overall ADL's : Needs assistance/impaired Eating/Feeding: Set up;Sitting   Grooming: Wash/dry face;Oral care;Set up;Sitting   Upper Body Bathing: Set up;Sitting   Lower Body Bathing: Contact guard assist;Sit to/from stand   Upper Body Dressing : Set up;Sitting   Lower Body Dressing: Contact guard assist;Sit to/from stand Lower Body Dressing Details (indicate cue type and reason): pt able to reach LLE to thread pants, in standing fair balance for task Toilet Transfer: Contact guard assist;Stand-pivot;Rolling walker (2 wheels) Toilet Transfer Details (indicate cue type and reason): Simulated in room Toileting- Clothing Manipulation and Hygiene: Sitting/lateral lean;Contact guard assist Toileting - Clothing Manipulation Details (indicate cue type and reason): CGA for balance  Functional mobility during ADLs: Contact guard assist;Rolling walker (2 wheels) General ADL  Comments: Pt with adequate standing balance to progress ADLs     Vision Baseline Vision/History: 0 No visual deficits Vision Assessment?: No apparent visual deficits            Pertinent Vitals/Pain Pain Assessment Pain Assessment: Faces Faces Pain Scale: Hurts a little bit Pain Location: R hip/surgical site Pain Descriptors / Indicators: Grimacing, Guarding, Discomfort Pain Intervention(s): Monitored during session     Extremity/Trunk Assessment Upper Extremity Assessment Upper Extremity Assessment: Generalized weakness   Lower Extremity Assessment Lower Extremity Assessment: Defer to PT evaluation   Cervical / Trunk Assessment Cervical / Trunk Assessment: Normal   Communication Communication Communication: No apparent difficulties Factors Affecting Communication: Non - English speaking, interpreter not available (Pt's daughter interpretting over the phone)   Cognition Arousal: Alert Behavior During Therapy: WFL for tasks assessed/performed Cognition: No apparent impairments       Following commands: Intact       Cueing  General Comments   Cueing Techniques: Verbal cues  Wound vac intact, daughter translating via phone           Home Living Family/patient expects to be discharged to:: Private residence Living Arrangements: Children;Other relatives Available Help at Discharge: Family;Available 24 hours/day Type of Home: Mobile home Home Access: Ramped entrance;Stairs to enter Entrance Stairs-Number of Steps: 5 Entrance Stairs-Rails: Right;Left;Can reach both Home Layout: One level     Bathroom Shower/Tub: Sponge bathes at baseline   Bathroom Toilet: Standard Bathroom Accessibility: Yes How Accessible: Accessible via walker Home Equipment: Cane - single point;Rolling Walker (2 wheels)   Additional Comments: Pt admitted from CIR, progressing towards Mod I at w/c level      Prior Functioning/Environment Prior Level of Function :  Independent/Modified Independent       Mobility Comments: Usually alternates between using SPC and RW ADLs Comments: Pt typically Independent with ADLs; prn for ADLs    OT Problem List: Decreased activity tolerance;Impaired balance (sitting and/or standing);Decreased strength;Decreased range of motion;Decreased safety awareness   OT Treatment/Interventions: Self-care/ADL training;Energy conservation;DME and/or AE instruction;Therapeutic activities;Patient/family education;Balance training      OT Goals(Current goals can be found in the care plan section)   Acute Rehab OT Goals Patient Stated Goal: To go back to rehab OT Goal Formulation: With patient Time For Goal Achievement: 07/15/23 Potential to Achieve Goals: Good   OT Frequency:  Min 2X/week       AM-PAC OT 6 Clicks Daily Activity     Outcome Measure Help from another person eating meals?: None Help from another person taking care of personal grooming?: A Little Help from another person toileting, which includes using toliet, bedpan, or urinal?: A Little Help from another person bathing (including washing, rinsing, drying)?: A Little Help from another person to put on and taking off regular upper body clothing?: A Little Help from another person to put on and taking off regular lower body clothing?: A Little 6 Click Score: 19   End of Session Equipment Utilized During Treatment: Rolling walker (2 wheels) Nurse Communication: Mobility status  Activity Tolerance: Patient tolerated treatment well Patient left: in chair;with call bell/phone within reach;with nursing/sitter in room  OT Visit Diagnosis: Unsteadiness on feet (R26.81)                Time: 8878-8856 OT Time Calculation (min): 22 min Charges:  OT General Charges $OT Visit: 1 Visit OT Evaluation $OT Eval Moderate Complexity: 1 Mod  Ziyon Cedotal  JAYSON, OT  Acute Rehabilitation Services Office [phone] Secure chat preferred   Adrianne GORMAN Savers 07/01/2023,  1:02 PM

## 2023-07-01 NOTE — Progress Notes (Signed)
 [facility] Progress Note Patient: Katherine Archer FMW:[id number] DOB: 18-Dec-1955 DOA: 06/16/2023  DOS: the patient was seen and examined on 07/01/2023  Brief Hospital Course: PMH of HTN, PAD, HLD, CVA, presented with wound dehiscence. Vascular surgery as well as orthopedic surgery were consulted.  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 Underwent AKA and hip disarticulation. Currently on IV vancomycin .  ID consulted.  Appreciate assistance. Monitor.  On Zyvox .  PAD. Vascular surgery following. Underwent left common femoral artery bypass graft and I&D of the right AKA. Currently has a wound VAC.  Postop nausea. X-ray abdomen unremarkable.  Vaginal candidiasis. Initiating Diflucan .  Vaginal pessary. Falling  behind on exchange. Appreciate GYN consultation. Outpatient follow-up recommended.  HTN. Blood pressure stable. Continue home regimen.  Anemia secondary to acute blood loss. Monitor H&H. Transfuse for hemoglobin less than 7.  1 PRBC given on 6/26.  Severe protein calorie malnutrition. Dietitian following. Placing the patient at a high risk for poor outcome.  Hyponatremia. Clinically nonsignificant.  Leukocytosis. Likely stress reaction. Monitor.  DVT prophylaxis. Family had questions about patient's DVT prophylaxis and requested to switch to pills. I explained that given concern regards to anemia at present a pill DVT prophylaxis would not be safe due to long-acting nature of the medicine as well as proceed with a higher dose than heparin  5000 units twice daily. Family understood.  PICC line. Currently does not require central access. On oral antibiotics. Will request PICC line removal although will still require PIV given need for possible transfusion due to anemia.   Subjective: No nausea vomiting no fever no chills.  No chest pain.  Pain well-controlled.  Daughter had concerns regards to DVT prophylaxis as  above.  Physical Exam: General: in Mild distress, No Rash Cardiovascular: S1 and S2 Present, No Murmur Respiratory: Good respiratory effort, Bilateral Air entry present. No Crackles, No wheezes Abdomen: Bowel Sound present, No tenderness Extremities: No edema, right AKA.  Wound VAC still with serosanguineous output. Neuro: Alert and oriented x3, no new focal deficit  Data Reviewed: I have Reviewed nursing notes, Vitals, and Lab results. Since last encounter, pertinent lab results CBC and BMP   . I have ordered test including CBC and BMP  .   Disposition: Status is: Inpatient Remains inpatient appropriate because: Monitor for stabilization of H&H and output from the wound VAC.  heparin  injection 5,000 Units Start: 07/01/23 1000 SCD's Start: 06/28/23 1155   Family Communication: Daughter on the phone at the time of the interview. Level of care: Progressive   Vitals:   07/01/23 1403 07/01/23 1404 07/01/23 1709 07/01/23 1710  BP: (!) 126/44 (!) 126/44 (!) 132/50 (!) 132/50  Pulse: 81  82   Resp: 16   15  Temp:   99 F (37.2 C)   TempSrc:   Oral   SpO2: 95%  96%   Weight:      Height:         Author: Yetta Blanch, MD 07/01/2023 5:53 PM  Please look on [email or web address] to find out who is on call.

## 2023-07-01 NOTE — Progress Notes (Addendum)
  Progress Note    07/01/2023 8:24 AM 3 Days Post-Op  Subjective:  says she has been having some intermittent sharp pains in her left 1st-3rd toes, comes and goes. Otherwise most of her pain is in area of right hip disarticulation   Vitals:   06/30/23 1941 07/01/23 0202  BP: (!) 127/45 (!) 116/47  Pulse: 73 (!) 138  Resp: 20 20  Temp: 98.5 F (36.9 C) 98.8 F (37.1 C)  SpO2: 98% 100%   Physical Exam: Cardiac:  regular  Lungs:  non labored Incisions:  right hip disarticulation with vac to suction; Left Axillary incision and groin incisions well appearing, slight erythema around axillary incision but does not appear infected. Possibly just irritation from Dermabond Extremities:  LLE well perfused with PT doppler signal, foot warm and well perfused Abdomen:  soft Neurologic: alert and oriented  CBC    Component Value Date/Time   WBC 17.4 (H) 06/30/2023 1652   RBC 2.69 (L) 06/30/2023 1652   HGB 7.8 (L) 06/30/2023 1652   HGB 14.2 03/23/2021 0928   HCT 24.3 (L) 06/30/2023 1652   HCT 42.1 03/23/2021 0928   PLT 319 06/30/2023 1652   PLT 135 (L) 03/23/2021 0928   MCV 90.3 06/30/2023 1652   MCV 93 03/23/2021 0928   MCH 29.0 06/30/2023 1652   MCHC 32.1 06/30/2023 1652   RDW 15.2 06/30/2023 1652   RDW 12.0 03/23/2021 0928   LYMPHSABS 2.7 06/30/2023 0419   LYMPHSABS 1.9 03/23/2021 0928   MONOABS 1.5 (H) 06/30/2023 0419   EOSABS 0.1 06/30/2023 0419   EOSABS 0.3 03/23/2021 0928   BASOSABS 0.0 06/30/2023 0419   BASOSABS 0.0 03/23/2021 0928    BMET    Component Value Date/Time   NA 131 (L) 06/30/2023 0419   NA 138 01/15/2020 1128   K 4.3 06/30/2023 0419   CL 94 (L) 06/30/2023 0419   CO2 27 06/30/2023 0419   GLUCOSE 97 06/30/2023 0419   BUN 11 06/30/2023 0419   BUN 8 01/15/2020 1128   CREATININE 0.51 06/30/2023 0419   CALCIUM  8.6 (L) 06/30/2023 0419   GFRNONAA >60 06/30/2023 0419   GFRAA 80 01/15/2020 1128    INR    Component Value Date/Time   INR 1.1  06/29/2023 0947     Intake/Output Summary (Last 24 hours) at 07/01/2023 0824 Last data filed at 06/30/2023 1400 Gross per 24 hour  Intake --  Output 1750 ml  Net -1750 ml     Assessment/Plan:  68 y.o. female is s/p excision of infected R ax-bifemoral bypass with left axillary to CF bypass graft 6/18. S/p hip Hip disarticulation by Dr. Harden   Incisions are all well appearing LLE well perfused and warm with doppler PT signal Afebrile. WBC overall stable around 18K Abx changed to linezolid  BID - 7/9 per ID H&H stable Appreciate Dr. Harden assistance with right hip disarticulation   Teretha Damme, PA-C [facility] [phone] 07/01/2023 8:24 AM  VASCULAR STAFF ADDENDUM: I have independently interviewed and examined the patient. I agree with the above.  Incisions healing well. PT signal present on Left  Norman GORMAN Serve MD [facility] Phone Number: [phone] 07/01/2023 8:34 AM

## 2023-07-02 LAB — CBC
HCT: 21.5 % — ABNORMAL LOW (ref 36.0–46.0)
HCT: 21.8 % — ABNORMAL LOW (ref 36.0–46.0)
Hemoglobin: 7 g/dL — ABNORMAL LOW (ref 12.0–15.0)
Hemoglobin: 6.9 g/dL — CL (ref 12.0–15.0)
MCH: 29.5 pg (ref 26.0–34.0)
MCH: 29.2 pg (ref 26.0–34.0)
MCHC: 32.6 g/dL (ref 30.0–36.0)
MCHC: 31.7 g/dL (ref 30.0–36.0)
MCV: 90.7 fL (ref 80.0–100.0)
MCV: 92.4 fL (ref 80.0–100.0)
Platelets: 315 10*3/uL (ref 150–400)
Platelets: 332 10*3/uL (ref 150–400)
RBC: 2.37 MIL/uL — ABNORMAL LOW (ref 3.87–5.11)
RBC: 2.36 MIL/uL — ABNORMAL LOW (ref 3.87–5.11)
RDW: 15 % (ref 11.5–15.5)
RDW: 15.1 % (ref 11.5–15.5)
WBC: 15.1 10*3/uL — ABNORMAL HIGH (ref 4.0–10.5)
WBC: 14.5 10*3/uL — ABNORMAL HIGH (ref 4.0–10.5)
nRBC: 0 % (ref 0.0–0.2)
nRBC: 0 % (ref 0.0–0.2)

## 2023-07-02 LAB — PREPARE RBC (CROSSMATCH)

## 2023-07-02 MED ORDER — FE FUM-VIT C-VIT B12-FA 460-60-0.01-1 MG PO CAPS
1.0000 | ORAL_CAPSULE | Freq: Every day | ORAL | Status: DC
  Administered 2023-07-02 – 2023-07-04 (×3): 1 via ORAL
  Filled 2023-07-02 (×3): qty 1

## 2023-07-02 MED ORDER — SODIUM CHLORIDE 0.9% IV SOLUTION: Freq: Once | INTRAVENOUS | Status: DC

## 2023-07-02 NOTE — Plan of Care (Signed)
  Problem: Health Behavior/Discharge Planning: Goal: Ability to manage health-related needs will improve Outcome: Progressing   Problem: Clinical Measurements: Goal: Will remain free from infection Outcome: Progressing Goal: Respiratory complications will improve Outcome: Progressing   Problem: Activity: Goal: Risk for activity intolerance will decrease Outcome: Not Progressing   Problem: Pain Managment: Goal: General experience of comfort will improve and/or be controlled Outcome: Not Progressing   Problem: Skin Integrity: Goal: Risk for impaired skin integrity will decrease Outcome: Not Progressing

## 2023-07-02 NOTE — Progress Notes (Signed)
 Lab called with critical hemoglobin of 6.9. Dr. Tobie notified. New orders for blood transfusion received. Plan of care continues.

## 2023-07-02 NOTE — Progress Notes (Signed)
 Mobility Specialist Progress Note;   07/02/23 1126  Mobility  Activity Transferred from bed to chair  Level of Assistance Contact guard assist, steadying assist  Assistive Device Front wheel walker  Distance Ambulated (ft) 3 ft  RLE Weight Bearing Per Provider Order NWB  Activity Response Tolerated well  Mobility Referral Yes  Mobility visit 1 Mobility  Mobility Specialist Start Time (ACUTE ONLY) 1126  Mobility Specialist Stop Time (ACUTE ONLY) 1138  Mobility Specialist Time Calculation (min) (ACUTE ONLY) 12 min   Pt agreeable to mobility. Required light MinG assistance to safely transfer from bed to chair. VSS throughout and no c/o when asked. Pt left in chair with all needs met, call bell in reach.   Lauraine Erm Mobility Specialist Please contact via SecureChat or [employer] [phone]

## 2023-07-02 NOTE — Plan of Care (Signed)
  Problem: Health Behavior/Discharge Planning: Goal: Ability to manage health-related needs will improve Outcome: Progressing   Problem: Clinical Measurements: Goal: Ability to maintain clinical measurements within normal limits will improve Outcome: Progressing Goal: Will remain free from infection Outcome: Progressing Goal: Diagnostic test results will improve Outcome: Progressing Goal: Respiratory complications will improve Outcome: Progressing Goal: Cardiovascular complication will be avoided Outcome: Progressing   Problem: Activity: Goal: Risk for activity intolerance will decrease Outcome: Progressing   Problem: Nutrition: Goal: Adequate nutrition will be maintained Outcome: Progressing   Problem: Coping: Goal: Level of anxiety will decrease Outcome: Progressing   Problem: Pain Managment: Goal: General experience of comfort will improve and/or be controlled Outcome: Progressing   Problem: Safety: Goal: Ability to remain free from injury will improve Outcome: Progressing   Problem: Skin Integrity: Goal: Risk for impaired skin integrity will decrease Outcome: Progressing   Problem: Education: Goal: Knowledge of the prescribed therapeutic regimen will improve Outcome: Progressing Goal: Ability to verbalize activity precautions or restrictions will improve Outcome: Progressing Goal: Understanding of discharge needs will improve Outcome: Progressing   Problem: Activity: Goal: Ability to perform//tolerate increased activity and mobilize with assistive devices will improve Outcome: Progressing   Problem: Clinical Measurements: Goal: Postoperative complications will be avoided or minimized Outcome: Progressing   Problem: Self-Care: Goal: Ability to meet self-care needs will improve Outcome: Progressing   Problem: Self-Concept: Goal: Ability to maintain and perform role responsibilities to the fullest extent possible will improve Outcome: Progressing    Problem: Pain Management: Goal: Pain level will decrease with appropriate interventions Outcome: Progressing   Problem: Skin Integrity: Goal: Demonstration of wound healing without infection will improve Outcome: Progressing

## 2023-07-02 NOTE — Progress Notes (Signed)
 [facility] Progress Note Patient: Katherine Archer FMW:[id number] DOB: [birth year] DOA: 06/16/2023  DOS: the patient was seen and examined on 07/02/2023  Brief Hospital Course: PMH of HTN, PAD, HLD, CVA, presented with wound dehiscence. Vascular surgery as well as orthopedic surgery were consulted. 1/24 right femoral endarterectomy and above-knee popliteal bypass 5/9 right femoral thrombectomy, right below-knee popliteal bypass 5/26 ligation of the bypass and redo above and below knee popliteal bypass. 5/28 I&D of right groin wound.  With sartorius muscle flap.  As well as right AKA. 6/13 redo right AKA with I&D. 6/18 removal of the bypass graft and I&D of the AKA. 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 Was on IV vancomycin  now on oral Zyvox . ID consult appreciated. Vascular surgery as well as orthopedic surgery following. Current concern is with regards to wound VAC output, will monitor improvement before discharge planning.  PAD. Vascular surgery following. Extensive vascular surgery requiring multiple redo procedures as well as graft removal. Currently has a wound VAC.  Postop nausea. X-ray abdomen unremarkable.  Vaginal candidiasis. Diflucan  for 1 week.  Vaginal pessary. Falling  behind on exchange. Appreciate GYN consultation. Outpatient follow-up recommended.  HTN. Blood pressure stable. Continue home regimen.  Anemia secondary to acute blood loss. Monitor H&H. Transfuse for hemoglobin less than 7.  1 PRBC given on 6/26.  Severe protein calorie malnutrition. Dietitian following. Placing the patient at a high risk for poor outcome.  Hyponatremia. Clinically nonsignificant.  Leukocytosis. Likely stress reaction. Monitor.  DVT prophylaxis. Family had questions about patient's DVT prophylaxis and requested to switch to pills. I  explained that given concern regards to anemia at present a pill DVT prophylaxis would not be safe due to long-acting nature of the medicine as well as proceed with a higher dose than heparin  5000 units twice daily. Family understood.  PICC line. Currently does not require central access.  Must require IV access therefore we will continue PICC line.  Discussed with IV team.   Subjective: Pain well-controlled although the time of my evaluation patient was receiving IV Dilaudid .  No nausea or vomiting no fever no chills.  No diarrhea with blood.  Physical Exam: In moderate distress. No rash. Minimal wound VAC output for now. Bowel sound present. Incisional tenderness unchanged.  Data Reviewed: I have Reviewed nursing notes, Vitals, and Lab results. Since last encounter, pertinent lab results CBC and BMP   . I have ordered test including CBC and BMP  .   Disposition: Status is: Inpatient Remains inpatient appropriate because: Monitor for stabilization of H&H wound VAC output  heparin  injection 5,000 Units Start: 07/01/23 1000 SCD's Start: 06/28/23 1155   Family Communication: No one at bedside Level of care: Progressive   Vitals:   07/02/23 0300 07/02/23 0309 07/02/23 0901 07/02/23 1308  BP:  (!) 135/52 (!) 118/48 128/68  Pulse:  72 70 72  Resp: 19 20 (!) 22 19  Temp:  98.3 F (36.8 C) 98.5 F (36.9 C) 98.5 F (36.9 C)  TempSrc:  Oral Oral Oral  SpO2:  96% 95% 98%  Weight:      Height:         Author: Yetta Blanch, MD 07/02/2023 1:53 PM  Please look on [email or web address] to find out who is on call.

## 2023-07-03 LAB — BASIC METABOLIC PANEL WITH GFR
Anion gap: 8 (ref 5–15)
BUN: 7 mg/dL — ABNORMAL LOW (ref 8–23)
CO2: 27 mmol/L (ref 22–32)
Calcium: 8.6 mg/dL — ABNORMAL LOW (ref 8.9–10.3)
Chloride: 95 mmol/L — ABNORMAL LOW (ref 98–111)
Creatinine, Ser: 0.44 mg/dL (ref 0.44–1.00)
GFR, Estimated: >60 mL/min (ref >60)
Glucose, Bld: 106 mg/dL — ABNORMAL HIGH (ref 70–99)
Potassium: 4.3 mmol/L (ref 3.5–5.1)
Sodium: 130 mmol/L — ABNORMAL LOW (ref 135–145)

## 2023-07-03 LAB — CBC
HCT: 26.6 % — ABNORMAL LOW (ref 36.0–46.0)
Hemoglobin: 8.8 g/dL — ABNORMAL LOW (ref 12.0–15.0)
MCH: 28.9 pg (ref 26.0–34.0)
MCHC: 33.1 g/dL (ref 30.0–36.0)
MCV: 87.2 fL (ref 80.0–100.0)
Platelets: 314 10*3/uL (ref 150–400)
RBC: 3.05 MIL/uL — ABNORMAL LOW (ref 3.87–5.11)
RDW: 15.7 % — ABNORMAL HIGH (ref 11.5–15.5)
WBC: 14 10*3/uL — ABNORMAL HIGH (ref 4.0–10.5)
nRBC: 0 % (ref 0.0–0.2)

## 2023-07-03 LAB — TYPE AND SCREEN
ABO/RH(D): O POS
Antibody Screen: NEGATIVE
Unit division: 0

## 2023-07-03 LAB — BPAM RBC
Blood Product Expiration Date: 202508032359
ISSUE DATE / TIME: 202506291827
Unit Type and Rh: 5100

## 2023-07-03 NOTE — Progress Notes (Signed)
 Blood transfusion completed without any reaction. Will continue to monitor.

## 2023-07-03 NOTE — Progress Notes (Signed)
  Progress Note    07/03/2023 7:38 AM 5 Days Post-Op  Subjective:  no complaints. Denies any pain    Vitals:   07/03/23 0500 07/03/23 0643  BP:    Pulse:    Resp: 19 18  Temp:    SpO2:      Physical Exam: General:  sitting in bed, NAD Cardiac:  regular Lungs:  nonlabored Incisions:  bilateral axillary incisions intact and dry with mild erythema, likely due to dermabond irritation. Left groin incision intact and dry Extremities:  right hip disarticulation site with wound vac with good seal. L foot well perfused with brisk PT doppler signal  CBC    Component Value Date/Time   WBC 14.0 (H) 07/03/2023 0500   RBC 3.05 (L) 07/03/2023 0500   HGB 8.8 (L) 07/03/2023 0500   HGB 14.2 03/23/2021 0928   HCT 26.6 (L) 07/03/2023 0500   HCT 42.1 03/23/2021 0928   PLT 314 07/03/2023 0500   PLT 135 (L) 03/23/2021 0928   MCV 87.2 07/03/2023 0500   MCV 93 03/23/2021 0928   MCH 28.9 07/03/2023 0500   MCHC 33.1 07/03/2023 0500   RDW 15.7 (H) 07/03/2023 0500   RDW 12.0 03/23/2021 0928   LYMPHSABS 2.7 06/30/2023 0419   LYMPHSABS 1.9 03/23/2021 0928   MONOABS 1.5 (H) 06/30/2023 0419   EOSABS 0.1 06/30/2023 0419   EOSABS 0.3 03/23/2021 0928   BASOSABS 0.0 06/30/2023 0419   BASOSABS 0.0 03/23/2021 0928    BMET    Component Value Date/Time   NA 131 (L) 07/01/2023 0829   NA 138 01/15/2020 1128   K 4.1 07/01/2023 0829   CL 96 (L) 07/01/2023 0829   CO2 29 07/01/2023 0829   GLUCOSE 119 (H) 07/01/2023 0829   BUN 6 (L) 07/01/2023 0829   BUN 8 01/15/2020 1128   CREATININE 0.49 07/01/2023 0829   CALCIUM  8.8 (L) 07/01/2023 0829   GFRNONAA >60 07/01/2023 0829   GFRAA 80 01/15/2020 1128    INR    Component Value Date/Time   INR 1.1 06/29/2023 0947     Intake/Output Summary (Last 24 hours) at 07/03/2023 0738 Last data filed at 07/03/2023 9372 Gross per 24 hour  Intake 594 ml  Output 2550 ml  Net -1956 ml      Assessment/Plan:  68 y.o. female is s/p excision of infected R  ax-bifemoral bypass with left axillary to CF bypass graft 6/18. S/p hip Hip disarticulation by Dr. Harden on 6/25    -She is doing fine this morning without any complaints -Bilateral axillary and left groin incisions intact and dry. Incisions with mild surrounding erythema likely due to dermabond irritation -Right hip disarticulation with wound vac. Appreciate Dr.Duda assistance -LLE warm and well perfused with good PT doppler signal -H&H stable   Ahmed Holster, PA-C [facility] [phone] 07/03/2023 7:38 AM

## 2023-07-03 NOTE — Progress Notes (Signed)
 Mobility Specialist Progress Note:    07/03/23 1445  Mobility  Activity Transferred from chair to bed  Level of Assistance Contact guard assist, steadying assist  Assistive Device Front wheel walker  Distance Ambulated (ft) 4 ft  RLE Weight Bearing Per Provider Order NWB  Activity Response Tolerated well  Mobility Referral Yes  Mobility visit 1 Mobility  Mobility Specialist Start Time (ACUTE ONLY) 1445  Mobility Specialist Stop Time (ACUTE ONLY) 1455  Mobility Specialist Time Calculation (min) (ACUTE ONLY) 10 min   Pt received in chair, requesting assistance back to bed. CGA +2 w RW to stand and pivot to bed. Tolerated well, asx throughout. Left pt in bed with all needs met. Call bell in reach, bed alarm on.   Massa Pe Mobility Specialist Please contact via Special educational needs teacher or  Rehab office at [phone]

## 2023-07-03 NOTE — Plan of Care (Signed)
  Problem: Health Behavior/Discharge Planning: Goal: Ability to manage health-related needs will improve Outcome: Progressing   Problem: Clinical Measurements: Goal: Will remain free from infection Outcome: Progressing Goal: Respiratory complications will improve Outcome: Progressing Goal: Cardiovascular complication will be avoided Outcome: Progressing   Problem: Activity: Goal: Risk for activity intolerance will decrease Outcome: Not Progressing   Problem: Pain Managment: Goal: General experience of comfort will improve and/or be controlled Outcome: Not Progressing   Problem: Skin Integrity: Goal: Risk for impaired skin integrity will decrease Outcome: Not Progressing

## 2023-07-03 NOTE — Progress Notes (Signed)
 Inpatient Rehab Admissions Coordinator:   Pt able to see OT over the weekend.  Once PT able to see today I will try and get insurance auth open.    Reche Lowers, PT, DPT Admissions Coordinator [phone] 07/03/23  10:09 AM

## 2023-07-03 NOTE — Consult Note (Signed)
 WOC Nurse Consult Note:  WOC team received consult for [facility] management, noted plan is for VAC removal and dry dressings per Dr Harden.  WOC team will not follow at this time.  Please re-consult if new needs arise.  Thank you,  Doyal Polite, RN, MSN, [facility] WOC Team

## 2023-07-03 NOTE — Progress Notes (Signed)
 Physical Therapy Treatment Patient Details Name: Katherine Archer MRN: [id number] DOB: Oct 01, 1955 Today's Date: 07/03/2023   History of Present Illness Pt is a 69 y/o F admitted from CIR to acute services on 06/16/23. 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 Pt underwent R groin I&D & R AKA revision on 06/16/23, removal of R axillofemoral & femorofemoral bypass on 06/21/23. Pt underwent R hip disarticulation on 06/28/23. PMH: HLD, HTN, PAD, stroke    PT Comments  Pt received in supine, agreeable to therapy session and with good participation and tolerance for transfer, gait and standing exercise instruction. Pt needing up to minA for short distance gait trial at bedside and reciprocal STS exercises/standing heel raises with pt tending to rely on bed/chair frame for stability behind single limb when standing. Pt agreeable to sit up in chair 1-2 hours post-exertion and daughter arriving at end of session and updated on her progress. Per daughter, pt wants +2 assist when nursing staff helping her to transfer, but OK with therapies helping her with +1 assist, RN/mobility specialist notified. Patient will benefit from intensive inpatient follow-up therapy, >3 hours/day, she denies significant fatigue post-exertion, but food arriving and agreeable to eat while daughter who just arrived is visiting.  Orthostatic Sitting  BP- Sitting (after SPT to chair) 116/54 (71)  SpO2 100% on RA with exertion, HR 70's bpm seated Orthostatic Sitting  BP- Sitting (~3 mins after sitting, asymptomatic) 109/46 (61)   Orthostatic Sitting  BP- Sitting (after gait trial, in chair, asymptomatic) 115/46 (64)     If plan is discharge home, recommend the following: A lot of help with walking and/or transfers;Assist for transportation;Help with stairs or ramp for entrance;Assistance with cooking/housework;A little help with  bathing/dressing/bathroom   Can travel by private Theme park manager cushion (measurements PT);Wheelchair (measurements PT);BSC/3in1    Recommendations for Other Services Rehab consult     Precautions / Restrictions Precautions Precautions: Fall Recall of Precautions/Restrictions: Intact Precaution/Restrictions Comments: Contact precs; wound vac, watch BP Restrictions Weight Bearing Restrictions Per Provider Order: Yes RLE Weight Bearing Per Provider Order: Non weight bearing     Mobility  Bed Mobility Overal bed mobility: Needs Assistance Bed Mobility: Supine to Sit     Supine to sit: Supervision, HOB elevated, Used rails     General bed mobility comments: increased time, min cues for line awareness and technique, pt utilizing bed features.    Transfers Overall transfer level: Needs assistance Equipment used: Rolling walker (2 wheels) Transfers: Sit to/from Stand, Bed to chair/wheelchair/BSC Sit to Stand: Min assist Stand pivot transfers: Contact guard assist         General transfer comment: EOB>chair with arm rests, pt using back of LLE for stability with sit>stand, cued for changing her body position to rely less on bed/chair frame as not all chairs have brakes, but pt with limited carryover of cues; variable CGA to light minA with reciprocal STS x 10 reps    Ambulation/Gait Ambulation/Gait assistance: Min assist Gait Distance (Feet): 8 Feet Assistive device: Rolling walker (2 wheels) Gait Pattern/deviations:  (hop-to) Gait velocity: decreased     General Gait Details: hop to gait pattern with RW and low foot clearance with Min A for balance with backward hopping and intermittent assist for improved RW/proximity to RW.   Stairs  Wheelchair Mobility     Tilt Bed    Modified Rankin (Stroke Patients Only)       Balance Overall balance assessment: Needs assistance Sitting-balance support: Feet  supported, Single extremity supported, No upper extremity supported Sitting balance-Leahy Scale: Fair Sitting balance - Comments: supervision static sitting   Standing balance support: Bilateral upper extremity supported, During functional activity, Reliant on assistive device for balance Standing balance-Leahy Scale: Poor Standing balance comment: requires BUE support for balance and Min A                            Communication Communication Communication: No apparent difficulties Factors Affecting Communication: Non - English speaking, interpreter not available (Pt's daughter interpretting over the phone)  Cognition Arousal: Alert Behavior During Therapy: WFL for tasks assessed/performed   PT - Cognitive impairments: No apparent impairments                       PT - Cognition Comments: Pt with good awareness of lines/leads, able to communicate her needs/response to positional changes. Following commands: Intact      Cueing Cueing Techniques: Verbal cues  Exercises Other Exercises Other Exercises: STS x 10 reps (with brief rest break after 5 reps, using BUE to push up to RW) Other Exercises: standing LLE AROM: heel raises x 10 reps w/cues for maintaining L knee extension    General Comments General comments (skin integrity, edema, etc.): no visible drainage outside of wound vac and no c/o dizziness, BP soft and entered into flowsheet Attempted to contact live Spanish interpreter however no staff available prior to session, PTA able to interpret (fluent in Spanish) and pt denies additional questions post-exertion, also when daughter arrived to her room pt agrees no further questions regarding session.      Pertinent Vitals/Pain Pain Assessment Pain Assessment: 0-10 Faces Pain Scale: Hurts little more Pain Location: R hip/surgical site Pain Descriptors / Indicators: Grimacing, Guarding, Discomfort Pain Intervention(s): Monitored during session,  Repositioned, Other (comment) (pt had pain meds ~9am)    Home Living                          Prior Function            PT Goals (current goals can now be found in the care plan section) Acute Rehab PT Goals Patient Stated Goal: get better, eventually go home PT Goal Formulation: With patient/family Time For Goal Achievement: 07/12/23 Progress towards PT goals: Progressing toward goals    Frequency    Min 4X/week      PT Plan      Co-evaluation              AM-PAC PT 6 Clicks Mobility   Outcome Measure  Help needed turning from your back to your side while in a flat bed without using bedrails?: A Little Help needed moving from lying on your back to sitting on the side of a flat bed without using bedrails?: A Little Help needed moving to and from a bed to a chair (including a wheelchair)?: A Little Help needed standing up from a chair using your arms (e.g., wheelchair or bedside chair)?: A Little Help needed to walk in hospital room?: A Lot (rec chair follow) Help needed climbing 3-5 steps with a railing? : Total 6 Click Score: 15    End of Session Equipment Utilized During Treatment: Gait belt  Activity Tolerance: Patient tolerated treatment well Patient left: in chair;with call bell/phone within reach;with chair alarm set;with family/visitor present (daughter arriving to her room) Nurse Communication: Mobility status;Precautions;Other (comment) (pt foley needs to be emptied, up in chair and daughter requests +2 nursing assist for safety vs mobility/therapies assist her back to bed as pt had episode of imbalance when nursing staff got her up on another date) PT Visit Diagnosis: Difficulty in walking, not elsewhere classified (R26.2);Other abnormalities of gait and mobility (R26.89);Muscle weakness (generalized) (M62.81) Pain - Right/Left: Right Pain - part of body: Hip     Time: 8694-8664 PT Time Calculation (min) (ACUTE ONLY): 30 min  Charges:     $Gait Training: 8-22 mins $Therapeutic Exercise: 8-22 mins PT General Charges $$ ACUTE PT VISIT: 1 Visit                     Chevelle Coulson P., PTA Acute Rehabilitation Services Secure Chat Preferred 9a-5:30pm Office: [phone]    Connell HERO [facility] 07/03/2023, 2:05 PM

## 2023-07-03 NOTE — Progress Notes (Signed)
 Wound vac dressing removed per MD order.  Non-adherent and dry gauze applied.

## 2023-07-03 NOTE — Progress Notes (Signed)
 Patient ID: Katherine Archer, female   DOB: Jul 27, 1955, 68 y.o.   MRN: [id number] Wound VAC with a poor seal and will have the wound VAC removed and start dry dressing changes as needed.

## 2023-07-03 NOTE — Progress Notes (Signed)
 [facility] Progress Note Patient: Katherine Archer FMW:[id number] DOB: 1955/03/27 DOA: 06/16/2023  DOS: the patient was seen and examined on 07/03/2023  Brief Hospital Course: PMH of HTN, PAD, HLD, CVA, presented with wound dehiscence. Vascular surgery as well as orthopedic surgery were consulted. 1/24 right femoral endarterectomy and above-knee popliteal bypass 5/9 right femoral thrombectomy, right below-knee popliteal bypass 5/26 ligation of the bypass and redo above and below knee popliteal bypass. 5/28 I&D of right groin wound.  With sartorius muscle flap.  As well as right AKA. 6/13 redo right AKA with I&D. 6/18 removal of the bypass graft and I&D of the AKA. 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 Was on IV vancomycin  now on oral Zyvox . ID consult appreciated. Vascular surgery as well as orthopedic surgery following. Wound VAC is not working adequately per family.  Discussed with Dr. Harden as well as RN.  No alarms heard from the wound VAC.  Difficult to achieve good seal given groin wound per discussion with Dr. Harden.  Plan was to discontinue wound VAC on discharge.  Patient close to medical readiness for discharge.  Will discontinue wound VAC.  PAD. Vascular surgery following. Extensive vascular surgery requiring multiple redo procedures as well as graft removal. Currently has a wound VAC.  Postop nausea. X-ray abdomen unremarkable.  Vaginal candidiasis. Diflucan  for 1 week.  Vaginal pessary. Falling  behind on exchange. Appreciate GYN consultation. Outpatient follow-up recommended.  HTN. Blood pressure stable. Continue home regimen.  Anemia secondary to acute blood loss. Monitor H&H. Transfuse for hemoglobin less than 7.  1 PRBC given on 6/26.  1 more PRBC in 6/29.  Patient had a fever after PRBC currently resolved.  Perhaps use Tylenol   next time as a premedication.  Severe protein calorie malnutrition. Dietitian following. Placing the patient at a high risk for poor outcome.  Hyponatremia. Clinically nonsignificant.  Leukocytosis. Likely stress reaction. Monitor.  DVT prophylaxis. Family had questions about patient's DVT prophylaxis and requested to switch to pills. I explained that given concern regards to anemia at present a pill DVT prophylaxis would not be safe due to long-acting nature of the medicine as well as proceed with a higher dose than heparin  5000 units twice daily. Family understood.  Currently DVT prophylaxis on hold due to anemia.  PICC line. Currently does not require central access.  Must require IV access therefore we will continue PICC line.  Discussed with IV team.   Subjective: No nausea no vomiting no fever no chills.  Reports some abdominal pain.  Oral intake adequate.  Physical Exam: General: in Mild distress, No Rash Cardiovascular: S1 and S2 Present, No Murmur Respiratory: Good respiratory effort, Bilateral Air entry present. No Crackles, No wheezes Abdomen: Bowel Sound present, mild diffuse tenderness Extremities: No edema, right AKA Neuro: Alert and oriented x3, no new focal deficit  Data Reviewed: I have Reviewed nursing notes, Vitals, and Lab results. Since last encounter, pertinent lab results CBC and BMP   . I have ordered test including CBC and BMP  . I have discussed pt's care plan and test results with Dr. Harden  .   Disposition: Status is: Inpatient Remains inpatient appropriate because: Awaiting placement.  Medically ready.  SCD's Start: 06/28/23 1155   Family Communication: Family at bedside Level of care: Progressive   Vitals:   07/03/23 0643 07/03/23 0741 07/03/23 1212 07/03/23 1622  BP:  (!) 117/44 ROLLEN)  115/46 (!) 119/44  Pulse:  73 65 82  Resp: 18 (!) 22 19 15   Temp:  98.3 F (36.8 C) 98 F (36.7 C) 98.4 F (36.9 C)  TempSrc:  Oral Oral Oral  SpO2:   100% 98% 98%  Weight: 57 kg     Height:         Author: Yetta Blanch, MD 07/03/2023 6:54 PM  Please look on [email or web address] to find out who is on call.

## 2023-07-03 NOTE — Progress Notes (Signed)
 Inpatient Rehab Admissions Coordinator:   I will begin insurance auth request for CIR today.   Reche Lowers, PT, DPT Admissions Coordinator [phone] 07/03/23  2:54 PM

## 2023-07-04 ENCOUNTER — Inpatient Hospital Stay (HOSPITAL_COMMUNITY)

## 2023-07-04 ENCOUNTER — Inpatient Hospital Stay (HOSPITAL_COMMUNITY)
Admission: AD | Admit: 2023-07-04 | Discharge: 2023-07-13 | DRG: 945 | Disposition: A | Discharge status: Home Health | Source: Intra-hospital | Attending: Physical Medicine and Rehabilitation | Admitting: Physical Medicine and Rehabilitation

## 2023-07-04 ENCOUNTER — Other Ambulatory Visit (HOSPITAL_COMMUNITY): Payer: Self-pay

## 2023-07-04 DIAGNOSIS — I10 Essential (primary) hypertension: Secondary | ICD-10-CM | POA: Diagnosis present

## 2023-07-04 DIAGNOSIS — Z8673 Personal history of transient ischemic attack (TIA), and cerebral infarction without residual deficits: Secondary | ICD-10-CM | POA: Diagnosis not present

## 2023-07-04 DIAGNOSIS — I70221 Atherosclerosis of native arteries of extremities with rest pain, right leg: Secondary | ICD-10-CM | POA: Diagnosis not present

## 2023-07-04 DIAGNOSIS — N819 Female genital prolapse, unspecified: Secondary | ICD-10-CM | POA: Diagnosis present

## 2023-07-04 DIAGNOSIS — Z4781 Encounter for orthopedic aftercare following surgical amputation: Secondary | ICD-10-CM

## 2023-07-04 DIAGNOSIS — T8149XA Infection following a procedure, other surgical site, initial encounter: Secondary | ICD-10-CM

## 2023-07-04 DIAGNOSIS — R0902 Hypoxemia: Secondary | ICD-10-CM | POA: Diagnosis present

## 2023-07-04 DIAGNOSIS — Y835 Amputation of limb(s) as the cause of abnormal reaction of the patient, or of later complication, without mention of misadventure at the time of the procedure: Secondary | ICD-10-CM | POA: Diagnosis present

## 2023-07-04 DIAGNOSIS — Z89611 Acquired absence of right leg above knee: Secondary | ICD-10-CM | POA: Diagnosis not present

## 2023-07-04 DIAGNOSIS — K5900 Constipation, unspecified: Secondary | ICD-10-CM | POA: Diagnosis not present

## 2023-07-04 DIAGNOSIS — Z87891 Personal history of nicotine dependence: Secondary | ICD-10-CM

## 2023-07-04 DIAGNOSIS — E871 Hypo-osmolality and hyponatremia: Secondary | ICD-10-CM | POA: Diagnosis not present

## 2023-07-04 DIAGNOSIS — R339 Retention of urine, unspecified: Secondary | ICD-10-CM | POA: Diagnosis present

## 2023-07-04 DIAGNOSIS — I959 Hypotension, unspecified: Secondary | ICD-10-CM | POA: Diagnosis not present

## 2023-07-04 DIAGNOSIS — Z7982 Long term (current) use of aspirin: Secondary | ICD-10-CM | POA: Diagnosis not present

## 2023-07-04 DIAGNOSIS — T8579XD Infection and inflammatory reaction due to other internal prosthetic devices, implants and grafts, subsequent encounter: Principal | ICD-10-CM

## 2023-07-04 DIAGNOSIS — Z79899 Other long term (current) drug therapy: Secondary | ICD-10-CM | POA: Diagnosis not present

## 2023-07-04 DIAGNOSIS — Z6821 Body mass index (BMI) 21.0-21.9, adult: Secondary | ICD-10-CM

## 2023-07-04 DIAGNOSIS — Z7983 Long term (current) use of bisphosphonates: Secondary | ICD-10-CM | POA: Diagnosis not present

## 2023-07-04 DIAGNOSIS — B9562 Methicillin resistant Staphylococcus aureus infection as the cause of diseases classified elsewhere: Secondary | ICD-10-CM | POA: Diagnosis present

## 2023-07-04 DIAGNOSIS — E43 Unspecified severe protein-calorie malnutrition: Secondary | ICD-10-CM | POA: Diagnosis present

## 2023-07-04 DIAGNOSIS — Z880 Allergy status to penicillin: Secondary | ICD-10-CM

## 2023-07-04 DIAGNOSIS — R7401 Elevation of levels of liver transaminase levels: Secondary | ICD-10-CM | POA: Diagnosis not present

## 2023-07-04 DIAGNOSIS — N8189 Other female genital prolapse: Secondary | ICD-10-CM | POA: Diagnosis not present

## 2023-07-04 DIAGNOSIS — R7881 Bacteremia: Secondary | ICD-10-CM | POA: Diagnosis not present

## 2023-07-04 DIAGNOSIS — I739 Peripheral vascular disease, unspecified: Secondary | ICD-10-CM | POA: Diagnosis not present

## 2023-07-04 DIAGNOSIS — E785 Hyperlipidemia, unspecified: Secondary | ICD-10-CM | POA: Diagnosis present

## 2023-07-04 DIAGNOSIS — D62 Acute posthemorrhagic anemia: Secondary | ICD-10-CM | POA: Diagnosis present

## 2023-07-04 DIAGNOSIS — R040 Epistaxis: Secondary | ICD-10-CM | POA: Diagnosis not present

## 2023-07-04 DIAGNOSIS — Z89621 Acquired absence of right hip joint: Secondary | ICD-10-CM

## 2023-07-04 DIAGNOSIS — E878 Other disorders of electrolyte and fluid balance, not elsewhere classified: Secondary | ICD-10-CM | POA: Diagnosis present

## 2023-07-04 DIAGNOSIS — A419 Sepsis, unspecified organism: Secondary | ICD-10-CM

## 2023-07-04 DIAGNOSIS — N898 Other specified noninflammatory disorders of vagina: Secondary | ICD-10-CM | POA: Diagnosis present

## 2023-07-04 DIAGNOSIS — Z881 Allergy status to other antibiotic agents status: Secondary | ICD-10-CM

## 2023-07-04 DIAGNOSIS — T8781 Dehiscence of amputation stump: Secondary | ICD-10-CM | POA: Diagnosis present

## 2023-07-04 HISTORY — DX: Acquired absence of right hip joint: Z89.621

## 2023-07-04 LAB — CBC
HCT: 27.7 % — ABNORMAL LOW (ref 36.0–46.0)
Hemoglobin: 9 g/dL — ABNORMAL LOW (ref 12.0–15.0)
MCH: 28.7 pg (ref 26.0–34.0)
MCHC: 32.5 g/dL (ref 30.0–36.0)
MCV: 88.2 fL (ref 80.0–100.0)
Platelets: 325 10*3/uL (ref 150–400)
RBC: 3.14 MIL/uL — ABNORMAL LOW (ref 3.87–5.11)
RDW: 15.4 % (ref 11.5–15.5)
WBC: 13.1 10*3/uL — ABNORMAL HIGH (ref 4.0–10.5)
nRBC: 0 % (ref 0.0–0.2)

## 2023-07-04 MED ORDER — MELATONIN 5 MG PO TABS: 5.0000 mg | ORAL_TABLET | Freq: Every evening | ORAL | Status: DC | PRN

## 2023-07-04 MED ORDER — PROCHLORPERAZINE 25 MG RE SUPP: 12.5000 mg | Freq: Four times a day (QID) | RECTAL | Status: DC | PRN

## 2023-07-04 MED ORDER — PANTOPRAZOLE SODIUM 40 MG PO TBEC
40.0000 mg | DELAYED_RELEASE_TABLET | Freq: Every day | ORAL | 0 refills | Status: DC
  Filled 2023-07-04: qty 30, 30d supply, fill #0

## 2023-07-04 MED ORDER — ACETAMINOPHEN 325 MG PO TABS: 325.0000 mg | ORAL_TABLET | ORAL | Status: DC | PRN

## 2023-07-04 MED ORDER — SENNOSIDES-DOCUSATE SODIUM 8.6-50 MG PO TABS
1.0000 | ORAL_TABLET | Freq: Two times a day (BID) | ORAL | Status: DC
Start: 2023-07-04 — End: 2023-07-10

## 2023-07-04 MED ORDER — CHLORHEXIDINE GLUCONATE CLOTH 2 % EX PADS: 6.0000 | MEDICATED_PAD | Freq: Every day | CUTANEOUS | Status: DC

## 2023-07-04 MED ORDER — JUVEN PO PACK
1.0000 | PACK | Freq: Two times a day (BID) | ORAL | Status: DC
  Administered 2023-07-05 – 2023-07-13 (×16): 1 via ORAL
  Filled 2023-07-04 (×16): qty 1

## 2023-07-04 MED ORDER — OXYCODONE HCL ER 10 MG PO T12A
10.0000 mg | EXTENDED_RELEASE_TABLET | Freq: Two times a day (BID) | ORAL | Status: DC
  Administered 2023-07-04 – 2023-07-10 (×12): 10 mg via ORAL
  Filled 2023-07-04 (×12): qty 1

## 2023-07-04 MED ORDER — SENNOSIDES-DOCUSATE SODIUM 8.6-50 MG PO TABS
2.0000 | ORAL_TABLET | Freq: Every day | ORAL | Status: DC
  Administered 2023-07-05 – 2023-07-12 (×8): 2 via ORAL
  Filled 2023-07-04 (×8): qty 2

## 2023-07-04 MED ORDER — PROCHLORPERAZINE EDISYLATE 10 MG/2ML IJ SOLN: 5.0000 mg | Freq: Four times a day (QID) | INTRAMUSCULAR | Status: DC | PRN

## 2023-07-04 MED ORDER — PHENOL 1.4 % MT LIQD: 1.0000 | OROMUCOSAL | Status: DC | PRN

## 2023-07-04 MED ORDER — PANTOPRAZOLE SODIUM 40 MG PO TBEC
40.0000 mg | DELAYED_RELEASE_TABLET | Freq: Every day | ORAL | Status: DC
  Administered 2023-07-05 – 2023-07-13 (×9): 40 mg via ORAL
  Filled 2023-07-04 (×9): qty 1

## 2023-07-04 MED ORDER — FE FUM-VIT C-VIT B12-FA 460-60-0.01-1 MG PO CAPS
1.0000 | ORAL_CAPSULE | Freq: Every day | ORAL | Status: DC
  Administered 2023-07-05 – 2023-07-12 (×8): 1 via ORAL
  Filled 2023-07-04 (×7): qty 1

## 2023-07-04 MED ORDER — BISACODYL 10 MG RE SUPP
10.0000 mg | Freq: Every day | RECTAL | Status: DC | PRN
  Administered 2023-07-05: 10 mg via RECTAL
  Filled 2023-07-04: qty 1

## 2023-07-04 MED ORDER — METOPROLOL SUCCINATE ER 25 MG PO TB24
12.5000 mg | ORAL_TABLET | Freq: Every day | ORAL | 0 refills | Status: DC
  Filled 2023-07-04: qty 15, 30d supply, fill #0

## 2023-07-04 MED ORDER — PROCHLORPERAZINE MALEATE 5 MG PO TABS: 5.0000 mg | ORAL_TABLET | Freq: Four times a day (QID) | ORAL | Status: DC | PRN

## 2023-07-04 MED ORDER — ADULT MULTIVITAMIN W/MINERALS CH
1.0000 | ORAL_TABLET | Freq: Every day | ORAL | Status: DC
  Administered 2023-07-05 – 2023-07-12 (×8): 1 via ORAL
  Filled 2023-07-04 (×8): qty 1

## 2023-07-04 MED ORDER — ALUM & MAG HYDROXIDE-SIMETH 200-200-20 MG/5ML PO SUSP
30.0000 mL | ORAL | Status: DC | PRN
Start: 2023-07-04 — End: 2023-07-13

## 2023-07-04 MED ORDER — OXYCODONE HCL 5 MG PO TABS
10.0000 mg | ORAL_TABLET | Freq: Four times a day (QID) | ORAL | Status: DC | PRN
  Administered 2023-07-04 – 2023-07-06 (×4): 10 mg via ORAL
  Filled 2023-07-04 (×4): qty 2

## 2023-07-04 MED ORDER — OXYCODONE HCL 5 MG PO TABS
5.0000 mg | ORAL_TABLET | Freq: Four times a day (QID) | ORAL | Status: DC | PRN
  Administered 2023-07-07: 5 mg via ORAL
  Filled 2023-07-04: qty 1

## 2023-07-04 MED ORDER — ENSURE PLUS HIGH PROTEIN PO LIQD
237.0000 mL | Freq: Two times a day (BID) | ORAL | Status: DC
  Administered 2023-07-05 – 2023-07-13 (×11): 237 mL via ORAL

## 2023-07-04 MED ORDER — ASPIRIN 81 MG PO TBEC: 81.0000 mg | DELAYED_RELEASE_TABLET | Freq: Every day | ORAL | Status: DC

## 2023-07-04 MED ORDER — DIPHENHYDRAMINE HCL 25 MG PO CAPS: 25.0000 mg | ORAL_CAPSULE | Freq: Four times a day (QID) | ORAL | Status: DC | PRN

## 2023-07-04 MED ORDER — ENOXAPARIN SODIUM 40 MG/0.4ML IJ SOSY
40.0000 mg | PREFILLED_SYRINGE | INTRAMUSCULAR | Status: DC
  Administered 2023-07-04 – 2023-07-12 (×9): 40 mg via SUBCUTANEOUS
  Filled 2023-07-04 (×9): qty 0.4

## 2023-07-04 MED ORDER — POLYETHYLENE GLYCOL 3350 17 G PO PACK
17.0000 g | PACK | Freq: Every day | ORAL | Status: DC
  Administered 2023-07-05 – 2023-07-13 (×9): 17 g via ORAL
  Filled 2023-07-04 (×9): qty 1

## 2023-07-04 MED ORDER — BENEPROTEIN PO POWD
1.0000 | Freq: Three times a day (TID) | ORAL | Status: DC
  Administered 2023-07-04 – 2023-07-12 (×16): 6 g via ORAL
  Filled 2023-07-04: qty 227

## 2023-07-04 MED ORDER — AMLODIPINE BESYLATE 10 MG PO TABS
10.0000 mg | ORAL_TABLET | Freq: Every day | ORAL | Status: DC
  Administered 2023-07-06 – 2023-07-09 (×4): 10 mg via ORAL
  Filled 2023-07-04 (×6): qty 1

## 2023-07-04 MED ORDER — FE FUM-VIT C-VIT B12-FA 460-60-0.01-1 MG PO CAPS
1.0000 | ORAL_CAPSULE | Freq: Every day | ORAL | 0 refills | Status: DC
Start: 2023-07-04 — End: 2023-07-10
  Filled 2023-07-04: qty 30, 30d supply, fill #0

## 2023-07-04 MED ORDER — VITAMIN C 500 MG PO TABS
250.0000 mg | ORAL_TABLET | Freq: Two times a day (BID) | ORAL | Status: DC
  Administered 2023-07-04 – 2023-07-13 (×18): 250 mg via ORAL
  Filled 2023-07-04 (×18): qty 1

## 2023-07-04 MED ORDER — LINEZOLID 600 MG PO TABS
600.0000 mg | ORAL_TABLET | Freq: Two times a day (BID) | ORAL | Status: DC
Start: 2023-07-04 — End: 2023-07-12

## 2023-07-04 MED ORDER — ENSURE PLUS HIGH PROTEIN PO LIQD
237.0000 mL | Freq: Two times a day (BID) | ORAL | 0 refills | Status: DC
  Filled 2023-07-04: qty 10000, 21d supply, fill #0

## 2023-07-04 MED ORDER — GUAIFENESIN-DM 100-10 MG/5ML PO SYRP: 5.0000 mL | ORAL_SOLUTION | Freq: Four times a day (QID) | ORAL | Status: DC | PRN

## 2023-07-04 MED ORDER — GABAPENTIN 300 MG PO CAPS
300.0000 mg | ORAL_CAPSULE | Freq: Three times a day (TID) | ORAL | Status: DC
  Administered 2023-07-04 – 2023-07-07 (×8): 300 mg via ORAL
  Filled 2023-07-04 (×8): qty 1

## 2023-07-04 MED ORDER — FLEET ENEMA RE ENEM: 1.0000 | ENEMA | Freq: Once | RECTAL | Status: DC | PRN

## 2023-07-04 MED ORDER — HYDRALAZINE HCL 25 MG PO TABS
25.0000 mg | ORAL_TABLET | Freq: Three times a day (TID) | ORAL | Status: DC
  Administered 2023-07-04 – 2023-07-13 (×24): 25 mg via ORAL
  Filled 2023-07-04 (×28): qty 1

## 2023-07-04 MED ORDER — LINEZOLID 600 MG PO TABS
600.0000 mg | ORAL_TABLET | Freq: Two times a day (BID) | ORAL | Status: DC
  Administered 2023-07-04 – 2023-07-12 (×16): 600 mg via ORAL
  Filled 2023-07-04 (×16): qty 1

## 2023-07-04 MED ORDER — METOPROLOL SUCCINATE ER 25 MG PO TB24
12.5000 mg | ORAL_TABLET | Freq: Every day | ORAL | Status: DC
  Administered 2023-07-05 – 2023-07-13 (×9): 12.5 mg via ORAL
  Filled 2023-07-04 (×9): qty 1

## 2023-07-04 MED ORDER — CHLORHEXIDINE GLUCONATE CLOTH 2 % EX PADS
6.0000 | MEDICATED_PAD | Freq: Two times a day (BID) | CUTANEOUS | Status: DC
  Administered 2023-07-05 – 2023-07-13 (×17): 6 via TOPICAL

## 2023-07-04 MED ORDER — ADULT MULTIVITAMIN W/MINERALS CH
1.0000 | ORAL_TABLET | Freq: Every day | ORAL | 0 refills | Status: AC
  Filled 2023-07-04: qty 30, 30d supply, fill #0

## 2023-07-04 MED ORDER — PROSOURCE PLUS PO LIQD
30.0000 mL | Freq: Two times a day (BID) | ORAL | Status: DC
  Administered 2023-07-05 – 2023-07-13 (×16): 30 mL via ORAL
  Filled 2023-07-04 (×16): qty 30

## 2023-07-04 NOTE — Progress Notes (Signed)
 Cornelio Bouchard, MD  Physician Physical Medicine and Rehabilitation   PMR Pre-admission     Signed   Date of Service: 06/29/2023 12:19 PM  Related encounter: Admission (Current) from 06/16/2023 in [facility] 4E CV SURGICAL PROGRESSIVE CARE   Signed     Expand All Collapse All  Show:Clear all [x] Written[x] Templated[x] Copied  Added by: [x] Alison Heron MATSU, RN  [] Hover for details PMR Admission Coordinator Pre-Admission Assessment   Patient: Katherine Archer is an 68 y.o., female MRN: [id number] DOB: 12/23/1955 Height: (P) 5' 6 (167.6 cm) Weight: 57 kg                                                                                      Insurance Information HMO:     PPO: yes     PCP:      IPA:      80/20:      OTHER:  PRIMARY: [employer] Medicare HMO/PPO      Policy#:[id number]     Subscriber: pt CM Name: Tiffany      Phone#: [phone]     Fax#: [phone] Pre-Cert#: [id number] approved 7/1/ until 7/8      Employer:  Benefits:  Phone #: [phone]     Name: 6/30 Eff. Date: 01/04/23     Deduct: none      Out of Pocket Max: $4150      Life Max: none  CIR: $300 co pay per day days 1 until 6      SNF: $10 co pay per day days 1 until 20; $214 co pay per day days 21 until 100 Outpatient: $10 per visit     Co-Pay:  Home Health: 100%      Co-Pay:  DME: 80%     Co-Pay: 20% Providers: in network  SECONDARY: Medicaid of [place]      Policy#: [id number]         Financial Counselor:       Phone#:    The "Data Collection Information Summary" for patients in Inpatient Rehabilitation Facilities with attached "Privacy Act Statement-Health Care Records" was provided and verbally reviewed with: Family   Emergency Contact Information Contact Information       Name Relation Home Work Mobile    Brooten Sister     [phone]    Revera,Melody Granddaughter     [phone]    Revera,Annie Daughter [phone]         Other Contacts       Name Relation Home Work  Mobile    Henderson,Luz Daughter     [phone]         Current Medical History  Patient Admitting Diagnosis: hip disarticulation   History of Present Illness: Katherine Archer is a 68 year old female with history of HTN, stroke, pelvic prolapse (tx w/pessary) PAD with multiple procedures-->right axillary bifemoral BG 11/21, critical limb ischemia s/p thrombectomy with 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 Serene with ID recommending IV antibiotics followed by lifelong suppression with doxycycline.  She was admitted for intensive rehab program on 06/07/23 with wound VAC to right groin.    She continued to have issues with pain control, was found to have copious drainage with necrosis of medial aspect of wound as well as ischemia with necrosis of R-AKA incision. She was discharged to acute hospital on 06/16/23 for revision of AKA and I & D of groin wound but continued to have intermittent fevers up to 102 despite IV Vanc as well as issues with pain. Palliative care consulted and patient planned to continue treatment and surgical intervention as needed. She underwent removal of right axillofemoral and femorofemoral bypass on 06/18.  Dr. 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  Dr. 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     Dr. Fredirick consulted for input due on pessary as well as ongoing issues with white discharge which has been  treated with diflucan .   She reported that patient with pessaries frequently have increased vaginal discharge can be malodorous and no signs of infection noted. Pessary cleaned and changed out 06/30/23 with decrease in drainage and marked edema/swelling of right labial majora and vaginal canal felt to be related to prior surgery.     Patient's medical record from [facility] [place] has been reviewed by the rehabilitation admission coordinator and physician.   Past Medical History      Past Medical History:  Diagnosis Date   Cervical polyp 09/18/2018   Critical lower limb ischemia (HCC)     Encounter for screening mammogram for breast cancer 05/05/2016   Fibrocystic breast changes of both breasts 04/29/2015   Hyperlipidemia     Hypertension     PAD (peripheral artery disease) (HCC) 08/23/2019   Pelvic prolapse 06/2020    Pelvic ring in place, needs to be changed every 3 months   Stroke [facility]) 08/23/2019        Has the patient had major surgery during 100 days prior to admission? Yes   Family History  family history includes Heart Problems in her brother, brother, and father.   Current Medications   Current Medications    Current Facility-Administered Medications:    0.9 %  sodium chloride  infusion (Manually program via Guardrails IV Fluids), , Intravenous, Once, Tobie Yetta HERO, MD   acetaminophen  (TYLENOL ) tablet 650 mg, 650 mg, Oral, Q6H PRN, Patel, Pranav M, MD, 650 mg at 07/03/23 0902   alum & mag hydroxide-simeth (MAALOX/MYLANTA) 200-200-20 MG/5ML suspension 15 mL, 15 mL, Oral, Q2H PRN, Patel, Pranav M, MD   amLODipine  (NORVASC ) tablet 10 mg, 10 mg, Oral, Daily, Collins, Emma M, PA-C, 10 mg at 07/04/23 1058   ascorbic acid  (VITAMIN C ) tablet 250 mg, 250 mg, Oral, BID, Gerome Herring M, PA-C, 250 mg at 07/04/23 1059   Chlorhexidine  Gluconate Cloth 2 % PADS 6 each, 6 each, Topical, Daily, Gerome Herring HERO, PA-C, 6 each at 07/03/23 0905   Fe Fum-Vit C-Vit B12-FA (TRIGELS-F FORTE)  capsule 1 capsule, 1 capsule, Oral, Daily, Patel, Pranav M, MD, 1 capsule at 07/04/23 1111   feeding supplement (ENSURE PLUS HIGH PROTEIN) liquid 237 mL, 237 mL, Oral, BID BM, Gerome Herring HERO, PA-C,  237 mL at 07/03/23 9093   gabapentin  (NEURONTIN ) capsule 300 mg, 300 mg, Oral, BID, Gerome Herring M, PA-C, 300 mg at 07/04/23 1059   guaiFENesin -dextromethorphan (ROBITUSSIN DM) 100-10 MG/5ML syrup 15 mL, 15 mL, Oral, Q4H PRN, Gerome, Emma M, PA-C   hydrALAZINE  (APRESOLINE ) injection 10 mg, 10 mg, Intravenous, Q4H PRN, Patel, Pranav M, MD   hydrALAZINE  (APRESOLINE ) tablet 25 mg, 25 mg, Oral, Q8H, Collins, Emma M, PA-C, 25 mg at 07/04/23 9386   HYDROmorphone  (DILAUDID ) injection 0.5 mg, 0.5 mg, Intravenous, Q3H PRN, Patel, Pranav M, MD, 0.5 mg at 07/03/23 2335   linezolid  (ZYVOX ) tablet 600 mg, 600 mg, Oral, Q12H, Manandhar, Sabina, MD, 600 mg at 07/04/23 1111   metoprolol  succinate (TOPROL -XL) 24 hr tablet 12.5 mg, 12.5 mg, Oral, Daily, Collins, Emma M, PA-C, 12.5 mg at 07/04/23 1100   multivitamin with minerals tablet 1 tablet, 1 tablet, Oral, Daily, Gerome Herring M, PA-C, 1 tablet at 07/04/23 1058   ondansetron  (ZOFRAN ) injection 4 mg, 4 mg, Intravenous, Q6H PRN, Parker, Alicia C, NP   oxyCODONE  (Oxy IR/ROXICODONE ) immediate release tablet 5 mg, 5 mg, Oral, Q6H PRN, 5 mg at 07/02/23 0600 **OR** oxyCODONE  (Oxy IR/ROXICODONE ) immediate release tablet 10 mg, 10 mg, Oral, Q6H PRN, Patel, Pranav M, MD, 10 mg at 07/04/23 9387   oxyCODONE  (OXYCONTIN ) 12 hr tablet 10 mg, 10 mg, Oral, QHS, Collins, Emma M, PA-C, 10 mg at 07/03/23 2130   pantoprazole  (PROTONIX ) EC tablet 40 mg, 40 mg, Oral, Daily, Gerome Herring M, PA-C, 40 mg at 07/04/23 1058   phenol (CHLORASEPTIC) mouth spray 1 spray, 1 spray, Mouth/Throat, PRN, Gerome, Emma M, PA-C   polyethylene glycol (MIRALAX  / GLYCOLAX ) packet 17 g, 17 g, Oral, Daily, Collins, Emma M, PA-C, 17 g at 07/04/23 1108   senna-docusate (Senokot-S) tablet 1 tablet, 1 tablet,  Oral, BID, Collins, Emma M, PA-C, 1 tablet at 07/04/23 1058   sodium chloride  flush (NS) 0.9 % injection 3 mL, 3 mL, Intravenous, Q12H, Collins, Emma M, PA-C, 3 mL at 07/03/23 2132     Patients Current Diet:  Diet Order                  Diet general             Diet regular Room service appropriate? Yes with Assist; Fluid consistency: Thin  Diet effective now                       Precautions / Restrictions Precautions Precautions: Fall Precaution/Restrictions Comments: Contact precs; wound vac, watch BP Restrictions Weight Bearing Restrictions Per Provider Order: Yes RLE Weight Bearing Per Provider Order: Non weight bearing    Has the patient had 2 or more falls or a fall with injury in the past year?No   Prior Activity Level Community (5-7x/wk): pta got out of the house daily   Prior Functional Level Prior Function Prior Level of Function : Independent/Modified Independent Mobility Comments: Usually alternates between using SPC and RW ADLs Comments: Pt typically Independent with ADLs; prn for ADLs   Self Care: Did the patient need help bathing, dressing, using the toilet or eating?  Needed some help   Indoor Mobility: Did the patient need assistance with walking from room to room (with or without device)? Needed some help   Stairs: Did the patient need assistance with internal or external stairs (with or without device)? Needed some help   Functional Cognition: Did the patient need help planning regular tasks  such as shopping or remembering to take medications? Needed some help   Patient Information Are you of Hispanic, Latino/a,or Spanish origin?: E. Yes, another Hispanic, Latino, or Spanish origin What is your race?: N. Other Pacific Islander Do you need or want an interpreter to communicate with a doctor or health care staff?: 1. Yes   Patient's Response To:  Health Literacy and Transportation Is the patient able to respond to health literacy and  transportation needs?: Yes Health Literacy - How often do you need to have someone help you when you read instructions, pamphlets, or other written material from your doctor or pharmacy?: Never In the past 12 months, has lack of transportation kept you from medical appointments or from getting medications?: No In the past 12 months, has lack of transportation kept you from meetings, work, or from getting things needed for daily living?: No   Home Assistive Devices / Equipment Home Equipment: Cane - single point, Agricultural consultant (2 wheels)   Prior Device Use: Indicate devices/aids used by the patient prior to current illness, exacerbation or injury? Manual wheelchair and Walker   Current Functional Level Cognition   Orientation Level: Oriented X4    Extremity Assessment (includes Sensation/Coordination)   Upper Extremity Assessment: Generalized weakness  Lower Extremity Assessment: Defer to PT evaluation RLE Deficits / Details: s/p R hip disarticulation     ADLs   Overall ADL's : Needs assistance/impaired Eating/Feeding: Set up, Sitting Grooming: Wash/dry face, Oral care, Set up, Sitting Upper Body Bathing: Set up, Sitting Lower Body Bathing: Contact guard assist, Sit to/from stand Upper Body Dressing : Set up, Sitting Lower Body Dressing: Contact guard assist, Sit to/from stand Lower Body Dressing Details (indicate cue type and reason): pt able to reach LLE to thread pants, in standing fair balance for task Toilet Transfer: Contact guard assist, Stand-pivot, Rolling walker (2 wheels) Toilet Transfer Details (indicate cue type and reason): Simulated in room Toileting- Clothing Manipulation and Hygiene: Sitting/lateral lean, Contact guard assist Toileting - Clothing Manipulation Details (indicate cue type and reason): CGA for balance Functional mobility during ADLs: Contact guard assist, Rolling walker (2 wheels) General ADL Comments: Pt with adequate standing balance to progress ADLs      Mobility   Overal bed mobility: Needs Assistance Bed Mobility: Supine to Sit Supine to sit: Supervision, HOB elevated, Used rails Sit to supine: Min assist General bed mobility comments: increased time, min cues for line awareness and technique, pt utilizing bed features.     Transfers   Overall transfer level: Needs assistance Equipment used: Rolling walker (2 wheels) Transfers: Sit to/from Stand, Bed to chair/wheelchair/BSC Sit to Stand: Min assist Bed to/from chair/wheelchair/BSC transfer type:: Stand pivot Stand pivot transfers: Contact guard assist Squat pivot transfers: Contact guard assist Step pivot transfers: Min assist General transfer comment: EOB>chair with arm rests, pt using back of LLE for stability with sit>stand, cued for changing her body position to rely less on bed/chair frame as not all chairs have brakes, but pt with limited carryover of cues; variable CGA to light minA with reciprocal STS x 10 reps     Ambulation / Gait / Stairs / Wheelchair Mobility   Ambulation/Gait Ambulation/Gait assistance: Editor, commissioning (Feet): 8 Feet Assistive device: Rolling walker (2 wheels) Gait Pattern/deviations:  (hop-to) General Gait Details: hop to gait pattern with RW and low foot clearance with Min A for balance with backward hopping and intermittent assist for improved RW/proximity to RW. Gait velocity: decreased Gait velocity interpretation: <1.31 ft/sec,  indicative of household ambulator     Posture / Balance Dynamic Sitting Balance Sitting balance - Comments: supervision static sitting Balance Overall balance assessment: Needs assistance Sitting-balance support: Feet supported, Single extremity supported, No upper extremity supported Sitting balance-Leahy Scale: Fair Sitting balance - Comments: supervision static sitting Standing balance support: Bilateral upper extremity supported, During functional activity, Reliant on assistive device for  balance Standing balance-Leahy Scale: Poor Standing balance comment: requires BUE support for balance and Min A     Special needs/care consideration Needs spanish interpreter        Previous Home Environment  Living Arrangements: Children, Other relatives  Lives With: Family Available Help at Discharge: Family, Available 24 hours/day Type of Home: Mobile home Home Layout: One level Home Access: Ramped entrance, Stairs to enter Entrance Stairs-Rails: Right, Left, Can reach both Entrance Stairs-Number of Steps: 5 Bathroom Shower/Tub: Sponge bathes at baseline Bathroom Toilet: Standard Bathroom Accessibility: Yes How Accessible: Accessible via walker Home Care Services:  ([facility] had been arrnaged by CIR) Additional Comments: Pt admitted from CIR, progressing towards Mod I at w/c level   Discharge Living Setting Type of Home at Discharge: Mobile home Discharge Home Layout: One level Discharge Home Access: Ramped entrance Discharge Bathroom Shower/Tub: Tub/shower unit Discharge Bathroom Toilet: Standard Discharge Bathroom Accessibility: Yes How Accessible: Accessible via walker Does the patient have any problems obtaining your medications?: No   Social/Family/Support Systems Contact Information: Melody, grand daughter and daughter, [name] Anticipated Caregiver: Melody and Architect Information: see contacts Ability/Limitations of Caregiver: Melody can provide assist Caregiver Availability: 24/7 Discharge Plan Discussed with Primary Caregiver: Yes Is Caregiver In Agreement with Plan?: Yes Does Caregiver/Family have Issues with Lodging/Transportation while Pt is in Rehab?: No   Goals Patient/Family Goal for Rehab: supervision with PT and OT Expected length of stay: ELOS 7 to 10 days Additional Information: recent CIR admit 6/4 until 06/16/23 and then admitted to acute for surgical plan Pt/Family Agrees to Admission and willing to participate:  Yes Program Orientation Provided & Reviewed with Pt/Caregiver Including Roles  & Responsibilities: Yes   Decrease burden of Care through IP rehab admission: n/a   Possible need for SNF placement upon discharge:not anticipated   Patient Condition: This patient's condition remains as documented in the consult dated 06/29/23, in which the Rehabilitation Physician determined and documented that the patient's condition is appropriate for intensive rehabilitative care in an inpatient rehabilitation facility. Will admit to inpatient rehab today.   Preadmission Screen Completed By:  Alison Heron Lot, RN MSN, 07/04/2023 11:18 AM ______________________________________________________________________   Discussed status with Dr. Lovorn on 07/04/23 at 1117 and received approval for admission today.   Admission Coordinator:  Alison Heron Lot, RN MSN time [id number] Date 07/04/23            Revision History

## 2023-07-04 NOTE — Plan of Care (Signed)
  Problem: Clinical Measurements: Goal: Respiratory complications will improve Outcome: Progressing   Problem: Activity: Goal: Risk for activity intolerance will decrease Outcome: Not Progressing   Problem: Pain Managment: Goal: General experience of comfort will improve and/or be controlled Outcome: Not Progressing   Problem: Skin Integrity: Goal: Risk for impaired skin integrity will decrease Outcome: Not Progressing

## 2023-07-04 NOTE — Progress Notes (Signed)
 Mobility Specialist Progress Note:    07/04/23 0914  Mobility  Activity Transferred from bed to chair  Level of Assistance Contact guard assist, steadying assist  Assistive Device Front wheel walker  Distance Ambulated (ft) 5 ft  RLE Weight Bearing Per Provider Order NWB  Activity Response Tolerated well  Mobility Referral Yes  Mobility visit 1 Mobility  Mobility Specialist Start Time (ACUTE ONLY) [id number]  Mobility Specialist Stop Time (ACUTE ONLY) 0912  Mobility Specialist Time Calculation (min) (ACUTE ONLY) 7 min   Pt received in bed, agreeable to mobility session. Transferred B>C via RW and CGA. Tolerated well, asx throughout. Left pt in chair with alarm on, call bell in reach, all needs met.   Lodie Waheed Mobility Specialist Please contact via Special educational needs teacher or  Rehab office at [phone]

## 2023-07-04 NOTE — H&P (Signed)
 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 Serene with ID recommending IV antibiotics followed by lifelong suppression with doxycycline.  She was admitted for intensive rehab program on 06/07/23 with wound VAC to right groin.   She continued to have issues with pain control was found to have copious drainage with necrosis of medial aspect of wound as well as ischemia with necrosis of R-AKA incision. She was discharged to acute hospital on 06/16/23 for revision of AKA and I & D of groin wound but continued to have intermittent fevers up to 102 despite IV Vanc as well as issues with pain. Palliative care consulted and patient planned to continue treatment and surgical intervention as needed. She underwent removal of right axillofemoral and femorofemoral bypass on 06/18.  Dr. 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  Dr. 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   Dr. Fredirick consulted for input due on pessary as well as ongoing issues with white discharge which  has been treated with diflucan .   She reported that patient with pessaries frequently have increased vaginal discharge can be malodorous and no signs of infection noted. Pessary cleaned and changed out 06/30/23 with decrease in drainage and marked edema/swelling of right labial majora and vaginal canal felt to be related to prior surgery.  She did have recurrent drop in Hgb ot 6.9 on 06/29 and was transfused with one unit PRBC. Also has had continued rise in WBC to 14.5 with low grade fever which is trending down and intermittent tachypnea noted. Foley remains in place and wound VAC to be removed today with recommendations for dry dressing.   Per staff, pt's had Foley long term- and came into hospital with one- will research this.   Per pt, just got Oxycodone  and pain dropped from 10/10 to 5/10.  Pain gets better when takes meds. LBM yesterday.    Review of Systems  Constitutional:  Negative for chills and fever.  HENT:  Negative for sore throat.   Eyes: Negative.   Respiratory:  Negative for shortness of breath.   Cardiovascular:  Negative for chest pain.  Gastrointestinal:  Negative for abdominal pain, constipation and nausea.  Genitourinary:        Has foley  Musculoskeletal:  Positive for joint pain. Negative for myalgias.       Phantom pain   as well  Neurological:  Positive for sensory change (has phantom pain) and weakness. Negative for headaches.  Psychiatric/Behavioral:  Positive for depression. The patient does not have insomnia.   All other systems reviewed and are negative.    Past Medical History:  Diagnosis Date  Cervical polyp 09/18/2018   Critical lower limb ischemia (HCC)    Encounter for screening mammogram for breast cancer 05/05/2016   Fibrocystic breast changes of both breasts 04/29/2015   Hyperlipidemia    Hypertension    PAD (peripheral artery disease) (HCC) 08/23/2019   Pelvic prolapse 06/2020   Pelvic ring in place, needs to be changed every 3 months    Stroke [facility]) 08/23/2019    Past Surgical History:  Procedure Laterality Date   ABDOMINAL AORTOGRAM W/LOWER EXTREMITY Bilateral 08/23/2019   Procedure: ABDOMINAL AORTOGRAM W/ Bilateral LOWER EXTREMITY [place];  Surgeon: Harvey Carlin BRAVO, MD;  Location: [facility] INVASIVE CV LAB;  Service: Cardiovascular;  Laterality: Bilateral;   AMPUTATION Right 05/31/2023   Procedure: AMPUTATION, ABOVE KNEE SARTORIOUS MUSCLE FLAP;  Surgeon: Serene Gaile ORN, MD;  Location: [facility] OR;  Service: Vascular;  Laterality: Right;   APPLICATION OF WOUND VAC Right 05/31/2023   Procedure: APPLICATION, WOUND VAC;  Surgeon: Serene Gaile ORN, MD;  Location: [facility] OR;  Service: Vascular;  Laterality: Right;   APPLICATION OF WOUND VAC Right 06/16/2023   Procedure: APPLICATION, WOUND VAC;  Surgeon: Serene Gaile ORN, MD;  Location: [facility] OR;  Service: Vascular;  Laterality: Right;   APPLICATION OF WOUND VAC Right 06/21/2023   Procedure: APPLICATION, WOUND VAC;  Surgeon: Serene Gaile ORN, MD;  Location: [facility] OR;  Service: Vascular;  Laterality: Right;   APPLICATION, SKIN SUBSTITUTE Right 05/12/2023   Procedure: APPLICATION, SKIN SUBSTITUTE KERECIS 38SQ CM;  Surgeon: Serene Gaile ORN, MD;  Location: [facility] OR;  Service: Vascular;  Laterality: Right;   APPLICATION, SKIN SUBSTITUTE Right 06/16/2023   Procedure: APPLICATION, SKIN SUBSTITUTE;  Surgeon: Serene Gaile ORN, MD;  [facility]: [facility];  Service: Vascular;  Laterality: Right;   APPLICATION, SKIN SUBSTITUTE Right 06/21/2023   Procedure: APPLICATION, SKIN SUBSTITUTE;  Surgeon: Serene Gaile ORN, MD;  [facility]: [facility];  Service: Vascular;  Laterality: Right;   AVGG REMOVAL Right 05/29/2023   Procedure: REMOVAL BYPASS GRAFT RIGHT LEG;  Surgeon: Lanis Fonda BRAVO, MD;  Location: [facility] OR;  Service: Vascular;  Laterality: Right;   AXILLARY-FEMORAL BYPASS GRAFT Right 11/11/2019   Procedure: BYPASS GRAFT RIGHT AXILLA-BIFEMORAL;  Surgeon: Harvey Carlin BRAVO, MD;  [facility];  Service: Vascular;  Laterality: Right;    AXILLARY-FEMORAL BYPASS GRAFT Left 06/21/2023   Procedure: CREATION, BYPASS, ARTERIAL, LEFT AXILLARY TO LEFT FEMORAL, USING GRAFT;  Surgeon: Serene Gaile ORN, MD;  [facility]: [facility] OR;  Service: Vascular;  Laterality: Left;   BRAIN SURGERY     BREAST SURGERY Bilateral 2014   1980 R breast surgery   CEREBRAL ANEURYSM REPAIR     ENDARTERECTOMY FEMORAL Bilateral 11/11/2019   Procedure: ENDARTERECTOMY FEMORAL BILATERALLY;  Surgeon: Harvey Carlin BRAVO, MD;  Location: [facility] OR;  Service: Vascular;  Laterality: Bilateral;   ENDARTERECTOMY FEMORAL Right 01/26/2022   Procedure: REDO RIGHT COMMON FEMORAL ENDARTERECTOMY USING 0.8CM X 7.6CM HEMASHIELD PLATINUM FINESSE PATCH;  Surgeon: Serene Gaile ORN, MD;  [facility];  Service: Vascular;  Laterality: Right;   FEMORAL-POPLITEAL BYPASS GRAFT Right 01/26/2022   Procedure: RIGHT FEMORAL-POPLITEAL BYPASS GRAFT USING GORE X 80CM PROPATEN GRAFT;  Surgeon: Serene Gaile ORN, MD;  [facility] OR;  Service: Vascular;  Laterality: Right;   FEMORAL-POPLITEAL BYPASS GRAFT Right 05/12/2023   Procedure: BYPASS GRAFT FEMORAL-POPLITEAL ARTERY;  Surgeon: Serene Gaile ORN, MD;  Location: [facility] OR;  Service: Vascular;  Laterality: Right;   HIP DISARTICULATION Right 06/28/2023   Procedure: MARGA, HIP;  Surgeon: Harden Jerona GAILS, MD;  [facility] OR;  Service: Orthopedics;  Laterality: Right;   INCISION AND DRAINAGE OF WOUND Right 05/31/2023   Procedure: IRRIGATION AND DEBRIDEMENT WOUND;  Surgeon: Serene Gaile ORN, MD;  [facility];  Service: Vascular;  Laterality: Right;  RIGHT GROIN WASHOUT   INCISION AND DRAINAGE OF WOUND Right 06/16/2023   Procedure: IRRIGATION AND DEBRIDEMENT WOUND;  Surgeon: Serene Gaile ORN, MD;  [facility];  Service: Vascular;  Laterality: Right;  Right Groin   INCISION AND DRAINAGE OF WOUND Right 06/21/2023   Procedure: IRRIGATION AND DEBRIDEMENT OF RIGHT GROIN AND RIGHT ABOVE KNEE AMPUTATION;  Surgeon: Serene Gaile ORN, MD;  Location: [facility] OR;   Service: Vascular;  Laterality: Right;   IR RADIOLOGIST EVAL & MGMT  04/02/2020   IR RADIOLOGIST EVAL & MGMT  03/29/2021   REMOVAL OF GRAFT Right 06/21/2023   Procedure: REMOVAL, GRAFT, RIGHT AXILLARY BIFEMORAL GRAFT;  Surgeon: Serene Gaile ORN, MD;  [facility];  Service: Vascular;  Laterality: Right;   REVISION AMPUTATION, BELOW THE KNEE Right 06/16/2023   Procedure: REVISION AMPUTATION, ABOVE THE KNEE;  Surgeon: Serene Gaile ORN, MD;  [facility];  Service: Vascular;  Laterality: Right;   THROMBECTOMY FEMORAL ARTERY Right 01/26/2022   Procedure: THROMBECTOMY RIGHT FEMORAL ARTERY;  Surgeon: Serene Gaile ORN, MD;  Location[facility];  Service: Vascular;  Laterality: Right;   THROMBECTOMY OF BYPASS GRAFT FEMORAL- TIBIAL ARTERY Right 05/12/2023   Procedure: THROMBECTOMY, BYPASS GRAFT, ARTERIAL;  Surgeon: Serene Gaile ORN, MD;  Location: [facility] OR;  Service: Vascular;  Laterality: Right;   TUBAL LIGATION  1990    Family History  Problem Relation Age of Onset   Heart Problems Father    Heart Problems Brother    Heart Problems Brother     Social History:  Lives with daughter. She reports that she quit smoking about a  years ago. Her smoking use included cigarettes. She has never been exposed to tobacco smoke. She has never used smokeless tobacco. She reports that she does not drink alcohol  and does not use drugs.   Allergies  Allergen Reactions   Penicillins Other (See Comments)    Shaky and Dizziness 20 years ago   Medications Prior to Admission  Medication Sig Dispense Refill   acetaminophen  (TYLENOL ) 325 MG tablet Take 2 tablets (650 mg total) by mouth every 6 (six) hours as needed for mild pain (pain score 1-3) or moderate pain (pain score 4-6).     alendronate (FOSAMAX) 70 MG tablet Take 70 mg by mouth once a week.     amLODipine  (NORVASC ) 10 MG tablet Take 1 tablet (10 mg total) by mouth daily. 30 tablet 0   ascorbic acid  (VITAMIN C ) 500 MG tablet Take 0.5 tablets (250 mg total) by  mouth 2 (two) times daily. 30 tablet 0   aspirin  EC 81 MG tablet Take 1 tablet (81 mg total) by mouth daily. Swallow whole. 30 tablet 12   ezetimibe  (ZETIA ) 10 MG tablet Take 1 tablet (10 mg total) by mouth daily. 30 tablet 0   ferrous sulfate  325 (65 FE) MG tablet Take 1 tablet (325 mg total) by mouth daily with breakfast. 30 tablet 0   gabapentin  (NEURONTIN ) 300 MG capsule Take 1 capsule (300 mg total) by mouth 2 (two) times daily. 60 capsule 0   hydrALAZINE  (APRESOLINE ) 25 MG tablet Take 1 tablet (25 mg total) by mouth every 8 (eight) hours. 90 tablet 0   hydrochlorothiazide  (HYDRODIURIL ) 12.5 MG tablet Take 1 tablet (12.5 mg  total) by mouth daily. 30 tablet 0   irbesartan  (AVAPRO ) 75 MG tablet Take 1 tablet (75 mg total) by mouth daily. 30 tablet 0   oxyCODONE  (OXY IR/ROXICODONE ) 5 MG immediate release tablet Take 1-2 tablets (5-10 mg total) by mouth every 4 (four) hours as needed for severe pain (pain score 7-10). 30 tablet 0   oxyCODONE  (OXYCONTIN ) 10 mg 12 hr tablet Take 1 tablet (10 mg total) by mouth at bedtime. 14 tablet 0   polyethylene glycol powder (GLYCOLAX /MIRALAX ) 17 GM/SCOOP powder Take 1 capful (17 g) by mouth daily as needed for mild constipation. 238 g 0   vancomycin  (VANCOREADY) 1250 MG/250ML SOLN Inject 250 mLs (1,250 mg total) into the vein daily.     Zinc  Sulfate 220 (50 Zn) MG TABS Take 1 tablet (220 mg total) by mouth daily. 30 tablet 0   vancomycin  IVPB Inject 1,500 mg into the vein daily. Indication:  Bacteremia/R-groin graft infection First Dose: Yes Last Day of Therapy:  07/12/23 Labs - Sunday/Monday:  CBC/D, BMP, and vancomycin  trough. Labs - Thursday:  BMP and vancomycin  trough Labs - Once weekly: ESR and CRP Method of administration:Elastomeric Method of administration may be changed at the discretion of the patient and/or caregiver's ability to self-administer the medication ordered.     vancomycin  IVPB Inject 1,250 mg into the vein daily. Indication:  BSI First  Dose: Yes Last Day of Therapy:  07/12/2023 Labs - Sunday/Monday:  CBC/D, BMP, and vancomycin  trough. Labs - Thursday:  BMP and vancomycin  trough Labs - Once weekly: ESR and CRP Method of administration:Elastomeric Method of administration may be changed at the discretion of the patient and/or caregiver's ability to self-administer the medication ordered. 30 Units 0      Home: Home Living Family/patient expects to be discharged to:: Private residence Living Arrangements: Children, Other relatives Available Help at Discharge: Family, Available 24 hours/day Type of Home: Mobile home Home Access: Ramped entrance, Stairs to enter [employer] of Steps: 5 Entrance Stairs-Rails: Right, Left, Can reach both Home Layout: One level Bathroom Shower/Tub: Sponge bathes at baseline Bathroom Toilet: Standard Bathroom Accessibility: Yes Home Equipment: Cane - single point, Agricultural consultant (2 wheels) Additional Comments: Pt admitted from CIR, progressing towards Mod I at w/c level  Lives With: Family   Functional History: Prior Function Prior Level of Function : Independent/Modified Independent Mobility Comments: Usually alternates between using SPC and RW ADLs Comments: Pt typically Independent with ADLs; prn for ADLs  Functional Status:  Mobility: Bed Mobility Overal bed mobility: Needs Assistance Bed Mobility: Supine to Sit Supine to sit: Supervision, HOB elevated, Used rails Sit to supine: Min assist General bed mobility comments: increased time, min cues for line awareness and technique, pt utilizing bed features. Transfers Overall transfer level: Needs assistance Equipment used: Rolling walker (2 wheels) Transfers: Sit to/from Stand, Bed to chair/wheelchair/BSC Sit to Stand: Min assist Bed to/from chair/wheelchair/BSC transfer type:: Stand pivot Stand pivot transfers: Contact guard assist Squat pivot transfers: Contact guard assist Step pivot transfers: Min  assist General transfer comment: EOB>chair with arm rests, pt using back of LLE for stability with sit>stand, cued for changing her body position to rely less on bed/chair frame as not all chairs have brakes, but pt with limited carryover of cues; variable CGA to light minA with reciprocal STS x 10 reps Ambulation/Gait Ambulation/Gait assistance: Min assist Gait Distance (Feet): 8 Feet Assistive device: Rolling walker (2 wheels) Gait Pattern/deviations:  (hop-to) General Gait Details: hop to gait pattern with RW and  low foot clearance with Min A for balance with backward hopping and intermittent assist for improved RW/proximity to RW. Gait velocity: decreased Gait velocity interpretation: <1.31 ft/sec, indicative of household ambulator    ADL: ADL Overall ADL's : Needs assistance/impaired Eating/Feeding: Set up, Sitting Grooming: Wash/dry face, Oral care, Set up, Sitting Upper Body Bathing: Set up, Sitting Lower Body Bathing: Contact guard assist, Sit to/from stand Upper Body Dressing : Set up, Sitting Lower Body Dressing: Contact guard assist, Sit to/from stand Lower Body Dressing Details (indicate cue type and reason): pt able to reach LLE to thread pants, in standing fair balance for task Toilet Transfer: Contact guard assist, Stand-pivot, Rolling walker (2 wheels) Toilet Transfer Details (indicate cue type and reason): Simulated in room Toileting- Clothing Manipulation and Hygiene: Sitting/lateral lean, Contact guard assist Toileting - Clothing Manipulation Details (indicate cue type and reason): CGA for balance Functional mobility during ADLs: Contact guard assist, Rolling walker (2 wheels) General ADL Comments: Pt with adequate standing balance to progress ADLs  Cognition: Cognition Orientation Level: Oriented X4 Cognition Arousal: Alert Behavior During Therapy: WFL for tasks assessed/performed   Blood pressure (!) 117/51, pulse 70, temperature 98.4 F (36.9 C),  temperature source Oral, resp. rate (!) 23, height (P) 5' 6 (1.676 m), weight 57 kg, SpO2 100%. Physical Exam Vitals and nursing note reviewed.  Constitutional:      General: She is not in acute distress.    Appearance: Normal appearance. She is normal weight.     Comments: Appears older than state age. Wincing at times due to sharp pains. Tended to keep eyes closed. Vitiligo left face and neck.  Sitting up in bed; c/o less R hip pain/phantom pain, NAD  HENT:     Head: Normocephalic and atraumatic.     Right Ear: External ear normal.     Left Ear: External ear normal.     Nose: Nose normal. No congestion.     Mouth/Throat:     Mouth: Mucous membranes are dry.     Pharynx: Oropharynx is clear. No oropharyngeal exudate.   Eyes:     General:        Right eye: No discharge.        Left eye: No discharge.     Extraocular Movements: Extraocular movements intact.    Cardiovascular:     Rate and Rhythm: Normal rate and regular rhythm.     Heart sounds: Normal heart sounds. No murmur heard.    No gallop.  Pulmonary:     Effort: Pulmonary effort is normal. No respiratory distress.     Breath sounds: Normal breath sounds. No wheezing, rhonchi or rales.  Abdominal:     General: There is no distension.     Palpations: Abdomen is soft.     Tenderness: There is no abdominal tenderness.     Comments: Normal bowel sounds  Genitourinary:    Comments: Foley in place with medium amber urine  Musculoskeletal:     Cervical back: Neck supple. No tenderness.     Comments: Right groin with dry padded dressing and appears to have old serosanguinous drainage. Dressing C/D/I and dated 6/30  Ue's 5-/5 B/L throughout LLE- 5-/5 in L leg- throughout RLE- Hip disarticulation- no leg at all to test    Skin:    General: Skin is warm and dry.     Comments: Incisions right and left shoulder and left groin puckered with skin glue.    Neurological:     General: No focal  deficit present.     Mental  Status: She is alert and oriented to person, place, and time.     Comments: Flat affect. Decreased to light touch in L foot/ankle, otherwise intact to light touch in all 3 limbs   Psychiatric:     Comments: Flat affect- interactive,      Results for orders placed or performed during the hospital encounter of 06/16/23 (from the past 48 hours)  CBC     Status: Abnormal   Collection Time: 07/02/23  2:30 PM  Result Value Ref Range   WBC 14.5 (H) 4.0 - 10.5 K/uL   RBC 2.36 (L) 3.87 - 5.11 MIL/uL   Hemoglobin 6.9 (LL) 12.0 - 15.0 g/dL    Comment: REPEATED TO VERIFY THIS CRITICAL RESULT HAS VERIFIED AND BEEN CALLED TO S GURUNG RN BY DAVID BLAIR ON 06 29 2025 AT 1507, AND HAS BEEN READ BACK.     HCT 21.8 (L) 36.0 - 46.0 %   MCV 92.4 80.0 - 100.0 fL   MCH 29.2 26.0 - 34.0 pg   MCHC 31.7 30.0 - 36.0 g/dL   RDW 84.8 88.4 - 84.4 %   Platelets 332 150 - 400 K/uL   nRBC 0.0 0.0 - 0.2 %    Comment: Performed at [facility] Lab, [address], [place], [place] [address]  Prepare RBC (crossmatch)     Status: None   Collection Time: 07/02/23  3:20 PM  Result Value Ref Range   Order Confirmation      ORDER PROCESSED BY BLOOD BANK Performed at [facility] Lab, [address], [place], [place] [address]   Type and screen [facility]     Status: None   Collection Time: 07/02/23  3:46 PM  Result Value Ref Range   ABO/RH(D) O POS    Antibody Screen NEG    Sample Expiration 07/05/2023,2359    Unit Number [id number]    Blood Component Type RED CELLS,LR    Unit division 00    Status of Unit ISSUED,FINAL    Transfusion Status OK TO TRANSFUSE    Crossmatch Result      Compatible Performed at [facility] Lab, [address], [place], [place] [address]   CBC     Status: Abnormal   Collection Time: 07/03/23  5:00 AM  Result Value Ref Range   WBC 14.0 (H) 4.0 - 10.5 K/uL   RBC 3.05 (L) 3.87 - 5.11 MIL/uL   Hemoglobin 8.8 (L) 12.0 - 15.0 g/dL    Comment: REPEATED TO  VERIFY POST TRANSFUSION SPECIMEN    HCT 26.6 (L) 36.0 - 46.0 %   MCV 87.2 80.0 - 100.0 fL   MCH 28.9 26.0 - 34.0 pg   MCHC 33.1 30.0 - 36.0 g/dL   RDW 84.2 (H) 88.4 - 84.4 %   Platelets 314 150 - 400 K/uL   nRBC 0.0 0.0 - 0.2 %    Comment: Performed at [facility] Lab, [address] [place], [place] [address]  Basic metabolic panel with GFR     Status: Abnormal   Collection Time: 07/03/23  5:00 AM  Result Value Ref Range   Sodium 130 (L) 135 - 145 mmol/L   Potassium 4.3 3.5 - 5.1 mmol/L   Chloride 95 (L) 98 - 111 mmol/L   CO2 27 22 - 32 mmol/L   Glucose, Bld 106 (H) 70 - 99 mg/dL    Comment: Glucose reference range applies only  to samples taken after fasting for at least 8 hours.   BUN 7 (L) 8 - 23 mg/dL   Creatinine, Ser 9.55 0.44 - 1.00 mg/dL   Calcium  8.6 (L) 8.9 - 10.3 mg/dL   GFR, Estimated >39 >39 mL/min    Comment: (NOTE) Calculated using the CKD-EPI Creatinine Equation (2021)    Anion gap 8 5 - 15    Comment: Performed at [facility] Lab, [address] [place], [place] [address]  CBC     Status: Abnormal   Collection Time: 07/04/23  7:37 AM  Result Value Ref Range   WBC 13.1 (H) 4.0 - 10.5 K/uL   RBC 3.14 (L) 3.87 - 5.11 MIL/uL   Hemoglobin 9.0 (L) 12.0 - 15.0 g/dL   HCT 72.2 (L) 63.9 - 53.9 %   MCV 88.2 80.0 - 100.0 fL   MCH 28.7 26.0 - 34.0 pg   MCHC 32.5 30.0 - 36.0 g/dL   RDW 84.5 88.4 - 84.4 %   Platelets 325 150 - 400 K/uL   nRBC 0.0 0.0 - 0.2 %    Comment: Performed at [facility] Lab, [address] [place], [place] [address]   No results found.    Blood pressure (!) 117/51, pulse 70, temperature 98.4 F (36.9 C), temperature source Oral, resp. rate (!) 23, height (P) 5' 6 (1.676 m), weight 57 kg, SpO2 100%.  Medical Problem List and Plan: 1. 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   Antithrombotics: -DVT/anticoagulation:  Pharmaceutical: Lovenox   -antiplatelet therapy: N/A 3. Pain Management: Oxycodone  prn. D/c IV dilaudid . Increase gabapentin  to TID  --tylenol  prn.  4. Mood/Behavior/Sleep: LCSW to follow for evaluation and support.   -antipsychotic agents: N/A 5. Neuropsych/cognition: This patient appears to be capable of making decisions on her own behalf. 6. Skin/Wound Care: Routine pressure relief measures. Continue Vitamin C  and protein supplements.  --daily monitoring of right axilla and Right groin wound  for healing.  7. Fluids/Electrolytes/Nutrition: Strict I/O with daily weights. Continue Ensure bid.  --Will also add juven/beneprotein to promote wound healing.  8.  MRSA bacteremia: To complete 6 week antibiotic course now on Linzolid 07/13/23 (confirmed w/ID NP that no further suppression needed).   --continue to monitor CK--last 1,132 on 06/16 9.  HTN: Monitor BP TID. On amlodipine , hydralazine  TID and metoprolol --BP trending low today  --will set parameters on meds to avoid orthostatic drop.  10. Acute blood loss anemia:  Last transfused with one unit 06/29. Hgb 6.9-->9.0 today  --check anemia panel. Iron supplement started on 06/29 11.  Leucocytosis: WBC trending down from 21.6 post surgery-->13.1   --recheck white count in am. 12. Hypoxia: --Will check CXR to follow up on pulmonary edema/intermittent tachypnea.   --note intermittent drop to low 80's last night/this am. 13.  Hyponatremia: Recheck in am. Wt 130--133 since hip disarticulation.   --monitor for signs of overload/likely multifactorial.  14.  Malnutrition: Monitor daily weighs--last alb @ 1.9.  Prostat and Juven also added. - check pre-albumin  level in am. Recheck Mg level in am.  15.  Pelvic prolapse: Pessary cleaned on 06/30/23.  Last dose diflucan  07/01        Sharlet GORMAN Schmitz, PA-C 07/04/2023   I have personally performed a face to face diagnostic evaluation of this patient and  formulated the key components of the plan.  Additionally, I have personally  reviewed laboratory data, imaging studies, as well as relevant notes and concur with the physician assistant's documentation above.   The patient's status has not changed from the original H&P.  Any changes in documentation from the acute care chart have been noted above.

## 2023-07-04 NOTE — Discharge Summary (Signed)
 Physician Discharge Summary   Patient: Katherine Archer MRN: [id number] DOB: 1955-10-22  Admit date:     06/16/2023  Discharge date: 07/04/23  Discharge Physician: Yetta Blanch  PCP: [name], MD  Recommendations at discharge: Follow-up with PCP in 1 week with a CBC and BMP. Follow-up with Dr. Harden in 1 week for wound recheck. Follow-up with vascular surgery. Follow-up with ID as appointment scheduled on 7/17.   Follow-up Information     Harden Jerona GAILS, MD Follow up in 1 week(s).   Specialty: Orthopedic Surgery Contact information: [address] [place] [address] [phone]         [name], MD. Schedule an appointment as soon as possible for a visit in 1 week(s).   Specialty: Family Medicine Why: with BMP lab to look at kidney and electrolytes, with CBC lab to look at blood counts Contact information: [address] [place] [address] [phone]         Serene Gaile ORN, MD. Schedule an appointment as soon as possible for a visit in 2 week(s).   Specialties: Vascular Surgery, Cardiology Contact information: [address] [place] [address] [phone]         Dea Shiner, MD. Go on 07/20/2023.   Specialty: Infectious Diseases Why: the Appointment As Scheduled Contact information: 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 Vascular surgery as well as orthopedic surgery were consulted. 1/24 right femoral endarterectomy and above-knee popliteal bypass 5/9 right femoral thrombectomy, right below-knee popliteal  bypass 5/26 ligation of the bypass and redo above and below knee popliteal bypass. 5/28 I&D of right groin wound.  With sartorius muscle flap.  As well as right AKA. 6/13 redo right AKA with I&D. 6/18 removal of the bypass graft and I&D of the AKA. 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 6/29 1 PRBC transfusion. 6/31 VAC removed.  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 Was on IV vancomycin  now on oral Zyvox . ID consult appreciated. Vascular surgery as well as orthopedic surgery following. Wound VAC was discontinued on 6/30. Continue dry dressing per Dr. Harden periodically to the surgical incision.  PAD. Vascular surgery consulted. Extensive vascular surgery requiring multiple redo procedures as well as graft removal. Outpt follow up. Resume aspirin  1 week later.   Vaginal candidiasis. Diflucan  for 1 week. Completed in the hospital.   Vaginal pessary. Appreciate GYN consultation. Last changed in feb 2025.  GYN recommended Outpatient follow-up and suggested pessaries can stay for sometimes upto 6 months.   HTN. Blood pressure stable on current regimen. Hold irbesartan  and hydrochlorothiazide .   Anemia secondary to acute blood loss. Transfuse for hemoglobin less than 7.  1 PRBC given on 6/26.  1 more PRBC in 6/29. Use Tylenol  next time as a premedication.  Severe protein calorie malnutrition.  Dietitian following. Placing the patient at a high risk for poor outcome.  Hyponatremia. Resolved.  Clinically nonsignificant.  Leukocytosis. Likely stress reaction. Monitor.  DVT prophylaxis. Family had questions about patient's DVT prophylaxis and requested  to switch to pills. I explained that given concern regards to anemia at present a pill DVT prophylaxis would not be safe due to long-acting nature of the medicine as well as proceed with a higher dose than heparin  5000 units twice  daily. Family understood.  Currently DVT prophylaxis on hold due to anemia.  PICC line. Currently does not require central access. Remove.   Pain control - [place]  Controlled Substance Reporting System database was reviewed. and patient was instructed, not to drive, operate heavy machinery, perform activities at heights, swimming or participation in water activities or provide baby-sitting services while on Pain, Sleep and Anxiety Medications; until their outpatient Physician has advised to do so again. Also recommended to not to take more than prescribed Pain, Sleep and Anxiety Medications.  Consultants:  Vascular surgery. Orthopedics   Procedures performed:  Multiple procedure on 6/13, 6/18 with vascular surgery for redo graft, AKA and removal of graft.  Hip disarticulation with Dr harden.  DISCHARGE MEDICATION: Allergies as of 07/04/2023       Reactions   Daptomycin     Elevated CK   Penicillins Other (See Comments)   Shaky and Dizziness 20 years ago        Medication List     STOP taking these medications    ferrous sulfate  325 (65 FE) MG tablet   hydrochlorothiazide  12.5 MG tablet Commonly known as: HYDRODIURIL    irbesartan  75 MG tablet Commonly known as: AVAPRO    vancomycin  1250 MG/250ML Soln Commonly known as: VANCOREADY   vancomycin  IVPB       TAKE these medications    acetaminophen  325 MG tablet Commonly known as: TYLENOL  Take 2 tablets (650 mg total) by mouth every 6 (six) hours as needed for mild pain (pain score 1-3) or moderate pain (pain score 4-6).   alendronate 70 MG tablet Commonly known as: FOSAMAX Take 70 mg by mouth once a week.   amLODipine  10 MG tablet Commonly known as: NORVASC  Tome 1 tableta (10 mg en total) por va oral diariamente. (Take 1 tablet (10 mg total) by mouth daily.)   ascorbic acid  500 MG tablet Commonly known as: VITAMIN C  Take 0.5 tablets (250 mg total) by mouth 2 (two) times daily.   aspirin  EC 81 MG  tablet Take 1 tablet (81 mg total) by mouth daily. Swallow whole. Start taking on: July 11, 2023 What changed: These instructions start on July 11, 2023. If you are unsure what to do until then, ask your doctor or other care provider.   ezetimibe  10 MG tablet Commonly known as: ZETIA  Tome 1 tableta (10 mg en total) por va oral diariamente. (Take 1 tablet (10 mg total) by mouth daily.)   Fe Fum-Vit C-Vit B12-FA Caps capsule Commonly known as: TRIGELS-F FORTE Tome 1 cpsula por va oral diariamente. (Take 1 capsule by mouth daily.)   feeding supplement Liqd Take 237 mLs by mouth 2 (two) times daily between meals.   gabapentin  300 MG capsule Commonly known as: NEURONTIN  Take 1 capsule (300 mg total) by mouth 2 (two) times daily.   hydrALAZINE  25 MG tablet Commonly known as: APRESOLINE  Take 1 tablet (25 mg total) by mouth every 8 (eight) hours.   linezolid  600 MG tablet Commonly known as: ZYVOX  Take 1 tablet (600 mg total) by mouth every 12 (twelve) hours for 8 days.   metoprolol  succinate 25 MG 24 hr tablet Commonly known as: TOPROL -XL Take 0.5 tablets (12.5 mg total) by mouth daily.   multivitamin with minerals  Tabs tablet Tome 1 tableta por va oral diariamente. (Take 1 tablet by mouth daily.)   oxyCODONE  5 MG immediate release tablet Commonly known as: Oxy IR/ROXICODONE  Take 1-2 tablets (5-10 mg total) by mouth every 4 (four) hours as needed for severe pain (pain score 7-10).   oxyCODONE  10 mg 12 hr tablet Commonly known as: OXYCONTIN  Tome 1 tableta (10 mg en total) por va oral antes de acostarse. (Take 1 tablet (10 mg total) by mouth at bedtime.)   pantoprazole  40 MG tablet Commonly known as: PROTONIX  Tome 1 tableta (40 mg en total) por va oral diariamente. (Take 1 tablet (40 mg total) by mouth daily.)   polyethylene glycol powder 17 GM/SCOOP powder Commonly known as: GLYCOLAX /MIRALAX  Take 1 capful (17 g) by mouth daily as needed for mild constipation.    senna-docusate 8.6-50 MG tablet Commonly known as: Senokot-S Take 1 tablet by mouth 2 (two) times daily.   Zinc  Sulfate 220 (50 Zn) MG Tabs Take 1 tablet (220 mg total) by mouth daily.               Discharge Care Instructions  (From admission, onward)           Start     Ordered   07/04/23 0000  Discharge wound care:       Comments: Dry dressing changes daily and as needed per Dr duda   07/04/23 1044           Disposition: CIR Diet recommendation: Regular diet  Discharge Exam: Vitals:   07/04/23 0612 07/04/23 0741 07/04/23 1058 07/04/23 1538  BP: (!) 109/48 (!) 117/51 (!) 113/43 (!) 130/55  Pulse:  70 70 71  Resp: 16 (!) 23  19  Temp: 98.8 F (37.1 C) 98.4 F (36.9 C) 98.2 F (36.8 C) 98.5 F (36.9 C)  TempSrc: Oral Oral    SpO2: 100% 100% 97% 100%  Weight:      Height:       General: Appear in mild distress; no visible Abnormal Neck Mass Or lumps, Conjunctiva normal Multiple surgical wounds healing well.but tender.  Cardiovascular: S1 and S2 Present, no Murmur, Respiratory: good respiratory effort, Bilateral Air entry present and CTA, no Crackles, no wheezes Abdomen: Bowel Sound present, tender at surgical sites.  Extremities: no Pedal edema, right hip disarticulation Neurology: alert and oriented to time, place, and person  [facility] Weights   06/30/23 0546 07/01/23 2312 07/03/23 0643  Weight: 59.1 kg 60.4 kg 57 kg   Condition at discharge: stable  The results of significant diagnostics from this hospitalization (including imaging, microbiology, ancillary and laboratory) are listed below for reference.   Imaging Studies: DG Abd Portable 1V Result Date: 06/28/2023 CLINICAL DATA:  Abdominal pain and distention. EXAM: PORTABLE ABDOMEN - 1 VIEW COMPARISON:  [place] bi-iliac CTA dated 05/29/2023. FINDINGS: Interval amputation of the right leg with no bone destruction or periosteal reaction involving the pelvic bones. No soft tissue gas. Right pelvic skin  clips. Normal bowel-gas pattern. Right lower skin clips. Pessary. IMPRESSION: 1. Interval amputation of the right leg. 2. No acute abnormality. Electronically Signed   By: Elspeth Bathe M.D.   On: 06/28/2023 15:28   US  EKG Site Rite Result Date: 06/14/2023 If Site Rite image not attached, placement could not be confirmed due to current cardiac rhythm.   Microbiology: Results for orders placed or performed during the hospital encounter of 06/16/23  MRSA Next Gen by PCR, Nasal     Status: None   Collection Time:  06/27/23  4:43 PM   Specimen: Nasal Mucosa; Nasal Swab  Result Value Ref Range Status   MRSA by PCR Next Gen NOT DETECTED NOT DETECTED Final    Comment: (NOTE) The GeneXpert MRSA Assay (FDA approved for NASAL specimens only), is one component of a comprehensive MRSA colonization surveillance program. It is not intended to diagnose MRSA infection nor to guide or monitor treatment for MRSA infections. Test performance is not FDA approved in patients less than 55 years old. Performed at [facility], [address], [place], [place] [address]    Labs: CBC: Recent Labs  Lab 06/28/23 [phone] 06/29/23 0507 06/29/23 0947 06/30/23 0419 06/30/23 1652 07/01/23 0829 07/02/23 0500 07/02/23 1430 07/03/23 0500 07/04/23 0737  WBC 10.3 21.6*   < > 18.7*   < > 16.2* 15.1* 14.5* 14.0* 13.1*  NEUTROABS 7.4 17.4*  --  13.9*  --   --   --   --   --   --   HGB 10.1* 7.2*   < > 7.4*   < > 7.2* 7.0* 6.9* 8.8* 9.0*  HCT 31.1* 22.9*   < > 23.8*   < > 22.8* 21.5* 21.8* 26.6* 27.7*  MCV 90.7 93.1   < > 93.0   < > 92.3 90.7 92.4 87.2 88.2  PLT 309 369   < > 356   < > 313 315 332 314 325   < > = values in this interval not displayed.   Basic Metabolic Panel: Recent Labs  Lab 06/28/23 0540 06/29/23 0507 06/30/23 0419 07/01/23 0539 07/01/23 0829 07/03/23 0500  NA 133* 132* 131*  --  131* 130*  K 4.0 4.4 4.3  --  4.1 4.3  CL 100 98 94*  --  96* 95*  CO2 26 25 27   --  29 27  GLUCOSE  101* 114* 97  --  119* 106*  BUN 10 8 11   --  6* 7*  CREATININE 0.50 0.49 0.51  --  0.49 0.44  CALCIUM  8.8* 9.1 8.6*  --  8.8* 8.6*  MG  --   --  1.9 2.0  --   --    Liver Function Tests: No results for input(s): AST, ALT, ALKPHOS, BILITOT, PROT, ALBUMIN  in the last 168 hours. CBG: No results for input(s): GLUCAP in the last 168 hours.  Discharge time spent: greater than 30 minutes.  Author: Yetta Blanch, MD  [facility] 07/04/2023

## 2023-07-04 NOTE — H&P (Signed)
 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 Serene with ID recommending IV antibiotics followed by lifelong suppression with doxycycline.  She was admitted for intensive rehab program on 06/07/23 with wound VAC to right groin.    She continued to have issues with pain control was found to have copious drainage with necrosis of medial aspect of wound as well as ischemia with necrosis of R-AKA incision. She was discharged to acute hospital on 06/16/23 for revision of AKA and I & D of groin wound but continued to have intermittent fevers up to 102 despite IV Vanc as well as issues with pain. Palliative care consulted and patient planned to continue treatment and surgical intervention as needed. She underwent removal of right axillofemoral and femorofemoral bypass on 06/18.  Dr. 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  Dr. 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    Dr. Fredirick consulted for input due on pessary as well as ongoing issues with white discharge  which has been treated with diflucan .   She reported that patient with pessaries frequently have increased vaginal discharge can be malodorous and no signs of infection noted. Pessary cleaned and changed out 06/30/23 with decrease in drainage and marked edema/swelling of right labial majora and vaginal canal felt to be related to prior surgery.  She did have recurrent drop in Hgb ot 6.9 on 06/29 and was transfused with one unit PRBC. Also has had continued rise in WBC to 14.5 with low grade fever which is trending down and intermittent tachypnea noted. Foley remains in place and wound VAC to be removed today with recommendations for dry dressing.    Per staff, pt's had Foley long term- and came into hospital with one- will research this.   Per pt, just got Oxycodone  and pain dropped from 10/10 to 5/10.  Pain gets better when takes meds. LBM yesterday.       Review of Systems  Constitutional:  Negative for chills and fever.  HENT:  Negative for sore throat.   Eyes: Negative.   Respiratory:  Negative for shortness of breath.   Cardiovascular:  Negative for chest pain.  Gastrointestinal:  Negative for abdominal pain, constipation and nausea.  Genitourinary:        Has foley  Musculoskeletal:  Positive for joint pain. Negative for myalgias.       Phantom pain   as well  Neurological:  Positive for sensory change (has phantom pain) and weakness. Negative for headaches.  Psychiatric/Behavioral:  Positive for depression. The patient does not have insomnia.   All other systems reviewed and  are negative.           Past Medical History:  Diagnosis Date   Cervical polyp 09/18/2018   Critical lower limb ischemia (HCC)     Encounter for screening mammogram for breast cancer 05/05/2016   Fibrocystic breast changes of both breasts 04/29/2015   Hyperlipidemia     Hypertension     PAD (peripheral artery disease) (HCC) 08/23/2019   Pelvic prolapse 06/2020    Pelvic ring in place, needs to be  changed every 3 months   Stroke (HCC) 08/23/2019               Past Surgical History:  Procedure Laterality Date   ABDOMINAL AORTOGRAM W/LOWER EXTREMITY Bilateral 08/23/2019    Procedure: ABDOMINAL AORTOGRAM W/ Bilateral LOWER EXTREMITY [place];  Surgeon: Harvey Carlin BRAVO, MD;  Location: [facility] INVASIVE CV LAB;  Service: Cardiovascular;  Laterality: Bilateral;   AMPUTATION Right 05/31/2023    Procedure: AMPUTATION, ABOVE KNEE SARTORIOUS MUSCLE FLAP;  Surgeon: Serene Gaile ORN, MD;  Location: [facility] OR;  Service: Vascular;  Laterality: Right;   APPLICATION OF WOUND VAC Right 05/31/2023    Procedure: APPLICATION, WOUND VAC;  Surgeon: Serene Gaile ORN, MD;  Location: [facility] OR;  Service: Vascular;  Laterality: Right;   APPLICATION OF WOUND VAC Right 06/16/2023    Procedure: APPLICATION, WOUND VAC;  Surgeon: Serene Gaile ORN, MD;  Location: [facility] OR;  Service: Vascular;  Laterality: Right;   APPLICATION OF WOUND VAC Right 06/21/2023    Procedure: APPLICATION, WOUND VAC;  Surgeon: Serene Gaile ORN, MD;  Location: [facility] OR;  Service: Vascular;  Laterality: Right;   APPLICATION, SKIN SUBSTITUTE Right 05/12/2023    Procedure: APPLICATION, SKIN SUBSTITUTE KERECIS 38SQ CM;  Surgeon: Serene Gaile ORN, MD;  Location: [facility] OR;  Service: Vascular;  Laterality: Right;   APPLICATION, SKIN SUBSTITUTE Right 06/16/2023    Procedure: APPLICATION, SKIN SUBSTITUTE;  Surgeon: Serene Gaile ORN, MD;  [facility]: [facility];  Service: Vascular;  Laterality: Right;   APPLICATION, SKIN SUBSTITUTE Right 06/21/2023    Procedure: APPLICATION, SKIN SUBSTITUTE;  Surgeon: Serene Gaile ORN, MD;  Location[facility];  Service: Vascular;  Laterality: Right;   AVGG REMOVAL Right 05/29/2023    Procedure: REMOVAL BYPASS GRAFT RIGHT LEG;  Surgeon: Lanis Fonda BRAVO, MD;  Location: [facility] OR;  Service: Vascular;  Laterality: Right;   AXILLARY-FEMORAL BYPASS GRAFT Right 11/11/2019    Procedure: BYPASS GRAFT RIGHT AXILLA-BIFEMORAL;  Surgeon: Harvey Carlin BRAVO, MD;  [facility]  OR;  Service: Vascular;  Laterality: Right;   AXILLARY-FEMORAL BYPASS GRAFT Left 06/21/2023    Procedure: CREATION, BYPASS, ARTERIAL, LEFT AXILLARY TO LEFT FEMORAL, USING GRAFT;  Surgeon: Serene Gaile ORN, MD;  Location: [facility] OR;  Service: Vascular;  Laterality: Left;   BRAIN SURGERY       BREAST SURGERY Bilateral 2014    1980 R breast surgery   CEREBRAL ANEURYSM REPAIR       ENDARTERECTOMY FEMORAL Bilateral 11/11/2019    Procedure: ENDARTERECTOMY FEMORAL BILATERALLY;  Surgeon: Harvey Carlin BRAVO, MD;  Location: [facility] OR;  Service: Vascular;  Laterality: Bilateral;   ENDARTERECTOMY FEMORAL Right 01/26/2022    Procedure: REDO RIGHT COMMON FEMORAL ENDARTERECTOMY USING 0.8CM X 7.6CM HEMASHIELD PLATINUM FINESSE PATCH;  Surgeon: Serene Gaile ORN, MD;  [facility] OR;  Service: Vascular;  Laterality: Right;   FEMORAL-POPLITEAL BYPASS GRAFT Right 01/26/2022    Procedure: RIGHT FEMORAL-POPLITEAL BYPASS GRAFT USING GORE X 80CM PROPATEN GRAFT;  Surgeon: Serene Gaile ORN, MD;  [facility];  Service: Vascular;  Laterality: Right;   FEMORAL-POPLITEAL BYPASS GRAFT Right 05/12/2023    Procedure: BYPASS GRAFT FEMORAL-POPLITEAL ARTERY;  Surgeon: Serene Gaile ORN, MD;  Location: [facility] OR;  Service: Vascular;  Laterality: Right;   HIP DISARTICULATION Right 06/28/2023    Procedure: MARGA, HIP;  Surgeon: Harden Jerona GAILS, MD;  Location: [facility] OR;  Service: Orthopedics;  Laterality: Right;   INCISION AND DRAINAGE OF WOUND Right 05/31/2023    Procedure: IRRIGATION AND DEBRIDEMENT WOUND;  Surgeon: Serene Gaile ORN, MD;  [facility] OR;  Service: Vascular;  Laterality: Right;  RIGHT GROIN WASHOUT   INCISION AND DRAINAGE OF WOUND Right 06/16/2023    Procedure: IRRIGATION AND DEBRIDEMENT WOUND;  Surgeon: Serene Gaile ORN, MD;  [facility];  Service: Vascular;  Laterality: Right;  Right Groin   INCISION AND DRAINAGE OF WOUND Right 06/21/2023    Procedure: IRRIGATION AND DEBRIDEMENT OF RIGHT GROIN AND RIGHT ABOVE KNEE  AMPUTATION;  Surgeon: Serene Gaile ORN, MD;  Location: [facility] OR;  Service: Vascular;  Laterality: Right;   IR RADIOLOGIST EVAL & MGMT   04/02/2020   IR RADIOLOGIST EVAL & MGMT   03/29/2021   REMOVAL OF GRAFT Right 06/21/2023    Procedure: REMOVAL, GRAFT, RIGHT AXILLARY BIFEMORAL GRAFT;  Surgeon: Serene Gaile ORN, MD;  [facility];  Service: Vascular;  Laterality: Right;   REVISION AMPUTATION, BELOW THE KNEE Right 06/16/2023    Procedure: REVISION AMPUTATION, ABOVE THE KNEE;  Surgeon: Serene Gaile ORN, MD;  [facility];  Service: Vascular;  Laterality: Right;   THROMBECTOMY FEMORAL ARTERY Right 01/26/2022    Procedure: THROMBECTOMY RIGHT FEMORAL ARTERY;  Surgeon: Serene Gaile ORN, MD;  Location: [facility] OR;  Service: Vascular;  Laterality: Right;   THROMBECTOMY OF BYPASS GRAFT FEMORAL- TIBIAL ARTERY Right 05/12/2023    Procedure: THROMBECTOMY, BYPASS GRAFT, ARTERIAL;  Surgeon: Serene Gaile ORN, MD;  Location: [facility] OR;  Service: Vascular;  Laterality: Right;   TUBAL LIGATION   1990               Family History  Problem Relation Age of Onset   Heart Problems Father     Heart Problems Brother     Heart Problems Brother            Social History:  Lives with daughter. She reports that she quit smoking about a  years ago. Her smoking use included cigarettes. She has never been exposed to tobacco smoke. She has never used smokeless tobacco. She reports that she does not drink alcohol  and does not use drugs.     Allergies       Allergies  Allergen Reactions   Penicillins Other (See Comments)      Shaky and Dizziness 20 years ago            Medications Prior to Admission  Medication Sig Dispense Refill   acetaminophen  (TYLENOL ) 325 MG tablet Take 2 tablets (650 mg total) by mouth every 6 (six) hours as needed for mild pain (pain score 1-3) or moderate pain (pain score 4-6).       alendronate (FOSAMAX) 70 MG tablet Take 70 mg by mouth once a week.       amLODipine  (NORVASC ) 10 MG tablet Take  1 tablet (10 mg total) by mouth daily. 30 tablet 0   ascorbic acid  (VITAMIN C ) 500 MG tablet Take 0.5 tablets (250 mg total) by mouth 2 (two) times daily. 30 tablet 0   aspirin  EC 81 MG tablet Take 1  tablet (81 mg total) by mouth daily. Swallow whole. 30 tablet 12   ezetimibe  (ZETIA ) 10 MG tablet Take 1 tablet (10 mg total) by mouth daily. 30 tablet 0   ferrous sulfate  325 (65 FE) MG tablet Take 1 tablet (325 mg total) by mouth daily with breakfast. 30 tablet 0   gabapentin  (NEURONTIN ) 300 MG capsule Take 1 capsule (300 mg total) by mouth 2 (two) times daily. 60 capsule 0   hydrALAZINE  (APRESOLINE ) 25 MG tablet Take 1 tablet (25 mg total) by mouth every 8 (eight) hours. 90 tablet 0   hydrochlorothiazide  (HYDRODIURIL ) 12.5 MG tablet Take 1 tablet (12.5 mg total) by mouth daily. 30 tablet 0   irbesartan  (AVAPRO ) 75 MG tablet Take 1 tablet (75 mg total) by mouth daily. 30 tablet 0   oxyCODONE  (OXY IR/ROXICODONE ) 5 MG immediate release tablet Take 1-2 tablets (5-10 mg total) by mouth every 4 (four) hours as needed for severe pain (pain score 7-10). 30 tablet 0   oxyCODONE  (OXYCONTIN ) 10 mg 12 hr tablet Take 1 tablet (10 mg total) by mouth at bedtime. 14 tablet 0   polyethylene glycol powder (GLYCOLAX /MIRALAX ) 17 GM/SCOOP powder Take 1 capful (17 g) by mouth daily as needed for mild constipation. 238 g 0   vancomycin  (VANCOREADY) 1250 MG/250ML SOLN Inject 250 mLs (1,250 mg total) into the vein daily.       Zinc  Sulfate 220 (50 Zn) MG TABS Take 1 tablet (220 mg total) by mouth daily. 30 tablet 0   vancomycin  IVPB Inject 1,500 mg into the vein daily. Indication:  Bacteremia/R-groin graft infection First Dose: Yes Last Day of Therapy:  07/12/23 Labs - Sunday/Monday:  CBC/D, BMP, and vancomycin  trough. Labs - Thursday:  BMP and vancomycin  trough Labs - Once weekly: ESR and CRP Method of administration:Elastomeric Method of administration may be changed at the discretion of the patient and/or caregiver's  ability to self-administer the medication ordered.       vancomycin  IVPB Inject 1,250 mg into the vein daily. Indication:  BSI First Dose: Yes Last Day of Therapy:  07/12/2023 Labs - Sunday/Monday:  CBC/D, BMP, and vancomycin  trough. Labs - Thursday:  BMP and vancomycin  trough Labs - Once weekly: ESR and CRP Method of administration:Elastomeric Method of administration may be changed at the discretion of the patient and/or caregiver's ability to self-administer the medication ordered. 30 Units 0              Home: Home Living Family/patient expects to be discharged to:: Private residence Living Arrangements: Children, Other relatives Available Help at Discharge: Family, Available 24 hours/day Type of Home: Mobile home Home Access: Ramped entrance, Stairs to enter [employer] of Steps: 5 Entrance Stairs-Rails: Right, Left, Can reach both Home Layout: One level Bathroom Shower/Tub: Sponge bathes at baseline Bathroom Toilet: Standard Bathroom Accessibility: Yes Home Equipment: Cane - single point, Agricultural consultant (2 wheels) Additional Comments: Pt admitted from CIR, progressing towards Mod I at w/c level  Lives With: Family   Functional History: Prior Function Prior Level of Function : Independent/Modified Independent Mobility Comments: Usually alternates between using SPC and RW ADLs Comments: Pt typically Independent with ADLs; prn for ADLs   Functional Status:  Mobility: Bed Mobility Overal bed mobility: Needs Assistance Bed Mobility: Supine to Sit Supine to sit: Supervision, HOB elevated, Used rails Sit to supine: Min assist General bed mobility comments: increased time, min cues for line awareness and technique, pt utilizing bed features. Transfers Overall transfer level: Needs assistance Equipment used:  Rolling walker (2 wheels) Transfers: Sit to/from Stand, Bed to chair/wheelchair/BSC Sit to Stand: Min assist Bed to/from chair/wheelchair/BSC transfer  type:: Stand pivot Stand pivot transfers: Contact guard assist Squat pivot transfers: Contact guard assist Step pivot transfers: Min assist General transfer comment: EOB>chair with arm rests, pt using back of LLE for stability with sit>stand, cued for changing her body position to rely less on bed/chair frame as not all chairs have brakes, but pt with limited carryover of cues; variable CGA to light minA with reciprocal STS x 10 reps Ambulation/Gait Ambulation/Gait assistance: Min assist Gait Distance (Feet): 8 Feet Assistive device: Rolling walker (2 wheels) Gait Pattern/deviations:  (hop-to) General Gait Details: hop to gait pattern with RW and low foot clearance with Min A for balance with backward hopping and intermittent assist for improved RW/proximity to RW. Gait velocity: decreased Gait velocity interpretation: <1.31 ft/sec, indicative of household ambulator   ADL: ADL Overall ADL's : Needs assistance/impaired Eating/Feeding: Set up, Sitting Grooming: Wash/dry face, Oral care, Set up, Sitting Upper Body Bathing: Set up, Sitting Lower Body Bathing: Contact guard assist, Sit to/from stand Upper Body Dressing : Set up, Sitting Lower Body Dressing: Contact guard assist, Sit to/from stand Lower Body Dressing Details (indicate cue type and reason): pt able to reach LLE to thread pants, in standing fair balance for task Toilet Transfer: Contact guard assist, Stand-pivot, Rolling walker (2 wheels) Toilet Transfer Details (indicate cue type and reason): Simulated in room Toileting- Clothing Manipulation and Hygiene: Sitting/lateral lean, Contact guard assist Toileting - Clothing Manipulation Details (indicate cue type and reason): CGA for balance Functional mobility during ADLs: Contact guard assist, Rolling walker (2 wheels) General ADL Comments: Pt with adequate standing balance to progress ADLs   Cognition: Cognition Orientation Level: Oriented X4 Cognition Arousal:  Alert Behavior During Therapy: WFL for tasks assessed/performed     Blood pressure (!) 117/51, pulse 70, temperature 98.4 F (36.9 C), temperature source Oral, resp. rate (!) 23, height (P) 5' 6 (1.676 m), weight 57 kg, SpO2 100%. Physical Exam Vitals and nursing note reviewed.  Constitutional:      General: She is not in acute distress.    Appearance: Normal appearance. She is normal weight.     Comments: Appears older than state age. Wincing at times due to sharp pains. Tended to keep eyes closed. Vitiligo left face and neck.  Sitting up in bed; c/o less R hip pain/phantom pain, NAD  HENT:     Head: Normocephalic and atraumatic.     Right Ear: External ear normal.     Left Ear: External ear normal.     Nose: Nose normal. No congestion.     Mouth/Throat:     Mouth: Mucous membranes are dry.     Pharynx: Oropharynx is clear. No oropharyngeal exudate.    Eyes:     General:        Right eye: No discharge.        Left eye: No discharge.     Extraocular Movements: Extraocular movements intact.      Cardiovascular:     Rate and Rhythm: Normal rate and regular rhythm.     Heart sounds: Normal heart sounds. No murmur heard.    No gallop.  Pulmonary:     Effort: Pulmonary effort is normal. No respiratory distress.     Breath sounds: Normal breath sounds. No wheezing, rhonchi or rales.  Abdominal:     General: There is no distension.     Palpations: Abdomen is  soft.     Tenderness: There is no abdominal tenderness.     Comments: Normal bowel sounds  Genitourinary:    Comments: Foley in place with medium amber urine   Musculoskeletal:     Cervical back: Neck supple. No tenderness.     Comments: Right groin with dry padded dressing and appears to have old serosanguinous drainage. Dressing C/D/I and dated 6/30  Ue's 5-/5 B/L throughout LLE- 5-/5 in L leg- throughout RLE- Hip disarticulation- no leg at all to test     Skin:    General: Skin is warm and dry.     Comments:  Incisions right and left shoulder and left groin puckered with skin glue.     Neurological:     General: No focal deficit present.     Mental Status: She is alert and oriented to person, place, and time.     Comments: Flat affect. Decreased to light touch in L foot/ankle, otherwise intact to light touch in all 3 limbs   Psychiatric:     Comments: Flat affect- interactive,         Lab Results Last 48 Hours        Results for orders placed or performed during the hospital encounter of 06/16/23 (from the past 48 hours)  CBC     Status: Abnormal    Collection Time: 07/02/23  2:30 PM  Result Value Ref Range    WBC 14.5 (H) 4.0 - 10.5 K/uL    RBC 2.36 (L) 3.87 - 5.11 MIL/uL    Hemoglobin 6.9 (LL) 12.0 - 15.0 g/dL      Comment: REPEATED TO VERIFY THIS CRITICAL RESULT HAS VERIFIED AND BEEN CALLED TO S GURUNG RN BY DAVID BLAIR ON 06 29 2025 AT 1507, AND HAS BEEN READ BACK.       HCT 21.8 (L) 36.0 - 46.0 %    MCV 92.4 80.0 - 100.0 fL    MCH 29.2 26.0 - 34.0 pg    MCHC 31.7 30.0 - 36.0 g/dL    RDW 84.8 88.4 - 84.4 %    Platelets 332 150 - 400 K/uL    nRBC 0.0 0.0 - 0.2 %      Comment: Performed at [facility] Lab, [address], [place], [place] [address]  Prepare RBC (crossmatch)     Status: None    Collection Time: 07/02/23  3:20 PM  Result Value Ref Range    Order Confirmation          ORDER PROCESSED BY BLOOD BANK Performed at [facility], [address], [place], [place] [address]    Type and screen [facility]     Status: None    Collection Time: 07/02/23  3:46 PM  Result Value Ref Range    ABO/RH(D) O POS      Antibody Screen NEG      Sample Expiration 07/05/2023,2359      Unit Number [id number]      Blood Component Type RED CELLS,LR      Unit division 00      Status of Unit ISSUED,FINAL      Transfusion Status OK TO TRANSFUSE      Crossmatch Result          Compatible Performed at [facility], [address] [place], [place]  [address]    CBC     Status: Abnormal    Collection Time: 07/03/23  5:00 AM  Result Value  Ref Range    WBC 14.0 (H) 4.0 - 10.5 K/uL    RBC 3.05 (L) 3.87 - 5.11 MIL/uL    Hemoglobin 8.8 (L) 12.0 - 15.0 g/dL      Comment: REPEATED TO VERIFY POST TRANSFUSION SPECIMEN      HCT 26.6 (L) 36.0 - 46.0 %    MCV 87.2 80.0 - 100.0 fL    MCH 28.9 26.0 - 34.0 pg    MCHC 33.1 30.0 - 36.0 g/dL    RDW 84.2 (H) 88.4 - 15.5 %    Platelets 314 150 - 400 K/uL    nRBC 0.0 0.0 - 0.2 %      Comment: Performed at [facility], [address] [place], [place] [address]  Basic metabolic panel with GFR     Status: Abnormal    Collection Time: 07/03/23  5:00 AM  Result Value Ref Range    Sodium 130 (L) 135 - 145 mmol/L    Potassium 4.3 3.5 - 5.1 mmol/L    Chloride 95 (L) 98 - 111 mmol/L    CO2 27 22 - 32 mmol/L    Glucose, Bld 106 (H) 70 - 99 mg/dL      Comment: Glucose reference range applies only to samples taken after fasting for at least 8 hours.    BUN 7 (L) 8 - 23 mg/dL    Creatinine, Ser 9.55 0.44 - 1.00 mg/dL    Calcium  8.6 (L) 8.9 - 10.3 mg/dL    GFR, Estimated >39 >39 mL/min      Comment: (NOTE) Calculated using the CKD-EPI Creatinine Equation (2021)      Anion gap 8 5 - 15      Comment: Performed at [facility], [address] [place], [place] [address]  CBC     Status: Abnormal    Collection Time: 07/04/23  7:37 AM  Result Value Ref Range    WBC 13.1 (H) 4.0 - 10.5 K/uL    RBC 3.14 (L) 3.87 - 5.11 MIL/uL    Hemoglobin 9.0 (L) 12.0 - 15.0 g/dL    HCT 72.2 (L) 63.9 - 46.0 %    MCV 88.2 80.0 - 100.0 fL    MCH 28.7 26.0 - 34.0 pg    MCHC 32.5 30.0 - 36.0 g/dL    RDW 84.5 88.4 - 84.4 %    Platelets 325 150 - 400 K/uL    nRBC 0.0 0.0 - 0.2 %      Comment: Performed at [facility], [address], [place], [place] [address]      Imaging Results (Last 48 hours)  No results found.         Blood pressure (!) 117/51, pulse 70, temperature 98.4 F (36.9 C),  temperature source Oral, resp. rate (!) 23, height (P) 5' 6 (1.676 m), weight 57 kg, SpO2 100%.   Medical Problem List and Plan: 1. 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  Antithrombotics: -DVT/anticoagulation:  Pharmaceutical: Lovenox              -antiplatelet therapy: N/A 3. Pain Management: Oxycodone  prn. D/c IV dilaudid . Increase gabapentin  to TID             --tylenol  prn.  4. Mood/Behavior/Sleep: LCSW to follow for evaluation and support.              -antipsychotic agents: N/A 5. Neuropsych/cognition: This patient appears to be capable of making decisions on her own behalf. 6. Skin/Wound Care: Routine pressure relief measures. Continue Vitamin C  and protein supplements.  --daily monitoring of right axilla and Right groin wound  for healing.  7. Fluids/Electrolytes/Nutrition: Strict I/O with daily weights. Continue Ensure bid.  --Will also add juven/beneprotein to promote wound healing.  8.  MRSA bacteremia: To complete 6 week antibiotic course now on Linzolid 07/13/23 (confirmed w/ID NP that no further suppression needed).              --continue to monitor CK--last 1,132 on 06/16 9.  HTN: Monitor BP TID. On amlodipine , hydralazine  TID and metoprolol --BP trending low today             --will set parameters on meds to avoid orthostatic drop.  10. Acute blood loss anemia:  Last transfused with one unit 06/29. Hgb 6.9-->9.0 today             --check anemia panel. Iron supplement started on 06/29 11.  Leucocytosis: WBC trending down from 21.6 post surgery-->13.1              --recheck white count in am. 12. Hypoxia: --Will check CXR to follow up on pulmonary edema/intermittent tachypnea.              --note intermittent drop to low 80's last night/this am. 13.  Hyponatremia: Recheck in am. Wt 130--133 since hip disarticulation.               --monitor for signs of overload/likely multifactorial.  14.  Malnutrition: Monitor daily weighs--last alb @ 1.9.  Prostat and Juven also added. - check pre-albumin  level in am. Recheck Mg level in am.  15.  Pelvic prolapse: Pessary cleaned on 06/30/23.  Last dose diflucan  07/01              Sharlet GORMAN Schmitz, PA-C 07/04/2023     I have personally performed a face to face diagnostic evaluation of this patient and formulated the key components of the plan.  Additionally, I have personally reviewed laboratory data, imaging studies, as well as relevant notes and concur with the physician assistant's documentation above.   The patient's status has not changed from the original H&P.  Any changes in documentation from the acute care chart have been noted above.

## 2023-07-04 NOTE — TOC Transition Note (Signed)
 Transition of Care (TOC) - Discharge Note Rayfield Gobble RN, BSN Transitions of Care Unit 4E- RN Case Manager See Treatment Team for direct phone #   Patient Details  Name: Katherine Archer MRN: [id number] Date of Birth: 1955-03-18  Transition of Care [facility]) CM/SW Contact:  Gobble Rayfield Hurst, RN Phone Number: 07/04/2023, 11:37 AM   Clinical Narrative:    Notified this am by CIR liaison- pt has insurance auth and bed available for admit today.  Pt cleared by medical team to transition to [facility] INPT rehab today.  Note wound VAC removed 6/30 per MD order.  [facility] liaison following w/ [facility] office protocol referral for any HH needs. Liaison- Zebedee Braver updated on dispo to CIR.   [facility] was contacted by CIR CSW prior to readmit to hospital.  Msg sent to R. Dupee CSW w/ CIR to follow up on [facility] choice should pt need services on discharge from CIR.   No further TOC needs noted at this time.    Final next level of care: IP Rehab Facility Barriers to Discharge: Barriers Resolved   Patient Goals and CMS Choice Patient states their goals for this hospitalization and ongoing recovery are:: wants to go home   Choice offered to / list presented to : Adult Children, Sibling      Discharge Placement               [facility] INPT rehab        Discharge Plan and Services Additional resources added to the After Visit Summary for     Discharge Planning Services: CM Consult Post Acute Care Choice: IP Rehab, Home Health          DME Arranged: Vac DME Agency: [facility]       HH Arranged: RN HH Agency: [employer] [facility]        Social Drivers of Health (SDOH) Interventions SDOH Screenings   Food Insecurity: No Food Insecurity (06/17/2023)  Housing: Low Risk  (06/17/2023)  Transportation Needs: No Transportation Needs (06/17/2023)  Utilities: Not At Risk (06/17/2023)  Depression (PHQ2-9): Low Risk  (06/29/2020)  Social Connections: Socially Isolated (06/17/2023)   Tobacco Use: Medium Risk (06/28/2023)     Readmission Risk Interventions    07/04/2023   11:37 AM 06/07/2023    1:15 PM 05/15/2023   11:59 AM  Readmission Risk Prevention Plan  Post Dischage Appt   Complete  Medication Screening   Complete  Transportation Screening Complete  Complete  Home Care Screening  Complete   Medication Review (RN CM)  Complete   HRI or Home Care Consult Complete    Social Work Consult for Recovery Care Planning/Counseling Complete    Palliative Care Screening Complete    Medication Review Oceanographer) Complete

## 2023-07-04 NOTE — Care Management Important Message (Signed)
 Important Message  Patient Details  Name: Katherine Archer MRN: [id number] Date of Birth: June 23, 1955   Important Message Given:  Yes - Medicare IM     Vonzell Arrie Sharps 07/04/2023, 11:07 AM

## 2023-07-04 NOTE — Progress Notes (Signed)
 Inpatient Rehabilitation Admissions Coordinator   I have insurance approval and CIR bed to admit her to today. I met with patient at bedside and spoke with her grand daughter, Melody, by phone. They are both in agreement to admit. Acute team and TOC made aware. I will make the arrangements.  Heron Leavell, RN, MSN Rehab Admissions Coordinator [phone] 07/04/2023 10:37 AM

## 2023-07-04 NOTE — Progress Notes (Signed)
 Urbano Albright, MD  Physician Physical Medicine and Rehabilitation   Consult Note     Signed   Date of Service: 06/29/2023 11:55 AM  Related encounter: Admission (Current) from 06/16/2023 in [facility] 4E CV SURGICAL PROGRESSIVE CARE   Signed     Expand All Collapse All  Show:Clear all [x] Written[x] Templated[] Copied  Added by: [x] Urbano Albright, MD  [] Hover for details          Physical Medicine and Rehabilitation Consult Reason for Consult:Rehab Referring Physician: Dr. Harden     HPI: Katherine Archer is a 68 y.o. right handed female with PMH of HTN, HLD, PAD with several previous revascularization procedures.  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 Serene.  She was treated with IV antibiotics at CK elevation at that time, Unasyn was changed to vancomycin .  Patient was admitted to CIR on 06/07/2023.  Patient was noted to have increased drainage and VAC site and was taken back to the  OR on 06/16/2023 for Right groin infection and nonhealing right above-the-knee amputation and had right groin irrigation and debridement with wound VAC placement and revision of right AKA.  On 619/25 she was taken back to the OR for left axillary to left common femoral artery bypass graft, and I&D of right AKA.  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 Harden on 06/28/2023.  Patient has been seen by PT and has functional deficits.   Patient lives in a mobile home with 5 steps to enter.  Family can assist 24/7 after discharge.     Home: Home Living Family/patient expects to be discharged to:: Private residence Living Arrangements: Children, Other relatives Available Help at Discharge: Family, Available 24 hours/day Type of Home: Mobile home Home Access: Ramped entrance, Stairs to enter (plans to have ramp installed by end of July) Entrance Stairs-Number of Steps: 5 Entrance Stairs-Rails: Right, Left, Can reach both Home Layout: One level Home Equipment:  Cane - single point, Agricultural consultant (2 wheels)  Lives With: Family  Functional History: Prior Function Prior Level of Function : Independent/Modified Independent Mobility Comments: Alternates between using SPC and RW. Reports no recent falls. ADLs Comments: Pt typically Independent with ADLs. Pt reports often taking a sponge bath due to the tub/shower bring small. Has family assist with driving Functional Status:  Mobility: Bed Mobility Overal bed mobility: Needs Assistance Bed Mobility: Supine to Sit, Sit to Supine Supine to sit: Supervision, HOB elevated, Used rails Sit to supine: Min assist General bed mobility comments: semi fowler to long sitting, sitting on EOB with LLE off of bed on floor, min assist to elevate L foot from floor>bed, pt able to bridge to scoot to center of bed   ADL:   Cognition: Cognition Orientation Level: Oriented X4 Cognition Arousal: Alert Behavior During Therapy: Anxious     Review of Systems  Constitutional:  Negative for chills and fever.  HENT:  Negative for congestion.   Eyes:  Negative for blurred vision and double vision.  Respiratory:  Negative for cough.   Cardiovascular:  Negative for chest pain.  Gastrointestinal:  Negative for abdominal pain, constipation, diarrhea, nausea and vomiting.  Genitourinary: Negative.   Musculoskeletal:  Positive for joint pain.  Skin:  Negative for rash.  Neurological:  Negative for sensory change, focal weakness and headaches.        Past Medical History:  Diagnosis Date   Cervical polyp 09/18/2018   Critical lower limb ischemia (HCC)     Encounter for  screening mammogram for breast cancer 05/05/2016   Fibrocystic breast changes of both breasts 04/29/2015   Hyperlipidemia     Hypertension     PAD (peripheral artery disease) (HCC) 08/23/2019   Pelvic prolapse 06/2020    Pelvic ring in place, needs to be changed every 3 months   Stroke [facility]) 08/23/2019             Past Surgical History:   Procedure Laterality Date   ABDOMINAL AORTOGRAM W/LOWER EXTREMITY Bilateral 08/23/2019    Procedure: ABDOMINAL AORTOGRAM W/ Bilateral LOWER EXTREMITY Runoff;  Surgeon: Harvey Carlin BRAVO, MD;  Location: [facility] INVASIVE CV LAB;  Service: Cardiovascular;  Laterality: Bilateral;   AMPUTATION Right 05/31/2023    Procedure: AMPUTATION, ABOVE KNEE SARTORIOUS MUSCLE FLAP;  Surgeon: Serene Gaile ORN, MD;  Location: [facility] OR;  Service: Vascular;  Laterality: Right;   APPLICATION OF WOUND VAC Right 05/31/2023    Procedure: APPLICATION, WOUND VAC;  Surgeon: Serene Gaile ORN, MD;  Location: [facility] OR;  Service: Vascular;  Laterality: Right;   APPLICATION OF WOUND VAC Right 06/16/2023    Procedure: APPLICATION, WOUND VAC;  Surgeon: Serene Gaile ORN, MD;  Location: [facility] OR;  Service: Vascular;  Laterality: Right;   APPLICATION OF WOUND VAC Right 06/21/2023    Procedure: APPLICATION, WOUND VAC;  Surgeon: Serene Gaile ORN, MD;  Location: [facility] OR;  Service: Vascular;  Laterality: Right;   APPLICATION, SKIN SUBSTITUTE Right 05/12/2023    Procedure: APPLICATION, SKIN SUBSTITUTE KERECIS 38SQ CM;  Surgeon: Serene Gaile ORN, MD;  Location: [facility] OR;  Service: Vascular;  Laterality: Right;   APPLICATION, SKIN SUBSTITUTE Right 06/16/2023    Procedure: APPLICATION, SKIN SUBSTITUTE;  Surgeon: Serene Gaile ORN, MD;  Location: [facility] OR;  Service: Vascular;  Laterality: Right;   APPLICATION, SKIN SUBSTITUTE Right 06/21/2023    Procedure: APPLICATION, SKIN SUBSTITUTE;  Surgeon: Serene Gaile ORN, MD;  Location: [facility] OR;  Service: Vascular;  Laterality: Right;   AVGG REMOVAL Right 05/29/2023    Procedure: REMOVAL BYPASS GRAFT RIGHT LEG;  Surgeon: Lanis Fonda BRAVO, MD;  Location: [facility] OR;  Service: Vascular;  Laterality: Right;   AXILLARY-FEMORAL BYPASS GRAFT Right 11/11/2019    Procedure: BYPASS GRAFT RIGHT AXILLA-BIFEMORAL;  Surgeon: Harvey Carlin BRAVO, MD;  Location[facility] OR;  Service: Vascular;  Laterality: Right;   AXILLARY-FEMORAL BYPASS GRAFT Left 06/21/2023     Procedure: CREATION, BYPASS, ARTERIAL, LEFT AXILLARY TO LEFT FEMORAL, USING GRAFT;  Surgeon: Serene Gaile ORN, MD;  Location: [facility] OR;  Service: Vascular;  Laterality: Left;   BRAIN SURGERY       BREAST SURGERY Bilateral 2014    1980 R breast surgery   CEREBRAL ANEURYSM REPAIR       ENDARTERECTOMY FEMORAL Bilateral 11/11/2019    Procedure: ENDARTERECTOMY FEMORAL BILATERALLY;  Surgeon: Harvey Carlin BRAVO, MD;  Location: [facility] OR;  Service: Vascular;  Laterality: Bilateral;   ENDARTERECTOMY FEMORAL Right 01/26/2022    Procedure: REDO RIGHT COMMON FEMORAL ENDARTERECTOMY USING 0.8CM X 7.6CM HEMASHIELD PLATINUM FINESSE PATCH;  Surgeon: Serene Gaile ORN, MD;  Location: [facility] OR;  Service: Vascular;  Laterality: Right;   FEMORAL-POPLITEAL BYPASS GRAFT Right 01/26/2022    Procedure: RIGHT FEMORAL-POPLITEAL BYPASS GRAFT USING GORE X 80CM PROPATEN GRAFT;  Surgeon: Serene Gaile ORN, MD;  Location: [facility] OR;  Service: Vascular;  Laterality: Right;   FEMORAL-POPLITEAL BYPASS GRAFT Right 05/12/2023    Procedure: BYPASS GRAFT FEMORAL-POPLITEAL ARTERY;  Surgeon: Serene Gaile ORN, MD;  [facility];  Service: Vascular;  Laterality: Right;  INCISION AND DRAINAGE OF WOUND Right 05/31/2023    Procedure: IRRIGATION AND DEBRIDEMENT WOUND;  Surgeon: Serene Gaile ORN, MD;  Location: [facility] OR;  Service: Vascular;  Laterality: Right;  RIGHT GROIN WASHOUT   INCISION AND DRAINAGE OF WOUND Right 06/16/2023    Procedure: IRRIGATION AND DEBRIDEMENT WOUND;  Surgeon: Serene Gaile ORN, MD;  Location: [facility] OR;  Service: Vascular;  Laterality: Right;  Right Groin   INCISION AND DRAINAGE OF WOUND Right 06/21/2023    Procedure: IRRIGATION AND DEBRIDEMENT OF RIGHT GROIN AND RIGHT ABOVE KNEE AMPUTATION;  Surgeon: Serene Gaile ORN, MD;  Location: [facility] OR;  Service: Vascular;  Laterality: Right;   IR RADIOLOGIST EVAL & MGMT   04/02/2020   IR RADIOLOGIST EVAL & MGMT   03/29/2021   REMOVAL OF GRAFT Right 06/21/2023    Procedure: REMOVAL, GRAFT, RIGHT  AXILLARY BIFEMORAL GRAFT;  Surgeon: Serene Gaile ORN, MD;  Location[facility] OR;  Service: Vascular;  Laterality: Right;   REVISION AMPUTATION, BELOW THE KNEE Right 06/16/2023    Procedure: REVISION AMPUTATION, ABOVE THE KNEE;  Surgeon: Serene Gaile ORN, MD;  Location[facility] OR;  Service: Vascular;  Laterality: Right;   THROMBECTOMY FEMORAL ARTERY Right 01/26/2022    Procedure: THROMBECTOMY RIGHT FEMORAL ARTERY;  Surgeon: Serene Gaile ORN, MD;  Location: [facility];  Service: Vascular;  Laterality: Right;   THROMBECTOMY OF BYPASS GRAFT FEMORAL- TIBIAL ARTERY Right 05/12/2023    Procedure: THROMBECTOMY, BYPASS GRAFT, ARTERIAL;  Surgeon: Serene Gaile ORN, MD;  Location: [facility] OR;  Service: Vascular;  Laterality: Right;   TUBAL LIGATION   1990             Family History  Problem Relation Age of Onset   Heart Problems Father     Heart Problems Brother     Heart Problems Brother          Social History:  reports that she quit smoking about 3 years ago. Her smoking use included cigarettes. She has never been exposed to tobacco smoke. She has never used smokeless tobacco. She reports that she does not drink alcohol  and does not use drugs. Allergies:  Allergies       Allergies  Allergen Reactions   Penicillins Other (See Comments)      Shaky and Dizziness 20 years ago            Medications Prior to Admission  Medication Sig Dispense Refill   acetaminophen  (TYLENOL ) 325 MG tablet Take 2 tablets (650 mg total) by mouth every 6 (six) hours as needed for mild pain (pain score 1-3) or moderate pain (pain score 4-6).       alendronate (FOSAMAX) 70 MG tablet Take 70 mg by mouth once a week.       amLODipine  (NORVASC ) 10 MG tablet Take 1 tablet (10 mg total) by mouth daily. 30 tablet 0   ascorbic acid  (VITAMIN C ) 500 MG tablet Take 0.5 tablets (250 mg total) by mouth 2 (two) times daily. 30 tablet 0   aspirin  EC 81 MG tablet Take 1 tablet (81 mg total) by mouth daily. Swallow whole. 30 tablet 12   ezetimibe   (ZETIA ) 10 MG tablet Take 1 tablet (10 mg total) by mouth daily. 30 tablet 0   ferrous sulfate  325 (65 FE) MG tablet Take 1 tablet (325 mg total) by mouth daily with breakfast. 30 tablet 0   gabapentin  (NEURONTIN ) 300 MG capsule Take 1 capsule (300 mg total) by mouth 2 (two) times daily. 60 capsule 0  hydrALAZINE  (APRESOLINE ) 25 MG tablet Take 1 tablet (25 mg total) by mouth every 8 (eight) hours. 90 tablet 0   hydrochlorothiazide  (HYDRODIURIL ) 12.5 MG tablet Take 1 tablet (12.5 mg total) by mouth daily. 30 tablet 0   irbesartan  (AVAPRO ) 75 MG tablet Take 1 tablet (75 mg total) by mouth daily. 30 tablet 0   oxyCODONE  (OXY IR/ROXICODONE ) 5 MG immediate release tablet Take 1-2 tablets (5-10 mg total) by mouth every 4 (four) hours as needed for severe pain (pain score 7-10). 30 tablet 0   oxyCODONE  (OXYCONTIN ) 10 mg 12 hr tablet Take 1 tablet (10 mg total) by mouth at bedtime. 14 tablet 0   polyethylene glycol powder (GLYCOLAX /MIRALAX ) 17 GM/SCOOP powder Take 1 capful (17 g) by mouth daily as needed for mild constipation. 238 g 0   vancomycin  (VANCOREADY) 1250 MG/250ML SOLN Inject 250 mLs (1,250 mg total) into the vein daily.       Zinc  Sulfate 220 (50 Zn) MG TABS Take 1 tablet (220 mg total) by mouth daily. 30 tablet 0   vancomycin  IVPB Inject 1,500 mg into the vein daily. Indication:  Bacteremia/R-groin graft infection First Dose: Yes Last Day of Therapy:  07/12/23 Labs - Sunday/Monday:  CBC/D, BMP, and vancomycin  trough. Labs - Thursday:  BMP and vancomycin  trough Labs - Once weekly: ESR and CRP Method of administration:Elastomeric Method of administration may be changed at the discretion of the patient and/or caregiver's ability to self-administer the medication ordered.       vancomycin  IVPB Inject 1,250 mg into the vein daily. Indication:  BSI First Dose: Yes Last Day of Therapy:  07/12/2023 Labs - Sunday/Monday:  CBC/D, BMP, and vancomycin  trough. Labs - Thursday:  BMP and vancomycin   trough Labs - Once weekly: ESR and CRP Method of administration:Elastomeric Method of administration may be changed at the discretion of the patient and/or caregiver's ability to self-administer the medication ordered. 30 Units 0            Blood pressure 92/64, pulse 60, temperature 98.1 F (36.7 C), temperature source Oral, resp. rate 19, height (P) 5' 6 (1.676 m), weight (P) 60.3 kg, SpO2 100%. Physical Exam   General: No apparent distress, sitting in bedside chair HEENT: Head is normocephalic, atraumatic, sclera anicteric, oral mucosa pink and moist Neck: Supple without JVD or lymphadenopathy Heart: Reg rate and rhythm. No murmurs rubs or gallops Chest: CTA bilaterally without wheezes, rales, or rhonchi; no distress Abdomen: Soft, non-tender, non-distended, bowel sounds positive. Extremities: Right hip disarticulation site with wound VAC in place Psych: Pt's affect is appropriate. Pt is cooperative Skin: Callus on plantar L foot Neuro:  AAOx3, normal insight and awareness, memory appears to be grossly intact, no speech or language deficits noted-she is primarily Spanish-speaking but knows some [place].  Cranial nerves II through XII intact   MOTOR: RUE: 5/5 Deltoid, 5/5 Biceps, 5/5 Triceps,5/5 Grip LUE: 5/5 Deltoid, 5/5 Biceps, 5/5 Triceps, 5/5 Grip   LLE: HF 4-/5, KE 4/5, ADF 4-/5, APF 4/5     SENSORY: Intact to light touch in all 4 extremities   Coordination: No ataxia or dysmetria noted   No hypertonia noted     Lab Results Last 24 Hours        Results for orders placed or performed during the hospital encounter of 06/16/23 (from the past 24 hours)  Basic metabolic panel with GFR     Status: Abnormal    Collection Time: 06/29/23  5:07 AM  Result Value Ref Range  Sodium 132 (L) 135 - 145 mmol/L    Potassium 4.4 3.5 - 5.1 mmol/L    Chloride 98 98 - 111 mmol/L    CO2 25 22 - 32 mmol/L    Glucose, Bld 114 (H) 70 - 99 mg/dL    BUN 8 8 - 23 mg/dL    Creatinine,  Ser 9.50 0.44 - 1.00 mg/dL    Calcium  9.1 8.9 - 10.3 mg/dL    GFR, Estimated >39 >39 mL/min    Anion gap 9 5 - 15  CBC with Differential/Platelet     Status: Abnormal    Collection Time: 06/29/23  5:07 AM  Result Value Ref Range    WBC 21.6 (H) 4.0 - 10.5 K/uL    RBC 2.46 (L) 3.87 - 5.11 MIL/uL    Hemoglobin 7.2 (L) 12.0 - 15.0 g/dL    HCT 77.0 (L) 63.9 - 46.0 %    MCV 93.1 80.0 - 100.0 fL    MCH 29.3 26.0 - 34.0 pg    MCHC 31.4 30.0 - 36.0 g/dL    RDW 84.6 88.4 - 84.4 %    Platelets 369 150 - 400 K/uL    nRBC 0.0 0.0 - 0.2 %    Neutrophils Relative % 80 %    Neutro Abs 17.4 (H) 1.7 - 7.7 K/uL    Lymphocytes Relative 11 %    Lymphs Abs 2.3 0.7 - 4.0 K/uL    Monocytes Relative 7 %    Monocytes Absolute 1.5 (H) 0.1 - 1.0 K/uL    Eosinophils Relative 0 %    Eosinophils Absolute 0.0 0.0 - 0.5 K/uL    Basophils Relative 0 %    Basophils Absolute 0.0 0.0 - 0.1 K/uL    Immature Granulocytes 2 %    Abs Immature Granulocytes 0.44 (H) 0.00 - 0.07 K/uL  Prepare RBC (crossmatch)     Status: None    Collection Time: 06/29/23  8:30 AM  Result Value Ref Range    Order Confirmation          ORDER PROCESSED BY BLOOD BANK Performed at [facility], [address], [place], [place] [address]    Hemoglobin and hematocrit, blood     Status: Abnormal    Collection Time: 06/29/23  9:47 AM  Result Value Ref Range    Hemoglobin 8.3 (L) 12.0 - 15.0 g/dL    HCT 73.7 (L) 63.9 - 46.0 %  Protime-INR     Status: None    Collection Time: 06/29/23  9:47 AM  Result Value Ref Range    Prothrombin Time 15.1 11.4 - 15.2 seconds    INR 1.1 0.8 - 1.2  Fibrinogen      Status: Abnormal    Collection Time: 06/29/23  9:47 AM  Result Value Ref Range    Fibrinogen  646 (H) 210 - 475 mg/dL       Imaging Results (Last 48 hours)  DG Abd Portable 1V Result Date: 06/28/2023 CLINICAL DATA:  Abdominal pain and distention. EXAM: PORTABLE ABDOMEN - 1 VIEW COMPARISON:  Aorto bi-iliac CTA dated 05/29/2023.  FINDINGS: Interval amputation of the right leg with no bone destruction or periosteal reaction involving the pelvic bones. No soft tissue gas. Right pelvic skin clips. Normal bowel-gas pattern. Right lower skin clips. Pessary. IMPRESSION: 1. Interval amputation of the right leg. 2. No acute abnormality. Electronically Signed   By: Elspeth Bathe M.D.   On: 06/28/2023 15:28       Assessment/Plan: Diagnosis: Right  hip disarticulation Does the need for close, 24 hr/day medical supervision in concert with the patient's rehab needs make it unreasonable for this patient to be served in a less intensive setting? Yes Co-Morbidities requiring supervision/potential complications:  -HTN, HLD, ABLA, constipation, Hyponatremia, Leukocytosis Due to bladder management, bowel management, safety, skin/wound care, disease management, medication administration, pain management, and patient education, does the patient require 24 hr/day rehab nursing? Yes Does the patient require coordinated care of a physician, rehab nurse, therapy disciplines of PT/OT to address physical and functional deficits in the context of the above medical diagnosis(es)? Yes Addressing deficits in the following areas: balance, endurance, locomotion, strength, transferring, bowel/bladder control, bathing, dressing, feeding, grooming, toileting, and psychosocial support Can the patient actively participate in an intensive therapy program of at least 3 hrs of therapy per day at least 5 days per week? Yes The potential for patient to make measurable gains while on inpatient rehab is excellent Anticipated functional outcomes upon discharge from inpatient rehab are supervision  with PT, supervision with OT, n/a with SLP. WC level Estimated rehab length of stay to reach the above functional goals is: 7-10 Anticipated discharge destination: Home Overall Rehab/Functional Prognosis: excellent   POST ACUTE RECOMMENDATIONS: This patient's condition is  appropriate for continued rehabilitative care in the following setting: CIR Patient has agreed to participate in recommended program. Yes Note that insurance prior authorization may be required for reimbursement for recommended care.   Comment: I think she needed candidate for CIR, rehab coordinator to follow-up     I have personally performed a face to face diagnostic evaluation of this patient. Additionally, I have examined the patient's medical record including any pertinent labs and radiographic images.     Thanks,   Murray Collier, MD 06/29/2023           Routing History

## 2023-07-04 NOTE — Progress Notes (Signed)
 No flush back from the PICC line this morning. Called the Phlebotomist and talked to [facility]  to come back and draw the lab. She acknowledged they will come soon to draw.

## 2023-07-05 LAB — COMPREHENSIVE METABOLIC PANEL WITH GFR
ALT: 51 U/L — ABNORMAL HIGH (ref 0–44)
AST: 31 U/L (ref 15–41)
Albumin: 1.9 g/dL — ABNORMAL LOW (ref 3.5–5.0)
Alkaline Phosphatase: 85 U/L (ref 38–126)
Anion gap: 10 (ref 5–15)
BUN: 9 mg/dL (ref 8–23)
CO2: 25 mmol/L (ref 22–32)
Calcium: 9.2 mg/dL (ref 8.9–10.3)
Chloride: 98 mmol/L (ref 98–111)
Creatinine, Ser: 0.57 mg/dL (ref 0.44–1.00)
GFR, Estimated: >60 mL/min (ref >60)
Glucose, Bld: 114 mg/dL — ABNORMAL HIGH (ref 70–99)
Potassium: 4.4 mmol/L (ref 3.5–5.1)
Sodium: 133 mmol/L — ABNORMAL LOW (ref 135–145)
Total Bilirubin: 0.6 mg/dL (ref 0.0–1.2)
Total Protein: 6.7 g/dL (ref 6.5–8.1)

## 2023-07-05 LAB — VITAMIN B12: Vitamin B-12: 786 pg/mL (ref 180–914)

## 2023-07-05 LAB — RETICULOCYTES
Immature Retic Fract: 22 % — ABNORMAL HIGH (ref 2.3–15.9)
RBC.: 3.22 MIL/uL — ABNORMAL LOW (ref 3.87–5.11)
Retic Count, Absolute: 64.7 10*3/uL (ref 19.0–186.0)
Retic Ct Pct: 2 % (ref 0.4–3.1)

## 2023-07-05 LAB — CBC WITH DIFFERENTIAL/PLATELET
Abs Immature Granulocytes: 0.12 10*3/uL — ABNORMAL HIGH (ref 0.00–0.07)
Basophils Absolute: 0 10*3/uL (ref 0.0–0.1)
Basophils Relative: 0 %
Eosinophils Absolute: 0.1 10*3/uL (ref 0.0–0.5)
Eosinophils Relative: 1 %
HCT: 28.9 % — ABNORMAL LOW (ref 36.0–46.0)
Hemoglobin: 9.4 g/dL — ABNORMAL LOW (ref 12.0–15.0)
Immature Granulocytes: 1 %
Lymphocytes Relative: 19 %
Lymphs Abs: 1.9 10*3/uL (ref 0.7–4.0)
MCH: 29.4 pg (ref 26.0–34.0)
MCHC: 32.5 g/dL (ref 30.0–36.0)
MCV: 90.3 fL (ref 80.0–100.0)
Monocytes Absolute: 0.6 10*3/uL (ref 0.1–1.0)
Monocytes Relative: 6 %
Neutro Abs: 7.4 10*3/uL (ref 1.7–7.7)
Neutrophils Relative %: 73 %
Platelets: 328 10*3/uL (ref 150–400)
RBC: 3.2 MIL/uL — ABNORMAL LOW (ref 3.87–5.11)
RDW: 15.1 % (ref 11.5–15.5)
WBC: 10 10*3/uL (ref 4.0–10.5)
nRBC: 0 % (ref 0.0–0.2)

## 2023-07-05 LAB — CK: Total CK: 48 U/L (ref 38–234)

## 2023-07-05 LAB — IRON AND TIBC
Iron: 19 ug/dL — ABNORMAL LOW (ref 28–170)
Saturation Ratios: 11 % (ref 10.4–31.8)
TIBC: 175 ug/dL — ABNORMAL LOW (ref 250–450)
UIBC: 156 ug/dL

## 2023-07-05 LAB — FERRITIN: Ferritin: 1057 ng/mL — ABNORMAL HIGH (ref 11–307)

## 2023-07-05 LAB — PREALBUMIN: Prealbumin: 8 mg/dL — ABNORMAL LOW (ref 18–38)

## 2023-07-05 LAB — MAGNESIUM: Magnesium: 2 mg/dL (ref 1.7–2.4)

## 2023-07-05 LAB — GLUCOSE, CAPILLARY: Glucose-Capillary: 102 mg/dL — ABNORMAL HIGH (ref 70–99)

## 2023-07-05 LAB — FOLATE: Folate: 29.5 ng/mL (ref >5.9)

## 2023-07-05 MED ORDER — ACETAMINOPHEN 325 MG PO TABS
325.0000 mg | ORAL_TABLET | ORAL | Status: DC | PRN
  Administered 2023-07-06 – 2023-07-10 (×6): 325 mg via ORAL
  Filled 2023-07-05 (×6): qty 1

## 2023-07-05 MED ORDER — EZETIMIBE 10 MG PO TABS
10.0000 mg | ORAL_TABLET | Freq: Every day | ORAL | Status: DC
  Administered 2023-07-05 – 2023-07-13 (×9): 10 mg via ORAL
  Filled 2023-07-05 (×9): qty 1

## 2023-07-05 NOTE — Progress Notes (Signed)
 Inpatient Rehabilitation  Patient information reviewed and entered into eRehab system by Cheri Rous, OTR/L, Rehab Quality Coordinator.   Information including medical coding, functional ability and quality indicators will be reviewed and updated through discharge.

## 2023-07-05 NOTE — Discharge Instructions (Addendum)
 Inpatient Rehab Discharge Instructions  Samarra Ridgely Discharge date and time:  07/13/23  Activities/Precautions/ Functional Status: Activity: no lifting, driving, or strenuous exercise till cleared by MD Diet: regular diet Wound Care:    Functional status:  ___ No restrictions     ___ Walk up steps independently _X__ 24/7 supervision/assistance   ___ Walk up steps with assistance ___ Intermittent supervision/assistance  ___ Bathe/dress independently ___ Walk with walker     _X__ Bathe/dress with assistance ___ Walk Independently    ___ Shower independently ___ Walk with assistance    ___ Shower with assistance _X__ No alcohol      ___ Return to work/school ________  Special Instructions: Perform foley care twice a day and after BM.  2. Drink plenty of water.   COMMUNITY REFERRALS UPON DISCHARGE:    Home Health:   PT   OT     RN                 Agency:[facility] Phone:[phone]     Medical Equipment/Items Ordered:[facility] BED                                                 Agency/Supplier:[facility]   [id number] [facility] FOR WOUND VAC-   My questions have been answered and I understand these instructions. I will adhere to these goals and the provided educational materials after my discharge from the hospital.  Patient/Caregiver Signature _______________________________ Date __________  Clinician Signature _______________________________________ Date __________  Please bring this form and your medication list with you to all your follow-up doctor's appointments.

## 2023-07-05 NOTE — Consult Note (Addendum)
 WOC Nurse Consult Note: Reason for Consult:Right hip disarticulation site.  Request to apply NPWT by Dr Harden.  Photo in chart. Requesting input on keeping dressing dry if foley is removed.  Barrier rings will promote a better seal.   Wound type:surgical Pressure Injury POA: NA Measurement: Right trunk:  16 cm proximal to distal, suture line  3 O'clock 9 cm suture line 9 o'clock -5 cm suture line staple line proximal to surgical site.  Wound azi:pwujru.  Some edges are not approximated, and are clean and pink, see photo  Drainage (amount, consistency, odor) moderate serosanguinous reported, none noted today.  Periwound: intact  suture line at 9 o'clock ends 2 cm from labia.  Barrier ring placed at this site to promote seal and at the distal end as well.  If foley is removed and area stays moist, VAC dressing may not be appropriate.   Dressing procedure/placement/frequency: cleanse wound with NS and pat dry. Apply barrier ring to edges as needed to promote seal.  3 pieces black foam applied along suture line.  Covered with drape and Trac pad. Seal achieved at 125 mmhg,  Supplies at bedside for next VAC change. Change twice weekly.  Will change again 7/4 and then Monday/Thursday going forward.  Will follow.  Darice Cooley MSN, RN, FNP-BC CWON Wound, Ostomy, Continence Nurse Outpatient [facility] [phone] Pager [phone]

## 2023-07-05 NOTE — Plan of Care (Signed)
  Problem: Consults Goal: RH LIMB LOSS PATIENT EDUCATION Description: Description: See Patient Education module for eduction specifics. Outcome: Progressing   Problem: RH SKIN INTEGRITY Goal: RH STG SKIN FREE OF INFECTION/BREAKDOWN Description: Manage skin w min assist Outcome: Progressing   Problem: RH SAFETY Goal: RH STG ADHERE TO SAFETY PRECAUTIONS W/ASSISTANCE/DEVICE Description: STG Adhere to Safety Precautions With cues  Assistance/Device. Outcome: Progressing

## 2023-07-05 NOTE — Progress Notes (Signed)
 Inpatient Rehabilitation Center Individual Statement of Services  Patient Name:  Katherine Archer  Date:  07/05/2023  Welcome to the Inpatient Rehabilitation Center.  Our goal is to provide you with an individualized program based on your diagnosis and situation, designed to meet your specific needs.  With this comprehensive rehabilitation program, you will be expected to participate in at least 3 hours of rehabilitation therapies Monday-Friday, with modified therapy programming on the weekends.  Your rehabilitation program will include the following services:  Physical Therapy (PT), Occupational Therapy (OT), 24 hour per day rehabilitation nursing, Therapeutic Recreaction (TR), Neuropsychology, Care Coordinator, Rehabilitation Medicine, Nutrition Services, and Pharmacy Services  Weekly team conferences will be held on Tuesday to discuss your progress.  Your Inpatient Rehabilitation Care Coordinator will talk with you frequently to get your input and to update you on team discussions.  Team conferences with you and your family in attendance may also be held.  Expected length of stay: 7-10 days  Overall anticipated outcome: independent with device  Depending on your progress and recovery, your program may change. Your Inpatient Rehabilitation Care Coordinator will coordinate services and will keep you informed of any changes. Your Inpatient Rehabilitation Care Coordinator's name and contact numbers are listed  below.  The following services may also be recommended but are not provided by the Inpatient Rehabilitation Center:   Home Health Rehabiltiation Services Outpatient Rehabilitation Services    Arrangements will be made to provide these services after discharge if needed.  Arrangements include referral to agencies that provide these services.  Your insurance has been verified to be:  [employer] Your primary doctor is:  Eric Georgi  Pertinent information will be shared with your doctor  and your insurance company.  Inpatient Rehabilitation Care Coordinator:  Rhoda Clement, KEN [phone] or ELIGAH BASQUES  Information discussed with and copy given to patient by: Clement Asberry MATSU, 07/05/2023, 8:59 AM

## 2023-07-05 NOTE — Progress Notes (Signed)
 Physical Therapy Assessment and Plan  Patient Details  Name: Katherine Archer MRN: [id number] Date of Birth: 1955/06/02  PT Diagnosis: Abnormal posture, Abnormality of gait, Difficulty walking, Muscle weakness, and Pain in joint Rehab Potential: Good ELOS: 7-10 days   Today's Date: 07/05/2023 PT Individual Time: 830-945      Hospital Problem: Principal Problem:   History of disarticulation of right hip   Past Medical History:  Past Medical History:  Diagnosis Date   Cervical polyp 09/18/2018   Critical lower limb ischemia (HCC)    Encounter for screening mammogram for breast cancer 05/05/2016   Fibrocystic breast changes of both breasts 04/29/2015   Hyperlipidemia    Hypertension    PAD (peripheral artery disease) (HCC) 08/23/2019   Pelvic prolapse 06/2020   Pelvic ring in place, needs to be changed every 3 months   Stroke (HCC) 08/23/2019   Past Surgical History:  Past Surgical History:  Procedure Laterality Date   ABDOMINAL AORTOGRAM W/LOWER EXTREMITY Bilateral 08/23/2019   Procedure: ABDOMINAL AORTOGRAM W/ Bilateral LOWER EXTREMITY Runoff;  Surgeon: Harvey Carlin BRAVO, MD;  Location: [facility];  Service: Cardiovascular;  Laterality: Bilateral;   AMPUTATION Right 05/31/2023   Procedure: AMPUTATION, ABOVE KNEE SARTORIOUS MUSCLE FLAP;  Surgeon: Serene Gaile ORN, MD;  Location: [facility] OR;  Service: Vascular;  Laterality: Right;   APPLICATION OF WOUND VAC Right 05/31/2023   Procedure: APPLICATION, WOUND VAC;  Surgeon: Serene Gaile ORN, MD;  Location: [facility] OR;  Service: Vascular;  Laterality: Right;   APPLICATION OF WOUND VAC Right 06/16/2023   Procedure: APPLICATION, WOUND VAC;  Surgeon: Serene Gaile ORN, MD;  Location: [facility] OR;  Service: Vascular;  Laterality: Right;   APPLICATION OF WOUND VAC Right 06/21/2023   Procedure: APPLICATION, WOUND VAC;  Surgeon: Serene Gaile ORN, MD;  Location: [facility] OR;  Service: Vascular;  Laterality: Right;   APPLICATION, SKIN SUBSTITUTE Right 05/12/2023    Procedure: APPLICATION, SKIN SUBSTITUTE KERECIS 38SQ CM;  Surgeon: Serene Gaile ORN, MD;  Location: [facility];  Service: Vascular;  Laterality: Right;   APPLICATION, SKIN SUBSTITUTE Right 06/16/2023   Procedure: APPLICATION, SKIN SUBSTITUTE;  Surgeon: Serene Gaile ORN, MD;  Location: [facility];  Service: Vascular;  Laterality: Right;   APPLICATION, SKIN SUBSTITUTE Right 06/21/2023   Procedure: APPLICATION, SKIN SUBSTITUTE;  Surgeon: Serene Gaile ORN, MD;  Location: [facility];  Service: Vascular;  Laterality: Right;   AVGG REMOVAL Right 05/29/2023   Procedure: REMOVAL BYPASS GRAFT RIGHT LEG;  Surgeon: Lanis Fonda BRAVO, MD;  Location: [facility] OR;  Service: Vascular;  Laterality: Right;   AXILLARY-FEMORAL BYPASS GRAFT Right 11/11/2019   Procedure: BYPASS GRAFT RIGHT AXILLA-BIFEMORAL;  Surgeon: Harvey Carlin BRAVO, MD;  [facility]: [facility];  Service: Vascular;  Laterality: Right;   AXILLARY-FEMORAL BYPASS GRAFT Left 06/21/2023   Procedure: CREATION, BYPASS, ARTERIAL, LEFT AXILLARY TO LEFT FEMORAL, USING GRAFT;  Surgeon: Serene Gaile ORN, MD;  [facility]: [facility];  Service: Vascular;  Laterality: Left;   BRAIN SURGERY     BREAST SURGERY Bilateral 2014   1980 R breast surgery   CEREBRAL ANEURYSM REPAIR     ENDARTERECTOMY FEMORAL Bilateral 11/11/2019   Procedure: ENDARTERECTOMY FEMORAL BILATERALLY;  Surgeon: Harvey Carlin BRAVO, MD;  Location: [facility] OR;  Service: Vascular;  Laterality: Bilateral;   ENDARTERECTOMY FEMORAL Right 01/26/2022   Procedure: REDO RIGHT COMMON FEMORAL ENDARTERECTOMY USING 0.8CM X 7.6CM HEMASHIELD PLATINUM FINESSE PATCH;  Surgeon: Serene Gaile ORN, MD;  [facility];  Service: Vascular;  Laterality: Right;   FEMORAL-POPLITEAL BYPASS  GRAFT Right 01/26/2022   Procedure: RIGHT FEMORAL-POPLITEAL BYPASS GRAFT USING GORE X 80CM PROPATEN GRAFT;  Surgeon: Serene Gaile ORN, MD;  [facility];  Service: Vascular;  Laterality: Right;   FEMORAL-POPLITEAL BYPASS GRAFT Right 05/12/2023   Procedure: BYPASS GRAFT  FEMORAL-POPLITEAL ARTERY;  Surgeon: Serene Gaile ORN, MD;  Location: [facility] OR;  Service: Vascular;  Laterality: Right;   HIP DISARTICULATION Right 06/28/2023   Procedure: MARGA, HIP;  Surgeon: Harden Jerona GAILS, MD;  Location: [facility] OR;  Service: Orthopedics;  Laterality: Right;   INCISION AND DRAINAGE OF WOUND Right 05/31/2023   Procedure: IRRIGATION AND DEBRIDEMENT WOUND;  Surgeon: Serene Gaile ORN, MD;  [facility];  Service: Vascular;  Laterality: Right;  RIGHT GROIN WASHOUT   INCISION AND DRAINAGE OF WOUND Right 06/16/2023   Procedure: IRRIGATION AND DEBRIDEMENT WOUND;  Surgeon: Serene Gaile ORN, MD;  [facility]: [facility];  Service: Vascular;  Laterality: Right;  Right Groin   INCISION AND DRAINAGE OF WOUND Right 06/21/2023   Procedure: IRRIGATION AND DEBRIDEMENT OF RIGHT GROIN AND RIGHT ABOVE KNEE AMPUTATION;  Surgeon: Serene Gaile ORN, MD;  [facility]: [facility];  Service: Vascular;  Laterality: Right;   IR RADIOLOGIST EVAL & MGMT  04/02/2020   IR RADIOLOGIST EVAL & MGMT  03/29/2021   REMOVAL OF GRAFT Right 06/21/2023   Procedure: REMOVAL, GRAFT, RIGHT AXILLARY BIFEMORAL GRAFT;  Surgeon: Serene Gaile ORN, MD;  [facility]: [facility];  Service: Vascular;  Laterality: Right;   REVISION AMPUTATION, BELOW THE KNEE Right 06/16/2023   Procedure: REVISION AMPUTATION, ABOVE THE KNEE;  Surgeon: Serene Gaile ORN, MD;  [facility]: [facility];  Service: Vascular;  Laterality: Right;   THROMBECTOMY FEMORAL ARTERY Right 01/26/2022   Procedure: THROMBECTOMY RIGHT FEMORAL ARTERY;  Surgeon: Serene Gaile ORN, MD;  [facility]: [facility];  Service: Vascular;  Laterality: Right;   THROMBECTOMY OF BYPASS GRAFT FEMORAL- TIBIAL ARTERY Right 05/12/2023   Procedure: THROMBECTOMY, BYPASS GRAFT, ARTERIAL;  Surgeon: Serene Gaile ORN, MD;  Location: [facility];  Service: Vascular;  Laterality: Right;   TUBAL LIGATION  1990    Assessment & Plan Clinical Impression: Patient is a 68 y.o. R handed female with history of HTN, stroke, pelvic prolapse (tx  w/pessary) PAD with multiple procedures-->right axillary bifemoral BG 11/21, prior tobacco use (quit a year ago?) critical limb ischemia s/p thrombectomy with 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 Serene with ID recommending IV antibiotics followed by lifelong suppression with doxycycline. She was admitted for intensive rehab program on 06/07/23 with wound VAC to right groin.   Patient currently requires min with mobility secondary to muscle weakness and decreased postural control and decreased balance strategies.  Prior to hospitalization, patient was independent  with mobility and lived with Family in a Mobile home home.  Home access is 5Ramped entrance.  Patient will benefit from skilled PT intervention to maximize safe functional mobility, minimize fall risk, and decrease caregiver burden for planned discharge home with 24 hour supervision.  Anticipate patient will benefit from follow up HH at discharge.  PT - End of Session Activity Tolerance: Tolerates 30+ min activity with multiple rests Endurance Deficit: Yes PT Assessment Rehab Potential (ACUTE/IP ONLY): Good PT Barriers to Discharge: Wound Care;Home  environment access/layout PT Barriers to Discharge Comments: wound vac right hip PT Patient demonstrates impairments in the following area(s): Balance;Behavior;Endurance;Motor;Pain;Safety;Skin Integrity PT Transfers Functional Problem(s): Bed Mobility;Car;Bed to Chair PT Locomotion Functional Problem(s): Wheelchair Mobility;Ambulation;Stairs PT Plan PT Intensity: Minimum of 1-2 x/day ,45 to 90 minutes PT Frequency: 5 out of 7 days PT Duration Estimated Length of  Stay: 7-10 days PT Treatment/Interventions: Ambulation/gait training;Community reintegration;DME/adaptive equipment instruction;Neuromuscular re-education;Psychosocial support;Stair training;UE/LE Strength taining/ROM;Wheelchair propulsion/positioning;Balance/vestibular training;Discharge planning;Pain management;Skin care/wound management;Therapeutic Activities;UE/LE Coordination activities;Cognitive remediation/compensation;Disease management/prevention;Functional mobility training;Patient/family education;Splinting/orthotics;Therapeutic Exercise;Visual/perceptual remediation/compensation PT Transfers Anticipated Outcome(s): mod i PT Locomotion Anticipated Outcome(s): supervision PT Recommendation Recommendations for Other Services: Therapeutic Recreation consult Therapeutic Recreation Interventions: Outing/community reintergration Follow Up Recommendations: Home health PT;Outpatient PT Patient destination: Home Equipment Recommended: To be determined Equipment Details: pt has RW, pt needs WC   PT Evaluation Precautions/Restrictions Precautions Precautions: Fall Recall of Precautions/Restrictions: Intact Precaution/Restrictions Comments: Contact precs;watch BP, wound vac right hip Restrictions Weight Bearing Restrictions Per Provider Order: No RLE Weight Bearing Per Provider Order:  (RLE AKA) Other Position/Activity Restrictions: Rt hip disarticulation  Pain Pt reports 4/10 pain to right hip   Pain Interference Occasionally    Home Living/Prior Functioning Independent with mobility, ADLs   Vision/Perception  Vision - History Ability to See in Adequate Light: 0 Adequate Perception Perception: Within Functional Limits Praxis Praxis: WFL  Cognition Overall Cognitive Status: Within Functional Limits for tasks assessed Arousal/Alertness: Awake/alert Orientation Level: Oriented X4 Sensation Sensation Light Touch: Appears Intact (LLE) Central sensation comments: impaired d/t  limb loss Peripheral sensation comments: LLE Light Touch Impaired Details: Absent RLE Hot/Cold: Appears Intact Proprioception: Appears Intact Stereognosis: Not tested Coordination Gross Motor Movements are Fluid and Coordinated: No Fine Motor Movements are Fluid and Coordinated: Yes Coordination and Movement Description: Deficits due to R his disarticulation, RLE AKA. Motor  Motor Motor: Other (comment) Motor - Skilled Clinical Observations: general weakness   Trunk/Postural Assessment  Cervical Assessment Cervical Assessment: Within Functional Limits Thoracic Assessment Thoracic Assessment: Within Functional Limits Lumbar Assessment Lumbar Assessment: Within Functional Limits Postural Control Postural Control: Deficits on evaluation Righting Reactions: delayed  Balance Balance Balance Assessed: Yes Static Sitting Balance Static Sitting - Balance Support: Feet supported Static Sitting - Level of Assistance: 5: Stand by assistance Dynamic Sitting Balance Dynamic Sitting - Balance Support: During functional activity Dynamic Sitting - Level of Assistance: 5: Stand by assistance Dynamic Sitting - Balance Activities: Forward lean/weight shifting Sitting balance - Comments: supervision static sitting Static Standing Balance Static Standing - Balance Support: Bilateral upper extremity supported;During functional activity Static Standing - Level of Assistance: 5: Stand by assistance Dynamic Standing Balance Dynamic Standing - Balance Support: During functional activity;Bilateral upper extremity supported;Left upper extremity supported Dynamic Standing - Level of Assistance: 5: Stand by assistance Dynamic Standing - Balance Activities: Lateral lean/weight shifting;Reaching for objects Extremity Assessment  RUE Assessment RUE Assessment: Within Functional Limits LUE Assessment LUE Assessment: Within Functional Limits RLE Assessment RLE Assessment: Exceptions to [facility] General  Strength Comments: AKA LLE Assessment LLE Assessment: Within Functional Limits General Strength Comments: WFLs  Care Tool Care Tool Bed Mobility Roll left and right activity   Roll left and right assist level: Supervision/Verbal cueing    Sit to lying activity   Sit to lying assist level: Supervision/Verbal cueing    Lying to sitting on side of bed activity   Lying to sitting on side of bed assist level: the ability to move from lying on the back to sitting on the side of the bed with  no back support.: Supervision/Verbal cueing     Care Tool Transfers Sit to stand transfer   Sit to stand assist level: Contact Guard/Touching assist Sit to stand assistive device: Walker  Chair/bed transfer   Chair/bed transfer assist level: Pensions consultant transfer activity did not occur: Safety/medical concerns (fatigue, poor endurance)        Care Tool Locomotion Ambulation   Assist level: Supervision/Verbal cueing Assistive device: Walker-rolling Max distance: 20'  Walk 10 feet activity   Assist level: Supervision/Verbal cueing Assistive device: Walker-rolling   Walk 50 feet with 2 turns activity Walk 50 feet with 2 turns activity did not occur: Safety/medical concerns (fatigue, poor endurance)      Walk 150 feet activity Walk 150 feet activity did not occur: Safety/medical concerns (fatigue, poor endurance)      Walk 10 feet on uneven surfaces activity Walk 10 feet on uneven surfaces activity did not occur: Safety/medical concerns (fatigue, poor endurance)      Stairs Stair activity did not occur: Safety/medical concerns (fatigue, poor endurance)        Walk up/down 1 step activity Walk up/down 1 step or curb (drop down) activity did not occur: Safety/medical concerns (fatigue, poor endurance)      Walk up/down 4 steps activity Walk up/down 4 steps activity did not occur: Safety/medical concerns (fatigue, poor endurance)      Walk up/down 12  steps activity Walk up/down 12 steps activity did not occur: Safety/medical concerns (fatigue, poor endurance)      Pick up small objects from floor Pick up small object from the floor (from standing position) activity did not occur: Safety/medical concerns (fatigue, poor endurance)      Wheelchair Is the patient using a wheelchair?: Yes Type of Wheelchair: Manual   Wheelchair assist level: Dependent - Patient 0% Max wheelchair distance: 250'  Wheel 50 feet with 2 turns activity Wheelchair 50 feet with 2 turns activity did not occur: Safety/medical concerns Assist Level: Dependent - Patient 0%  Wheel 150 feet activity   Assist Level: Dependent - Patient 0%    Refer to Care Plan for Long Term Goals  SHORT TERM GOAL WEEK 1 PT Short Term Goal 1 (Week 1): STG=LTG  Recommendations for other services: Therapeutic Recreation  Outing/community reintegration  Skilled Therapeutic Intervention Mobility Bed Mobility Bed Mobility: Rolling Left;Left Sidelying to Sit;Sitting - Scoot to Edge of Bed;Sit to Sidelying Left;Supine to Sit;Sit to Supine;Scooting to [facility];Rolling Right Rolling Right: Supervision/verbal cueing Rolling Left: Supervision/Verbal cueing Left Sidelying to Sit: Supervision/Verbal cueing Supine to Sit: Supervision/Verbal cueing Sitting - Scoot to Edge of Bed: Supervision/Verbal cueing Sit to Supine: Supervision/Verbal cueing Sit to Sidelying Left: Supervision/Verbal cueing Scooting to HOB: Contact Guard/Touching assist Transfers Transfers: Sit to Stand;Stand to Sit;Stand Pivot Transfers Sit to Stand: Contact Guard/Touching assist Stand to Sit: Contact Guard/Touching assist Stand Pivot Transfers: Contact Guard/Touching assist Transfer (Assistive device): Rolling walker Locomotion  Gait Ambulation: Yes Gait Assistance: Supervision/Verbal cueing Gait Distance (Feet): 20 Feet Assistive device: Rolling walker Gait Gait: Yes Gait Pattern: Impaired Gait velocity:  decreased Stairs / Additional Locomotion Stairs: No (fatigue, poor endurance) Wheelchair Mobility Wheelchair Mobility: Yes Wheelchair Assistance: Dependent - Patient 0%   Discharge Criteria: Patient will be discharged from PT if patient refuses treatment 3 consecutive times without medical reason, if treatment goals not met, if there is a change in medical status, if patient makes no progress towards goals or if patient is discharged from hospital.  The above  assessment, treatment plan, treatment alternatives and goals were discussed and mutually agreed upon: by patient  [name] Fast 07/05/2023, 4:07 PM

## 2023-07-05 NOTE — Progress Notes (Signed)
 Patient ID: Katherine Archer, female   DOB: [birth year], 68 y.o.   MRN: [id number]  Inpatient Rehabilitation Care Coordinator Assessment and Plan Patient Details  Name: Katherine Archer MRN: [id number] Date of Birth: [birth year]   Today's Date: 06/08/2023   Hospital Problems: Principal Problem:   Right above-knee amputee [facility])   Past Medical History:      Past Medical History:  Diagnosis Date   Cervical polyp 09/18/2018   Critical lower limb ischemia (HCC)     Encounter for screening mammogram for breast cancer 05/05/2016   Fibrocystic breast changes of both breasts 04/29/2015   Hyperlipidemia     Hypertension     PAD (peripheral artery disease) (HCC) 08/23/2019   Stroke (HCC) 08/23/2019        Past Surgical History:       Past Surgical History:  Procedure Laterality Date   ABDOMINAL AORTOGRAM W/LOWER EXTREMITY Bilateral 08/23/2019    Procedure: ABDOMINAL AORTOGRAM W/ Bilateral LOWER EXTREMITY [place];  Surgeon: Harvey Carlin BRAVO, MD;  Location: [facility] INVASIVE CV LAB;  Service: Cardiovascular;  Laterality: Bilateral;   AMPUTATION Right 05/31/2023    Procedure: AMPUTATION, ABOVE KNEE SARTORIOUS MUSCLE FLAP;  Surgeon: Serene Gaile ORN, MD;  Location: [facility] OR;  Service: Vascular;  Laterality: Right;   APPLICATION OF WOUND VAC Right 05/31/2023    Procedure: APPLICATION, WOUND VAC;  Surgeon: Serene Gaile ORN, MD;  Location: [facility] OR;  Service: Vascular;  Laterality: Right;   APPLICATION, SKIN SUBSTITUTE Right 05/12/2023    Procedure: APPLICATION, SKIN SUBSTITUTE KERECIS 38SQ CM;  Surgeon: Serene Gaile ORN, MD;  Location: [facility] OR;  Service: Vascular;  Laterality: Right;   AVGG REMOVAL Right 05/29/2023    Procedure: REMOVAL BYPASS GRAFT RIGHT LEG;  Surgeon: Lanis Fonda BRAVO, MD;  Location: [facility] OR;  Service: Vascular;  Laterality: Right;   AXILLARY-FEMORAL BYPASS GRAFT Right 11/11/2019    Procedure: BYPASS GRAFT RIGHT AXILLA-BIFEMORAL;  Surgeon: Harvey Carlin BRAVO, MD;  Location: [facility] OR;  Service: Vascular;  Laterality: Right;    BRAIN SURGERY       BREAST SURGERY Bilateral 2014    1980 R breast surgery   CEREBRAL ANEURYSM REPAIR       ENDARTERECTOMY FEMORAL Bilateral 11/11/2019    Procedure: ENDARTERECTOMY FEMORAL BILATERALLY;  Surgeon: Harvey Carlin BRAVO, MD;  Location: [facility] OR;  Service: Vascular;  Laterality: Bilateral;   ENDARTERECTOMY FEMORAL Right 01/26/2022    Procedure: REDO RIGHT COMMON FEMORAL ENDARTERECTOMY USING 0.8CM X 7.6CM HEMASHIELD PLATINUM FINESSE PATCH;  Surgeon: Serene Gaile ORN, MD;  Location: [facility] OR;  Service: Vascular;  Laterality: Right;   FEMORAL-POPLITEAL BYPASS GRAFT Right 01/26/2022    Procedure: RIGHT FEMORAL-POPLITEAL BYPASS GRAFT USING GORE X 80CM PROPATEN GRAFT;  Surgeon: Serene Gaile ORN, MD;  Location: [facility] OR;  Service: Vascular;  Laterality: Right;   FEMORAL-POPLITEAL BYPASS GRAFT Right 05/12/2023    Procedure: BYPASS GRAFT FEMORAL-POPLITEAL ARTERY;  Surgeon: Serene Gaile ORN, MD;  Location: [facility] OR;  Service: Vascular;  Laterality: Right;   INCISION AND DRAINAGE OF WOUND Right 05/31/2023    Procedure: IRRIGATION AND DEBRIDEMENT WOUND;  Surgeon: Serene Gaile ORN, MD;  Location: [facility] OR;  Service: Vascular;  Laterality: Right;  RIGHT GROIN WASHOUT   IR RADIOLOGIST EVAL & MGMT   04/02/2020   IR RADIOLOGIST EVAL & MGMT   03/29/2021   THROMBECTOMY FEMORAL ARTERY Right 01/26/2022    Procedure: THROMBECTOMY RIGHT FEMORAL ARTERY;  Surgeon: Serene Gaile ORN, MD;  Location: [facility] OR;  Service: Vascular;  Laterality: Right;   THROMBECTOMY  OF BYPASS GRAFT FEMORAL- TIBIAL ARTERY Right 05/12/2023    Procedure: THROMBECTOMY, BYPASS GRAFT, ARTERIAL;  Surgeon: Serene Gaile ORN, MD;  Location: [facility] OR;  Service: Vascular;  Laterality: Right;   TUBAL LIGATION   1990        Social History:  reports that she quit smoking about 3 years ago. Her smoking use included cigarettes. She has never been exposed to tobacco smoke. She has never used smokeless tobacco. She reports that she does not drink alcohol  and does not use  drugs.   Family / Support Systems Marital Status: Divorced Patient Roles: Parent, Other (Comment) (Grandparent) Children: luz-daughter [phone]  Annie-daughter [phone] Other Supports: melody-granddaughter [phone]  Jerelene [phone] Anticipated Caregiver: melody Ability/Limitations of Caregiver: Granddaughter can provide assist according to pt Caregiver Availability: 24/7 Family Dynamics: Close knit with family and extended family, she lives with gradndaughter who will be assisting her at discharge.   Social History Preferred language: Spanish Religion: None Cultural Background: Speaks Spanish and needs an Engineer, technical sales Education: HS Health Literacy - How often do you need to have someone help you when you read instructions, pamphlets, or other written material from your doctor or pharmacy?: Never Writes: Yes Employment Status: Retired Marine scientist Issues: NA Guardian/Conservator: None-according to MD pt is capable of making her own decisions while here    Abuse/Neglect Abuse/Neglect Assessment Can Be Completed: Yes Physical Abuse: Denies Verbal Abuse: Denies Sexual Abuse: Denies Exploitation of patient/patient's resources: Denies Self-Neglect: Denies   Patient response to: Social Isolation - How often do you feel lonely or isolated from those around you?: Rarely   Emotional Status Pt's affect, behavior and adjustment status: Pt is motivated to do well and regain as much independence as she can. She is not feelng well today but is still trying to do therapies. Recent Psychosocial Issues: other health issues Psychiatric History: No history/issues may benefit from seeing neuro-psych while here Substance Abuse History: NA   Patient / Family Perceptions, Expectations & Goals Pt/Family understanding of illness & functional limitations: Pt can explain her amputation and issues surrounding it. She is trying to do well and does talk with the MD when  able. She does defer to her granddaughter to talk with the MD's due to she speaks english Premorbid pt/family roles/activities: mm, grandmother, sibling, etc Anticipated changes in roles/activities/participation: resume Pt/family expectations/goals: Pt states:  I hope to do well and recover from this.   Community Resources [employer]: None Premorbid Home Care/DME Agencies: None Transportation available at discharge: family Is the patient able to respond to transportation needs?: Yes In the past 12 months, has lack of transportation kept you from medical appointments or from getting medications?: No In the past 12 months, has lack of transportation kept you from meetings, work, or from getting things needed for daily living?: No Resource referrals recommended: Neuropsychology   Discharge Planning Living Arrangements: Children, Other relatives Support Systems: Children, Other relatives, Friends/neighbors Type of Residence: Private residence Insurance Resources: Media planner (specify) Radiographer, therapeutic) Financial Resources: Tree surgeon, Family Support Financial Screen Referred: No Living Expenses: Lives with family Money Management: Family Does the patient have any problems obtaining your medications?: No Home Management: granddaughter Patient/Family Preliminary Plans: Return home with granddaughter who can be there with her and proivide assist. Awaiting return call from granddaughter to confirm this. Aware being evaluated today and goals being set for stay here. Care Coordinator Barriers to Discharge: Insurance for SNF coverage, Weight bearing restrictions, Wound Care, IV antibiotics Care Coordinator Anticipated Follow Up  Needs: HH/OP   Clinical Impression Pleasant female who is not feeling well today but trying in therapies. Her family is involved and supportive. Awaiting granddaughter call back to confirm plan.   Katherine Archer 06/08/2023, 11:25 AM  Addendhum: Pt  has come back to CIR will follow and assist with discharge needs and add to neuro-psych list to be seen

## 2023-07-05 NOTE — Progress Notes (Signed)
 PROGRESS NOTE   Subjective/Complaints:  Pt reports pain improved with pain meds- likes the dose of meds- doesn't want to change. LBM yesterday. Doesn't need any other assistance, per pt- no other issues.   However since pt has had Foley to prevent contamination of wound, PA and wound care addressed- decided to put VAC on since copious drainage from R hip disarticulation. Concern that urine will contaminate incision- so Dr Harden approved [facility]- will change 2x/week per WOC.  Objective:   DG Chest 2 View Result Date: 07/04/2023 CLINICAL DATA:  Hypoxia EXAM: CHEST - 2 VIEW COMPARISON:  Chest x-ray 05/29/2023, one-view.  Older exams as well FINDINGS: Enlarged cardiopericardial silhouette with calcified aorta. No consolidation, pneumothorax or effusion. No edema seen on the current examination. Degenerative changes of the spine. There is a right-sided PICC with tip extending to the SVC right atrial junction. IMPRESSION: Right-sided PICC. Enlarged cardiopericardial silhouette. Calcified aorta Electronically Signed   By: Ranell Bring M.D.   On: 07/04/2023 16:25   Recent Labs    07/04/23 0737 07/05/23 0853  WBC 13.1* 10.0  HGB 9.0* 9.4*  HCT 27.7* 28.9*  PLT 325 328   Recent Labs    07/03/23 0500 07/05/23 0853  NA 130* 133*  K 4.3 4.4  CL 95* 98  CO2 27 25  GLUCOSE 106* 114*  BUN 7* 9  CREATININE 0.44 0.57  CALCIUM  8.6* 9.2    Intake/Output Summary (Last 24 hours) at 07/05/2023 1711 Last data filed at 07/05/2023 1600 Gross per 24 hour  Intake 600 ml  Output 2000 ml  Net -1400 ml        Physical Exam: Vital Signs Blood pressure (!) 135/47, pulse 72, temperature 97.9 F (36.6 C), temperature source Oral, resp. rate 17, weight 53.4 kg, SpO2 98%.   General: awake, alert, appropriate, sitting up slightly in bed; breakfast at bedside; NAD HENT: conjugate gaze; oropharynx moist CV: regular rate and rhythm; no JVD Pulmonary:  CTA B/L; no W/R/R- good air movement GI: soft, NT, ND, (+)BS- normoactive Psychiatric: appropriate Neurological: Ox3 Genitourinary:    Comments: Foley in place with medium amber urine  MSK- R hip disarticulation with dressing C/D/I before therapy Musculoskeletal:     Cervical back: Neck supple. No tenderness.     Comments: Right groin with dry padded dressing and appears to have old serosanguinous drainage. Dressing C/D/I and dated 6/30  Ue's 5-/5 B/L throughout LLE- 5-/5 in L leg- throughout RLE- Hip disarticulation- no leg at all to test     Skin:    General: Skin is warm and dry.     Comments: Incisions right and left shoulder and left groin puckered with skin glue.     Neurological:     General: No focal deficit present.     Mental Status: She is alert and oriented to person, place, and time.     Comments: Flat affect. Decreased to light touch in L foot/ankle, otherwise intact to light touch in all 3 limbs   Assessment/Plan: 1. Functional deficits which require 3+ hours per day of interdisciplinary therapy in a comprehensive inpatient rehab setting. Physiatrist is providing close team supervision and 24 hour management of  active medical problems listed below. Physiatrist and rehab team continue to assess barriers to discharge/monitor patient progress toward functional and medical goals  Care Tool:  Bathing          Body parts n/a: Right lower leg, Right upper leg   Bathing assist Assist Level: Supervision/Verbal cueing (bed level)     Upper Body Dressing/Undressing Upper body dressing   What is the patient wearing?: Pull over shirt    Upper body assist Assist Level: Set up assist    Lower Body Dressing/Undressing Lower body dressing      What is the patient wearing?: Pants     Lower body assist Assist for lower body dressing: Supervision/Verbal cueing (bed level)     Toileting Toileting    Toileting assist Assist for toileting: Contact Guard/Touching  assist     Transfers Chair/bed transfer  Transfers assist     Chair/bed transfer assist level: Contact Guard/Touching assist     Locomotion Ambulation   Ambulation assist      Assist level: Supervision/Verbal cueing Assistive device: Walker-rolling Max distance: 20'   Walk 10 feet activity   Assist     Assist level: Supervision/Verbal cueing Assistive device: Walker-rolling   Walk 50 feet activity   Assist Walk 50 feet with 2 turns activity did not occur: Safety/medical concerns (fatigue, poor endurance)         Walk 150 feet activity   Assist Walk 150 feet activity did not occur: Safety/medical concerns (fatigue, poor endurance)         Walk 10 feet on uneven surface  activity   Assist Walk 10 feet on uneven surfaces activity did not occur: Safety/medical concerns (fatigue, poor endurance)         Wheelchair     Assist Is the patient using a wheelchair?: Yes Type of Wheelchair: Manual    Wheelchair assist level: Dependent - Patient 0% Max wheelchair distance: 250'    Wheelchair 50 feet with 2 turns activity    Assist    Wheelchair 50 feet with 2 turns activity did not occur: Safety/medical concerns   Assist Level: Dependent - Patient 0%   Wheelchair 150 feet activity     Assist      Assist Level: Dependent - Patient 0%   Blood pressure (!) 135/47, pulse 72, temperature 97.9 F (36.6 C), temperature source Oral, resp. rate 17, weight 53.4 kg, SpO2 98%.  Medical Problem List and Plan: 1. 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  Antithrombotics: -DVT/anticoagulation:  Pharmaceutical: Lovenox              -antiplatelet therapy: N/A 3. Pain Management: Oxycodone  prn. D/c IV  dilaudid . Increase gabapentin  to TID             --tylenol  prn.   7/2- pain overall well controlled per pt- will con't regimen 4. Mood/Behavior/Sleep: LCSW to follow for evaluation and support.              -antipsychotic agents: N/A 5. Neuropsych/cognition: This patient appears to be capable of making decisions on her own behalf. 6. Skin/Wound Care: Routine pressure relief measures. Continue Vitamin C  and protein supplements.  --  daily monitoring of right axilla and Right groin wound  for healing.  7/2- VAC placed today to try and decrease risk of wound contamination- also significant drainage, to help with this-  7. Fluids/Electrolytes/Nutrition: Strict I/O with daily weights. Continue Ensure bid.  --Will also add juven/beneprotein to promote wound healing.  8.  MRSA bacteremia: To complete 6 week antibiotic course now on Linzolid 07/13/23 (confirmed w/ID NP that no further suppression needed).              --continue to monitor CK--last 1,132 on 06/16  7/2- will recheck CK on Monday 9.  HTN: Monitor BP TID. On amlodipine , hydralazine  TID and metoprolol --BP trending low today  7/2- BP doing better- controlled, but not really soft- con't to monitor and con't regimen             --will set parameters on meds to avoid orthostatic drop.  10. Acute blood loss anemia:  Last transfused with one unit 06/29. Hgb 6.9-->9.0 today             --check anemia panel. Iron supplement started on 06/29  7.2- Hb up to 9.4 today- in spite of drainage- con't to monitor 11.  Leucocytosis: WBC trending down from 21.6 post surgery-->13.1              --recheck white count in am.  7/2- Pt's WBC down to 10k from 13k- so doing better 12. Hypoxia: --Will check CXR to follow up on pulmonary edema/intermittent tachypnea.              --note intermittent drop to low 80's last night/this am. 13.  Hyponatremia: Recheck in am. Wt 130--133 since hip disarticulation.              --monitor for signs of overload/likely  multifactorial.  14.  Severe protein Malnutrition: Monitor daily weighs--last alb @ 1.9.  Prostat and Juven also added. - check pre-albumin  level in am. Recheck Mg level in am.  7/2- Mg 2.0- as well as Albumin  1.9- ALT also slightly elevated at 51-will reduce Tylenol  to help- to 325 mg q 4 hours prn 15.  Pelvic prolapse: Pessary cleaned on 06/30/23.  Last dose diflucan  07/01  16. Urinary retention/Foley  7/2- will try to get Foley out- was in due to retention as well as to decrease risk of wound contamination- however with new VAC, should be able to remove foley.  17. Transaminitis  7/2- ALT 51- will reduce Tylenol  amount to reduce ALT levels. Will recheck next Monday       I spent a total of  43   minutes on total care today- >50% coordination of care- due to  D/w pt, PA, and review of labs, vitals and B/B- also called WOC.     LOS: 1 days A FACE TO FACE EVALUATION WAS PERFORMED  Ger Ringenberg 07/05/2023, 5:11 PM

## 2023-07-05 NOTE — Progress Notes (Signed)
 Inpatient Rehabilitation Admission Medication Review by a Pharmacist  A complete drug regimen review was completed for this patient to identify any potential clinically significant medication issues.  High Risk Drug Classes Is patient taking? Indication by Medication  Antipsychotic Yes, as an intravenous medication Compazine- n/v   Anticoagulant Yes Lovenox- VTE ppx   Antibiotic Yes Linezolid -EOT 7/10, ID f/u 7/17   Opioid Yes Oxycontin , oxycodone  IR PRN- chronic pain/acute pain   Antiplatelet No   Hypoglycemics/insulin  No   Vasoactive Medication Yes Amlodipine , hydralazine , Toprol - HTN   Chemotherapy No   Other Yes fleet enema , PEG , bisacodyl  , Senokot-S , and - constipation Maalox- indigestion Pantoprazole - reflux  Diphenhydramine- itching  Acetaminophen - pain  Robitussin- cough   melatonin and -insomnia VitC, Trigels-F Forte  Gabapentin - neuropathy      Type of Medication Issue Identified Description of Issue Recommendation(s)  Drug Interaction(s) (clinically significant)     Duplicate Therapy     Allergy     No Medication Administration End Date     Incorrect Dose     Additional Drug Therapy Needed     Significant med changes from prior encounter (inform family/care partners about these prior to discharge). PTA meds not resumed- bASA, Zetia  10, alendronate weekly   Medications discontinued inpt- irbesartan , hydrochlorothiazide   Communicate relevant medication changes to patient/family members at discharge from CIR.   Restart or discontinue PTA meds not resumed in CIR at discharge if clinically indicated.   Other       Clinically significant medication issues were identified that warrant physician communication and completion of prescribed/recommended actions by midnight of the next day:  No  Name of provider notified for urgent issues identified:   Provider Method of Notification:     Pharmacist comments:  Per discharge summary- bASA may be resumed 7/8    Time spent performing this drug regimen review (minutes):  20  Massie Fila, PharmD Clinical Pharmacist  07/05/2023 7:53 AM

## 2023-07-05 NOTE — Evaluation (Signed)
 Occupational Therapy Assessment and Plan  Patient Details  Name: Mckayla Mulcahey MRN: [id number] Date of Birth: May 02, 1955  OT Diagnosis: muscle weakness (generalized) Rehab Potential: Rehab Potential (ACUTE ONLY): Good ELOS: 7-10 days   Today's Date: 07/05/2023 OT Individual Time: 8950-8794 OT Individual Time Calculation (min): 76 min     Hospital Problem: Principal Problem:   History of disarticulation of right hip   Past Medical History:  Past Medical History:  Diagnosis Date   Cervical polyp 09/18/2018   Critical lower limb ischemia (HCC)    Encounter for screening mammogram for breast cancer 05/05/2016   Fibrocystic breast changes of both breasts 04/29/2015   Hyperlipidemia    Hypertension    PAD (peripheral artery disease) (HCC) 08/23/2019   Pelvic prolapse 06/2020   Pelvic ring in place, needs to be changed every 3 months   Stroke (HCC) 08/23/2019   Past Surgical History:  Past Surgical History:  Procedure Laterality Date   ABDOMINAL AORTOGRAM W/LOWER EXTREMITY Bilateral 08/23/2019   Procedure: ABDOMINAL AORTOGRAM W/ Bilateral LOWER EXTREMITY [place];  Surgeon: Harvey Carlin BRAVO, MD;  Location: [facility];  Service: Cardiovascular;  Laterality: Bilateral;   AMPUTATION Right 05/31/2023   Procedure: AMPUTATION, ABOVE KNEE SARTORIOUS MUSCLE FLAP;  Surgeon: Serene Gaile ORN, MD;  Location: [facility] OR;  Service: Vascular;  Laterality: Right;   APPLICATION OF WOUND VAC Right 05/31/2023   Procedure: APPLICATION, WOUND VAC;  Surgeon: Serene Gaile ORN, MD;  Location: [facility];  Service: Vascular;  Laterality: Right;   APPLICATION OF WOUND VAC Right 06/16/2023   Procedure: APPLICATION, WOUND VAC;  Surgeon: Serene Gaile ORN, MD;  Location: [facility] OR;  Service: Vascular;  Laterality: Right;   APPLICATION OF WOUND VAC Right 06/21/2023   Procedure: APPLICATION, WOUND VAC;  Surgeon: Serene Gaile ORN, MD;  Location: [facility];  Service: Vascular;  Laterality: Right;   APPLICATION, SKIN SUBSTITUTE Right  05/12/2023   Procedure: APPLICATION, SKIN SUBSTITUTE KERECIS 38SQ CM;  Surgeon: Serene Gaile ORN, MD;  Location: [facility];  Service: Vascular;  Laterality: Right;   APPLICATION, SKIN SUBSTITUTE Right 06/16/2023   Procedure: APPLICATION, SKIN SUBSTITUTE;  Surgeon: Serene Gaile ORN, MD;  [facility]: [facility];  Service: Vascular;  Laterality: Right;   APPLICATION, SKIN SUBSTITUTE Right 06/21/2023   Procedure: APPLICATION, SKIN SUBSTITUTE;  Surgeon: Serene Gaile ORN, MD;  [facility]: [facility];  Service: Vascular;  Laterality: Right;   AVGG REMOVAL Right 05/29/2023   Procedure: REMOVAL BYPASS GRAFT RIGHT LEG;  Surgeon: Lanis Fonda BRAVO, MD;  Location: [facility] OR;  Service: Vascular;  Laterality: Right;   AXILLARY-FEMORAL BYPASS GRAFT Right 11/11/2019   Procedure: BYPASS GRAFT RIGHT AXILLA-BIFEMORAL;  Surgeon: Harvey Carlin BRAVO, MD;  [facility]: [facility];  Service: Vascular;  Laterality: Right;   AXILLARY-FEMORAL BYPASS GRAFT Left 06/21/2023   Procedure: CREATION, BYPASS, ARTERIAL, LEFT AXILLARY TO LEFT FEMORAL, USING GRAFT;  Surgeon: Serene Gaile ORN, MD;  [facility];  Service: Vascular;  Laterality: Left;   BRAIN SURGERY     BREAST SURGERY Bilateral 2014   1980 R breast surgery   CEREBRAL ANEURYSM REPAIR     ENDARTERECTOMY FEMORAL Bilateral 11/11/2019   Procedure: ENDARTERECTOMY FEMORAL BILATERALLY;  Surgeon: Harvey Carlin BRAVO, MD;  Location: [facility] OR;  Service: Vascular;  Laterality: Bilateral;   ENDARTERECTOMY FEMORAL Right 01/26/2022   Procedure: REDO RIGHT COMMON FEMORAL ENDARTERECTOMY USING 0.8CM X 7.6CM HEMASHIELD PLATINUM FINESSE PATCH;  Surgeon: Serene Gaile ORN, MD;  [facility];  Service: Vascular;  Laterality: Right;   FEMORAL-POPLITEAL BYPASS  GRAFT Right 01/26/2022   Procedure: RIGHT FEMORAL-POPLITEAL BYPASS GRAFT USING GORE X 80CM PROPATEN GRAFT;  Surgeon: Serene Gaile ORN, MD;  [facility];  Service: Vascular;  Laterality: Right;   FEMORAL-POPLITEAL BYPASS GRAFT Right 05/12/2023   Procedure: BYPASS GRAFT  FEMORAL-POPLITEAL ARTERY;  Surgeon: Serene Gaile ORN, MD;  Location: [facility] OR;  Service: Vascular;  Laterality: Right;   HIP DISARTICULATION Right 06/28/2023   Procedure: MARGA, HIP;  Surgeon: Harden Jerona GAILS, MD;  Location: [facility] OR;  Service: Orthopedics;  Laterality: Right;   INCISION AND DRAINAGE OF WOUND Right 05/31/2023   Procedure: IRRIGATION AND DEBRIDEMENT WOUND;  Surgeon: Serene Gaile ORN, MD;  [facility];  Service: Vascular;  Laterality: Right;  RIGHT GROIN WASHOUT   INCISION AND DRAINAGE OF WOUND Right 06/16/2023   Procedure: IRRIGATION AND DEBRIDEMENT WOUND;  Surgeon: Serene Gaile ORN, MD;  [facility];  Service: Vascular;  Laterality: Right;  Right Groin   INCISION AND DRAINAGE OF WOUND Right 06/21/2023   Procedure: IRRIGATION AND DEBRIDEMENT OF RIGHT GROIN AND RIGHT ABOVE KNEE AMPUTATION;  Surgeon: Serene Gaile ORN, MD;  [facility]: [facility];  Service: Vascular;  Laterality: Right;   IR RADIOLOGIST EVAL & MGMT  04/02/2020   IR RADIOLOGIST EVAL & MGMT  03/29/2021   REMOVAL OF GRAFT Right 06/21/2023   Procedure: REMOVAL, GRAFT, RIGHT AXILLARY BIFEMORAL GRAFT;  Surgeon: Serene Gaile ORN, MD;  [facility];  Service: Vascular;  Laterality: Right;   REVISION AMPUTATION, BELOW THE KNEE Right 06/16/2023   Procedure: REVISION AMPUTATION, ABOVE THE KNEE;  Surgeon: Serene Gaile ORN, MD;  [facility];  Service: Vascular;  Laterality: Right;   THROMBECTOMY FEMORAL ARTERY Right 01/26/2022   Procedure: THROMBECTOMY RIGHT FEMORAL ARTERY;  Surgeon: Serene Gaile ORN, MD;  [facility];  Service: Vascular;  Laterality: Right;   THROMBECTOMY OF BYPASS GRAFT FEMORAL- TIBIAL ARTERY Right 05/12/2023   Procedure: THROMBECTOMY, BYPASS GRAFT, ARTERIAL;  Surgeon: Serene Gaile ORN, MD;  Location: [facility];  Service: Vascular;  Laterality: Right;   TUBAL LIGATION  1990    Assessment & Plan Clinical Impression:  Sarahi Borland is a 68 year old R handed female with history of HTN, stroke, pelvic prolapse (tx  w/pessary) PAD with multiple procedures-->right axillary bifemoral BG 11/21, prior tobacco use (quit a year ago?) critical limb ischemia s/p thrombectomy with 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 Serene with ID recommending IV antibiotics followed by lifelong suppression with doxycycline.  She was admitted for intensive rehab program on 06/07/23 with wound VAC to right groin.    She continued to have issues with pain control was found to have copious drainage with necrosis of medial aspect of wound as well as ischemia with necrosis of R-AKA incision. She was discharged to acute hospital on 06/16/23 for revision of AKA and I & D of groin wound but continued to have intermittent fevers up to 102 despite IV Vanc as well as issues with pain. Palliative care consulted and patient planned to continue treatment and surgical intervention as needed. She underwent removal of right axillofemoral and femorofemoral bypass on 06/18.  Dr. Harden consulted due to wound dehiscence w/gangrenous ulceration of R-AKA and patient underwent  right hip disarticulation on 06/28/23 with placement of wound VAC.  Dr. 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    Dr. Fredirick consulted for input due on pessary as well as ongoing issues with white discharge which has been treated with diflucan .   She reported that patient with pessaries frequently have increased vaginal discharge can be malodorous and no signs of infection noted. Pessary cleaned and changed out 06/30/23 with decrease in  drainage and marked edema/swelling of right labial majora and vaginal canal felt to be related to prior surgery.  She did have recurrent drop in Hgb ot 6.9 on 06/29 and was transfused with one unit PRBC. Also has had continued rise in WBC to 14.5 with low grade fever which is trending down and intermittent tachypnea noted. Foley remains in place and wound VAC to be removed today with recommendations for dry dressing. Patient transferred to CIR on 07/04/2023 .    Patient currently requires min with basic self-care skills secondary to muscle weakness, decreased cardiorespiratoy endurance, and decreased standing balance, decreased postural control, and decreased balance strategies.  Prior to hospitalization, patient could complete ADLs with modified independent .  Patient will benefit from skilled intervention to decrease level of assist with basic self-care skills and increase independence with basic self-care skills prior to discharge home with care partner.  Anticipate patient will require 24 hour supervision and follow up home health.  OT - End of Session Activity Tolerance: Tolerates 30+ min activity with multiple rests Endurance Deficit: Yes OT Assessment Rehab Potential (ACUTE ONLY): Good OT Basic ADL's Functional Problem(s): Eating;Grooming;Bathing;Dressing;Toileting OT Transfers Functional Problem(s): Toilet OT Additional Impairment(s): None OT Plan OT Intensity: Minimum of 1-2 x/day, 45 to 90 minutes OT Frequency: 5 out of 7 days OT Duration/Estimated Length of Stay: 7-10 days OT Treatment/Interventions: Balance/vestibular training;Discharge planning;Pain management;Self Care/advanced ADL retraining;Therapeutic Activities;UE/LE Coordination activities;Cognitive remediation/compensation;Disease mangement/prevention;Functional mobility training;Patient/family education;Skin care/wound managment;Therapeutic Exercise;Community reintegration;DME/adaptive equipment instruction;Neuromuscular  re-education;Psychosocial support;Splinting/orthotics;Wheelchair propulsion/positioning;UE/LE Strength taining/ROM OT Self Feeding Anticipated Outcome(s): mod I OT Basic Self-Care Anticipated Outcome(s): mod I OT Toileting Anticipated Outcome(s): mod I OT Bathroom Transfers Anticipated Outcome(s): mod I OT Recommendation Recommendations for Other Services: Therapeutic Recreation consult Therapeutic Recreation Interventions: Pet therapy Patient destination: Home Follow Up Recommendations: Home health OT Equipment Recommended: None recommended by OT   OT Evaluation Precautions/Restrictions  Precautions Precautions: Fall Recall of Precautions/Restrictions: Intact Precaution/Restrictions Comments: Marine scientist BP Restrictions Weight Bearing Restrictions Per Provider Order: No RLE Weight Bearing Per Provider Order: Non weight bearing Other Position/Activity Restrictions: Rt hip disarticulation General Chart Reviewed: Yes Family/Caregiver Present: No Pain Pain Assessment Pain Scale: 0-10 Pain Score: 8  Pain Location: Hip Pain Orientation: Right Pain Descriptors / Indicators: Aching Pain Intervention(s): Medication (See eMAR) Home Living/Prior Functioning Home Living Family/patient expects to be discharged to:: Private residence Living Arrangements: Children, Other relatives Available Help at Discharge: Family, Available 24 hours/day Type of Home: Mobile home Home Access: Ramped entrance Entrance Stairs-Number of Steps: 5 Entrance Stairs-Rails: Right, Left, Can reach both Home Layout: One level Bathroom Shower/Tub: Sponge bathes at baseline Bathroom Toilet: Standard Bathroom Accessibility: Yes Additional Comments: Pt admitted from CIR, progressing towards Mod I at w/c level  Lives With: Family IADL History Homemaking Responsibilities: Yes Meal Prep Responsibility: Secondary Laundry Responsibility: Secondary Cleaning Responsibility: Secondary Shopping  Responsibility: No Child Care Responsibility: No Current License: No Occupation: Retired Prior Function Level of Independence:  Independent with basic ADLs, Independent with homemaking with ambulation, Independent with gait, Independent with transfers, Requires assistive device for independence  Able to Take Stairs?: Yes Driving: No Vocation: Retired Administrator, sports Baseline Vision/History: 0 No visual deficits Ability to See in Adequate Light: 0 Adequate Patient Visual Report: No change from baseline Vision Assessment?: No apparent visual deficits Perception  Perception: Within Functional Limits Praxis Praxis: WFL Cognition Cognition Overall Cognitive Status: Within Functional Limits for tasks assessed Arousal/Alertness: Awake/alert Orientation Level: Place;Person;Situation Person: Oriented Place: Oriented Situation: Oriented Memory: Appears intact Awareness: Appears intact Problem Solving: Appears intact Safety/Judgment: Appears intact Brief Interview for Mental Status (BIMS) Repetition of Three Words (First Attempt): 3 Temporal Orientation: Year: Correct Temporal Orientation: Month: Accurate within 5 days Temporal Orientation: Day: Correct Recall: Sock: Yes, no cue required Recall: Blue: Yes, no cue required Recall: Bed: Yes, no cue required BIMS Summary Score: 15 Sensation Sensation Light Touch: Impaired Detail Central sensation comments: impaired d/t limb loss Hot/Cold: Appears Intact Proprioception: Appears Intact Stereognosis: Not tested Coordination Gross Motor Movements are Fluid and Coordinated: No Fine Motor Movements are Fluid and Coordinated: Yes Coordination and Movement Description: Deficits due to R his disarticulation. Motor  Motor Motor: Other (comment) Motor - Skilled Clinical Observations: general weakness  Trunk/Postural Assessment  Cervical Assessment Cervical Assessment: Within Functional Limits Thoracic Assessment Thoracic Assessment:  Within Functional Limits Lumbar Assessment Lumbar Assessment: Within Functional Limits Postural Control Postural Control: Deficits on evaluation Righting Reactions: delayed  Balance Balance Balance Assessed: Yes Static Sitting Balance Static Sitting - Balance Support: Feet supported Static Sitting - Level of Assistance: 7: Independent Dynamic Sitting Balance Dynamic Sitting - Balance Support: During functional activity Dynamic Sitting - Level of Assistance: 6: Modified independent (Device/Increase time) Static Standing Balance Static Standing - Balance Support: Bilateral upper extremity supported;During functional activity Static Standing - Level of Assistance: 4: Min assist (CGA) Dynamic Standing Balance Dynamic Standing - Balance Support: During functional activity;Bilateral upper extremity supported Dynamic Standing - Level of Assistance: 4: Min assist (CGA) Dynamic Standing - Balance Activities: Lateral lean/weight shifting;Reaching for objects Extremity/Trunk Assessment RUE Assessment RUE Assessment: Within Functional Limits LUE Assessment LUE Assessment: Within Functional Limits  Care Tool Care Tool Self Care Eating   Eating Assist Level: Set up assist    Oral Care    Oral Care Assist Level: Set up assist    Bathing       Body parts n/a: Right lower leg;Right upper leg Assist Level: Supervision/Verbal cueing (bed level)    Upper Body Dressing(including orthotics)   What is the patient wearing?: Pull over shirt   Assist Level: Set up assist    Lower Body Dressing (excluding footwear)   What is the patient wearing?: Pants Assist for lower body dressing: Supervision/Verbal cueing (bed level)    Putting on/Taking off footwear   What is the patient wearing?: Non-skid slipper socks Assist for footwear: Supervision/Verbal cueing (bed level)       Care Tool Toileting Toileting activity   Assist for toileting: Contact Guard/Touching assist     Care Tool Bed  Mobility Roll left and right activity   Roll left and right assist level: Supervision/Verbal cueing    Sit to lying activity   Sit to lying assist level: Supervision/Verbal cueing    Lying to sitting on side of bed activity   Lying to sitting on side of bed assist level: the ability to move from lying on the back to sitting on the side of the bed with no back support.:  Supervision/Verbal cueing     Care Tool Transfers Sit to stand transfer   Sit to stand assist level: Contact Guard/Touching assist    Chair/bed transfer   Chair/bed transfer assist level: Contact Guard/Touching assist     Toilet transfer   Assist Level: Contact Guard/Touching assist     Care Tool Cognition  Expression of Ideas and Wants Expression of Ideas and Wants: 4. Without difficulty (complex and basic) - expresses complex messages without difficulty and with speech that is clear and easy to understand  Understanding Verbal and Non-Verbal Content Understanding Verbal and Non-Verbal Content: 4. Understands (complex and basic) - clear comprehension without cues or repetitions   Memory/Recall Ability Memory/Recall Ability : Current season;Location of own room;Staff names and faces;That he or she is in a hospital/hospital unit   Refer to Care Plan for Long Term Goals  SHORT TERM GOAL WEEK 1 OT Short Term Goal 1 (Week 1): STGs=LTGs due to patient's estimated length of stay.  Recommendations for other services: Therapeutic Recreation  Pet therapy   Skilled Therapeutic Intervention ADL ADL Eating: Set up Where Assessed-Eating: Bed level;Chair Grooming: Setup Where Assessed-Grooming: Bed level Upper Body Bathing: Supervision/safety Where Assessed-Upper Body Bathing: Bed level Lower Body Bathing: Supervision/safety Where Assessed-Lower Body Bathing: Bed level Upper Body Dressing: Supervision/safety Where Assessed-Upper Body Dressing: Bed level Lower Body Dressing: Supervision/safety Where Assessed-Lower  Body Dressing: Bed level Toileting: Contact guard Where Assessed-Toileting: Toilet;Bedside Commode Toilet Transfer: Furniture conservator/restorer Method: Ambulating Tub/Shower Transfer: Not assessed (N/A, pt sponge bathing at baseline) Film/video editor: Not assessed Mobility  Bed Mobility Bed Mobility: Rolling Left;Left Sidelying to Sit;Sitting - Scoot to Edge of Bed;Sit to Sidelying Left;Supine to Sit;Sit to Supine Rolling Right: Supervision/verbal cueing Rolling Left: Supervision/Verbal cueing Left Sidelying to Sit: Supervision/Verbal cueing Supine to Sit: Supervision/Verbal cueing Sitting - Scoot to Edge of Bed: Supervision/Verbal cueing Sit to Supine: Supervision/Verbal cueing Sit to Sidelying Left: Supervision/Verbal cueing Transfers Sit to Stand: Contact Guard/Touching assist Stand to Sit: Contact Guard/Touching assist  1:1 evaluation and treatment session initiated this date. OT roles, goals and purpose discussed with pt as well as therapy schedule. ADL completed this date with levels of assist listed above. OT providing skilled intervention for ADL/IADL tasks in ADL suite. OT instructing patient on finding items throughout cabinets, fridge, and pantry at various heights and reaching out of BOS up/down within precautions. Pt able to complete task at [facility] with RW when completing ADL tasks and correct angles to take when opening fridge/cabinets. OT educating patient on kitchen safety while patient completing task. Pt would benefit from skilled OT in IPR setting in order to maximize independence with ADLs upon D/C.    Discharge Criteria: Patient will be discharged from OT if patient refuses treatment 3 consecutive times without medical reason, if treatment goals not met, if there is a change in medical status, if patient makes no progress towards goals or if patient is discharged from hospital.  The above assessment, treatment plan, treatment alternatives and goals were discussed  and mutually agreed upon: by patient  Camie Hoe, OTD, OTR/L 07/05/2023, 12:44 PM

## 2023-07-05 NOTE — Plan of Care (Signed)
  Problem: RH Balance Goal: LTG Patient will maintain dynamic standing with ADLs (OT) Description: LTG:  Patient will maintain dynamic standing balance with assist during activities of daily living (OT)  Flowsheets (Taken 07/05/2023 1232) LTG: Pt will maintain dynamic standing balance during ADLs with: Independent with assistive device   Problem: RH Grooming Goal: LTG Patient will perform grooming w/assist,cues/equip (OT) Description: LTG: Patient will perform grooming with assist, with/without cues using equipment (OT) Flowsheets (Taken 07/05/2023 1232) LTG: Pt will perform grooming with assistance level of: Independent with assistive device    Problem: RH Bathing Goal: LTG Patient will bathe all body parts with assist levels (OT) Description: LTG: Patient will bathe all body parts with assist levels (OT) Flowsheets (Taken 07/05/2023 1232) LTG: Pt will perform bathing with assistance level/cueing: Independent with assistive device    Problem: RH Dressing Goal: LTG Patient will perform upper body dressing (OT) Description: LTG Patient will perform upper body dressing with assist, with/without cues (OT). Flowsheets (Taken 07/05/2023 1232) LTG: Pt will perform upper body dressing with assistance level of: Independent with assistive device Goal: LTG Patient will perform lower body dressing w/assist (OT) Description: LTG: Patient will perform lower body dressing with assist, with/without cues in positioning using equipment (OT) Flowsheets (Taken 07/05/2023 1232) LTG: Pt will perform lower body dressing with assistance level of: Independent with assistive device   Problem: RH Toileting Goal: LTG Patient will perform toileting task (3/3 steps) with assistance level (OT) Description: LTG: Patient will perform toileting task (3/3 steps) with assistance level (OT)  Flowsheets (Taken 07/05/2023 1232) LTG: Pt will perform toileting task (3/3 steps) with assistance level: Independent with assistive device    Problem: RH Toilet Transfers Goal: LTG Patient will perform toilet transfers w/assist (OT) Description: LTG: Patient will perform toilet transfers with assist, with/without cues using equipment (OT) Flowsheets (Taken 07/05/2023 1232) LTG: Pt will perform toilet transfers with assistance level of: Independent with assistive device

## 2023-07-05 NOTE — Progress Notes (Signed)
 Informed that patient has copious drainage from hip disarticulation incision requiring dressing change again after am therapy. Increased frequency to qid as anticipate drainage will increase with increase in activity and want to avoid wound/incision from getting macerated. Foley in place since pre-op and concerned that urine with contaminate wound. Will get WOC input on this as well as options to keep area dry/prevent contamination.

## 2023-07-06 LAB — BASIC METABOLIC PANEL WITH GFR
Anion gap: 10 (ref 5–15)
BUN: 13 mg/dL (ref 8–23)
CO2: 25 mmol/L (ref 22–32)
Calcium: 9 mg/dL (ref 8.9–10.3)
Chloride: 97 mmol/L — ABNORMAL LOW (ref 98–111)
Creatinine, Ser: 0.52 mg/dL (ref 0.44–1.00)
GFR, Estimated: >60 mL/min (ref >60)
Glucose, Bld: 102 mg/dL — ABNORMAL HIGH (ref 70–99)
Potassium: 3.9 mmol/L (ref 3.5–5.1)
Sodium: 132 mmol/L — ABNORMAL LOW (ref 135–145)

## 2023-07-06 LAB — CBC
HCT: 27.5 % — ABNORMAL LOW (ref 36.0–46.0)
Hemoglobin: 8.7 g/dL — ABNORMAL LOW (ref 12.0–15.0)
MCH: 28.8 pg (ref 26.0–34.0)
MCHC: 31.6 g/dL (ref 30.0–36.0)
MCV: 91.1 fL (ref 80.0–100.0)
Platelets: 286 10*3/uL (ref 150–400)
RBC: 3.02 MIL/uL — ABNORMAL LOW (ref 3.87–5.11)
RDW: 14.9 % (ref 11.5–15.5)
WBC: 7.7 10*3/uL (ref 4.0–10.5)
nRBC: 0 % (ref 0.0–0.2)

## 2023-07-06 MED ORDER — SODIUM CHLORIDE 0.9% FLUSH: 10.0000 mL | INTRAVENOUS | Status: DC | PRN

## 2023-07-06 NOTE — Progress Notes (Addendum)
 PROGRESS NOTE   Subjective/Complaints:  Pt has PICC Pt reports had pain meds this AM- looks like at midnight- but pain 7/10 this AM at 8am.   Has 50cc in VAC of sanguinous drainage.  LBM yesterday.  Has foley due to hip disarticulation /wound.   Wasn't sure if got pain meds (per chart was midnight) but didn't work this last time- usually does.  ROS:  Pt denies SOB, abd pain, CP, N/V/C/D, and vision changes  Objective:   DG Chest 2 View Result Date: 07/04/2023 CLINICAL DATA:  Hypoxia EXAM: CHEST - 2 VIEW COMPARISON:  Chest x-ray 05/29/2023, one-view.  Older exams as well FINDINGS: Enlarged cardiopericardial silhouette with calcified aorta. No consolidation, pneumothorax or effusion. No edema seen on the current examination. Degenerative changes of the spine. There is a right-sided PICC with tip extending to the SVC right atrial junction. IMPRESSION: Right-sided PICC. Enlarged cardiopericardial silhouette. Calcified aorta Electronically Signed   By: Ranell Bring M.D.   On: 07/04/2023 16:25   Recent Labs    07/05/23 0853 07/06/23 0409  WBC 10.0 7.7  HGB 9.4* 8.7*  HCT 28.9* 27.5*  PLT 328 286   Recent Labs    07/05/23 0853 07/06/23 0409  NA 133* 132*  K 4.4 3.9  CL 98 97*  CO2 25 25  GLUCOSE 114* 102*  BUN 9 13  CREATININE 0.57 0.52  CALCIUM  9.2 9.0    Intake/Output Summary (Last 24 hours) at 07/06/2023 1012 Last data filed at 07/06/2023 0800 Gross per 24 hour  Intake 840 ml  Output 525 ml  Net 315 ml        Physical Exam: Vital Signs Blood pressure (!) 133/51, pulse 71, temperature 98.2 F (36.8 C), resp. rate 19, weight 54.1 kg, SpO2 99%.     General: awake, alert, appropriate,  sitting up in bed; NAD HENT: conjugate gaze; oropharynx moist CV: regular rate and rhythm; no JVD Pulmonary: CTA B/L; no W/R/R- good air movement GI: soft, NT, ND, (+)BS Psychiatric: appropriate Neurological:  Ox3  Genitourinary:    Comments: Foley in place with medium amber urine- no change  MSK- R hip disarticulation with dressing C/D/I before therapy Musculoskeletal:  VAC in plac eon R hip disarticulation- with 50cc in VAC of sanguinous drainage.     Cervical back: Neck supple. No tenderness.     Comments: Right groin with dry padded dressing and appears to have old serosanguinous drainage. Dressing C/D/I and dated 6/30  Ue's 5-/5 B/L throughout LLE- 5-/5 in L leg- throughout RLE- Hip disarticulation- no leg at all to test     Skin:    General: Skin is warm and dry.     Comments: Incisions right and left shoulder and left groin puckered with skin glue.     Neurological:     General: No focal deficit present.     Mental Status: She is alert and oriented to person, place, and time.     Comments: Flat affect. Decreased to light touch in L foot/ankle, otherwise intact to light touch in all 3 limbs   Assessment/Plan: 1. Functional deficits which require 3+ hours per day of interdisciplinary therapy in a comprehensive inpatient  rehab setting. Physiatrist is providing close team supervision and 24 hour management of active medical problems listed below. Physiatrist and rehab team continue to assess barriers to discharge/monitor patient progress toward functional and medical goals  Care Tool:  Bathing          Body parts n/a: Right lower leg, Right upper leg   Bathing assist Assist Level: Supervision/Verbal cueing (bed level)     Upper Body Dressing/Undressing Upper body dressing   What is the patient wearing?: Pull over shirt    Upper body assist Assist Level: Set up assist    Lower Body Dressing/Undressing Lower body dressing      What is the patient wearing?: Pants     Lower body assist Assist for lower body dressing: Supervision/Verbal cueing (bed level)     Toileting Toileting    Toileting assist Assist for toileting: Contact Guard/Touching assist      Transfers Chair/bed transfer  Transfers assist     Chair/bed transfer assist level: Contact Guard/Touching assist     Locomotion Ambulation   Ambulation assist      Assist level: Supervision/Verbal cueing Assistive device: Walker-rolling Max distance: 20'   Walk 10 feet activity   Assist     Assist level: Supervision/Verbal cueing Assistive device: Walker-rolling   Walk 50 feet activity   Assist Walk 50 feet with 2 turns activity did not occur: Safety/medical concerns (fatigue, poor endurance)         Walk 150 feet activity   Assist Walk 150 feet activity did not occur: Safety/medical concerns (fatigue, poor endurance)         Walk 10 feet on uneven surface  activity   Assist Walk 10 feet on uneven surfaces activity did not occur: Safety/medical concerns (fatigue, poor endurance)         Wheelchair     Assist Is the patient using a wheelchair?: Yes Type of Wheelchair: Manual    Wheelchair assist level: Dependent - Patient 0% Max wheelchair distance: 250'    Wheelchair 50 feet with 2 turns activity    Assist    Wheelchair 50 feet with 2 turns activity did not occur: Safety/medical concerns   Assist Level: Dependent - Patient 0%   Wheelchair 150 feet activity     Assist      Assist Level: Dependent - Patient 0%   Blood pressure (!) 133/51, pulse 71, temperature 98.2 F (36.8 C), resp. rate 19, weight 54.1 kg, SpO2 99%.  Medical Problem List and Plan: 1. 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  2.  Antithrombotics: -DVT/anticoagulation:  Pharmaceutical: Lovenox              -antiplatelet therapy: N/A 3. Pain Management: Oxycodone  prn. D/c IV  dilaudid . Increase gabapentin  to TID             --tylenol  prn.   7/2- pain overall well controlled per pt- will con't regimen  7/3- pain worse this AM, but no pain meds for 8 hours-  4. Mood/Behavior/Sleep: LCSW to follow for evaluation and support.              -antipsychotic  agents: N/A 5. Neuropsych/cognition: This patient appears to be capable of making decisions on her own behalf. 6. Skin/Wound Care: Routine pressure relief measures. Continue Vitamin C  and protein supplements.  --daily monitoring of right axilla and Right groin wound  for healing.  7/2- VAC placed today to try and decrease risk of wound contamination- also significant drainage, to help with this-  7. Fluids/Electrolytes/Nutrition: Strict I/O with daily weights. Continue Ensure bid.  --Will also add juven/beneprotein to promote wound healing.  8.  MRSA bacteremia: To complete 6 week antibiotic course now on Linzolid 07/13/23 (confirmed w/ID NP that no further suppression needed).              --continue to monitor CK--last 1,132 on 06/16  7/2- will recheck CK on Monday 9.  HTN: Monitor BP TID. On amlodipine , hydralazine  TID and metoprolol --BP trending low today  7/2- BP doing better- controlled, but not really soft- con't to monitor and con't regimen             --will set parameters on meds to avoid orthostatic drop.  10. Acute blood loss anemia:  Last transfused with one unit 06/29. Hgb 6.9-->9.0 today             --check anemia panel. Iron supplement started on 06/29  7.2- Hb up to 9.4 today- in spite of drainage- con't to monitor 11.  Leucocytosis: WBC trending down from 21.6 post surgery-->13.1              --recheck white count in am.  7/2- Pt's WBC down to 10k from 13k- so doing better 12. Hypoxia: --Will check CXR to follow up on pulmonary edema/intermittent tachypnea.              --note intermittent drop to low 80's last night/this am. 13.  Hyponatremia: Recheck in am. Wt 130--133 since hip disarticulation.               --monitor for signs of overload/likely multifactorial.  14.  Severe protein Malnutrition: Monitor daily weighs--last alb @ 1.9.  Prostat and Juven also added. - check pre-albumin  level in am. Recheck Mg level in am.  7/2- Mg 2.0- as well as Albumin  1.9- ALT also slightly elevated at 51-will reduce Tylenol  to help- to 325 mg q 4 hours prn 15.  Pelvic prolapse: Pessary cleaned on 06/30/23.  Last dose diflucan  07/01  16. Urinary retention/Foley  7/3- high risk of VAC not working/keeping seal- so d/w with entire Team, will keep Foley for now - since put out 50-75cc in last 18 hours. So we decided although risk of CAUTI from foley, don't want high risk of sepsis-from bladder/bowel 17. Transaminitis  7/2- ALT 51- will reduce Tylenol  amount to reduce ALT levels. Will recheck next Monday      I spent a total of  38   minutes on total care today- >50% coordination of care- due to  D/w nursing director, PA and team about foley vs removing. Decided risks outweigh benefits.   LOS: 2 days A FACE TO FACE EVALUATION WAS PERFORMED  Katherine Archer 07/06/2023, 10:12 AM

## 2023-07-06 NOTE — Plan of Care (Signed)
  Problem: Consults Goal: RH LIMB LOSS PATIENT EDUCATION Description: Description: See Patient Education module for eduction specifics. Outcome: Progressing   Problem: RH BOWEL ELIMINATION Goal: RH STG MANAGE BOWEL WITH ASSISTANCE Description: STG Manage Bowel with toileting Assistance. 07/06/2023 0715 by Ann Axel HERO, RN Outcome: Progressing 07/06/2023 0715 by Ann Axel HERO, RN Outcome: Progressing Goal: RH STG MANAGE BOWEL W/MEDICATION W/ASSISTANCE Description: STG Manage Bowel with Medication with mod I Assistance. Outcome: Progressing   Problem: RH SKIN INTEGRITY Goal: RH STG SKIN FREE OF INFECTION/BREAKDOWN Description: Manage skin w min assist 07/06/2023 0715 by Ann Axel HERO, RN Outcome: Progressing 07/06/2023 0715 by Ann Axel HERO, RN Outcome: Progressing

## 2023-07-06 NOTE — Progress Notes (Signed)
 Physical Therapy Session Note  Patient Details  Name: Katherine Archer MRN: [id number] Date of Birth: 03-Jan-1956  Today's Date: 07/06/2023 PT Individual Time: 9054-8954 PT Individual Time Calculation (min): 60 min   Short Term Goals: Week 1:  PT Short Term Goal 1 (Week 1): STG=LTG  Skilled Therapeutic Interventions/Progress Updates:    Pt finishing with OT when PT arrived.  Pt agreeable to PT session with translator present. Pt feeling a little tired.  Pt wheeled to day gym to conserve energy. NuStep with UE/LE, seat 9, arm 11 x5' max tolerance level 1. Wheeled to ortho gym and worked on ramp transfers, uneven ground with CGA. Pt performed without resting, minimal cueing for safety on mulch.  Pt demonstrates good balance, control on even/uneven surface.  Transfers supervision from w/c<->equipment (NuStep). Pt fatigued post session, amb x10' to room with RW CGA.  Pt in w/c with all items in reach post session.   Therapy Documentation Precautions:  Precautions Precautions: Fall Recall of Precautions/Restrictions: Intact Precaution/Restrictions Comments: Contact precs;watch BP, wound vac right hip Restrictions Weight Bearing Restrictions Per Provider Order: No RLE Weight Bearing Per Provider Order:  (RLE AKA) Other Position/Activity Restrictions: Rt hip disarticulation  Pain: Pain Assessment Pain Scale: 0-10 Pain Score: 0-No pain Pain Location: Hip Pain Orientation: Right Pain Intervention(s): Medication (See eMAR) :      Therapy/Group: Individual Therapy  [name] Fast 07/06/2023, 11:29 AM

## 2023-07-06 NOTE — Plan of Care (Signed)
  Problem: RH BOWEL ELIMINATION Goal: RH STG MANAGE BOWEL WITH ASSISTANCE Description: STG Manage Bowel with toileting Assistance. Outcome: Progressing Goal: RH STG MANAGE BOWEL W/MEDICATION W/ASSISTANCE Description: STG Manage Bowel with Medication with mod I Assistance. Outcome: Progressing   Problem: RH SKIN INTEGRITY Goal: RH STG SKIN FREE OF INFECTION/BREAKDOWN Description: Manage skin w min assist Outcome: Progressing

## 2023-07-06 NOTE — Progress Notes (Signed)
 Physical Therapy Session Note  Patient Details  Name: Katherine Archer MRN: [id number] Date of Birth: October 04, 1955  Today's Date: 07/06/2023 PT Individual Time: 8867-8852 PT Individual Time Calculation (min): 15 min  and Today's Date: 07/06/2023 PT Missed Time: 75 Minutes Missed Time Reason: Patient fatigue;Patient unwilling to participate  Short Term Goals: Week 1:  PT Short Term Goal 1 (Week 1): STG=LTG  Skilled Therapeutic Interventions/Progress Updates:       Treatment Session 1  Pt supine in bed upon arrival. Pt agreeable to therapy. Pt denies any pain, premedicated. Pt reports significant fatigue from rpevious sessions. Pt requesting to stay in bed during session 2/2 fatigue.   Pt eprformed the following exercies for strengthening/activity tolerance:   1x10 single leg glute bridge with arms across chest  1x10 L LE SLR  1x30 L LE ankle pumps  Pt requesting to end session early 2/2 fatigue. Pt missed 15 min 2/2 fatigue. Will attempt to make up missed minutes as able.    Treatment Session 2   Pt asleep in bed upon arrival. Pt awoken however pt refusing therapy at this time 2/2 fatigue. Will attempt to make up missed time as able.   Therapy Documentation Precautions:  Precautions Precautions: Fall Recall of Precautions/Restrictions: Intact Precaution/Restrictions Comments: Contact precs;watch BP, wound vac right hip Restrictions Weight Bearing Restrictions Per Provider Order: No RLE Weight Bearing Per Provider Order:  (AKA/wound vac) Other Position/Activity Restrictions: Rt hip disarticulation  Therapy/Group: Individual Therapy  [facility] [place], [place], DPT  07/06/2023, 11:40 AM

## 2023-07-06 NOTE — Progress Notes (Signed)
 Met with patient with interpreter in room to review current situation, team conference and plan of care. Reviewed foley and foley care, wound vac, pain, bowel continence. Continue to follow along to provide educational needs to facilitate preparation for discharge.

## 2023-07-06 NOTE — Progress Notes (Signed)
 Occupational Therapy Session Note  Patient Details  Name: Sheral Pfahler MRN: [id number] Date of Birth: Oct 27, 1955  Today's Date: 07/06/2023 OT Individual Time: 9099-9054 OT Individual Time Calculation (min): 45 min    Short Term Goals: Week 1:  OT Short Term Goal 1 (Week 1): STGs=LTGs due to patient's estimated length of stay.  Skilled Therapeutic Interventions/Progress Updates:      Therapy Documentation Precautions:  Precautions Precautions: Fall Recall of Precautions/Restrictions: Intact Precaution/Restrictions Comments: Contact precs;watch BP, wound vac right hip Restrictions Weight Bearing Restrictions Per Provider Order: No RLE Weight Bearing Per Provider Order:  (RLE AKA) Other Position/Activity Restrictions: Rt hip disarticulation General:  Pt supine in bed upon OT arrival, agreeable to OT session. Interpreter present.   Pain: unrated pain reported in Rt hip, activity, intermittent rest breaks, distractions provided for pain management, pt reports tolerable to proceed.   ADL: OT providing skilled intervention on ADL retraining in order to increase independence with tasks and increase activity tolerance. Pt completed the following tasks at the current level of assist: Bed mobility: SBA supine><EOB from flat bed no LOB Grooming/oral hygiene: SBA standing at sink to wash face, standing unsupported with no LOB UB dressing: SBA seated EOB for donning/doffing overhead shirt with good core support LB dressing: bed level, able to single leg bridge in order to don pants, SBA overall Bathing: SBA standing at sink to sponge bathe, able to lean down to wash legs at CGA without LOB   Pt seated in W/C at end of session with W/C alarm donned, call light within reach and 4Ps assessed.      Therapy/Group: Individual Therapy  Camie Hoe, OTD, OTR/L 07/06/2023, 11:55 AM

## 2023-07-07 MED ORDER — GABAPENTIN 400 MG PO CAPS
400.0000 mg | ORAL_CAPSULE | Freq: Three times a day (TID) | ORAL | Status: DC
  Administered 2023-07-07 – 2023-07-13 (×18): 400 mg via ORAL
  Filled 2023-07-07 (×18): qty 1

## 2023-07-07 MED ORDER — OXYCODONE HCL 5 MG PO TABS
10.0000 mg | ORAL_TABLET | ORAL | Status: DC | PRN
  Administered 2023-07-07 – 2023-07-13 (×20): 10 mg via ORAL
  Filled 2023-07-07 (×20): qty 2

## 2023-07-07 MED ORDER — DOXYCYCLINE HYCLATE 100 MG PO TABS
100.0000 mg | ORAL_TABLET | Freq: Two times a day (BID) | ORAL | Status: DC
  Administered 2023-07-07 – 2023-07-13 (×13): 100 mg via ORAL
  Filled 2023-07-07 (×14): qty 1

## 2023-07-07 NOTE — Progress Notes (Signed)
 Occupational Therapy Session Note  Patient Details  Name: Katherine Archer MRN: [id number] Date of Birth: 1955-02-19  Today's Date: 07/07/2023 OT Individual Time: 8494-8466 OT Individual Time Calculation (min): 28 min   Short Term Goals: Week 1:  OT Short Term Goal 1 (Week 1): STGs=LTGs due to patient's estimated length of stay.  Skilled Therapeutic Interventions/Progress Updates:  Pt greeted resting in bed for skilled OT session with focus on general conditioning/strengthening.   Pain: Pt reported 6/10 pain R surgical site, OT offering intermediate rest breaks and positioning suggestions throughout session to address pain/fatigue and maximize participation/safety in session.   Functional Transfers: Patient declines OOB activity due to fatigue.   Therapeutic Exercise: Pt instructed in series of LLE strengthening exercises for carryover into functional transfers and standing ADL participation, details below: 2x10 single leg glute bridge  2x10 adduction (1x10 tolerated with red resistance bend with knee flexed) 2x15 ankle pumps with red resistance band  Multimodal cuing provided for correct form/posture.   Pt remained resting in bed with 4Ps assessed and immediate needs met. Pt continues to be appropriate for skilled OT intervention to promote further functional independence in ADLs/IADLs.   Therapy Documentation Precautions:  Precautions Precautions: Fall Recall of Precautions/Restrictions: Intact Precaution/Restrictions Comments: Contact precs;watch BP, wound vac right hip Restrictions Weight Bearing Restrictions Per Provider Order: No RLE Weight Bearing Per Provider Order:  (AKA/wound vac) Other Position/Activity Restrictions: Rt hip disarticulation   Therapy/Group: Individual Therapy  Nereida Habermann, OTR/L, MSOT  07/07/2023, 3:10 PM

## 2023-07-07 NOTE — Progress Notes (Signed)
 PROGRESS NOTE   Subjective/Complaints:  Pt reports to be pain is ~ 1-2/10 at rest after pain meds, however spikes to 7/10 with therapy.  Per d/w WOC, she has severe pain on posterior aspect of hip disarticulation- worse with touch   LBM yesterday  Slept well.  Feels swelling is improving Educated pt reason has Foley is to reduce chance of wound contamination  ROS:   Pt denies SOB, abd pain, CP, N/V/C/D, and vision changes   Objective:   No results found.  Recent Labs    07/05/23 0853 07/06/23 0409  WBC 10.0 7.7  HGB 9.4* 8.7*  HCT 28.9* 27.5*  PLT 328 286   Recent Labs    07/05/23 0853 07/06/23 0409  NA 133* 132*  K 4.4 3.9  CL 98 97*  CO2 25 25  GLUCOSE 114* 102*  BUN 9 13  CREATININE 0.57 0.52  CALCIUM  9.2 9.0    Intake/Output Summary (Last 24 hours) at 07/07/2023 0946 Last data filed at 07/07/2023 0700 Gross per 24 hour  Intake 600 ml  Output 2785 ml  Net -2185 ml        Physical Exam: Vital Signs Blood pressure (!) 130/55, pulse 67, temperature 98.3 F (36.8 C), temperature source Oral, resp. rate 17, weight 53.7 kg, SpO2 100%.      General: awake, alert, appropriate, using interpretor to talk with pt; supine in bed; NAD HENT: conjugate gaze; oropharynx moist CV: regular rate and rhythm; no JVD Pulmonary: CTA B/L; no W/R/R- good air movement GI: soft, NT, ND, (+)BS Psychiatric: appropriate Neurological: Ox3 MSK- R hip disarticulation with VAC in place- less edema of area- seal is good- foley in place Genitourinary:    Comments: Foley in place with medium amber urine- more light amber this AM  MSK- R hip disarticulation with dressing C/D/I before therapy Musculoskeletal:  VAC in plac eon R hip disarticulation- with 50cc in VAC of sanguinous drainage.     Cervical back: Neck supple. No tenderness.     Comments: Right groin with dry padded dressing and appears to have old  serosanguinous drainage. Dressing C/D/I and dated 6/30  Ue's 5-/5 B/L throughout LLE- 5-/5 in L leg- throughout RLE- Hip disarticulation- no leg at all to test     Skin:    General: Skin is warm and dry.     Comments: Incisions right and left shoulder and left groin puckered with skin glue.     Neurological:     General: No focal deficit present.     Mental Status: She is alert and oriented to person, place, and time.     Comments: Flat affect. Decreased to light touch in L foot/ankle, otherwise intact to light touch in all 3 limbs   Assessment/Plan: 1. Functional deficits which require 3+ hours per day of interdisciplinary therapy in a comprehensive inpatient rehab setting. Physiatrist is providing close team supervision and 24 hour management of active medical problems listed below. Physiatrist and rehab team continue to assess barriers to discharge/monitor patient progress toward functional and medical goals  Care Tool:  Bathing          Body parts n/a: Right lower leg, Right  upper leg   Bathing assist Assist Level: Supervision/Verbal cueing (bed level)     Upper Body Dressing/Undressing Upper body dressing   What is the patient wearing?: Pull over shirt    Upper body assist Assist Level: Set up assist    Lower Body Dressing/Undressing Lower body dressing      What is the patient wearing?: Pants     Lower body assist Assist for lower body dressing: Supervision/Verbal cueing (bed level)     Toileting Toileting    Toileting assist Assist for toileting: Contact Guard/Touching assist     Transfers Chair/bed transfer  Transfers assist     Chair/bed transfer assist level: Contact Guard/Touching assist     Locomotion Ambulation   Ambulation assist      Assist level: Supervision/Verbal cueing Assistive device: Walker-rolling Max distance: 20'   Walk 10 feet activity   Assist     Assist level: Supervision/Verbal cueing Assistive device:  Walker-rolling   Walk 50 feet activity   Assist Walk 50 feet with 2 turns activity did not occur: Safety/medical concerns (fatigue, poor endurance)         Walk 150 feet activity   Assist Walk 150 feet activity did not occur: Safety/medical concerns (fatigue, poor endurance)         Walk 10 feet on uneven surface  activity   Assist Walk 10 feet on uneven surfaces activity did not occur: Safety/medical concerns (fatigue, poor endurance)         Wheelchair     Assist Is the patient using a wheelchair?: Yes Type of Wheelchair: Manual    Wheelchair assist level: Dependent - Patient 0% Max wheelchair distance: 250'    Wheelchair 50 feet with 2 turns activity    Assist    Wheelchair 50 feet with 2 turns activity did not occur: Safety/medical concerns   Assist Level: Dependent - Patient 0%   Wheelchair 150 feet activity     Assist      Assist Level: Dependent - Patient 0%   Blood pressure (!) 130/55, pulse 67, temperature 98.3 F (36.8 C), temperature source Oral, resp. rate 17, weight 53.7 kg, SpO2 100%.  Medical Problem List and Plan: 1. 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  2.  Antithrombotics: -DVT/anticoagulation:  Pharmaceutical: Lovenox               -antiplatelet therapy: N/A 3. Pain Management: Oxycodone  prn. D/c IV dilaudid . Increase gabapentin  to TID             --tylenol  prn.   7/2- pain overall well controlled per pt- will con't regimen  7/3- pain worse this AM, but no pain meds for 8 hours-   7/4- Pain ok at rest, but with touch on incision, pain  skyrockets- probably nerve pain/ischemic pain- will increase Gabapentin  to 400 mg TID- Also increased Oxy to 10 mg (from 5mg ) and q3 hours prn 4. Mood/Behavior/Sleep: LCSW to follow for evaluation and support.              -  antipsychotic agents: N/A 5. Neuropsych/cognition: This patient appears to be capable of making decisions on her own behalf. 6. Skin/Wound Care: Routine pressure relief measures. Continue Vitamin C  and protein supplements.  --daily monitoring of right axilla and Right groin wound  for healing.  7/2- VAC placed today to try and decrease risk of wound contamination- also significant drainage, to help with this-  7. Fluids/Electrolytes/Nutrition: Strict I/O with daily weights. Continue Ensure bid.  --Will also add juven/beneprotein to promote wound healing.  8.  MRSA bacteremia: To complete 6 week antibiotic course now on Linzolid 07/13/23 (confirmed w/ID NP that no further suppression needed).              --continue to monitor CK--last 1,132 on 06/16  7/2- will recheck CK on Monday 9.  HTN: Monitor BP TID. On amlodipine , hydralazine  TID and metoprolol --BP trending low today  7/2- BP doing better- controlled, but not really soft- con't to monitor and con't regimen             --will set parameters on meds to avoid orthostatic drop.   7/4- BP doing better 120's-135 systolic- con't regimen 10. Acute blood loss anemia:  Last transfused with one unit 06/29. Hgb 6.9-->9.0 today             --check anemia panel. Iron supplement started on 06/29  7.2- Hb up to 9.4 today- in spite of drainage- con't to monitor  7/4- Hb 8.7- might have dropped slightly- however VAC had 250cc in VAC (200cc in last 24 hours), so this fits- will rechekc Monday/Thursday to monitor- changed to CbC-with diff 11.  Leucocytosis: WBC trending down from 21.6 post surgery-->13.1              --recheck white count in am.  7/2- Pt's WBC down to 10k from 13k- so doing better  7/4- WBC 7.7k 12. Hypoxia: --Will  check CXR to follow up on pulmonary edema/intermittent tachypnea.              --note intermittent drop to low 80's last night/this am.  7/4- no recent drops 13.  Hyponatremia: Recheck in am. Wt 130--133 since hip disarticulation.              --monitor for signs of overload/likely multifactorial.   7/4- Na 132 14.  Severe protein Malnutrition: Monitor daily weighs--last alb @ 1.9.  Prostat and Juven also added. - check pre-albumin  level in am. Recheck Mg level in am.  7/2- Mg 2.0- as well as Albumin  1.9-  -ALT also slightly elevated at 51-will reduce Tylenol  to help- to 325 mg q 4 hours prn 15.  Pelvic prolapse: Pessary cleaned on 06/30/23.  Last dose diflucan  07/01  16. Urinary retention/Foley  7/3- high risk of VAC not working/keeping seal- so d/w with entire Team, will keep Foley for now - since put out 50-75cc in last 18 hours. So we decided although risk of CAUTI from foley, don't want high risk of sepsis-from bladder/bowel 17. Transaminitis  7/2- ALT 51- will reduce Tylenol  amount to reduce ALT levels. Will recheck next Monday      I spent a total of 51   minutes on total care today- >50% coordination of care- due to  D/w Dr Harden and WOC; d/w pharmacy- d/w PA about pain levels- as well as nursing and review of vitals, labs, and addition of doxycycline  per Dr Harden along with Linezolid - Also did IPOC  LOS: 3 days A FACE TO FACE EVALUATION WAS PERFORMED  Cottrell Gentles 07/07/2023, 9:46 AM

## 2023-07-07 NOTE — Progress Notes (Signed)
 Occupational Therapy Session Note  Patient Details  Name: Katherine Archer MRN: [id number] Date of Birth: 06-17-1955  Today's Date: 07/07/2023 OT Individual Time: 9265-9163 OT Individual Time Calculation (min): 62 min    Short Term Goals: Week 1:  OT Short Term Goal 1 (Week 1): STGs=LTGs due to patient's estimated length of stay.  Skilled Therapeutic Interventions/Progress Updates:   Pt greeted supine in bed, pt agreeable to OT intervention.     Interpreter present during session  Transfers/bed mobility/functional mobility:  Pt completed supine>sit with supervision. Sit>stand from EOB with CGA, pt opted to hop around the foot of bed to w/c with CGA- supervision.   Pt completed w/c mobility with supervision.   Therapeutic activity:  Pt completed standing balance task with pt instructed to alterating reaching BUEs OH to place weighted clothespins on vertical rod. Pt completed task with CGA but did not require UB support to complete task demonstrating excellent dynamic standing balance.    Exercises:  Pt completed below BUE therex with 4.4 lb weighted ball from sitting position, pt completed therex for 1 min with 30 sec rest break in between to build cardiovascular endurance:  Chest presses OH presses Bicep curls Wood chops to R and L side    Ended session with pt supine in bed with all needs within reach and bed alarm activated.                    Therapy Documentation Precautions:  Precautions Precautions: Fall Recall of Precautions/Restrictions: Intact Precaution/Restrictions Comments: Contact precs;watch BP, wound vac right hip Restrictions Weight Bearing Restrictions Per Provider Order: No RLE Weight Bearing Per Provider Order:  (AKA/wound vac) Other Position/Activity Restrictions: Rt hip disarticulation  Pain: Unrated stiffness reported in RLE, rest breaks provided as needed.     Therapy/Group: Individual Therapy  Ronal Gift [facility] 07/07/2023, 12:06 PM

## 2023-07-07 NOTE — Progress Notes (Signed)
 Occupational Therapy Session Note  Patient Details  Name: Katherine Archer MRN: [id number] Date of Birth: 1955-12-02  Today's Date: 07/07/2023 OT Individual Time: 1345-1425 OT Individual Time Calculation (min): 40 min    Short Term Goals: Week 1:  OT Short Term Goal 1 (Week 1): STGs=LTGs due to patient's estimated length of stay.  Skilled Therapeutic Interventions/Progress Updates:    Pt resting in bed upon arrival. Interpreter present. Pt remarked that she was really tired but agreeable to therex at bed level.  4.4 lb ball 3x10 Chest presses OH presses Biceps curls  Wood chops  Red theraband 3x10 Punches Diagonals  Pt remained in bed with all needs within reach.  Therapy Documentation Precautions:  Precautions Precautions: Fall Recall of Precautions/Restrictions: Intact Precaution/Restrictions Comments: Contact precs;watch BP, wound vac right hip Restrictions Weight Bearing Restrictions Per Provider Order: No RLE Weight Bearing Per Provider Order:  (AKA/wound vac) Other Position/Activity Restrictions: Rt hip disarticulation   Pain: Pt denies pain when at rest in bed    Therapy/Group: Individual Therapy  [name] 07/07/2023, 2:27 PM

## 2023-07-07 NOTE — Progress Notes (Signed)
 Physical Therapy Session Note  Patient Details  Name: Katherine Archer MRN: [id number] Date of Birth: Mar 19, 1955  Today's Date: 07/07/2023 PT Individual Time: 8961-8865 PT Individual Time Calculation (min): 56 min   Short Term Goals: Week 1:  PT Short Term Goal 1 (Week 1): STG=LTG  Skilled Therapeutic Interventions/Progress Updates: Patient supine in bed with interpretor present on entrance to room. Patient alert and agreeable to PT session.   Patient reported no pain during session. Pt somewhat hyperverbose about past medical history that required increased time to communicate with interpretor. Pt also reported wanting to work on dynamic standing balance, and to increase confidence/strength when standing with L LE. PTA encouraged pt to continue thinking about what pt would like to progress in order to increase pt's functional independence/understanding with pt's rehab.  Pt performed supine<>sit on EOB with modI and use of bed railing. PTA threaded pt's catheter tube/wound vac tube through hospital pants at beginning of session with totalA. Pt donned pants supine in bed independently. Pt performed sit<>stand pivot transfers throughout session with close supervision and use of RW. Pt propelled wheelchair WC from room<>day room gym independently with good demonstration of safe navigation of minimally. Pt tossing horseshoes to peg target. Pt reaching overhead with R UE to basketball net to grab horseshoes to increase challenge to dynamic standing balance reaching out of BOS. Pt performed with overall light CGA/close supervision and with L UE supported on RW. Pt also performed static standing balance without B UE supported with light minA/CGA, but unable to maintain longer than few seconds without needing support.  Patient supine in bed at end of session with brakes locked, bed alarm set, and all needs within reach.      Therapy Documentation Precautions:  Precautions Precautions: Fall Recall of  Precautions/Restrictions: Intact Precaution/Restrictions Comments: Contact precs;watch BP, wound vac right hip Restrictions Weight Bearing Restrictions Per Provider Order: No RLE Weight Bearing Per Provider Order:  (AKA/wound vac) Other Position/Activity Restrictions: Rt hip disarticulation  Therapy/Group: Individual Therapy  Demarie Uhlig PTA 07/07/2023, 12:26 PM

## 2023-07-07 NOTE — Consult Note (Addendum)
 WOC Nurse wound follow up Wound type: surgical Measurement:  see note dated 7/2 Wound bed: sutures, with some edges not approximated, clean pink Drainage (amount, consistency, odor)  serosanguinous in cannister  Periwound: intact, barrier ring placed at the distal end of wound, near the labia, and at the crease between hip and abd to promote seal.  If the VAC cannot maintain  seal, bedside nursing will need to remove and change to a dry dressing and notify Dr Harden.   Patient also with increased pain levels at distal end of wound, very painful to touch.  A secure communication was send to Dr Harden and Dr Cornelio as follows: good morning Dr Harden, we just changed the [facility] dressing on his patient, she has an abnormally high amount of pain in the inferior aspect of the wound, simply toughing it or lightly pressing causes her excruciating pain, this is not the case in any other part of the wound.  Dressing procedure/placement/frequency: Removed old NPWT dressing   Filled wound with   __3__ piece of black foam, barrier rings utilized to achieve seal. Sealed NPWT dressing at HG Patient received PO pain medication per bedside nurse prior to dressing change Patient  has severe pain at the distal end of the wound causing great discomfort during dressing change.  WOC nurse will continue to provide NPWT dressing changed due to the complexity of the dressing change.   Doyal Polite, RN, MSN, [facility] WOC Team

## 2023-07-07 NOTE — IPOC Note (Signed)
 Overall Plan of Care [facility]) Patient Details Name: Katherine Archer MRN: [id number] DOB: [birth year]  Admitting Diagnosis: History of disarticulation of right hip  Hospital Problems: Principal Problem:   History of disarticulation of right hip     Functional Problem List: Nursing Pain, Bladder, Bowel, Safety, Endurance, Medication Management, Skin Integrity  PT Balance, Behavior, Endurance, Motor, Pain, Safety, Skin Integrity  OT  Balance, sensory, edema, skin integrity, endurance, motor, pain  SLP    TR         Basic ADL's: OT Eating, Grooming, Bathing, Dressing, Toileting     Advanced  ADL's: OT       Transfers: PT Bed Mobility, Car, Bed to Chair  OT Toilet     Locomotion: PT Wheelchair Mobility, Ambulation, Stairs     Additional Impairments: OT None  SLP        TR      Anticipated Outcomes Item Anticipated Outcome  Self Feeding mod I  Swallowing      Basic self-care  mod I  Toileting  mod I   Bathroom Transfers mod I  Bowel/Bladder  manage bowel w mod I and bladder w min assist  Transfers  mod i  Locomotion  supervision  Communication     Cognition     Pain  Pain < 4 with prns  Safety/Judgment  manage safety w cues   Therapy Plan: PT Intensity: Minimum of 1-2 x/day ,45 to 90 minutes PT Frequency: 5 out of 7 days PT Duration Estimated Length of Stay: 7-10 days OT Intensity: Minimum of 1-2 x/day, 45 to 90 minutes OT Frequency: 5 out of 7 days OT Duration/Estimated Length of Stay: 7-10 days     Team Interventions: Nursing Interventions Patient/Family Education, Pain Management, Medication Management, Bladder Management, Bowel Management, Discharge Planning, Disease Management/Prevention, Skin Care/Wound Management  PT interventions Ambulation/gait training, Community reintegration, DME/adaptive equipment instruction, Neuromuscular re-education, Psychosocial support, Stair training, UE/LE Strength taining/ROM, Wheelchair propulsion/positioning,  Warden/ranger, Discharge planning, Pain management, Skin care/wound management, Therapeutic Activities, UE/LE Coordination activities, Cognitive remediation/compensation, Disease management/prevention, Functional mobility training, Patient/family education, Splinting/orthotics, Therapeutic Exercise, Visual/perceptual remediation/compensation  OT Interventions Balance/vestibular training, Discharge planning, Pain management, Self Care/advanced ADL retraining, Therapeutic Activities, UE/LE Coordination activities, Cognitive remediation/compensation, Disease mangement/prevention, Functional mobility training, Patient/family education, Skin care/wound managment, Therapeutic Exercise, Community reintegration, Fish farm manager, Neuromuscular re-education, Psychosocial support, Splinting/orthotics, Wheelchair propulsion/positioning, UE/LE Strength taining/ROM  SLP Interventions    TR Interventions    SW/CM Interventions     Barriers to Discharge MD  Medical stability, Home enviroment access/loayout, Incontinence, Wound care, Lack of/limited family support, Weight, and Weight bearing restrictions  Nursing Decreased caregiver support, Home environment access/layout 1 level mobile ramped entry  PT Wound Care, Home environment access/layout wound vac right hip  OT      SLP      SW       Team Discharge Planning: Destination: PT-Home ,OT- Home , SLP-  Projected Follow-up: PT-Home health PT, Outpatient PT, OT-  Home health OT, SLP-  Projected Equipment Needs: PT-To be determined, OT- None recommended by OT, SLP-  Equipment Details: PT-pt has RW, pt needs WC, OT-  Patient/family involved in discharge planning: PT- Patient,  OT-Patient, SLP-   MD ELOS: 7-10 days Medical Rehab Prognosis:  Good Assessment: The patient has been admitted for CIR therapies with the diagnosis of R hip disarticulation with VAC and Bacteremia. The team will be addressing functional mobility,  strength, stamina, balance, safety, adaptive techniques and equipment, self-care, bowel and bladder  mgt, patient and caregiver education, preprosthesis training. Prolonged PO ABX. Goals have been set at supervision ot mod I. Anticipated discharge destination is home with family.        See Team Conference Notes for weekly updates to the plan of care

## 2023-07-07 NOTE — Progress Notes (Signed)
 Physical Therapy Session Note  Patient Details  Name: Katherine Archer MRN: [id number] Date of Birth: February 15, 1955  Today's Date: 07/07/2023 PT Individual Time: 1400-1500     Short Term Goals: Week 1:  PT Short Term Goal 1 (Week 1): STG=LTG  Skilled Therapeutic Interventions/Progress Updates:    Pt in bed when PT arrived, pt agreeable for PT session.  Pt reports slightly fatigued from this morning.  Translator present during session. Pt performed LE ex's in bed: LLE hip abd/add, heel slides, SLR; 2x10 each, bridging x10.  Supine to sit EOB with supervision railing used.  Pt able to sit EOB without back support, standing to RW with supervision x3', alternating single HHA with RW with good trunk control, no LOB, single leg heel raises x10.  Pt reports feeling weak and needing to rest at EOB. STS x2 from bed to RW with supervision. Pt requesting to stay in bed post session.  All items within reach, pt comfortable.   Therapy Documentation Precautions Precautions: Fall Recall of Precautions/Restrictions: Intact Precaution/Restrictions Comments: Contact precs;watch BP, wound vac right hip Restrictions Weight Bearing Restrictions Per Provider Order: No RLE Weight Bearing Per Provider Order:  (AKA/wound vac) Other Position/Activity Restrictions: Rt hip disarticulation  Pain: Pain Assessment Pain Scale: 0-10 Pain Score: 4  Pain Location: Hip Pain Intervention(s): Medication (See eMAR)     Therapy/Group: Individual Therapy  [name] Fast 07/07/2023, 12:03 PM

## 2023-07-08 NOTE — Plan of Care (Signed)
  Problem: Consults Goal: RH LIMB LOSS PATIENT EDUCATION Description: Description: See Patient Education module for eduction specifics. 07/08/2023 0408 by Phil Reina LABOR, RN Outcome: Progressing Goal: Nutrition Consult-if indicated 07/08/2023 0408 by Phil Reina LABOR, RN Outcome: Progressing  Goal: Diabetes Guidelines if Diabetic/Glucose > 140 Description: If diabetic or lab glucose is > 140 mg/dl - Initiate Diabetes/Hyperglycemia Guidelines & Document Interventions  07/08/2023 0408 by Phil Reina A, RN Outcome: Progressing   Problem: RH BOWEL ELIMINATION Goal: RH STG MANAGE BOWEL WITH ASSISTANCE Description: STG Manage Bowel with toileting Assistance. 07/08/2023 0408 by Phil Reina LABOR, RN Outcome: Progressing  Goal: RH STG MANAGE BOWEL W/MEDICATION W/ASSISTANCE Description: STG Manage Bowel with Medication with mod I Assistance. 07/08/2023 0408 by Phil Reina LABOR, RN Outcome: Progressing    Problem: RH BLADDER ELIMINATION Goal: RH STG MANAGE BLADDER WITH ASSISTANCE Description: STG Manage Bladder With min Assistance 07/08/2023 0408 by Phil Reina LABOR, RN Outcome: Progressing  Goal: RH STG MANAGE BLADDER WITH EQUIPMENT WITH ASSISTANCE Description: STG Manage Bladder With Equipment With min Assistance 07/08/2023 0408 by Phil Reina A, RN Outcome: Progressing  Problem: RH SKIN INTEGRITY Goal: RH STG SKIN FREE OF INFECTION/BREAKDOWN Description: Manage skin w min assist 07/08/2023 0408 by Phil Reina A, RN Outcome: Progressing    Problem: RH SAFETY Goal: RH STG ADHERE TO SAFETY PRECAUTIONS W/ASSISTANCE/DEVICE Description: STG Adhere to Safety Precautions With cues  Assistance/Device. 07/08/2023 0408 by Phil Reina A, RN Outcome: Progressing  Problem: RH PAIN MANAGEMENT Goal: RH STG PAIN MANAGED AT OR BELOW PT'S PAIN GOAL Description: Pain < 4 with prns 07/08/2023 0408 by Phil Reina A, RN Outcome: Progressing  Problem: RH KNOWLEDGE DEFICIT LIMB  LOSS Goal: RH STG INCREASE KNOWLEDGE OF SELF CARE AFTER LIMB LOSS Description: Patient and family will be able to manage care at discharge using educational resources for medication, skin care and dietary modification independently 07/08/2023 0408 by Phil Reina A, RN Outcome: Progressing  Problem: RH SKIN INTEGRITY Goal: RH STG SKIN FREE OF INFECTION/BREAKDOWN 07/08/2023 0408 by Phil Reina LABOR, RN Outcome: Progressing

## 2023-07-08 NOTE — Progress Notes (Signed)
 PROGRESS NOTE   Subjective/Complaints: No complaints this morning Asks for ice Patient's chart reviewed- No issues reported overnight Vitals signs stable   ROS:   Pt denies SOB, abd pain, CP, N/V/C/D, and vision changes   Objective:   No results found.  Recent Labs    07/06/23 0409  WBC 7.7  HGB 8.7*  HCT 27.5*  PLT 286   Recent Labs    07/06/23 0409  NA 132*  K 3.9  CL 97*  CO2 25  GLUCOSE 102*  BUN 13  CREATININE 0.52  CALCIUM  9.0    Intake/Output Summary (Last 24 hours) at 07/08/2023 1611 Last data filed at 07/08/2023 9177 Gross per 24 hour  Intake 240 ml  Output 2475 ml  Net -2235 ml        Physical Exam: Vital Signs Blood pressure (!) 138/51, pulse 67, temperature 98.1 F (36.7 C), temperature source Oral, resp. rate 16, weight 58.2 kg, SpO2 100%.      General: awake, alert, appropriate, using interpretor to talk with pt; supine in bed; NAD HENT: conjugate gaze; oropharynx moist CV: regular rate and rhythm; no JVD Pulmonary: CTA B/L; no W/R/R- good air movement GI: soft, NT, ND, (+)BS Psychiatric: appropriate Neurological: Ox3 MSK- R hip disarticulation with VAC in place- less edema of area- seal is good- foley in place Genitourinary:    Comments: Foley in place with medium amber urine- more light amber this AM  MSK- R hip disarticulation with dressing C/D/I before therapy Musculoskeletal:  VAC in plac eon R hip disarticulation- with 50cc in VAC of sanguinous drainage.     Cervical back: Neck supple. No tenderness.     Comments: Right groin with dry padded dressing and appears to have old serosanguinous drainage. Dressing C/D/I and dated 6/30  Ue's 5-/5 B/L throughout LLE- 5-/5 in L leg- throughout RLE- Hip disarticulation- no leg at all to test  stable 7/5   Skin:    General: Skin is warm and dry.     Comments: Incisions right and left shoulder and left groin puckered with skin  glue.     Neurological:     General: No focal deficit present.     Mental Status: She is alert and oriented to person, place, and time.     Comments: Flat affect. Decreased to light touch in L foot/ankle, otherwise intact to light touch in all 3 limbs   Assessment/Plan: 1. Functional deficits which require 3+ hours per day of interdisciplinary therapy in a comprehensive inpatient rehab setting. Physiatrist is providing close team supervision and 24 hour management of active medical problems listed below. Physiatrist and rehab team continue to assess barriers to discharge/monitor patient progress toward functional and medical goals  Care Tool:  Bathing          Body parts n/a: Right lower leg, Right upper leg   Bathing assist Assist Level: Supervision/Verbal cueing (bed level)     Upper Body Dressing/Undressing Upper body dressing   What is the patient wearing?: Pull over shirt    Upper body assist Assist Level: Set up assist    Lower Body Dressing/Undressing Lower body dressing      What  is the patient wearing?: Pants     Lower body assist Assist for lower body dressing: Supervision/Verbal cueing (bed level)     Toileting Toileting    Toileting assist Assist for toileting: Contact Guard/Touching assist     Transfers Chair/bed transfer  Transfers assist     Chair/bed transfer assist level: Contact Guard/Touching assist     Locomotion Ambulation   Ambulation assist      Assist level: Supervision/Verbal cueing Assistive device: Walker-rolling Max distance: 20'   Walk 10 feet activity   Assist     Assist level: Supervision/Verbal cueing Assistive device: Walker-rolling   Walk 50 feet activity   Assist Walk 50 feet with 2 turns activity did not occur: Safety/medical concerns (fatigue, poor endurance)         Walk 150 feet activity   Assist Walk 150 feet activity did not occur: Safety/medical concerns (fatigue, poor endurance)          Walk 10 feet on uneven surface  activity   Assist Walk 10 feet on uneven surfaces activity did not occur: Safety/medical concerns (fatigue, poor endurance)         Wheelchair     Assist Is the patient using a wheelchair?: Yes Type of Wheelchair: Manual    Wheelchair assist level: Dependent - Patient 0% Max wheelchair distance: 250'    Wheelchair 50 feet with 2 turns activity    Assist    Wheelchair 50 feet with 2 turns activity did not occur: Safety/medical concerns   Assist Level: Dependent - Patient 0%   Wheelchair 150 feet activity     Assist      Assist Level: Dependent - Patient 0%   Blood pressure (!) 138/51, pulse 67, temperature 98.1 F (36.7 C), temperature source Oral, resp. rate 16, weight 58.2 kg, SpO2 100%.  Medical Problem List and Plan: 1. 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  2.  Antithrombotics: -DVT/anticoagulation:  Pharmaceutical: Lovenox               -antiplatelet therapy: N/A 3. Pain Management: Oxycodone  prn. D/c IV dilaudid . Increase gabapentin  to TID             --tylenol  prn.   7/2- pain overall well controlled per pt- will con't regimen  7/3- pain worse this AM, but no pain meds for 8 hours-   ContinueGabapentin to 400 mg TID- Also increased Oxy to 10 mg (from 5mg ) and q3 hours prn  4. Mood/Behavior/Sleep: LCSW to follow for evaluation and support.              -antipsychotic agents: N/A  5. Neuropsych/cognition: This patient appears to be capable of making decisions on her own behalf.  6. Skin/Wound Care: Routine pressure relief  measures. Continue Vitamin C  and protein supplements.  --daily monitoring of right axilla and Right groin wound  for healing.  7/2- VAC placed today to try and decrease risk of wound contamination- also significant drainage, to help with this-   7. Fluids/Electrolytes/Nutrition: Strict I/O with daily  weights. Continue Ensure bid.  --Will also add juven/beneprotein to promote wound healing.   8.  MRSA bacteremia: To complete 6 week antibiotic course now on Linzolid 07/13/23 (confirmed w/ID NP that no further suppression needed), continue             --continue to monitor CK--last 1,132 on 06/16  7/2- will recheck CK on Monday  9.  HTN: Monitor BP TID. continue amlodipine , hydralazine  TID and metoprolol --BP trending low today  7/2- BP doing better- controlled, but not really soft- con't to monitor and con't regimen             --will set parameters on meds to avoid orthostatic drop.   7/4- BP doing better 120's-135 systolic- con't regimen 10. Acute blood loss anemia:  Last transfused with one unit 06/29. Hgb 6.9-->9.0 today             --check anemia panel. Iron supplement started on 06/29  7.2- Hb up to 9.4 today- in spite of drainage- con't to monitor  7/4- Hb 8.7- might have dropped slightly- however VAC had 250cc in VAC (200cc in last 24 hours), so this fits- will rechekc Monday/Thursday to monitor- changed to CbC-with diff  11.  Leucocytosis: WBC trending down from 21.6 post surgery-->13.1              --recheck white count in am.  7/2- Pt's WBC down to 10k from 13k- so doing better  7/4- WBC 7.7k  12. Hypoxia: --Will check CXR to follow up on pulmonary edema/intermittent tachypnea.              --note intermittent drop to low 80's last night/this am.  7/4- no recent drops  13.  Hyponatremia: Recheck in am. Wt 130--133 since hip disarticulation.              --monitor for signs of overload/likely multifactorial.   7/4- Na 132  14.  Severe protein Malnutrition: Monitor daily  weighs--last alb @ 1.9.  Prostat and Juven also added. - check pre-albumin  level in am. Recheck Mg level in am.  7/2- Mg 2.0- as well as Albumin  1.9-  -ALT also slightly elevated at 51-will reduce Tylenol  to help- to 325 mg q 4 hours prn  15.  Pelvic prolapse: Pessary cleaned on 06/30/23.  Last dose diflucan  07/01   16. Urinary retention/Foley  7/3- high risk of VAC not working/keeping seal- so d/w with entire Team, will keep Foley for now - since put out 50-75cc in last 18 hours. So we decided although risk of CAUTI from foley, don't want high risk of sepsis-from bladder/bowel  17. Transaminitis  7/2- ALT 51- will reduce Tylenol  amount to reduce ALT levels. Will recheck next Monday      LOS: 4 days A FACE TO FACE EVALUATION WAS PERFORMED  Sven P Atalaya Zappia 07/08/2023, 4:11 PM

## 2023-07-09 MED ORDER — AMLODIPINE BESYLATE 5 MG PO TABS
5.0000 mg | ORAL_TABLET | Freq: Every day | ORAL | Status: DC
  Administered 2023-07-10 – 2023-07-13 (×4): 5 mg via ORAL
  Filled 2023-07-09 (×4): qty 1

## 2023-07-09 NOTE — Progress Notes (Signed)
 Physical Therapy Session Note  Patient Details  Name: Katherine Archer MRN: [id number] Date of Birth: 1955-05-08  Today's Date: 07/09/2023 PT Individual Time: 1015-1110 PT Individual Time Calculation (min): 55 min   Short Term Goals: Week 1:  PT Short Term Goal 1 (Week 1): STG=LTG  Skilled Therapeutic Interventions/Progress Updates:    Chart reviewed and pt agreeable to therapy. Pt received semi-reclined in bed with no c/o pain. Also of note, iPad interpreter used t/o session. Session focused on functional transfers and WC mobility to promote home access. Pt initiated session with transfer to [facility] via SPT using S + RW. Pt then completed 238ft WC propulsion to therapy gym using S. In gym, pt completed blocked practice of 56ft amb using CGA + RW fading to S + RW. Pt then returned to room via WC propulsion at some assist level. In room. Pt completed amb to recliner using CGA + RW for tight spaces. Session education emphasized fall prevention and need for lap belt, to which pt agreed. At end of session, pt was left seated in recliner with alarm engaged, nurse call bell and all needs in reach.     Therapy Documentation Precautions:  Precautions Precautions: Fall Recall of Precautions/Restrictions: Intact Precaution/Restrictions Comments: Contact precs;watch BP, wound vac right hip Restrictions Weight Bearing Restrictions Per Provider Order: No RLE Weight Bearing Per Provider Order: Weight bearing as tolerated Other Position/Activity Restrictions: Rt hip disarticulation General:     Therapy/Group: Individual Therapy  [name] 07/09/2023, 12:26 PM

## 2023-07-09 NOTE — Progress Notes (Signed)
 Wound vac in place to hip incision. Marked drainage level on cannister at start of my shift. PRN oxy ir 10mg 's given at 2105. Foley patent, urine cloudy. LBM 07/05.Chanoch Mccleery A

## 2023-07-09 NOTE — Progress Notes (Signed)
 Interpreter used for doing line care.  [place] #[id number]

## 2023-07-09 NOTE — Progress Notes (Signed)
 PROGRESS NOTE   Subjective/Complaints: No new complaints this morning Appreciate nursing note No issues overnight Wound vac draining  ROS:   Pt denies SOB, abd pain, CP, N/V/C/D, and vision changes   Objective:   No results found.  No results for input(s): WBC, HGB, HCT, PLT in the last 72 hours.  No results for input(s): NA, K, CL, CO2, GLUCOSE, BUN, CREATININE, CALCIUM  in the last 72 hours.   Intake/Output Summary (Last 24 hours) at 07/09/2023 1117 Last data filed at 07/09/2023 9078 Gross per 24 hour  Intake 360 ml  Output 2175 ml  Net -1815 ml        Physical Exam: Vital Signs Blood pressure (!) 132/54, pulse 70, temperature 98.9 F (37.2 C), temperature source Oral, resp. rate 15, weight 57.9 kg, SpO2 98%.      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     Cervical back: Neck supple. No tenderness.     Comments: Right groin with dry padded dressing and appears to have old serosanguinous drainage. Dressing C/D/I and dated 6/30  Ue's 5-/5 B/L throughout LLE- 5-/5 in L leg- throughout RLE- Hip disarticulation- no leg at all to test  stable 7/6   Skin:    General: Skin is warm and dry.     Comments: Incisions right and left shoulder and left groin puckered with skin glue.     Neurological:     General: No focal deficit present.      Mental Status: She is alert and oriented to person, place, and time.     Comments: Flat affect. Decreased to light touch in L foot/ankle, otherwise intact to light touch in all 3 limbs   Assessment/Plan: 1. Functional deficits which require 3+ hours per day of interdisciplinary therapy in a comprehensive inpatient rehab setting. Physiatrist is providing close team supervision and 24 hour management of active medical problems listed below. Physiatrist and rehab team continue to assess barriers to discharge/monitor patient progress toward functional and medical goals  Care Tool:  Bathing          Body parts n/a: Right lower leg, Right upper leg   Bathing assist Assist Level: Supervision/Verbal cueing (bed level)     Upper Body Dressing/Undressing Upper body dressing   What is the patient wearing?: Pull over shirt    Upper body assist Assist Level: Set up assist    Lower Body Dressing/Undressing Lower body dressing      What is the patient wearing?: Pants     Lower body assist Assist for lower body dressing: Supervision/Verbal cueing (bed level)     Toileting Toileting    Toileting assist  Assist for toileting: Contact Guard/Touching assist     Transfers Chair/bed transfer  Transfers assist     Chair/bed transfer assist level: Contact Guard/Touching assist     Locomotion Ambulation   Ambulation assist      Assist level: Supervision/Verbal cueing Assistive device: Walker-rolling Max distance: 20'   Walk 10 feet activity   Assist     Assist level: Supervision/Verbal cueing Assistive device: Walker-rolling   Walk 50 feet activity   Assist Walk 50 feet with 2 turns activity did not occur: Safety/medical concerns (fatigue, poor endurance)         Walk 150 feet activity   Assist Walk 150 feet activity did not occur: Safety/medical concerns (fatigue, poor endurance)         Walk 10 feet on uneven surface  activity   Assist Walk 10  feet on uneven surfaces activity did not occur: Safety/medical concerns (fatigue, poor endurance)         Wheelchair     Assist Is the patient using a wheelchair?: Yes Type of Wheelchair: Manual    Wheelchair assist level: Dependent - Patient 0% Max wheelchair distance: 250'    Wheelchair 50 feet with 2 turns activity    Assist    Wheelchair 50 feet with 2 turns activity did not occur: Safety/medical concerns   Assist Level: Dependent - Patient 0%   Wheelchair 150 feet activity     Assist      Assist Level: Dependent - Patient 0%   Blood pressure (!) 132/54, pulse 70, temperature 98.9 F (37.2 C), temperature source Oral, resp. rate 15, weight 57.9 kg, SpO2 98%.  Medical Problem List and Plan: 1. 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   2.  Antithrombotics: -DVT/anticoagulation:  Pharmaceutical: Lovenox               -antiplatelet therapy: N/A 3. Pain Management: Oxycodone  prn. D/c IV dilaudid . Increase gabapentin  to TID             --tylenol  prn.   7/2- pain overall well controlled per pt- will con't regimen  7/3- pain worse this AM, but no pain meds for 8 hours-   ContinueGabapentin to 400 mg TID- Also increased Oxy to 10 mg (from 5mg ) and q3 hours prn  4. Mood/Behavior/Sleep: LCSW to follow for evaluation and support.              -antipsychotic agents: N/A  5. Neuropsych/cognition: This patient appears to be capable of making decisions on her own behalf.  6. Skin/Wound Care: Routine pressure relief measures. Continue Vitamin C  and protein supplements.  --daily  monitoring of right axilla and Right groin wound  for healing.  7/2- VAC placed today to try and decrease risk of wound contamination- also significant drainage, to help with this-   7. Fluids/Electrolytes/Nutrition: Strict I/O with daily weights. Continue Ensure bid.  --Will also add juven/beneprotein to promote wound healing.   8.  MRSA bacteremia: To complete 6 week antibiotic course now on Linzolid 07/13/23 (confirmed w/ID  NP that no further suppression needed), continue Linezolid              --continue to monitor CK--last 1,132 on 06/16  7/2- will recheck CK on Monday  9.  HTN: Monitor BP TID. continue amlodipine , hydralazine  TID and metoprolol --BP trending low today  7/2- BP doing better- controlled, but not really soft- con't to monitor and con't regimen             --will set parameters on meds to avoid orthostatic drop.   7/4- BP doing better 120's-135 systolic- con't regimen  Decrease amlodipine  to 5mg  given diastolic hypotension  10. Acute blood loss anemia:  Last transfused with one unit 06/29. Hgb 6.9-->9.0 today             --check anemia panel. Iron supplement started on 06/29  7.2- Hb up to 9.4 today- in spite of drainage- con't to monitor  7/4- Hb 8.7- might have dropped slightly- however VAC had 250cc in VAC (200cc in last 24 hours), so this fits- will rechekc Monday/Thursday to monitor- changed to CbC-with diff  11.  Leucocytosis: WBC trending down from 21.6 post surgery-->13.1              --recheck white count in am.  7/2- Pt's WBC down to 10k from 13k- so doing better  7/4- WBC 7.7k  12. Hypoxia: --Will check CXR to follow up on pulmonary edema/intermittent tachypnea.              --note intermittent drop to low 80's last night/this am.  7/4- no recent drops  13.  Hyponatremia: Recheck in am. Wt 130--133 since hip disarticulation.              --monitor for signs of overload/likely multifactorial.   7/4- Na 132  14.  Severe protein Malnutrition: Monitor daily  weighs--last alb @ 1.9.  Prostat and Juven also added. - check pre-albumin  level in am. Recheck Mg level in am.  7/2- Mg 2.0- as well as Albumin  1.9-  -ALT also slightly elevated at 51-will reduce Tylenol  to help- to 325 mg q 4 hours prn  15.  Pelvic prolapse: Pessary cleaned on 06/30/23.  Last dose diflucan  07/01   16. Urinary retention/Foley  7/3- high risk of VAC not working/keeping seal- so d/w with entire Team, will keep Foley for now - since put out 50-75cc in last 18 hours. So we decided although risk of CAUTI from foley, don't want high risk of sepsis-from bladder/bowel  Continue foley  17. Transaminitis  7/2- ALT 51- will reduce Tylenol  amount to reduce ALT levels. Will recheck next Monday      LOS: 5 days A FACE TO FACE EVALUATION WAS PERFORMED  Katherine Archer P Earleen Aoun 07/09/2023, 11:17 AM

## 2023-07-09 NOTE — Progress Notes (Signed)
 Occupational Therapy Session Note  Patient Details  Name: Katherine Archer MRN: [id number] Date of Birth: 1955-04-19  Today's Date: 07/09/2023 OT Individual Time: 9194-9180 & 1300-1415 OT Individual Time Calculation (min): 14 min & 75 min and Today's Date: 07/09/2023 OT Missed Time: 46 Minutes Missed Time Reason: Pain;Patient fatigue   Short Term Goals: Week 1:  OT Short Term Goal 1 (Week 1): STGs=LTGs due to patient's estimated length of stay.  Skilled Therapeutic Interventions/Progress Updates:      Therapy Documentation Precautions:  Precautions Precautions: Fall Recall of Precautions/Restrictions: Intact Precaution/Restrictions Comments: Contact precs;watch BP, wound vac right hip Restrictions Weight Bearing Restrictions Per Provider Order: No RLE Weight Bearing Per Provider Order: Weight bearing as tolerated Other Position/Activity Restrictions: Rt hip disarticulation Session 1 General: Pt supine in bed upon OT arrival, agreeable to OT session.  Pain:  8/10 pain reported in Rt hip, activity, intermittent rest breaks, distractions provided for pain management, pt reports tolerable to proceed.   Other Treatments: OT set up [employer] for use of interpreter. Pt reporting increased pain and fatigue this AM with Rt hip. OT providing repositioning and education for pain management. After education, pt politely declining further services this AM. OT educated importance in participating in therapy, pt still politely declining. OT will attempt to make up missed minutes as able.    Pt supine in bed with bed alarm activated, 2 bed rails up, call light within reach and 4Ps assessed.   Session 2 General: Pt supine in bed upon OT arrival, agreeable to OT session. Granddaughter present.  Pain:  8/10 pain reported in Rt hip, activity, intermittent rest breaks, distractions provided for pain management, pt reports tolerable to proceed.   ADL: OT providing skilled intervention on ADL retraining in  order to increase independence with tasks and increase activity tolerance. Pt completed the following tasks at the current level of assist: Bed mobility: SBA from flat bed supine><EOB Transfers: SBA with RW stand step from EOB>W/C  Exercises: OT instructing pt in completing the following exercise circuit in order to improve functional activity, strength and endurance to prepare for ADLs. Pt completed the following exercises in seated position with no noted LOB/SOB and 3x10 repetitions on each exercise: -bicep curls -triceps extensions -shoulder abduction -shoulder flexion   Other Treatments: OT providing education on wound vac and foley for D/C prep. OT discussed with granddaughter need for nsf education before D/C in order to properly care for foley, wound vac and PICC line since pt will be living with granddaughter. OT reported will follow up with team. Pt and granddaughter inquiring about hospital bed, OT educating on hospital bed received from insurance, OT reported will discuss with SW. OT discussing pt's functional level of assist and progress with therapy goals, pt and granddaughter pleased with information. OT switching cushion to ROHO cusion for pressure relief, pain relief and to maintain skin integrity of Rt hip.   Pt supine in bed with bed alarm activated, 2 bed rails up, call light within reach and 4Ps assessed. Direct handoff to nsg for medication. Granddaughter present    Therapy/Group: Individual Therapy  Camie Hoe, OTD, OTR/L 07/09/2023, 4:18 PM

## 2023-07-10 ENCOUNTER — Encounter

## 2023-07-10 ENCOUNTER — Other Ambulatory Visit (HOSPITAL_COMMUNITY): Payer: Self-pay

## 2023-07-10 LAB — COMPREHENSIVE METABOLIC PANEL WITH GFR
ALT: 29 U/L (ref 0–44)
AST: 20 U/L (ref 15–41)
Albumin: 2 g/dL — ABNORMAL LOW (ref 3.5–5.0)
Alkaline Phosphatase: 76 U/L (ref 38–126)
Anion gap: 9 (ref 5–15)
BUN: 23 mg/dL (ref 8–23)
CO2: 23 mmol/L (ref 22–32)
Calcium: 8.8 mg/dL — ABNORMAL LOW (ref 8.9–10.3)
Chloride: 101 mmol/L (ref 98–111)
Creatinine, Ser: 0.57 mg/dL (ref 0.44–1.00)
GFR, Estimated: >60 mL/min (ref >60)
Glucose, Bld: 97 mg/dL (ref 70–99)
Potassium: 4 mmol/L (ref 3.5–5.1)
Sodium: 133 mmol/L — ABNORMAL LOW (ref 135–145)
Total Bilirubin: 0.3 mg/dL (ref 0.0–1.2)
Total Protein: 6 g/dL — ABNORMAL LOW (ref 6.5–8.1)

## 2023-07-10 LAB — CBC WITH DIFFERENTIAL/PLATELET
Abs Immature Granulocytes: 0.05 K/uL (ref 0.00–0.07)
Basophils Absolute: 0 K/uL (ref 0.0–0.1)
Basophils Relative: 0 %
Eosinophils Absolute: 0.1 K/uL (ref 0.0–0.5)
Eosinophils Relative: 2 %
HCT: 28.3 % — ABNORMAL LOW (ref 36.0–46.0)
Hemoglobin: 8.8 g/dL — ABNORMAL LOW (ref 12.0–15.0)
Immature Granulocytes: 1 %
Lymphocytes Relative: 24 %
Lymphs Abs: 1.8 K/uL (ref 0.7–4.0)
MCH: 28.9 pg (ref 26.0–34.0)
MCHC: 31.1 g/dL (ref 30.0–36.0)
MCV: 92.8 fL (ref 80.0–100.0)
Monocytes Absolute: 0.5 K/uL (ref 0.1–1.0)
Monocytes Relative: 7 %
Neutro Abs: 5.1 K/uL (ref 1.7–7.7)
Neutrophils Relative %: 66 %
Platelets: 293 K/uL (ref 150–400)
RBC: 3.05 MIL/uL — ABNORMAL LOW (ref 3.87–5.11)
RDW: 15.1 % (ref 11.5–15.5)
WBC: 7.7 K/uL (ref 4.0–10.5)
nRBC: 0 % (ref 0.0–0.2)

## 2023-07-10 LAB — CK: Total CK: 10 U/L — ABNORMAL LOW (ref 38–234)

## 2023-07-10 MED ORDER — EZETIMIBE 10 MG PO TABS
10.0000 mg | ORAL_TABLET | Freq: Every day | ORAL | 0 refills | Status: AC
  Filled 2023-07-10: qty 30, 30d supply, fill #0

## 2023-07-10 MED ORDER — MELATONIN 5 MG PO TABS
5.0000 mg | ORAL_TABLET | Freq: Every evening | ORAL | 0 refills | Status: DC | PRN
  Filled 2023-07-10: qty 30, 30d supply, fill #0

## 2023-07-10 MED ORDER — OXYCODONE HCL 10 MG PO TABS
10.0000 mg | ORAL_TABLET | Freq: Four times a day (QID) | ORAL | 0 refills | Status: DC | PRN
  Filled 2023-07-10: qty 28, 7d supply, fill #0

## 2023-07-10 MED ORDER — GABAPENTIN 400 MG PO CAPS
400.0000 mg | ORAL_CAPSULE | Freq: Three times a day (TID) | ORAL | 0 refills | Status: DC
  Filled 2023-07-10: qty 90, 30d supply, fill #0

## 2023-07-10 MED ORDER — METOPROLOL SUCCINATE ER 25 MG PO TB24
12.5000 mg | ORAL_TABLET | Freq: Every day | ORAL | 0 refills | Status: DC
  Filled 2023-07-10: qty 15, 30d supply, fill #0

## 2023-07-10 MED ORDER — SENNOSIDES-DOCUSATE SODIUM 8.6-50 MG PO TABS
2.0000 | ORAL_TABLET | Freq: Every day | ORAL | 0 refills | Status: DC
  Filled 2023-07-10: qty 60, 30d supply, fill #0

## 2023-07-10 MED ORDER — ACETAMINOPHEN 325 MG PO TABS
325.0000 mg | ORAL_TABLET | ORAL | 0 refills | Status: DC | PRN
  Filled 2023-07-10: qty 200, 34d supply, fill #0

## 2023-07-10 MED ORDER — PANTOPRAZOLE SODIUM 40 MG PO TBEC
40.0000 mg | DELAYED_RELEASE_TABLET | Freq: Every day | ORAL | 0 refills | Status: AC
  Filled 2023-07-10: qty 30, 30d supply, fill #0

## 2023-07-10 MED ORDER — ASCORBIC ACID 500 MG PO TABS
250.0000 mg | ORAL_TABLET | Freq: Two times a day (BID) | ORAL | 0 refills | Status: DC
  Filled 2023-07-10: qty 30, 30d supply, fill #0

## 2023-07-10 MED ORDER — SORBITOL 70 % SOLN
30.0000 mL | Freq: Once | Status: DC
  Filled 2023-07-10: qty 30

## 2023-07-10 MED ORDER — HYDRALAZINE HCL 25 MG PO TABS
25.0000 mg | ORAL_TABLET | Freq: Three times a day (TID) | ORAL | 0 refills | Status: AC
  Filled 2023-07-10: qty 90, 30d supply, fill #0

## 2023-07-10 MED ORDER — OXYCODONE HCL ER 10 MG PO T12A
10.0000 mg | EXTENDED_RELEASE_TABLET | Freq: Two times a day (BID) | ORAL | 0 refills | Status: DC
  Filled 2023-07-10: qty 14, 7d supply, fill #0

## 2023-07-10 MED ORDER — FE FUM-VIT C-VIT B12-FA 460-60-0.01-1 MG PO CAPS
1.0000 | ORAL_CAPSULE | Freq: Every day | ORAL | 0 refills | Status: DC
  Filled 2023-07-10: qty 30, 30d supply, fill #0

## 2023-07-10 MED ORDER — AMLODIPINE BESYLATE 5 MG PO TABS
5.0000 mg | ORAL_TABLET | Freq: Every day | ORAL | 0 refills | Status: AC
  Filled 2023-07-10: qty 30, 30d supply, fill #0

## 2023-07-10 MED ORDER — POLYETHYLENE GLYCOL 3350 17 GM/SCOOP PO POWD
17.0000 g | Freq: Every day | ORAL | 0 refills | Status: DC
  Filled 2023-07-10: qty 238, 14d supply, fill #0

## 2023-07-10 MED ORDER — MORPHINE SULFATE ER 15 MG PO TBCR
15.0000 mg | EXTENDED_RELEASE_TABLET | Freq: Two times a day (BID) | ORAL | Status: DC
  Administered 2023-07-10 – 2023-07-13 (×6): 15 mg via ORAL
  Filled 2023-07-10 (×6): qty 1

## 2023-07-10 MED ORDER — POLYETHYLENE GLYCOL 3350 17 G PO PACK
17.0000 g | PACK | Freq: Once | ORAL | Status: AC
  Administered 2023-07-10: 17 g via ORAL
  Filled 2023-07-10: qty 1

## 2023-07-10 MED ORDER — MORPHINE SULFATE ER 15 MG PO TBCR
15.0000 mg | EXTENDED_RELEASE_TABLET | Freq: Two times a day (BID) | ORAL | 0 refills | Status: DC
  Filled 2023-07-10: qty 14, 7d supply, fill #0

## 2023-07-10 NOTE — Plan of Care (Signed)
  Problem: Consults Goal: RH LIMB LOSS PATIENT EDUCATION Description: Description: See Patient Education module for eduction specifics. Outcome: Progressing Goal: Nutrition Consult-if indicated Outcome: Progressing Goal: Diabetes Guidelines if Diabetic/Glucose > 140 Description: If diabetic or lab glucose is > 140 mg/dl - Initiate Diabetes/Hyperglycemia Guidelines & Document Interventions  Outcome: Progressing   Problem: RH BOWEL ELIMINATION Goal: RH STG MANAGE BOWEL WITH ASSISTANCE Description: STG Manage Bowel with toileting Assistance. Outcome: Progressing Goal: RH STG MANAGE BOWEL W/MEDICATION W/ASSISTANCE Description: STG Manage Bowel with Medication with mod I Assistance. Outcome: Progressing   Problem: RH BLADDER ELIMINATION Goal: RH STG MANAGE BLADDER WITH ASSISTANCE Description: STG Manage Bladder With min Assistance Outcome: Progressing Goal: RH STG MANAGE BLADDER WITH EQUIPMENT WITH ASSISTANCE Description: STG Manage Bladder With Equipment With min Assistance Outcome: Progressing   Problem: RH SKIN INTEGRITY Goal: RH STG SKIN FREE OF INFECTION/BREAKDOWN Description: Manage skin w min assist Outcome: Progressing   Problem: RH SAFETY Goal: RH STG ADHERE TO SAFETY PRECAUTIONS W/ASSISTANCE/DEVICE Description: STG Adhere to Safety Precautions With cues  Assistance/Device. Outcome: Progressing   Problem: RH PAIN MANAGEMENT Goal: RH STG PAIN MANAGED AT OR BELOW PT'S PAIN GOAL Description: Pain < 4 with prns Outcome: Progressing   Problem: RH KNOWLEDGE DEFICIT LIMB LOSS Goal: RH STG INCREASE KNOWLEDGE OF SELF CARE AFTER LIMB LOSS Description: Patient and family will be able to manage care at discharge using educational resources for medication, skin care and dietary modification independently Outcome: Progressing   Problem: RH SKIN INTEGRITY Goal: RH STG SKIN FREE OF INFECTION/BREAKDOWN Outcome: Progressing

## 2023-07-10 NOTE — Consult Note (Signed)
 WOC Nurse wound follow up Wound type:Right hip disarticulation surgical site.  NPWT (VAC) dressing change.  Patient was given OXY-IR  earlier and can now have Tylenol .  Measurement: Right trunk:  16 cm proximal to distal, suture line  3 O'clock 9 cm suture line 9 o'clock -5 cm suture line staple line proximal to surgical site.  Wound azi:pwujru.  Some edges are not approximated, and are clean and pink, see photo  Drainage (amount, consistency, odor) none noted today.  Periwound: intact  suture line at 9 o'clock ends 2 cm from labia.  Barrier ring placed at this site to promote seal and at the distal end as well.   Dressing procedure/placement/frequency: cleanse wound with NS and pat dry. Apply barrier ring to edges as needed to promote seal.  3 pieces black foam applied along suture line.  Covered with drape and Trac pad. Seal achieved at 125 mmhg,   Wound azi:Dlulmz line with some separation that is clean and red.  Drainage (amount, consistency, odor) None noted Periwound: Anatomically, labia in close proximity to suture line.  Barrier ring used to promote seal.   Dressing procedure/placement/frequency:3 pieces black foam applied. Barrier ring to periwound edges. Covered with drape and TRAC pad.   Will follow.  See MOnday and Thursday.   Darice Cooley MSN, RN, FNP-BC CWON Wound, Ostomy, Continence Nurse [facility] [phone] Pager [phone]

## 2023-07-10 NOTE — Progress Notes (Signed)
 Oxycontin  changed MS contin  per insurance preference--will try during hospitalization to see if effective.

## 2023-07-10 NOTE — Progress Notes (Signed)
 PROGRESS NOTE   Subjective/Complaints: Pt reports pain is doing great- very little pain when takes her Oxy.  Usually <3/10 now.  LBM 2-3 days ago- needs to have BM.     ROS:  Per HPI  Pt denies SOB, abd pain, CP, N/V/C/D, and vision changes    Objective:   No results found.  Recent Labs    07/10/23 0500  WBC 7.7  HGB 8.8*  HCT 28.3*  PLT 293    Recent Labs    07/10/23 0500  NA 133*  K 4.0  CL 101  CO2 23  GLUCOSE 97  BUN 23  CREATININE 0.57  CALCIUM  8.8*     Intake/Output Summary (Last 24 hours) at 07/10/2023 0955 Last data filed at 07/10/2023 0814 Gross per 24 hour  Intake 480 ml  Output 1325 ml  Net -845 ml        Physical Exam: Vital Signs Blood pressure (!) 148/60, pulse 66, temperature 98 F (36.7 C), temperature source Oral, resp. rate 18, weight 59.7 kg, SpO2 100%.       General: awake, alert, appropriate, sitting up in bed; spoke via tele-interpretor; NAD HENT: conjugate gaze; oropharynx moist CV: regular rate and rhythm; no JVD Pulmonary: CTA B/L; no W/R/R- good air movement GI: soft, NT, ND, (+)BS- slightly hypoactive- flat Psychiatric: appropriate- interactive Neurological: Ox3  Genitourinary:    Comments: Foley in place with cloudy urine  MSK- R hip disarticulation with dressing C/D/I before therapy Musculoskeletal:  VAC in plac eon R hip disarticulation- with 50cc in VAC of sanguinous drainage.     Cervical back: Neck supple. No tenderness.     Comments: Right groin with dry padded dressing and appears to have old serosanguinous drainage. Dressing C/D/I and dated 6/30  Ue's 5-/5 B/L throughout LLE- 5-/5 in L leg- throughout RLE- Hip disarticulation- no leg at all to test  stable 7/6   Skin:    General: Skin is warm and dry.     Comments: Incisions right and left shoulder and left groin puckered with skin glue.     Neurological:     General: No focal deficit  present.     Mental Status: She is alert and oriented to person, place, and time.     Comments: Flat affect. Decreased to light touch in L foot/ankle, otherwise intact to light touch in all 3 limbs   Assessment/Plan: 1. Functional deficits which require 3+ hours per day of interdisciplinary therapy in a comprehensive inpatient rehab setting. Physiatrist is providing close team supervision and 24 hour management of active medical problems listed below. Physiatrist and rehab team continue to assess barriers to discharge/monitor patient progress toward functional and medical goals  Care Tool:  Bathing          Body parts n/a: Right lower leg, Right upper leg   Bathing assist Assist Level: Supervision/Verbal cueing (bed level)     Upper Body Dressing/Undressing Upper body dressing   What is the patient wearing?: Pull over shirt    Upper body assist Assist Level: Set up assist    Lower Body Dressing/Undressing Lower body dressing      What is the patient wearing?:  Pants     Lower body assist Assist for lower body dressing: Supervision/Verbal cueing (bed level)     Toileting Toileting    Toileting assist Assist for toileting: Contact Guard/Touching assist     Transfers Chair/bed transfer  Transfers assist     Chair/bed transfer assist level: Contact Guard/Touching assist     Locomotion Ambulation   Ambulation assist      Assist level: Supervision/Verbal cueing Assistive device: Walker-rolling Max distance: 20'   Walk 10 feet activity   Assist     Assist level: Supervision/Verbal cueing Assistive device: Walker-rolling   Walk 50 feet activity   Assist Walk 50 feet with 2 turns activity did not occur: Safety/medical concerns (fatigue, poor endurance)         Walk 150 feet activity   Assist Walk 150 feet activity did not occur: Safety/medical concerns (fatigue, poor endurance)         Walk 10 feet on uneven surface   activity   Assist Walk 10 feet on uneven surfaces activity did not occur: Safety/medical concerns (fatigue, poor endurance)         Wheelchair     Assist Is the patient using a wheelchair?: Yes Type of Wheelchair: Manual    Wheelchair assist level: Dependent - Patient 0% Max wheelchair distance: 250'    Wheelchair 50 feet with 2 turns activity    Assist    Wheelchair 50 feet with 2 turns activity did not occur: Safety/medical concerns   Assist Level: Dependent - Patient 0%   Wheelchair 150 feet activity     Assist      Assist Level: Dependent - Patient 0%   Blood pressure (!) 148/60, pulse 66, temperature 98 F (36.7 C), temperature source Oral, resp. rate 18, weight 59.7 kg, SpO2 100%.  Medical Problem List and Plan: 1. 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   2.  Antithrombotics: -DVT/anticoagulation:  Pharmaceutical: Lovenox               -antiplatelet therapy: N/A 3. Pain Management: Oxycodone  prn. D/c IV dilaudid . Increase gabapentin  to TID             --tylenol  prn.   7/2- pain overall well controlled per pt- will con't regimen  7/3- pain worse this AM, but no pain meds for 8 hours-   ContinueGabapentin to 400 mg TID- Also increased Oxy to 10 mg (from 5mg ) and q3 hours prn  7/7- pt reports pain is great- very little- con't regimen 4. Mood/Behavior/Sleep: LCSW to follow for evaluation and support.              -antipsychotic agents: N/A  5. Neuropsych/cognition: This patient appears to be capable of making decisions on her own behalf.  6.  Skin/Wound Care: Routine pressure relief measures. Continue Vitamin C  and protein supplements.  --daily monitoring of right axilla and Right groin wound  for healing.  7/2- VAC placed today to try and decrease risk of wound contamination- also significant drainage, to help  with this-  7/7- last changed last week 7. Fluids/Electrolytes/Nutrition: Strict I/O with daily weights. Continue Ensure bid.  --Will also add juven/beneprotein to promote wound healing.   8.  MRSA bacteremia: To complete 6 week antibiotic course now on Linzolid 07/13/23 (confirmed w/ID NP that no further suppression needed), continue Linezolid              --continue to monitor CK--last 1,132 on 06/16  7/2- will recheck CK on Monday  7/7- CK 10- doing even better 9.  HTN: Monitor BP TID. continue amlodipine , hydralazine  TID and metoprolol --BP trending low today  7/2- BP doing better- controlled, but not really soft- con't to monitor and con't regimen             --will set parameters on meds to avoid orthostatic drop.   7/4- BP doing better 120's-135 systolic- con't regimen  Decrease amlodipine  to 5mg  given diastolic hypotension  7/7- BP running mid 140s now with reduction in Norvasc - if doesn't improve, will increase Norvasc  again.  10. Acute blood loss anemia:  Last transfused with one unit 06/29. Hgb 6.9-->9.0 today             --check anemia panel. Iron supplement started on 06/29  7.2- Hb up to 9.4 today- in spite of drainage- con't to monitor  7/4- Hb 8.7- might have dropped slightly- however VAC had 250cc in VAC (200cc in last 24 hours), so this fits- will rechekc Monday/Thursday to monitor- changed to CbC-with diff  7/7- Hb 8.8 - up slightly-  11.  Leucocytosis: WBC trending down from 21.6 post surgery-->13.1              --recheck white count in am.  7/2- Pt's WBC down to 10k from 13k- so doing better  7/4- WBC 7.7k  7/7- WBC 7.7 12. Hypoxia: --Will check CXR to follow up on pulmonary edema/intermittent  tachypnea.              --note intermittent drop to low 80's last night/this am.  7/4- no recent drops  13.  Hyponatremia: Recheck in am. Wt 130--133 since hip disarticulation.              --monitor for signs of overload/likely multifactorial.   7/4- Na 132  14.  Severe protein Malnutrition: Monitor daily weighs--last alb @ 1.9.  Prostat and Juven also added. - check pre-albumin  level in am. Recheck Mg level in am.  7/2- Mg 2.0- as well as Albumin  1.9-  -ALT also slightly elevated at 51-will reduce Tylenol  to help- to 325 mg q 4 hours prn  7/7- Albumin  up to 2.0 15.  Pelvic prolapse: Pessary cleaned on 06/30/23.  Last dose diflucan  07/01   16. Urinary retention/Foley  7/3- high risk of VAC not working/keeping seal- so d/w with entire Team, will keep Foley for now - since put out 50-75cc in last 18 hours. So we decided although risk of CAUTI from foley, don't want high risk of sepsis-from bladder/bowel  Continue foley  7/7- con't foley since still having drainage.  17. Transaminitis  7/2- ALT 51- will reduce Tylenol  amount to reduce ALT levels. Will recheck next Monday    7/7- LFTs down to normal- 20/29- resolved 18 Constipation  7/7- LBM 2+ days ago- will give extra dose of Miralax - if no result,s will give Sorbitol  30cc.    I spent a total of 35   minutes on total care today- >50% coordination of care- due to  D/w teleinterpretor, and nursing about  bowels- also review of labs and vitals and bowels/bladder  LOS: 6 days A FACE TO FACE EVALUATION WAS PERFORMED  Duston Smolenski 07/10/2023, 9:55 AM

## 2023-07-10 NOTE — Plan of Care (Signed)
  Problem: Consults Goal: RH LIMB LOSS PATIENT EDUCATION Description: Description: See Patient Education module for eduction specifics. Outcome: Progressing Goal: Nutrition Consult-if indicated Outcome: Progressing Goal: Diabetes Guidelines if Diabetic/Glucose > 140 Description: If diabetic or lab glucose is > 140 mg/dl - Initiate Diabetes/Hyperglycemia Guidelines & Document Interventions  Outcome: Progressing   Problem: RH BOWEL ELIMINATION Goal: RH STG MANAGE BOWEL WITH ASSISTANCE Description: STG Manage Bowel with toileting Assistance. Outcome: Progressing Goal: RH STG MANAGE BOWEL W/MEDICATION W/ASSISTANCE Description: STG Manage Bowel with Medication with mod I Assistance. Outcome: Progressing   Problem: RH BLADDER ELIMINATION Goal: RH STG MANAGE BLADDER WITH ASSISTANCE Description: STG Manage Bladder With min Assistance Outcome: Progressing Goal: RH STG MANAGE BLADDER WITH EQUIPMENT WITH ASSISTANCE Description: STG Manage Bladder With Equipment With min Assistance Outcome: Progressing   Problem: RH SKIN INTEGRITY Goal: RH STG SKIN FREE OF INFECTION/BREAKDOWN Description: Manage skin w min assist Outcome: Progressing   Problem: RH SAFETY Goal: RH STG ADHERE TO SAFETY PRECAUTIONS W/ASSISTANCE/DEVICE Description: STG Adhere to Safety Precautions With cues  Assistance/Device. Outcome: Progressing   Problem: RH PAIN MANAGEMENT Goal: RH STG PAIN MANAGED AT OR BELOW PT'S PAIN GOAL Description: Pain < 4 with prns Outcome: Progressing   Problem: RH KNOWLEDGE DEFICIT LIMB LOSS Goal: RH STG INCREASE KNOWLEDGE OF SELF CARE AFTER LIMB LOSS Description: Patient and family will be able to manage care at discharge using educational resources for medication, skin care and dietary modification independently Outcome: Progressing   Problem: RH SKIN INTEGRITY Goal: RH STG SKIN FREE OF INFECTION/BREAKDOWN Outcome: Progressing   Problem: Consults Goal: RH LIMB LOSS PATIENT  EDUCATION Description: Description: See Patient Education module for eduction specifics. Outcome: Progressing   Problem: Consults Goal: Nutrition Consult-if indicated Outcome: Progressing   Problem: Consults Goal: Diabetes Guidelines if Diabetic/Glucose > 140 Description: If diabetic or lab glucose is > 140 mg/dl - Initiate Diabetes/Hyperglycemia Guidelines & Document Interventions  Outcome: Progressing   Problem: RH BOWEL ELIMINATION Goal: RH STG MANAGE BOWEL WITH ASSISTANCE Description: STG Manage Bowel with toileting Assistance. Outcome: Progressing   Problem: RH BOWEL ELIMINATION Goal: RH STG MANAGE BOWEL W/MEDICATION W/ASSISTANCE Description: STG Manage Bowel with Medication with mod I Assistance. Outcome: Progressing   Problem: RH BLADDER ELIMINATION Goal: RH STG MANAGE BLADDER WITH ASSISTANCE Description: STG Manage Bladder With min Assistance Outcome: Progressing   Problem: RH BLADDER ELIMINATION Goal: RH STG MANAGE BLADDER WITH EQUIPMENT WITH ASSISTANCE Description: STG Manage Bladder With Equipment With min Assistance Outcome: Progressing

## 2023-07-10 NOTE — Progress Notes (Signed)
 Occupational Therapy Session Note  Patient Details  Name: Katherine Archer MRN: [id number] Date of Birth: [birth year]  Today's Date: 07/10/2023 OT Individual Time: 1115-1200 & 1400-1443 OT Individual Time Calculation (min): 45 min & 43 min   Short Term Goals: Week 1:  OT Short Term Goal 1 (Week 1): STGs=LTGs due to patient's estimated length of stay.  Skilled Therapeutic Interventions/Progress Updates:      Therapy Documentation Precautions:  Precautions Precautions: Fall Recall of Precautions/Restrictions: Intact Precaution/Restrictions Comments: Contact precs;watch BP, wound vac right hip Restrictions Weight Bearing Restrictions Per Provider Order: No RLE Weight Bearing Per Provider Order: Weight bearing as tolerated Other Position/Activity Restrictions: Rt hip disarticulation Session 1 General:  Pt supine in bed upon OT arrival, agreeable to OT session. Pt sleeping upon arrival with increased fatigue this date, pt requested for bed level therapy.  Pain:  5/10 pain reported in R hip, activity, intermittent rest breaks, distractions provided for pain management, pt reports tolerable to proceed.   Exercises: Pt completed the following exercise circuit in order to improve functional activity, strength and endurance to prepare for ADLs such as bathing. Pt completed the following exercises in supine position with 3x10 repetitions on each exercise with green theraband: -bicep curls -shoulder abduction -External rotation -shoulder flexion  Other Treatments: OT discussing D/C with pt in order to prepare. OT discussing need for family education with granddaughter regarding wound care, foley care and PICC line care. OT messaging team regarding matter.   Pt supine in bed with bed alarm activated, 2 bed rails up, call light within reach and 4Ps assessed.   Session 2 General: Pt supine in bed upon OT arrival, agreeable to OT session.  Pain: unrated pain reported in Rt hip, activity,  intermittent rest breaks, distractions provided for pain management, pt reports tolerable to proceed.   ADL: OT providing skilled intervention on ADL retraining in order to increase independence with tasks and increase activity tolerance. Pt completed the following tasks at the current level of assist: Bed mobility: set up from flat bed supine><EOB with increased time d/t pain Transfers: SBA bed><W/C with RW stand step transfer, no LOB, also able to stand unsupported at CGA to fix pants   Exercises: Pt completed 10 minutes of arm bike, 5 min forward, 5 min backward in order to increase BUE/BLEstrength and endurance in preparation for increased independence in ADLs. Rest break after 5 min, on level 6 resistance.    Pt bed alarm activated, 2 bed rails up, call light within reach and 4Ps assessed.   Therapy/Group: Individual Therapy  Camie Hoe, OTD, OTR/L 07/10/2023, 3:55 PM

## 2023-07-10 NOTE — Progress Notes (Signed)
 Physical Therapy Session Note  Patient Details  Name: Katherine Archer MRN: [id number] Date of Birth: [birth year]  Today's Date: 07/10/2023 PT Individual Time: 0930-1045 PT Individual Time Calculation (min): 75 min   Short Term Goals: Week 1:  PT Short Term Goal 1 (Week 1): STG=LTG  Skilled Therapeutic Interventions/Progress Updates:    Pt in bed when PT arrived, pt agreeable for PT session this morning. Pt demonstrate mod indep with bed<->EOB, bed<->w/c transfers, c/o soreness to right hip with movement. Pt states her right is getting better overall.  Pt reports she did not sleep well last night, but this is not unusual, per pt.  Session focusing on endurance, gait distance, LE strengthening, safe transfers to safely perform in home environment.  Pt wheeled to main gym. Therex in w/c with 1# wts: marching 2x20, LAQs x20, heel raises x20; increased resistance to 2#: marching, LAQs x10 each. Gait with RW, PT pulling w/c behind 20', 25'. Pt requires intermittent rest breaks throughout due to fatigue.  Pt refuses further tx due to fatigue with 15 minutes left.  Pt transferred into bed with mod indep using bed railing, able to position self up towards [facility], all items within reach.    Therapy Documentation Precautions:  Precautions Precautions: Fall Recall of Precautions/Restrictions: Intact Precaution/Restrictions Comments: Contact precs;watch BP, wound vac right hip Restrictions Weight Bearing Restrictions Per Provider Order: No RLE Weight Bearing Per Provider Order: Weight bearing as tolerated Other Position/Activity Restrictions: Rt hip disarticulation   Pain: Pain Assessment Pain Scale: Faces Faces Pain Scale: Hurts a little bit Pain Intervention(s): Medication (See eMAR)     Therapy/Group: Individual Therapy  [name] Fast 07/10/2023, 12:02 PM

## 2023-07-10 NOTE — Progress Notes (Signed)
 Physical Therapy Session Note  Patient Details  Name: Katherine Archer MRN: [id number] Date of Birth: 1955-06-11  Today's Date: 07/10/2023 PT Individual Time: 1050-1115 PT Individual Time Calculation (min): 25 min   Short Term Goals: Week 1:  PT Short Term Goal 1 (Week 1): STG=LTG  Skilled Therapeutic Interventions/Progress Updates:    Pt presents in room in bed, asleep, awakens slowly, agreeable to bedside PT session with pt reporting significant fatigue. Session focused on therapeutic activities for tolerance to upright, transfer training, and RW management for transfers and gait in narrow spaces. Ipad interpreter used throughout session. Pt completes bed mobility with supervision, increased time to complete secondary to fatigue. Pt completes sit to stand with x2, reporting dizziness and mild headache. Vitals assessed upon return to sitting with pt demo BP 134/64, HR 71. Pt then completes 4x6' side stepping with RW, increased time to complete seated rest break following 2nd trial. Therapist used therapeutic use of self to engage participation with session with pt demonstrating improving alertness during session but continues to demo significant fatigue. Pt returns to supine with supervision where she remains with all needs within reach, call light in place, and bed alarm activated at end of session.  Therapy Documentation Precautions:  Precautions Precautions: Fall Recall of Precautions/Restrictions: Intact Precaution/Restrictions Comments: Contact precs;watch BP, wound vac right hip Restrictions Weight Bearing Restrictions Per Provider Order: No RLE Weight Bearing Per Provider Order: Weight bearing as tolerated Other Position/Activity Restrictions: Rt hip disarticulation    Therapy/Group: Individual Therapy  Reche Ohara PT, DPT 07/10/2023, 4:03 PM

## 2023-07-11 ENCOUNTER — Other Ambulatory Visit (HOSPITAL_COMMUNITY): Payer: Self-pay

## 2023-07-11 MED ORDER — ASPIRIN 81 MG PO TBEC
81.0000 mg | DELAYED_RELEASE_TABLET | Freq: Every day | ORAL | Status: DC
  Administered 2023-07-11 – 2023-07-13 (×3): 81 mg via ORAL
  Filled 2023-07-11 (×3): qty 1

## 2023-07-11 NOTE — Progress Notes (Signed)
 Physical Therapy Session Note  Patient Details  Name: Katherine Archer MRN: [id number] Date of Birth: [birth year]  Today's Date: 07/11/2023 PT Individual Time: 1300-1345 PT Individual Time Calculation (min): 45 min   Short Term Goals: Week 1:  PT Short Term Goal 1 (Week 1): STG=LTG  Skilled Therapeutic Interventions/Progress Updates:    Pt in bed, agreeable for session, interpreter present. Pt c/o right hip pain, PT notified RN. Pt performed supine SLR, hip abd/add, KTC 2x15, sit to stand with RW heel raises 2x10, mini squats x10 cueing to correct technique, pt amb with supervision x30' max due to fatigue, returned to bed supine post tx with all items within reach.   Therapy Documentation Precautions:  Precautions Precautions: Fall Recall of Precautions/Restrictions: Intact Precaution/Restrictions Comments: Contact precs;watch BP, wound vac right hip Restrictions Weight Bearing Restrictions Per Provider Order: No RLE Weight Bearing Per Provider Order: Weight bearing as tolerated Other Position/Activity Restrictions: Rt hip disarticulation    Pain: right hip pain     Therapy/Group: Individual Therapy  [name] Fast 07/11/2023, 3:59 PM

## 2023-07-11 NOTE — Progress Notes (Signed)
 Patient ID: Katherine Archer, female   DOB: 07/22/1955, 68 y.o.   MRN: [id number]  Have reached out to [facility] regarding pt's tentative discharge date of 7/10. They will be providing PT OT RN. Pt aware of discharge date of 7/10 and goals of mod/I-supervision level. Will try to get family in for nursing education prior to discharge. Pt has all DME from last admit. Wound vac in SW office and will place in her room.

## 2023-07-11 NOTE — Progress Notes (Signed)
 Physical Therapy Session Note  Patient Details  Name: Katherine Archer MRN: [id number] Date of Birth: 28-Jun-1955  Today's Date: 07/11/2023 PT Individual Time: 0930-1030 PT Individual Time Calculation (min): 60 min   Short Term Goals: Week 1:  PT Short Term Goal 1 (Week 1): STG=LTG  Skilled Therapeutic Interventions/Progress Updates:    Pt in bed when PT arrived. Pt wound vac not working, tech to arrive later today to fix.  Pt agreeable for PT session, interpretor present.  Pt performed supine SLR 2x20, hip abd/add 2x20, KTC with green TB x20, leg presses with green TB x20, ankle DF/PF with green TB x20, bent knee abd/add with green TB x20 each, sitting EOB with UE support LAQ x20, sit to stand to RW heel raises x15, mini squats x10. Pt required multiple breaks throughout session due to fatigue.  Pt in bed post session with items within reach.  Therapy Documentation Precautions:  Precautions Precautions: Fall Recall of Precautions/Restrictions: Intact Precaution/Restrictions Comments: Contact precs;watch BP, wound vac right hip Restrictions Weight Bearing Restrictions Per Provider Order: No RLE Weight Bearing Per Provider Order: Weight bearing as tolerated Other Position/Activity Restrictions: Rt hip disarticulation  Pain: Pain Assessment Pain Scale: 0-10 Pain Score: 6  Pain Location: Hip     Therapy/Group: Individual Therapy  [name] Fast 07/11/2023, 12:44 PM

## 2023-07-11 NOTE — Progress Notes (Signed)
 CVAD removed per protocol per MD order. Manual pressure applied for 3 mins. Vaseline gauze, gauze, and Tegaderm applied over insertion site. No bleeding or swelling noted. Instructed patient to remain in bed for thirty mins. Educated patient about S/S of infection and when to call MD; no heavy lifting or pressure on R side for 24 hours; keep dressing dry and intact for 24 hours. Pt verbalized comprehension.

## 2023-07-11 NOTE — Progress Notes (Signed)
 PROGRESS NOTE   Subjective/Complaints:  Pt reports LBM yesterday.  After meds.  VAC came off yesterday- wasn't holding suction- WOC coming back today to put it back on.  Pain well controlled with current regimen, even with change to MS Contin .    ROS:  Per HPI   Pt denies SOB, abd pain, CP, N/V/C/D, and vision changes   Objective:   No results found.  Recent Labs    07/10/23 0500  WBC 7.7  HGB 8.8*  HCT 28.3*  PLT 293    Recent Labs    07/10/23 0500  NA 133*  K 4.0  CL 101  CO2 23  GLUCOSE 97  BUN 23  CREATININE 0.57  CALCIUM  8.8*     Intake/Output Summary (Last 24 hours) at 07/11/2023 0904 Last data filed at 07/11/2023 0300 Gross per 24 hour  Intake 236 ml  Output 3150 ml  Net -2914 ml        Physical Exam: Vital Signs Blood pressure (!) 151/65, pulse 72, temperature 98.4 F (36.9 C), temperature source Oral, resp. rate 20, weight 53.5 kg, SpO2 100%.        General: awake, alert, appropriate, sitting up in w/c after transferring from bed with RW and OT; interpretor in room; NAD HENT: conjugate gaze; oropharynx moist CV: regular rate and rhythm; no JVD Pulmonary: CTA B/L; no W/R/R- good air movement GI: soft, NT, ND, (+)BS Psychiatric: appropriate- more interactive  Neurological: Ox3   Genitourinary:    Comments: Foley in place with less cloudy urine  MSK- R hip disarticulation with dressing C/D/I before therapy Musculoskeletal:  VAC off- C/D/I dressing placed 2-3 hours ago on R hip disarticulation    Cervical back: Neck supple. No tenderness.     Comments: Right groin with dry padded dressing and appears to have old serosanguinous drainage. Dressing C/D/I and dated 6/30  Ue's 5-/5 B/L throughout LLE- 5-/5 in L leg- throughout RLE- Hip disarticulation- no leg at all to test  stable 7/6   Skin:    General: Skin is warm and dry.     Comments: Incisions right and left shoulder  and left groin puckered with skin glue.     Neurological:     General: No focal deficit present.     Mental Status: She is alert and oriented to person, place, and time.     Comments: Flat affect. Decreased to light touch in L foot/ankle, otherwise intact to light touch in all 3 limbs   Assessment/Plan: 1. Functional deficits which require 3+ hours per day of interdisciplinary therapy in a comprehensive inpatient rehab setting. Physiatrist is providing close team supervision and 24 hour management of active medical problems listed below. Physiatrist and rehab team continue to assess barriers to discharge/monitor patient progress toward functional and medical goals  Care Tool:  Bathing          Body parts n/a: Right lower leg, Right upper leg   Bathing assist Assist Level: Supervision/Verbal cueing (bed level)     Upper Body Dressing/Undressing Upper body dressing   What is the patient wearing?: Pull over shirt    Upper body assist Assist Level: Set up assist  Lower Body Dressing/Undressing Lower body dressing      What is the patient wearing?: Pants     Lower body assist Assist for lower body dressing: Supervision/Verbal cueing (bed level)     Toileting Toileting    Toileting assist Assist for toileting: Contact Guard/Touching assist     Transfers Chair/bed transfer  Transfers assist     Chair/bed transfer assist level: Independent with assistive device Chair/bed transfer assistive device: Bedrails   Locomotion Ambulation   Ambulation assist      Assist level: Supervision/Verbal cueing Assistive device: Walker-rolling Max distance: 25   Walk 10 feet activity   Assist     Assist level: Supervision/Verbal cueing Assistive device: Walker-rolling   Walk 50 feet activity   Assist Walk 50 feet with 2 turns activity did not occur: Safety/medical concerns (fatigue, poor endurance)         Walk 150 feet activity   Assist Walk 150 feet  activity did not occur: Safety/medical concerns (fatigue, poor endurance)         Walk 10 feet on uneven surface  activity   Assist Walk 10 feet on uneven surfaces activity did not occur: Safety/medical concerns (fatigue, poor endurance)         Wheelchair     Assist Is the patient using a wheelchair?: Yes Type of Wheelchair: Manual    Wheelchair assist level: Dependent - Patient 0% Max wheelchair distance: 250'    Wheelchair 50 feet with 2 turns activity    Assist    Wheelchair 50 feet with 2 turns activity did not occur: Safety/medical concerns   Assist Level: Dependent - Patient 0%   Wheelchair 150 feet activity     Assist      Assist Level: Dependent - Patient 0%   Blood pressure (!) 151/65, pulse 72, temperature 98.4 F (36.9 C), temperature source Oral, resp. rate 20, weight 53.5 kg, SpO2 100%.  Medical Problem List and Plan: 1. 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   2.  Antithrombotics: -DVT/anticoagulation:  Pharmaceutical: Lovenox               -antiplatelet therapy: 7/8- added ASA back 3. Pain Management: Oxycodone  prn. D/c IV dilaudid . Increase gabapentin  to TID             --tylenol  prn.   7/2- pain overall well controlled per pt- will con't regimen  7/3- pain worse this AM, but no pain meds for 8 hours-   ContinueGabapentin to 400 mg TID- Also increased Oxy to 10 mg (from 5mg ) and q3 hours prn  7/7- pt reports pain is great- very little- con't regimen  7/8- no change with Change to MS Contin  long acting  pain meds 4. Mood/Behavior/Sleep: LCSW to follow for evaluation and support.              -antipsychotic agents: N/A  5. Neuropsych/cognition: This patient appears to be capable of making decisions on her own behalf.  6. Skin/Wound  Care: Routine pressure relief measures. Continue Vitamin C  and protein supplements.  --daily monitoring of right axilla and Right groin wound  for healing.  7/2- VAC placed today to try and decrease risk of wound contamination- also significant drainage, to help with this-  7/7- last changed last week 7/8- VAC wouldn't stay suction yesterday- to put back on today by WOC 7. Fluids/Electrolytes/Nutrition: Strict I/O with daily weights. Continue Ensure bid.  --Will also add juven/beneprotein to promote wound healing.   8.  MRSA bacteremia: To complete 6 week antibiotic course now on Linzolid 07/13/23 (confirmed w/ID NP that no further suppression needed), continue Linezolid              --continue to monitor CK--last 1,132 on 06/16  7/2- will recheck CK on Monday  7/7- CK 10- doing even better 9.  HTN: Monitor BP TID. continue amlodipine , hydralazine  TID and metoprolol --BP trending low today  7/2- BP doing better- controlled, but not really soft- con't to monitor and con't regimen             --will set parameters on meds to avoid orthostatic drop.   7/4- BP doing better 120's-135 systolic- con't regimen  Decrease amlodipine  to 5mg  given diastolic hypotension  7/7- BP running mid 140s now with reduction in Norvasc - if doesn't improve, will increase Norvasc  again. 7/8- BP 151 systolic this AM  but 129 systolic last few checks- will monitor for changing meds 10. Acute blood loss anemia:  Last transfused with one unit 06/29. Hgb 6.9-->9.0 today             --check anemia panel. Iron supplement started on 06/29  7.2- Hb up to 9.4 today- in spite of drainage- con't to monitor  7/4- Hb 8.7- might have dropped slightly- however VAC had 250cc in VAC (200cc in last 24  hours), so this fits- will rechekc Monday/Thursday to monitor- changed to CbC-with diff  7/7- Hb 8.8 - up slightly-  11.  Leucocytosis: WBC trending down from 21.6 post surgery-->13.1              --recheck white count in am.  7/2- Pt's WBC down to 10k from 13k- so doing better  7/4- WBC 7.7k  7/7- WBC 7.7 12. Hypoxia: --Will check CXR to follow up on pulmonary edema/intermittent tachypnea.              --note intermittent drop to low 80's last night/this am.  7/4- no recent drops  13.  Hyponatremia: Recheck in am. Wt 130--133 since hip disarticulation.              --monitor for signs of overload/likely multifactorial.   7/4- Na 132  14.  Severe protein Malnutrition: Monitor daily weighs--last alb @ 1.9.  Prostat and Juven also added. - check pre-albumin  level in am. Recheck Mg level in am.  7/2- Mg 2.0- as well as Albumin  1.9-  -ALT also slightly elevated at 51-will reduce Tylenol  to help- to 325 mg q 4 hours prn  7/7- Albumin  up to 2.0 15.  Pelvic prolapse: Pessary cleaned on 06/30/23.  Last dose diflucan  07/01   16. Urinary retention/Foley  7/3- high risk of VAC not working/keeping seal- so d/w with entire Team, will keep Foley for now - since put out 50-75cc in last 18 hours. So we decided although risk of CAUTI from foley, don't want high risk of sepsis-from bladder/bowel  Continue foley  7/8- con't foley since still having drainage/ esp now that VAC came off- .  17. Transaminitis  7/2- ALT 51- will reduce Tylenol  amount to reduce ALT levels. Will recheck next Monday    7/7- LFTs down to normal- 20/29- resolved 18 Constipation  7/7- LBM 2+ days ago- will give extra dose of Miralax - if no result,s will give Sorbitol  30cc.    I spent a total of 43   minutes on total care today- >50% coordination of care- due to  D/w pt via interpretor, and with OT- as well as team conference today- also reviewed notes including PA note.   LOS: 7 days A FACE TO FACE EVALUATION WAS  PERFORMED  Calvyn Kurtzman 07/11/2023, 9:04 AM

## 2023-07-11 NOTE — Consult Note (Signed)
 WOC Nurse wound follow up WOC team received message that wound VAC lost suction and dressing removed and replaced with a dry dressing.    Wound type: surgical Measurement/wound bed: staples approximated with some areas not approximated at center of wound, two suture lines measuring 24 cm and 16 cm, noted redness mid wound Drainage (amount, consistency, odor) serosanguinous in cannister, noted purulent drainage mid wound  Periwound: intact, erythema mid wound surrounding suture line Dressing procedure/placement/frequency: Removed old dry dressing  Filled wound with   __3__ piece of black foam along suture line, barrier rings placed  at edges of wound to promote seal. Sealed NPWT dressing at HG Patient tolerated procedure well.  Attending and Dr Harden notified of erythema and drainage findings.  WOC nurse will continue to provide NPWT dressing changed due to the complexity of the dressing change.   Thank you,  Doyal Polite, RN, MSN, [facility] WOC Team

## 2023-07-11 NOTE — Progress Notes (Signed)
 Ok to resume ASA for PAD per Dr. Cornelio.  Sergio Batch, PharmD, BCIDP, AAHIVP, CPP Infectious Disease Pharmacist 07/11/2023 8:21 AM

## 2023-07-11 NOTE — Progress Notes (Signed)
  Occupational Therapy Session Note  Patient Details  Name: Katherine Archer MRN: [id number] Date of Birth: March 04, 1955  Today's Date: 07/11/2023 OT Individual Time: 0805-0905 & 1350-1410 OT Individual Time Calculation (min): 60 min & 20 min Missed minute: pain;fatigue, see flowsheets   Short Term Goals: Week 1:  OT Short Term Goal 1 (Week 1): STGs=LTGs due to patient's estimated length of stay.  Skilled Therapeutic Interventions/Progress Updates:      Therapy Documentation Precautions:  Precautions Precautions: Fall Recall of Precautions/Restrictions: Intact Precaution/Restrictions Comments: Contact precs;watch BP, wound vac right hip Restrictions Weight Bearing Restrictions Per Provider Order: No RLE Weight Bearing Per Provider Order: Weight bearing as tolerated Other Position/Activity Restrictions: Rt hip disarticulation Session 1 General: Pt supine in bed upon OT arrival, agreeable to OT session. Wound vac unhooked when OT arrived, nsg and MD notified and was d/t wound vac not sealing well yesterday. Interpreter present. Session held outdoors for increased mood.   Pain:  2/10 pain reported in Rt hip, activity, intermittent rest breaks, distractions provided for pain management, pt reports tolerable to proceed.   Exercises:Pt demonstrated community mobility with W/C in order to prepare for community integration at D/C. Pt propelled in W/C requiring PRN rest breaks d/t fatigue in BUE. Pt practices maneuvering into elevators, pulling into table tops, maneuvering around obstacles and uneven terrain outdoors.  Pt completed the following exercise circuit in order to improve functional activity, strength and endurance to prepare for ADLs such as bathing. Pt completed the following exercises in W/C position with no noted LOB/SOB and 3x10 repetitions on each exercise: --forward rows   Other Treatments: OT providing therapeutic use of self in order to build rapport and discuss patient current  situation and goals for therapy.   Pt supine in bed with bed alarm activated, 2 bed rails up, call light within reach and 4Ps assessed. Nsg present for direct handoff.   Session 2 General: Pt supine in bed upon OT arrival, agreeable to OT session.  Pain: unrated pain reported in R hip, activity, intermittent rest breaks, distractions provided for pain management, pt reports tolerable to proceed.   ADL: OT discussed D/C planning information. OT educated/discussion pressure relief with ROHO cushion and pressure relief strategies when in bed and W/C once home.    Pt supine in bed with bed alarm activated, 2 bed rails up, call light within reach and 4Ps assessed.     Therapy/Group: Individual Therapy  Camie Hoe, OTD, OTR/L 07/11/2023, 4:41 PM

## 2023-07-12 ENCOUNTER — Other Ambulatory Visit (HOSPITAL_COMMUNITY): Payer: Self-pay

## 2023-07-12 MED ORDER — LINEZOLID 600 MG PO TABS
600.0000 mg | ORAL_TABLET | Freq: Two times a day (BID) | ORAL | Status: DC
  Administered 2023-07-12 – 2023-07-13 (×2): 600 mg via ORAL
  Filled 2023-07-12 (×3): qty 1

## 2023-07-12 MED ORDER — LINEZOLID 600 MG PO TABS
600.0000 mg | ORAL_TABLET | Freq: Two times a day (BID) | ORAL | 0 refills | Status: DC
  Filled 2023-07-12: qty 42, 21d supply, fill #0

## 2023-07-12 MED ORDER — DOXYCYCLINE HYCLATE 100 MG PO TABS
100.0000 mg | ORAL_TABLET | Freq: Two times a day (BID) | ORAL | 0 refills | Status: DC
  Filled 2023-07-12: qty 3, 2d supply, fill #0

## 2023-07-12 MED ORDER — OXYMETAZOLINE HCL 0.05 % NA SOLN
1.0000 | Freq: Two times a day (BID) | NASAL | Status: DC
  Administered 2023-07-12 (×2): 1 via NASAL
  Filled 2023-07-12: qty 30

## 2023-07-12 MED ORDER — OXYMETAZOLINE HCL 0.05 % NA SOLN
1.0000 | Freq: Two times a day (BID) | NASAL | 0 refills | Status: DC | PRN
  Filled 2023-07-12: qty 30, 150d supply, fill #0

## 2023-07-12 MED ORDER — ASPIRIN 81 MG PO TBEC
81.0000 mg | DELAYED_RELEASE_TABLET | Freq: Every day | ORAL | 12 refills | Status: AC
Start: 2023-07-13 — End: ?
  Filled 2023-07-12: qty 30, 30d supply, fill #0
  Filled 2023-08-16 (×2): qty 30, 30d supply, fill #1

## 2023-07-12 MED ORDER — OXYMETAZOLINE HCL 0.05 % NA SOLN
1.0000 | Freq: Two times a day (BID) | NASAL | Status: DC | PRN
  Filled 2023-07-12: qty 30

## 2023-07-12 NOTE — Progress Notes (Addendum)
 PROGRESS NOTE   Subjective/Complaints:  Pt reports pain 0/10 when takes prn pain meds.  Didn't sleep well though because frequent nosebleeds- was gushing at times- didn't sleep because scare would die form blood loss.   No more drainage from R hip disarticulation- per pt and VAC has no increased amount of drainage in VAC since Monday AM.   ROS:  Per HPI   Pt denies SOB, abd pain, CP, N/V/C/D, and vision changes    Objective:   No results found.  Recent Labs    07/10/23 0500  WBC 7.7  HGB 8.8*  HCT 28.3*  PLT 293    Recent Labs    07/10/23 0500  NA 133*  K 4.0  CL 101  CO2 23  GLUCOSE 97  BUN 23  CREATININE 0.57  CALCIUM  8.8*     Intake/Output Summary (Last 24 hours) at 07/12/2023 0846 Last data filed at 07/12/2023 0752 Gross per 24 hour  Intake 318 ml  Output 2275 ml  Net -1957 ml        Physical Exam: Vital Signs Blood pressure (!) 135/48, pulse 70, temperature 98.9 F (37.2 C), temperature source Oral, resp. rate 18, weight 53.5 kg, SpO2 100%.         General: awake, alert, appropriate, spoke with pt via tele-interpretor; sitting up in bed; NAD HENT: conjugate gaze; oropharynx moist- no signs of epistaxis on face/nose CV: regular rate and rhythm; no JVD Pulmonary: CTA B/L; no W/R/R- good air movement GI: soft, NT, ND, (+)BS Psychiatric: appropriate Neurological: Ox3  Genitourinary:    Comments: Foley in place with less cloudy urine  MSK- R hip disarticulation with dressing C/D/I before therapy Musculoskeletal:  VAC off- C/D/I dressing placed 2-3 hours ago on R hip disarticulation    Cervical back: Neck supple. No tenderness.     Comments: Right groin with dry padded dressing and appears to have old serosanguinous drainage. Dressing C/D/I and dated 6/30  Ue's 5-/5 B/L throughout LLE- 5-/5 in L leg- throughout RLE- Hip disarticulation- no leg at all to test  stable 7/6   Skin:     General: Skin is warm and dry.     Comments: Incisions right and left shoulder and left groin puckered with skin glue.     Neurological:     General: No focal deficit present.     Mental Status: She is alert and oriented to person, place, and time.     Comments: Flat affect. Decreased to light touch in L foot/ankle, otherwise intact to light touch in all 3 limbs   Assessment/Plan: 1. Functional deficits which require 3+ hours per day of interdisciplinary therapy in a comprehensive inpatient rehab setting. Physiatrist is providing close team supervision and 24 hour management of active medical problems listed below. Physiatrist and rehab team continue to assess barriers to discharge/monitor patient progress toward functional and medical goals  Care Tool:  Bathing          Body parts n/a: Right lower leg, Right upper leg   Bathing assist Assist Level: Supervision/Verbal cueing (bed level)     Upper Body Dressing/Undressing Upper body dressing   What is the patient wearing?: Pull  over shirt    Upper body assist Assist Level: Set up assist    Lower Body Dressing/Undressing Lower body dressing      What is the patient wearing?: Pants     Lower body assist Assist for lower body dressing: Supervision/Verbal cueing (bed level)     Toileting Toileting    Toileting assist Assist for toileting: Contact Guard/Touching assist     Transfers Chair/bed transfer  Transfers assist     Chair/bed transfer assist level: Independent with assistive device Chair/bed transfer assistive device: Bedrails   Locomotion Ambulation   Ambulation assist      Assist level: Supervision/Verbal cueing Assistive device: Walker-rolling Max distance: 35'   Walk 10 feet activity   Assist     Assist level: Supervision/Verbal cueing Assistive device: Walker-rolling   Walk 50 feet activity   Assist Walk 50 feet with 2 turns activity did not occur: Safety/medical concerns  (fatigue, poor endurance)         Walk 150 feet activity   Assist Walk 150 feet activity did not occur: Safety/medical concerns (fatigue, poor endurance)         Walk 10 feet on uneven surface  activity   Assist Walk 10 feet on uneven surfaces activity did not occur: Safety/medical concerns (fatigue, poor endurance)     Assistive device: Parallel bars   Wheelchair     Assist Is the patient using a wheelchair?: Yes Type of Wheelchair: Manual    Wheelchair assist level: Dependent - Patient 0% Max wheelchair distance: 250'    Wheelchair 50 feet with 2 turns activity    Assist    Wheelchair 50 feet with 2 turns activity did not occur: Safety/medical concerns   Assist Level: Dependent - Patient 0%   Wheelchair 150 feet activity     Assist      Assist Level: Dependent - Patient 0%   Blood pressure (!) 135/48, pulse 70, temperature 98.9 F (37.2 C), temperature source Oral, resp. rate 18, weight 53.5 kg, SpO2 100%.  Medical Problem List and Plan: 1. 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   2.  Antithrombotics: -DVT/anticoagulation:  Pharmaceutical: Lovenox               -antiplatelet therapy: 7/8- added ASA back 3. Pain Management: Oxycodone  prn. D/c IV dilaudid . Increase gabapentin  to TID             --tylenol  prn.   7/2- pain overall well controlled per pt- will con't regimen  7/3- pain worse this AM, but no pain meds for 8 hours-   ContinueGabapentin to 400 mg TID- Also increased Oxy to 10 mg (from 5mg ) and q3 hours prn  7/7- pt reports pain is great- very  little- con't regimen  7/8- no change with Change to MS Contin  long acting pain meds  7/9- pain doing great 0/10 when takes pain meds 4. Mood/Behavior/Sleep: LCSW to follow for evaluation and support.              -antipsychotic  agents: N/A  5. Neuropsych/cognition: This patient appears to be capable of making decisions on her own behalf.  6. Skin/Wound Care: Routine pressure relief measures. Continue Vitamin C  and protein supplements.  --daily monitoring of right axilla and Right groin wound  for healing.  7/2- VAC placed today to try and decrease risk of wound contamination- also significant drainage, to help with this-  7/7- last changed last week 7/8- VAC wouldn't stay suction yesterday- to put back on today by WOC 7. Fluids/Electrolytes/Nutrition: Strict I/O with daily weights. Continue Ensure bid.  --Will also add juven/beneprotein to promote wound healing.  7/9- will need VAC until f/u with Dr Harden.  8.  MRSA bacteremia: To complete 6 week antibiotic course now on Linzolid 07/13/23 (confirmed w/ID NP that no further suppression needed), continue Linezolid              --continue to monitor CK--last 1,132 on 06/16  7/2- will recheck CK on Monday  7/7- CK 10- doing even better  7/9- PICC d/c;d  9.  HTN: Monitor BP TID. continue  amlodipine , hydralazine  TID and metoprolol --BP trending low today  7/2- BP doing better- controlled, but not really soft- con't to monitor and con't regimen             --will set parameters on meds to avoid orthostatic drop.   7/4- BP doing better 120's-135 systolic- con't regimen  Decrease amlodipine  to 5mg  given diastolic hypotension  7/7- BP running mid 140s now with reduction in Norvasc - if doesn't improve, will increase Norvasc  again. 7/8- BP 151 systolic this AM  but 129 systolic last few checks- will monitor for changing meds 7/9- BP doing better- 120s to 130s in last 24 hours- will con't regimen and not add back Norvasc  increased dose. 10. Acute  blood loss anemia:  Last transfused with one unit 06/29. Hgb 6.9-->9.0 today             --check anemia panel. Iron supplement started on 06/29  7.2- Hb up to 9.4 today- in spite of drainage- con't to monitor  7/4- Hb 8.7- might have dropped slightly- however VAC had 250cc in VAC (200cc in last 24 hours), so this fits- will rechekc Monday/Thursday to monitor- changed to CbC-with diff  7/7- Hb 8.8 - up slightly-  11.  Leucocytosis: WBC trending down from 21.6 post surgery-->13.1              --recheck white count in am.  7/2- Pt's WBC down to 10k from 13k- so doing better  7/4- WBC 7.7k  7/7- WBC 7.7 12. Hypoxia: --Will check CXR to follow up on pulmonary edema/intermittent tachypnea.              --note intermittent drop to low 80's last night/this am.  7/4- no recent drops  13.  Hyponatremia: Recheck in am. Wt 130--133 since hip disarticulation.              --monitor for signs of overload/likely multifactorial.   7/4- Na 132  14.  Severe protein Malnutrition: Monitor daily weighs--last alb @ 1.9.  Prostat and Juven also added. - check pre-albumin  level in am. Recheck Mg level in am.  7/2- Mg 2.0- as well as Albumin  1.9-  -ALT also slightly elevated at 51-will reduce Tylenol  to help- to 325 mg q 4 hours prn  7/7- Albumin  up to 2.0 15.  Pelvic prolapse: Pessary cleaned on 06/30/23.  Last dose diflucan  07/01   16. Urinary retention/Foley  7/3- high risk  of VAC not working/keeping seal- so d/w with entire Team, will keep Foley for now - since put out 50-75cc in last 18 hours. So we decided although risk of CAUTI from foley, don't want high risk of sepsis-from bladder/bowel  Continue foley  7/8- con't foley since still having drainage/ esp now that VAC came off-  7/9- will go home with Foley for now- needs appt with Urology to remove and help pt with voiding- due to wound-   17. Transaminitis  7/2- ALT 51- will reduce Tylenol  amount to reduce ALT levels. Will recheck next Monday    7/7-  LFTs down to normal- 20/29- resolved 18 Constipation  7/7- LBM 2+ days ago- will give extra dose of Miralax - if no result,s will give Sorbitol  30cc.   7/9- LBM 2 days ago- needed Miralax .  19. Epistaxis  7/9- pt had multiple bouts of nose bleeds overnight- will add Afrin prn for nose bleeds. Max 3 days   I spent a total of  51  minutes on total care today- >50% coordination of care- due to  D/w pt and nursing about epistaxis as well as pain control- will need to go home on Current regimen of MS Contin  and Oxy prn.  Addendum- spoke to granddaughter Melody as well as SW about needing to call her.   LOS: 8 days A FACE TO FACE EVALUATION WAS PERFORMED  Katherine Archer 07/12/2023, 8:46 AM

## 2023-07-12 NOTE — Progress Notes (Signed)
 Patient ID: Katherine Archer, female   DOB: [birth year], 68 y.o.   MRN: [id number] Met with pt with interpretor to let know team conference update goals of mod/I-supervision level and discharge 7/10. Family requested MD to call due to question and she has and questions answered. Has DME and [facility] set up for follow up. Trying to get roho cushion but has been on basic which was delivered prior to second surgery. Wound vac will place in room today. Awaiting call back from granddaughter

## 2023-07-12 NOTE — Patient Care Conference (Signed)
 Inpatient RehabilitationTeam Conference and Plan of Care Update Date: 07/11/2023   Time: 11:33 AM    Patient Name: Katherine Archer      Medical Record Number: [id number]  Date of Birth: Nov 02, 1955 Sex: Female         Room/Bed: 4W06C/4W06C-01 Payor Info: Payor: [employer] MEDICARE / Plan: [employer] MEDICARE HMO/PPO / Product Type: *No Product type* /    Admit Date/Time:  07/04/2023  3:30 PM  Primary Diagnosis:  History of disarticulation of right hip  Hospital Problems: Principal Problem:   History of disarticulation of right hip    Expected Discharge Date: Expected Discharge Date: 07/13/23  Team Members Present: Physician leading conference: Dr. Duwaine Barrs Social Worker Present: Rhoda Clement, LCSW Nurse Present: Barnie Ronde, RN PT Present: Other (comment);Donald Pereyra, PT Lessie PT) OT Present: Camie Hoe, OT PPS Coordinator present : Eleanor Colon, SLP     Current Status/Progress Goal Weekly Team Focus  Bowel/Bladder   Pt has a foley, continent of bowel.   Foley management with min assist and continent of bowel   Toilet q 4 hours while awake and PRN.    Swallow/Nutrition/ Hydration               ADL's   SBA overall   Mod I   family ed, D/C planning    Mobility   pt continues to improve functionally, mod indep with transfers, RW for gait   mod ind  endurance, safety measures/fall prevention, discharge planning, HHPT    Communication                Safety/Cognition/ Behavioral Observations               Pain   Pain 8/10 on pain scale. Medicated with PRN Oxycodone .   Maintain pain score of 3 or less on pain scale.   Assess for pain q shift and PRN.    Skin   Dressing to R hip disarticulation, wound vac to be placed back today. Incision abdomen with staples OTA.   Pt to remain free from skin breakdown and s/sx of infection.  Assess skin q shift and PRN.  Assess removal of abd staples    Discharge Planning:  Home with daughter and granddaughter ot be  assisting. Has DME from when here and never left. WOund vac in SW office and HH set up. May need family training prior to discharge.   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 Patient limited by pain, wound vac and IV antibiotics til 07/12/23.   Patient on target to meet rehab goals: yes, currently supervision overall for ADLs and Mod I for transfers. Goals for discharge set for mod I overall.  *See Care Plan and progress notes for long and short-term goals.   Revisions to Treatment Plan:  Prosthetic clinic referral Urology referral Follow up with ID 07/20/23   Teaching Needs: Safety, foley care/peri care, medications, skin care, transfers, toileting, etc.   Current Barriers to Discharge: Decreased caregiver support, Home enviroment access/layout, and negative pressure wound vac  Possible Resolutions to Barriers: Family education DME: wheelchair. [facility] cushion HH PT follow up services     Medical Summary Current Status: constipation somewhat better- premedicatedfor VAC- going home with VAC is planned- cannot remove foley- until wound is is healed better- then will have pt to Wound clinic/Ortho adn  Barriers to Discharge: Complicated Wound;Hypotension;Inadequate Nutritional Intake;Medical stability;Other (comments);Self-care education;Weight bearing restrictions;Symptomatic Anemia  Barriers to Discharge Comments: poor nutrition- pain ok  with meds, but needs them-on Linezolid  for 6+ weeks- and Doxycycline  for 7 days- Possible Resolutions to [employer]: will need prosthetic training after d/c- foley until wound not draining- send home on pain meds; PO ABX; with Urology, Ortho and f/u- with wound care and PM&R- d/c 7/10   Continued Need for Acute Rehabilitation Level of Care: The patient requires daily medical management by a physician with specialized training in physical medicine and rehabilitation for the following  reasons: Direction of a multidisciplinary physical rehabilitation program to maximize functional independence : Yes Medical management of patient stability for increased activity during participation in an intensive rehabilitation regime.: Yes Analysis of laboratory values and/or radiology reports with any subsequent need for medication adjustment and/or medical intervention. : Yes   I attest that I was present, lead the team conference, and concur with the assessment and plan of the team.   Fredericka Sober B 07/12/2023, 9:12 AM

## 2023-07-12 NOTE — Progress Notes (Signed)
 Inpatient Rehabilitation Discharge Medication Review by a Pharmacist  A complete drug regimen review was completed for this patient to identify any potential clinically significant medication issues.  High Risk Drug Classes Is patient taking? Indication by Medication  Antipsychotic No   Anticoagulant No   Antibiotic Yes Linezolid /doxy - wounds  Opioid Yes MS contin , oxycodone  IR PRN- chronic pain/acute pain   Antiplatelet Yes ASA - PAD  Hypoglycemics/insulin  No   Vasoactive Medication Yes Amlodipine , hydralazine , Toprol - HTN   Chemotherapy No   Other Yes Tylenol  - pain Melatonin - sleep VitC, MVI, Trigels-F Forte  Gabapentin - neuropathy  Miralax /senokot S - constipation Zetia  - HLD Ensure - supplement Protonix  - GERD Afrin - congestion     Type of Medication Issue Identified Description of Issue Recommendation(s)  Drug Interaction(s) (clinically significant)     Duplicate Therapy     Allergy     No Medication Administration End Date     Incorrect Dose     Additional Drug Therapy Needed     Significant med changes from prior encounter (inform family/care partners about these prior to discharge).    Other       Clinically significant medication issues were identified that warrant physician communication and completion of prescribed/recommended actions by midnight of the next day:  No  Name of provider notified for urgent issues identified:   Provider Method of Notification:     Pharmacist comments:    Time spent performing this drug regimen review (minutes):  20  Sergio Batch, PharmD, [place], AAHIVP, CPP Infectious Disease Pharmacist 07/12/2023 12:48 PM

## 2023-07-12 NOTE — Plan of Care (Signed)
  Problem: RH Balance Goal: LTG Patient will maintain dynamic standing with ADLs (OT) Description: LTG:  Patient will maintain dynamic standing balance with assist during activities of daily living (OT)  Outcome: Completed/Met Flowsheets (Taken 07/05/2023 1232) LTG: Pt will maintain dynamic standing balance during ADLs with: Independent with assistive device   Problem: RH Grooming Goal: LTG Patient will perform grooming w/assist,cues/equip (OT) Description: LTG: Patient will perform grooming with assist, with/without cues using equipment (OT) Outcome: Completed/Met Flowsheets (Taken 07/05/2023 1232) LTG: Pt will perform grooming with assistance level of: Independent with assistive device    Problem: RH Bathing Goal: LTG Patient will bathe all body parts with assist levels (OT) Description: LTG: Patient will bathe all body parts with assist levels (OT) Outcome: Completed/Met   Problem: RH Dressing Goal: LTG Patient will perform upper body dressing (OT) Description: LTG Patient will perform upper body dressing with assist, with/without cues (OT). Outcome: Completed/Met Flowsheets (Taken 07/05/2023 1232) LTG: Pt will perform upper body dressing with assistance level of: Independent with assistive device Goal: LTG Patient will perform lower body dressing w/assist (OT) Description: LTG: Patient will perform lower body dressing with assist, with/without cues in positioning using equipment (OT) Outcome: Completed/Met Flowsheets (Taken 07/05/2023 1232) LTG: Pt will perform lower body dressing with assistance level of: Independent with assistive device   Problem: RH Toileting Goal: LTG Patient will perform toileting task (3/3 steps) with assistance level (OT) Description: LTG: Patient will perform toileting task (3/3 steps) with assistance level (OT)  Outcome: Completed/Met Flowsheets (Taken 07/05/2023 1232) LTG: Pt will perform toileting task (3/3 steps) with assistance level: Independent with  assistive device   Problem: RH Toilet Transfers Goal: LTG Patient will perform toilet transfers w/assist (OT) Description: LTG: Patient will perform toilet transfers with assist, with/without cues using equipment (OT) Outcome: Completed/Met Flowsheets (Taken 07/05/2023 1232) LTG: Pt will perform toilet transfers with assistance level of: Independent with assistive device   Problem: RH Bathing Goal: LTG Patient will bathe all body parts with assist levels (OT) Description: LTG: Patient will bathe all body parts with assist levels (OT) Outcome: Completed/Met Flowsheets Taken 07/12/2023 1250 LTG: Position pt will perform bathing:  Edge of bed  At sink Taken 07/05/2023 1232 LTG: Pt will perform bathing with assistance level/cueing: Independent with assistive device

## 2023-07-12 NOTE — Progress Notes (Addendum)
 WOC note reviewed and discussed with Dr. Harden. Note indicated central area of incision with some purulence. He recommended continuing MRSA coverage for 3 additional weeks after discharge.

## 2023-07-12 NOTE — Progress Notes (Signed)
 Physical Therapy Discharge Summary  Patient Details  Name: Aritza Brunet MRN: [id number] Date of Birth: 1955/12/10  Date of Discharge from PT service:July 12, 2023  Today's Date: 07/12/2023 PT Individual Time: 1400-1500 PT Individual Time Calculation (min): 60 min    Patient has met 9 of 9 long term goals due to improved activity tolerance, improved balance, improved postural control, increased strength, decreased pain, and improved coordination.  Patient to discharge at an ambulatory level Modified Independent/Supervision with RW/W-C.   Patient's care family is independent to provide the necessary physical assistance at discharge.  Reasons goals not met: N/A  Recommendation:  Patient will benefit from ongoing skilled PT services in home health setting to continue to advance safe functional mobility, address ongoing impairments in endurance, overall strength, pain management and minimize fall risk.  Equipment: No equipment provided.  None needed at this time.   Reasons for discharge: treatment goals met and discharge from hospital  Patient/family agrees with progress made and goals achieved: Yes  Session2: PT discussed discharged plans with patient including home status/environment including steps, home layout, supervision, continuation of skilled therapy with HHPT, safety precautions during healing process right hip. Pt c/o pain to right hip/anterior groin 7/10. RN notified and provided pain meds to patient.  Pt willing to participate with PT, interpreter present during session.  Pt performed supine, EOB, standing ex's to focus on endurance/strength/balance.  Pt able to perform therapy without increasing pain.  Pt returned to bed post session with items within reach.    PT Discharge Precautions/Restrictions Precautions Precautions: Fall Recall of Precautions/Restrictions: Intact Precaution/Restrictions Comments: Contact precs; wound vac right hip Restrictions Weight Bearing  Restrictions Per Provider Order: No Other Position/Activity Restrictions: Rt hip disarticulation Vital Signs   Pain Pain Assessment Pain Scale: 0-10 Pain Score: 7  Pain Location: Hip Pain Intervention(s): Medication (See eMAR) Pain Interference Pain Interference Pain Effect on Sleep: 1. Rarely or not at all Pain Interference with Therapy Activities: 1. Rarely or not at all Pain Interference with Day-to-Day Activities: 1. Rarely or not at all Vision/Perception  Vision - History Ability to See in Adequate Light: 0 Adequate Perception Perception: Within Functional Limits Praxis Praxis: WFL  Cognition Overall Cognitive Status: Within Functional Limits for tasks assessed Arousal/Alertness: Awake/alert Orientation Level: Oriented X4 Safety/Judgment: Appears intact Sensation Sensation Light Touch: Impaired by gross assessment Central sensation comments: impaired d/t limb loss Peripheral sensation comments: LLE Light Touch Impaired Details: Absent RLE Hot/Cold: Appears Intact Proprioception: Appears Intact Stereognosis: Not tested Coordination Gross Motor Movements are Fluid and Coordinated: Yes Fine Motor Movements are Fluid and Coordinated: Yes Coordination and Movement Description: Deficits due to R his disarticulation, RLE AKA. Motor  Motor Motor: Within Functional Limits  Mobility Bed Mobility Bed Mobility: Rolling Left;Left Sidelying to Sit;Sitting - Scoot to Edge of Bed;Sit to Sidelying Left;Supine to Sit;Sit to Supine;Scooting to [facility];Rolling Right Rolling Right: Independent with assistive device Rolling Left: Independent with assistive device;Moderate Assistance - Patient 50-74% Left Sidelying to Sit: Independent with assistive device Supine to Sit: Independent with assistive device Sitting - Scoot to Edge of Bed: Independent with assistive device Sit to Supine: Independent with assistive device Sit to Sidelying Left: Independent with assistive device Scooting to  [facility]: Independent with assistive device Transfers Transfers: Sit to Stand;Stand to Sit;Stand Pivot Transfers Sit to Stand: Independent with assistive device Stand to Sit: Independent with assistive device Stand Pivot Transfers: Independent with assistive device Transfer (Assistive device): Rolling walker Locomotion  Gait Ambulation: Yes Gait Assistance: Supervision/Verbal cueing Gait  Distance (Feet): 170 Feet Assistive device: Rolling walker Gait Gait: Yes Gait Pattern: Impaired Gait velocity: decreased Stairs / Additional Locomotion Stairs: Yes Stairs Assistance: Supervision/Verbal cueing Stair Management Technique: Two rails Number of Stairs: 12 Height of Stairs: 6 Ramp: Supervision/Verbal cueing Curb: Supervision/Verbal cueing Pick up small object from the floor assist level: Independent with assistive device Pick up small object from the floor assistive device: reacher and Orthoptist Mobility: Yes Wheelchair Assistance: Independent with Scientist, research (life sciences): Both upper extremities Wheelchair Parts Management: Independent Distance: 50  Trunk/Postural Assessment  Cervical Assessment Cervical Assessment: Within Functional Limits Thoracic Assessment Thoracic Assessment: Within Functional Limits Lumbar Assessment Lumbar Assessment: Within Functional Limits Postural Control Postural Control: Within Functional Limits  Balance Balance Balance Assessed: Yes Static Sitting Balance Static Sitting - Balance Support: Feet supported Static Sitting - Level of Assistance: 6: Modified independent (Device/Increase time) Dynamic Sitting Balance Dynamic Sitting - Balance Support: During functional activity Dynamic Sitting - Level of Assistance: 6: Modified independent (Device/Increase time) Dynamic Sitting - Balance Activities: Forward lean/weight shifting;Reaching for objects Sitting balance - Comments: supervision static sitting Static  Standing Balance Static Standing - Balance Support: Bilateral upper extremity supported;During functional activity Static Standing - Level of Assistance: 6: Modified independent (Device/Increase time) Dynamic Standing Balance Dynamic Standing - Balance Support: During functional activity;Left upper extremity supported Dynamic Standing - Level of Assistance: 6: Modified independent (Device/Increase time) Dynamic Standing - Balance Activities: Forward lean/weight shifting;Lateral lean/weight shifting;Reaching for objects Extremity Assessment      RLE Assessment RLE Assessment: Exceptions to [facility] Strength Comments: AKA LLE Assessment LLE Assessment: Within Functional Limits General Strength Comments: [place]   [name] Fast 07/12/2023, 3:39 PM

## 2023-07-12 NOTE — Progress Notes (Signed)
 Occupational Therapy Discharge Summary  Patient Details  Name: Katherine Archer MRN: [id number] Date of Birth: Nov 10, 1955  Date of Discharge from OT service:June 12, 2023  Today's Date: 07/12/2023 OT Individual Time: 1045-1200 OT Individual Time Calculation (min): 75 min    Patient has met 8 of 8 long term goals due to improved activity tolerance, improved balance, postural control, ability to compensate for deficits, and functional use of  RIGHT upper, LEFT upper, and LEFT lower extremity.  Patient to discharge at overall Modified Independent level.  Patient's care partner is independent to provide the necessary physical assistance at discharge.    Reasons goals not met: All goals met  Recommendation:  Patient will benefit from ongoing skilled OT services in outpatient setting to continue to advance functional skills in the area of BADL, iADL, and Reduce care partner burden.  Equipment: No equipment provided, pt has all equipment from prior hospital stay  Reasons for discharge: treatment goals met and discharge from hospital  Patient/family agrees with progress made and goals achieved: Yes  OT Discharge Precautions/Restrictions  Precautions Precautions: Fall Precaution/Restrictions Comments: Contact precs; wound vac right hip Restrictions Weight Bearing Restrictions Per Provider Order: No RLE Weight Bearing Per Provider Order: Weight bearing as tolerated Other Position/Activity Restrictions: Rt hip disarticulation Pain Pain Assessment Pain Scale: 0-10 Pain Score: 2  Pain Location: Hip Pain Orientation: Right Pain Descriptors / Indicators: Aching Pain Onset: On-going ADL ADL Eating: Set up Where Assessed-Eating: Bed level, Chair Grooming: Setup Where Assessed-Grooming: Bed level Upper Body Bathing: Supervision/safety Where Assessed-Upper Body Bathing: Bed level Lower Body Bathing: Supervision/safety Where Assessed-Lower Body Bathing: Bed level Upper Body Dressing:  Supervision/safety Where Assessed-Upper Body Dressing: Bed level Lower Body Dressing: Supervision/safety Where Assessed-Lower Body Dressing: Bed level Toileting: Contact guard Where Assessed-Toileting: Toilet, Bedside Commode Toilet Transfer: Furniture conservator/restorer Method: Insurance claims handler: Not assessed (N/A, pt sponge bathing at baseline) Film/video editor: Not assessed Vision Baseline Vision/History: 0 No visual deficits Patient Visual Report: No change from baseline Vision Assessment?: No apparent visual deficits Perception  Perception: Within Functional Limits Praxis Praxis: WFL Cognition Cognition Overall Cognitive Status: Within Functional Limits for tasks assessed Arousal/Alertness: Awake/alert Orientation Level: Place;Person;Situation Person: Oriented Place: Oriented Situation: Oriented Memory: Appears intact Awareness: Appears intact Problem Solving: Appears intact Safety/Judgment: Appears intact Brief Interview for Mental Status (BIMS) Repetition of Three Words (First Attempt): 3 Temporal Orientation: Year: Correct Temporal Orientation: Month: Accurate within 5 days Temporal Orientation: Day: Correct Recall: Sock: Yes, no cue required Recall: Blue: Yes, no cue required Recall: Bed: Yes, no cue required BIMS Summary Score: 15 Sensation Sensation Light Touch: Impaired by gross assessment Central sensation comments: impaired d/t limb loss Light Touch Impaired Details: Absent RLE Hot/Cold: Appears Intact Proprioception: Appears Intact Stereognosis: Not tested Coordination Gross Motor Movements are Fluid and Coordinated: Yes Fine Motor Movements are Fluid and Coordinated: Yes Motor  Motor Motor: Within Functional Limits Mobility  Bed Mobility Bed Mobility: Rolling Left;Left Sidelying to Sit;Sitting - Scoot to Edge of Bed;Sit to Sidelying Left;Supine to Sit;Sit to Supine;Scooting to [facility];Rolling Right Rolling Right:  Independent with assistive device Rolling Left: Independent with assistive device Left Sidelying to Sit: Independent with assistive device Supine to Sit: Independent with assistive device Sitting - Scoot to Edge of Bed: Independent with assistive device Sit to Supine: Independent with assistive device Sit to Sidelying Left: Independent with assistive device Scooting to [facility]: Independent with assistive device Transfers Sit to Stand: Independent with assistive device Stand to Sit: Independent with assistive  device  Trunk/Postural Assessment  Cervical Assessment Cervical Assessment: Within Functional Limits Thoracic Assessment Thoracic Assessment: Within Functional Limits Lumbar Assessment Lumbar Assessment: Within Functional Limits Postural Control Postural Control: Within Functional Limits  Balance Balance Balance Assessed: Yes Static Sitting Balance Static Sitting - Balance Support: Feet supported Static Sitting - Level of Assistance: 6: Modified independent (Device/Increase time) Dynamic Sitting Balance Dynamic Sitting - Balance Support: During functional activity Dynamic Sitting - Level of Assistance: 6: Modified independent (Device/Increase time) Dynamic Sitting - Balance Activities: Forward lean/weight shifting;Reaching for objects Static Standing Balance Static Standing - Balance Support: Bilateral upper extremity supported;During functional activity Static Standing - Level of Assistance: 6: Modified independent (Device/Increase time) Dynamic Standing Balance Dynamic Standing - Balance Support: During functional activity;Left upper extremity supported Dynamic Standing - Level of Assistance: 6: Modified independent (Device/Increase time) Dynamic Standing - Balance Activities: Forward lean/weight shifting;Lateral lean/weight shifting;Reaching for objects Extremity/Trunk Assessment RUE Assessment RUE Assessment: Within Functional Limits LUE Assessment LUE Assessment: Within  Functional Limits  Tx Session General: Pt supine in bed upon OT arrival, agreeable to OT session. Interpreter present  Pain:  unrated pain reported in Rt hip, activity, intermittent rest breaks, distractions provided for pain management, pt reports tolerable to proceed.   Exercises: OT issued UE theraband HEP in order to increase functional strength, andurance and activity tolerance in order to increase independence in ADLs such as bathing. Pt issued yellow theraband and discussed direction/technique of exercises, demonstrating verbal understanding. Exercises listed below: - Sit to Stand  - 3 sets - 5 reps - Seated Shoulder Horizontal Abduction with Resistance  - 3 sets - 10 reps - Seated Elbow Extension with Self-Anchored Resistance  - 3 sets - 10 reps - Seated Elbow Flexion with Self-Anchored Resistance  - 3 sets - 10 reps - Seated Shoulder Flexion with Self-Anchored Resistance  - 3 sets - 10 reps - Seated Diagonal Reach with Resistance  - 3 sets - 10 reps   Other Treatments: OT asking any questions/comments/concerns about D/C. Pt left with no questions/concerns about D/C.   Pt seated in W/C at end of session with W/C alarm donned, call light within reach and 4Ps assessed.    Camie Hoe, OTD, OTR/L 07/12/2023, 12:55 PM

## 2023-07-12 NOTE — Progress Notes (Signed)
 Physical Therapy Session Note  Patient Details  Name: Katherine Archer MRN: [id number] Date of Birth: 1955-10-05  Today's Date: 07/12/2023 PT Individual Time: 9169-9054 PT Individual Time Calculation (min): 75 min   Short Term Goals: Week 1:  PT Short Term Goal 1 (Week 1): STG=LTG Week 2:     Skilled Therapeutic Interventions/Progress Updates:    Pt in bed when PT arrived.  PT accessed iPAD interpreter initially, prior to Katherine Archer (interpreter) arriving.  Pt denies pain, feels weak, but willing to participate with PT.  Pt performed supine LE exercises: SLR, hip abd/add 2x20 reps each, supine->sit->w/c with mod indep. Pt wheeled to main gym to conserve energy.  Pt negotiated stairs with x2 railing for support x2 with supervision. Pt demonstrates good balance/control negotiating steps without c/o pain.  Gait tx with RW x30',rest, x50', rest, x150' SBA. Pt fatigued post session requesting to return to bed, PT suggested sitting in w/c, pt chose recliner. Pt able to transfer self into recliner from w/c with supervision/mod indep all items within reach.   Therapy Documentation Precautions:  Precautions Precautions: Fall Recall of Precautions/Restrictions: Intact Precaution/Restrictions Comments: Contact precs; wound vac right hip Restrictions Weight Bearing Restrictions Per Provider Order: No RLE Weight Bearing Per Provider Order: Weight bearing as tolerated Other Position/Activity Restrictions: Rt hip disarticulation  Pain: Pain Assessment Pain Scale: 0-10 Pain Score: 0  Pain Location: Hip Pain Orientation: Right Pain Descriptors / Indicators: Aching Pain Onset: On-going Mobility: Bed Mobility Bed Mobility: Rolling Left;Left Sidelying to Sit;Sitting - Scoot to Edge of Bed;Sit to Sidelying Left;Supine to Sit;Sit to Supine;Scooting to [facility];Rolling Right Rolling Right: Independent with assistive device Rolling Left: Independent with assistive device Left Sidelying to Sit: Independent with  assistive device Supine to Sit: Independent with assistive device Sitting - Scoot to Edge of Bed: Independent with assistive device Sit to Supine: Independent with assistive device Sit to Sidelying Left: Independent with assistive device Scooting to [facility]: Independent with assistive device Transfers Transfers: Sit to Stand;Stand to Sit;Stand Pivot Transfers Sit to Stand: Independent with assistive device Stand to Sit: Independent with assistive device Stand Pivot Transfers: Independent with assistive device Transfer (Assistive device): Rolling walker Locomotion :    Trunk/Postural Assessment : Cervical Assessment Cervical Assessment: Within Functional Limits Thoracic Assessment Thoracic Assessment: Within Functional Limits Lumbar Assessment Lumbar Assessment: Within Functional Limits Postural Control Postural Control: Within Functional Limits  Balance: Balance Balance Assessed: Yes Static Sitting Balance Static Sitting - Balance Support: Feet supported Static Sitting - Level of Assistance: 6: Modified independent (Device/Increase time) Dynamic Sitting Balance Dynamic Sitting - Balance Support: During functional activity Dynamic Sitting - Level of Assistance: 6: Modified independent (Device/Increase time) Dynamic Sitting - Balance Activities: Forward lean/weight shifting;Reaching for objects Static Standing Balance Static Standing - Balance Support: Bilateral upper extremity supported;During functional activity Static Standing - Level of Assistance: 6: Modified independent (Device/Increase time) Dynamic Standing Balance Dynamic Standing - Balance Support: During functional activity;Left upper extremity supported Dynamic Standing - Level of Assistance: 6: Modified independent (Device/Increase time) Dynamic Standing - Balance Activities: Forward lean/weight shifting;Lateral lean/weight shifting;Reaching for objects Exercises:   Other Treatments:      Therapy/Group: Individual  Therapy  Katherine Archer Fast 07/12/2023, 12:23 PM

## 2023-07-13 ENCOUNTER — Other Ambulatory Visit (HOSPITAL_COMMUNITY): Payer: Self-pay

## 2023-07-13 DIAGNOSIS — D62 Acute posthemorrhagic anemia: Secondary | ICD-10-CM

## 2023-07-13 DIAGNOSIS — E871 Hypo-osmolality and hyponatremia: Secondary | ICD-10-CM | POA: Insufficient documentation

## 2023-07-13 HISTORY — DX: Acute posthemorrhagic anemia: D62

## 2023-07-13 HISTORY — DX: Hypo-osmolality and hyponatremia: E87.1

## 2023-07-13 NOTE — Discharge Summary (Signed)
 Physician Discharge Summary  Patient ID: Katherine Archer MRN: [id number] DOB/AGE: September 22, 1955 68 y.o.  Admit date: 07/04/2023 Discharge date: 07/13/2023  Discharge Diagnoses:  Principal Problem:   History of disarticulation of right hip Active Problems:   Protein-calorie malnutrition, severe   Prolapse of female pelvic organs   Acute blood loss anemia   Hyponatremia  Abnormal LFTs Constipation Epistaxis Low calorie malnutrition.   Discharged Condition: stable  Significant Diagnostic Studies: DG Chest 2 View Result Date: 07/04/2023 CLINICAL DATA:  Hypoxia EXAM: CHEST - 2 VIEW COMPARISON:  Chest x-ray 05/29/2023, one-view.  Older exams as well FINDINGS: Enlarged cardiopericardial silhouette with calcified aorta. No consolidation, pneumothorax or effusion. No edema seen on the current examination. Degenerative changes of the spine. There is a right-sided PICC with tip extending to the SVC right atrial junction. IMPRESSION: Right-sided PICC. Enlarged cardiopericardial silhouette. Calcified aorta Electronically Signed   By: Ranell Bring M.D.   On: 07/04/2023 16:25   DG Abd Portable 1V Result Date: 06/28/2023 CLINICAL DATA:  Abdominal pain and distention. EXAM: PORTABLE ABDOMEN - 1 VIEW COMPARISON:  Aorto bi-iliac CTA dated 05/29/2023. FINDINGS: Interval amputation of the right leg with no bone destruction or periosteal reaction involving the pelvic bones. No soft tissue gas. Right pelvic skin clips. Normal bowel-gas pattern. Right lower skin clips. Pessary. IMPRESSION: 1. Interval amputation of the right leg. 2. No acute abnormality. Electronically Signed   By: Elspeth Bathe M.D.   On: 06/28/2023 15:28    Labs:  Basic Metabolic Panel:    Latest Ref Rng & Units 07/10/2023    5:00 AM 07/06/2023    4:09 AM 07/05/2023    8:53 AM  BMP  Glucose 70 - 99 mg/dL 97  897  885   BUN 8 - 23 mg/dL 23  13  9    Creatinine 0.44 - 1.00 mg/dL 9.42  9.47  9.42   Sodium 135 - 145 mmol/L 133  132  133   Potassium 3.5  - 5.1 mmol/L 4.0  3.9  4.4   Chloride 98 - 111 mmol/L 101  97  98   CO2 22 - 32 mmol/L 23  25  25    Calcium  8.9 - 10.3 mg/dL 8.8  9.0  9.2      CBC:    Latest Ref Rng & Units 07/10/2023    5:00 AM 07/06/2023    4:09 AM 07/05/2023    8:53 AM  CBC  WBC 4.0 - 10.5 K/uL 7.7  7.7  10.0   Hemoglobin 12.0 - 15.0 g/dL 8.8  8.7  9.4   Hematocrit 36.0 - 46.0 % 28.3  27.5  28.9   Platelets 150 - 400 K/uL 293  286  328      CBG: No results for input(s): GLUCAP in the last 168 hours.  Brief HPI:   Katherine Archer is a 68 y.o. female ***   Hospital Course: Katherine Archer was admitted to rehab 07/04/2023 for inpatient therapies to consist of PT and OT at least three hours five days a week. Past admission physiatrist, therapy team and rehab RN have worked together to provide customized collaborative inpatient rehab.   Blood pressures were monitored on TID basis and    Rehab course: During patient's stay in rehab weekly team conferences were held to monitor patient's progress, set goals and discuss barriers to discharge. At admission, patient required  She  has had improvement in activity tolerance, balance, postural control as well as ability to compensate for deficits.  Discharge disposition: 06-Home-Health Care Svc  Diet:  Special Instructions:  Discharge Instructions     Ambulatory referral to Physical Medicine Rehab   Complete by: As directed    Appointment in a week for pain med refill      Allergies as of 07/13/2023       Reactions   Daptomycin     Elevated CK   Penicillins Other (See Comments)   Shaky and Dizziness 20 years ago        Medication List     STOP taking these medications    alendronate 70 MG tablet Commonly known as: FOSAMAX   hydrochlorothiazide  12.5 MG tablet Commonly known as: HYDRODIURIL    irbesartan  75 MG tablet Commonly known as: AVAPRO    Zinc  Sulfate 220 (50 Zn) MG Tabs       TAKE these medications    acetaminophen  325 MG  tablet Commonly known as: TYLENOL  Take 1 tablet (325 mg total) by mouth every 4 (four) hours as needed for mild pain (pain score 1-3). What changed:  how much to take when to take this reasons to take this   amLODipine  5 MG tablet Commonly known as: NORVASC  Tome 1 tableta (5 mg en total) por va oral diariamente. (Take 1 tablet (5 mg total) by mouth daily.) What changed:  medication strength how much to take   ascorbic acid  500 MG tablet Commonly known as: VITAMIN C  Take 0.5 tablets (250 mg total) by mouth 2 (two) times daily.   aspirin  EC 81 MG tablet Take 1 tablet (81 mg total) by mouth daily. Swallow whole.   doxycycline  100 MG tablet Commonly known as: VIBRA -TABS Take 1 tablet (100 mg total) by mouth every 12 (twelve) hours.   ezetimibe  10 MG tablet Commonly known as: ZETIA  Tome 1 tableta (10 mg en total) por va oral diariamente. (Take 1 tablet (10 mg total) by mouth daily.) Notes to patient: Take twice a day till gone   feeding supplement Liqd Take 237 mLs by mouth 2 (two) times daily between meals.   gabapentin  400 MG capsule Commonly known as: NEURONTIN  Take 1 capsule (400 mg total) by mouth 3 (three) times daily. What changed:  medication strength how much to take when to take this   hydrALAZINE  25 MG tablet Commonly known as: APRESOLINE  Take 1 tablet (25 mg total) by mouth every 8 (eight) hours.   linezolid  600 MG tablet Commonly known as: ZYVOX  Take 1 tablet (600 mg total) by mouth every 12 (twelve) hours.   melatonin 5 MG Tabs Take 1 tablet (5 mg total) by mouth at bedtime as needed.   metoprolol  succinate 25 MG 24 hr tablet Commonly known as: TOPROL -XL Take 0.5 tablets (12.5 mg total) by mouth daily.   morphine  15 MG 12 hr tablet Commonly known as: MS CONTIN  Take 1 tablet (15 mg total) by mouth every 12 (twelve) hours.   multivitamin with minerals Tabs tablet Tome 1 tableta por va oral diariamente. (Take 1 tablet by mouth daily.)    Oxycodone  HCl 10 MG Tabs Take 1 tablet (10 mg total) by mouth 4 (four) times daily as needed (severe pain level > 5). What changed:  medication strength how much to take when to take this reasons to take this Another medication with the same name was removed. Continue taking this medication, and follow the directions you see here.   oxymetazoline  0.05 % nasal spray Commonly known as: AFRIN Place 1 spray into both nostrils 2 (two) times daily as needed (nosebleeds).  Notes to patient: Take twice a day for today and tomorrow for nose bleed then stop.  (Use as needed for 3 days in a row if nose bleed recurs.)    pantoprazole  40 MG tablet Commonly known as: PROTONIX  Tome 1 tableta (40 mg en total) por va oral diariamente. (Take 1 tablet (40 mg total) by mouth daily.)   polyethylene glycol powder 17 GM/SCOOP powder Commonly known as: GLYCOLAX /MIRALAX  Take 17 g by mouth daily. What changed:  when to take this reasons to take this   Senna-S 8.6-50 MG tablet Generic drug: senna-docusate Take 2 tablets by mouth daily with supper. What changed:  how much to take when to take this   Trigels-F Forte Caps capsule Generic drug: Fe Fum-Vit C-Vit B12-FA Take 1 capsule by mouth daily with lunch. What changed: when to take this        Follow-up Information     [name], MD Follow up.   Specialty: Family Medicine Why: Call in 1-2 days for post hospital follow up Contact information: [address] [place] [address] [phone]         Harden Jerona GAILS, MD Follow up.   Specialty: Orthopedic Surgery Why: Call in 1-2 days for post hospital follow up Contact information: [address] [place]  [place] [address] [phone]         Dea Shiner, MD Follow up on 07/20/2023.   Specialty: Infectious Diseases Why: Appointment at 1;30 pm Contact information: [address] [place] [address] [phone]         Cornelio Bouchard, MD Follow up on 07/18/2023.   Specialty: Physical Medicine and Rehabilitation Why: Appointment at 9:50 am (w/Eunice Debby NP) Contact information: [address] [place] [address] [phone]                 Signed: Sharlet GORMAN Schmitz 07/14/2023, 7:29 PM

## 2023-07-13 NOTE — Progress Notes (Signed)
 PROGRESS NOTE   Subjective/Complaints:   Pt reports ready for d/c still has foley- will need to f/u with urology.    Will need to go home on ABX for 3 extra weeks.  Explained to pt and granddaughter- will get Pain meds for 1 week/7 days and then will call in to our clinic to get refills.    ROS:  Per HPI   Pt denies SOB, abd pain, CP, N/V/C/D, and vision changes     Objective:   No results found.  No results for input(s): WBC, HGB, HCT, PLT in the last 72 hours.   No results for input(s): NA, K, CL, CO2, GLUCOSE, BUN, CREATININE, CALCIUM  in the last 72 hours.    Intake/Output Summary (Last 24 hours) at 07/13/2023 0927 Last data filed at 07/13/2023 0756 Gross per 24 hour  Intake 680 ml  Output 1300 ml  Net -620 ml        Physical Exam: Vital Signs Blood pressure (!) 131/53, pulse 70, temperature 99.2 F (37.3 C), temperature source Oral, resp. rate 17, weight 54.4 kg, SpO2 100%.          General: awake, alert, appropriate,  sitting up in bed; NAD HENT: conjugate gaze; oropharynx moist CV: regular rate and rhythm; no JVD Pulmonary: CTA B/L; no W/R/R- good air movement GI: soft, NT, ND, (+)BS Psychiatric: appropriate Neurological: Ox3   Genitourinary:    Comments: Foley in place with less cloudy urine  MSK- R hip disarticulation with dressing C/D/I before therapy- looks C/D/I this AM Musculoskeletal:  VAC off- C/D/I dressing placed 2-3 hours ago on R hip disarticulation    Cervical back: Neck supple. No tenderness.     Comments: Right groin with dry padded dressing and appears to have old serosanguinous drainage. Dressing C/D/I and dated 6/30  Ue's 5-/5 B/L throughout LLE- 5-/5 in L leg- throughout RLE- Hip disarticulation- no leg at all to test  stable 7/6   Skin:    General: Skin is warm and dry.     Comments: Incisions right and left shoulder and left groin  puckered with skin glue.     Neurological:     General: No focal deficit present.     Mental Status: She is alert and oriented to person, place, and time.     Comments: Flat affect. Decreased to light touch in L foot/ankle, otherwise intact to light touch in all 3 limbs   Assessment/Plan: 1. Functional deficits which require 3+ hours per day of interdisciplinary therapy in a comprehensive inpatient rehab setting. Physiatrist is providing close team supervision and 24 hour management of active medical problems listed below. Physiatrist and rehab team continue to assess barriers to discharge/monitor patient progress toward functional and medical goals  Care Tool:  Bathing          Body parts n/a: Right lower leg, Right upper leg   Bathing assist Assist Level: Independent with assistive device     Upper Body Dressing/Undressing Upper body dressing   What is the patient wearing?: Pull over shirt    Upper body assist Assist Level: Independent with assistive device    Lower Body Dressing/Undressing Lower body dressing  What is the patient wearing?: Pants     Lower body assist Assist for lower body dressing: Independent with assitive device     Toileting Toileting    Toileting assist Assist for toileting: Independent with assistive device     Transfers Chair/bed transfer  Transfers assist     Chair/bed transfer assist level: Independent with assistive device Chair/bed transfer assistive device: Walker, Bedrails   Locomotion Ambulation   Ambulation assist      Assist level: Supervision/Verbal cueing Assistive device: Walker-rolling Max distance: 170'   Walk 10 feet activity   Assist     Assist level: Supervision/Verbal cueing Assistive device: Walker-rolling   Walk 50 feet activity   Assist Walk 50 feet with 2 turns activity did not occur: Safety/medical concerns (fatigue, poor endurance)  Assist level: Supervision/Verbal  cueing Assistive device: Walker-rolling    Walk 150 feet activity   Assist Walk 150 feet activity did not occur: Safety/medical concerns (fatigue, poor endurance)  Assist level: Supervision/Verbal cueing Assistive device: Walker-rolling    Walk 10 feet on uneven surface  activity   Assist Walk 10 feet on uneven surfaces activity did not occur: Safety/medical concerns (fatigue, poor endurance)   Assist level: Contact Guard/Touching assist Assistive device: Walker-rolling   Wheelchair     Assist Is the patient using a wheelchair?: Yes Type of Wheelchair: Manual    Wheelchair assist level: Set up assist Max wheelchair distance: 50    Wheelchair 50 feet with 2 turns activity    Assist    Wheelchair 50 feet with 2 turns activity did not occur: Safety/medical concerns   Assist Level: Supervision/Verbal cueing   Wheelchair 150 feet activity     Assist      Assist Level: Dependent - Patient 0% (due to fatigue)   Blood pressure (!) 131/53, pulse 70, temperature 99.2 F (37.3 C), temperature source Oral, resp. rate 17, weight 54.4 kg, SpO2 100%.  Medical Problem List and Plan: 1. 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   Cannot remove foley because will contaminate the incision/wound which is not doing well. Had purulent drainage yesterday. .   2.  Antithrombotics: -DVT/anticoagulation:  Pharmaceutical: Lovenox               -antiplatelet therapy: 7/8- added ASA back 3. Pain  Management: Oxycodone  prn. D/c IV dilaudid . Increase gabapentin  to TID             --tylenol  prn.   7/2- pain overall well controlled per pt- will con't regimen  7/3- pain worse this AM, but no pain meds for 8 hours-   ContinueGabapentin to 400 mg TID- Also increased Oxy to 10 mg (from 5mg ) and q3 hours prn  7/7- pt reports pain is great- very little- con't regimen  7/8- no change with Change to MS Contin  long acting pain meds  7/9- pain doing great 0/10  when takes pain meds  7/10- gets pain meds for 7 days, then call my clinic to get refills 4. Mood/Behavior/Sleep: LCSW to follow for evaluation and support.              -antipsychotic agents: N/A  5. Neuropsych/cognition: This patient appears to be capable of making decisions on her own behalf.  6. Skin/Wound Care: Routine pressure relief measures. Continue Vitamin C  and protein supplements.  --daily monitoring of right axilla and Right groin wound  for healing.  7/2- VAC placed today to try and decrease risk of wound contamination- also significant drainage, to help with this-  7/7- last changed last week 7/8- VAC wouldn't stay suction yesterday- to put back on today by WOC 7/10- VAC being changed today-  7. Fluids/Electrolytes/Nutrition: Strict I/O with daily weights. Continue Ensure bid.  --Will also add juven/beneprotein to promote wound healing.  7/9- will need VAC until f/u with Dr Harden.  8.  MRSA bacteremia: To complete 6 week antibiotic course now on Linzolid 07/13/23 (confirmed w/ID NP that no further suppression needed), continue Linezolid              --continue to monitor CK--last 1,132 on 06/16  7/2- will recheck CK on Monday  7/7- CK 10- doing even better  7/9- PICC d/c;d   7/10- will send home with 3 more weeks of Linezolid  since draining some purulence- will have pt f/u with ID as well 9.  HTN: Monitor BP TID. continue  amlodipine , hydralazine  TID and metoprolol --BP trending low today  7/2- BP doing better- controlled,  but not really soft- con't to monitor and con't regimen             --will set parameters on meds to avoid orthostatic drop.   7/4- BP doing better 120's-135 systolic- con't regimen  Decrease amlodipine  to 5mg  given diastolic hypotension  7/7- BP running mid 140s now with reduction in Norvasc - if doesn't improve, will increase Norvasc  again. 7/8- BP 151 systolic this AM  but 129 systolic last few checks- will monitor for changing meds 7/9- BP doing better- 120s to 130s in last 24 hours- will con't regimen and not add back Norvasc  increased dose. 10. Acute blood loss anemia:  Last transfused with one unit 06/29. Hgb 6.9-->9.0 today             --check anemia panel. Iron supplement started on 06/29  7.2- Hb up to 9.4 today- in spite of drainage- con't to monitor  7/4- Hb 8.7- might have dropped slightly- however VAC had 250cc in VAC (200cc in last 24 hours), so this fits- will rechekc Monday/Thursday to monitor- changed to CbC-with diff  7/7- Hb 8.8 - up slightly-  11.  Leucocytosis: WBC trending down from 21.6 post surgery-->13.1              --recheck white count in am.  7/2- Pt's WBC down to 10k from 13k- so doing better  7/4- WBC 7.7k  7/7- WBC 7.7 12. Hypoxia: --Will check CXR to follow up on pulmonary edema/intermittent tachypnea.              --note intermittent drop to low 80's last night/this am.  7/4- no recent drops  13.  Hyponatremia: Recheck in am. Wt 130--133 since hip disarticulation.              --monitor for signs of overload/likely multifactorial.   7/4- Na 132  14.  Severe protein Malnutrition: Monitor daily weighs--last alb @ 1.9.  Prostat and Juven also added. - check pre-albumin  level in am. Recheck Mg level in am.  7/2- Mg 2.0- as well as Albumin  1.9-  -ALT also slightly elevated at 51-will reduce Tylenol  to help- to 325 mg q 4 hours prn  7/7- Albumin  up to 2.0 15.  Pelvic prolapse: Pessary cleaned on 06/30/23.  Last dose diflucan  07/01   16. Urinary  retention/Foley  7/3- high risk of VAC not working/keeping seal- so d/w with entire Team, will keep Foley for now - since put out 50-75cc in last 18 hours. So we decided although risk of CAUTI from foley, don't want high risk of sepsis-from bladder/bowel  Continue foley  7/8- con't foley since still having drainage/ esp now that VAC came off-  7/9- will go home with Foley for now- needs appt with Urology to remove and help pt with voiding- due to wound-   17. Transaminitis  7/2- ALT 51- will reduce Tylenol  amount to reduce ALT levels. Will recheck next Monday    7/7- LFTs down to normal- 20/29- resolved 18 Constipation  7/7- LBM 2+ days ago- will give extra dose of Miralax - if no result,s will give Sorbitol  30cc.   7/9- LBM 2 days ago- needed Miralax .  19. Epistaxis  7/9- pt had multiple bouts of nose bleeds overnight- will add Afrin prn for nose bleeds. Max 3 days    The patient is medically ready for discharge to home and will need follow-up with [facility] PM&R. In addition, they will need to follow up with their PCP, Wound care, and Urology, Orthopedics, and ID due to continued purulence- .   I spent a total of 37   minutes on total care today- >50% coordination of care- due to  D/w PA at length about d/c plans- pain meds-  PLEASE HAVE clinic make appt with Me AND Fidela at the same time.    LOS: 9 days A FACE TO FACE EVALUATION WAS PERFORMED  Coreyon Nicotra 07/13/2023, 9:27 AM

## 2023-07-13 NOTE — Progress Notes (Addendum)
 Patient ID: Katherine Archer, female   DOB: 1955/09/29, 68 y.o.   MRN: [id number]  Family requesting a hospital bed for home. Have placed order to [employer] for delivery of bed. They will reach out to granddaughter to set up delivery. Address going to is [address] [place] [address]

## 2023-07-13 NOTE — Progress Notes (Signed)
 Inpatient Rehabilitation Care Coordinator Discharge Note   Patient Details  Name: Katherine Archer MRN: [id number] Date of Birth: 1955/12/30   Discharge location: HOME WITH DAUGHTER OR GRANDDAUGHTER UNTIL HOME RENOVATIONS COMPLETED WILL HAVE 24/7 CARE  Length of Stay: 9 DAYS  Discharge activity level: MOD/I LEVEL  Home/community participation: ACTIVE  Patient response un:Yzjouy Literacy - How often do you need to have someone help you when you read instructions, pamphlets, or other written material from your doctor or pharmacy?: Never  Patient response un:Dnrpjo Isolation - How often do you feel lonely or isolated from those around you?: Never  Services provided included: MD, RD, PT, OT, RN, CM, TR, Pharmacy, Neuropsych, SW  Financial Services:  Field seismologist Utilized: Physiological scientist MEDICARE  Choices offered to/list presented to: PT AND DAUGHTER  Follow-up services arranged:  Home Health, DME, Patient/Family has no preference for HH/DME agencies Home Health Agency: [facility]  PT  OT  RN    DME : [name]  HOSP[facility] BED  Patient response to transportation need: Is the patient able to respond to transportation needs?: Yes In the past 12 months, has lack of transportation kept you from medical appointments or from getting medications?: No In the past 12 months, has lack of transportation kept you from meetings, work, or from getting things needed for daily living?: No   Patient/Family verbalized understanding of follow-up arrangements:  Yes  Individual responsible for coordination of the follow-up plan: Katherine Archer [phone]  Confirmed correct DME delivered: Katherine Archer MATSU 07/13/2023    Comments (or additional information):PT DID WELL AND REACHED HER GOALS READY FOR DISCHARGE. FAMILY EDUCATED ON CARE NEEDS.  Summary of Stay    Date/Time Discharge Planning CSW  07/11/23 0915 Home with daughter and  granddaughter ot be assisting. Has DME from when here and never left. WOund vac in SW office and HH set up. May need family training prior to discharge. RGD       Katherine Archer

## 2023-07-13 NOTE — Consult Note (Signed)
 WOC Nurse wound follow up Wound type: surgical  Measurement: see prior note Wound azi:dlulmzd approximated with some areas not approximated at center of wound and inferior aspect of wound Drainage (amount, consistency, odor) serosanguinous Periwound: intact, erythema mid wound Dressing procedure/placement/frequency: Removed old NPWT dressing  Filled wound with  __3__ piece of black foam  Sealed NPWT dressing at , barrier ring utilized near the labia, inferior and lateral aspect of wound.  Patient tolerated procedure well  Patient placed on home unit in anticipation of discharge today.   WOC nurse will continue to provide NPWT dressing changed due to the complexity of the dressing change if patient remains inpatient.   Thank you,  Doyal Polite, RN, MSN, [facility] WOC Team

## 2023-07-14 DIAGNOSIS — Z89621 Acquired absence of right hip joint: Secondary | ICD-10-CM | POA: Diagnosis not present

## 2023-07-14 DIAGNOSIS — I2510 Atherosclerotic heart disease of native coronary artery without angina pectoris: Secondary | ICD-10-CM | POA: Diagnosis not present

## 2023-07-14 DIAGNOSIS — I739 Peripheral vascular disease, unspecified: Secondary | ICD-10-CM | POA: Diagnosis not present

## 2023-07-14 DIAGNOSIS — A419 Sepsis, unspecified organism: Secondary | ICD-10-CM | POA: Diagnosis not present

## 2023-07-14 DIAGNOSIS — Z89611 Acquired absence of right leg above knee: Secondary | ICD-10-CM | POA: Diagnosis not present

## 2023-07-14 DIAGNOSIS — I639 Cerebral infarction, unspecified: Secondary | ICD-10-CM | POA: Diagnosis not present

## 2023-07-14 DIAGNOSIS — S88112A Complete traumatic amputation at level between knee and ankle, left lower leg, initial encounter: Secondary | ICD-10-CM | POA: Diagnosis not present

## 2023-07-17 ENCOUNTER — Telehealth: Payer: Self-pay | Admitting: Orthopedic Surgery

## 2023-07-17 ENCOUNTER — Other Ambulatory Visit (HOSPITAL_COMMUNITY): Payer: Self-pay

## 2023-07-17 DIAGNOSIS — S31103A Unspecified open wound of abdominal wall, right lower quadrant without penetration into peritoneal cavity, initial encounter: Secondary | ICD-10-CM | POA: Diagnosis not present

## 2023-07-17 DIAGNOSIS — T8189XA Other complications of procedures, not elsewhere classified, initial encounter: Secondary | ICD-10-CM | POA: Diagnosis not present

## 2023-07-17 NOTE — Telephone Encounter (Signed)
 Pt has an appointment this week with Rocky, can discuss at that time and approval of referral.

## 2023-07-17 NOTE — Telephone Encounter (Signed)
 Melody called. A referral to an urologist is needed for patient. Her cb# [phone]

## 2023-07-18 ENCOUNTER — Other Ambulatory Visit: Payer: Self-pay | Admitting: Family

## 2023-07-18 ENCOUNTER — Encounter: Attending: Registered Nurse | Admitting: Registered Nurse

## 2023-07-18 VITALS — BP 101/53 | HR 63 | Ht 66.0 in | Wt 119.0 lb

## 2023-07-18 DIAGNOSIS — G894 Chronic pain syndrome: Secondary | ICD-10-CM | POA: Diagnosis not present

## 2023-07-18 DIAGNOSIS — I739 Peripheral vascular disease, unspecified: Secondary | ICD-10-CM | POA: Diagnosis not present

## 2023-07-18 DIAGNOSIS — Z89621 Acquired absence of right hip joint: Secondary | ICD-10-CM | POA: Diagnosis not present

## 2023-07-18 DIAGNOSIS — Z789 Other specified health status: Secondary | ICD-10-CM | POA: Diagnosis not present

## 2023-07-18 DIAGNOSIS — N819 Female genital prolapse, unspecified: Secondary | ICD-10-CM

## 2023-07-18 DIAGNOSIS — Z8619 Personal history of other infectious and parasitic diseases: Secondary | ICD-10-CM | POA: Diagnosis not present

## 2023-07-18 DIAGNOSIS — I10 Essential (primary) hypertension: Secondary | ICD-10-CM | POA: Insufficient documentation

## 2023-07-18 DIAGNOSIS — D62 Acute posthemorrhagic anemia: Secondary | ICD-10-CM | POA: Diagnosis not present

## 2023-07-18 DIAGNOSIS — Z79891 Long term (current) use of opiate analgesic: Secondary | ICD-10-CM | POA: Diagnosis not present

## 2023-07-18 DIAGNOSIS — Z6820 Body mass index (BMI) 20.0-20.9, adult: Secondary | ICD-10-CM | POA: Diagnosis not present

## 2023-07-18 DIAGNOSIS — Z5181 Encounter for therapeutic drug level monitoring: Secondary | ICD-10-CM | POA: Diagnosis not present

## 2023-07-18 MED ORDER — OXYCODONE HCL 10 MG PO TABS
10.0000 mg | ORAL_TABLET | Freq: Two times a day (BID) | ORAL | 0 refills | Status: DC | PRN
Start: 2023-07-18 — End: 2023-10-02

## 2023-07-18 MED ORDER — MORPHINE SULFATE ER 15 MG PO TBCR: 15.0000 mg | EXTENDED_RELEASE_TABLET | Freq: Two times a day (BID) | ORAL | 0 refills | Status: DC

## 2023-07-18 NOTE — Progress Notes (Unsigned)
 Subjective:    Patient ID: Katherine Archer, female    DOB: [birth year], 68 y.o.   MRN: [id number]  HPI: Katherine Archer is a 68 y.o. female who returns for follow up appointment for chronic pain and medication refill. states *** pain is located in  ***. rates pain ***. current exercise regime is walking and performing stretching exercises.   Pain Inventory Average Pain 4 Pain Right Now 1 My pain is sharp, burning, and aching  In the last 24 hours, has pain interfered with the following? General activity 4 Relation with others 2 Enjoyment of life 4 What TIME of day is your pain at its worst? morning  Sleep (in general) Good  Pain is worse with: walking and some activites Pain improves with: rest and medication Relief from Meds: 10  walk with assistance use a walker how many minutes can you walk? 5-10 ability to climb steps?  yes do you drive?  no use a wheelchair needs help with transfers  disabled: date disabled . retired I need assistance with the following:  bathing, toileting, meal prep, household duties, and shopping  tingling trouble walking  TC appt  TC appt    Family History  Problem Relation Age of Onset   Heart Problems Father    Heart Problems Brother    Heart Problems Brother    Social History   Socioeconomic History   Marital status: Divorced    Spouse name: Not on file   Number of children: Not on file   Years of education: Not on file   Highest education level: Not on file  Occupational History   Not on file  Tobacco Use   Smoking status: Former    Current packs/day: 0.00    Types: Cigarettes    Quit date: 10/04/2019    Years since quitting: 3.7    Passive exposure: Never   Smokeless tobacco: Never  Vaping Use   Vaping status: Never Used  Substance and Sexual Activity   Alcohol  use: No   Drug use: No   Sexual activity: Not Currently  Other Topics Concern   Not on file  Social History Narrative   Not on file   Social Drivers of Health    Financial Resource Strain: Not on file  Food Insecurity: No Food Insecurity (06/17/2023)   Hunger Vital Sign    Worried About Running Out of Food in the Last Year: Never true    Ran Out of Food in the Last Year: Never true  Transportation Needs: No Transportation Needs (06/17/2023)   PRAPARE - Administrator, Civil Service (Medical): No    Lack of Transportation (Non-Medical): No  Physical Activity: Not on file  Stress: Not on file  Social Connections: Socially Isolated (06/17/2023)   Social Connection and Isolation Panel    Frequency of Communication with Friends and Family: More than three times a week    Frequency of Social Gatherings with Friends and Family: More than three times a week    Attends Religious Services: Never    Database administrator or Organizations: No    Attends Banker Meetings: Never    Marital Status: Widowed   Past Surgical History:  Procedure Laterality Date   ABDOMINAL AORTOGRAM W/LOWER EXTREMITY Bilateral 08/23/2019   Procedure: ABDOMINAL AORTOGRAM W/ Bilateral LOWER EXTREMITY Runoff;  Surgeon: Harvey Carlin BRAVO, MD;  [facility];  Service: Cardiovascular;  Laterality: Bilateral;   AMPUTATION Right 05/31/2023   Procedure: AMPUTATION,  ABOVE KNEE SARTORIOUS MUSCLE FLAP;  Surgeon: Serene Gaile ORN, MD;  [facility]: [facility] OR;  Service: Vascular;  Laterality: Right;   APPLICATION OF WOUND VAC Right 05/31/2023   Procedure: APPLICATION, WOUND VAC;  Surgeon: Serene Gaile ORN, MD;  [facility];  Service: Vascular;  Laterality: Right;   APPLICATION OF WOUND VAC Right 06/16/2023   Procedure: APPLICATION, WOUND VAC;  Surgeon: Serene Gaile ORN, MD;  [facility];  Service: Vascular;  Laterality: Right;   APPLICATION OF WOUND VAC Right 06/21/2023   Procedure: APPLICATION, WOUND VAC;  Surgeon: Serene Gaile ORN, MD;  [facility];  Service: Vascular;  Laterality: Right;   APPLICATION, SKIN SUBSTITUTE Right 05/12/2023   Procedure:  APPLICATION, SKIN SUBSTITUTE KERECIS 38SQ CM;  Surgeon: Serene Gaile ORN, MD;  [facility];  Service: Vascular;  Laterality: Right;   APPLICATION, SKIN SUBSTITUTE Right 06/16/2023   Procedure: APPLICATION, SKIN SUBSTITUTE;  Surgeon: Serene Gaile ORN, MD;  [facility];  Service: Vascular;  Laterality: Right;   APPLICATION, SKIN SUBSTITUTE Right 06/21/2023   Procedure: APPLICATION, SKIN SUBSTITUTE;  Surgeon: Serene Gaile ORN, MD;  [facility];  Service: Vascular;  Laterality: Right;   AVGG REMOVAL Right 05/29/2023   Procedure: REMOVAL BYPASS GRAFT RIGHT LEG;  Surgeon: Lanis Fonda BRAVO, MD;  Location: [facility] OR;  Service: Vascular;  Laterality: Right;   AXILLARY-FEMORAL BYPASS GRAFT Right 11/11/2019   Procedure: BYPASS GRAFT RIGHT AXILLA-BIFEMORAL;  Surgeon: Harvey Carlin BRAVO, MD;  [facility];  Service: Vascular;  Laterality: Right;   AXILLARY-FEMORAL BYPASS GRAFT Left 06/21/2023   Procedure: CREATION, BYPASS, ARTERIAL, LEFT AXILLARY TO LEFT FEMORAL, USING GRAFT;  Surgeon: Serene Gaile ORN, MD;  [facility];  Service: Vascular;  Laterality: Left;   BRAIN SURGERY     BREAST SURGERY Bilateral 2014   1980 R breast surgery   CEREBRAL ANEURYSM REPAIR     ENDARTERECTOMY FEMORAL Bilateral 11/11/2019   Procedure: ENDARTERECTOMY FEMORAL BILATERALLY;  Surgeon: Harvey Carlin BRAVO, MD;  Location[facility];  Service: Vascular;  Laterality: Bilateral;   ENDARTERECTOMY FEMORAL Right 01/26/2022   Procedure: REDO RIGHT COMMON FEMORAL ENDARTERECTOMY USING 0.8CM X 7.6CM HEMASHIELD PLATINUM FINESSE PATCH;  Surgeon: Serene Gaile ORN, MD;  [facility];  Service: Vascular;  Laterality: Right;   FEMORAL-POPLITEAL BYPASS GRAFT Right 01/26/2022   Procedure: RIGHT FEMORAL-POPLITEAL BYPASS GRAFT USING GORE X 80CM PROPATEN GRAFT;  Surgeon: Serene Gaile ORN, MD;  [facility];  Service: Vascular;  Laterality: Right;   FEMORAL-POPLITEAL BYPASS GRAFT Right 05/12/2023   Procedure: BYPASS GRAFT FEMORAL-POPLITEAL  ARTERY;  Surgeon: Serene Gaile ORN, MD;  Location: [facility] OR;  Service: Vascular;  Laterality: Right;   HIP DISARTICULATION Right 06/28/2023   Procedure: MARGA, HIP;  Surgeon: Harden Jerona GAILS, MD;  Location: [facility] OR;  Service: Orthopedics;  Laterality: Right;   INCISION AND DRAINAGE OF WOUND Right 05/31/2023   Procedure: IRRIGATION AND DEBRIDEMENT WOUND;  Surgeon: Serene Gaile ORN, MD;  [facility];  Service: Vascular;  Laterality: Right;  RIGHT GROIN WASHOUT   INCISION AND DRAINAGE OF WOUND Right 06/16/2023   Procedure: IRRIGATION AND DEBRIDEMENT WOUND;  Surgeon: Serene Gaile ORN, MD;  [facility];  Service: Vascular;  Laterality: Right;  Right Groin   INCISION AND DRAINAGE OF WOUND Right 06/21/2023   Procedure: IRRIGATION AND DEBRIDEMENT OF RIGHT GROIN AND RIGHT ABOVE KNEE AMPUTATION;  Surgeon: Serene Gaile ORN, MD;  [facility];  Service: Vascular;  Laterality: Right;   IR RADIOLOGIST EVAL &  MGMT  04/02/2020   IR RADIOLOGIST EVAL & MGMT  03/29/2021   REMOVAL OF GRAFT Right 06/21/2023   Procedure: REMOVAL, GRAFT, RIGHT AXILLARY BIFEMORAL GRAFT;  Surgeon: Serene Gaile ORN, MD;  [facility];  Service: Vascular;  Laterality: Right;   REVISION AMPUTATION, BELOW THE KNEE Right 06/16/2023   Procedure: REVISION AMPUTATION, ABOVE THE KNEE;  Surgeon: Serene Gaile ORN, MD;  [facility];  Service: Vascular;  Laterality: Right;   THROMBECTOMY FEMORAL ARTERY Right 01/26/2022   Procedure: THROMBECTOMY RIGHT FEMORAL ARTERY;  Surgeon: Serene Gaile ORN, MD;  [facility];  Service: Vascular;  Laterality: Right;   THROMBECTOMY OF BYPASS GRAFT FEMORAL- TIBIAL ARTERY Right 05/12/2023   Procedure: THROMBECTOMY, BYPASS GRAFT, ARTERIAL;  Surgeon: Serene Gaile ORN, MD;  [facility];  Service: Vascular;  Laterality: Right;   TUBAL LIGATION  1990   Past Medical History:  Diagnosis Date   Cervical polyp 09/18/2018   Critical lower limb ischemia (HCC)    Encounter for screening mammogram for breast  cancer 05/05/2016   Fibrocystic breast changes of both breasts 04/29/2015   Hyperlipidemia    Hypertension    PAD (peripheral artery disease) (HCC) 08/23/2019   Pelvic prolapse 06/2020   Pelvic ring in place, needs to be changed every 3 months   Stroke (HCC) 08/23/2019   BP (!) 101/53 (BP Location: Right Arm, Patient Position: Sitting, Cuff Size: Normal)   Pulse 63   Ht 5' 6 (1.676 m)   Wt 119 lb (54 kg)   SpO2 98%   BMI 19.21 kg/m   Opioid Risk Score:   Fall Risk Score:  `1  Depression screen [facility] 2/9     07/18/2023    9:44 AM 06/29/2020   10:59 AM  Depression screen PHQ 2/9  Decreased Interest 0 0  Down, Depressed, Hopeless 1 0  PHQ - 2 Score 1 0  Altered sleeping 0   Tired, decreased energy 0   Change in appetite 1   Feeling bad or failure about yourself  0   Trouble concentrating 0   Moving slowly or fidgety/restless 0   Suicidal thoughts 0   PHQ-9 Score 2   Difficult doing work/chores Not difficult at all       Review of Systems  Genitourinary:  Positive for pelvic pain.  All other systems reviewed and are negative.      Objective:   Physical Exam        Assessment & Plan:

## 2023-07-19 ENCOUNTER — Ambulatory Visit (INDEPENDENT_AMBULATORY_CARE_PROVIDER_SITE_OTHER): Admitting: Family

## 2023-07-19 ENCOUNTER — Encounter: Payer: Self-pay | Admitting: Family

## 2023-07-19 ENCOUNTER — Encounter: Payer: Self-pay | Admitting: Registered Nurse

## 2023-07-19 DIAGNOSIS — Z960 Presence of urogenital implants: Secondary | ICD-10-CM

## 2023-07-19 DIAGNOSIS — N819 Female genital prolapse, unspecified: Secondary | ICD-10-CM

## 2023-07-19 NOTE — Progress Notes (Unsigned)
 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  She is status post multiple orthopedic as well as vascular procedures most recently had removal of her right axillary bifemoral graft June 16 Staples are in place Patient independently transfers from bed to chair - chair to bed Ortho Exam On examination of the right hip and right lower quadrant area there are sutures and staples in place the incision is approximated there are scattered areas of fibrinous tissue within the incision there is no frank dehiscence there is no drainage no surrounding erythema or warmth no odor  Harvested as many sutures and staples as patient can tolerate.  Visit Diagnoses: No diagnosis found.  Plan: Continue daily dose of cleansing dry dressings Allevyn foam dressings applied today she will follow-up in the office in about 10 days for remaining suture removal  Follow-Up Instructions: Return in about 2 weeks (around 08/02/2023).   Imaging: No results found.  Orders:  No orders of the defined types were placed in this encounter.  No orders of the defined types were placed in this encounter.    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   Elevated CK 06/06/2023   Normocytic anemia 06/04/2023   Vascular graft infection (HCC) 06/01/2023   Septic shock due to group A strep bacteremia and MRSA graft infection due to postsurgical femoropopliteal bypass graft infection (HCC) 05/29/2023   Thrombocytopenia (HCC) 05/12/2023   Statin myopathy 06/15/2022   Critical limb ischemia of right lower extremity (HCC) 01/26/2022   Critical lower limb ischemia (HCC)    Coronary artery disease involving native coronary artery of native heart without angina pectoris 10/03/2019   Essential hypertension 10/03/2019   Mixed hyperlipidemia 10/03/2019   Hyperlipidemia    Hypertension    Stroke (HCC) 08/23/2019   PAD (peripheral artery disease) (HCC) 08/23/2019   Cervical polyp 09/18/2018   Encounter for screening mammogram for breast cancer 05/05/2016   Fibrocystic breast changes of both breasts 04/29/2015   Past Medical History:  Diagnosis Date   Cervical polyp 09/18/2018   Critical lower limb ischemia (HCC)    Encounter for screening mammogram for breast cancer 05/05/2016   Fibrocystic breast changes of both breasts 04/29/2015   Hyperlipidemia    Hypertension    PAD (peripheral artery disease) (HCC) 08/23/2019   Pelvic prolapse 06/2020   Pelvic ring in place, needs to be changed every 3 months   Stroke (HCC) 08/23/2019    Family History  Problem Relation Age of Onset   Heart Problems Father    Heart Problems Brother    Heart Problems Brother  Past Surgical History:  Procedure Laterality Date   ABDOMINAL AORTOGRAM W/LOWER EXTREMITY Bilateral 08/23/2019   Procedure: ABDOMINAL AORTOGRAM W/ Bilateral LOWER EXTREMITY Runoff;  Surgeon: Harvey Carlin BRAVO, MD;  [facility];  Service: Cardiovascular;  Laterality: Bilateral;   AMPUTATION Right 05/31/2023   Procedure: AMPUTATION, ABOVE KNEE SARTORIOUS MUSCLE FLAP;  Surgeon: Serene Gaile ORN, MD;  [facility];  Service:  Vascular;  Laterality: Right;   APPLICATION OF WOUND VAC Right 05/31/2023   Procedure: APPLICATION, WOUND VAC;  Surgeon: Serene Gaile ORN, MD;  [facility];  Service: Vascular;  Laterality: Right;   APPLICATION OF WOUND VAC Right 06/16/2023   Procedure: APPLICATION, WOUND VAC;  Surgeon: Serene Gaile ORN, MD;  [facility];  Service: Vascular;  Laterality: Right;   APPLICATION OF WOUND VAC Right 06/21/2023   Procedure: APPLICATION, WOUND VAC;  Surgeon: Serene Gaile ORN, MD;  [facility];  Service: Vascular;  Laterality: Right;   APPLICATION, SKIN SUBSTITUTE Right 05/12/2023   Procedure: APPLICATION, SKIN SUBSTITUTE KERECIS 38SQ CM;  Surgeon: Serene Gaile ORN, MD;  [facility];  Service: Vascular;  Laterality: Right;   APPLICATION, SKIN SUBSTITUTE Right 06/16/2023   Procedure: APPLICATION, SKIN SUBSTITUTE;  Surgeon: Serene Gaile ORN, MD;  [facility];  Service: Vascular;  Laterality: Right;   APPLICATION, SKIN SUBSTITUTE Right 06/21/2023   Procedure: APPLICATION, SKIN SUBSTITUTE;  Surgeon: Serene Gaile ORN, MD;  [facility];  Service: Vascular;  Laterality: Right;   AVGG REMOVAL Right 05/29/2023   Procedure: REMOVAL BYPASS GRAFT RIGHT LEG;  Surgeon: Lanis Fonda BRAVO, MD;  Location: [facility] OR;  Service: Vascular;  Laterality: Right;   AXILLARY-FEMORAL BYPASS GRAFT Right 11/11/2019   Procedure: BYPASS GRAFT RIGHT AXILLA-BIFEMORAL;  Surgeon: Harvey Carlin BRAVO, MD;  [facility];  Service: Vascular;  Laterality: Right;   AXILLARY-FEMORAL BYPASS GRAFT Left 06/21/2023   Procedure: CREATION, BYPASS, ARTERIAL, LEFT AXILLARY TO LEFT FEMORAL, USING GRAFT;  Surgeon: Serene Gaile ORN, MD;  [facility];  Service: Vascular;  Laterality: Left;   BRAIN SURGERY     BREAST SURGERY Bilateral 2014   1980 R breast surgery   CEREBRAL ANEURYSM REPAIR     ENDARTERECTOMY FEMORAL Bilateral 11/11/2019   Procedure: ENDARTERECTOMY FEMORAL BILATERALLY;  Surgeon: Harvey Carlin BRAVO, MD;  Location: [facility] OR;   Service: Vascular;  Laterality: Bilateral;   ENDARTERECTOMY FEMORAL Right 01/26/2022   Procedure: REDO RIGHT COMMON FEMORAL ENDARTERECTOMY USING 0.8CM X 7.6CM HEMASHIELD PLATINUM FINESSE PATCH;  Surgeon: Serene Gaile ORN, MD;  [facility];  Service: Vascular;  Laterality: Right;   FEMORAL-POPLITEAL BYPASS GRAFT Right 01/26/2022   Procedure: RIGHT FEMORAL-POPLITEAL BYPASS GRAFT USING GORE X 80CM PROPATEN GRAFT;  Surgeon: Serene Gaile ORN, MD;  [facility];  Service: Vascular;  Laterality: Right;   FEMORAL-POPLITEAL BYPASS GRAFT Right 05/12/2023   Procedure: BYPASS GRAFT FEMORAL-POPLITEAL ARTERY;  Surgeon: Serene Gaile ORN, MD;  Location: [facility] OR;  Service: Vascular;  Laterality: Right;   HIP DISARTICULATION Right 06/28/2023   Procedure: MARGA, HIP;  Surgeon: Harden Jerona GAILS, MD;  Location: [facility] OR;  Service: Orthopedics;  Laterality: Right;   INCISION AND DRAINAGE OF WOUND Right 05/31/2023   Procedure: IRRIGATION AND DEBRIDEMENT WOUND;  Surgeon: Serene Gaile ORN, MD;  [facility];  Service: Vascular;  Laterality: Right;  RIGHT GROIN WASHOUT   INCISION AND DRAINAGE OF WOUND Right 06/16/2023   Procedure: IRRIGATION AND DEBRIDEMENT WOUND;  Surgeon: Serene Gaile ORN, MD;  [facility];  Service:  Vascular;  Laterality: Right;  Right Groin   INCISION AND DRAINAGE OF WOUND Right 06/21/2023   Procedure: IRRIGATION AND DEBRIDEMENT OF RIGHT GROIN AND RIGHT ABOVE KNEE AMPUTATION;  Surgeon: Serene Gaile ORN, MD;  [facility];  Service: Vascular;  Laterality: Right;   IR RADIOLOGIST EVAL & MGMT  04/02/2020   IR RADIOLOGIST EVAL & MGMT  03/29/2021   REMOVAL OF GRAFT Right 06/21/2023   Procedure: REMOVAL, GRAFT, RIGHT AXILLARY BIFEMORAL GRAFT;  Surgeon: Serene Gaile ORN, MD;  [facility];  Service: Vascular;  Laterality: Right;   REVISION AMPUTATION, BELOW THE KNEE Right 06/16/2023   Procedure: REVISION AMPUTATION, ABOVE THE KNEE;  Surgeon: Serene Gaile ORN, MD;  [facility];  Service:  Vascular;  Laterality: Right;   THROMBECTOMY FEMORAL ARTERY Right 01/26/2022   Procedure: THROMBECTOMY RIGHT FEMORAL ARTERY;  Surgeon: Serene Gaile ORN, MD;  [facility];  Service: Vascular;  Laterality: Right;   THROMBECTOMY OF BYPASS GRAFT FEMORAL- TIBIAL ARTERY Right 05/12/2023   Procedure: THROMBECTOMY, BYPASS GRAFT, ARTERIAL;  Surgeon: Serene Gaile ORN, MD;  [facility];  Service: Vascular;  Laterality: Right;   TUBAL LIGATION  1990   Social History   Occupational History   Not on file  Tobacco Use   Smoking status: Former    Current packs/day: 0.00    Types: Cigarettes    Quit date: 10/04/2019    Years since quitting: 3.7    Passive exposure: Never   Smokeless tobacco: Never  Vaping Use   Vaping status: Never Used  Substance and Sexual Activity   Alcohol  use: No   Drug use: No   Sexual activity: Not Currently

## 2023-07-20 ENCOUNTER — Ambulatory Visit: Admitting: Infectious Diseases

## 2023-07-20 ENCOUNTER — Telehealth: Payer: Self-pay

## 2023-07-20 ENCOUNTER — Inpatient Hospital Stay: Admitting: Infectious Diseases

## 2023-07-20 ENCOUNTER — Other Ambulatory Visit: Payer: Self-pay

## 2023-07-20 VITALS — BP 132/54 | HR 65 | Temp 98.3°F

## 2023-07-20 DIAGNOSIS — A4902 Methicillin resistant Staphylococcus aureus infection, unspecified site: Secondary | ICD-10-CM

## 2023-07-20 DIAGNOSIS — A491 Streptococcal infection, unspecified site: Secondary | ICD-10-CM

## 2023-07-20 DIAGNOSIS — Z79899 Other long term (current) drug therapy: Secondary | ICD-10-CM

## 2023-07-20 DIAGNOSIS — T8130XA Disruption of wound, unspecified, initial encounter: Secondary | ICD-10-CM

## 2023-07-20 HISTORY — DX: Streptococcal infection, unspecified site: A49.1

## 2023-07-20 HISTORY — DX: Other long term (current) drug therapy: Z79.899

## 2023-07-20 HISTORY — DX: Disruption of wound, unspecified, initial encounter: T81.30XA

## 2023-07-20 HISTORY — DX: Methicillin resistant Staphylococcus aureus infection, unspecified site: A49.02

## 2023-07-20 NOTE — Telephone Encounter (Signed)
 Starla RN [facility] called [facility]. Would like to know what dressing can she put on patient. She is having drainage. States she had a Visit with Erin yesterday. Would like a CB to discuss. She is okay by getting a verbal order.        CB: [phone]

## 2023-07-20 NOTE — Telephone Encounter (Signed)
 SW Dan gave her VO for wet to dry dressing changes daily.

## 2023-07-20 NOTE — Progress Notes (Addendum)
 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   Prolapse of female pelvic organs 06/29/2023   Dehiscence of amputation stump of right lower extremity (HCC) 06/24/2023   Atherosclerosis of native arteries of extremities with gangrene, right leg (HCC) 06/24/2023   Surgical site infection 06/16/2023   Protein-calorie malnutrition, severe 06/10/2023   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   Elevated CK 06/06/2023   Normocytic anemia 06/04/2023   Vascular graft infection (HCC) 06/01/2023   Septic shock due to group A strep bacteremia and MRSA graft infection due to postsurgical femoropopliteal bypass graft infection (HCC) 05/29/2023   Thrombocytopenia (HCC) 05/12/2023   Statin myopathy 06/15/2022   Critical limb ischemia of right lower extremity (HCC) 01/26/2022   Critical lower limb ischemia (HCC)    Coronary artery disease involving native coronary artery of native heart without angina pectoris 10/03/2019   Essential hypertension 10/03/2019   Mixed hyperlipidemia 10/03/2019   Hyperlipidemia    Hypertension    Stroke (HCC) 08/23/2019   PAD (peripheral artery disease) (HCC) 08/23/2019   Cervical polyp 09/18/2018   Encounter for screening mammogram for breast cancer 05/05/2016   Fibrocystic breast changes of both breasts 04/29/2015   Current Outpatient Medications on File Prior to Visit  Medication Sig Dispense Refill   acetaminophen  (TYLENOL ) 325 MG tablet Take 1 tablet (325 mg total) by mouth every 4 (four) hours as needed for mild pain (pain score 1-3). 200 tablet 0   amLODipine  (NORVASC ) 5 MG tablet Take 1 tablet (5 mg total) by mouth daily. 30 tablet 0    ascorbic acid  (VITAMIN C ) 500 MG tablet Take 0.5 tablets (250 mg total) by mouth 2 (two) times daily. 30 tablet 0   aspirin  EC 81 MG tablet Take 1 tablet (81 mg total) by mouth daily. Swallow whole. 30 tablet 12   ezetimibe  (ZETIA ) 10 MG tablet Take 1 tablet (10 mg total) by mouth daily. 30 tablet 0   Fe Fum-Vit C-Vit B12-FA (TRIGELS-F FORTE) CAPS capsule Take 1 capsule by mouth daily with lunch. 30 capsule 0   feeding supplement (ENSURE PLUS HIGH PROTEIN) LIQD Take 237 mLs by mouth 2 (two) times daily between meals. 10000 mL 0   gabapentin  (NEURONTIN ) 400 MG capsule Take 1 capsule (400 mg total) by mouth 3 (three) times daily. 90 capsule 0   hydrALAZINE  (APRESOLINE ) 25 MG tablet Take 1 tablet (25 mg total) by mouth every 8 (eight) hours. 90 tablet 0   linezolid  (ZYVOX ) 600 MG tablet Take 1 tablet (600 mg total) by mouth every 12 (twelve) hours. 42 tablet 0   melatonin 5 MG TABS Take 1 tablet (5 mg total) by mouth at bedtime as needed. 30 tablet 0   metoprolol  succinate (TOPROL -XL) 25 MG 24 hr tablet Take 0.5 tablets (12.5 mg total) by mouth daily. 15 tablet 0   morphine  (MS CONTIN ) 15 MG 12 hr tablet Take 1 tablet (15 mg total) by mouth every 12 (twelve) hours. 60 tablet 0   Multiple Vitamin (MULTIVITAMIN WITH MINERALS) TABS tablet Take 1 tablet by mouth daily. 30 tablet 0   Oxycodone  HCl 10 MG TABS Take 1 tablet (10 mg total) by mouth 2 (two) times daily as needed (break  through pain). 60 tablet 0   oxymetazoline  (AFRIN) 0.05 % nasal spray Place 1 spray into both nostrils 2 (two) times daily as needed (nosebleeds). 30 mL 0   pantoprazole  (PROTONIX ) 40 MG tablet Take 1 tablet (40 mg total) by mouth daily. 30 tablet 0   polyethylene glycol powder (GLYCOLAX /MIRALAX ) 17 GM/SCOOP powder Take 17 g by mouth daily. 238 g 0   senna-docusate (SENOKOT-S) 8.6-50 MG tablet Take 2 tablets by mouth daily with supper. 60 tablet 0   No current facility-administered medications on file prior to visit.    Subjective: Discussed the use of AI scribe software for clinical note transcription with the patient, who gave verbal consent to proceed.   68 Y O Female with prior h/o HTN, CVA, HLD, PAD w critical limb ischemia s/p vascular interventions complicated with Left groin AVG infection ( Group A Strep and MRSA) s/p multiple vascular interventions including AKA with eventual removal of complete removal of rt axillary fem graft, rt to left fem fem bypass graft ( confirmed with Vascular) + placement of  left fem to common femoral artery bypass graft, finally leading to Rt hip disarticulation for source control ( no concerns for remaining infection per Dr Harden). Seen by ID and recommended 6 weeks of antibiotics, initially on daptomycin  then transitioned to PO linezolid  during the later half of course through 7/9. She was discharged to rehab on 7/1. She was noted to have wound drainage during rehab admission, evaluated by Dr Harden agreste of wound vac and Doxycycline  added for a week. Patient was discharged home rehab with 3 weeks of PO linezolid  per d/w Dr Harden.    7/17 Accompanied by grand daughter. Spoke with interpreter. Completed doxycycline  on July 11th, taking PO linezolid  now. Seen by Ortho on 7/16, Wound looked approximated on 7/16 but seems to have dehisced today. Very minimal drainage. Denies fevers, chills. Denies nausea, vomiting or diarrhea.  Grandddaughter calling Dr Crist office for wound dehiscence. Told to to go to ED if any signs of infection   Review of Systems: all systems reviewed with pertinent positives and negatives as listed above   Past Medical History:  Diagnosis Date   Cervical polyp 09/18/2018   Critical lower limb ischemia (HCC)    Encounter for screening mammogram for breast cancer 05/05/2016   Fibrocystic breast changes of both breasts 04/29/2015   Hyperlipidemia    Hypertension    PAD (peripheral artery disease) (HCC) 08/23/2019   Pelvic prolapse 06/2020   Pelvic  ring in place, needs to be changed every 3 months   Stroke (HCC) 08/23/2019   Past Surgical History:  Procedure Laterality Date   ABDOMINAL AORTOGRAM W/LOWER EXTREMITY Bilateral 08/23/2019   Procedure: ABDOMINAL AORTOGRAM W/ Bilateral LOWER EXTREMITY [place];  Surgeon: Harvey Carlin BRAVO, MD;  Location: [facility] INVASIVE CV LAB;  Service: Cardiovascular;  Laterality: Bilateral;   AMPUTATION Right 05/31/2023   Procedure: AMPUTATION, ABOVE KNEE SARTORIOUS MUSCLE FLAP;  Surgeon: Serene Gaile ORN, MD;  Location: [facility] OR;  Service: Vascular;  Laterality: Right;   APPLICATION OF WOUND VAC Right 05/31/2023   Procedure: APPLICATION, WOUND VAC;  Surgeon: Serene Gaile ORN, MD;  Location: [facility] OR;  Service: Vascular;  Laterality: Right;   APPLICATION OF WOUND VAC Right 06/16/2023   Procedure: APPLICATION, WOUND VAC;  Surgeon: Serene Gaile ORN, MD;  Location: [facility] OR;  Service: Vascular;  Laterality: Right;   APPLICATION OF WOUND VAC Right 06/21/2023   Procedure: APPLICATION, WOUND VAC;  Surgeon: Serene Gaile ORN, MD;  Location: [facility]  OR;  Service: Vascular;  Laterality: Right;   APPLICATION, SKIN SUBSTITUTE Right 05/12/2023   Procedure: APPLICATION, SKIN SUBSTITUTE KERECIS 38SQ CM;  Surgeon: Serene Gaile ORN, MD;  Location: [facility] OR;  Service: Vascular;  Laterality: Right;   APPLICATION, SKIN SUBSTITUTE Right 06/16/2023   Procedure: APPLICATION, SKIN SUBSTITUTE;  Surgeon: Serene Gaile ORN, MD;  Location: [facility] OR;  Service: Vascular;  Laterality: Right;   APPLICATION, SKIN SUBSTITUTE Right 06/21/2023   Procedure: APPLICATION, SKIN SUBSTITUTE;  Surgeon: Serene Gaile ORN, MD;  Location: [facility] OR;  Service: Vascular;  Laterality: Right;   AVGG REMOVAL Right 05/29/2023   Procedure: REMOVAL BYPASS GRAFT RIGHT LEG;  Surgeon: Lanis Fonda BRAVO, MD;  Location: [facility] OR;  Service: Vascular;  Laterality: Right;   AXILLARY-FEMORAL BYPASS GRAFT Right 11/11/2019   Procedure: BYPASS GRAFT RIGHT AXILLA-BIFEMORAL;  Surgeon: Harvey Carlin BRAVO, MD;  Location:  [facility] OR;  Service: Vascular;  Laterality: Right;   AXILLARY-FEMORAL BYPASS GRAFT Left 06/21/2023   Procedure: CREATION, BYPASS, ARTERIAL, LEFT AXILLARY TO LEFT FEMORAL, USING GRAFT;  Surgeon: Serene Gaile ORN, MD;  Location: [facility] OR;  Service: Vascular;  Laterality: Left;   BRAIN SURGERY     BREAST SURGERY Bilateral 2014   1980 R breast surgery   CEREBRAL ANEURYSM REPAIR     ENDARTERECTOMY FEMORAL Bilateral 11/11/2019   Procedure: ENDARTERECTOMY FEMORAL BILATERALLY;  Surgeon: Harvey Carlin BRAVO, MD;  Location: [facility] OR;  Service: Vascular;  Laterality: Bilateral;   ENDARTERECTOMY FEMORAL Right 01/26/2022   Procedure: REDO RIGHT COMMON FEMORAL ENDARTERECTOMY USING 0.8CM X 7.6CM HEMASHIELD PLATINUM FINESSE PATCH;  Surgeon: Serene Gaile ORN, MD;  Location: [facility] OR;  Service: Vascular;  Laterality: Right;   FEMORAL-POPLITEAL BYPASS GRAFT Right 01/26/2022   Procedure: RIGHT FEMORAL-POPLITEAL BYPASS GRAFT USING GORE X 80CM PROPATEN GRAFT;  Surgeon: Serene Gaile ORN, MD;  Location: [facility] OR;  Service: Vascular;  Laterality: Right;   FEMORAL-POPLITEAL BYPASS GRAFT Right 05/12/2023   Procedure: BYPASS GRAFT FEMORAL-POPLITEAL ARTERY;  Surgeon: Serene Gaile ORN, MD;  Location: [facility] OR;  Service: Vascular;  Laterality: Right;   HIP DISARTICULATION Right 06/28/2023   Procedure: MARGA, HIP;  Surgeon: Harden Jerona GAILS, MD;  Location: [facility] OR;  Service: Orthopedics;  Laterality: Right;   INCISION AND DRAINAGE OF WOUND Right 05/31/2023   Procedure: IRRIGATION AND DEBRIDEMENT WOUND;  Surgeon: Serene Gaile ORN, MD;  Location: [facility] OR;  Service: Vascular;  Laterality: Right;  RIGHT GROIN WASHOUT   INCISION AND DRAINAGE OF WOUND Right 06/16/2023   Procedure: IRRIGATION AND DEBRIDEMENT WOUND;  Surgeon: Serene Gaile ORN, MD;  Location: [facility] OR;  Service: Vascular;  Laterality: Right;  Right Groin   INCISION AND DRAINAGE OF WOUND Right 06/21/2023   Procedure: IRRIGATION AND DEBRIDEMENT OF RIGHT GROIN AND RIGHT ABOVE KNEE AMPUTATION;   Surgeon: Serene Gaile ORN, MD;  Location: [facility] OR;  Service: Vascular;  Laterality: Right;   IR RADIOLOGIST EVAL & MGMT  04/02/2020   IR RADIOLOGIST EVAL & MGMT  03/29/2021   REMOVAL OF GRAFT Right 06/21/2023   Procedure: REMOVAL, GRAFT, RIGHT AXILLARY BIFEMORAL GRAFT;  Surgeon: Serene Gaile ORN, MD;  Location: [facility] OR;  Service: Vascular;  Laterality: Right;   REVISION AMPUTATION, BELOW THE KNEE Right 06/16/2023   Procedure: REVISION AMPUTATION, ABOVE THE KNEE;  Surgeon: Serene Gaile ORN, MD;  Location: [facility] OR;  Service: Vascular;  Laterality: Right;   THROMBECTOMY FEMORAL ARTERY Right 01/26/2022   Procedure: THROMBECTOMY RIGHT FEMORAL ARTERY;  Surgeon: Serene Gaile ORN, MD;  Location: [facility] OR;  Service: Vascular;  Laterality: Right;   THROMBECTOMY OF BYPASS GRAFT FEMORAL- TIBIAL ARTERY Right 05/12/2023   Procedure: THROMBECTOMY, BYPASS GRAFT, ARTERIAL;  Surgeon: Serene Gaile ORN, MD;  Location: [facility] OR;  Service: Vascular;  Laterality: Right;   TUBAL LIGATION  1990    Social History   Tobacco Use   Smoking status: Former    Current packs/day: 0.00    Types: Cigarettes    Quit date: 10/04/2019    Years since quitting: 3.7    Passive exposure: Never   Smokeless tobacco: Never  Vaping Use   Vaping status: Never Used  Substance Use Topics   Alcohol  use: No   Drug use: No    Family History  Problem Relation Age of Onset   Heart Problems Father    Heart Problems Brother    Heart Problems Brother     Allergies  Allergen Reactions   Daptomycin      Elevated CK   Penicillins Other (See Comments)    Shaky and Dizziness 20 years ago    Health Maintenance  Topic Date Due   Medicare Annual Wellness (AWV)  Never done   COVID-19 Vaccine (1) Never done   Hepatitis C Screening  Never done   DTaP/Tdap/Td (1 - Tdap) Never done   Pneumococcal Vaccine: 50+ Years (1 of 2 - PCV) Never done   Zoster Vaccines- Shingrix (1 of 2) Never done   Colonoscopy  Never done   MAMMOGRAM  Never done   DEXA SCAN   Never done   INFLUENZA VACCINE  08/04/2023   Hepatitis B Vaccines  Aged Out   HPV VACCINES  Aged Out   Meningococcal B Vaccine  Aged Out    Objective: BP (!) 132/54   Pulse 65   Temp 98.3 F (36.8 C) (Oral)   SpO2 100%   Physical Exam Constitutional:      Appearance: Normal appearance.  HENT:     Head: Normocephalic and atraumatic.      Mouth: Mucous membranes are moist.  Eyes:    Conjunctiva/sclera: Conjunctivae normal.     Pupils: Pupils are equal, round, and b/l symmetrical    Cardiovascular:     Rate and Rhythm: Normal rate and regular rhythm.     Heart sounds:   Pulmonary:     Effort: Pulmonary effort is normal.     Breath sounds:   Abdominal:     General: Non distended     Palpations:  Musculoskeletal:        General: sitting in the wheelchair   Rt hip disarticulation site seems to be healing, no signs of infection or drainage   Rt lower abdominal wound, dehisced at some areas, fibrinous exudate, no purulence, no surrounding cellulitis or fluctuance    Skin:    General: Skin is warm and dry.     Comments:  Neurological:     General: grossly non focal     Mental Status: awake, alert and oriented to person, place, and time.   Psychiatric:        Mood and Affect: Mood normal.   Lab Results Lab Results  Component Value Date   WBC 7.7 07/10/2023   HGB 8.8 (L) 07/10/2023   HCT 28.3 (L) 07/10/2023   MCV 92.8 07/10/2023   PLT 293 07/10/2023    Lab Results  Component Value Date   CREATININE 0.57 07/10/2023   BUN 23 07/10/2023   NA 133 (L) 07/10/2023   K 4.0 07/10/2023   CL 101 07/10/2023   CO2  23 07/10/2023    Lab Results  Component Value Date   ALT 29 07/10/2023   AST 20 07/10/2023   ALKPHOS 76 07/10/2023   BILITOT 0.3 07/10/2023    Lab Results  Component Value Date   CHOL 133 01/27/2022   HDL 36 (L) 01/27/2022   LDLCALC 85 01/27/2022   TRIG 59 01/27/2022   CHOLHDL 3.7 01/27/2022   No results found for: LABRPR, RPRTITER No  results found for: HIV1RNAQUANT, HIV1RNAVL, CD4TABS   Assessment/Plan # Recent MRSA and Group A Rt LE AVG infection s/p rt hip disarticulation complicated  # RT lower abdominal wound dehiscence  # PAD - IV daptomycin  through 6/27> PO linezolid  till date (was briefly on PO doxycyline completed on 7/11)  Plan  - continue PO Linezolid  as is - Labs today for CBC check - informed Ortho PA about wound exam findings. Fu with ortho - discussed with patient to go to ED if any signs of sepsis like fevers, chills, purulent drainage, malaise. etc - wound care - fu in 2 weeks   I spent 31 minutes involved in face-to-face and non-face-to-face activities for this patient on the day of the visit. Professional time spent includes the following activities: Preparing to see the patient (review of tests), Obtaining and  reviewing separately obtained history (discharge record 7/10, Ortho notes 7/16, PMR notes 7/15), Performing a medically appropriate examination and evaluation, communicating with Ortho PA Katherine Archer, Ordering labs,Documenting clinical information in the EMR, Independently interpreting results (not separately reported), Communicating results to the patient/family member, Counseling and educating the patient/member and Care coordination (not separately reported).   Of note, portions of this note may have been created with voice recognition software. While this note has been edited for accuracy, occasional wrong-word or 'sound-a-like' substitutions may have occurred due to the inherent limitations of voice recognition software.   Katherine Joseph, MD [facility] 07/20/2023, 11:28 AM

## 2023-07-21 LAB — DRUG TOX MONITOR 1 W/CONF, ORAL FLD
Amphetamines: NEGATIVE ng/mL (ref <10)
Barbiturates: NEGATIVE ng/mL (ref <10)
Benzodiazepines: NEGATIVE ng/mL (ref <0.50)
Buprenorphine: NEGATIVE ng/mL (ref <0.10)
Cocaine: NEGATIVE ng/mL (ref <5.0)
Codeine: NEGATIVE ng/mL (ref <2.5)
Dihydrocodeine: NEGATIVE ng/mL (ref <2.5)
Fentanyl: NEGATIVE ng/mL (ref <0.10)
Heroin Metabolite: NEGATIVE ng/mL (ref <1.0)
Hydrocodone: NEGATIVE ng/mL (ref <2.5)
Hydromorphone: NEGATIVE ng/mL (ref <2.5)
MARIJUANA: NEGATIVE ng/mL (ref <2.5)
MDMA: NEGATIVE ng/mL (ref <10)
Meprobamate: NEGATIVE ng/mL (ref <2.5)
Methadone: NEGATIVE ng/mL (ref <5.0)
Morphine: 4.7 ng/mL — ABNORMAL HIGH (ref <2.5)
Nicotine Metabolite: NEGATIVE ng/mL (ref <5.0)
Norhydrocodone: NEGATIVE ng/mL (ref <2.5)
Noroxycodone: 8.2 ng/mL — ABNORMAL HIGH (ref <2.5)
Opiates: POSITIVE ng/mL — AB (ref <2.5)
Oxycodone: 11.2 ng/mL — ABNORMAL HIGH (ref <2.5)
Oxymorphone: NEGATIVE ng/mL (ref <2.5)
Phencyclidine: NEGATIVE ng/mL (ref <10)
Tapentadol: NEGATIVE ng/mL (ref <5.0)
Tramadol: NEGATIVE ng/mL (ref <5.0)
Zolpidem: NEGATIVE ng/mL (ref <5.0)

## 2023-07-21 LAB — DRUG TOX ALC METAB W/CON, ORAL FLD: Alcohol Metabolite: NEGATIVE ng/mL (ref <25)

## 2023-07-22 ENCOUNTER — Emergency Department (HOSPITAL_COMMUNITY)
Admission: EM | Admit: 2023-07-22 | Discharge: 2023-07-23 | Disposition: A | Discharge status: Home / Self Care | Attending: Emergency Medicine | Admitting: Emergency Medicine

## 2023-07-22 ENCOUNTER — Other Ambulatory Visit: Payer: Self-pay

## 2023-07-22 ENCOUNTER — Encounter (HOSPITAL_COMMUNITY): Payer: Self-pay | Admitting: *Deleted

## 2023-07-22 DIAGNOSIS — N390 Urinary tract infection, site not specified: Secondary | ICD-10-CM | POA: Diagnosis not present

## 2023-07-22 DIAGNOSIS — R309 Painful micturition, unspecified: Secondary | ICD-10-CM | POA: Insufficient documentation

## 2023-07-22 DIAGNOSIS — Y732 Prosthetic and other implants, materials and accessory gastroenterology and urology devices associated with adverse incidents: Secondary | ICD-10-CM | POA: Diagnosis not present

## 2023-07-22 DIAGNOSIS — T83098A Other mechanical complication of other indwelling urethral catheter, initial encounter: Secondary | ICD-10-CM | POA: Diagnosis present

## 2023-07-22 LAB — COMPREHENSIVE METABOLIC PANEL WITH GFR
ALT: 27 U/L (ref 0–44)
AST: 27 U/L (ref 15–41)
Albumin: 2.7 g/dL — ABNORMAL LOW (ref 3.5–5.0)
Alkaline Phosphatase: 93 U/L (ref 38–126)
Anion gap: 11 (ref 5–15)
BUN: 13 mg/dL (ref 8–23)
CO2: 24 mmol/L (ref 22–32)
Calcium: 9.2 mg/dL (ref 8.9–10.3)
Chloride: 102 mmol/L (ref 98–111)
Creatinine, Ser: 0.55 mg/dL (ref 0.44–1.00)
GFR, Estimated: >60 mL/min (ref >60)
Glucose, Bld: 117 mg/dL — ABNORMAL HIGH (ref 70–99)
Potassium: 4 mmol/L (ref 3.5–5.1)
Sodium: 137 mmol/L (ref 135–145)
Total Bilirubin: 0.5 mg/dL (ref 0.0–1.2)
Total Protein: 6.4 g/dL — ABNORMAL LOW (ref 6.5–8.1)

## 2023-07-22 LAB — CBC
HCT: 31.7 % — ABNORMAL LOW (ref 35.0–45.0)
HCT: 32.1 % — ABNORMAL LOW (ref 36.0–46.0)
Hemoglobin: 9.9 g/dL — ABNORMAL LOW (ref 11.7–15.5)
Hemoglobin: 10 g/dL — ABNORMAL LOW (ref 12.0–15.0)
MCH: 29.4 pg (ref 27.0–33.0)
MCH: 29.9 pg (ref 26.0–34.0)
MCHC: 31.2 g/dL — ABNORMAL LOW (ref 32.0–36.0)
MCHC: 31.2 g/dL (ref 30.0–36.0)
MCV: 94.1 fL (ref 80.0–100.0)
MCV: 95.8 fL (ref 80.0–100.0)
MPV: 11 fL (ref 7.5–12.5)
Platelets: 279 Thousand/uL (ref 140–400)
Platelets: 258 K/uL (ref 150–400)
RBC: 3.37 Million/uL — ABNORMAL LOW (ref 3.80–5.10)
RBC: 3.35 MIL/uL — ABNORMAL LOW (ref 3.87–5.11)
RDW: 16.8 % — ABNORMAL HIGH (ref 11.0–15.0)
RDW: 19.1 % — ABNORMAL HIGH (ref 11.5–15.5)
WBC: 7.2 Thousand/uL (ref 3.8–10.8)
WBC: 8 K/uL (ref 4.0–10.5)
nRBC: 0 % (ref 0.0–0.2)

## 2023-07-22 LAB — C-REACTIVE PROTEIN: CRP: 12.4 mg/L — ABNORMAL HIGH (ref <8.0)

## 2023-07-22 NOTE — ED Triage Notes (Signed)
 The pt had her entire rt leg amputated up to the groin area on July 10th  she still has a wound vac a foley catheter was placed  because when the pt used the bedpan  the wound was getting  wet.  The catheter has been leaking since yesterday and they did not receive a urologist referral until Friday  afternoon she is also c/o painful urination

## 2023-07-22 NOTE — ED Provider Notes (Signed)
 [place] EMERGENCY DEPARTMENT AT [facility] Provider Note   CSN: [id number] Arrival date & time: 07/22/23  2129     History Chief Complaint  Patient presents with   painful urination   foley leaking    HPI Katherine Archer is a 67 y.o. female presenting for chief complaint of leaking Foley.  As well as some burning/discomfort at the site.  She is a 68 year old female with a complex medical history, recent surgical removal of her right hip and placement of Foley catheter.  Wound VAC is still in site.  Family has been having difficulty with taking care of her because of leakage from the Foley.   Patient's recorded medical, surgical, social, medication list and allergies were reviewed in the Snapshot window as part of the initial history.   Review of Systems   Review of Systems  Constitutional:  Negative for chills and fever.  HENT:  Negative for ear pain and sore throat.   Eyes:  Negative for pain and visual disturbance.  Respiratory:  Negative for cough and shortness of breath.   Cardiovascular:  Negative for chest pain and palpitations.  Gastrointestinal:  Negative for abdominal pain and vomiting.  Genitourinary:  Negative for dysuria and hematuria.  Musculoskeletal:  Negative for arthralgias and back pain.  Skin:  Negative for color change and rash.  Neurological:  Negative for seizures and syncope.  All other systems reviewed and are negative.   Physical Exam Updated Vital Signs BP (!) 151/55 (BP Location: Left Arm)   Pulse 69   Temp 98.1 F (36.7 C)   Resp (!) 30   Ht 5' 6 (1.676 m)   Wt 54 kg   SpO2 100%   BMI 19.21 kg/m  Physical Exam Vitals and nursing note reviewed.  Constitutional:      General: She is not in acute distress.    Appearance: She is well-developed.  HENT:     Head: Normocephalic and atraumatic.  Eyes:     Conjunctiva/sclera: Conjunctivae normal.  Cardiovascular:     Rate and Rhythm: Normal rate and regular rhythm.     Heart  sounds: No murmur heard. Pulmonary:     Effort: Pulmonary effort is normal. No respiratory distress.     Breath sounds: Normal breath sounds.  Abdominal:     General: There is no distension.     Palpations: Abdomen is soft.     Tenderness: There is no abdominal tenderness. There is no right CVA tenderness or left CVA tenderness.  Musculoskeletal:        General: No swelling or tenderness. Normal range of motion.     Cervical back: Neck supple.  Skin:    General: Skin is warm and dry.  Neurological:     General: No focal deficit present.     Mental Status: She is alert and oriented to person, place, and time. Mental status is at baseline.     Cranial Nerves: No cranial nerve deficit.      ED Course/ Medical Decision Making/ A&P    Procedures Procedures   Medications Ordered in ED Medications - No data to display  Medical Decision Making:    68 year old female presenting with complication from Foley catheter in the setting of chronic wound near her vaginal area. No signs of skin irritation or breakdown at this time.  Wound VAC is clean dry intact. Catheter replaced with no leakage at this time.  Does appear to have a UTI with sediment in the initial sample.  Will treat patient with Keflex  3 times daily follow-up with urology as scheduled.  No acute distress at this time stable for outpatient care management. Disposition:  I have considered need for hospitalization, however, considering all of the above, I believe this patient is stable for discharge at this time.  Patient/family educated about specific return precautions for given chief complaint and symptoms.  Patient/family educated about follow-up with PCP.    Patient/family expressed understanding of return precautions and need for follow-up. Patient spoken to regarding all imaging and laboratory results and appropriate follow up for these results. All education provided in verbal form with additional information in written  form. Time was allowed for answering of patient questions. Patient discharged.    Emergency Department Medication Summary:   Medications  cephALEXin  (KEFLEX ) capsule 500 mg (500 mg Oral Given 07/23/23 0146)         Clinical Impression: No diagnosis found.   Data Unavailable   Final Clinical Impression(s) / ED Diagnoses Final diagnoses:  None    Rx / DC Orders ED Discharge Orders     None         Jerral Meth, MD 07/23/23 [phone]

## 2023-07-23 ENCOUNTER — Ambulatory Visit: Payer: Self-pay | Admitting: Infectious Diseases

## 2023-07-23 DIAGNOSIS — R309 Painful micturition, unspecified: Secondary | ICD-10-CM | POA: Diagnosis not present

## 2023-07-23 LAB — URINALYSIS, ROUTINE W REFLEX MICROSCOPIC
Bilirubin Urine: NEGATIVE
Glucose, UA: NEGATIVE mg/dL
Ketones, ur: NEGATIVE mg/dL
Nitrite: NEGATIVE
Protein, ur: NEGATIVE mg/dL
Specific Gravity, Urine: 1.004 — ABNORMAL LOW (ref 1.005–1.030)
pH: 6 (ref 5.0–8.0)

## 2023-07-23 MED ORDER — CEPHALEXIN 250 MG PO CAPS
500.0000 mg | ORAL_CAPSULE | Freq: Once | ORAL | Status: AC
  Administered 2023-07-23: 500 mg via ORAL
  Filled 2023-07-23: qty 2

## 2023-07-23 MED ORDER — CEPHALEXIN 500 MG PO CAPS: 500.0000 mg | ORAL_CAPSULE | Freq: Three times a day (TID) | ORAL | 0 refills | Status: AC

## 2023-07-24 NOTE — Telephone Encounter (Signed)
-----   Message from Annalee Joseph sent at 07/24/2023  7:59 AM EDT ----- Please let patient/family know labs stable.  ----- Message ----- From: Rebecka Hose Lab Results In Sent: 07/20/2023  10:43 PM EDT To: Annalee Orem, MD

## 2023-07-24 NOTE — Telephone Encounter (Signed)
 Called patient's granddaughter [name] regarding results. She did not have any questions during call.  Lorenda CHRISTELLA Code, RMA

## 2023-07-25 LAB — URINE CULTURE: Culture: 40000 — AB

## 2023-07-26 ENCOUNTER — Telehealth (HOSPITAL_BASED_OUTPATIENT_CLINIC_OR_DEPARTMENT_OTHER): Payer: Self-pay

## 2023-07-26 DIAGNOSIS — T8131XA Disruption of external operation (surgical) wound, not elsewhere classified, initial encounter: Secondary | ICD-10-CM | POA: Diagnosis not present

## 2023-07-26 DIAGNOSIS — S21209A Unspecified open wound of unspecified back wall of thorax without penetration into thoracic cavity, initial encounter: Secondary | ICD-10-CM | POA: Diagnosis not present

## 2023-07-26 DIAGNOSIS — S71001A Unspecified open wound, right hip, initial encounter: Secondary | ICD-10-CM | POA: Diagnosis not present

## 2023-07-26 NOTE — Telephone Encounter (Signed)
 Post ED Visit - Positive Culture Follow-up  Culture report reviewed by antimicrobial stewardship pharmacist: [facility] [x]  [place], [place].D. []  Venetia Gully, Pharm.D., BCPS AQ-ID []  Garrel Crews, Pharm.D., BCPS []  Almarie Lunger, [address].D., BCPS []  [place], [place].D., BCPS, AAHIVP []  Rosaline Bihari, Pharm.D., BCPS, AAHIVP []  Vernell Meier, PharmD, BCPS []  Latanya Hint, PharmD, BCPS []  Donald Medley, PharmD, BCPS []  Rocky Bold, PharmD []  Dorothyann Alert, PharmD, BCPS []  Morene Babe, PharmD  [facility] []  Rosaline Edison, PharmD []  Romona Bliss, PharmD []  Dolphus Roller, PharmD []  Veva Seip, Rph []  Vernell Daunt) Leonce, PharmD []  Eva Allis, PharmD []  Rosaline Millet, PharmD []  Iantha Batch, PharmD []  Arvin Gauss, PharmD []  Wanda Hasting, PharmD []  Ronal Rav, PharmD []  Rocky Slade, PharmD []  Bard Jeans, PharmD   Positive urine culture Treated with Cephalexin , organism sensitive to the same and no further patient follow-up is required at this time.  Ruth Camelia Elbe 07/26/2023, 12:29 PM

## 2023-07-27 DIAGNOSIS — R339 Retention of urine, unspecified: Secondary | ICD-10-CM | POA: Diagnosis not present

## 2023-07-27 DIAGNOSIS — Z79899 Other long term (current) drug therapy: Secondary | ICD-10-CM | POA: Diagnosis not present

## 2023-07-27 DIAGNOSIS — Z978 Presence of other specified devices: Secondary | ICD-10-CM | POA: Diagnosis not present

## 2023-08-01 ENCOUNTER — Ambulatory Visit: Admitting: Infectious Diseases

## 2023-08-01 ENCOUNTER — Other Ambulatory Visit: Payer: Self-pay

## 2023-08-01 ENCOUNTER — Other Ambulatory Visit (HOSPITAL_COMMUNITY): Payer: Self-pay

## 2023-08-01 DIAGNOSIS — N3090 Cystitis, unspecified without hematuria: Secondary | ICD-10-CM

## 2023-08-01 DIAGNOSIS — A4902 Methicillin resistant Staphylococcus aureus infection, unspecified site: Secondary | ICD-10-CM

## 2023-08-01 DIAGNOSIS — A491 Streptococcal infection, unspecified site: Secondary | ICD-10-CM

## 2023-08-01 DIAGNOSIS — T83518A Infection and inflammatory reaction due to other urinary catheter, initial encounter: Secondary | ICD-10-CM | POA: Diagnosis not present

## 2023-08-01 DIAGNOSIS — T8130XA Disruption of wound, unspecified, initial encounter: Secondary | ICD-10-CM | POA: Diagnosis not present

## 2023-08-01 DIAGNOSIS — Z79899 Other long term (current) drug therapy: Secondary | ICD-10-CM | POA: Diagnosis not present

## 2023-08-01 HISTORY — DX: Cystitis, unspecified without hematuria: N30.90

## 2023-08-01 LAB — CBC
HCT: 33.9 % — ABNORMAL LOW (ref 35.0–45.0)
Hemoglobin: 10.7 g/dL — ABNORMAL LOW (ref 11.7–15.5)
MCH: 30.8 pg (ref 27.0–33.0)
MCHC: 31.6 g/dL — ABNORMAL LOW (ref 32.0–36.0)
MCV: 97.7 fL (ref 80.0–100.0)
MPV: 11.9 fL (ref 7.5–12.5)
Platelets: 231 Thousand/uL (ref 140–400)
RBC: 3.47 Million/uL — ABNORMAL LOW (ref 3.80–5.10)
RDW: 20.6 % — ABNORMAL HIGH (ref 11.0–15.0)
WBC: 5.7 Thousand/uL (ref 3.8–10.8)

## 2023-08-01 MED ORDER — DOXYCYCLINE HYCLATE 100 MG PO TABS: 100.0000 mg | ORAL_TABLET | Freq: Two times a day (BID) | ORAL | 0 refills | Status: DC

## 2023-08-01 MED ORDER — CEFADROXIL 500 MG PO CAPS: 1000.0000 mg | ORAL_CAPSULE | Freq: Two times a day (BID) | ORAL | 0 refills | Status: DC

## 2023-08-01 NOTE — Progress Notes (Signed)
 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   Prolapse of female pelvic organs 06/29/2023   Dehiscence of amputation stump of right lower extremity (HCC) 06/24/2023   Atherosclerosis of native arteries of extremities with gangrene, right leg (HCC) 06/24/2023   Surgical site infection 06/16/2023   Protein-calorie malnutrition, severe 06/10/2023   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   Elevated CK 06/06/2023   Normocytic anemia 06/04/2023   Vascular graft infection (HCC) 06/01/2023   Septic shock due to group A strep bacteremia and MRSA graft infection due to postsurgical femoropopliteal bypass graft infection (HCC) 05/29/2023   Thrombocytopenia (HCC) 05/12/2023   Statin myopathy 06/15/2022   Critical limb ischemia of right lower extremity (HCC) 01/26/2022   Critical lower limb ischemia (HCC)    Coronary artery disease involving native coronary artery of native heart without angina pectoris 10/03/2019   Essential hypertension 10/03/2019   Mixed hyperlipidemia 10/03/2019   Hyperlipidemia    Hypertension    Stroke (HCC) 08/23/2019   PAD (peripheral artery disease) (HCC) 08/23/2019   Cervical polyp 09/18/2018   Encounter for screening mammogram for breast cancer 05/05/2016   Fibrocystic breast changes of both breasts 04/29/2015   Current Outpatient Medications on File Prior to Visit  Medication Sig Dispense Refill   acetaminophen  (TYLENOL ) 325 MG tablet Take 1 tablet (325 mg total) by mouth every 4 (four) hours as needed for mild pain (pain score 1-3). 200 tablet 0   amLODipine  (NORVASC ) 5 MG tablet Take 1 tablet (5 mg total) by mouth daily. 30 tablet 0   ascorbic  acid (VITAMIN C ) 500 MG tablet Take 0.5 tablets (250 mg total) by mouth 2 (two) times daily. 30 tablet 0   aspirin  EC 81 MG tablet Take 1 tablet (81 mg total) by mouth daily. Swallow whole. 30 tablet 12   ezetimibe  (ZETIA ) 10 MG tablet Take 1 tablet (10 mg total) by mouth daily. 30 tablet 0   Fe Fum-Vit C-Vit B12-FA (TRIGELS-F FORTE) CAPS capsule Take 1 capsule by mouth daily with lunch. 30 capsule 0   feeding supplement (ENSURE PLUS HIGH PROTEIN) LIQD Take 237 mLs by mouth 2 (two) times daily between meals. 10000 mL 0   gabapentin  (NEURONTIN ) 400 MG capsule Take 1 capsule (400 mg total) by mouth 3 (three) times daily. 90 capsule 0   hydrALAZINE  (APRESOLINE ) 25 MG tablet Take 1 tablet (25 mg total) by mouth every 8 (eight) hours. 90 tablet 0   linezolid  (ZYVOX ) 600 MG tablet Take 1 tablet (600 mg total) by mouth every 12 (twelve) hours. 42 tablet 0   melatonin 5 MG TABS Take 1 tablet (5 mg total) by mouth at bedtime as needed. 30 tablet 0   metoprolol  succinate (TOPROL -XL) 25 MG 24 hr tablet Take 0.5 tablets (12.5 mg total) by mouth daily. 15 tablet 0   morphine  (MS CONTIN ) 15 MG 12 hr tablet Take 1 tablet (15 mg total) by mouth every 12 (twelve) hours. 60 tablet 0   Multiple Vitamin (MULTIVITAMIN WITH MINERALS) TABS tablet Take 1 tablet by mouth daily. 30 tablet 0   Oxycodone  HCl 10 MG TABS Take 1 tablet (10 mg total) by mouth 2 (two) times daily as needed (break through pain).  60 tablet 0   oxymetazoline  (AFRIN) 0.05 % nasal spray Place 1 spray into both nostrils 2 (two) times daily as needed (nosebleeds). 30 mL 0   pantoprazole  (PROTONIX ) 40 MG tablet Take 1 tablet (40 mg total) by mouth daily. 30 tablet 0   polyethylene glycol powder (GLYCOLAX /MIRALAX ) 17 GM/SCOOP powder Take 17 g by mouth daily. 238 g 0   senna-docusate (SENOKOT-S) 8.6-50 MG tablet Take 2 tablets by mouth daily with supper. 60 tablet 0   No current facility-administered medications on file prior to visit.    Subjective: Discussed the use of AI scribe software for clinical note transcription with the patient, who gave verbal consent to proceed.   68 Y O Female with prior h/o HTN, CVA, HLD, PAD w critical limb ischemia s/p vascular interventions complicated with Left groin AVG infection ( Group A Strep and MRSA) s/p multiple vascular interventions including AKA with eventual removal of complete removal of rt axillary fem graft, rt to left fem fem bypass graft ( confirmed with Vascular) + placement of  left fem to common femoral artery bypass graft, finally leading to Rt hip disarticulation for source control ( no concerns for remaining infection per Dr Harden). Seen by ID and recommended 6 weeks of antibiotics, initially on daptomycin  then transitioned to PO linezolid  during the later half of course through 7/9. She was discharged to rehab on 7/1. She was noted to have wound drainage during rehab admission, evaluated by Dr Harden agreste of wound vac and Doxycycline  added for a week. Patient was discharged home rehab with 3 weeks of PO linezolid  per d/w Dr Harden.    7/17 Accompanied by grand daughter. Spoke with interpreter. Completed doxycycline  on July 11th, taking PO linezolid  now. Seen by Ortho on 7/16, Wound looked approximated on 7/16 but seems to have dehisced today. Very minimal drainage. Denies fevers, chills. Denies nausea, vomiting or diarrhea.  Grandddaughter calling Dr Crist office for wound dehiscence. Told to to go to ED if any signs of infection   7/29 Accompanied by granddaughter. Spoke with in person interpreter. Reports being complaint to linezolid  and have pills to last till next Tuesday, compliant. They had contacted orthopedics after last appt who reviewed the wound and were not too concerned and have an appt tomorrow. She was seen in the ED on 7/17 for leaking foley/s and painful urination, catheter was replaced and discharged on cephalexin  to treat for UTI with Urology fu. Minimal  drainage from the rt lower abdomen wound and decreasing. Denies fevers, chills, nausea, vomiting, malaise. No complaints.   Review of Systems: all systems reviewed and negative   Past Medical History:  Diagnosis Date   Cervical polyp 09/18/2018   Critical lower limb ischemia (HCC)    Encounter for screening mammogram for breast cancer 05/05/2016   Fibrocystic breast changes of both breasts 04/29/2015   Hyperlipidemia    Hypertension    PAD (peripheral artery disease) (HCC) 08/23/2019   Pelvic prolapse 06/2020   Pelvic ring in place, needs to be changed every 3 months   Stroke (HCC) 08/23/2019   Past Surgical History:  Procedure Laterality Date   ABDOMINAL AORTOGRAM W/LOWER EXTREMITY Bilateral 08/23/2019   Procedure: ABDOMINAL AORTOGRAM W/ Bilateral LOWER EXTREMITY [place];  Surgeon: Harvey Carlin BRAVO, MD;  Location: [facility];  Service: Cardiovascular;  Laterality: Bilateral;   AMPUTATION Right 05/31/2023   Procedure: AMPUTATION, ABOVE KNEE SARTORIOUS MUSCLE FLAP;  Surgeon: Serene Gaile ORN, MD;  Location: [facility] OR;  Service: Vascular;  Laterality:  Right;   APPLICATION OF WOUND VAC Right 05/31/2023   Procedure: APPLICATION, WOUND VAC;  Surgeon: Serene Gaile ORN, MD;  Location: [facility];  Service: Vascular;  Laterality: Right;   APPLICATION OF WOUND VAC Right 06/16/2023   Procedure: APPLICATION, WOUND VAC;  Surgeon: Serene Gaile ORN, MD;  Location: [facility];  Service: Vascular;  Laterality: Right;   APPLICATION OF WOUND VAC Right 06/21/2023   Procedure: APPLICATION, WOUND VAC;  Surgeon: Serene Gaile ORN, MD;  Location: [facility];  Service: Vascular;  Laterality: Right;   APPLICATION, SKIN SUBSTITUTE Right 05/12/2023   Procedure: APPLICATION, SKIN SUBSTITUTE KERECIS 38SQ CM;  Surgeon: Serene Gaile ORN, MD;  Location: [facility];  Service: Vascular;  Laterality: Right;   APPLICATION, SKIN SUBSTITUTE Right 06/16/2023   Procedure: APPLICATION, SKIN SUBSTITUTE;  Surgeon: Serene Gaile ORN, MD;  Location: [facility];  Service: Vascular;  Laterality: Right;   APPLICATION, SKIN SUBSTITUTE Right 06/21/2023   Procedure: APPLICATION, SKIN SUBSTITUTE;  Surgeon: Serene Gaile ORN, MD;  Location: [facility];  Service: Vascular;  Laterality: Right;   AVGG REMOVAL Right 05/29/2023   Procedure: REMOVAL BYPASS GRAFT RIGHT LEG;  Surgeon: Lanis Fonda BRAVO, MD;  Location: [facility] OR;  Service: Vascular;  Laterality: Right;   AXILLARY-FEMORAL BYPASS GRAFT Right 11/11/2019   Procedure: BYPASS GRAFT RIGHT AXILLA-BIFEMORAL;  Surgeon: Harvey Carlin BRAVO, MD;  Location: [facility];  Service: Vascular;  Laterality: Right;   AXILLARY-FEMORAL BYPASS GRAFT Left 06/21/2023   Procedure: CREATION, BYPASS, ARTERIAL, LEFT AXILLARY TO LEFT FEMORAL, USING GRAFT;  Surgeon: Serene Gaile ORN, MD;  Location: [facility];  Service: Vascular;  Laterality: Left;   BRAIN SURGERY     BREAST SURGERY Bilateral 2014   1980 R breast surgery   CEREBRAL ANEURYSM REPAIR     ENDARTERECTOMY FEMORAL Bilateral 11/11/2019   Procedure: ENDARTERECTOMY FEMORAL BILATERALLY;  Surgeon: Harvey Carlin BRAVO, MD;  Location: [facility] OR;  Service: Vascular;  Laterality: Bilateral;   ENDARTERECTOMY FEMORAL Right 01/26/2022   Procedure: REDO RIGHT COMMON FEMORAL ENDARTERECTOMY USING 0.8CM X 7.6CM HEMASHIELD PLATINUM FINESSE PATCH;  Surgeon: Serene Gaile ORN, MD;  Location: [facility];  Service: Vascular;  Laterality: Right;   FEMORAL-POPLITEAL BYPASS GRAFT Right 01/26/2022   Procedure: RIGHT FEMORAL-POPLITEAL BYPASS GRAFT USING GORE X 80CM PROPATEN GRAFT;  Surgeon: Serene Gaile ORN, MD;  Location: [facility];  Service: Vascular;  Laterality: Right;   FEMORAL-POPLITEAL BYPASS GRAFT Right 05/12/2023   Procedure: BYPASS GRAFT FEMORAL-POPLITEAL ARTERY;  Surgeon: Serene Gaile ORN, MD;  Location: [facility] OR;  Service: Vascular;  Laterality: Right;   HIP DISARTICULATION Right 06/28/2023   Procedure: MARGA, HIP;  Surgeon: Harden Jerona GAILS, MD;  Location: [facility] OR;  Service: Orthopedics;  Laterality: Right;   INCISION  AND DRAINAGE OF WOUND Right 05/31/2023   Procedure: IRRIGATION AND DEBRIDEMENT WOUND;  Surgeon: Serene Gaile ORN, MD;  Location: [facility];  Service: Vascular;  Laterality: Right;  RIGHT GROIN WASHOUT   INCISION AND DRAINAGE OF WOUND Right 06/16/2023   Procedure: IRRIGATION AND DEBRIDEMENT WOUND;  Surgeon: Serene Gaile ORN, MD;  Location: [facility];  Service: Vascular;  Laterality: Right;  Right Groin   INCISION AND DRAINAGE OF WOUND Right 06/21/2023   Procedure: IRRIGATION AND DEBRIDEMENT OF RIGHT GROIN AND RIGHT ABOVE KNEE AMPUTATION;  Surgeon: Serene Gaile ORN, MD;  Location: [facility];  Service: Vascular;  Laterality: Right;   IR RADIOLOGIST EVAL & MGMT  04/02/2020   IR RADIOLOGIST EVAL & MGMT  03/29/2021   REMOVAL OF GRAFT Right 06/21/2023  Procedure: REMOVAL, GRAFT, RIGHT AXILLARY BIFEMORAL GRAFT;  Surgeon: Serene Gaile ORN, MD;  [facility]: [facility];  Service: Vascular;  Laterality: Right;   REVISION AMPUTATION, BELOW THE KNEE Right 06/16/2023   Procedure: REVISION AMPUTATION, ABOVE THE KNEE;  Surgeon: Serene Gaile ORN, MD;  [facility]: [facility];  Service: Vascular;  Laterality: Right;   THROMBECTOMY FEMORAL ARTERY Right 01/26/2022   Procedure: THROMBECTOMY RIGHT FEMORAL ARTERY;  Surgeon: Serene Gaile ORN, MD;  Location: [facility];  Service: Vascular;  Laterality: Right;   THROMBECTOMY OF BYPASS GRAFT FEMORAL- TIBIAL ARTERY Right 05/12/2023   Procedure: THROMBECTOMY, BYPASS GRAFT, ARTERIAL;  Surgeon: Serene Gaile ORN, MD;  Location: [facility];  Service: Vascular;  Laterality: Right;   TUBAL LIGATION  1990    Social History   Tobacco Use   Smoking status: Former    Current packs/day: 0.00    Types: Cigarettes    Quit date: 10/04/2019    Years since quitting: 3.8    Passive exposure: Never   Smokeless tobacco: Never  Vaping Use   Vaping status: Never Used  Substance Use Topics   Alcohol  use: No   Drug use: No    Family History  Problem Relation Age of Onset   Heart Problems Father    Heart Problems Brother     Heart Problems Brother     Allergies  Allergen Reactions   Daptomycin      Elevated CK   Penicillins Other (See Comments)    Shaky and Dizziness 20 years ago    Health Maintenance  Topic Date Due   Medicare Annual Wellness (AWV)  Never done   COVID-19 Vaccine (1) Never done   Hepatitis C Screening  Never done   DTaP/Tdap/Td (1 - Tdap) Never done   Pneumococcal Vaccine: 50+ Years (1 of 2 - PCV) Never done   Zoster Vaccines- Shingrix (1 of 2) Never done   Colonoscopy  Never done   MAMMOGRAM  Never done   DEXA SCAN  Never done   INFLUENZA VACCINE  08/04/2023   Hepatitis B Vaccines  Aged Out   HPV VACCINES  Aged Out   Meningococcal B Vaccine  Aged Out    Objective: There were no vitals taken for this visit.  Physical Exam Constitutional:      Appearance: Normal appearance.  HENT:     Head: Normocephalic and atraumatic.      Mouth: Mucous membranes are moist.  Eyes:    Conjunctiva/sclera: Conjunctivae normal.     Pupils: Pupils are equal, round, and b/l symmetrical    Cardiovascular:     Rate and Rhythm: Normal rate and regular rhythm.     Heart sounds:   Pulmonary:     Effort: Pulmonary effort is normal.     Breath sounds:   Abdominal:     General: Non distended     Palpations:  Musculoskeletal:        General: sitting in the wheelchair  Rt lower abdominal wound, dehisced at some areas, but no purulence, no surrounding cellulitis or fluctuance    Skin:    General: Skin is warm and dry.     Comments:  Neurological:     General: grossly non focal     Mental Status: awake, alert and oriented to person, place, and time.   Psychiatric:        Mood and Affect: Mood normal.   Lab Results Lab Results  Component Value Date   WBC 8.0 07/22/2023   HGB 10.0 (L) 07/22/2023  HCT 32.1 (L) 07/22/2023   MCV 95.8 07/22/2023   PLT 258 07/22/2023    Lab Results  Component Value Date   CREATININE 0.55 07/22/2023   BUN 13 07/22/2023   NA 137 07/22/2023   K  4.0 07/22/2023   CL 102 07/22/2023   CO2 24 07/22/2023    Lab Results  Component Value Date   ALT 27 07/22/2023   AST 27 07/22/2023   ALKPHOS 93 07/22/2023   BILITOT 0.5 07/22/2023    Lab Results  Component Value Date   CHOL 133 01/27/2022   HDL 36 (L) 01/27/2022   LDLCALC 85 01/27/2022   TRIG 59 01/27/2022   CHOLHDL 3.7 01/27/2022   No results found for: LABRPR, RPRTITER No results found for: HIV1RNAQUANT, HIV1RNAVL, CD4TABS   Assessment/Plan # Recent MRSA and Group A Rt LE AVG infection s/p rt hip disarticulation complicated  # RT lower abdominal wound dehiscence  # PAD - IV daptomycin  through 6/27> PO linezolid  till date (was briefly on PO doxycyline completed on 7/11)  Plan  - continue PO Linezolid  as through completion till next Tuesday then start doxycycline  + amoxicillin for 2 more weeks - 7/19 CBC and BMP reviewed, CBC today  - fu in 3 weeks  - fu with Orthopedics as instructed  # E coli UTI  - s/p Foley's exchange and 5 days course of cephalexin   - improved    I spent 25 minutes involved in face-to-face and non-face-to-face activities for this patient on the day of the visit. Professional time spent includes the following activities: Preparing to see the patient (review of tests), Obtaining and  reviewing separately obtained history (ED notes 7/19), Performing a medically appropriate examination and evaluation, Ordering labs/medications, using interpreter, Documenting clinical information in the EMR, Independently interpreting results (not separately reported), Communicating results to the patient/family member, Counseling and educating the patient/member and Care coordination (not separately reported).   Of note, portions of this note may have been created with voice recognition software. While this note has been edited for accuracy, occasional wrong-word or 'sound-a-like' substitutions may have occurred due to the inherent limitations of voice recognition  software.   Annalee Joseph, MD [facility] 08/01/2023, 11:00 AM

## 2023-08-02 ENCOUNTER — Encounter: Payer: Self-pay | Admitting: Family

## 2023-08-02 ENCOUNTER — Ambulatory Visit (INDEPENDENT_AMBULATORY_CARE_PROVIDER_SITE_OTHER): Admitting: Family

## 2023-08-02 DIAGNOSIS — Z960 Presence of urogenital implants: Secondary | ICD-10-CM

## 2023-08-02 DIAGNOSIS — I70221 Atherosclerosis of native arteries of extremities with rest pain, right leg: Secondary | ICD-10-CM

## 2023-08-02 NOTE — Progress Notes (Signed)
 Post-Op Visit Note   Patient: Katherine Archer           Date of Birth: December 20, 1955           MRN: [id number] Visit Date: 08/02/2023 PCP: [name], MD  Chief Complaint: No chief complaint on file.   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  Plan: Remaining sutures harvested.  Continue with daily Dial soap cleansing.  Dry dressings.  Follow-up in office in 4 weeks sooner should any concerns arise.  Given order for prosthesis set up today to [facility].  Also will reach out to urology from an orthopedic standpoint it is okay to discontinue the Foley catheter.  Follow-Up Instructions: No follow-ups on file.   Imaging: No results found.  Orders:  No orders of the defined types were placed in this encounter.  No orders of the defined types were placed in this encounter.    PMFS History: 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   Prolapse of female pelvic organs 06/29/2023   Dehiscence of amputation stump of right lower extremity (HCC) 06/24/2023   Atherosclerosis of native arteries of extremities with gangrene, right leg (HCC) 06/24/2023   Surgical site infection 06/16/2023   Protein-calorie malnutrition, severe 06/10/2023   Right  above-knee amputee (HCC) 06/07/2023   Elevated CK 06/06/2023   Normocytic anemia 06/04/2023   Vascular graft infection (HCC) 06/01/2023   Septic shock due to group A strep bacteremia and MRSA graft infection due to postsurgical femoropopliteal bypass graft infection (HCC) 05/29/2023   Thrombocytopenia (HCC) 05/12/2023   Statin myopathy 06/15/2022   Critical limb ischemia of right lower extremity (HCC) 01/26/2022   Critical lower limb ischemia (HCC)    Coronary artery disease involving native coronary artery of native heart without angina pectoris 10/03/2019   Essential hypertension 10/03/2019   Mixed hyperlipidemia 10/03/2019   Hyperlipidemia    Hypertension    Stroke (HCC) 08/23/2019   PAD (peripheral artery disease) (HCC) 08/23/2019   Cervical polyp 09/18/2018   Encounter for screening mammogram for breast cancer 05/05/2016   Fibrocystic breast changes of both breasts 04/29/2015   Past Medical History:  Diagnosis Date   Cervical polyp 09/18/2018   Critical lower limb ischemia (HCC)    Encounter for screening mammogram for breast cancer 05/05/2016   Fibrocystic breast changes of both breasts 04/29/2015   Hyperlipidemia    Hypertension    PAD (peripheral artery disease) (HCC) 08/23/2019   Pelvic prolapse 06/2020   Pelvic ring in place, needs to be changed every 3 months   Stroke [facility]) 08/23/2019    Family History  Problem Relation Age of Onset   Heart Problems Father    Heart Problems Brother    Heart Problems Brother     Past Surgical  History:  Procedure Laterality Date   ABDOMINAL AORTOGRAM W/LOWER EXTREMITY Bilateral 08/23/2019   Procedure: ABDOMINAL AORTOGRAM W/ Bilateral LOWER EXTREMITY Run[place];  Surgeon: Harvey Carlin BRAVO, MD;  Location: [facility];  Service: Cardiovascular;  Laterality: Bilateral;   AMPUTATION Right 05/31/2023   Procedure: AMPUTATION, ABOVE KNEE SARTORIOUS MUSCLE FLAP;  Surgeon: Serene Gaile ORN, MD;  Location: [facility] OR;  Service: Vascular;   Laterality: Right;   APPLICATION OF WOUND VAC Right 05/31/2023   Procedure: APPLICATION, WOUND VAC;  Surgeon: Serene Gaile ORN, MD;  Location: [facility];  Service: Vascular;  Laterality: Right;   APPLICATION OF WOUND VAC Right 06/16/2023   Procedure: APPLICATION, WOUND VAC;  Surgeon: Serene Gaile ORN, MD;  Location: [facility];  Service: Vascular;  Laterality: Right;   APPLICATION OF WOUND VAC Right 06/21/2023   Procedure: APPLICATION, WOUND VAC;  Surgeon: Serene Gaile ORN, MD;  Location: [facility];  Service: Vascular;  Laterality: Right;   APPLICATION, SKIN SUBSTITUTE Right 05/12/2023   Procedure: APPLICATION, SKIN SUBSTITUTE KERECIS 38SQ CM;  Surgeon: Serene Gaile ORN, MD;  Location: [facility];  Service: Vascular;  Laterality: Right;   APPLICATION, SKIN SUBSTITUTE Right 06/16/2023   Procedure: APPLICATION, SKIN SUBSTITUTE;  Surgeon: Serene Gaile ORN, MD;  Location: [facility];  Service: Vascular;  Laterality: Right;   APPLICATION, SKIN SUBSTITUTE Right 06/21/2023   Procedure: APPLICATION, SKIN SUBSTITUTE;  Surgeon: Serene Gaile ORN, MD;  Location: [facility];  Service: Vascular;  Laterality: Right;   AVGG REMOVAL Right 05/29/2023   Procedure: REMOVAL BYPASS GRAFT RIGHT LEG;  Surgeon: Lanis Fonda BRAVO, MD;  Location: [facility] OR;  Service: Vascular;  Laterality: Right;   AXILLARY-FEMORAL BYPASS GRAFT Right 11/11/2019   Procedure: BYPASS GRAFT RIGHT AXILLA-BIFEMORAL;  Surgeon: Harvey Carlin BRAVO, MD;  Location: [facility];  Service: Vascular;  Laterality: Right;   AXILLARY-FEMORAL BYPASS GRAFT Left 06/21/2023   Procedure: CREATION, BYPASS, ARTERIAL, LEFT AXILLARY TO LEFT FEMORAL, USING GRAFT;  Surgeon: Serene Gaile ORN, MD;  Location: [facility];  Service: Vascular;  Laterality: Left;   BRAIN SURGERY     BREAST SURGERY Bilateral 2014   1980 R breast surgery   CEREBRAL ANEURYSM REPAIR     ENDARTERECTOMY FEMORAL Bilateral 11/11/2019   Procedure: ENDARTERECTOMY FEMORAL BILATERALLY;  Surgeon: Harvey Carlin BRAVO, MD;  Location[facility] OR;  Service:  Vascular;  Laterality: Bilateral;   ENDARTERECTOMY FEMORAL Right 01/26/2022   Procedure: REDO RIGHT COMMON FEMORAL ENDARTERECTOMY USING 0.8CM X 7.6CM HEMASHIELD PLATINUM FINESSE PATCH;  Surgeon: Serene Gaile ORN, MD;  Location: [facility];  Service: Vascular;  Laterality: Right;   FEMORAL-POPLITEAL BYPASS GRAFT Right 01/26/2022   Procedure: RIGHT FEMORAL-POPLITEAL BYPASS GRAFT USING GORE X 80CM PROPATEN GRAFT;  Surgeon: Serene Gaile ORN, MD;  Location: [facility];  Service: Vascular;  Laterality: Right;   FEMORAL-POPLITEAL BYPASS GRAFT Right 05/12/2023   Procedure: BYPASS GRAFT FEMORAL-POPLITEAL ARTERY;  Surgeon: Serene Gaile ORN, MD;  Location: [facility] OR;  Service: Vascular;  Laterality: Right;   HIP DISARTICULATION Right 06/28/2023   Procedure: MARGA, HIP;  Surgeon: Harden Jerona GAILS, MD;  Location: [facility] OR;  Service: Orthopedics;  Laterality: Right;   INCISION AND DRAINAGE OF WOUND Right 05/31/2023   Procedure: IRRIGATION AND DEBRIDEMENT WOUND;  Surgeon: Serene Gaile ORN, MD;  Location: [facility];  Service: Vascular;  Laterality: Right;  RIGHT GROIN WASHOUT   INCISION AND DRAINAGE OF WOUND Right 06/16/2023   Procedure: IRRIGATION AND DEBRIDEMENT WOUND;  Surgeon: Serene Gaile ORN, MD;  Location: [facility];  Service: Vascular;  Laterality: Right;  Right Groin   INCISION AND DRAINAGE OF WOUND Right 06/21/2023   Procedure: IRRIGATION AND DEBRIDEMENT OF RIGHT GROIN AND RIGHT ABOVE KNEE AMPUTATION;  Surgeon: Serene Gaile ORN, MD;  Location: [facility];  Service: Vascular;  Laterality: Right;   IR RADIOLOGIST EVAL & MGMT  04/02/2020   IR RADIOLOGIST EVAL & MGMT  03/29/2021   REMOVAL OF GRAFT Right 06/21/2023   Procedure: REMOVAL, GRAFT, RIGHT AXILLARY BIFEMORAL GRAFT;  Surgeon: Serene Gaile ORN, MD;  Location: [facility];  Service: Vascular;  Laterality: Right;   REVISION AMPUTATION, BELOW THE KNEE Right 06/16/2023   Procedure: REVISION AMPUTATION, ABOVE THE KNEE;  Surgeon: Serene Gaile ORN, MD;  Location: [facility];  Service: Vascular;   Laterality: Right;   THROMBECTOMY FEMORAL ARTERY Right 01/26/2022   Procedure: THROMBECTOMY RIGHT FEMORAL ARTERY;  Surgeon: Serene Gaile ORN, MD;  Location: [facility];  Service: Vascular;  Laterality: Right;   THROMBECTOMY OF BYPASS GRAFT FEMORAL- TIBIAL ARTERY Right 05/12/2023   Procedure: THROMBECTOMY, BYPASS GRAFT, ARTERIAL;  Surgeon: Serene Gaile ORN, MD;  Location: [facility];  Service: Vascular;  Laterality: Right;   TUBAL LIGATION  1990   Social History   Occupational History   Not on file  Tobacco Use   Smoking status: Former    Current packs/day: 0.00    Types: Cigarettes    Quit date: 10/04/2019    Years since quitting: 3.8    Passive exposure: Never   Smokeless tobacco: Never  Vaping Use   Vaping status: Never Used  Substance and Sexual Activity   Alcohol  use: No   Drug use: No   Sexual activity: Not Currently

## 2023-08-03 ENCOUNTER — Ambulatory Visit: Payer: Self-pay | Admitting: Infectious Diseases

## 2023-08-04 DIAGNOSIS — I70221 Atherosclerosis of native arteries of extremities with rest pain, right leg: Secondary | ICD-10-CM | POA: Diagnosis not present

## 2023-08-04 DIAGNOSIS — Z89611 Acquired absence of right leg above knee: Secondary | ICD-10-CM | POA: Diagnosis not present

## 2023-08-04 DIAGNOSIS — A419 Sepsis, unspecified organism: Secondary | ICD-10-CM | POA: Diagnosis not present

## 2023-08-04 DIAGNOSIS — S88112A Complete traumatic amputation at level between knee and ankle, left lower leg, initial encounter: Secondary | ICD-10-CM | POA: Diagnosis not present

## 2023-08-04 DIAGNOSIS — I739 Peripheral vascular disease, unspecified: Secondary | ICD-10-CM | POA: Diagnosis not present

## 2023-08-08 DIAGNOSIS — R339 Retention of urine, unspecified: Secondary | ICD-10-CM | POA: Diagnosis not present

## 2023-08-08 DIAGNOSIS — Z978 Presence of other specified devices: Secondary | ICD-10-CM | POA: Diagnosis not present

## 2023-08-10 ENCOUNTER — Other Ambulatory Visit (HOSPITAL_COMMUNITY): Payer: Self-pay

## 2023-08-16 ENCOUNTER — Other Ambulatory Visit (HOSPITAL_COMMUNITY): Payer: Self-pay

## 2023-08-22 ENCOUNTER — Ambulatory Visit: Admitting: Infectious Diseases

## 2023-09-04 DIAGNOSIS — I739 Peripheral vascular disease, unspecified: Secondary | ICD-10-CM | POA: Diagnosis not present

## 2023-09-04 DIAGNOSIS — I70221 Atherosclerosis of native arteries of extremities with rest pain, right leg: Secondary | ICD-10-CM | POA: Diagnosis not present

## 2023-09-04 DIAGNOSIS — A419 Sepsis, unspecified organism: Secondary | ICD-10-CM | POA: Diagnosis not present

## 2023-09-04 DIAGNOSIS — I2510 Atherosclerotic heart disease of native coronary artery without angina pectoris: Secondary | ICD-10-CM | POA: Diagnosis not present

## 2023-09-04 DIAGNOSIS — Z89621 Acquired absence of right hip joint: Secondary | ICD-10-CM | POA: Diagnosis not present

## 2023-09-04 DIAGNOSIS — Z89611 Acquired absence of right leg above knee: Secondary | ICD-10-CM | POA: Diagnosis not present

## 2023-09-04 DIAGNOSIS — I639 Cerebral infarction, unspecified: Secondary | ICD-10-CM | POA: Diagnosis not present

## 2023-09-04 DIAGNOSIS — S88112A Complete traumatic amputation at level between knee and ankle, left lower leg, initial encounter: Secondary | ICD-10-CM | POA: Diagnosis not present

## 2023-09-06 ENCOUNTER — Ambulatory Visit: Admitting: Infectious Diseases

## 2023-09-06 ENCOUNTER — Encounter: Admitting: Family

## 2023-09-08 ENCOUNTER — Other Ambulatory Visit: Payer: Self-pay

## 2023-09-08 ENCOUNTER — Ambulatory Visit (INDEPENDENT_AMBULATORY_CARE_PROVIDER_SITE_OTHER): Admitting: Family

## 2023-09-08 ENCOUNTER — Ambulatory Visit: Admitting: Infectious Diseases

## 2023-09-08 ENCOUNTER — Encounter: Payer: Self-pay | Admitting: Infectious Diseases

## 2023-09-08 ENCOUNTER — Encounter: Payer: Self-pay | Admitting: Family

## 2023-09-08 VITALS — BP 184/67 | HR 59 | Temp 97.5°F

## 2023-09-08 DIAGNOSIS — Z758 Other problems related to medical facilities and other health care: Secondary | ICD-10-CM

## 2023-09-08 DIAGNOSIS — Z79899 Other long term (current) drug therapy: Secondary | ICD-10-CM | POA: Diagnosis not present

## 2023-09-08 DIAGNOSIS — T8130XA Disruption of wound, unspecified, initial encounter: Secondary | ICD-10-CM | POA: Diagnosis not present

## 2023-09-08 DIAGNOSIS — I70221 Atherosclerosis of native arteries of extremities with rest pain, right leg: Secondary | ICD-10-CM

## 2023-09-08 DIAGNOSIS — A4902 Methicillin resistant Staphylococcus aureus infection, unspecified site: Secondary | ICD-10-CM | POA: Diagnosis not present

## 2023-09-08 DIAGNOSIS — Z603 Acculturation difficulty: Secondary | ICD-10-CM

## 2023-09-08 DIAGNOSIS — A491 Streptococcal infection, unspecified site: Secondary | ICD-10-CM

## 2023-09-08 DIAGNOSIS — Z89621 Acquired absence of right hip joint: Secondary | ICD-10-CM

## 2023-09-08 HISTORY — DX: Other problems related to medical facilities and other health care: Z75.8

## 2023-09-08 NOTE — Progress Notes (Addendum)
 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   Prolapse of female pelvic organs 06/29/2023   Dehiscence of amputation stump of right lower extremity (HCC) 06/24/2023   Atherosclerosis of native arteries of extremities with gangrene, right leg (HCC) 06/24/2023   Surgical site infection 06/16/2023   Protein-calorie malnutrition, severe 06/10/2023   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   Elevated CK 06/06/2023   Normocytic anemia 06/04/2023   Vascular graft infection (HCC) 06/01/2023   Septic shock due to group A strep bacteremia and MRSA graft infection due to postsurgical femoropopliteal bypass graft infection (HCC) 05/29/2023   Thrombocytopenia (HCC) 05/12/2023   Statin myopathy 06/15/2022   Critical limb ischemia of right lower extremity (HCC) 01/26/2022   Critical lower limb ischemia (HCC)    Coronary artery disease involving native coronary artery of native heart without angina pectoris 10/03/2019   Essential hypertension 10/03/2019   Mixed hyperlipidemia 10/03/2019   Hyperlipidemia    Hypertension    Stroke (HCC) 08/23/2019   PAD (peripheral artery disease) (HCC) 08/23/2019   Cervical polyp 09/18/2018   Encounter for screening mammogram for breast cancer 05/05/2016   Fibrocystic breast changes of both breasts 04/29/2015   Current Outpatient Medications on File Prior to Visit  Medication Sig Dispense Refill   acetaminophen  (TYLENOL ) 325 MG tablet Take 1 tablet (325 mg total) by mouth every 4 (four) hours as needed for mild pain (pain score 1-3). 200 tablet 0   amLODipine  (NORVASC ) 5 MG tablet Take 1 tablet (5 mg total)  by mouth daily. 30 tablet 0   ascorbic acid  (VITAMIN C ) 500 MG tablet Take 0.5 tablets (250 mg total) by mouth 2 (two) times daily. 30 tablet 0   aspirin  EC 81 MG tablet Take 1 tablet (81 mg total) by mouth daily. Swallow whole. 30 tablet 12   cefadroxil  (DURICEF) 500 MG capsule Take 2 capsules (1,000 mg total) by mouth 2 (two) times daily. 120 capsule 0   doxycycline  (VIBRA -TABS) 100 MG tablet Take 1 tablet (100 mg total) by mouth 2 (two) times daily. 60 tablet 0   ezetimibe  (ZETIA ) 10 MG tablet Take 1 tablet (10 mg total) by mouth daily. 30 tablet 0   Fe Fum-Vit C-Vit B12-FA (TRIGELS-F FORTE) CAPS capsule Take 1 capsule by mouth daily with lunch. 30 capsule 0   feeding supplement (ENSURE PLUS HIGH PROTEIN) LIQD Take 237 mLs by mouth 2 (two) times daily between meals. 10000 mL 0   gabapentin  (NEURONTIN ) 400 MG capsule Take 1 capsule (400 mg total) by mouth 3 (three) times daily. 90 capsule 0   hydrALAZINE  (APRESOLINE ) 25 MG tablet Take 1 tablet (25 mg total) by mouth every 8 (eight) hours. 90 tablet 0   linezolid  (ZYVOX ) 600 MG tablet Take 1 tablet (600 mg total) by mouth every 12 (twelve) hours. 42 tablet 0   melatonin 5 MG TABS Take 1 tablet (5 mg total) by mouth at bedtime as needed. 30 tablet 0   metoprolol  succinate (TOPROL -XL) 25 MG 24 hr tablet Take 0.5 tablets (12.5 mg total) by mouth daily. 15 tablet 0   morphine  (MS CONTIN ) 15 MG 12 hr tablet Take 1 tablet (15 mg total) by  mouth every 12 (twelve) hours. 60 tablet 0   Multiple Vitamin (MULTIVITAMIN WITH MINERALS) TABS tablet Take 1 tablet by mouth daily. 30 tablet 0   Oxycodone  HCl 10 MG TABS Take 1 tablet (10 mg total) by mouth 2 (two) times daily as needed (break through pain). 60 tablet 0   oxymetazoline  (AFRIN) 0.05 % nasal spray Place 1 spray into both nostrils 2 (two) times daily as needed (nosebleeds). 30 mL 0   pantoprazole  (PROTONIX ) 40 MG tablet Take 1 tablet (40 mg total) by mouth daily. 30 tablet 0   polyethylene glycol powder  (GLYCOLAX /MIRALAX ) 17 GM/SCOOP powder Take 17 g by mouth daily. 238 g 0   senna-docusate (SENOKOT-S) 8.6-50 MG tablet Take 2 tablets by mouth daily with supper. 60 tablet 0   No current facility-administered medications on file prior to visit.   Subjective: Discussed the use of AI scribe software for clinical note transcription with the patient, who gave verbal consent to proceed.   68 Y O Female with prior h/o HTN, CVA, HLD, PAD w critical limb ischemia s/p vascular interventions complicated with Left groin AVG infection ( Group A Strep and MRSA) s/p multiple vascular interventions including AKA with eventual removal of complete removal of rt axillary fem graft, rt to left fem fem bypass graft ( confirmed with Vascular) + placement of  left fem to common femoral artery bypass graft, finally leading to Rt hip disarticulation for source control ( no concerns for remaining infection per Dr Harden). Seen by ID and recommended 6 weeks of antibiotics, initially on daptomycin  then transitioned to PO linezolid  during the later half of course through 7/9. She was discharged to rehab on 7/1. She was noted to have wound drainage during rehab admission, evaluated by Dr Harden agreste of wound vac and Doxycycline  added for a week. Patient was discharged home rehab with 3 weeks of PO linezolid  per d/w Dr Harden.    7/17 Accompanied by grand daughter. Spoke with interpreter. Completed doxycycline  on July 11th, taking PO linezolid  now. Seen by Ortho on 7/16, Wound looked approximated on 7/16 but seems to have dehisced today. Very minimal drainage. Denies fevers, chills. Denies nausea, vomiting or diarrhea.  Grandddaughter calling Dr Crist office for wound dehiscence. Told to to go to ED if any signs of infection   7/29 Accompanied by granddaughter. Spoke with in person interpreter. Reports being complaint to linezolid  and have pills to last till next Tuesday, compliant. They had contacted orthopedics after last appt  who reviewed the wound and were not too concerned and have an appt tomorrow. She was seen in the ED on 7/17 for leaking foley/s and painful urination, catheter was replaced and discharged on cephalexin  to treat for UTI with Urology fu. Minimal drainage from the rt lower abdomen wound and decreasing. Denies fevers, chills, nausea, vomiting, malaise. No complaints.   9/5 Accompanied by grandson. Spoke with video interpreter. Taking PO doxycycline  and amoxicillin as is, Rt hip wound has healed, denies any pain, swelling and tenderness. Denies fevers and chills. Seen by Orthopedics this morning and wound healed. No complaints.   Review of Systems: all systems reviewed with pertinent positives and negatives as listed above   Past Medical History:  Diagnosis Date   Cervical polyp 09/18/2018   Critical lower limb ischemia (HCC)    Encounter for screening mammogram for breast cancer 05/05/2016   Fibrocystic breast changes of both breasts 04/29/2015   Hyperlipidemia    Hypertension    PAD (peripheral artery disease) (HCC)  08/23/2019   Pelvic prolapse 06/2020   Pelvic ring in place, needs to be changed every 3 months   Stroke [facility]) 08/23/2019   Past Surgical History:  Procedure Laterality Date   ABDOMINAL AORTOGRAM W/LOWER EXTREMITY Bilateral 08/23/2019   Procedure: ABDOMINAL AORTOGRAM W/ Bilateral LOWER EXTREMITY [place];  Surgeon: Harvey Carlin BRAVO, MD;  Location: [facility] INVASIVE CV LAB;  Service: Cardiovascular;  Laterality: Bilateral;   AMPUTATION Right 05/31/2023   Procedure: AMPUTATION, ABOVE KNEE SARTORIOUS MUSCLE FLAP;  Surgeon: Serene Gaile ORN, MD;  Location: [facility] OR;  Service: Vascular;  Laterality: Right;   APPLICATION OF WOUND VAC Right 05/31/2023   Procedure: APPLICATION, WOUND VAC;  Surgeon: Serene Gaile ORN, MD;  Location: [facility] OR;  Service: Vascular;  Laterality: Right;   APPLICATION OF WOUND VAC Right 06/16/2023   Procedure: APPLICATION, WOUND VAC;  Surgeon: Serene Gaile ORN, MD;  Location:  [facility] OR;  Service: Vascular;  Laterality: Right;   APPLICATION OF WOUND VAC Right 06/21/2023   Procedure: APPLICATION, WOUND VAC;  Surgeon: Serene Gaile ORN, MD;  Location: [facility] OR;  Service: Vascular;  Laterality: Right;   APPLICATION, SKIN SUBSTITUTE Right 05/12/2023   Procedure: APPLICATION, SKIN SUBSTITUTE KERECIS 38SQ CM;  Surgeon: Serene Gaile ORN, MD;  Location: [facility] OR;  Service: Vascular;  Laterality: Right;   APPLICATION, SKIN SUBSTITUTE Right 06/16/2023   Procedure: APPLICATION, SKIN SUBSTITUTE;  Surgeon: Serene Gaile ORN, MD;  Location: [facility] OR;  Service: Vascular;  Laterality: Right;   APPLICATION, SKIN SUBSTITUTE Right 06/21/2023   Procedure: APPLICATION, SKIN SUBSTITUTE;  Surgeon: Serene Gaile ORN, MD;  Location: [facility] OR;  Service: Vascular;  Laterality: Right;   AVGG REMOVAL Right 05/29/2023   Procedure: REMOVAL BYPASS GRAFT RIGHT LEG;  Surgeon: Lanis Fonda BRAVO, MD;  Location: [facility] OR;  Service: Vascular;  Laterality: Right;   AXILLARY-FEMORAL BYPASS GRAFT Right 11/11/2019   Procedure: BYPASS GRAFT RIGHT AXILLA-BIFEMORAL;  Surgeon: Harvey Carlin BRAVO, MD;  Location: [facility] OR;  Service: Vascular;  Laterality: Right;   AXILLARY-FEMORAL BYPASS GRAFT Left 06/21/2023   Procedure: CREATION, BYPASS, ARTERIAL, LEFT AXILLARY TO LEFT FEMORAL, USING GRAFT;  Surgeon: Serene Gaile ORN, MD;  Location: [facility] OR;  Service: Vascular;  Laterality: Left;   BRAIN SURGERY     BREAST SURGERY Bilateral 2014   1980 R breast surgery   CEREBRAL ANEURYSM REPAIR     ENDARTERECTOMY FEMORAL Bilateral 11/11/2019   Procedure: ENDARTERECTOMY FEMORAL BILATERALLY;  Surgeon: Harvey Carlin BRAVO, MD;  Location: [facility] OR;  Service: Vascular;  Laterality: Bilateral;   ENDARTERECTOMY FEMORAL Right 01/26/2022   Procedure: REDO RIGHT COMMON FEMORAL ENDARTERECTOMY USING 0.8CM X 7.6CM HEMASHIELD PLATINUM FINESSE PATCH;  Surgeon: Serene Gaile ORN, MD;  Location: [facility] OR;  Service: Vascular;  Laterality: Right;   FEMORAL-POPLITEAL BYPASS GRAFT Right  01/26/2022   Procedure: RIGHT FEMORAL-POPLITEAL BYPASS GRAFT USING GORE X 80CM PROPATEN GRAFT;  Surgeon: Serene Gaile ORN, MD;  Location: [facility] OR;  Service: Vascular;  Laterality: Right;   FEMORAL-POPLITEAL BYPASS GRAFT Right 05/12/2023   Procedure: BYPASS GRAFT FEMORAL-POPLITEAL ARTERY;  Surgeon: Serene Gaile ORN, MD;  Location: [facility] OR;  Service: Vascular;  Laterality: Right;   HIP DISARTICULATION Right 06/28/2023   Procedure: MARGA, HIP;  Surgeon: Harden Jerona GAILS, MD;  Location: [facility] OR;  Service: Orthopedics;  Laterality: Right;   INCISION AND DRAINAGE OF WOUND Right 05/31/2023   Procedure: IRRIGATION AND DEBRIDEMENT WOUND;  Surgeon: Serene Gaile ORN, MD;  Location: [facility];  Service: Vascular;  Laterality: Right;  RIGHT GROIN WASHOUT  INCISION AND DRAINAGE OF WOUND Right 06/16/2023   Procedure: IRRIGATION AND DEBRIDEMENT WOUND;  Surgeon: Serene Gaile ORN, MD;  Location: [facility] OR;  Service: Vascular;  Laterality: Right;  Right Groin   INCISION AND DRAINAGE OF WOUND Right 06/21/2023   Procedure: IRRIGATION AND DEBRIDEMENT OF RIGHT GROIN AND RIGHT ABOVE KNEE AMPUTATION;  Surgeon: Serene Gaile ORN, MD;  Location: [facility] OR;  Service: Vascular;  Laterality: Right;   IR RADIOLOGIST EVAL & MGMT  04/02/2020   IR RADIOLOGIST EVAL & MGMT  03/29/2021   REMOVAL OF GRAFT Right 06/21/2023   Procedure: REMOVAL, GRAFT, RIGHT AXILLARY BIFEMORAL GRAFT;  Surgeon: Serene Gaile ORN, MD;  Location: [facility];  Service: Vascular;  Laterality: Right;   REVISION AMPUTATION, BELOW THE KNEE Right 06/16/2023   Procedure: REVISION AMPUTATION, ABOVE THE KNEE;  Surgeon: Serene Gaile ORN, MD;  Location: [facility];  Service: Vascular;  Laterality: Right;   THROMBECTOMY FEMORAL ARTERY Right 01/26/2022   Procedure: THROMBECTOMY RIGHT FEMORAL ARTERY;  Surgeon: Serene Gaile ORN, MD;  Location: [facility] OR;  Service: Vascular;  Laterality: Right;   THROMBECTOMY OF BYPASS GRAFT FEMORAL- TIBIAL ARTERY Right 05/12/2023   Procedure: THROMBECTOMY, BYPASS GRAFT,  ARTERIAL;  Surgeon: Serene Gaile ORN, MD;  Location: [facility] OR;  Service: Vascular;  Laterality: Right;   TUBAL LIGATION  1990    Social History   Tobacco Use   Smoking status: Former    Current packs/day: 0.00    Types: Cigarettes    Quit date: 10/04/2019    Years since quitting: 3.9    Passive exposure: Never   Smokeless tobacco: Never  Vaping Use   Vaping status: Never Used  Substance Use Topics   Alcohol  use: No   Drug use: No    Family History  Problem Relation Age of Onset   Heart Problems Father    Heart Problems Brother    Heart Problems Brother     Allergies  Allergen Reactions   Daptomycin      Elevated CK   Penicillins Other (See Comments)    Shaky and Dizziness 20 years ago    Health Maintenance  Topic Date Due   Medicare Annual Wellness (AWV)  Never done   COVID-19 Vaccine (1) Never done   Hepatitis C Screening  Never done   DTaP/Tdap/Td (1 - Tdap) Never done   Pneumococcal Vaccine: 50+ Years (1 of 2 - PCV) Never done   Zoster Vaccines- Shingrix (1 of 2) Never done   Colonoscopy  Never done   MAMMOGRAM  Never done   DEXA SCAN  Never done   Influenza Vaccine  08/04/2023   HPV VACCINES  Aged Out   Meningococcal B Vaccine  Aged Out    Objective: BP (!) 184/67   Pulse (!) 59   Temp (!) 97.5 F (36.4 C) (Temporal)   SpO2 99%    Physical Exam Constitutional:      Appearance: Normal appearance.  HENT:     Head: Normocephalic and atraumatic.      Mouth: Mucous membranes are moist.  Eyes:    Conjunctiva/sclera: Conjunctivae normal.     Pupils: Pupils are equal, round, and b/l symmetrical    Cardiovascular:     Rate and Rhythm: Normal rate and regular rhythm.     Heart sounds:   Pulmonary:     Effort: Pulmonary effort is normal.     Breath sounds:   Abdominal:     General: Non distended     Palpations:  Musculoskeletal:  General: sitting in the wheelchair  Rt lower abdominal wound, hip has healed     Skin:    General: Skin  is warm and dry.     Comments:  Neurological:     General: grossly non focal     Mental Status: awake, alert and oriented to person, place, and time.   Psychiatric:        Mood and Affect: Mood normal.   Lab Results Lab Results  Component Value Date   WBC 5.7 08/01/2023   HGB 10.7 (L) 08/01/2023   HCT 33.9 (L) 08/01/2023   MCV 97.7 08/01/2023   PLT 231 08/01/2023    Lab Results  Component Value Date   CREATININE 0.55 07/22/2023   BUN 13 07/22/2023   NA 137 07/22/2023   K 4.0 07/22/2023   CL 102 07/22/2023   CO2 24 07/22/2023    Lab Results  Component Value Date   ALT 27 07/22/2023   AST 27 07/22/2023   ALKPHOS 93 07/22/2023   BILITOT 0.5 07/22/2023    Lab Results  Component Value Date   CHOL 133 01/27/2022   HDL 36 (L) 01/27/2022   LDLCALC 85 01/27/2022   TRIG 59 01/27/2022   CHOLHDL 3.7 01/27/2022   No results found for: LABRPR, RPRTITER No results found for: HIV1RNAQUANT, HIV1RNAVL, CD4TABS   Assessment/Plan # Recent MRSA and Group A Rt LE AVG infection s/p rt hip disarticulation complicated  # RT lower abdominal wound dehiscence, healed # PAD - IV daptomycin  through 6/27> PO linezolid  till 9/5 (was briefly on PO doxycyline completed on 7/11)> PO doxycycline  and amoxicillin till date  - Wound healed   Plan  - stop antibiotics - 7/29 CBC reviewed and discussed( unremarkable) no need for labs  - fu in 3 weeks to monitor off antibiotics   I spent 20 minutes involved in face-to-face and non-face-to-face activities for this patient on the day of the visit. Professional time spent includes the following activities: Preparing to see the patient (review of tests), Obtaining and  reviewing separately obtained history (Orthopedics notes on 7/30 and 9/5), Performing a medically appropriate examination and evaluation, using interpreter, Documenting clinical information in the EMR, Independently interpreting results (not separately reported), Communicating  results to the patient, Counseling and educating the patient and Care coordination (not separately reported).   Of note, portions of this note may have been created with voice recognition software. While this note has been edited for accuracy, occasional wrong-word or 'sound-a-like' substitutions may have occurred due to the inherent limitations of voice recognition software.   Annalee Joseph, MD [facility] 09/08/2023, 9:56 AM

## 2023-09-08 NOTE — Progress Notes (Addendum)
 Post-Op Visit Note   Patient: Katherine Archer           Date of Birth: 02-28-1955           MRN: [id number] Visit Date: 09/08/2023 PCP: [name], MD  Chief Complaint: No chief complaint on file.   HPI:  HPI The patient is a 68 year old woman seen status post hip disarticulation.  She has been using a walker for prolonged distance walking but she is doing remarkably well she does continue to take some oral antibiotics she reports she was waiting to come back to the for office visit to be informed she could stop taking this  She complains of some numbness in the area of her incision but minimal pain Patient is a new right hip disarticulation  Patient's current comorbidities are not expected to impact the ability to function with the prescribed prosthesis. Patient verbally communicates a strong desire to use a prosthesis. Patient currently requires mobility aids to ambulate without a prosthesis.  Expects not to use mobility aids with a new prosthesis.  Patient is a K3 level ambulator that spends a lot of time walking around on uneven terrain over obstacles, up and down stairs, and ambulates with a variable cadence.  The patient will benefit from an MPK knee because they frequently encounter uneven terrain, stairs, and sometimes have to walk backwards. The stumble recovery and intuitive stance features will reduce fall risk and allow the patient to walk down stairs step over step.   Ortho Exam On examination right hip and abdomen the incisions are all well-healed there is no erythema no edema Visit Diagnoses: No diagnosis found.  Plan: Proceed with prosthesis set up.  Discussed possibility of increasing her gabapentin  we will hold off at this time she is currently just using it as needed hope that she will continue to have some desensitization as she received her prosthesis  Follow-Up Instructions: No follow-ups on file.   Imaging: No results found.  Orders:  No orders  of the defined types were placed in this encounter.  No orders of the defined types were placed in this encounter.    PMFS History: 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   Prolapse of female pelvic organs 06/29/2023   Dehiscence of amputation stump of right lower extremity (HCC) 06/24/2023   Atherosclerosis of native arteries of extremities with gangrene, right leg (HCC) 06/24/2023   Surgical site infection 06/16/2023   Protein-calorie malnutrition, severe 06/10/2023   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   Elevated CK 06/06/2023   Normocytic anemia 06/04/2023   Vascular graft infection (HCC) 06/01/2023   Septic shock due to group A strep bacteremia and MRSA graft infection due to postsurgical femoropopliteal bypass graft infection (HCC) 05/29/2023   Thrombocytopenia (HCC) 05/12/2023   Statin myopathy 06/15/2022   Critical limb ischemia of right lower extremity (HCC) 01/26/2022   Critical lower limb ischemia (HCC)    Coronary artery disease involving native coronary artery of native heart without angina pectoris 10/03/2019   Essential hypertension 10/03/2019   Mixed hyperlipidemia 10/03/2019   Hyperlipidemia    Hypertension    Stroke (HCC) 08/23/2019   PAD (peripheral artery disease) (HCC)  08/23/2019   Cervical polyp 09/18/2018   Encounter for screening mammogram for breast cancer 05/05/2016   Fibrocystic breast changes of both breasts 04/29/2015   Past Medical History:  Diagnosis Date   Cervical polyp 09/18/2018   Critical lower limb ischemia (HCC)    Encounter for screening mammogram for breast cancer 05/05/2016    Fibrocystic breast changes of both breasts 04/29/2015   Hyperlipidemia    Hypertension    PAD (peripheral artery disease) (HCC) 08/23/2019   Pelvic prolapse 06/2020   Pelvic ring in place, needs to be changed every 3 months   Stroke (HCC) 08/23/2019    Family History  Problem Relation Age of Onset   Heart Problems Father    Heart Problems Brother    Heart Problems Brother     Past Surgical History:  Procedure Laterality Date   ABDOMINAL AORTOGRAM W/LOWER EXTREMITY Bilateral 08/23/2019   Procedure: ABDOMINAL AORTOGRAM W/ Bilateral LOWER EXTREMITY Run[place];  Surgeon: Harvey Carlin BRAVO, MD;  Location: [facility];  Service: Cardiovascular;  Laterality: Bilateral;   AMPUTATION Right 05/31/2023   Procedure: AMPUTATION, ABOVE KNEE SARTORIOUS MUSCLE FLAP;  Surgeon: Serene Gaile ORN, MD;  Location: [facility];  Service: Vascular;  Laterality: Right;   APPLICATION OF WOUND VAC Right 05/31/2023   Procedure: APPLICATION, WOUND VAC;  Surgeon: Serene Gaile ORN, MD;  Location: [facility];  Service: Vascular;  Laterality: Right;   APPLICATION OF WOUND VAC Right 06/16/2023   Procedure: APPLICATION, WOUND VAC;  Surgeon: Serene Gaile ORN, MD;  Location: [facility];  Service: Vascular;  Laterality: Right;   APPLICATION OF WOUND VAC Right 06/21/2023   Procedure: APPLICATION, WOUND VAC;  Surgeon: Serene Gaile ORN, MD;  Location: [facility];  Service: Vascular;  Laterality: Right;   APPLICATION, SKIN SUBSTITUTE Right 05/12/2023   Procedure: APPLICATION, SKIN SUBSTITUTE KERECIS 38SQ CM;  Surgeon: Serene Gaile ORN, MD;  Location: [facility];  Service: Vascular;  Laterality: Right;   APPLICATION, SKIN SUBSTITUTE Right 06/16/2023   Procedure: APPLICATION, SKIN SUBSTITUTE;  Surgeon: Serene Gaile ORN, MD;  Location: [facility];  Service: Vascular;  Laterality: Right;   APPLICATION, SKIN SUBSTITUTE Right 06/21/2023   Procedure: APPLICATION, SKIN SUBSTITUTE;  Surgeon: Serene Gaile ORN, MD;  Location: [facility];  Service: Vascular;  Laterality:  Right;   AVGG REMOVAL Right 05/29/2023   Procedure: REMOVAL BYPASS GRAFT RIGHT LEG;  Surgeon: Lanis Fonda BRAVO, MD;  Location: [facility] OR;  Service: Vascular;  Laterality: Right;   AXILLARY-FEMORAL BYPASS GRAFT Right 11/11/2019   Procedure: BYPASS GRAFT RIGHT AXILLA-BIFEMORAL;  Surgeon: Harvey Carlin BRAVO, MD;  Location: [facility];  Service: Vascular;  Laterality: Right;   AXILLARY-FEMORAL BYPASS GRAFT Left 06/21/2023   Procedure: CREATION, BYPASS, ARTERIAL, LEFT AXILLARY TO LEFT FEMORAL, USING GRAFT;  Surgeon: Serene Gaile ORN, MD;  Location: [facility];  Service: Vascular;  Laterality: Left;   BRAIN SURGERY     BREAST SURGERY Bilateral 2014   1980 R breast surgery   CEREBRAL ANEURYSM REPAIR     ENDARTERECTOMY FEMORAL Bilateral 11/11/2019   Procedure: ENDARTERECTOMY FEMORAL BILATERALLY;  Surgeon: Harvey Carlin BRAVO, MD;  Location: [facility] OR;  Service: Vascular;  Laterality: Bilateral;   ENDARTERECTOMY FEMORAL Right 01/26/2022   Procedure: REDO RIGHT COMMON FEMORAL ENDARTERECTOMY USING 0.8CM X 7.6CM HEMASHIELD PLATINUM FINESSE PATCH;  Surgeon: Serene Gaile ORN, MD;  Location: [facility];  Service: Vascular;  Laterality: Right;   FEMORAL-POPLITEAL BYPASS GRAFT Right 01/26/2022   Procedure: RIGHT FEMORAL-POPLITEAL BYPASS GRAFT USING GORE X 80CM PROPATEN GRAFT;  Surgeon: Serene Gaile ORN, MD;  Location: [facility] OR;  Service: Vascular;  Laterality: Right;   FEMORAL-POPLITEAL BYPASS GRAFT Right 05/12/2023   Procedure: BYPASS GRAFT FEMORAL-POPLITEAL ARTERY;  Surgeon: Serene Gaile ORN, MD;  Location: [facility] OR;  Service: Vascular;  Laterality: Right;   HIP DISARTICULATION Right 06/28/2023   Procedure: MARGA, HIP;  Surgeon: Harden Jerona GAILS, MD;  Location: [facility] OR;  Service: Orthopedics;  Laterality: Right;   INCISION AND DRAINAGE OF WOUND Right 05/31/2023   Procedure: IRRIGATION AND DEBRIDEMENT WOUND;  Surgeon: Serene Gaile ORN, MD;  Location: [facility];  Service: Vascular;  Laterality: Right;  RIGHT GROIN WASHOUT   INCISION AND DRAINAGE  OF WOUND Right 06/16/2023   Procedure: IRRIGATION AND DEBRIDEMENT WOUND;  Surgeon: Serene Gaile ORN, MD;  Location: [facility];  Service: Vascular;  Laterality: Right;  Right Groin   INCISION AND DRAINAGE OF WOUND Right 06/21/2023   Procedure: IRRIGATION AND DEBRIDEMENT OF RIGHT GROIN AND RIGHT ABOVE KNEE AMPUTATION;  Surgeon: Serene Gaile ORN, MD;  Location: [facility];  Service: Vascular;  Laterality: Right;   IR RADIOLOGIST EVAL & MGMT  04/02/2020   IR RADIOLOGIST EVAL & MGMT  03/29/2021   REMOVAL OF GRAFT Right 06/21/2023   Procedure: REMOVAL, GRAFT, RIGHT AXILLARY BIFEMORAL GRAFT;  Surgeon: Serene Gaile ORN, MD;  Location: [facility];  Service: Vascular;  Laterality: Right;   REVISION AMPUTATION, BELOW THE KNEE Right 06/16/2023   Procedure: REVISION AMPUTATION, ABOVE THE KNEE;  Surgeon: Serene Gaile ORN, MD;  Location: [facility];  Service: Vascular;  Laterality: Right;   THROMBECTOMY FEMORAL ARTERY Right 01/26/2022   Procedure: THROMBECTOMY RIGHT FEMORAL ARTERY;  Surgeon: Serene Gaile ORN, MD;  Location: [facility];  Service: Vascular;  Laterality: Right;   THROMBECTOMY OF BYPASS GRAFT FEMORAL- TIBIAL ARTERY Right 05/12/2023   Procedure: THROMBECTOMY, BYPASS GRAFT, ARTERIAL;  Surgeon: Serene Gaile ORN, MD;  Location: [facility];  Service: Vascular;  Laterality: Right;   TUBAL LIGATION  1990   Social History   Occupational History   Not on file  Tobacco Use   Smoking status: Former    Current packs/day: 0.00    Types: Cigarettes    Quit date: 10/04/2019    Years since quitting: 3.9    Passive exposure: Never   Smokeless tobacco: Never  Vaping Use   Vaping status: Never Used  Substance and Sexual Activity   Alcohol  use: No   Drug use: No   Sexual activity: Not Currently

## 2023-09-15 DIAGNOSIS — Z978 Presence of other specified devices: Secondary | ICD-10-CM | POA: Diagnosis not present

## 2023-09-16 DIAGNOSIS — S88112A Complete traumatic amputation at level between knee and ankle, left lower leg, initial encounter: Secondary | ICD-10-CM | POA: Diagnosis not present

## 2023-09-16 DIAGNOSIS — I739 Peripheral vascular disease, unspecified: Secondary | ICD-10-CM | POA: Diagnosis not present

## 2023-09-16 DIAGNOSIS — Z89611 Acquired absence of right leg above knee: Secondary | ICD-10-CM | POA: Diagnosis not present

## 2023-09-16 DIAGNOSIS — A419 Sepsis, unspecified organism: Secondary | ICD-10-CM | POA: Diagnosis not present

## 2023-09-28 ENCOUNTER — Telehealth: Payer: Self-pay | Admitting: Family

## 2023-09-28 NOTE — Telephone Encounter (Signed)
 Email from [employer]:   Can you ask Erin per [place] to add the microprocessor tenants for [employer] (09/27/1955) for DOS 9/5.

## 2023-09-29 ENCOUNTER — Ambulatory Visit (INDEPENDENT_AMBULATORY_CARE_PROVIDER_SITE_OTHER): Admitting: Infectious Diseases

## 2023-09-29 ENCOUNTER — Encounter: Payer: Self-pay | Admitting: Infectious Diseases

## 2023-09-29 ENCOUNTER — Other Ambulatory Visit: Payer: Self-pay

## 2023-09-29 VITALS — BP 157/69 | HR 63 | Temp 97.9°F

## 2023-09-29 DIAGNOSIS — Z758 Other problems related to medical facilities and other health care: Secondary | ICD-10-CM

## 2023-09-29 DIAGNOSIS — A4902 Methicillin resistant Staphylococcus aureus infection, unspecified site: Secondary | ICD-10-CM

## 2023-09-29 DIAGNOSIS — Z603 Acculturation difficulty: Secondary | ICD-10-CM

## 2023-09-29 DIAGNOSIS — Z79899 Other long term (current) drug therapy: Secondary | ICD-10-CM | POA: Diagnosis not present

## 2023-09-29 NOTE — Progress Notes (Signed)
 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   Prolapse of female pelvic organs 06/29/2023   Dehiscence of amputation stump of right lower extremity (HCC) 06/24/2023   Atherosclerosis of native arteries of extremities with gangrene, right leg (HCC) 06/24/2023   Surgical site infection 06/16/2023   Protein-calorie malnutrition, severe 06/10/2023   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   Elevated CK 06/06/2023   Normocytic anemia 06/04/2023   Vascular graft infection 06/01/2023   Septic shock due to group A strep bacteremia and MRSA graft infection due to postsurgical femoropopliteal bypass graft infection (HCC) 05/29/2023   Thrombocytopenia 05/12/2023   Statin myopathy 06/15/2022   Critical limb ischemia of right lower extremity (HCC) 01/26/2022   Critical lower limb ischemia (HCC)    Coronary artery disease involving native coronary artery of native heart without angina pectoris 10/03/2019   Essential hypertension 10/03/2019   Mixed hyperlipidemia 10/03/2019   Hyperlipidemia    Hypertension    Stroke (HCC) 08/23/2019   PAD (peripheral artery disease) 08/23/2019   Cervical polyp 09/18/2018   Encounter for screening mammogram for breast cancer 05/05/2016   Fibrocystic breast changes of both breasts 04/29/2015   Current Outpatient Medications on File Prior to Visit  Medication Sig Dispense Refill   acetaminophen  (TYLENOL ) 325 MG tablet Take 1 tablet (325 mg total) by mouth every 4 (four) hours as needed for mild pain (pain score 1-3). 200 tablet 0   amLODipine  (NORVASC ) 5 MG tablet Take 1 tablet (5 mg  total) by mouth daily. 30 tablet 0   ascorbic acid  (VITAMIN C ) 500 MG tablet Take 0.5 tablets (250 mg total) by mouth 2 (two) times daily. 30 tablet 0   aspirin  EC 81 MG tablet Take 1 tablet (81 mg total) by mouth daily. Swallow whole. 30 tablet 12   cefadroxil  (DURICEF) 500 MG capsule Take 2 capsules (1,000 mg total) by mouth 2 (two) times daily. 120 capsule 0   ezetimibe  (ZETIA ) 10 MG tablet Take 1 tablet (10 mg total) by mouth daily. 30 tablet 0   Fe Fum-Vit C-Vit B12-FA (TRIGELS-F FORTE) CAPS capsule Take 1 capsule by mouth daily with lunch. 30 capsule 0   feeding supplement (ENSURE PLUS HIGH PROTEIN) LIQD Take 237 mLs by mouth 2 (two) times daily between meals. 10000 mL 0   gabapentin  (NEURONTIN ) 400 MG capsule Take 1 capsule (400 mg total) by mouth 3 (three) times daily. 90 capsule 0   hydrALAZINE  (APRESOLINE ) 25 MG tablet Take 1 tablet (25 mg total) by mouth every 8 (eight) hours. 90 tablet 0   melatonin 5 MG TABS Take 1 tablet (5 mg total) by mouth at bedtime as needed. 30 tablet 0   metoprolol  succinate (TOPROL -XL) 25 MG 24 hr tablet Take 0.5 tablets (12.5 mg total) by mouth daily. 15 tablet 0   morphine  (MS CONTIN ) 15 MG 12 hr tablet Take 1 tablet (15 mg total) by mouth every 12 (twelve) hours. 60 tablet 0   Multiple Vitamin (MULTIVITAMIN WITH MINERALS) TABS tablet Take 1 tablet by mouth daily. 30 tablet 0   Oxycodone  HCl 10 MG TABS Take 1 tablet (10 mg total) by mouth 2 (two)  times daily as needed (break through pain). 60 tablet 0   oxymetazoline  (AFRIN) 0.05 % nasal spray Place 1 spray into both nostrils 2 (two) times daily as needed (nosebleeds). 30 mL 0   pantoprazole  (PROTONIX ) 40 MG tablet Take 1 tablet (40 mg total) by mouth daily. 30 tablet 0   polyethylene glycol powder (GLYCOLAX /MIRALAX ) 17 GM/SCOOP powder Take 17 g by mouth daily. 238 g 0   senna-docusate (SENOKOT-S) 8.6-50 MG tablet Take 2 tablets by mouth daily with supper. 60 tablet 0   No current facility-administered  medications on file prior to visit.   Subjective: Discussed the use of AI scribe software for clinical note transcription with the patient, who gave verbal consent to proceed.   68 Y O Female with prior h/o HTN, CVA, HLD, PAD w critical limb ischemia s/p vascular interventions complicated with Left groin AVG infection ( Group A Strep and MRSA) s/p multiple vascular interventions including AKA with eventual removal of complete removal of rt axillary fem graft, rt to left fem fem bypass graft ( confirmed with Vascular) + placement of  left fem to common femoral artery bypass graft, finally leading to Rt hip disarticulation for source control ( no concerns for remaining infection per Dr Harden). Seen by ID and recommended 6 weeks of antibiotics, initially on daptomycin  then transitioned to PO linezolid  during the later half of course through 7/9. She was discharged to rehab on 7/1. She was noted to have wound drainage during rehab admission, evaluated by Dr Harden agreste of wound vac and Doxycycline  added for a week. Patient was discharged home rehab with 3 weeks of PO linezolid  per d/w Dr Harden.    7/17 Accompanied by grand daughter. Spoke with interpreter. Completed doxycycline  on July 11th, taking PO linezolid  now. Seen by Ortho on 7/16, Wound looked approximated on 7/16 but seems to have dehisced today. Very minimal drainage. Denies fevers, chills. Denies nausea, vomiting or diarrhea.  Grandddaughter calling Dr Crist office for wound dehiscence. Told to to go to ED if any signs of infection   7/29 Accompanied by granddaughter. Spoke with in person interpreter. Reports being complaint to linezolid  and have pills to last till next Tuesday, compliant. They had contacted orthopedics after last appt who reviewed the wound and were not too concerned and have an appt tomorrow. She was seen in the ED on 7/17 for leaking foley/s and painful urination, catheter was replaced and discharged on cephalexin  to treat  for UTI with Urology fu. Minimal drainage from the rt lower abdomen wound and decreasing. Denies fevers, chills, nausea, vomiting, malaise. No complaints.   9/5 Accompanied by grandson. Spoke with video interpreter. Taking PO doxycycline  and amoxicillin as is, Rt hip wound has healed, denies any pain, swelling and tenderness. Denies fevers and chills. Seen by Orthopedics this morning and wound healed. No complaints.  9/26 Spoke with spanish interpreter. Accompanied by family member. Off antibiotics. Wound at rt hip/lower abdomen stays healed. No concerns today.    Review of Systems: all systems reviewed with pertinent positives and negatives as listed above   Past Medical History:  Diagnosis Date   Cervical polyp 09/18/2018   Critical lower limb ischemia (HCC)    Encounter for screening mammogram for breast cancer 05/05/2016   Fibrocystic breast changes of both breasts 04/29/2015   Hyperlipidemia    Hypertension    PAD (peripheral artery disease) 08/23/2019   Pelvic prolapse 06/2020   Pelvic ring in place, needs to be changed every 3 months  Stroke (HCC) 08/23/2019   Past Surgical History:  Procedure Laterality Date   ABDOMINAL AORTOGRAM W/LOWER EXTREMITY Bilateral 08/23/2019   Procedure: ABDOMINAL AORTOGRAM W/ Bilateral LOWER EXTREMITY [place];  Surgeon: Harvey Carlin BRAVO, MD;  Location: [facility];  Service: Cardiovascular;  Laterality: Bilateral;   AMPUTATION Right 05/31/2023   Procedure: AMPUTATION, ABOVE KNEE SARTORIOUS MUSCLE FLAP;  Surgeon: Serene Gaile ORN, MD;  Location: [facility];  Service: Vascular;  Laterality: Right;   APPLICATION OF WOUND VAC Right 05/31/2023   Procedure: APPLICATION, WOUND VAC;  Surgeon: Serene Gaile ORN, MD;  Location: [facility];  Service: Vascular;  Laterality: Right;   APPLICATION OF WOUND VAC Right 06/16/2023   Procedure: APPLICATION, WOUND VAC;  Surgeon: Serene Gaile ORN, MD;  Location: [facility];  Service: Vascular;  Laterality: Right;   APPLICATION OF  WOUND VAC Right 06/21/2023   Procedure: APPLICATION, WOUND VAC;  Surgeon: Serene Gaile ORN, MD;  Location: [facility];  Service: Vascular;  Laterality: Right;   APPLICATION, SKIN SUBSTITUTE Right 05/12/2023   Procedure: APPLICATION, SKIN SUBSTITUTE KERECIS 38SQ CM;  Surgeon: Serene Gaile ORN, MD;  Location: [facility];  Service: Vascular;  Laterality: Right;   APPLICATION, SKIN SUBSTITUTE Right 06/16/2023   Procedure: APPLICATION, SKIN SUBSTITUTE;  Surgeon: Serene Gaile ORN, MD;  Location: [facility];  Service: Vascular;  Laterality: Right;   APPLICATION, SKIN SUBSTITUTE Right 06/21/2023   Procedure: APPLICATION, SKIN SUBSTITUTE;  Surgeon: Serene Gaile ORN, MD;  Location: [facility];  Service: Vascular;  Laterality: Right;   AVGG REMOVAL Right 05/29/2023   Procedure: REMOVAL BYPASS GRAFT RIGHT LEG;  Surgeon: Lanis Fonda BRAVO, MD;  Location: [facility] OR;  Service: Vascular;  Laterality: Right;   AXILLARY-FEMORAL BYPASS GRAFT Right 11/11/2019   Procedure: BYPASS GRAFT RIGHT AXILLA-BIFEMORAL;  Surgeon: Harvey Carlin BRAVO, MD;  [facility]: [facility];  Service: Vascular;  Laterality: Right;   AXILLARY-FEMORAL BYPASS GRAFT Left 06/21/2023   Procedure: CREATION, BYPASS, ARTERIAL, LEFT AXILLARY TO LEFT FEMORAL, USING GRAFT;  Surgeon: Serene Gaile ORN, MD;  [facility]: [facility];  Service: Vascular;  Laterality: Left;   BRAIN SURGERY     BREAST SURGERY Bilateral 2014   1980 R breast surgery   CEREBRAL ANEURYSM REPAIR     ENDARTERECTOMY FEMORAL Bilateral 11/11/2019   Procedure: ENDARTERECTOMY FEMORAL BILATERALLY;  Surgeon: Harvey Carlin BRAVO, MD;  Location: [facility] OR;  Service: Vascular;  Laterality: Bilateral;   ENDARTERECTOMY FEMORAL Right 01/26/2022   Procedure: REDO RIGHT COMMON FEMORAL ENDARTERECTOMY USING 0.8CM X 7.6CM HEMASHIELD PLATINUM FINESSE PATCH;  Surgeon: Serene Gaile ORN, MD;  [facility];  Service: Vascular;  Laterality: Right;   FEMORAL-POPLITEAL BYPASS GRAFT Right 01/26/2022   Procedure: RIGHT FEMORAL-POPLITEAL BYPASS GRAFT USING GORE  X 80CM PROPATEN GRAFT;  Surgeon: Serene Gaile ORN, MD;  [facility];  Service: Vascular;  Laterality: Right;   FEMORAL-POPLITEAL BYPASS GRAFT Right 05/12/2023   Procedure: BYPASS GRAFT FEMORAL-POPLITEAL ARTERY;  Surgeon: Serene Gaile ORN, MD;  Location: [facility] OR;  Service: Vascular;  Laterality: Right;   HIP DISARTICULATION Right 06/28/2023   Procedure: MARGA, HIP;  Surgeon: Harden Jerona GAILS, MD;  Location: [facility] OR;  Service: Orthopedics;  Laterality: Right;   INCISION AND DRAINAGE OF WOUND Right 05/31/2023   Procedure: IRRIGATION AND DEBRIDEMENT WOUND;  Surgeon: Serene Gaile ORN, MD;  [facility]: [facility];  Service: Vascular;  Laterality: Right;  RIGHT GROIN WASHOUT   INCISION AND DRAINAGE OF WOUND Right 06/16/2023   Procedure: IRRIGATION AND DEBRIDEMENT WOUND;  Surgeon: Serene Gaile ORN, MD;  Location: [facility];  Service: Vascular;  Laterality: Right;  Right Groin   INCISION AND DRAINAGE OF WOUND Right 06/21/2023   Procedure: IRRIGATION AND DEBRIDEMENT OF RIGHT GROIN AND RIGHT ABOVE KNEE AMPUTATION;  Surgeon: Serene Gaile ORN, MD;  Location: [facility];  Service: Vascular;  Laterality: Right;   IR RADIOLOGIST EVAL & MGMT  04/02/2020   IR RADIOLOGIST EVAL & MGMT  03/29/2021   REMOVAL OF GRAFT Right 06/21/2023   Procedure: REMOVAL, GRAFT, RIGHT AXILLARY BIFEMORAL GRAFT;  Surgeon: Serene Gaile ORN, MD;  [facility]: [facility];  Service: Vascular;  Laterality: Right;   REVISION AMPUTATION, BELOW THE KNEE Right 06/16/2023   Procedure: REVISION AMPUTATION, ABOVE THE KNEE;  Surgeon: Serene Gaile ORN, MD;  [facility];  Service: Vascular;  Laterality: Right;   THROMBECTOMY FEMORAL ARTERY Right 01/26/2022   Procedure: THROMBECTOMY RIGHT FEMORAL ARTERY;  Surgeon: Serene Gaile ORN, MD;  [facility]: [facility];  Service: Vascular;  Laterality: Right;   THROMBECTOMY OF BYPASS GRAFT FEMORAL- TIBIAL ARTERY Right 05/12/2023   Procedure: THROMBECTOMY, BYPASS GRAFT, ARTERIAL;  Surgeon: Serene Gaile ORN, MD;  Location: [facility];  Service:  Vascular;  Laterality: Right;   TUBAL LIGATION  1990    Social History   Tobacco Use   Smoking status: Former    Current packs/day: 0.00    Types: Cigarettes    Quit date: 10/04/2019    Years since quitting: 3.9    Passive exposure: Never   Smokeless tobacco: Never  Vaping Use   Vaping status: Never Used  Substance Use Topics   Alcohol  use: No   Drug use: No    Family History  Problem Relation Age of Onset   Heart Problems Father    Heart Problems Brother    Heart Problems Brother     Allergies  Allergen Reactions   Daptomycin      Elevated CK   Penicillins Other (See Comments)    Shaky and Dizziness 20 years ago    Health Maintenance  Topic Date Due   Medicare Annual Wellness (AWV)  Never done   COVID-19 Vaccine (1) Never done   Hepatitis C Screening  Never done   DTaP/Tdap/Td (1 - Tdap) Never done   Pneumococcal Vaccine: 50+ Years (1 of 2 - PCV) Never done   Zoster Vaccines- Shingrix (1 of 2) Never done   Mammogram  Never done   Colonoscopy  Never done   DEXA SCAN  Never done   Influenza Vaccine  08/04/2023   HPV VACCINES  Aged Out   Meningococcal B Vaccine  Aged Out    Objective: BP (!) 157/69   Pulse 63   Temp 97.9 F (36.6 C) (Oral)   SpO2 98%    Physical Exam Constitutional:      Appearance: Normal appearance.  HENT:     Head: Normocephalic and atraumatic.      Mouth: Mucous membranes are moist.  Eyes:    Conjunctiva/sclera: Conjunctivae normal.     Pupils: Pupils are equal, round, and b/l symmetrical    Cardiovascular:     Rate and Rhythm: Normal rate and regular rhythm.     Heart sounds:   Pulmonary:     Effort: Pulmonary effort is normal.     Breath sounds:   Abdominal:     General: Non distended     Palpations:  Musculoskeletal:        General: sitting in the wheelchair  Rt hip and abdominal wound healed     Skin:  General: Skin is warm and dry.     Comments:  Neurological:     General: grossly non focal      Mental Status: awake, alert and oriented to person, place, and time.   Psychiatric:        Mood and Affect: Mood normal.   Lab Results Lab Results  Component Value Date   WBC 5.7 08/01/2023   HGB 10.7 (L) 08/01/2023   HCT 33.9 (L) 08/01/2023   MCV 97.7 08/01/2023   PLT 231 08/01/2023    Lab Results  Component Value Date   CREATININE 0.55 07/22/2023   BUN 13 07/22/2023   NA 137 07/22/2023   K 4.0 07/22/2023   CL 102 07/22/2023   CO2 24 07/22/2023    Lab Results  Component Value Date   ALT 27 07/22/2023   AST 27 07/22/2023   ALKPHOS 93 07/22/2023   BILITOT 0.5 07/22/2023    Lab Results  Component Value Date   CHOL 133 01/27/2022   HDL 36 (L) 01/27/2022   LDLCALC 85 01/27/2022   TRIG 59 01/27/2022   CHOLHDL 3.7 01/27/2022   No results found for: LABRPR, RPRTITER No results found for: HIV1RNAQUANT, HIV1RNAVL, CD4TABS   Assessment/Plan # Recent MRSA and Group A Rt LE AVG infection s/p rt hip disarticulation complicated  # RT lower abdominal wound dehiscence, healed # PAD - IV daptomycin  through 6/27> PO linezolid  till 9/5 (was briefly on PO doxycyline completed on 7/11)> PO doxycycline  and amoxicillin till 9/5 - Wound healed off antibiotics  -fu as needed/concerns  I spent 20 minutes involved in face-to-face and non-face-to-face activities for this patient on the day of the visit. Professional time spent includes the following activities: Preparing to see the patient (review of tests),Performing a medically appropriate examination and evaluation, using interpreter, Documenting clinical information in the EMR, Independently interpreting results (not separately reported), Communicating results to the patient, Counseling and educating the patient and Care coordination (not separately reported).   Of note, portions of this note may have been created with voice recognition software. While this note has been edited for accuracy, occasional wrong-word or  'sound-a-like' substitutions may have occurred due to the inherent limitations of voice recognition software.   Annalee Joseph, MD [facility] 09/29/2023, 9:46 AM

## 2023-10-02 ENCOUNTER — Encounter: Attending: Registered Nurse | Admitting: Physical Medicine and Rehabilitation

## 2023-10-02 ENCOUNTER — Encounter: Payer: Self-pay | Admitting: Physical Medicine and Rehabilitation

## 2023-10-02 VITALS — BP 169/68 | HR 55 | Ht 66.0 in | Wt 100.0 lb

## 2023-10-02 DIAGNOSIS — I70229 Atherosclerosis of native arteries of extremities with rest pain, unspecified extremity: Secondary | ICD-10-CM | POA: Insufficient documentation

## 2023-10-02 DIAGNOSIS — Z89621 Acquired absence of right hip joint: Secondary | ICD-10-CM | POA: Insufficient documentation

## 2023-10-02 NOTE — Patient Instructions (Signed)
 Pt is a 68 yr old female with R hip disarticulation  d/c;d from CIR 07/13/23 due to poorly healing R AKA due to severe ischemia/PAD- also had MRSA bacteremia- also with: HTN, ABLA, Severe protein malnutrition, urinary retention; transaminitis-  Here for f/u on R hip disarticulation   Getting fitted for R hip disarticulation prosthesis tomorrow- once she gets it, call me and I will place therapy orders.   2. Off all pain meds  3. Has Rx and getting fitted for R hip disartic prosthesis- so doesn't need this.    4. I'm amazed how well you are doing-   5. F/U in 3 months- bring RW- and wear leg- Can use w/c if needed
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
## 2023-10-04 DIAGNOSIS — I739 Peripheral vascular disease, unspecified: Secondary | ICD-10-CM | POA: Diagnosis not present

## 2023-10-04 DIAGNOSIS — I70221 Atherosclerosis of native arteries of extremities with rest pain, right leg: Secondary | ICD-10-CM | POA: Diagnosis not present

## 2023-10-14 DIAGNOSIS — S88112A Complete traumatic amputation at level between knee and ankle, left lower leg, initial encounter: Secondary | ICD-10-CM | POA: Diagnosis not present

## 2023-10-14 DIAGNOSIS — I639 Cerebral infarction, unspecified: Secondary | ICD-10-CM | POA: Diagnosis not present

## 2023-10-14 DIAGNOSIS — A419 Sepsis, unspecified organism: Secondary | ICD-10-CM | POA: Diagnosis not present

## 2023-10-14 DIAGNOSIS — I2510 Atherosclerotic heart disease of native coronary artery without angina pectoris: Secondary | ICD-10-CM | POA: Diagnosis not present

## 2023-10-14 DIAGNOSIS — Z89611 Acquired absence of right leg above knee: Secondary | ICD-10-CM | POA: Diagnosis not present

## 2023-10-14 DIAGNOSIS — I739 Peripheral vascular disease, unspecified: Secondary | ICD-10-CM | POA: Diagnosis not present

## 2023-10-14 DIAGNOSIS — Z89621 Acquired absence of right hip joint: Secondary | ICD-10-CM | POA: Diagnosis not present

## 2023-10-16 DIAGNOSIS — Z89611 Acquired absence of right leg above knee: Secondary | ICD-10-CM | POA: Diagnosis not present

## 2023-10-16 DIAGNOSIS — A419 Sepsis, unspecified organism: Secondary | ICD-10-CM | POA: Diagnosis not present

## 2023-10-16 DIAGNOSIS — I739 Peripheral vascular disease, unspecified: Secondary | ICD-10-CM | POA: Diagnosis not present

## 2023-10-16 DIAGNOSIS — S88112A Complete traumatic amputation at level between knee and ankle, left lower leg, initial encounter: Secondary | ICD-10-CM | POA: Diagnosis not present

## 2023-10-23 ENCOUNTER — Encounter: Payer: Self-pay | Admitting: *Deleted

## 2023-10-23 NOTE — Progress Notes (Signed)
 Katherine Archer                                          MRN: [id number]   10/23/2023   The [facility]BCI Quality Team Specialist reviewed this patient medical record for the purposes of chart review for care gap closure. The following were reviewed: chart review for care gap closure-controlling blood pressure.    VBCI Quality Team

## 2023-11-04 DIAGNOSIS — I739 Peripheral vascular disease, unspecified: Secondary | ICD-10-CM | POA: Diagnosis not present

## 2023-11-04 DIAGNOSIS — I70221 Atherosclerosis of native arteries of extremities with rest pain, right leg: Secondary | ICD-10-CM | POA: Diagnosis not present

## 2023-11-14 DIAGNOSIS — I2510 Atherosclerotic heart disease of native coronary artery without angina pectoris: Secondary | ICD-10-CM | POA: Diagnosis not present

## 2023-11-14 DIAGNOSIS — S88112A Complete traumatic amputation at level between knee and ankle, left lower leg, initial encounter: Secondary | ICD-10-CM | POA: Diagnosis not present

## 2023-11-14 DIAGNOSIS — I739 Peripheral vascular disease, unspecified: Secondary | ICD-10-CM | POA: Diagnosis not present

## 2023-11-14 DIAGNOSIS — Z89621 Acquired absence of right hip joint: Secondary | ICD-10-CM | POA: Diagnosis not present

## 2023-11-14 DIAGNOSIS — I639 Cerebral infarction, unspecified: Secondary | ICD-10-CM | POA: Diagnosis not present

## 2023-11-14 DIAGNOSIS — Z89611 Acquired absence of right leg above knee: Secondary | ICD-10-CM | POA: Diagnosis not present

## 2023-11-14 DIAGNOSIS — A419 Sepsis, unspecified organism: Secondary | ICD-10-CM | POA: Diagnosis not present

## 2023-11-16 DIAGNOSIS — I739 Peripheral vascular disease, unspecified: Secondary | ICD-10-CM | POA: Diagnosis not present

## 2023-11-16 DIAGNOSIS — Z89611 Acquired absence of right leg above knee: Secondary | ICD-10-CM | POA: Diagnosis not present

## 2023-11-16 DIAGNOSIS — A419 Sepsis, unspecified organism: Secondary | ICD-10-CM | POA: Diagnosis not present

## 2023-11-16 DIAGNOSIS — S88112A Complete traumatic amputation at level between knee and ankle, left lower leg, initial encounter: Secondary | ICD-10-CM | POA: Diagnosis not present

## 2023-12-04 DIAGNOSIS — I739 Peripheral vascular disease, unspecified: Secondary | ICD-10-CM | POA: Diagnosis not present

## 2023-12-04 DIAGNOSIS — I70221 Atherosclerosis of native arteries of extremities with rest pain, right leg: Secondary | ICD-10-CM | POA: Diagnosis not present

## 2023-12-06 ENCOUNTER — Telehealth: Payer: Self-pay

## 2023-12-06 DIAGNOSIS — Z7189 Other specified counseling: Secondary | ICD-10-CM

## 2023-12-06 NOTE — Progress Notes (Signed)
 Complex Care Management Note Care Guide Note  12/06/2023 Name: Katherine Archer MRN: [id number] DOB: 01-Sep-1955   Complex Care Management Outreach Attempts: An unsuccessful telephone outreach was attempted today to offer the patient information about available complex care management services.  Follow Up Plan:  Additional outreach attempts will be made to offer the patient complex care management information and services.   Encounter Outcome:  No Answer  Jeoffrey Buffalo , RMA     [place]  [facility], [facility] Guide  Direct Dial: [phone]  Website: [email or web address]

## 2023-12-12 ENCOUNTER — Other Ambulatory Visit: Payer: Self-pay | Admitting: Family

## 2023-12-12 DIAGNOSIS — Z89621 Acquired absence of right hip joint: Secondary | ICD-10-CM

## 2023-12-12 DIAGNOSIS — I70221 Atherosclerosis of native arteries of extremities with rest pain, right leg: Secondary | ICD-10-CM

## 2023-12-13 NOTE — Progress Notes (Signed)
 Complex Care Management Note Care Guide Note  12/13/2023 Name: Katherine Archer MRN: [id number] DOB: 09-08-1955   Complex Care Management Outreach Attempts: A second unsuccessful outreach was attempted today to offer the patient with information about available complex care management services.  Follow Up Plan:  Additional outreach attempts will be made to offer the patient complex care management information and services.   Encounter Outcome:  No Answer  Jeoffrey Buffalo , RMA     [place]  [facility], [facility] Guide  Direct Dial: [phone]  Website: [email or web address]

## 2023-12-14 DIAGNOSIS — I2510 Atherosclerotic heart disease of native coronary artery without angina pectoris: Secondary | ICD-10-CM | POA: Diagnosis not present

## 2023-12-14 DIAGNOSIS — I639 Cerebral infarction, unspecified: Secondary | ICD-10-CM | POA: Diagnosis not present

## 2023-12-14 DIAGNOSIS — Z89621 Acquired absence of right hip joint: Secondary | ICD-10-CM | POA: Diagnosis not present

## 2023-12-14 DIAGNOSIS — A419 Sepsis, unspecified organism: Secondary | ICD-10-CM | POA: Diagnosis not present

## 2023-12-14 DIAGNOSIS — Z89611 Acquired absence of right leg above knee: Secondary | ICD-10-CM | POA: Diagnosis not present

## 2023-12-14 DIAGNOSIS — S88112A Complete traumatic amputation at level between knee and ankle, left lower leg, initial encounter: Secondary | ICD-10-CM | POA: Diagnosis not present

## 2023-12-14 DIAGNOSIS — I739 Peripheral vascular disease, unspecified: Secondary | ICD-10-CM | POA: Diagnosis not present

## 2023-12-16 DIAGNOSIS — S88112A Complete traumatic amputation at level between knee and ankle, left lower leg, initial encounter: Secondary | ICD-10-CM | POA: Diagnosis not present

## 2023-12-16 DIAGNOSIS — I739 Peripheral vascular disease, unspecified: Secondary | ICD-10-CM | POA: Diagnosis not present

## 2023-12-16 DIAGNOSIS — A419 Sepsis, unspecified organism: Secondary | ICD-10-CM | POA: Diagnosis not present

## 2023-12-16 DIAGNOSIS — Z89611 Acquired absence of right leg above knee: Secondary | ICD-10-CM | POA: Diagnosis not present

## 2023-12-22 DIAGNOSIS — Z89621 Acquired absence of right hip joint: Secondary | ICD-10-CM | POA: Diagnosis not present

## 2024-01-01 NOTE — Progress Notes (Signed)
 Complex Care Management Note Care Guide Note  01/01/2024 Name: Katherine Archer MRN: [id number] DOB: 1955/08/04   Complex Care Management Outreach Attempts: A third unsuccessful outreach was attempted today to offer the patient with information about available complex care management services.  Follow Up Plan:  No further outreach attempts will be made at this time. We have been unable to contact the patient to offer or enroll patient in complex care management services.  Encounter Outcome:  No Answer  Jeoffrey Buffalo , RMA     [place]  [facility], [facility] Guide  Direct Dial: [phone]  Website: [email or web address]

## 2024-01-05 ENCOUNTER — Encounter: Payer: Self-pay | Admitting: Physical Medicine and Rehabilitation

## 2024-01-05 ENCOUNTER — Encounter: Attending: Physical Medicine and Rehabilitation | Admitting: Physical Medicine and Rehabilitation

## 2024-01-05 VITALS — BP 171/75 | HR 60 | Ht 66.0 in | Wt 105.0 lb

## 2024-01-05 DIAGNOSIS — Z89621 Acquired absence of right hip joint: Secondary | ICD-10-CM | POA: Diagnosis not present

## 2024-01-05 DIAGNOSIS — I70229 Atherosclerosis of native arteries of extremities with rest pain, unspecified extremity: Secondary | ICD-10-CM | POA: Diagnosis not present

## 2024-01-05 DIAGNOSIS — I10 Essential (primary) hypertension: Secondary | ICD-10-CM | POA: Insufficient documentation

## 2024-01-05 NOTE — Patient Instructions (Signed)
 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    2. Itching could be dry skin or could be neuropathic pain-  just intermittent, so don't see a reason to add medicine for this.  Try vaseline for the dry skin.    3. For the BP- needs to take her meds regularly-  if you miss a day or run out of the meds- it increases risk of stroke.    4. F/U in 6 months-on R hip disarticulation.

## 2024-01-05 NOTE — Progress Notes (Signed)
 "  Subjective:    Patient ID: Katherine Archer, female    DOB: [birth year], 69 y.o.   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  Didn't take BP meds this AM yet   No pain.    R hip disarticulation- itching mainly- esp when it's cold, which is has lately.    Plans for prosthesis-  didn't bring it today.   Has taken steps in it, mainly not using RW too much.   5 steps so far at a time.   Puts on prosthesis daily- to get used to it.  Got it 2 weeks ago or so.   Is getting therapy- going to outpt therapy- [facility]-  1x/week. Q Monday    Felt slightly higher than other leg, but hasn't seen with [facility] quite yet to see if could be adjusted.     Pain Inventory Average Pain 0 Pain Right Now 0 My pain is itching  In the last 24 hours, has pain interfered with the following? General activity 0 Relation with others 0 Enjoyment of life 0 What TIME of day is your pain at its worst? na Sleep (in general) Good  Pain is worse with: unsure Pain improves with: rest Relief from Meds: na     Family History  Problem Relation Age of Onset   Heart Problems Father    Heart Problems Brother    Heart Problems Brother    Social History   Socioeconomic History   Marital status: Divorced    Spouse name: Not on file   Number of children: Not on file   Years of education: Not on file   Highest education level: Not on file  Occupational History   Not on file  Tobacco Use   Smoking status: Former    Current packs/day: 0.00    Average packs/day: 0.3 packs/day    Types: Cigarettes    Quit date: 10/04/2019    Years since quitting: 4.2    Passive exposure: Never   Smokeless tobacco: Never   Vaping Use   Vaping status: Never Used  Substance and Sexual Activity   Alcohol  use: No   Drug use: No   Sexual activity: Not Currently  Other Topics Concern   Not on file  Social History Narrative   Not on file   Social Drivers of Health   Tobacco Use: Medium Risk (01/05/2024)   Patient History    Smoking Tobacco Use: Former    Smokeless Tobacco Use: Never    Passive Exposure: Never  Physicist, Medical Strain: Not on file  Food Insecurity: No Food Insecurity (06/17/2023)   Epic    Worried About Programme Researcher, Broadcasting/film/video in the Last Year: Never true    Ran Out of Food in the Last Year: Never true  Transportation Needs: No Transportation Needs (06/17/2023)   Epic    Lack of Transportation (Medical): No    Lack of Transportation (Non-Medical): No  Physical Activity: Not on file  Stress: Not on file  Social Connections: Socially Isolated (06/17/2023)   Social Connection and Isolation Panel    Frequency of Communication with Friends and Family: More than three times a week  Frequency of Social Gatherings with Friends and Family: More than three times a week    Attends Religious Services: Never    Database Administrator or Organizations: No    Attends Banker Meetings: Never    Marital Status: Widowed  Depression (PHQ2-9): Low Risk (01/05/2024)   Depression (PHQ2-9)    PHQ-2 Score: 0  Alcohol  Screen: Not on file  Housing: Low Risk (06/17/2023)   Epic    Unable to Pay for Housing in the Last Year: No    Number of Times Moved in the Last Year: 0    Homeless in the Last Year: No  Utilities: Not At Risk (06/17/2023)   Epic    Threatened with loss of utilities: No  Health Literacy: Not on file   Past Surgical History:  Procedure Laterality Date   ABDOMINAL AORTOGRAM W/LOWER EXTREMITY Bilateral 08/23/2019   Procedure: ABDOMINAL AORTOGRAM W/ Bilateral LOWER EXTREMITY Runoff;  Surgeon: Harvey Carlin BRAVO, MD;  Location: [facility] INVASIVE CV LAB;  Service: Cardiovascular;   Laterality: Bilateral;   AMPUTATION Right 05/31/2023   Procedure: AMPUTATION, ABOVE KNEE SARTORIOUS MUSCLE FLAP;  Surgeon: Serene Gaile ORN, MD;  Location: [facility] OR;  Service: Vascular;  Laterality: Right;   APPLICATION OF WOUND VAC Right 05/31/2023   Procedure: APPLICATION, WOUND VAC;  Surgeon: Serene Gaile ORN, MD;  Location: [facility] OR;  Service: Vascular;  Laterality: Right;   APPLICATION OF WOUND VAC Right 06/16/2023   Procedure: APPLICATION, WOUND VAC;  Surgeon: Serene Gaile ORN, MD;  Location: [facility] OR;  Service: Vascular;  Laterality: Right;   APPLICATION OF WOUND VAC Right 06/21/2023   Procedure: APPLICATION, WOUND VAC;  Surgeon: Serene Gaile ORN, MD;  Location: [facility] OR;  Service: Vascular;  Laterality: Right;   APPLICATION, SKIN SUBSTITUTE Right 05/12/2023   Procedure: APPLICATION, SKIN SUBSTITUTE KERECIS 38SQ CM;  Surgeon: Serene Gaile ORN, MD;  Location: [facility];  Service: Vascular;  Laterality: Right;   APPLICATION, SKIN SUBSTITUTE Right 06/16/2023   Procedure: APPLICATION, SKIN SUBSTITUTE;  Surgeon: Serene Gaile ORN, MD;  [facility];  Service: Vascular;  Laterality: Right;   APPLICATION, SKIN SUBSTITUTE Right 06/21/2023   Procedure: APPLICATION, SKIN SUBSTITUTE;  Surgeon: Serene Gaile ORN, MD;  [facility];  Service: Vascular;  Laterality: Right;   AVGG REMOVAL Right 05/29/2023   Procedure: REMOVAL BYPASS GRAFT RIGHT LEG;  Surgeon: Lanis Fonda BRAVO, MD;  Location: [facility] OR;  Service: Vascular;  Laterality: Right;   AXILLARY-FEMORAL BYPASS GRAFT Right 11/11/2019   Procedure: BYPASS GRAFT RIGHT AXILLA-BIFEMORAL;  Surgeon: Harvey Carlin BRAVO, MD;  Location: [facility];  Service: Vascular;  Laterality: Right;   AXILLARY-FEMORAL BYPASS GRAFT Left 06/21/2023   Procedure: CREATION, BYPASS, ARTERIAL, LEFT AXILLARY TO LEFT FEMORAL, USING GRAFT;  Surgeon: Serene Gaile ORN, MD;  Location: [facility];  Service: Vascular;  Laterality: Left;   BRAIN SURGERY     BREAST SURGERY Bilateral 2014   1980 R breast surgery    CEREBRAL ANEURYSM REPAIR     ENDARTERECTOMY FEMORAL Bilateral 11/11/2019   Procedure: ENDARTERECTOMY FEMORAL BILATERALLY;  Surgeon: Harvey Carlin BRAVO, MD;  Location: [facility] OR;  Service: Vascular;  Laterality: Bilateral;   ENDARTERECTOMY FEMORAL Right 01/26/2022   Procedure: REDO RIGHT COMMON FEMORAL ENDARTERECTOMY USING 0.8CM X 7.6CM HEMASHIELD PLATINUM FINESSE PATCH;  Surgeon: Serene Gaile ORN, MD;  Location[facility];  Service: Vascular;  Laterality: Right;   FEMORAL-POPLITEAL BYPASS GRAFT Right 01/26/2022   Procedure: RIGHT FEMORAL-POPLITEAL BYPASS GRAFT USING GORE X 80CM PROPATEN GRAFT;  Surgeon: Serene Gaile ORN, MD;  Location: [facility] OR;  Service: Vascular;  Laterality: Right;   FEMORAL-POPLITEAL BYPASS GRAFT Right 05/12/2023   Procedure: BYPASS GRAFT FEMORAL-POPLITEAL ARTERY;  Surgeon: Serene Gaile ORN, MD;  Location: [facility] OR;  Service: Vascular;  Laterality: Right;   HIP DISARTICULATION Right 06/28/2023   Procedure: MARGA, HIP;  Surgeon: Harden Jerona GAILS, MD;  Location: [facility] OR;  Service: Orthopedics;  Laterality: Right;   INCISION AND DRAINAGE OF WOUND Right 05/31/2023   Procedure: IRRIGATION AND DEBRIDEMENT WOUND;  Surgeon: Serene Gaile ORN, MD;  Location[facility];  Service: Vascular;  Laterality: Right;  RIGHT GROIN WASHOUT   INCISION AND DRAINAGE OF WOUND Right 06/16/2023   Procedure: IRRIGATION AND DEBRIDEMENT WOUND;  Surgeon: Serene Gaile ORN, MD;  Location: [facility];  Service: Vascular;  Laterality: Right;  Right Groin   INCISION AND DRAINAGE OF WOUND Right 06/21/2023   Procedure: IRRIGATION AND DEBRIDEMENT OF RIGHT GROIN AND RIGHT ABOVE KNEE AMPUTATION;  Surgeon: Serene Gaile ORN, MD;  Location: [facility];  Service: Vascular;  Laterality: Right;   IR RADIOLOGIST EVAL & MGMT  04/02/2020   IR RADIOLOGIST EVAL & MGMT  03/29/2021   REMOVAL OF GRAFT Right 06/21/2023   Procedure: REMOVAL, GRAFT, RIGHT AXILLARY BIFEMORAL GRAFT;  Surgeon: Serene Gaile ORN, MD;  Location: [facility];  Service: Vascular;  Laterality:  Right;   REVISION AMPUTATION, BELOW THE KNEE Right 06/16/2023   Procedure: REVISION AMPUTATION, ABOVE THE KNEE;  Surgeon: Serene Gaile ORN, MD;  Location: [facility];  Service: Vascular;  Laterality: Right;   THROMBECTOMY FEMORAL ARTERY Right 01/26/2022   Procedure: THROMBECTOMY RIGHT FEMORAL ARTERY;  Surgeon: Serene Gaile ORN, MD;  Location: [facility];  Service: Vascular;  Laterality: Right;   THROMBECTOMY OF BYPASS GRAFT FEMORAL- TIBIAL ARTERY Right 05/12/2023   Procedure: THROMBECTOMY, BYPASS GRAFT, ARTERIAL;  Surgeon: Serene Gaile ORN, MD;  Location: [facility] OR;  Service: Vascular;  Laterality: Right;   TUBAL LIGATION  1990   Past Medical History:  Diagnosis Date   Cervical polyp 09/18/2018   Critical lower limb ischemia (HCC)    Encounter for screening mammogram for breast cancer 05/05/2016   Fibrocystic breast changes of both breasts 04/29/2015   Hyperlipidemia    Hypertension    PAD (peripheral artery disease) 08/23/2019   Pelvic prolapse 06/2020   Pelvic ring in place, needs to be changed every 3 months   Stroke (HCC) 08/23/2019   BP (!) 171/75   Pulse 60   Ht 5' 6 (1.676 m)   Wt 105 lb (47.6 kg) Comment: reported  SpO2 98%   BMI 16.95 kg/m   Opioid Risk Score:   Fall Risk Score:  `1  Depression screen [facility] 2/9     01/05/2024    9:54 AM 10/02/2023   11:03 AM 07/18/2023    9:44 AM 06/29/2020   10:59 AM  Depression screen PHQ 2/9  Decreased Interest 0 0 0 0  Down, Depressed, Hopeless 0 0 1 0  PHQ - 2 Score 0 0 1 0  Altered sleeping   0   Tired, decreased energy   0   Change in appetite   1   Feeling bad or failure about yourself    0   Trouble concentrating   0   Moving slowly or fidgety/restless   0   Suicidal thoughts   0   PHQ-9 Score   2    Difficult doing work/chores   Not difficult at all  Data saved with a previous flowsheet row definition     Review of Systems  Musculoskeletal:  Positive for gait problem.  All other systems reviewed and are negative.       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    2. Itching could be dry skin or could be neuropathic pain-  just intermittent, so don't see a reason to add medicine for this.  Try vaseline for the dry skin.    3. For the BP- needs to take her meds regularly-  if you miss a day or run out of the meds- it increases risk of stroke.    4. F/U in 6 months-on R hip disarticulation.    "

## 2024-02-06 ENCOUNTER — Other Ambulatory Visit: Payer: Self-pay

## 2024-02-06 DIAGNOSIS — I10 Essential (primary) hypertension: Secondary | ICD-10-CM | POA: Insufficient documentation

## 2024-02-06 DIAGNOSIS — E785 Hyperlipidemia, unspecified: Secondary | ICD-10-CM | POA: Insufficient documentation

## 2024-02-06 DIAGNOSIS — Z681 Body mass index (BMI) 19 or less, adult: Secondary | ICD-10-CM | POA: Insufficient documentation

## 2024-02-07 ENCOUNTER — Ambulatory Visit

## 2024-02-07 ENCOUNTER — Telehealth: Payer: Self-pay | Admitting: *Deleted

## 2024-02-07 ENCOUNTER — Ambulatory Visit: Payer: Self-pay | Admitting: Cardiology

## 2024-02-07 ENCOUNTER — Ambulatory Visit: Admitting: Cardiology

## 2024-02-07 ENCOUNTER — Encounter: Payer: Self-pay | Admitting: Cardiology

## 2024-02-07 VITALS — BP 180/92 | Ht 66.0 in | Wt 114.2 lb

## 2024-02-07 DIAGNOSIS — R011 Cardiac murmur, unspecified: Secondary | ICD-10-CM

## 2024-02-07 DIAGNOSIS — I739 Peripheral vascular disease, unspecified: Secondary | ICD-10-CM | POA: Insufficient documentation

## 2024-02-07 DIAGNOSIS — E782 Mixed hyperlipidemia: Secondary | ICD-10-CM | POA: Diagnosis not present

## 2024-02-07 DIAGNOSIS — R9431 Abnormal electrocardiogram [ECG] [EKG]: Secondary | ICD-10-CM

## 2024-02-07 DIAGNOSIS — I10 Essential (primary) hypertension: Secondary | ICD-10-CM | POA: Diagnosis not present

## 2024-02-07 DIAGNOSIS — R0989 Other specified symptoms and signs involving the circulatory and respiratory systems: Secondary | ICD-10-CM | POA: Insufficient documentation

## 2024-02-07 DIAGNOSIS — R079 Chest pain, unspecified: Secondary | ICD-10-CM | POA: Diagnosis not present

## 2024-02-07 DIAGNOSIS — R0609 Other forms of dyspnea: Secondary | ICD-10-CM | POA: Diagnosis not present

## 2024-02-07 DIAGNOSIS — I701 Atherosclerosis of renal artery: Secondary | ICD-10-CM

## 2024-02-07 LAB — ECHOCARDIOGRAM COMPLETE
AR max vel: 2.85 cm2
AV Area VTI: 3.29 cm2
AV Area mean vel: 2.73 cm2
AV Mean grad: 8 mmHg
AV Peak grad: 15.6 mmHg
Ao pk vel: 1.98 m/s
Area-P 1/2: 2.29 cm2
Height: 66 in
MV M vel: 2.05 m/s
MV Peak grad: 16.8 mmHg
MV VTI: 2.97 cm2
S' Lateral: 2.3 cm
Weight: 1827.2 [oz_av]

## 2024-02-07 MED ORDER — NITROGLYCERIN 0.4 MG SL SUBL: 0.4000 mg | SUBLINGUAL_TABLET | SUBLINGUAL | 6 refills | Status: AC | PRN

## 2024-02-07 NOTE — Telephone Encounter (Signed)
 Spoke  to patient's daughter per [facility] and gave her instructions regarding her Mom's STRESS TEST on 02/13/24 at 7:45.

## 2024-02-07 NOTE — Patient Instructions (Signed)
 Medication Instructions:  Your physician has prescribed you Nitroglycerin  to take as needed.  *If you need a refill on your cardiac medications before your next appointment, please call your pharmacy*   Lab Work: CMET, lipids, CBC, TSH If you have labs (blood work) drawn today and your tests are completely normal, you will receive your results only by: MyChart Message (if you have MyChart) OR A paper copy in the mail If you have any lab test that is abnormal or we need to change your treatment, we will call you to review the results.   Testing/Procedures:   [facility] [address] [place], [place] [address] Phone:  [phone]  February 07, 2024    Katherine Archer DOB: January 31, 1955 MRN: [id number] [address] [place] [address]   Dear Ms. Yingst,  You are scheduled for a Myocardial Perfusion Imaging Study.  Please arrive 15 minutes prior to your appointment time for registration and insurance purposes.  The test will take approximately 3 to 4 hours to complete; you may bring reading material.  If someone comes with you to your appointment, they will need to remain in the main lobby due to limited space in the testing area. **If you are pregnant or breastfeeding, please notify the nuclear lab prior to your appointment**  How to prepare for your Myocardial Perfusion Test: Do not eat or drink 3 hours prior to your test, except you may have water. Do not consume products containing caffeine (regular or decaffeinated) 12 hours prior to your test. (ex: coffee, chocolate, sodas, tea). Do bring a list of your current medications with you.  If not listed below, you may take your medications as normal. No tome metoprolol  (Lopressor , Toprol ) durante las 24 horas antes de la prueba. Traiga el medicamento a su cita, ya que es posible que tenga que tomarlo una vez que termine la prueba. Do wear comfortable clothes (no dresses or overalls) and walking  shoes, tennis shoes preferred (No heels or open toe shoes are allowed). Do NOT wear cologne, perfume, aftershave, or lotions (deodorant is allowed). If these instructions are not followed, your test will have to be rescheduled.  Por favor presntese a la direccin [address] para su examen.  Si tiene alguna inquietud o pregunta sobre su cita, puede llamar al [facility] at [phone].  If you cannot keep your appointment, please provide 24 hours notification to the Nuclear Lab, to avoid a possible $50 charge to your account.  Your physician has requested that you have an echocardiogram. Echocardiography is a painless test that uses sound waves to create images of your heart. It provides your doctor with information about the size and shape of your heart and how well your hearts chambers and valves are working. This procedure takes approximately one hour. There are no restrictions for this procedure. Please do NOT wear cologne, perfume, aftershave, or lotions (deodorant is allowed). Please arrive 15 minutes prior to your appointment time.  Please note: We ask at that you not bring children with you during ultrasound (echo/ vascular) testing. Due to room size and safety concerns, children are not allowed in the ultrasound rooms during exams. Our front office staff cannot provide observation of children in our lobby area while testing is being conducted. An adult accompanying a patient to their appointment will only be allowed in the ultrasound room at the discretion of the ultrasound technician under special circumstances. We apologize for any  inconvenience.    Follow-Up: At [facility], you and your health needs are our priority.  As part of our continuing mission to provide you with exceptional heart care, we have created designated Provider Care Teams.  These Care Teams include your primary Cardiologist (physician) and Advanced Practice  Providers (APPs -  Physician Assistants and Nurse Practitioners) who all work together to provide you with the care you need, when you need it.  We recommend signing up for the patient portal called MyChart.  Sign up information is provided on this After Visit Summary.  MyChart is used to connect with patients for Virtual Visits (Telemedicine).  Patients are able to view lab/test results, encounter notes, upcoming appointments, etc.  Non-urgent messages can be sent to your provider as well.   To learn more about what you can do with MyChart, go to [email or web address].    Your next appointment:   2 month(s)  Provider:   Jennifer Crape, MD   Other Instructions  Cardiac Nuclear Scan A cardiac nuclear scan is a test that is done to check the flow of blood to your heart. It is done when you are resting and when you are exercising. The test looks for problems such as: Not enough blood reaching a portion of the heart. The heart muscle not working as it should. You may need this test if you have: Heart disease. Lab results that are not normal. Had heart surgery or a balloon procedure to open up blocked arteries (angioplasty) or a small mesh tube (stent). Chest pain. Shortness of breath. Had a heart attack. In this test, a special dye (tracer) is put into your bloodstream. The tracer will travel to your heart. A camera will then take pictures of your heart to see how the tracer moves through your heart. This test is usually done at a hospital and takes 2-4 hours. Tell a doctor about: Any allergies you have. All medicines you are taking, including vitamins, herbs, eye drops, creams, and over-the-counter medicines. Any bleeding problems you have. Any surgeries you have had. Any medical conditions you have. Whether you are pregnant or may be pregnant. Any history of asthma or long-term (chronic) lung disease. Any history of heart rhythm disorders or heart valve conditions. What are the  risks? Your doctor will talk with you about risks. These may include: Serious chest pain and heart attack. This is only a risk if the stress portion of the test is done. Fast or uneven heartbeats (palpitations). A feeling of warmth in your chest. This feeling usually does not last long. Allergic reaction to the tracer. Shortness of breath or trouble breathing. What happens before the test? Ask your doctor about changing or stopping your normal medicines. Follow instructions from your doctor about what you cannot eat or drink. Remove your jewelry on the day of the test. Ask your doctor if you need to avoid nicotine or caffeine. What happens during the test? An IV tube will be inserted into one of your veins. Your doctor will give you a small amount of tracer through the IV tube. You will wait for 20-40 minutes while the tracer moves through your bloodstream. Your heart will be monitored with an electrocardiogram (ECG). You will lie down on an exam table. Pictures of your heart will be taken for about 15-20 minutes. You may also have a stress test. For this test, one of these things may be done: You will be asked to exercise on a treadmill or a stationary bike.  You will be given medicines that will make your heart work harder. This is done if you are unable to exercise. When blood flow to your heart has peaked, a tracer will again be given through the IV tube. After 20-40 minutes, you will get back on the exam table. More pictures will be taken of your heart. Depending on the tracer that is used, more pictures may need to be taken 3-4 hours later. Your IV tube will be removed when the test is over. The test may vary among doctors and hospitals. What happens after the test? Ask your doctor: Whether you can return to your normal schedule, including diet, activities, travel, and medicines. Whether you should drink more fluids. This will help to remove the tracer from your body. Ask your  doctor, or the department that is doing the test: When will my results be ready? How will I get my results? What are my treatment options? What other tests do I need? What are my next steps? This information is not intended to replace advice given to you by your health care provider. Make sure you discuss any questions you have with your health care provider. Document Revised: 05/18/2021 Document Reviewed: 05/18/2021 Elsevier Patient Education  2023 Elsevier Inc.  Echocardiogram An echocardiogram is a test that uses sound waves (ultrasound) to produce images of the heart. Images from an echocardiogram can provide important information about: Heart size and shape. The size and thickness and movement of your heart's walls. Heart muscle function and strength. Heart valve function or if you have stenosis. Stenosis is when the heart valves are too narrow. If blood is flowing backward through the heart valves (regurgitation). A tumor or infectious growth around the heart valves. Areas of heart muscle that are not working well because of poor blood flow or injury from a heart attack. Aneurysm detection. An aneurysm is a weak or damaged part of an artery wall. The wall bulges out from the normal force of blood pumping through the body. Tell a health care provider about: Any allergies you have. All medicines you are taking, including vitamins, herbs, eye drops, creams, and over-the-counter medicines. Any blood disorders you have. Any surgeries you have had. Any medical conditions you have. Whether you are pregnant or may be pregnant. What are the risks? Generally, this is a safe test. However, problems may occur, including an allergic reaction to dye (contrast) that may be used during the test. What happens before the test? No specific preparation is needed. You may eat and drink normally. What happens during the test?  You will take off your clothes from the waist up and put on a hospital  gown. Electrodes or electrocardiogram (ECG)patches may be placed on your chest. The electrodes or patches are then connected to a device that monitors your heart rate and rhythm. You will lie down on a table for an ultrasound exam. A gel will be applied to your chest to help sound waves pass through your skin. A handheld device, called a transducer, will be pressed against your chest and moved over your heart. The transducer produces sound waves that travel to your heart and bounce back (or echo back) to the transducer. These sound waves will be captured in real-time and changed into images of your heart that can be viewed on a video monitor. The images will be recorded on a computer and reviewed by your health care provider. You may be asked to change positions or hold your breath for a short  time. This makes it easier to get different views or better views of your heart. In some cases, you may receive contrast through an IV in one of your veins. This can improve the quality of the pictures from your heart. The procedure may vary among health care providers and hospitals. What can I expect after the test? You may return to your normal, everyday life, including diet, activities, and medicines, unless your health care provider tells you not to do that. Follow these instructions at home: It is up to you to get the results of your test. Ask your health care provider, or the department that is doing the test, when your results will be ready. Keep all follow-up visits. This is important. Summary An echocardiogram is a test that uses sound waves (ultrasound) to produce images of the heart. Images from an echocardiogram can provide important information about the size and shape of your heart, heart muscle function, heart valve function, and other possible heart problems. You do not need to do anything to prepare before this test. You may eat and drink normally. After the echocardiogram is completed, you  may return to your normal, everyday life, unless your health care provider tells you not to do that. This information is not intended to replace advice given to you by your health care provider. Make sure you discuss any questions you have with your health care provider. Document Revised: 09/02/2020 Document Reviewed: 08/13/2019 Elsevier Patient Education  2023 Elsevier Inc.    Important Information About Sugar

## 2024-02-07 NOTE — Progress Notes (Signed)
 " Cardiology Office Note:    Date:  02/07/2024   ID:  Katherine Archer, DOB October 05, 1955, MRN [id number]  PCP:  Katherine Glisson, MD  Cardiologist:  Katherine JONELLE Crape, MD   Referring MD: Katherine Hun, MD    ASSESSMENT:    1. Essential hypertension   2. DOE (dyspnea on exertion)   3. Peripheral arterial disease   4. Chest pain, unspecified type   5. Abnormal electrocardiogram   6. Chest pain of uncertain etiology   7. Abdominal bruit    PLAN:    In order of problems listed above:  Chest pain and coronary artery disease: Secondary prevention stressed with the patient.  Importance of compliance with diet medication stressed and patient verbalized standing.  Sublingual nitroglycerin  prescription was sent, its protocol and 911 protocol explained and the patient vocalized understanding questions were answered to the patient's satisfaction.  To evaluate this further we will do a Lexiscan sestamibi.  She is agreeable. Cardiac murmur: Echocardiogram will be done to assess murmur heard on auscultation. Essential hypertension: Blood pressure is elevated.  She has not taken her medications this morning and she will go home and take her medications and keep a track of her blood pressures and get a log for us  next week when she comes for a stress test.  Salt intake issues were discussed diet emphasized.  She promises to do better.  She mentions to me that her blood pressures at home are in the range of 130/70. Abdominal bruit: Will evaluate for renal artery stenosis with Doppler.  She is agreeable. Mixed dyslipidemia: Not on statin therapy (her recent.  She is fasting and we will do complete blood work today.  Goal LDL less than 60. Former smoker: Promises never to get back to smoking again. Patient will be seen in follow-up appointment in 2 months or earlier if the patient has any concerns.    Medication Adjustments/Labs and Tests Ordered: Current medicines are reviewed at length with the patient today.   Concerns regarding medicines are outlined above.  Orders Placed This Encounter  Procedures   EKG 12-Lead   No orders of the defined types were placed in this encounter.    No chief complaint on file.    History of Present Illness:    Katherine Archer is a 69 y.o. female.  Patient has past medical history of coronary artery disease, nonobstructive in nature by CT coronary angiography in the past, essential hypertension, mixed dyslipidemia, ex-smoker and peripheral arterial disease.  She has had right lower extremity amputation with peripheral vascular disease.  She gives history of chest pain at times not related to exertion.  It is pressure-like sensation no radiation to the neck or to the arms and does not come with stress or any such issues.  No orthopnea or PND.  At the time of my evaluation, the patient is alert awake oriented and in no distress.  She has not taken her blood pressure medication this morning.  Past Medical History:  Diagnosis Date   Acute blood loss anemia 07/13/2023   Atherosclerosis of native arteries of extremities with gangrene, right leg (HCC) 06/24/2023   BMI less than 19,adult    Catheter cystitis 08/01/2023   Cervical polyp 09/18/2018   Coronary artery disease involving native coronary artery of native heart without angina pectoris 10/03/2019   Critical limb ischemia of right lower extremity (HCC) 01/26/2022   Critical lower limb ischemia (HCC)    Dehiscence of amputation stump of right lower extremity (HCC)  06/24/2023   Dyslipidemia, goal LDL below 70    Elevated CK 06/06/2023   Encounter for screening mammogram for breast cancer 05/05/2016   Essential hypertension 10/03/2019   Fibrocystic breast changes of both breasts 04/29/2015   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   Hyperlipidemia    Hypertension    Hyponatremia 07/13/2023   Infection due to Streptococcus pyogenes 07/20/2023   Language barrier 09/08/2023   Malignant hypertension     Medication management 07/20/2023   Mixed hyperlipidemia 10/03/2019   MRSA (methicillin resistant Staphylococcus aureus) infection 07/20/2023   Normocytic anemia 06/04/2023   PAD (peripheral artery disease) 08/23/2019   Pelvic prolapse 06/2020   Pelvic ring in place, needs to be changed every 3 months   Prolapse of female pelvic organs 06/29/2023   Protein-calorie malnutrition, severe 06/10/2023   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   Septic shock due to group A strep bacteremia and MRSA graft infection due to postsurgical femoropopliteal bypass graft infection (HCC) 05/29/2023   Statin myopathy 06/15/2022   Stroke (HCC) 08/23/2019   Surgical site infection 06/16/2023   Vascular graft infection 06/01/2023   Wound dehiscence 07/20/2023    Past Surgical History:  Procedure Laterality Date   ABDOMINAL AORTOGRAM W/LOWER EXTREMITY Bilateral 08/23/2019   Procedure: ABDOMINAL AORTOGRAM W/ Bilateral LOWER EXTREMITY Run[place];  Surgeon: Harvey Carlin BRAVO, MD;  Location: [facility];  Service: Cardiovascular;  Laterality: Bilateral;   AMPUTATION Right 05/31/2023   Procedure: AMPUTATION, ABOVE KNEE SARTORIOUS MUSCLE FLAP;  Surgeon: Serene Gaile ORN, MD;  Location: [facility];  Service: Vascular;  Laterality: Right;   APPLICATION OF WOUND VAC Right 05/31/2023   Procedure: APPLICATION, WOUND VAC;  Surgeon: Serene Gaile ORN, MD;  Location: [facility];  Service: Vascular;  Laterality: Right;   APPLICATION OF WOUND VAC Right 06/16/2023   Procedure: APPLICATION, WOUND VAC;  Surgeon: Serene Gaile ORN, MD;  Location: [facility];  Service: Vascular;  Laterality: Right;   APPLICATION OF WOUND VAC Right 06/21/2023   Procedure: APPLICATION, WOUND VAC;  Surgeon: Serene Gaile ORN, MD;  Location: [facility];  Service: Vascular;  Laterality: Right;   APPLICATION, SKIN SUBSTITUTE Right 05/12/2023   Procedure: APPLICATION, SKIN SUBSTITUTE KERECIS 38SQ CM;  Surgeon: Serene Gaile ORN, MD;  Location: [facility];  Service: Vascular;   Laterality: Right;   APPLICATION, SKIN SUBSTITUTE Right 06/16/2023   Procedure: APPLICATION, SKIN SUBSTITUTE;  Surgeon: Serene Gaile ORN, MD;  Location: [facility];  Service: Vascular;  Laterality: Right;   APPLICATION, SKIN SUBSTITUTE Right 06/21/2023   Procedure: APPLICATION, SKIN SUBSTITUTE;  Surgeon: Serene Gaile ORN, MD;  Location: [facility];  Service: Vascular;  Laterality: Right;   AVGG REMOVAL Right 05/29/2023   Procedure: REMOVAL BYPASS GRAFT RIGHT LEG;  Surgeon: Lanis Fonda BRAVO, MD;  Location[facility];  Service: Vascular;  Laterality: Right;   AXILLARY-FEMORAL BYPASS GRAFT Right 11/11/2019   Procedure: BYPASS GRAFT RIGHT AXILLA-BIFEMORAL;  Surgeon: Harvey Carlin BRAVO, MD;  Location: [facility];  Service: Vascular;  Laterality: Right;   AXILLARY-FEMORAL BYPASS GRAFT Left 06/21/2023   Procedure: CREATION, BYPASS, ARTERIAL, LEFT AXILLARY TO LEFT FEMORAL, USING GRAFT;  Surgeon: Serene Gaile ORN, MD;  Location: [facility];  Service: Vascular;  Laterality: Left;   BRAIN SURGERY     BREAST SURGERY Bilateral 2014   1980 R breast surgery   CEREBRAL ANEURYSM REPAIR     ENDARTERECTOMY FEMORAL Bilateral 11/11/2019   Procedure: ENDARTERECTOMY FEMORAL BILATERALLY;  Surgeon: Harvey Carlin BRAVO, MD;  Location: [facility];  Service:  Vascular;  Laterality: Bilateral;   ENDARTERECTOMY FEMORAL Right 01/26/2022   Procedure: REDO RIGHT COMMON FEMORAL ENDARTERECTOMY USING 0.8CM X 7.6CM HEMASHIELD PLATINUM FINESSE PATCH;  Surgeon: Serene Gaile ORN, MD;  Location: [facility];  Service: Vascular;  Laterality: Right;   FEMORAL-POPLITEAL BYPASS GRAFT Right 01/26/2022   Procedure: RIGHT FEMORAL-POPLITEAL BYPASS GRAFT USING GORE X 80CM PROPATEN GRAFT;  Surgeon: Serene Gaile ORN, MD;  Location: [facility];  Service: Vascular;  Laterality: Right;   FEMORAL-POPLITEAL BYPASS GRAFT Right 05/12/2023   Procedure: BYPASS GRAFT FEMORAL-POPLITEAL ARTERY;  Surgeon: Serene Gaile ORN, MD;  Location: [facility] OR;  Service: Vascular;  Laterality: Right;   HIP DISARTICULATION  Right 06/28/2023   Procedure: MARGA, HIP;  Surgeon: Harden Jerona GAILS, MD;  Location: [facility] OR;  Service: Orthopedics;  Laterality: Right;   INCISION AND DRAINAGE OF WOUND Right 05/31/2023   Procedure: IRRIGATION AND DEBRIDEMENT WOUND;  Surgeon: Serene Gaile ORN, MD;  Location: [facility];  Service: Vascular;  Laterality: Right;  RIGHT GROIN WASHOUT   INCISION AND DRAINAGE OF WOUND Right 06/16/2023   Procedure: IRRIGATION AND DEBRIDEMENT WOUND;  Surgeon: Serene Gaile ORN, MD;  Location: [facility];  Service: Vascular;  Laterality: Right;  Right Groin   INCISION AND DRAINAGE OF WOUND Right 06/21/2023   Procedure: IRRIGATION AND DEBRIDEMENT OF RIGHT GROIN AND RIGHT ABOVE KNEE AMPUTATION;  Surgeon: Serene Gaile ORN, MD;  Location: [facility];  Service: Vascular;  Laterality: Right;   IR RADIOLOGIST EVAL & MGMT  04/02/2020   IR RADIOLOGIST EVAL & MGMT  03/29/2021   REMOVAL OF GRAFT Right 06/21/2023   Procedure: REMOVAL, GRAFT, RIGHT AXILLARY BIFEMORAL GRAFT;  Surgeon: Serene Gaile ORN, MD;  Location: [facility];  Service: Vascular;  Laterality: Right;   REVISION AMPUTATION, BELOW THE KNEE Right 06/16/2023   Procedure: REVISION AMPUTATION, ABOVE THE KNEE;  Surgeon: Serene Gaile ORN, MD;  Location: [facility];  Service: Vascular;  Laterality: Right;   THROMBECTOMY FEMORAL ARTERY Right 01/26/2022   Procedure: THROMBECTOMY RIGHT FEMORAL ARTERY;  Surgeon: Serene Gaile ORN, MD;  Location: [facility];  Service: Vascular;  Laterality: Right;   THROMBECTOMY OF BYPASS GRAFT FEMORAL- TIBIAL ARTERY Right 05/12/2023   Procedure: THROMBECTOMY, BYPASS GRAFT, ARTERIAL;  Surgeon: Serene Gaile ORN, MD;  Location: [facility];  Service: Vascular;  Laterality: Right;   TUBAL LIGATION  1990    Current Medications: Active Medications[1]   Allergies:   Daptomycin  and Penicillins   Social History   Socioeconomic History   Marital status: Divorced    Spouse name: Not on file   Number of children: Not on file   Years of education: Not on file   Highest  education level: Not on file  Occupational History   Not on file  Tobacco Use   Smoking status: Former    Current packs/day: 0.00    Average packs/day: 0.3 packs/day    Types: Cigarettes    Quit date: 10/04/2019    Years since quitting: 4.3    Passive exposure: Never   Smokeless tobacco: Never  Vaping Use   Vaping status: Never Used  Substance and Sexual Activity   Alcohol  use: No   Drug use: No   Sexual activity: Not Currently  Other Topics Concern   Not on file  Social History Narrative   Not on file   Social Drivers of Health   Tobacco Use: Medium Risk (02/07/2024)   Patient History    Smoking Tobacco Use: Former    Smokeless Tobacco Use: Never  Passive Exposure: Never  Physicist, Medical Strain: Not on file  Food Insecurity: No Food Insecurity (06/17/2023)   Epic    Worried About Programme Researcher, Broadcasting/film/video in the Last Year: Never true    Ran Out of Food in the Last Year: Never true  Transportation Needs: No Transportation Needs (06/17/2023)   Epic    Lack of Transportation (Medical): No    Lack of Transportation (Non-Medical): No  Physical Activity: Not on file  Stress: Not on file  Social Connections: Socially Isolated (06/17/2023)   Social Connection and Isolation Panel    Frequency of Communication with Friends and Family: More than three times a week    Frequency of Social Gatherings with Friends and Family: More than three times a week    Attends Religious Services: Never    Database Administrator or Organizations: No    Attends Banker Meetings: Never    Marital Status: Widowed  Depression (PHQ2-9): Low Risk (01/05/2024)   Depression (PHQ2-9)    PHQ-2 Score: 0  Alcohol  Screen: Not on file  Housing: Low Risk (06/17/2023)   Epic    Unable to Pay for Housing in the Last Year: No    Number of Times Moved in the Last Year: 0    Homeless in the Last Year: No  Utilities: Not At Risk (06/17/2023)   Epic    Threatened with loss of utilities: No  Health  Literacy: Not on file     Family History: The patient's family history includes Heart Problems in her brother, brother, and father.  ROS:   Please see the history of present illness.    All other systems reviewed and are negative.  EKGs/Labs/Other Studies Reviewed:    The following studies were reviewed today: .SABRAEKG Interpretation Date/Time:  Wednesday February 07 2024 08:55:00 EST Ventricular Rate:  69 PR Interval:  128 QRS Duration:  88 QT Interval:  472 QTC Calculation: 505 R Axis:   -7  Text Interpretation: Normal sinus rhythm with sinus arrhythmia Possible Left atrial enlargement Left ventricular hypertrophy with repolarization abnormality Prolonged QT Abnormal ECG When compared with ECG of 28-May-2023 20:35, PREVIOUS ECG IS PRESENT Confirmed by Edwyna Backers [phone]) on 02/07/2024 9:07:40 AM     Recent Labs: 07/05/2023: Magnesium  2.0 07/22/2023: ALT 27; BUN 13; Creatinine, Ser 0.55; Potassium 4.0; Sodium 137 08/01/2023: Hemoglobin 10.7; Platelets 231  Recent Lipid Panel    Component Value Date/Time   CHOL 133 01/27/2022 0403   TRIG 59 01/27/2022 0403   HDL 36 (L) 01/27/2022 0403   CHOLHDL 3.7 01/27/2022 0403   VLDL 12 01/27/2022 0403   LDLCALC 85 01/27/2022 0403    Physical Exam:    VS:  BP (!) 200/98   Ht 5' 6 (1.676 m)   Wt 114 lb 3.2 oz (51.8 kg)   SpO2 97%   BMI 18.43 kg/m     Wt Readings from Last 3 Encounters:  02/07/24 114 lb 3.2 oz (51.8 kg)  01/05/24 105 lb (47.6 kg)  10/02/23 100 lb (45.4 kg)     GEN: Patient is in no acute distress HEENT: Normal NECK: No JVD; No carotid bruits LYMPHATICS: No lymphadenopathy CARDIAC: Hear sounds regular, 2/6 systolic murmur at the apex. RESPIRATORY:  Clear to auscultation without rales, wheezing or rhonchi  ABDOMEN: Soft, non-tender, non-distended MUSCULOSKELETAL:  No edema; No deformity  SKIN: Warm and dry NEUROLOGIC:  Alert and oriented x 3 PSYCHIATRIC:  Normal affect   Signed, Backers JONELLE Edwyna, MD  02/07/2024 10:00 AM    [facility]     [1]  Current Meds  Medication Sig   alendronate (FOSAMAX) 70 MG tablet Take 70 mg by mouth once a week.   amLODipine  (NORVASC ) 5 MG tablet Take 1 tablet (5 mg total) by mouth daily.   aspirin  EC 81 MG tablet Take 1 tablet (81 mg total) by mouth daily. Swallow whole.   ezetimibe  (ZETIA ) 10 MG tablet Take 1 tablet (10 mg total) by mouth daily.   hydrALAZINE  (APRESOLINE ) 25 MG tablet Take 1 tablet (25 mg total) by mouth every 8 (eight) hours.   metoprolol  tartrate (LOPRESSOR ) 25 MG tablet Take 12.5 mg by mouth daily.   Multiple Vitamin (MULTIVITAMIN WITH MINERALS) TABS tablet Take 1 tablet by mouth daily.   pantoprazole  (PROTONIX ) 40 MG tablet Take 1 tablet (40 mg total) by mouth daily.   valsartan (DIOVAN) 160 MG tablet Take 160 mg by mouth daily.   "

## 2024-02-08 ENCOUNTER — Ambulatory Visit

## 2024-02-08 DIAGNOSIS — I10 Essential (primary) hypertension: Secondary | ICD-10-CM | POA: Diagnosis not present

## 2024-02-08 DIAGNOSIS — R0989 Other specified symptoms and signs involving the circulatory and respiratory systems: Secondary | ICD-10-CM

## 2024-02-08 LAB — CBC
Hematocrit: 44 % (ref 34.0–46.6)
Hemoglobin: 13.9 g/dL (ref 11.1–15.9)
MCH: 30 pg (ref 26.6–33.0)
MCHC: 31.6 g/dL (ref 31.5–35.7)
MCV: 95 fL (ref 79–97)
Platelets: 171 10*3/uL (ref 150–450)
RBC: 4.64 x10E6/uL (ref 3.77–5.28)
RDW: 12.3 % (ref 11.7–15.4)
WBC: 4.4 10*3/uL (ref 3.4–10.8)

## 2024-02-08 LAB — COMPREHENSIVE METABOLIC PANEL WITH GFR
ALT: 16 [IU]/L (ref 0–32)
AST: 18 [IU]/L (ref 0–40)
Albumin: 4.7 g/dL (ref 3.9–4.9)
Alkaline Phosphatase: 118 [IU]/L (ref 49–135)
BUN/Creatinine Ratio: 14 (ref 12–28)
BUN: 10 mg/dL (ref 8–27)
Bilirubin Total: 0.4 mg/dL (ref 0.0–1.2)
CO2: 20 mmol/L (ref 20–29)
Calcium: 9.9 mg/dL (ref 8.7–10.3)
Chloride: 107 mmol/L — ABNORMAL HIGH (ref 96–106)
Creatinine, Ser: 0.7 mg/dL (ref 0.57–1.00)
Globulin, Total: 2.8 g/dL (ref 1.5–4.5)
Glucose: 92 mg/dL (ref 70–99)
Potassium: 4.4 mmol/L (ref 3.5–5.2)
Sodium: 142 mmol/L (ref 134–144)
Total Protein: 7.5 g/dL (ref 6.0–8.5)
eGFR: 95 mL/min/{1.73_m2}

## 2024-02-08 LAB — LIPID PANEL
Chol/HDL Ratio: 4 ratio (ref 0.0–4.4)
Cholesterol, Total: 230 mg/dL — ABNORMAL HIGH (ref 100–199)
HDL: 58 mg/dL
LDL Chol Calc (NIH): 159 mg/dL — ABNORMAL HIGH (ref 0–99)
Triglycerides: 74 mg/dL (ref 0–149)
VLDL Cholesterol Cal: 13 mg/dL (ref 5–40)

## 2024-02-08 LAB — TSH: TSH: 0.997 u[IU]/mL (ref 0.450–4.500)

## 2024-02-08 MED ORDER — CO Q-10 200 MG PO CAPS: 200.0000 mg | ORAL_CAPSULE | Freq: Every day | ORAL | Status: AC

## 2024-02-08 MED ORDER — ROSUVASTATIN CALCIUM 20 MG PO TABS: 20.0000 mg | ORAL_TABLET | Freq: Every day | ORAL | 3 refills | Status: AC

## 2024-02-09 ENCOUNTER — Telehealth: Payer: Self-pay

## 2024-02-09 NOTE — Progress Notes (Unsigned)
 Complex Care Management Note Care Guide Note  02/09/2024 Name: Katherine Archer MRN: [id number] DOB: 18-Jun-1955   Complex Care Management Outreach Attempts: An unsuccessful telephone outreach was attempted today to offer the patient information about available complex care management services.  Follow Up Plan:  Additional outreach attempts will be made to offer the patient complex care management information and services.   Encounter Outcome:  Patient Request to Call Back  [name]  [facility], [facility] VBCI Assistant Direct Dial: [phone]  Fax: [phone]

## 2024-02-13 ENCOUNTER — Ambulatory Visit

## 2024-04-10 ENCOUNTER — Ambulatory Visit: Admitting: Cardiology

## 2024-07-10 ENCOUNTER — Encounter: Admitting: Physical Medicine and Rehabilitation
